# Patient Record
Sex: Female | Born: 1935 | Race: White | Hispanic: No | Smoking: Never smoker
Health system: Southern US, Community
[De-identification: ages and names within clinical notes are randomized; demographics above are authoritative.]

## PROBLEM LIST (undated history)

## (undated) DIAGNOSIS — Z950 Presence of cardiac pacemaker: Secondary | ICD-10-CM

## (undated) DIAGNOSIS — I4891 Unspecified atrial fibrillation: Secondary | ICD-10-CM

## (undated) DIAGNOSIS — D689 Coagulation defect, unspecified: Secondary | ICD-10-CM

## (undated) DIAGNOSIS — I499 Cardiac arrhythmia, unspecified: Secondary | ICD-10-CM

## (undated) DIAGNOSIS — I4892 Unspecified atrial flutter: Secondary | ICD-10-CM

## (undated) DIAGNOSIS — T7840XA Allergy, unspecified, initial encounter: Secondary | ICD-10-CM

## (undated) DIAGNOSIS — IMO0002 Reserved for concepts with insufficient information to code with codable children: Secondary | ICD-10-CM

## (undated) DIAGNOSIS — I2510 Atherosclerotic heart disease of native coronary artery without angina pectoris: Secondary | ICD-10-CM

## (undated) DIAGNOSIS — F32A Depression, unspecified: Secondary | ICD-10-CM

## (undated) DIAGNOSIS — R200 Anesthesia of skin: Secondary | ICD-10-CM

## (undated) DIAGNOSIS — R0600 Dyspnea, unspecified: Secondary | ICD-10-CM

## (undated) DIAGNOSIS — G459 Transient cerebral ischemic attack, unspecified: Secondary | ICD-10-CM

## (undated) DIAGNOSIS — I10 Essential (primary) hypertension: Secondary | ICD-10-CM

## (undated) DIAGNOSIS — F419 Anxiety disorder, unspecified: Secondary | ICD-10-CM

## (undated) DIAGNOSIS — I482 Chronic atrial fibrillation, unspecified: Secondary | ICD-10-CM

## (undated) DIAGNOSIS — G629 Polyneuropathy, unspecified: Secondary | ICD-10-CM

## (undated) DIAGNOSIS — C4490 Unspecified malignant neoplasm of skin, unspecified: Secondary | ICD-10-CM

## (undated) DIAGNOSIS — I509 Heart failure, unspecified: Secondary | ICD-10-CM

## (undated) HISTORY — DX: Heart failure, unspecified: I50.9

## (undated) HISTORY — PX: BREAST SURGERY: SHX581

## (undated) HISTORY — DX: Coagulation defect, unspecified: D68.9

## (undated) HISTORY — PX: OTHER SURGICAL HISTORY: SHX169

## (undated) HISTORY — DX: Allergy, unspecified, initial encounter: T78.40XA

## (undated) HISTORY — PX: ABDOMINAL HYSTERECTOMY: SHX81

## (undated) HISTORY — PX: CHOLECYSTECTOMY: SHX55

## (undated) HISTORY — PX: APPENDECTOMY: SHX54

## (undated) HISTORY — DX: Anesthesia of skin: R20.0

## (undated) HISTORY — DX: Chronic atrial fibrillation, unspecified: I48.20

## (undated) HISTORY — DX: Depression, unspecified: F32.A

## (undated) HISTORY — DX: Polyneuropathy, unspecified: G62.9

---
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
## 1997-10-22 ENCOUNTER — Other Ambulatory Visit: Admission: RE | Admit: 1997-10-22 | Discharge: 1997-10-22 | Payer: Self-pay | Admitting: Internal Medicine

## 2000-07-26 ENCOUNTER — Emergency Department (HOSPITAL_COMMUNITY): Admission: EM | Admit: 2000-07-26 | Discharge: 2000-07-26 | Payer: Self-pay | Admitting: Emergency Medicine

## 2000-07-26 ENCOUNTER — Encounter: Payer: Self-pay | Admitting: Emergency Medicine

## 2002-03-19 DIAGNOSIS — I2510 Atherosclerotic heart disease of native coronary artery without angina pectoris: Secondary | ICD-10-CM

## 2002-03-19 HISTORY — DX: Atherosclerotic heart disease of native coronary artery without angina pectoris: I25.10

## 2003-05-11 ENCOUNTER — Emergency Department (HOSPITAL_COMMUNITY): Admission: EM | Admit: 2003-05-11 | Discharge: 2003-05-11 | Payer: Self-pay | Admitting: Emergency Medicine

## 2004-02-13 ENCOUNTER — Ambulatory Visit: Admission: RE | Admit: 2004-02-13 | Discharge: 2004-02-13 | Payer: Self-pay | Admitting: Internal Medicine

## 2004-05-12 ENCOUNTER — Inpatient Hospital Stay (HOSPITAL_COMMUNITY): Admission: EM | Admit: 2004-05-12 | Discharge: 2004-05-13 | Payer: Self-pay | Admitting: *Deleted

## 2004-05-12 ENCOUNTER — Encounter (INDEPENDENT_AMBULATORY_CARE_PROVIDER_SITE_OTHER): Payer: Self-pay | Admitting: Cardiology

## 2005-01-30 ENCOUNTER — Inpatient Hospital Stay (HOSPITAL_COMMUNITY): Admission: EM | Admit: 2005-01-30 | Discharge: 2005-02-01 | Payer: Self-pay | Admitting: Emergency Medicine

## 2005-07-12 ENCOUNTER — Inpatient Hospital Stay (HOSPITAL_COMMUNITY): Admission: EM | Admit: 2005-07-12 | Discharge: 2005-07-16 | Payer: Self-pay | Admitting: *Deleted

## 2005-07-13 ENCOUNTER — Encounter (INDEPENDENT_AMBULATORY_CARE_PROVIDER_SITE_OTHER): Payer: Self-pay | Admitting: *Deleted

## 2005-08-05 ENCOUNTER — Inpatient Hospital Stay (HOSPITAL_COMMUNITY): Admission: AD | Admit: 2005-08-05 | Discharge: 2005-08-10 | Payer: Self-pay | Admitting: Cardiology

## 2005-08-06 ENCOUNTER — Encounter (INDEPENDENT_AMBULATORY_CARE_PROVIDER_SITE_OTHER): Payer: Self-pay | Admitting: *Deleted

## 2005-08-06 HISTORY — PX: CARDIAC CATHETERIZATION: SHX172

## 2005-10-27 ENCOUNTER — Ambulatory Visit (HOSPITAL_COMMUNITY): Admission: RE | Admit: 2005-10-27 | Discharge: 2005-10-27 | Payer: Self-pay | Admitting: Cardiology

## 2006-01-19 ENCOUNTER — Ambulatory Visit (HOSPITAL_COMMUNITY): Admission: RE | Admit: 2006-01-19 | Discharge: 2006-01-19 | Payer: Self-pay | Admitting: Cardiology

## 2006-03-18 ENCOUNTER — Inpatient Hospital Stay (HOSPITAL_COMMUNITY): Admission: EM | Admit: 2006-03-18 | Discharge: 2006-03-23 | Payer: Self-pay | Admitting: Emergency Medicine

## 2007-05-16 ENCOUNTER — Encounter: Payer: Self-pay | Admitting: Gastroenterology

## 2008-03-19 ENCOUNTER — Inpatient Hospital Stay (HOSPITAL_COMMUNITY): Admission: EM | Admit: 2008-03-19 | Discharge: 2008-03-22 | Payer: Self-pay | Admitting: Emergency Medicine

## 2008-06-17 ENCOUNTER — Encounter: Payer: Self-pay | Admitting: Gastroenterology

## 2008-07-19 ENCOUNTER — Ambulatory Visit: Payer: Self-pay | Admitting: Gastroenterology

## 2008-09-03 DIAGNOSIS — E782 Mixed hyperlipidemia: Secondary | ICD-10-CM | POA: Insufficient documentation

## 2008-09-03 DIAGNOSIS — M1990 Unspecified osteoarthritis, unspecified site: Secondary | ICD-10-CM | POA: Insufficient documentation

## 2008-09-03 DIAGNOSIS — I10 Essential (primary) hypertension: Secondary | ICD-10-CM | POA: Insufficient documentation

## 2008-09-03 DIAGNOSIS — N6019 Diffuse cystic mastopathy of unspecified breast: Secondary | ICD-10-CM | POA: Insufficient documentation

## 2008-09-03 DIAGNOSIS — K219 Gastro-esophageal reflux disease without esophagitis: Secondary | ICD-10-CM | POA: Insufficient documentation

## 2008-09-03 DIAGNOSIS — I2510 Atherosclerotic heart disease of native coronary artery without angina pectoris: Secondary | ICD-10-CM | POA: Insufficient documentation

## 2008-09-04 ENCOUNTER — Ambulatory Visit: Payer: Self-pay | Admitting: Gastroenterology

## 2008-09-10 ENCOUNTER — Ambulatory Visit: Payer: Self-pay | Admitting: Gastroenterology

## 2008-12-26 DIAGNOSIS — R200 Anesthesia of skin: Secondary | ICD-10-CM

## 2008-12-26 HISTORY — DX: Anesthesia of skin: R20.0

## 2009-06-20 ENCOUNTER — Inpatient Hospital Stay (HOSPITAL_COMMUNITY): Admission: EM | Admit: 2009-06-20 | Discharge: 2009-06-22 | Payer: Self-pay | Admitting: Emergency Medicine

## 2009-08-28 ENCOUNTER — Ambulatory Visit (HOSPITAL_COMMUNITY): Admission: RE | Admit: 2009-08-28 | Discharge: 2009-08-28 | Payer: Self-pay | Admitting: Cardiovascular Disease

## 2009-09-03 ENCOUNTER — Encounter: Payer: Self-pay | Admitting: Internal Medicine

## 2009-09-10 ENCOUNTER — Encounter (INDEPENDENT_AMBULATORY_CARE_PROVIDER_SITE_OTHER): Payer: Self-pay | Admitting: *Deleted

## 2009-09-15 ENCOUNTER — Telehealth (INDEPENDENT_AMBULATORY_CARE_PROVIDER_SITE_OTHER): Payer: Self-pay | Admitting: *Deleted

## 2009-10-29 DIAGNOSIS — I509 Heart failure, unspecified: Secondary | ICD-10-CM | POA: Insufficient documentation

## 2009-10-29 HISTORY — DX: Heart failure, unspecified: I50.9

## 2009-11-05 ENCOUNTER — Encounter: Admission: RE | Admit: 2009-11-05 | Discharge: 2009-11-05 | Payer: Self-pay | Admitting: Cardiology

## 2009-11-19 ENCOUNTER — Ambulatory Visit (HOSPITAL_COMMUNITY): Admission: RE | Admit: 2009-11-19 | Discharge: 2009-11-19 | Payer: Self-pay | Admitting: Cardiology

## 2009-11-19 HISTORY — PX: CARDIOVERSION: SHX1299

## 2010-06-04 NOTE — Miscellaneous (Signed)
Summary: Device change out  Clinical Lists Changes  Observations: Added new observation of PPM INDICATN: Sick sinus syndrome (09/03/2009 13:11) Added new observation of MAGNET RTE: BOL 85 ERI 65 (09/03/2009 13:11) Added new observation of PPMLEADSTAT2: active (09/03/2009 13:11) Added new observation of PPMLEADSER2: [id number] (09/03/2009 13:11) Added new observation of PPMLEADMOD2: 4092  (09/03/2009 13:11) Added new observation of PPMLEADDOI2: 03/22/2006  (09/03/2009 13:11) Added new observation of PPMLEADLOC2: RV  (09/03/2009 13:11) Added new observation of PPMLEADSTAT1: active  (09/03/2009 13:11) Added new observation of PPMLEADSER1: [id number]  (09/03/2009 13:11) Added new observation of PPMLEADMOD1: 4592  (09/03/2009 13:11) Added new observation of PPMLEADDOI1: 03/22/2006  (09/03/2009 13:11) Added new observation of PPMLEADLOC1: RA  (09/03/2009 13:11) Added new observation of PPM IMP MD: Sherryl Manges, MD  (09/03/2009 13:11) Added new observation of PPM DOI: 08/28/2009  (09/03/2009 13:11) Added new observation of PPM SERL#: [id number]  (09/03/2009 13:11) Added new observation of PPM MODL#: [id number]  (09/03/2009 08:65) Added new observation of PACEMAKERMFG: Medtronic  (09/03/2009 13:11) Added new observation of PACEMAKER MD: Sherryl Manges, MD  (09/03/2009 13:11)      PPM Specifications Following MD:  Sherryl Manges, MD     PPM Vendor:  Medtronic     PPM Model Number:  [id number]     PPM Serial Number:  [id number] PPM DOI:  08/28/2009     PPM Implanting MD:  Sherryl Manges, MD  Lead 1    Location: RA     DOI: 03/22/2006     Model #: [id number]     Serial #: [id number]     Status: active Lead 2    Location: RV     DOI: 03/22/2006     Model #: [id number]     Serial #: [id number]     Status: active  Magnet Response Rate:  BOL 85 ERI 65  Indications:  Sick sinus syndrome

## 2010-06-04 NOTE — Letter (Signed)
Summary: Device-Delinquent Check  [facility], Main Office  [address]   [place], [place] [address]   Phone: [phone]  Fax: [phone]     Sep 10, 2009 MRN: [id number]   [facility] [address] [place], [place]  [address]   Dear Ms. Eid,  According to our records, you have not had your implanted device checked in the recommended period of time.  We are unable to determine appropriate device function without checking your device on a regular basis.  Please call our office to schedule an appointment as soon as possible.  If you are having your device checked by another physician, please call us so that we may update our records.  Thank you,  Altha Harm, LPN  Sep 10, 2009 8:13 AM  [facility]

## 2010-06-04 NOTE — Progress Notes (Signed)
  Phone Note Other Incoming   Summary of Call: Pt called to let us know she gets device checked at [facility].   Initial call taken by: Vella Kohler,  Sep 15, 2009 1:58 PM

## 2010-07-18 LAB — PROTIME-INR
INR: 2.31 — ABNORMAL HIGH (ref 0.00–1.49)
Prothrombin Time: 25.2 seconds — ABNORMAL HIGH (ref 11.6–15.2)

## 2010-07-21 LAB — PROTIME-INR: INR: 1.12 (ref 0.00–1.49)

## 2010-07-22 LAB — CARDIAC PANEL(CRET KIN+CKTOT+MB+TROPI)
CK, MB: 1 ng/mL (ref 0.3–4.0)
Relative Index: INVALID (ref 0.0–2.5)
Relative Index: INVALID (ref 0.0–2.5)
Total CK: 48 U/L (ref 7–177)
Troponin I: 0.01 ng/mL (ref 0.00–0.06)

## 2010-07-22 LAB — CBC
HCT: 41.6 % (ref 36.0–46.0)
HCT: 42.3 % (ref 36.0–46.0)
Hemoglobin: 13.2 g/dL (ref 12.0–15.0)
Hemoglobin: 14.3 g/dL (ref 12.0–15.0)
MCHC: 33.2 g/dL (ref 30.0–36.0)
MCV: 95.4 fL (ref 78.0–100.0)
Platelets: 255 10*3/uL (ref 150–400)
Platelets: 285 10*3/uL (ref 150–400)
Platelets: 293 10*3/uL (ref 150–400)
RDW: 13.5 % (ref 11.5–15.5)
RDW: 13.5 % (ref 11.5–15.5)
WBC: 9.3 10*3/uL (ref 4.0–10.5)

## 2010-07-22 LAB — COMPREHENSIVE METABOLIC PANEL
ALT: 18 U/L (ref 0–35)
AST: 27 U/L (ref 0–37)
Albumin: 3.2 g/dL — ABNORMAL LOW (ref 3.5–5.2)
Albumin: 3.7 g/dL (ref 3.5–5.2)
Alkaline Phosphatase: 77 U/L (ref 39–117)
Alkaline Phosphatase: 99 U/L (ref 39–117)
BUN: 10 mg/dL (ref 6–23)
Calcium: 8.8 mg/dL (ref 8.4–10.5)
Chloride: 104 mEq/L (ref 96–112)
Creatinine, Ser: 0.73 mg/dL (ref 0.4–1.2)
Creatinine, Ser: 0.77 mg/dL (ref 0.4–1.2)
GFR calc Af Amer: >60 mL/min (ref >60)
GFR calc Af Amer: >60 mL/min (ref >60)
GFR calc non Af Amer: >60 mL/min (ref >60)
Glucose, Bld: 100 mg/dL — ABNORMAL HIGH (ref 70–99)
Glucose, Bld: 108 mg/dL — ABNORMAL HIGH (ref 70–99)
Potassium: 3 mEq/L — ABNORMAL LOW (ref 3.5–5.1)
Sodium: 139 mEq/L (ref 135–145)
Sodium: 139 mEq/L (ref 135–145)
Total Bilirubin: 0.5 mg/dL (ref 0.3–1.2)
Total Bilirubin: 0.5 mg/dL (ref 0.3–1.2)
Total Protein: 6.2 g/dL (ref 6.0–8.3)
Total Protein: 6.6 g/dL (ref 6.0–8.3)
Total Protein: 7.2 g/dL (ref 6.0–8.3)

## 2010-07-22 LAB — DIFFERENTIAL
Lymphocytes Relative: 31 % (ref 12–46)
Lymphs Abs: 2.7 10*3/uL (ref 0.7–4.0)
Monocytes Absolute: 1 10*3/uL (ref 0.1–1.0)
Neutrophils Relative %: 55 % (ref 43–77)

## 2010-07-22 LAB — PROTIME-INR
INR: 1.87 — ABNORMAL HIGH (ref 0.00–1.49)
INR: 1.91 — ABNORMAL HIGH (ref 0.00–1.49)
Prothrombin Time: 21.1 seconds — ABNORMAL HIGH (ref 11.6–15.2)
Prothrombin Time: 21.7 seconds — ABNORMAL HIGH (ref 11.6–15.2)

## 2010-07-22 LAB — APTT: aPTT: 33 seconds (ref 24–37)

## 2010-07-22 LAB — LIPID PANEL
HDL: 34 mg/dL — ABNORMAL LOW (ref >39)
HDL: 36 mg/dL — ABNORMAL LOW (ref >39)
Triglycerides: 93 mg/dL (ref <150)
VLDL: 19 mg/dL (ref 0–40)
VLDL: 20 mg/dL (ref 0–40)

## 2010-07-22 LAB — MAGNESIUM: Magnesium: 2.2 mg/dL (ref 1.5–2.5)

## 2010-07-22 LAB — TSH: TSH: 0.895 u[IU]/mL (ref 0.350–4.500)

## 2010-08-25 ENCOUNTER — Emergency Department (HOSPITAL_COMMUNITY): Payer: Medicare Other

## 2010-08-25 ENCOUNTER — Observation Stay (HOSPITAL_COMMUNITY)
Admission: EM | Admit: 2010-08-25 | Discharge: 2010-08-26 | Disposition: A | Discharge status: Home / Self Care | Payer: Medicare Other | Attending: Cardiology | Admitting: Cardiology

## 2010-08-25 DIAGNOSIS — I4891 Unspecified atrial fibrillation: Secondary | ICD-10-CM | POA: Insufficient documentation

## 2010-08-25 DIAGNOSIS — Z950 Presence of cardiac pacemaker: Secondary | ICD-10-CM | POA: Insufficient documentation

## 2010-08-25 DIAGNOSIS — Z7901 Long term (current) use of anticoagulants: Secondary | ICD-10-CM | POA: Insufficient documentation

## 2010-08-25 DIAGNOSIS — Z8673 Personal history of transient ischemic attack (TIA), and cerebral infarction without residual deficits: Secondary | ICD-10-CM | POA: Insufficient documentation

## 2010-08-25 DIAGNOSIS — E785 Hyperlipidemia, unspecified: Secondary | ICD-10-CM | POA: Insufficient documentation

## 2010-08-25 DIAGNOSIS — I517 Cardiomegaly: Secondary | ICD-10-CM | POA: Insufficient documentation

## 2010-08-25 DIAGNOSIS — R079 Chest pain, unspecified: Principal | ICD-10-CM | POA: Insufficient documentation

## 2010-08-25 DIAGNOSIS — F410 Panic disorder [episodic paroxysmal anxiety] without agoraphobia: Secondary | ICD-10-CM | POA: Insufficient documentation

## 2010-08-25 DIAGNOSIS — J438 Other emphysema: Secondary | ICD-10-CM | POA: Insufficient documentation

## 2010-08-25 DIAGNOSIS — I495 Sick sinus syndrome: Secondary | ICD-10-CM | POA: Insufficient documentation

## 2010-08-25 LAB — POCT CARDIAC MARKERS
CKMB, poc: <1 ng/mL — ABNORMAL LOW (ref 1.0–8.0)
Troponin i, poc: <0.05 ng/mL (ref 0.00–0.09)

## 2010-08-25 LAB — APTT: aPTT: 30 seconds (ref 24–37)

## 2010-08-25 LAB — HEPATIC FUNCTION PANEL
Albumin: 3.9 g/dL (ref 3.5–5.2)
Alkaline Phosphatase: 119 U/L — ABNORMAL HIGH (ref 39–117)
Total Bilirubin: 0.8 mg/dL (ref 0.3–1.2)

## 2010-08-25 LAB — CBC
MCH: 31.5 pg (ref 26.0–34.0)
MCV: 92.6 fL (ref 78.0–100.0)
Platelets: 248 10*3/uL (ref 150–400)
RDW: 13.4 % (ref 11.5–15.5)

## 2010-08-25 LAB — DIFFERENTIAL
Eosinophils Absolute: 0.1 10*3/uL (ref 0.0–0.7)
Eosinophils Relative: 1 % (ref 0–5)
Lymphs Abs: 5.3 10*3/uL — ABNORMAL HIGH (ref 0.7–4.0)
Monocytes Absolute: 1 10*3/uL (ref 0.1–1.0)
Monocytes Relative: 8 % (ref 3–12)

## 2010-08-25 LAB — POCT I-STAT, CHEM 8
BUN: 14 mg/dL (ref 6–23)
Calcium, Ion: 1 mmol/L — ABNORMAL LOW (ref 1.12–1.32)
Chloride: 103 mEq/L (ref 96–112)
Glucose, Bld: 109 mg/dL — ABNORMAL HIGH (ref 70–99)

## 2010-08-25 LAB — DIGOXIN LEVEL: Digoxin Level: 1 ng/mL (ref 0.8–2.0)

## 2010-08-26 LAB — TSH: TSH: 0.672 u[IU]/mL (ref 0.350–4.500)

## 2010-08-26 LAB — BASIC METABOLIC PANEL
BUN: 11 mg/dL (ref 6–23)
CO2: 30 mEq/L (ref 19–32)
Chloride: 103 mEq/L (ref 96–112)
Creatinine, Ser: 0.77 mg/dL (ref 0.4–1.2)
Glucose, Bld: 98 mg/dL (ref 70–99)

## 2010-08-26 LAB — CBC
HCT: 41.8 % (ref 36.0–46.0)
MCHC: 33.5 g/dL (ref 30.0–36.0)
MCV: 92.5 fL (ref 78.0–100.0)
RDW: 13.4 % (ref 11.5–15.5)

## 2010-08-26 LAB — PROTIME-INR: INR: 1.83 — ABNORMAL HIGH (ref 0.00–1.49)

## 2010-09-07 NOTE — Discharge Summary (Signed)
Misty Blackwell, Misty Blackwell                ACCOUNT NO.:  [id number]  MEDICAL RECORD NO.:  [id number]           PATIENT TYPE:  O  LOCATION:  3704                         FACILITY:  [facility]  PHYSICIAN:  Dr. Rennis Golden              DATE OF BIRTH:  01-05-1928  DATE OF ADMISSION:  08/25/2010 DATE OF DISCHARGE:  08/26/2010                              DISCHARGE SUMMARY   DISCHARGE DIAGNOSES: 1. Chest pain, negative cardiac enzymes x3. 2. Anxiety attack. 3. History of paroxysmal atrial fibrillation, sick sinus syndrome. 4. History of pacemaker implant in 2007 with generator change in April     2011 to Medtronic device. 5. Intolerance to AMIODARONE. 6. Coumadin therapy. 7. Past transient ischemic attack. 8. History of dyslipidemia. 9. History of preserved left ventricular function.  HOSPITAL COURSE:  Misty Blackwell is an 77 year old female who was seen by Dr. Clarene Duke and Dr. Milinda Cave.  She has a history of paroxysmal atrial fibrillation, sick sinus syndrome, pacemaker implantation in 2007, generator change to Medtronic device in April 2011.  She is intolerant to AMIODARONE.  She has a history of prior TIAs.  She also takes Coumadin.  The patient came in to [facility] ED, appearing anxious and depressed due to issues at home.  She also complained of chest pressure. Initial cardiac enzymes were negative and her EKG showed a paced rhythm. She was admitted for observation and to cycle cardiac enzymes.  She is currently chest pain free.  Chest x-ray showed stable cardiomegaly, COPD/emphysema.  No acute cardiopulmonary disease.  She is currently chest pain free.  She been seen by Dr. Rennis Golden, feels she is stable for discharge and for followup with Dr. Clarene Duke and also recommended that she follows up with her primary care doctor regarding her anxiety.  DISCHARGE LABORATORY FINDINGS:  WBC is 9.1, hemoglobin 14.4, hematocrit 41.8, platelets 214.  PT was 21.3, INR 1.83.  Sodium 143, potassium 3.6, chloride 103,  carbon dioxide 30, glucose 98, BUN 11, creatinine 0.77, total bilirubin was 0.8, direct bilirubin was 0.2, indirect 0.6, alkaline phosphatase was 1.19, AST 26, ALT 25, total protein 6.9, albumin 3.9, calcium 8.8, magnesium 2.0, CK-MB negative x3.  Thyroid stimulating hormone 0.672 and digoxin level was 1.0.  STUDIES/PROCEDURES:  Chest x-ray showed stable cardiomegaly. COPD/emphysema.  No acute cardiopulmonary disease.  DISCHARGE MEDICATIONS: 1. Nitroglycerin sublingual 0.4 mg 1 tablet under the tongue every 5     minutes up to 3 doses total for chest pain. 2. Alprazolam one and half to one tablet up to 2 tablets by mouth     daily at bedtime as needed for anxiety. 3. Aspirin 81 mg 1 tablet by mouth every morning. 4. Bystolic 10 mg one-half tablet by mouth every morning. 5. Digoxin 0.125 mg 1 tablet by mouth every morning. 6. Diltiazem CD 360 mg 1 capsule by mouth every morning. 7. Fish oil 1 tablet by mouth daily. 8. Furosemide 40 mg 2 tablets by mouth every morning. 9. Klor-Con 20 mEq 1 tablet by mouth daily. 10.Pravachol 40 mg one-half tablet by mouth daily at bedtime. 11.Vitamin D 4000 units  3 capsules by mouth daily. 12.Warfarin 6 mg one-half tablet by mouth every evening.  DISPOSITION:  Misty Blackwell discharged home in stable condition.  Recommend she increase her activity slowly and eat a heart-healthy diet.  She will follow up with Dr. Clarene Duke in approximately 1-2 weeks.  Recommended she follows up with her primary care doctor regarding her anxiety.    ______________________________ Wilburt Finlay, PA   ______________________________ Dr. Rennis Golden    BH/MEDQ  D:  08/26/2010  T:  08/27/2010  Job:  [id number]  cc:   Dr. Milinda Cave  Electronically Signed by Wilburt Finlay PA on 08/28/2010 11:24:17 AM Electronically Signed by Kirtland Bouchard. HILTY M.D. on 09/07/2010 12:36:49 PM

## 2010-09-08 NOTE — H&P (Signed)
NAMEJORDIE, Misty Blackwell                ACCOUNT NO.:  [id number]  MEDICAL RECORD NO.:  [id number]           PATIENT TYPE:  E  LOCATION:  [facility]                         FACILITY:  [facility]  PHYSICIAN:  Dr. Allyson Sabal              DATE OF BIRTH:  02/11/1928  DATE OF ADMISSION:  08/25/2010 DATE OF DISCHARGE:                             HISTORY & PHYSICAL   CHIEF COMPLAINTS:  Chest pressure.  HISTORY OF PRESENT ILLNESS:  Ms. Bugh is an 77 year old female followed by Dr. Clarene Duke and Dr. Michele Mcalpine.  She has a history of paroxysmal atrial fibrillation and sick sinus syndrome.  She had a pacemaker implanted in 2007.  She had a generator change with a Medtronic device in April 2011.  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  She is on Coumadin.  She has had prior TIAs.  She is now off amiodarone, but seems to be doing well.  When she saw Dr. Clarene Duke in February, she had some brief runs of atrial fibrillation associated with stress.  She is admitted to the emergency room tonight with chest pressure.  She was at home visiting with a friend, who came to visit her because Ms. Zettler has been depressed and anxious lately.  They did not go into details, but apparently there were some issues at home, her husband has been in an accident for the fourth time this year already.  The patient became upset and tearful and started "shaking all over."  She then complained of chest pressure.  She was brought to the emergency room.  Her enzymes were negative.  Her EKG shows a paced rhythm.  She is currently chest pain-free, but feels "washed out."  We will go ahead and admit her overnight for further observation.  PAST MEDICAL HISTORY:  Remarkable for previous catheterizations in 1993 and November 2007 that showed a minor RCA disease and some systolic kinking in the LAD with no significant coronary disease.  She has had good LV function in the past.   She has a history of prior TIAs.  She has dyslipidemia.  She has had normal LV function in the past.  She has had a cholecystectomy in 2007.  She has had previous problems with anxiety, depression, she says she has been intolerant to antidepressant medicines.  CURRENT MEDICATIONS:  As per Dr. Fredirick Maudlin office note from February 2012, 1. Coumadin. 2. Aspirin 81 mg a day. 3. Bystolic. 4. Cardizem 360 mg a day. 5. Lanoxin 0.125 mg a day. 6. Xanax p.r.n. 7. Pravastatin 20 mg a day. 8. Lasix p.r.n. 9. Potassium 20 mEq a day. 10.Vitamin D. 11.Fish oil. 12.Over-the-counter iron.  ALLERGIES:  She has no known drug allergies.  SHE WAS INTOLERANT TO AMIODARONE, WHICH CAUSED ATAXIA.  SHE ALSO SAYS SHE IS INTOLERANT TO ANTIDEPRESSANTS.  SOCIAL HISTORY:  She is married, they have no children.  She is a nonsmoker and nondrinker.  FAMILY HISTORY:  Remarkable that her sister has had a pacemaker.  One sister died of cirrhosis.  Both her parents apparently  had some coronary disease.  REVIEW OF SYSTEMS:  Essentially unremarkable except for noted above.  PHYSICAL EXAM:  VITAL SIGNS:  Blood pressure 136/80, pulse 60, temperature 97.4. GENERAL:  She is a well-developed elderly female, who is anxious, but in no acute distress. HEENT:  Normocephalic.  Extraocular movements are intact.  Sclerae was nonicteric.  Lids and conjunctivae within normal limits. NECK:  Without JVD or bruit. CHEST:  Clear to auscultation and percussion. CARDIAC:  Reveals regular rate and rhythm without murmur, rub, or gallop.  Normal S1 and S2. ABDOMEN:  Nontender and nondistended. EXTREMITIES:  Without edema.  Distal pulses are 2+/4 bilaterally. NEURO:  Grossly intact.  She is awake, alert, oriented, and cooperative. Moves all extremities without obvious deficit. SKIN:  Cool and dry.  LABORATORY DATA:  White count 11.7, hemoglobin 15, hematocrit 44.1, platelets 248, INR 1.79.  Sodium 142, potassium 3.3, BUN 15,  creatinine 0.9, troponins were negative x2.  Chest x-ray shows COPD, but no acute process.  EKG shows atrial fibrillation with V pacing.  IMPRESSION: 1. Chest pressure, rule out cardiac. 2. Anxiety attack. 3. History of paroxysmal atrial fibrillation and sick sinus syndrome. 4. History of pacemaker implant in 2007 with a generator change, April     2011 with Medtronic device. 5. Amiodarone intolerance. 6. Coumadin therapy. 7. Past transient ischemic attack. 8. History of dyslipidemia. 9. History of preserved LV function.  PLAN:  The patient was seen by Dr. Allyson Sabal and myself today in the emergency room.  We will go ahead and check another set of enzymes in the morning.  She will be admitted for 24-hour observation.     Abelino Derrick, P.A.   ______________________________ Dr. Evalee Mutton  D:  08/25/2010  T:  08/25/2010  Job:  [id number]  cc:   Thereasa Solo. Little, M.D.  Electronically Signed by Corine Shelter P.A. on 09/02/2010 04:37:08 PM Electronically Signed by Nanetta Batty M.D. on 09/08/2010 07:39:57 AM

## 2010-09-15 NOTE — Discharge Summary (Signed)
Misty Blackwell, PROUD                ACCOUNT NO.:  [id number]   MEDICAL RECORD NO.:  [id number]          PATIENT TYPE:  INP   LOCATION:  5532                         FACILITY:  [facility]   PHYSICIAN:  Elliot Cousin, M.D.    DATE OF BIRTH:  [birth year]   DATE OF ADMISSION:  03/18/2008  DATE OF DISCHARGE:                               DISCHARGE SUMMARY   DISCHARGE DIAGNOSES:  1. Multifocal pneumonia.  2. Hypokalemia.  3. Chronic atrial fibrillation.  4. Hypertension.   DISCHARGE MEDICATIONS:  1. Tamiflu 75 mg b.i.d. for one more day.  2. Ceftin 500 mg b.i.d. for four more days.  3. Azithromycin 500 mg daily for four more days.  4. Potassium chloride 20 mEq daily.  5. Coumadin 3 mg every day except on Tuesday and Saturday take 6 mg.  6. Amlodipine 10 mg daily.  7. Aspirin 81 mg daily.  8. Sotalol 160 mg b.i.d.  9. Alprazolam 0.5 mg q.h.s.  10.Fish oil 1000 mg b.i.d.  11.Niacin 500 mg daily.   DISCHARGE DISPOSITION:  The patient is approaching medical stability.  The plan is to discharge her to home tomorrow on March 22, 2008 if  she remains stable and in improved condition overnight.  The patient was  advised to follow up with her primary care physician, Dr. Oneta Rack, in 1  week and with Dr. Julieanne Manson in 1-2 weeks.   CONSULTATIONS:  None.   PROCEDURES PERFORMED:  1. Chest x-ray on March 21, 2008.  The results revealed improved      nodular air space opacities.  2. Chest x-ray on March 18, 2008.  The results revealed patchy      bilateral opacities, most consistent with multifocal pneumonia.      Recommend radiographic follow up in 4-6 weeks.   HISTORY OF PRESENT ILLNESS:  The patient is a 77 year old woman with a  past medical history significant for chronic atrial fibrillation,  tachybrady syndrome, status post pacemaker, and hypertension.  She  presented to the emergency department on March 18, 2008 with a chief  complaint of shortness of breath and fever.  When  she was evaluated in  the emergency department, she was noted to be febrile with a temperature  of 102.3.  Her white blood cell count was elevated at 16.8.  The chest x-  ray revealed bilateral patchy opacities consistent with multifocal  pneumonia.  The patient was therefore admitted for further evaluation  and management.   For additional details please see the dictated history and physical.   HOSPITAL COURSE:  1. MULTIFOCAL PNEUMONIA.  Blood cultures were ordered.  The patient      was placed on droplet precautions.  Antibiotic treatment was      started with Rocephin and azithromycin.  Empiric antiviral      treatment was started with Tamiflu.  In addition to blood cultures,      a sputum culture was ordered.  However, the specimen that the      patient provided was not representative of the lower respiratory      tract.  The patient could  provide no further sputum although she      tried.  Her blood cultures remained negative during the      hospitalization. She became afebrile and remained afebrile for the      majority of the hospitalization.  Her white blood cell count has      improved to 11.4.  She is currently symptomatically improved.  A      follow-up chest x-ray today revealed some resolution of the air      space opacities.  A follow-up chest x-ray may be warranted in 4-6      weeks to assess for complete resolution.  As of today, she has      completed 3 days of therapy with Rocephin, azithromycin, and      Tamiflu.  The plan is to discharge her to home tomorrow on four      more days of therapy with Ceftin and azithromycin and one more day      with Tamiflu (after receiving tomorrow's treatment).  She is      currently oxygenating 93% on room air.  2. CHRONIC ATRIAL FIBRILLATION.  The  patient was maintained on      sotalol and Coumadin.  The pharmacist assisted with dosing of the      Coumadin.  Per the pharmacist's recommendation, the patient will be      discharged  to home on a slightly different regimen of 3 mg every      day except 6 mg on Tuesdays and Saturdays.  The patient was advised      to follow up with Dr. Clarene Duke accordingly.  As of today, her INR is      1.8.  Her heart rate has been well-controlled during the      hospitalization.  3. HYPERTENSION.  The patient's blood pressure has been well-      controlled during the entire hospitalization.  4. HYPOKALEMIA. The patient's serum potassium was 3.3 at the time of      the initial hospital assessment.  She was started on potassium      chloride repletion orally.  As of today, her serum potassium has      improved marginally to 3.4.  She will receive two doses today and      will be discharged home on daily potassium supplementation.  A      magnesium level was ordered and it was within normal limits at 2.1.   DISCHARGE LABORATORY DATA:  TSH 0.365 (0.350 to 4.5 within normal  limits).  Total cholesterol 181, triglycerides 82, HDL 38, LDL 127.  Urine culture insignificant growth.      Elliot Cousin, M.D.  Electronically Signed     DF/MEDQ  D:  03/21/2008  T:  03/21/2008  Job:  [id number]   cc:   Thereasa Solo. Little, M.D.  Lucky Cowboy, M.D.

## 2010-09-15 NOTE — H&P (Signed)
Misty Blackwell, Misty Blackwell                ACCOUNT NO.:  [id number]   MEDICAL RECORD NO.:  [id number]          PATIENT TYPE:  INP   LOCATION:  5532                         FACILITY:  [facility]   PHYSICIAN:  Eduard Clos, MDDATE OF BIRTH:  1927-09-17   DATE OF ADMISSION:  03/18/2008  DATE OF DISCHARGE:                              HISTORY & PHYSICAL   PRIMARY CARE PHYSICIAN:  Lucky Cowboy, M.D.   PRIMARY CARDIOLOGIST:  Thereasa Solo. Little, M.D.   CHIEF COMPLAINT:  Shortness of breath.   HISTORY OF PRESENT ILLNESS:  A 77 year old female with a history of  chronic atrial fibrillation, tachy-brady syndrome, status post  pacemaker, on Coumadin, hypertension, hyperlipidemia, presented to the  ER complaining of increasing shortness of breath over the last 3 days.  The patient also has been noticing fever.  In the ER, the patient had a  fever of 102 and chest x-ray compatible with multilobar pneumonia.  The  patient has been admitted for further management.  The patient's  shortness of breath is present at rest, increases with exertion.  He  denies any chest pain.  He has been having cough with productive sputum.  Denies any palpitations, dizziness, loss of consciousness, weakness.  Complains of abdominal pain, nausea, vomiting.  He does have dysuria.  He denies any diarrhea.   PAST MEDICAL HISTORY:  1. Chronic atrial fibrillation.  2. Tachy-brady syndrome.  3. Status post pacemaker placement, on Coumadin.  4. Hypertension.  5. Hyperlipidemia.   PAST SURGICAL HISTORY:  1. Pacemaker placement.  2. Cholecystectomy.   MEDICATIONS PRIOR TO ADMISSION:  1. Coumadin 6 mg on Friday and 3 mg on other days.  2. Amlodipine 10 mg p.o. daily.  3. Aspirin 81 mg p.o. daily.  4. Sotalol 160 mg p.o. b.i.d.  5. Alprazolam 0.5 mg p.o. at bedtime.  6. Fish oil 1000 mg p.o. b.i.d.  7. Niacin 500 mg p.o. daily.   ALLERGIES:  No known drug allergies.   FAMILY HISTORY:  Nothing contributory.   SOCIAL  HISTORY:  Patient denies smoking cigarettes, drinking alcohol,  using illegal drugs.   REVIEW OF SYSTEMS:  As per the history of present illness, nothing else  significant.   PHYSICAL EXAMINATION:  Patient examined at bedside.  Not in acute  distress.  VITAL SIGNS:  Blood pressure is 118/73, pulse 78 per minute, temperature  102.3, respirations 18 per minute, O2 sat 97%.  HEENT:  Anicteric.  No pallor.  CHEST:  Bilateral air entry present bilaterally.  No rhonchi, no  crepitation.  HEART:  S1 and S2 heard.  ABDOMEN:  Soft.  Nontender.  Bowel sounds heard.  No guarding, no  rigidity.  CNS:  Awake, alert and oriented to time, place, and person.  Moves upper  and lower extremities 5/5.  EXTREMITIES:  Peripheral pulses felt.  No edema.   LABS:  Chest x-ray:  Bilateral patchy opacities, most consistent with  multifocal pneumonia.  Followup recommended in 4-6 weeks.   CBC:  WBCs 16.8, hemoglobin 15, hematocrit 44, platelets 237.  Basic  metabolic panel:  Sodium 138, potassium 3.3, chloride 99,  glucose 113,  BUN 15, creatinine 0.9.  UA showing small bilirubin, ketones 15,  moderate blood, nitrites positive, leukocytes large, WBCs too-numerous-  to-count.  Cultures pending.   ASSESSMENT:  1. Pneumonia, multilobar, community-acquired.  2. Urinary tract infection.  3. Chronic atrial fibrillation, status post pacemaker placement on      Coumadin, rate controlled.  4. Hypertension.  5. Hyperlipidemia.   PLAN:  Admit patient to telemetry.  Will start the patient on IV  antibiotics, Coumadin per pharmacy protocol.  Follow up blood cultures,  sputum cultures, urine cultures.  Place patient on droplet precautions,  on Tamiflu.  Further recommendations as patient's condition evolves.      Eduard Clos, MD  Electronically Signed     ANK/MEDQ  D:  03/19/2008  T:  03/19/2008  Job:  [id number]

## 2010-09-18 NOTE — H&P (Signed)
Misty Blackwell, Misty Blackwell                ACCOUNT NO.:  [id number]   MEDICAL RECORD NO.:  [id number]           PATIENT TYPE:   LOCATION:                               FACILITY:  [facility]   PHYSICIAN:  Pramod P. Pearlean Brownie, MD    DATE OF BIRTH:  02-14-1928   DATE OF ADMISSION:  01/30/2005  DATE OF DISCHARGE:                                HISTORY & PHYSICAL   REASON FOR ADMISSION:  Code stroke.   HISTORY OF PRESENT ILLNESS:  Misty Blackwell is a 77 year old Caucasian lady who  developed sudden onset of slurred speech, right facial numbness, and  dysphagia at 10:45 a.m. today while in a store on [employer].  Her  sister, who was shopping with her, noticed this and immediately called EMS  and she was brought to the emergency room.  By the time she was seen by the  triage nurse, the patient's symptoms were resolving.  She was taken to CT  scan and upon returning she complained of again slurred speech and right  facial numbness.  I saw the patient immediately and the CT scan of the head  was read by me as unremarkable.  The patient had some word-finding  difficulties, and mild slurred speech, and subjective right facial numbness  but clearly had improved and hence, was not a candidate for IV thrombolysis  or aggressive intervention.  She denies any headache, slurred speech, focal  extremity weakness, numbness, gait or balance problems.   PAST NEUROLOGIC HISTORY:  Left hemispheric TIA in January 2006.  At that  time an MRI scan was unremarkable.  She was started on aspirin for secondary  stroke prevention and advised to maintain strict control of her hypertension  and hyperlipidemia.  She has done well since then.  She was, however, seen  by her primary physician, Dr. Milinda Cave, a week ago and for unclear reason  aspirin was added to her Plavix.   PAST MEDICAL HISTORY:  1.  Hypertension.  2.  Hyperlipidemia.  3.  TIA.   MEDICATION LIST:  1.  Aspirin 81 mg a day, started one week ago.  2.  Plavix 75  mg a day.  3.  Hydrochlorothiazide 25 mg a day.  4.  Benicar 40 mg, half a tablet daily.  5.  Zoloft 100 mg, half a tablet daily.  6.  Xanax 0.5 mg, half a tablet in the morning, one at night.  7.  Zocor 20 mg daily.  8.  Atenolol 100 mg at night.  9.  Plavix 75 mg daily.   MEDICATION ALLERGIES:  None.   PAST SURGICAL HISTORY:  None recently.   REVIEW OF SYSTEMS:  Not significant for any chest pain, fever, cough,  shortness of breath, diarrhea, or other illness.   SOCIAL HISTORY:  The patient is retired.  She lives with her husband in  [place].  She does not smoke or drink.   PHYSICAL EXAMINATION:  GENERAL:  Reveals a pleasant, elderly, Caucasian lady  who is not in distress.  VITAL SIGNS:  She is afebrile.  Pulse rate is 60 per minute regular.  Blood  pressure 130/70.  Respiratory rate 18 per minute.  Distal pulses well felt.  SKIN:  There are a few subacute bruises beneath the skin.  HEAD:  Nontraumatic.  NECK:  Supple without bruit.  ENT:  Unremarkable.  CARDIAC:  No murmur or gallop.  LUNGS:  Clear to auscultation.  NEUROLOGIC:  She is pleasant, awake, alert, cooperative.  There is no  aphasia.  She has minimal dysarthria and occasional word-finding  difficulties, but she can name, repeat, comprehend quite well.  Eye  movements are full range without nystagmus.  Face is symmetric __________  movements are normal.  Tongue is midline.  MOTOR:  Reveals no upper extremity drift.  Symmetric strength, tone  __________  coordination, sensation.  ABDOMEN:  Soft nontender.   DATA REVIEWED:  CT scan of the head, done today, reveals no acute  abnormality.  White matter age appropriate changes are noted.  Previous  discharge summary, from January 2006, was reviewed.   IMPRESSION:  A 77 year old lady with hypertension, hyperlipidemia, and  previous history of transient ischemic attack presents with fluctuating  symptoms of slurred speech, right facial numbness, and dysphagia  likely due  to a small left subcortical or brain stem transient ischemic attack versus  small infarct.   PLAN:  1.  Admit the patient to the stroke service for further workup.  2.  Her neurological deficits are not significant enough to justify using IV      thrombolysis or more aggressive intervention.  3.  Start her on IV heparin due to the fluctuating nature of the symptoms      till she stabilizes.  4.  Check MRI scan of the brain with MRA of the brain.  5.  Fasting lipid profile and hemoglobin A1c and homocystine.  6.  Continue on home medications, except hold aspirin and Plavix.  7.  She will be started on Aggrenox for secondary stroke prevention.   I had a long discussion with the patient and her sister regarding her  symptoms, discussed plan for evaluation, and answered questions.           ______________________________  Sunny Schlein. Pearlean Brownie, MD     PPS/MEDQ  D:  01/30/2005  T:  01/30/2005  Job:  [id number]   cc:   Jeoffrey Massed, MD  Fax: [phone]

## 2010-09-18 NOTE — Op Note (Signed)
NAMEARLISSA, Misty Blackwell                ACCOUNT NO.:  [id number]   MEDICAL RECORD NO.:  [id number]          PATIENT TYPE:  INP   LOCATION:  1429                         FACILITY:  [facility]   PHYSICIAN:  Sharlet Salina T. Blackwell, M.D.DATE OF BIRTH:  10/21/1927   DATE OF PROCEDURE:  07/13/2005  DATE OF DISCHARGE:                                 OPERATIVE REPORT   PRE-AND-POSTOPERATIVE DIAGNOSIS:  Cholelithiasis and cholecystitis.   SURGICAL PROCEDURES:  Laparoscopic cholecystectomy with intraoperative  cholangiogram.   SURGEON:  Sharlet Salina T. Blackwell, M.D.   ASSISTANT:  Sandria Bales. Ezzard Standing, M.D.   ANESTHESIA:  General.   BRIEF HISTORY:  Misty Blackwell is a 77 year old female who presents with 1-2  weeks of worsening persistent epigastric right upper quadrant abdominal pain  and nausea. She has had an elevated white count with marked tenderness in  the right upper quadrant and a gallbladder ultrasound shows multiple  gallstones. She is felt to have cholecystitis and laparoscopic  cholecystectomy with cholangiogram has been recommended. The nature of the  procedure, indications, risks of bleeding, infection, bile leak, bile duct  injury were discussed and understood. She is now brought to the operating  room for this procedure.   DESCRIPTION OF OPERATION:  The patient was brought to the operating room and  placed in the supine position on the operating room table and general  endotracheal anesthesia was induced. The abdomen was widely sterilely  prepped and draped. She is on broad-spectrum antibiotics. Correct patient  and procedure were verified. Local anesthesia was used to infiltrate the  trocar sites prior to the incisions. A 1-cm incision was made at the  umbilicus and dissection was carried down to the midline fascia which was  sharply incised for 1-cm into the peritoneum under direct vision. Through a  mattress suture of #0 Vicryl the Hasson trocar was placed and  pneumoperitoneum  established. Under direct vision a 10-mm trocar was placed  in the subxiphoid area and two 5-mm trocars in the right subcostal margin.  The liver appeared to be just slightly nodular and enlarged. The gallbladder  was packed with stones and somewhat tense and edematous. The fundus was  grasped, elevated over the liver and the __________ was retracted  inferolaterally. The peritoneum anterior and posterior Calot's triangle was  incised. The fibrofatty tissue was stripped off the neck of the gallbladder  toward the porta hepatis and Calot's triangle thoroughly dissected. The  cystic artery was identified, dissected free, and the cystic duct dissected  at the gallbladder 360 degrees and dissected down over about a centimeter.  When the anatomy was clear, the cystic duct was clipped at the gallbladder  junction and operative cholangiogram obtained through the cystic duct which  showed good filling of normal common bile duct and intrahepatic ducts with  free flow into the duodenum and no filling defects. Following this, the  cholangiocatheter was removed and the cystic duct was triply clipped  proximally and divided. The cystic artery was doubly clipped proximally,  clipped distally, and divided.   The gallbladder was then dissected free from its bed using hook cautery.  It  was placed in an EndoCatch bag, brought to the umbilicus, and then opened  and multiple stones extracted and the gallbladder removed. Hemostasis was  obtained in the gallbladder bed with cautery. With the patient being on  aspirin and Plavix a Surgicel pack was also placed, but there was no  bleeding. Trocars were removed under direct vision. All CO2 was evacuated.  The mattress  suture was carried through the umbilicus. Skin incisions were closed with  interrupted subcuticular 4-0 Monocryl and Steri-Strips.  Sponge, needle, and  instrument counts were correct.  Dry sterile dressings were applied. The  patient was taken  recovery room in good condition.      Misty Blackwell, M.D.  Electronically Signed     BTH/MEDQ  D:  07/13/2005  T:  07/15/2005  Job:  [id number]

## 2010-09-18 NOTE — Discharge Summary (Signed)
Misty Blackwell, Misty Blackwell                ACCOUNT NO.:  [id number]   MEDICAL RECORD NO.:  [id number]          PATIENT TYPE:  INP   LOCATION:  3005                         FACILITY:  [facility]   PHYSICIAN:  Pramod P. Pearlean Brownie, MD    DATE OF BIRTH:  April 09, 1928   DATE OF ADMISSION:  05/12/2004  DATE OF DISCHARGE:  05/13/2004                                 DISCHARGE SUMMARY   DISCHARGE DIAGNOSES:  1.  Transient ischemic attack.  2.  Hypertension.  3.  Dyslipidemia.  4.  Migraines.  5.  Anxiety.  6.  Hiatal hernia.  7.  Arthritis.   DISCHARGE MEDICATIONS:  1.  Aspirin 325 mg a day.  2.  Quinapril 40 mg a day.  3.  Zocor 20 mg a day.  4.  Hydrochlorothiazide 25 mg a day.  5.  Zoloft 50 mg a day.  6.  Xanax 0.5 mg a day.  7.  Atenolol 100 mg.   STUDIES PERFORMED:  1.  CT of the head on admission was negative.  2.  MRI of the brain showed no acute infarct.  3.  MRA of the brain showed no major stenosis.  4.  MRA of the neck showed no major stenosis.  5.  Carotid Doppler was normal.  6.  A 2-D echocardiogram  showed ejection fraction of 55 to 65% with      inability to evaluate LV wall motion but no embolic source but cannot      exclude PFO.  7.  EKG shows sinus bradycardia with nonspecific ST abnormality.   LABORATORY DATA:  Homocystine 9.42.  Hemoglobin A1C 6.1.  Lipid profile has  been drawn with results pending at the time of discharge.  Urine drug screen  positive for benzodiazepines.  Hemoglobin 15.1, hematocrit 44.6, platelets  254, wbc 8.0.  Calcium 9.5. AST 29, ALT 31, alkaline phosphatase 120, total  bilirubin 0.9, albumin 3.9, total protein 7.3.  Chemistry was normal.  Coagulation studies were normal.   HISTORY OF PRESENT ILLNESS:  Ms. Misty Blackwell is a 77 year old right-handed  white female who had sudden onset left upper extremity and face tingling and  numbness while playing the harp.  The morning of admission.  The symptoms  only lasted a few minutes but had slurred speech  during it.  She felt  nauseated and slightly weak, needing help to walk to the car.  She was  brought to the emergency room.  At the emergency room she was back to her  baseline.  She had to be admitted to the hospital for further stroke work-  up.  She was not a candidate for TPA or St. Jude's secondary to quick  resolution of symptoms.   HOSPITAL COURSE:  MRI was unrevealing of a new or acute abnormality or  stroke.  She probably had a TIA though she does have a strong history of  anxiety which may play into this.  The patient on baby aspirin prior to  admission.  Will increase to regular aspirin for secondary stroke prevention  and continue her regular home medications.  Need to  follow up current lipid  profile in case statin needs adjusting.  Otherwise there is no missed  factors identified new during this admission and the patient will be  discharged home in care of husband neurologically normal.   DISCHARGE PLAN:  1.  Discharge home.  2.  Aspirin for secondary stroke prevention.  3.  Follow-up with Dr. Pearlean Brownie in three months.  4.  Call if you have any further symptoms.       SB/MEDQ  D:  05/13/2004  T:  05/13/2004  Job:  [id number]   cc:   Pramod P. Pearlean Brownie, MD  Fax: [phone]   Lucky Cowboy, M.D.  [address], [address]  [place], [place] [address]  Fax: [phone]

## 2010-09-18 NOTE — H&P (Signed)
Misty Blackwell, Misty Blackwell                ACCOUNT NO.:  [id number]   MEDICAL RECORD NO.:  [id number]          PATIENT TYPE:  EMS   LOCATION:  MAJO                         FACILITY:  [facility]   PHYSICIAN:  Thereasa Solo. Little, M.D. DATE OF BIRTH:  [birth year]   DATE OF ADMISSION:  03/18/2006  DATE OF DISCHARGE:                                HISTORY & PHYSICAL   CHIEF COMPLAINT:  Near syncope.   HISTORY OF PRESENT ILLNESS:  A 77 year old white married female who actually  called our office this morning stating that she was having episodes of  starting to pass out and also high heart rate. She dragged herself to the  office. As walking in she almost passed out. I put her in a chair, brought  her up to the office. She was found to be in atrial fibrillation with heart  rate in the 130s and then she would have longer pauses. With these episodes  she had near syncope. Unfortunately were unable to record those at the time.  EMS was called, and she was transferred to [facility] for further  evaluation and from the pacemaker.   Ms. Misty Blackwell has a history of normal coronary arteries. Cath was in April 2007  that was without significant coronary disease. She does have paroxysmal  atrial fibrillation. She underwent cardioversion in June 2007, and then she  came back into atrial fibrillation. Then in September 2007 she was again  cardioverted with 150 watts into sinus rhythm and had been maintaining sinus  rhythm.   She was seen by Dr. Clarene Blackwell on the 18th of October and was maintaining sinus  rhythm at that time.   She states today she had not felt well for two or three days, but the near  syncope only occurred today.   In the past she had about one year ago some bradycardia with pauses during a  gallbladder surgery, but has not had problems until today.   ALLERGIES:  No known allergies.   OUTPATIENT MEDICATIONS:  1. Hydrochlorothiazide 25 daily.  2. Zoloft 50 daily.  3. Benicar had been stopped.  She was on 40 daily, but that seems to have      been stopped.  4. Aspirin 81 daily.  5. Betapace 40 mg twice a day.  6. Norvasc 10 mg a day.  7. Zocor 80, 1/2 tab at bedtime.  8. Coumadin 6 mg a day except 3.5 on Mondays and Thursdays.  9. Niaspan 500 daily.  10.Fish oil daily.   FAMILY HISTORY:  Not significant for this admission. Her father died at 68  of heart trouble. Mother died at 53 of heart trouble, but she had had an MI  in her 69's. One brother with cirrhosis. A sister does have pacemaker.   SOCIAL HISTORY:  Married, lives with her husband. No exercise. No tobacco  products.   REVIEW OF SYSTEMS:  Not felt well for the last several days and near syncope  today. No diarrhea, constipation or melena. GU:  No hematuria or dysuria.  All other systems are negative.   PHYSICAL EXAMINATION:  VITAL SIGNS:  Here in the ER blood pressure 158/100,  pulse 103, respiratory rate 20, temp 97.6, oxygen saturation 2 L at 99%.  GENERAL:  Alert, oriented white female in no acute distress. Face is  flushed.  SKIN:  Warm and dry, otherwise.  NECK:  Supple, no JVD. No bruits detected. Neck without any adenopathy.  LUNGS:  Clear.  HEART:  Rapid, irregular.  ABDOMEN:  Soft, nontender, positive bowel sounds.  EXTREMITIES:  Lower extremities:  No edema, 2+ pedals.  NEUROLOGICAL:  Alert and oriented x3. Affect is pleasant.   LABORATORY DATA:  Pending.   IMPRESSION:  1. Atrial fibrillation with rapid ventricular response.  2. Near syncope with a systole of up to 4 seconds.  3. 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  4. Normal course per catheterization in April 2007.   PLAN:  Admit to step-down unit. She has external pacer in place on standby.  Currently her heart rate is down to 100. Her blood pressure continues to be  elevated, and will add medication for that. She will be monitored over the  weekend and plan for permanent pacemaker Monday or Tuesday of next week once  her INR is less than  126. Will hold her Coumadin. Put her on heparin once  her INR is less than 2. Check further labs. Dr. Clarene Blackwell had seen her. Please  see his H&P note as well.      [name]. Ingold, N.P.    ______________________________  [name], M.D.    LRI/MEDQ  D:  03/18/2006  T:  03/18/2006  Job:  [id number]   cc:   Lynford Humphrey, M.D.

## 2010-09-18 NOTE — Discharge Summary (Signed)
[name], Misty Blackwell                ACCOUNT NO.:  [id number]   MEDICAL RECORD NO.:  [id number]          PATIENT TYPE:  INP   LOCATION:  3023                         FACILITY:  [facility]   PHYSICIAN:  Pramod P. Pearlean Brownie, MD    DATE OF BIRTH:  [birth year]   DATE OF ADMISSION:  01/30/2005  DATE OF DISCHARGE:                                 DISCHARGE SUMMARY   ADMISSION DIAGNOSIS:  Stroke.   DISCHARGE DIAGNOSES:  1.  Left hemispheric transient ischemic attack.  2.  Previous transient ischemic attack, January 2006.  3.  Hypertension.  4.  Hyperlipidemia.  5.  Anxiety and depression.   HOSPITAL COURSE:  Ms. Misty Blackwell is a 77 year old Caucasian lady who developed  sudden onset of slurred speech, right facial weakness and dysphagia at 10:45  a.m. while in a store on [employer]. Her sister was shopping with her,  noted this called EMS and she was brought to the emergency room and code  stroke was called.  I saw the patient immediately upon arrival.  She had  obtained near total improvement in her symptoms.  She had some subjective  right facial numbness and some word hesitancy but she was clearly improving  and hence she was not a candidate for IV thrombolysis intervention due to  rapid resolution of her symptoms.  Obtained CT scan of the head, was  unremarkable.  She was admitted to the stroke service and kept on telemetry  monitoring which did not reveal cardiac arrhythmias.  MRI scan of the brain  was obtained, subsequently, which also did not show an active stroke.  Mild  age-related white matter changes were noted.  MRA of the brain and neck both  did not reveal significant vascular stenosis. A 2-D echocardiogram was done  on the day of admission and results are pending at time of discharge.  The  patient was started on Aggrenox 1 capsule daily for stroke prevention.  This  caused mild headache which was treated with Tylenol and it was responsive to  Tylenol.  Urine drug screen was  negative.  The patient had previously been  on aspirin and Plavix.  She was asked to stop the aspirin and replace it  with Aggrenox.  She felt anxious on the day of discharge and reported mild  depression.  She had been on Zoloft and Xanax which were continued during  the hospital stay.  She complained of nocturnal urinary incontinence and  increased frequency at night.  She was started on Detrol 1 mg tablet at that  time which was continued at time of discharge.  She was advised to follow up  with her primary physician, Jeoffrey Massed, MD in two weeks as needed and  with Dr. Pearlean Brownie in his office in two months.  At the time of discharge she  was _________ condition.  Status was nonfocal.  She had had fluctuation in  her symptoms.  Upon arrival she had been started on IV heparin which was  continued during hospitalization and discontinued on the morning of  discharge.   DISCHARGE MEDICATIONS:  1.  Aggrenox 1 capsule daily for first two weeks, to be increased to twice a      day.  2.  To use Tylenol as needed for headaches.  3.  Hydrochlorothiazide 25 mg daily.  4.  Bentyl 40 mg half a tablet daily in the morning.  5.  Zoloft 100 mg half a tablet daily in the morning.  6.  Xanax 0.5 mg half a tablet daily in the morning.  7.  Zocor 80 mg 1/4 tablet daily at bedtime.  8.  Atenolol 100 mg at bedtime.  9.  Plavix 75 mg daily at bedtime.  10. Vitamin C daily.  11. Glucosamine daily.  12. Centrum Silver once a day.  13. Detrol 1 mg at bedtime.           ______________________________  Misty Blackwell. Pearlean Brownie, MD     PPS/MEDQ  D:  02/01/2005  T:  02/01/2005  Job:  [id number]   cc:   Jeoffrey Massed, MD  Fax: [phone]

## 2010-09-18 NOTE — Discharge Summary (Signed)
NAMEHONI, NAME                ACCOUNT NO.:  [id number]   MEDICAL RECORD NO.:  [id number]          PATIENT TYPE:  INP   LOCATION:  2003                         FACILITY:  [facility]   PHYSICIAN:  Thereasa Solo. Little, M.D. DATE OF BIRTH:  [birth year]   DATE OF ADMISSION:  03/18/2006  DATE OF DISCHARGE:  03/23/2006                               DISCHARGE SUMMARY   HOSPITAL COURSE:  Mrs. Ollinger is a 77 year old, white, married female  who went to the office because she was having presyncope symptoms.  It  was found her heart rate was in the 130s, in atrial fibrillation and she  would have long pauses of about 4 seconds.  She apparently had a  previous history of atrial fibrillation with pauses.  However, over the  last several months she had been doing fairly well.  It was decided to  admit her for recommendation of a pacemaker.  Her medications were put  on hold.  Her INR was 2.8 on admission.  Her sotalol was held.  Over the  next few days, her INR came down.  She did have to be given 1 mg of p.o.  vitamin K on March 20, 2006.  The following day her INR was 1.3.  On  March 22, 2006, she underwent PT/VDP by Dr. Lenise Herald with a  Medtronic EnRhythm [phone] with two Medtronic leads.  She did well.  The  following day she was seen by Dr. Jacinto Halim.  Her blood pressure was 115/70.  Her heart rate was 62.  Her respirations were 20.  O2 saturations 96%.  Her left pacer site was without hematoma or swelling.  Dr. Jenne Campus did  not want her to receive any heparin, Coumadin or Lovenox for 48 hours.  It was decided to send her home and she could start her Coumadin as an  outpatient.  Her sotalol was started back.  Her pacemaker was  interrogated.  It was functioning well.  Her chest x-ray showed no  pneumothorax.   LABORATORY DATA:  Admission hemoglobin 14.8, hematocrit 43.2.  On  March 22, 2006, hemoglobin 13.4, hematocrit 39, platelets 268, WBCs  8.7. INR on admission was 2.8.  On  November 20, it was 1.1.  Sodium was  142, potassium 3.9, BUN was 17, creatinine 0.8, glucose was 99, AST was  29, ALT was 21, TSH was 0.939.  CK-MB and troponin were negative.  There  is no chest x-ray reported in the chart at the time of this dictation;  however, her chest x-ray did show no pneumothorax status post PT/VDP.   DISCHARGE MEDICATIONS:  1. Hydrochlorothiazide 25 mg one tab per day.  She is to only use that      if swelling occurs p.r.n.  2. Citraline 50 mg one tab per day.  3. Aspirin 81 mg one tab per day.  4. Amlodipine 5 mg one tab per day.  5. Simvastatin 40 mg at bedtime.  6. Niacin 500 mg at bedtime.  7. Sodalol 80 mg two times per day.  8. Warfarin.  She should start her regular dose on Friday.  She should      check her level on Tuesday.  9. Benicar 20 mg one time per day.   DISCHARGE INSTRUCTIONS:  She will follow with Dr. Clarene Duke on December 5  at 9 a.m. for a wound check and she should do no reaching, stretching,  pushing, pulling or lifting with her left arm.  No driving x2 weeks.  No  washing the pacer site for seven days, then wash with soap and water.  She will need to see Dr. Jenne Campus in approximately two months for pacer  interrogation.   DISCHARGE DIAGNOSES:  1. PAF/tachybrady syndrome, sick sinus syndrome.  2. Status post PD/VDP secondary to #1.  3. History of normal coronaries, catheterization April of 2007.  4. Anticoagulation to be started on Friday.  5. Hyperlipidemia.  6. Hypertension.      Lezlie Octave, N.P.    ______________________________  Thereasa Solo. Little, M.D.    BB/MEDQ  D:  04/27/2006  T:  04/28/2006  Job:  [id number]   cc:   Jeoffrey Massed, MD

## 2010-09-18 NOTE — H&P (Signed)
NAMESHAMECCA, WHITEBREAD                ACCOUNT NO.:  [id number]   MEDICAL RECORD NO.:  [id number]          PATIENT TYPE:  INP   LOCATION:  1429                         FACILITY:  [facility]   PHYSICIAN:  Thereasa Solo. Little, M.D. DATE OF BIRTH:  1927/06/20   DATE OF ADMISSION:  07/12/2005  DATE OF DISCHARGE:  07/16/2005                                HISTORY & PHYSICAL   CHIEF COMPLAINT:  Rapid atrial fibrillation.   HISTORY OF PRESENT ILLNESS:  A 77 year old white female, status post  cholecystectomy, laparoscopic, on July 13, 2005 with bradycardia post  procedure, and her Lanoxin was DC'd and rhythm stabilized.  Today she  presented to Dr. Fredirick Maudlin office for post hospital followup.  She was found  to be in atrial fibrillation with a heart rate of 130.  She may have been in  this rhythm for approximately three days.  She is short of breath and feels  a fluttering in her chest and does have some chest pressure.  We are  admitting her to [facility] for IV heparin, IV Cardizem, and cardiac cath  in the morning on August 06, 2005, and then we will plan for Coumadin/heparin  crossover.   PAST MEDICAL HISTORY:  She had a cardiac catheterization in 1993 and that  was normal.  EF was normal.  Her last Cardiolite study was November, 2003,  Persantine Cardiolite.  EF was 76%.  No ischemia with study.  She has a  recent history of Holter monitor and has had periods of sinus tachycardia.  In 24 hours, 28 PVCs, 513 PACs, two runs of atrial fib.  The longest beats  were at 129 beats per minute.  Other history includes a history of three  TIAs, one last year and two early this year.  She has recovered from her  cholecystectomy and is doing well from that standpoint.   REVIEW OF SYSTEMS:  GENERAL:  No weight changes specifically.  Skin warm and  dry without rashes.  Eyes:  She wears glasses.  ENT:  Negative.  Respirations:  Some shortness of breath.  CARDIOVASCULAR:  Chest pressure  and fluttering of her  heart.  GI:  No diarrhea, constipation, or melena.  GU:  No hematuria.  MUSCULOSKELETAL:  Negative for pain.  History of TIA.  She does have occasional dizziness since that time.  ENDOCRINE:  No  diabetes.  She does have hyperlipidemia.   CURRENT MEDICATIONS:  1.  Zoloft 100 mg 1/4 tab daily.  2.  Benicar 40 mg 1/2 tab daily.  3.  Aggrenox 200 mg b.i.d., which we are stopping for hospitalization.  4.  HCTZ 25 mg 1/2 daily.  5.  Aspirin 81 mg daily.  6.  Zocor 80 mg 1/4 daily.  7.  Atenolol 100 mg at bedtime.  8.  Xanax 0.5 mg p.r.n.  9.  Vitamin E 1000 IU daily.  10. Vitamin C 1000 mg daily.  11. Glucosamine chondroitin daily.   ALLERGIES:  No known allergies.   FAMILY HISTORY:  Mother had gallstones and died of congestive heart failure.  Her father had cancer of  unknown type and heart failure.  One sister died  with cirrhosis.   SOCIAL HISTORY:  Married.  Does not use tobacco or alcohol.  Does not  exercise.  She has no children.   PHYSICAL EXAMINATION:  VITAL SIGNS:  Today, blood pressure 124/76.  Weight  140-1/2.  Height 5 feet 4-1/2 inches.  Heart rate 118.  GENERAL:  An alert and oriented white female in no acute distress.  SKIN:  Warm and dry.  Brisk capillary refill.  HEENT:  Sclerae are clear.  NECK:  Supple.  No JVD.  No bruits.  LUNGS:  Clear without rales, wheezes, or rhonchi.  HEART:  Heart sounds are S1 and S2, irregularly irregular and tachycardic.  ABDOMEN:  Soft and nontender with positive bowel sounds.  She has four  incisions that are healed over.  EXTREMITIES:  Without edema.  Pedal pulses are present.  NEURO:  Alert and oriented x3.  Moves all extremities.   EKG today is actually atrial fib with a heart rate of 131, probable LVH with  ST/T wave abnormalities.  ST changes are present and new.   ASSESSMENT:  1.  Atrial fibrillation with rapid ventricular response, unknown length of      time.  2.  Recent bradycardia in the hospital with digoxin and  Toprol.  3.  Negative Cardiolite study in 2003.  Negative cardiac catheterization in      1993.   PLAN:  With her chest pressure and EKG changes, we are admitting her to  [facility]  Put her on IV heparin, IV Cardizem.  Put her on TCU instead of  telemetry bed to monitor closely, with her history of bradycardia.  Then we  will do a cardiac catheterization in the morning.  Dr. Clarene Duke talked at  length to the patient and her family.  I will follow her in the hospital.      [name]. Ingold, N.P.    ______________________________  [name], M.D.    LRI/MEDQ  D:  08/05/2005  T:  08/05/2005  Job:  [id number]

## 2010-09-18 NOTE — H&P (Signed)
NAMELUTHER, NEWHOUSE                ACCOUNT NO.:  [id number]   MEDICAL RECORD NO.:  [id number]          PATIENT TYPE:  INP   LOCATION:  0108                         FACILITY:  [facility]   PHYSICIAN:  Sharlet Salina T. Hoxworth, M.D.DATE OF BIRTH:  10/03/1927   DATE OF ADMISSION:  07/12/2005  DATE OF DISCHARGE:                                HISTORY & PHYSICAL   CHIEF COMPLAINT:  Right upper quadrant abdominal pain.   HISTORY OF PRESENT ILLNESS:  Misty Blackwell is a 77 year old white female who  approximately 1 week ago had the fairly rapid onset of crampy right upper  quadrant and epigastric abdominal pain.  She initially thought she had the  flu and did not seek medical attention.  However, over the past week she  has had persistent pain.  It waxes and wanes but has been getting more  severe at times.  It seems to be worse with any motion.  She describes  crampy or pressure-like pain mostly in the right upper quadrant but  radiating over to the epigastrium as well. She has had some low grade nausea  but no vomiting.  Bowel movements have been normal.  She has felt feverish  and chilled on occasion but has not taken her temperature.  She does not  have any history of similar abdominal or GI complaints.  She had not noted  any jaundice.  No urinary symptoms.  She presented to Dr. Kathryne Sharper office  today and was referred to the [facility] emergency room for further workup.   PAST MEDICAL HISTORY:   SURGERY:  Hysterectomy.   MEDICAL:  1.  Hypertension.  2.  History of TIAs  3.  Heart problems, followed by Dr. Clarene Duke, with irregular heartbeat,      apparent atrial fibrillation.  She reports a negative cardiac      catheterization several years ago.  4.  Depression.  5.  Elevated cholesterol.   CURRENT MEDICATIONS:  1.  Zoloft 25 mg a day.  2.  Benicar 20 mg daily.  3.  Hydrochlorothiazide/triamterene 25/37.5 mg daily.  4.  Zocor 20 mg daily.  5.  Atenolol 100 mg daily.  6.  Xanax 0.25  mg daily.  7.  Digitek 0.125 mg b.i.d.  8.  Aggrenox 12 mg b.i.d.   ALLERGIES:  None.   SOCIAL HISTORY:  She is married, does not smoke cigarettes or drink alcohol.   FAMILY HISTORY:  Her mother had gallstones and died of congestive heart  failure.  Her father had cancer of unknown type and heart failure.  She had  a sister who died of cirrhosis of unknown etiology.   REVIEW OF SYSTEMS:  GENERAL:  Questionable fever, chills subjectively in the  past week, weight stable.  HEENT: Denies vision, hearing, swallowing  problems.  RESPIRATORY:  She gets occasional shortness of breath with  exertion. No cough, wheezing.  CARDIAC:  Occasional palpitations.  Denies  chest pain, angina, heart attack, swelling.  Breast mammogram up-to-date, no  masses.  ABDOMEN:  GI as above.  GU: No urinary burning, frequency.  MUSCULOSKELETAL:  Positive for occasional  joint pain.  NEURO:  Positive for  history of TIAs.  No recent symptoms.  HEMATOLOGIC:  No abnormal bleeding,  blood clots.   PHYSICAL EXAMINATION:  VITAL SIGNS:  Temperature is 98.6, pulse 72,  respirations 16, blood pressure 116/63.  GENERAL:  Alert, well-developed female in no acute distress.  SKIN:  Warm, dry.  No rash or infection.  HEENT:  No palpable mass or thyromegaly.  Sclerae nonicteric.  Nares and  oropharynx clear.  LYMPH NODES:  No cervical, subclavicular, axillary, or inguinal nodes  palpable.  LUNGS:  Clear without wheezing or increased work of breathing.  BREASTS:  No masses, tenderness, discharge.  CARDIAC:  Irregular rhythm.  No murmurs.  No JVD.  Peripheral pulses intact.  ABDOMEN:  There is significant right upper quadrant and epigastric  tenderness with guarding.  No palpable masses or hepatosplenomegaly.  Well-  healed low midline incision.  Lower abdomen is soft, nontender.  EXTREMITIES:  No joint swelling.  NEUROLOGIC:  Alert, oriented.  Motor and sensory exams grossly normal.   LABORATORY:  White count is elevated  at 15.2 thousand, hemoglobin 13.9.  Electrolytes normal, except for potassium of 3.3.  Lipase is 21.  LFTs  normal.  UA 3-6 white cells.   Ultrasound of the abdomen was obtained in the emergency room which shows  multiple gallstones, common bile duct at the upper limits of normal.  No  obvious gallbladder wall-thickening or pericholecystic fluid.   ASSESSMENT:  Persistent abdominal pain of one week's duration, consistent  with biliary tract disease.  I suspect ongoing at least low grade  cholecystitis with elevated white count and significant tenderness.  Her  pain has recurred after treatment in the emergency room.   PLAN:  1.  The patient will be admitted for further symptom control and we will      start IV antibiotics.  2.  She will require a cholecystectomy.  3.  We will contact Dr. Clarene Duke regarding her heart history preoperatively.      Lorne Skeens. Hoxworth, M.D.  Electronically Signed     BTH/MEDQ  D:  07/12/2005  T:  07/13/2005  Job:  [id number]   cc:   Lucky Cowboy, M.D.  Fax: [phone]   Thereasa Solo. Little, M.D.  Fax: [phone]

## 2010-09-18 NOTE — Cardiovascular Report (Signed)
Misty Blackwell, Misty Blackwell                ACCOUNT NO.:  [id number]   MEDICAL RECORD NO.:  [id number]          PATIENT TYPE:  OIB   LOCATION:  2899                         FACILITY:  [facility]   PHYSICIAN:  Thereasa Solo. Little, M.D. DATE OF BIRTH:  Jan 19, 1928   DATE OF PROCEDURE:  01/19/2006  DATE OF DISCHARGE:  01/19/2006                              CARDIAC CATHETERIZATION   INDICATIONS FOR PROCEDURE:  This 77 year old female has atrial fibrillation  intermittently. She underwent elective cardioversion on October 27, 2005 but  did not maintain sinus rhythm but for 24 hours and then went back into  atrial fibrillation. Her beta blockers were changed to sotalol, and after a  month's plus of sotalol, she was brought back in for elective cardioversion.   Her potassium was 3.6, her INR was 3.3 and has been well anticoagulated for  weeks. Her left atrial dimension by echocardiogram was 3.7 cm.   After obtaining informed consent, she was given 175 mg of IV Pentothal by  anesthesia. After an appropriate level of anesthesia was obtained, the  patient underwent elective cardioversion with anterior/posterior patches.  Initially, 120 watts seconds resulted in a short run of sinus rhythm, but  then she went into atrial flutter. Then, 150 watts seconds was then applied,  and she converted into sinus rhythm with a rate of 64.   She will be followed up in the office tomorrow.           ______________________________  [name], M.D.     ABL/MEDQ  D:  01/19/2006  T:  01/20/2006  Job:  [id number]   cc:   Catheterization lab  Lucky Cowboy, M.D.

## 2010-09-18 NOTE — Cardiovascular Report (Signed)
NAMEJASMAIN, Misty Blackwell                ACCOUNT NO.:  [id number]   MEDICAL RECORD NO.:  [id number]          PATIENT TYPE:  OIB   LOCATION:  2852                         FACILITY:  [facility]   PHYSICIAN:  Thereasa Solo. Little, M.D. DATE OF BIRTH:  1927/11/20   DATE OF PROCEDURE:  10/27/2005  DATE OF DISCHARGE:                              CARDIAC CATHETERIZATION   This 77 year old female who has been in atrial fibrillation approximately 8  weeks.  She has been appropriately anticoagulated, has an INR of 2.4,  potassium of 4.1.  Her left atrial dimension is 3.7-cm.  Ejection fraction  is normal.   After obtaining informed consent, she is brought in for outpatient elective  cardioversion.   After obtaining 125 mg of IV Pentothal by anesthesia, an appropriate level  of anesthesia was obtained.  She was cardioverted with 120 watt seconds  using anterior/posterior paddles.  With this, she converted from atrial  fibrillation with a rate of 70 to sinus rhythm with  a rate of  64.  Her  blood pressure post cardioversion is 122/71.   She will be discharged to home later today and will  be followed in my  office tomorrow.           ______________________________  [name], M.D.     ABL/MEDQ  D:  10/27/2005  T:  10/27/2005  Job:  [id number]   cc:   Lucky Cowboy, M.D.  Fax: [phone]   Cath Lab

## 2010-09-18 NOTE — H&P (Signed)
Misty Blackwell, Misty Blackwell                ACCOUNT NO.:  [id number]   MEDICAL RECORD NO.:  [id number]          PATIENT TYPE:  INP   LOCATION:  1429                         FACILITY:  [facility]   PHYSICIAN:  Thereasa Solo. Little, M.D. DATE OF BIRTH:  15-Feb-1928   DATE OF ADMISSION:  07/12/2005  DATE OF DISCHARGE:  07/16/2005                                HISTORY & PHYSICAL   This 77 year old patient of Dr. Julieanne Manson being admitted to room 2909 on  August 05, 2005.  DICTATION ENDED AT THIS POINT.      [name]. Ingold, N.P.    ______________________________  [name], M.D.    LRI/MEDQ  D:  08/05/2005  T:  08/05/2005  Job:  [id number]

## 2010-09-18 NOTE — Op Note (Signed)
Misty Blackwell, Misty Blackwell                ACCOUNT NO.:  [id number]   MEDICAL RECORD NO.:  [id number]          PATIENT TYPE:  INP   LOCATION:  2003                         FACILITY:  [facility]   PHYSICIAN:  Darlin Priestly, MD  DATE OF BIRTH:  03-Sep-1927   DATE OF PROCEDURE:  03/22/2006  DATE OF DISCHARGE:                               OPERATIVE REPORT   PROCEDURE:  Implant of a Medtronic EnRhythm generator, model number  [id number], serial [id number], with passive atrial and ventricular leads.   ATTENDING SURGEON:  Darlin Priestly, MD.   COMPLICATIONS:  None.   INDICATIONS:  Miss Mayol is a 77 year old female patient of Dr. Caprice Kluver and Dr. Lynford Humphrey with a history of hypertension, paroxysmal atrial  fibrillation, status post D-C cardioversion x2, history or normal  coronaries by cath in April '07, who recently has had significant  bradycardic episodes with up to 3 to 4-second pauses and presyncope  secondary to sick sinus syndrome.  She is now brought for permanent  pacer implant secondary to sick sinus syndrome.   DESCRIPTION OF OPERATION:  After giving informed written consent, the  patient was brought to the cardiac cath lab, where her left chest was  prepped and draped in sterile fashion.  ECG monitoring was established.  Lidocaine 1% was then used to anesthetized the left midsubclavicular  area.  Next, an approximately 3-cm midinfraclavicular incision was  carried out and hemostasis obtained with electrocautery.  Blunt  dissection was used to carry this down to the level of the deltopectoral  fascia.  Next, an approximately 3 x 4 cm pocket was created over the  left deltopectoral fascia, and, again, hemostasis was obtained with  electrocautery.  The left subclavian vein was then easily entered x2  with the passage of 2 guide wires without difficulty.  Over the first  retained guide wire, a #7-French dilator and then sheath were inserted  into the left subclavian vein and the  dilator and guide wire were  removed.  Through this, a 52-cm passive Medtronic lead, model [id number],  serial # [id number], was then easily passed into the right atrium and  the peel-away sheath was removed.  A second 7-French dilator and sheath  were placed over the second retained guide wire, and the guide wire and  dilator were removed.  Through this, a 45-cm passive Medtronic lead,  model [id number], serial # [id number], was then passed into the right atrium  and the peel-away sheath was removed.  A J curve was then placed on the  ventricular lead stylet and the ventricular lead was then allowed to  pass through the tricuspid valve and was positioned in the RV apex  without difficulty.  Thresholds were then determined.  The R waves were  measured at 16.4 V.  Impedance was 891 ohms.  Threshold was 0.3 V at 0.5  msec.  Current was 0.6 mA and 10 V was negative for diaphragmatic  stimulation.  The atrial lead was then allowed to form and the right  atrial appendage and thresholds were also determined.  P wave was  measured at  4.9 mV.  Current was 2.2 mA.  Threshold in the atrium was  0.8 V at 0.5 msec.  Impedance was 463 ohms.  Again, 10 V was negative  for diaphragmatic stimulation.  Silk 2-0 sutures were then used to  anchor each lead to the left pectoralis fascia.  Hemostasis was again  confirmed.  The pocket was then copiously irrigated with 1% kanamycin  solution.  The leads were then connected in a sterile fashion to a  Medtronic EnRhythm [id number] generator, serial # [id number].  Header  screws were tightened and pacing was confirmed.  A single silk suture  was then placed in the apex of the pocket.  The generator and leads were  then delivered into the pocket and the header was secured to the son-in-  law.  The subcutaneous layer was then used using a running 2-0 Vicryl.  The skin was then closed using running 4-0 Vicryl.  Steri-Strips were  then applied.  The patient was transferred to the  recovery room in  stable condition.   CONCLUSIONS:  Successful implant of a Medtronic EnRhythm, [id number]  generator, serial [id number] with passive atrial and ventricular leads.      Darlin Priestly, MD  Electronically Signed     RHM/MEDQ  D:  03/22/2006  T:  03/22/2006  Job:  [id number]   cc:   Thereasa Solo. Little, M.D.  Dr Lynford Humphrey

## 2010-09-18 NOTE — Discharge Summary (Signed)
Misty Blackwell, Misty Blackwell                ACCOUNT NO.:  [id number]   MEDICAL RECORD NO.:  [id number]          PATIENT TYPE:  INP   LOCATION:  3707                         FACILITY:  [facility]   PHYSICIAN:  Raymon Mutton, P.A. DATE OF BIRTH:  Feb 23, 1928   DATE OF ADMISSION:  08/05/2005  DATE OF DISCHARGE:  08/10/2005                                 DISCHARGE SUMMARY   DISCHARGE DIAGNOSES:  1.  New onset sick sinus syndrome with history of palpitations and      tachycardia/bradycardia on Toprol and Digoxin.  2.  New onset atrial fibrillation with ventricular response, controlled now      on medical therapy in the hospital and the rapid ventricular response on      admission of duration unknown.  3.  Recent laparoscopic cholecystectomy.  4.  Systemic hypertension with normal renal arteries.  5.  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  6.  Hyperlipidemia.  7.  Anxiety/depression.   PROCEDURES:  Cardiac catheterization performed on August 06, 2005, by Dr.  Alanda Amass.  Tests revealed just minimal coronary artery disease,  predominantly in right coronary artery with dilation of 20-30% and then with  stenosis of the proximal RCA and 30-40% stenosis in the mid RCA.  There was  a systolic compression with myocardial bridge junction of the mean and  distal third of the left anterior descending artery, but no significant  atherosclerosis.   RECOMMENDATIONS:  Medical therapy.   HISTORY OF PRESENT ILLNESS:  This is a 77 year old, pleasant, Caucasian  female patient with history of sick sinus syndrome, palpitations and  possible mitral valve prolapse.  She had multiple normal coronaries by cath  in September of 1990 and 1993,  negative Cardiolite in November 2003.  She  recently underwent a laparoscopic cholecystectomy by Dr. Johna Sheriff in 2007.  During that admission, the patient went to atrial  fibrillation with rapid  ventricular response, but also had several hours of substernal pressure and  palpitations.  The patient has been on long term Lanoxin and Atenolol and  developed a bradycardic response during that hospitalization.  Enzymes were  negative at that time.  The patient remained in atrial flutter and was  controlled with ventricular response, on very limited medications in the  hospital and was discharged home, but again, developed tachy palpitations  and was seen by Dr. Clarene Duke in the emergency room and referred for cardiac  catheterization to rule out underlying ischemia,as the cause for rapidatrial  fibrillation.   The patient underwent catheterization by Dr. Alanda Amass on August 06, 2005.  For detailed report on catheterization, please refer to his dictated note.  While in the hospital for catheterization, the patient was pain free.  Groin  did not have any significant complications.  The patient was on IV heparin  and Coumadin for atrial fibrillation.  She was waiting until INR became  therapeutic, and goal INR was 2.0 to 3.0.  On August 10, 2005, Dr. Alanda Amass  told the patient the INR was 2.0 and she was  stable for discharge home.   LABORATORY DATA:  INR on the day of discharge was 2.0, hemoglobin 11.9,  hematocrit 35.0, white blood cell count 9.9, platelets 136,000.  Sodium 142,  potassium 3.5, chloride 108, CO2 29, BUN 9, creatinine 0.8, glucose 102.  Magnesium 2.2, TSH 0.739.   While in the hospital, the patient had a mild hematuria, but it is all  cleared up.  Her urine on the day of discharge was clear.   DISCHARGE INSTRUCTIONS:  1.  She is not to drive or lift weights greater than 5 pounds for two days      after hospitalization.  2.  She is to stay on a low-salt, low-cholesterol diet and increase activity      slowly.   DISCHARGE MEDICATIONS:  1.  Zoloft 25 mg daily.  2.  Zocor 20 mg daily.  3.  Benicar 20 mg daily.  4.  Atenolol 100 mg daily.  5.   Aspirin 81 mg daily.  6.  Coumadin 6 mg as directed.  7.  Xanax 0.5 mg as needed.  8.  The patient was instructed to stop Aggrenox and she needs to have blood      work done to recheck her pro time in a week on Monday, August 16, 2005.   FOLLOWUP:  She will be seen in follow up by Dr. Clarene Duke in our office andand  I will call the patient to informabout scheduling an appointment.      Raymon Mutton, P.A.     MK/MEDQ  D:  08/10/2005  T:  08/10/2005  Job:  [id number]   cc:   Thereasa Solo. Little, M.D.  Fax: [phone]   Lucky Cowboy, M.D.  Fax: [phone]   Pramod P. Pearlean Brownie, MD  Fax: [phone]   Lorne Skeens. Hoxworth, M.D.  [address]  [place]  [place] [address]

## 2010-09-18 NOTE — Discharge Summary (Signed)
NAMEKRISTYANNA, Misty Blackwell                ACCOUNT NO.:  [id number]   MEDICAL RECORD NO.:  [id number]          PATIENT TYPE:  INP   LOCATION:  1429                         FACILITY:  [facility]   PHYSICIAN:  Sharlet Salina T. Hoxworth, M.D.DATE OF BIRTH:  Sep 20, 1927   DATE OF ADMISSION:  07/12/2005  DATE OF DISCHARGE:  07/16/2005                                 DISCHARGE SUMMARY   DISCHARGE DIAGNOSES:  1.  Cholelithiasis and cholecystitis.  2.  Bradycardia.   OPERATIONS AND PROCEDURES:  Laparoscopic cholecystectomy with intraoperative  cholangiogram, Dr. Johna Sheriff, July 13, 2005.   CONSULTATIONS:  Cardiology, Dr. Clarene Duke.   HISTORY OF PRESENT ILLNESS:  Misty Blackwell is a 77 year old female who one  week prior to admission had the onset of crampy right upper quadrant and  epigastric abdominal pain.  She initially did not seek medical attention.  Over the past week she has however had persistent pain waxing and waning  getting more severe at times.  She describes pressure crampy-like pain in  the right upper quadrant/epigastrium with some nausea but no vomiting.  Bowel movements have been normal.  She has felt feverish but has not taken  her temperature.  She has no history of any similar abdominal GI complaints.  No jaundice.  No urinary symptoms.  She presented to Dr. Kathryne Sharper office  today and was referred to [facility] Emergency Room for further workup.   PAST MEDICAL HISTORY:  Surgically, it is hysterectomy.  Medically, she is  followed for hypertension, history of TIAs, heart problems followed by Dr.  Clarene Duke with irregular heartbeat.  She reports a negative cardiac  catheterization several years ago.  Depression.  Elevated cholesterol.   MEDICATIONS ON ADMISSION:  1.  Zoloft 25 mg daily.  2.  Benicar 20 mg daily.  3.  Hydrochlorothiazide.  4.  Triamterene 25/37.5 daily.  5.  Zocor 20 daily.  6.  Atenolol 100 mg daily.  7.  Xanax 0.25 mg daily.  8.  Digitek 0.125 mg b.i.d.  9.  Aggrenox  12 mg b.i.d.   ALLERGIES:  NONE.   SOCIAL HISTORY/FAMILY HISTORY/REVIEW OF SYSTEMS:  Noncontributory.  See  admission H&P.   PHYSICAL EXAM:  VITAL SIGNS:  She is afebrile.  Vital signs all within  normal limits.  GENERAL:  Alert, well-developed female in no acute distress.  CARDIAC:  Cardiac exam showed an irregular rhythm, no murmurs, no JVD,  peripheral pulses intact.  ABDOMEN:  Abdomen showed significant right upper quadrant and epigastric  tenderness with guarding.   LABORATORY:  White count was elevated at 15,000, hemoglobin 13.9.  Electrolytes abnormal for potassium of 3.3.  LFTs normal.   Ultrasound of the abdomen obtained in the emergency room showed multiple  gallstones, common bile duct upper limits of normal, no obvious gallbladder  wall thickening or pericholecystic fluid.   HOSPITAL COURSE:  The patient was admitted with an impression of  persistently symptomatic gallstones and probably early cholecystitis.  She  was started on IV antibiotics.  Laparoscopic cholecystectomy was recommended  and accepted and she was taken to the operating room on July 13, 2005  and  underwent uneventful lap chole with IOC with evidence of early  cholecystitis.  Of note is she did have a heart rate as low as the 30s  intraoperatively.  Digoxin level returned at 2.3.  She was monitored  postoperatively, digoxin held and cardiology consult obtained from Dr.  Clarene Duke.  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  Digoxin was discontinued.  On July 15, 2005,  she was  noted to have an episode of decreased level of consciousness with low O2  saturations and temperature increased to 102.4.  She was given Narcan with  improvement.  Oversedation was suspected.  This episode resolved quickly and  follow-up chest x-ray showed no significant abnormalities.  CT scan of the  chest showed some small bilateral probably benign pulmonary nodules.  This  was done due  to a questionable nodule on her chest x-ray.  Follow-up CT in 3  months was recommended.  By July 16, 2005, she was feeling well.  Oxygen  saturations were 92% on room air.  Abdomen was soft and nontender.  Pathology revealed chronic cholecystitis and cholelithiasis.  Discharge  medications are the same as admission except digoxin has been held.  She has  plans for followup with cardiology as an outpatient.  It is felt she might  need a pacemaker at some point.  Followup is to be in my office 2 weeks.      Lorne Skeens. Hoxworth, M.D.  Electronically Signed     BTH/MEDQ  D:  08/17/2005  T:  08/17/2005  Job:  [id number]   cc:   Thereasa Solo. Little, M.D.  Fax: [phone]   Lucky Cowboy, M.D.  Fax: [phone]

## 2010-09-18 NOTE — Cardiovascular Report (Signed)
NAMECONCHA, SUDOL                ACCOUNT NO.:  [id number]   MEDICAL RECORD NO.:  [id number]          PATIENT TYPE:  INP   LOCATION:  2909                         FACILITY:  [facility]   PHYSICIAN:  Richard A. Alanda Amass, M.D.DATE OF BIRTH:  1927/11/12   DATE OF PROCEDURE:  08/06/2005  DATE OF DISCHARGE:                              CARDIAC CATHETERIZATION   PROCEDURE:  Retrograde central aortic catheterization, selective coronary  angiography via Judkins technique, pre and post IC nitroglycerin  administration, LV angiogram RAO and LAO projection, abdominal aortic  angiogram hand injection, weight adjusted heparin, IVUS interrogation mid-  LAD area of systolic kinking, compression and myocardial bridging.   BRIEF HISTORY:  Ms. Rommel is a 77 year old white woman who has a history of  sick sinus syndrome with palpitations and possible MVP.  She has had remote  normal coronary arteries on catheterization in September 1993.  She had  negative Cardiolite for ischemia in November 2003.  She recently underwent a  laparoscopic cholecystectomy by Dr. Johna Sheriff on July 13, 2005, which was  uncomplicated for symptomatic cholecystitis.  She was seen by Dr. Clarene Duke in  the office yesterday and was in atrial fibrillation with rapid ventricular  response.  She also had several hours of substernal pressure and  palpitations.  She had been on long-term atenolol and Lanoxin.  Lanoxin was  discontinued because of relative bradycardia post cholecystectomy.  Myocardial infarction was ruled out by serial enzymes and EKGs.  The patient  remained in atrial flutter with controlled ventricular response on limited  medication in the hospital.  Dr. Clarene Duke referred her for diagnostic coronary  angiography in this setting to rule out ischemic component.  Informed  consent was obtained to proceed with the procedure.  The patient had normal  renal function preoperatively.  She was also found to have hypokalemia on  admission with potassium of the 2.8.  This was repleted preoperatively with  IV potassium and her last labs showed a potassium of 4.0 which will be  followed up.  She was in atrial flutter with controlled ventricular  response.   DESCRIPTION OF PROCEDURE:  The right groin is prepped and draped in the  usual manner.  1% Xylocaine was used for local anesthesia and the RFCA was  entered with single anterior puncture using 18 thin-wall needle.  A 6-French  short Daig sidearm sheath inserted without difficulty.  Diagnostic coronary  angiography was done with 6 French 4 cm taper Cordis preformed coronary and  pigtail catheters using Omnipaque dye throughout the procedure.  IC  nitroglycerin was given with repeat left coronary injections obtained.  LV  angiogram was then done in the RAO and LAO projection, 25 mL at 14 mL per  second and 20 mL at 12 mL per second.  Pullback pressure CA was done and  showed no gradient across the aortic valve.  Abdominal aortic angiogram in  the midstream PA projection was performed and showed two accessory renal  arteries on the right that were normal and one normal renal artery on the  left.  No significant infrarenal atherosclerotic disease.  Pressures:  LV:  140/0; LVEDP 14-16 mmHg.  CA:  145/82 mmHg.   LV angiogram in the RAO and LAO projection showed normally contracting left  ventricle with EF approximately 60%, mild angiographic LVH present.  There  was angiographic mitral valve prolapse evident with trace mitral  regurgitation.   Fluoroscopy did not show intracardiac or valvular calcification.  There was  no significant coronary calcification.   The main left coronary was long and normal.   The LAD coursed to the apex of the heart where it bifurcated.  It gave off  three small diagonals from the proximal and midportion that were normal.  The LAD appeared moderate size and normal throughout its course except for a  focal area of systolic kinking and  compression to approximately 60-70% in  the junction of the mid and distal third of the LAD between DX3 and DX4.  There was also decreased dye density in this area.  It was more prominent  after nitroglycerin administration.  The remainder of the vessel still  appeared normal. There is a moderate size optional diagonal that was normal.   There was a nondominant circumflex comprised of marginal a small AV groove  branch that were normal.   The right coronary was a dominant vessel.  There was mild atherosclerotic  changes in the proximal third with 20-30% narrowing with good residual lumen  in this large vessel and irregularities.  There were also irregularities  before the acute margin with an area of eccentric 30-40% narrowing with good  residual lumen.  The remainder of the RCA was widely patent and normal with  a very dominant RCA with a multi-bifurcating PLA and a bifurcating large  PDA.   It was felt best to go ahead and do IVUS interrogation of this mid LAD  lesion to rule out any significant atherosclerotic change in this area of  systolic compression presumably focal myocardial bridging.  The patient was  given weight adjusted heparin.  ACTs were monitored and were therapeutic and  greater than 300 seconds, given 4500 units total of heparin.  The left  coronary was intubated with a 6-French JL4 Scimed guiding catheter and the  lesion was crossed with 0.014 inch Asahi soft wire.  The Atlantis Pro- 44  MHz IVUS was inserted under fluoroscopic control in the distal LAD and IVUS  pull back was done.  The area of systolic compression was well visualized.  Prior to putting the IVUS catheter in, the wire straightened out nicely with  just a guide wire and angiography did not show any significant stenosis.  With IVUS, the diastolic area was 2.8 x 2.5 and during systole was 2.2 x  1.7.  There was approximately 40% narrowing during systole.  There was however, no significant  atherosclerosis in this portion of the LAD or in the  remainder of the LAD visualized on IVUS pull back from the distal third up  to the proximal third.   The IVUS catheter was removed and sidearm sheath was flushed.  A repeat ACT  was 241 seconds.  We then closed the right femoral arteriotomy with a 6-  [place] Starclose nitinol clip device successfully.  The patient will be  given a gram of Ancef IV prophylaxis.  She needs to be started back on  heparin because of her persistent atrial flutter now.  I will also then go  ahead and start her on Coumadin cross over and medical therapy for rate  control.  She is  a candidate for follow-up DC cardioversion and/or TE  cardioversion.  She does have sick sinus syndrome with pauses so rate  medications will have to be adjusted carefully to avoid significant  bradycardia which might prompt consideration for permanent pacing.  A follow-  up 2D echo may be helpful to follow her mitral valve prolapse which is  angiographic.   CATHETERIZATION DIAGNOSIS:  1.  New onset sick sinus syndrome - history of palpitations and tachycardia.  2.  New onset atrial flutter with ventricular response controlled on medical      therapy in the hospital.  3.  Recent laparoscopic cholecystectomy.  4.  Systemic hypertension, normal renal arteries.  5.  Minimal coronary disease predominant right coronary artery.  6.  Systolic compression with myocardial bridge junction of the mid and      distal third of the left anterior descending, no significant      atherosclerosis, recommend medical therapy.      Richard A. Alanda Amass, M.D.  Electronically Signed     RAW/MEDQ  D:  08/06/2005  T:  08/06/2005  Job:  [id number]   cc:   Thereasa Solo. Little, M.D.  Fax: [phone]   Lucky Cowboy, M.D.  Fax: [phone]   Lorne Skeens. Hoxworth, M.D.  [address], [address]  [place]  [place] [address]   Pramod P. Pearlean Brownie, MD  Fax: [phone]

## 2010-09-18 NOTE — H&P (Signed)
Misty Blackwell, Misty Blackwell                ACCOUNT NO.:  [id number]   MEDICAL RECORD NO.:  [id number]          PATIENT TYPE:  INP   LOCATION:  1825                         FACILITY:  [facility]   PHYSICIAN:  Pramod P. Pearlean Brownie, MD    DATE OF BIRTH:  06-07-1927   DATE OF ADMISSION:  05/12/2004  DATE OF DISCHARGE:                                HISTORY & PHYSICAL   REASON FOR REFERRAL:  TIA.   HISTORY OF PRESENT ILLNESS:  Misty Blackwell is a 77 year old pleasant Caucasian  lady who developed sudden onset of paresthesias affecting the left upper  extremity and face while playing the harp in music class earlier this  morning.  These symptoms resolved in a few minutes, but she had some  persistent slurred speech as well as had a mild headache and felt weak and  needed assistance to walk to the car.  Symptoms improved by the time she  reached the emergency room, and now apparently she is back to her baseline.  She has no prior history of known TIA, strokes, significant neurological  problems.  She does have a history of remote migraines, but these are mainly  ocular and have never been accompanied by similar paresthesias.   PAST MEDICAL HISTORY:  1.  Hypertension.  2.  Hyperlipidemia.  3.  Migraines.   HOME MEDICATIONS:  1.  Aspirin.  2.  Zocor.  3.  Blood pressure medicine.   MEDICATION ALLERGIES:  None known.   PAST SURGICAL HISTORY:  None recently, though she has had previous surgeries  in the remote past.   SOCIAL HISTORY:  She is married and lives with her husband in [place].  She is retired.  She used to work for [employer].  She does not smoke or  drink.   REVIEW OF SYSTEMS:  Not significant for chest pain, palpitations, cough,  diarrhea, or other illness.   PHYSICAL EXAMINATION:  GENERAL:  Reveals a pleasant elderly Caucasian lady  who is not in distress.  TEMPERATURE:  Afebrile.  PULSE RATE:  72 per minute, regular.  RESPIRATORY RATE:  10 per minute.  HEENT:  Nontraumatic.   ENT exam unremarkable.  NECK:  Supple, without bruit.  CARDIAC:  No murmur, rub, or gallop.  LUNGS:  Clear to auscultation.  ABDOMEN:  Soft, nontender.  NEUROLOGIC:  The patient is pleasant, awake, alert, awake, cooperative.  There is no aphasia, apraxia, or dysarthria.  She has intact retention,  registration and recall.  She follows commands well.  Her movements are full  range, without weakness or nystagmus.  Visual acuity and fields are  accurate.  Face is symmetric.  Bilateral movements are normal.  Tongue is  midline.  Motor system exam reveals no upper extremity drift.  Symmetric  strength, tone, reflexes, coordination, and sensation.  Gait was not tested.  Finger-to-nose and knee-to-heel coordination accurate.   DATA:  Review of noncontrast CT scan of the head done today reveals no acute  abnormality.  Admission labs are pending at this time.   IMPRESSION:  A 77 year old lady with possible posterior circulation TIA.  Vascular risk  factors are hypertension, hyperlipidemia, age, and sex.   PLAN:  The patient will be admitted to the stroke service for risk  stratification evaluation.  We will check MRI scan of the brain with MRA of  the brain and neck, cardiac echo, Doppler studies, and fasting lipid  profile, immunoglobulin, and homocysteine.  Continue aspirin 325 mg daily  for now, and will make a final decision after the above workup is completed.  I had a long discussion with the patient with regard to nature of symptoms,  discussed plan for ___________ treatment and answered questions.       PPS/MEDQ  D:  05/12/2004  T:  05/12/2004  Job:  [id number]   cc:   Jeoffrey Massed, MD  [address]  [place]  [place] [address]  Fax: [phone]

## 2011-02-02 LAB — LIPID PANEL
Cholesterol: 181
HDL: 38 — ABNORMAL LOW
LDL Cholesterol: 127 — ABNORMAL HIGH
Total CHOL/HDL Ratio: 4.8
Triglycerides: 82
VLDL: 16

## 2011-02-02 LAB — COMPREHENSIVE METABOLIC PANEL
ALT: 26
AST: 20
AST: 31
Albumin: 3 — ABNORMAL LOW
Albumin: 3.5
Alkaline Phosphatase: 120 — ABNORMAL HIGH
BUN: 8
CO2: 29
CO2: 34 — ABNORMAL HIGH
Calcium: 8.8
Chloride: 105
Chloride: 97
Creatinine, Ser: 0.7
Creatinine, Ser: 0.72
GFR calc Af Amer: >60
GFR calc Af Amer: >60
GFR calc non Af Amer: >60
GFR calc non Af Amer: >60
Potassium: 3.2 — ABNORMAL LOW
Sodium: 136
Total Bilirubin: 0.9
Total Bilirubin: 1.5 — ABNORMAL HIGH

## 2011-02-02 LAB — CBC
HCT: 37.7
HCT: 40.8
HCT: 42.4
MCHC: 33.7
MCV: 91
MCV: 91.3
MCV: 91.4
Platelets: 203
Platelets: 209
RBC: 4.65
RBC: 4.69
WBC: 11.4 — ABNORMAL HIGH
WBC: 16.8 — ABNORMAL HIGH
WBC: 17.2 — ABNORMAL HIGH

## 2011-02-02 LAB — BASIC METABOLIC PANEL
BUN: 8
Calcium: 8.5
GFR calc non Af Amer: >60
Potassium: 3.4 — ABNORMAL LOW
Sodium: 137

## 2011-02-02 LAB — POCT I-STAT, CHEM 8
BUN: 15
Chloride: 99
Creatinine, Ser: 0.9
Sodium: 138
TCO2: 29

## 2011-02-02 LAB — URINE CULTURE

## 2011-02-02 LAB — URINALYSIS, ROUTINE W REFLEX MICROSCOPIC
Ketones, ur: 15 — AB
Nitrite: POSITIVE — AB
Urobilinogen, UA: 1

## 2011-02-02 LAB — URINE MICROSCOPIC-ADD ON

## 2011-02-02 LAB — PROTIME-INR
INR: 1.5
INR: 1.8 — ABNORMAL HIGH
INR: 2.3 — ABNORMAL HIGH
Prothrombin Time: 26.9 — ABNORMAL HIGH

## 2011-02-02 LAB — CULTURE, BLOOD (ROUTINE X 2)

## 2011-02-02 LAB — EXPECTORATED SPUTUM ASSESSMENT W GRAM STAIN, RFLX TO RESP C

## 2011-02-21 ENCOUNTER — Emergency Department (HOSPITAL_COMMUNITY): Payer: Medicare Other

## 2011-02-21 ENCOUNTER — Observation Stay (HOSPITAL_COMMUNITY)
Admission: EM | Admit: 2011-02-21 | Discharge: 2011-02-22 | DRG: 312 | Disposition: A | Discharge status: Home / Self Care | Payer: Medicare Other | Attending: Internal Medicine | Admitting: Internal Medicine

## 2011-02-21 DIAGNOSIS — I495 Sick sinus syndrome: Secondary | ICD-10-CM | POA: Insufficient documentation

## 2011-02-21 DIAGNOSIS — I2510 Atherosclerotic heart disease of native coronary artery without angina pectoris: Secondary | ICD-10-CM | POA: Insufficient documentation

## 2011-02-21 DIAGNOSIS — Z7982 Long term (current) use of aspirin: Secondary | ICD-10-CM | POA: Insufficient documentation

## 2011-02-21 DIAGNOSIS — Z8673 Personal history of transient ischemic attack (TIA), and cerebral infarction without residual deficits: Secondary | ICD-10-CM | POA: Insufficient documentation

## 2011-02-21 DIAGNOSIS — R9431 Abnormal electrocardiogram [ECG] [EKG]: Secondary | ICD-10-CM | POA: Insufficient documentation

## 2011-02-21 DIAGNOSIS — Z7901 Long term (current) use of anticoagulants: Secondary | ICD-10-CM | POA: Insufficient documentation

## 2011-02-21 DIAGNOSIS — R55 Syncope and collapse: Principal | ICD-10-CM | POA: Insufficient documentation

## 2011-02-21 DIAGNOSIS — R0602 Shortness of breath: Secondary | ICD-10-CM | POA: Insufficient documentation

## 2011-02-21 DIAGNOSIS — Z950 Presence of cardiac pacemaker: Secondary | ICD-10-CM | POA: Insufficient documentation

## 2011-02-21 DIAGNOSIS — I4891 Unspecified atrial fibrillation: Secondary | ICD-10-CM | POA: Insufficient documentation

## 2011-02-21 DIAGNOSIS — I10 Essential (primary) hypertension: Secondary | ICD-10-CM | POA: Insufficient documentation

## 2011-02-21 DIAGNOSIS — R110 Nausea: Secondary | ICD-10-CM | POA: Insufficient documentation

## 2011-02-21 DIAGNOSIS — F411 Generalized anxiety disorder: Secondary | ICD-10-CM | POA: Insufficient documentation

## 2011-02-21 LAB — POCT I-STAT, CHEM 8
Calcium, Ion: 1.22 mmol/L (ref 1.12–1.32)
Chloride: 105 mEq/L (ref 96–112)
HCT: 43 % (ref 36.0–46.0)
Hemoglobin: 14.6 g/dL (ref 12.0–15.0)
TCO2: 24 mmol/L (ref 0–100)

## 2011-02-21 LAB — DIFFERENTIAL
Basophils Absolute: 0.1 10*3/uL (ref 0.0–0.1)
Basophils Relative: 1 % (ref 0–1)
Eosinophils Absolute: 0.2 10*3/uL (ref 0.0–0.7)
Eosinophils Relative: 2 % (ref 0–5)
Monocytes Absolute: 1 10*3/uL (ref 0.1–1.0)
Monocytes Relative: 11 % (ref 3–12)
Neutro Abs: 4.5 10*3/uL (ref 1.7–7.7)

## 2011-02-21 LAB — PROTIME-INR
INR: 2.95 — ABNORMAL HIGH (ref 0.00–1.49)
Prothrombin Time: 31.2 seconds — ABNORMAL HIGH (ref 11.6–15.2)

## 2011-02-21 LAB — COMPREHENSIVE METABOLIC PANEL
ALT: 18 U/L (ref 0–35)
AST: 19 U/L (ref 0–37)
Alkaline Phosphatase: 140 U/L — ABNORMAL HIGH (ref 39–117)
CO2: 29 mEq/L (ref 19–32)
Calcium: 9.3 mg/dL (ref 8.4–10.5)
GFR calc Af Amer: >90 mL/min (ref >90)
GFR calc non Af Amer: 82 mL/min — ABNORMAL LOW (ref >90)
Glucose, Bld: 96 mg/dL (ref 70–99)
Potassium: 3.8 mEq/L (ref 3.5–5.1)
Sodium: 139 mEq/L (ref 135–145)
Total Protein: 6.9 g/dL (ref 6.0–8.3)

## 2011-02-21 LAB — CBC
MCH: 30.6 pg (ref 26.0–34.0)
MCHC: 33.3 g/dL (ref 30.0–36.0)
Platelets: 227 10*3/uL (ref 150–400)
RDW: 13.6 % (ref 11.5–15.5)

## 2011-02-21 LAB — AMYLASE: Amylase: 44 U/L (ref 0–105)

## 2011-02-21 LAB — TSH: TSH: 0.698 u[IU]/mL (ref 0.350–4.500)

## 2011-02-21 LAB — CARDIAC PANEL(CRET KIN+CKTOT+MB+TROPI)
CK, MB: 2.5 ng/mL (ref 0.3–4.0)
Relative Index: INVALID (ref 0.0–2.5)
Troponin I: <0.3 ng/mL (ref <0.30)

## 2011-02-21 LAB — HEMOGLOBIN A1C: Mean Plasma Glucose: 126 mg/dL — ABNORMAL HIGH (ref <117)

## 2011-02-21 LAB — POCT I-STAT TROPONIN I: Troponin i, poc: 0 ng/mL (ref 0.00–0.08)

## 2011-02-22 LAB — BASIC METABOLIC PANEL
BUN: 13 mg/dL (ref 6–23)
CO2: 29 mEq/L (ref 19–32)
Calcium: 9.2 mg/dL (ref 8.4–10.5)
Chloride: 104 mEq/L (ref 96–112)
Creatinine, Ser: 0.67 mg/dL (ref 0.50–1.10)
GFR calc Af Amer: >90 mL/min (ref >90)
GFR calc non Af Amer: 80 mL/min — ABNORMAL LOW (ref >90)
Glucose, Bld: 95 mg/dL (ref 70–99)
Potassium: 3.9 mEq/L (ref 3.5–5.1)
Sodium: 141 mEq/L (ref 135–145)

## 2011-02-22 LAB — URINALYSIS, ROUTINE W REFLEX MICROSCOPIC
Bilirubin Urine: NEGATIVE
Hgb urine dipstick: NEGATIVE
Nitrite: NEGATIVE
Protein, ur: NEGATIVE mg/dL
Specific Gravity, Urine: 1.01 (ref 1.005–1.030)
Urobilinogen, UA: 0.2 mg/dL (ref 0.0–1.0)

## 2011-02-22 LAB — PROTIME-INR
INR: 2.96 — ABNORMAL HIGH (ref 0.00–1.49)
Prothrombin Time: 31.3 seconds — ABNORMAL HIGH (ref 11.6–15.2)

## 2011-02-22 LAB — CBC
HCT: 38.9 % (ref 36.0–46.0)
Hemoglobin: 12.3 g/dL (ref 12.0–15.0)
RBC: 4.19 MIL/uL (ref 3.87–5.11)
WBC: 8.8 10*3/uL (ref 4.0–10.5)

## 2011-02-22 LAB — CARDIAC PANEL(CRET KIN+CKTOT+MB+TROPI)
Relative Index: INVALID (ref 0.0–2.5)
Total CK: 40 U/L (ref 7–177)
Troponin I: <0.3 ng/mL (ref <0.30)

## 2011-02-23 NOTE — H&P (Signed)
Misty Blackwell, Misty Blackwell                ACCOUNT NO.:  [id number]  MEDICAL RECORD NO.:  [id number]  LOCATION:  3708                         FACILITY:  [facility]  PHYSICIAN:  [place] Shalana Jardin, MD         DATE OF BIRTH:  1928-03-22  DATE OF ADMISSION:  02/21/2011 DATE OF DISCHARGE:                             HISTORY & PHYSICAL   CHIEF COMPLAINT:  Nausea and dizziness, near-syncope, chest fluttering.  HISTORY OF PRESENT ILLNESS:  An 77 year old white married female who presents to the emergency room at [facility] today on February 21, 2011, with near syncope.  She became weak and dizzy while fixing her husband's breakfast, short of breath, felt faint, she was also nauseated, she thought she was having a panic attack at first, she was off-balance, but then started feeling better, went to church, but as the morning progressed in church she became more and more weak.  Then she felt extremely nauseated, then trembling, she could not stand, she felt she would just pass out.  She could not get her breath at one point.  She has had some numbness around her mouth as well.  EMS was called.  Here in the emergency room, she received 1 mg IV Ativan and 4 mg IV Zofran. Currently, she still has some mild nausea, no chest pain.  Additionally in the emergency room, on her EKG she has paced rhythm, and then she has deep T-wave inversions in her lateral leads unfortunately and this was with her intrinsic beats, unfortunately I do not have an old EKG of her normal rhythm to determine if this is new or old.  In a paced rhythm, she does not have inverted T-waves.  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  She has been in chronic AFib since March and because of this, her mode is DDIR.  She is paced 81% of the time.  With the pacer off, she was in AFib with a rate of 50.   This interrogation was done on December 16, 2010.  Additionally, she had a heart catheterization in 2007, that showed nonobstructive disease.  The LAD had some systolic kinking with a compression up to 60-70%, and IVUS was then done on the vessel and there was no significant blockage with IVUS (intravascular ultrasound).  Her right coronary artery had 20-30% narrowing and then 30-40% before the takeoff of the acute marginal.  She does not remember her most recent stress test since the cath, and I will need to get records from the office.  PAST MEDICAL HISTORY:  As stated tachy-brady with a permanent Medtronic pacemaker, anticoagulation with Coumadin for chronic AFib currently and she is therapeutic on Coumadin here in the emergency room, history of TIAs, history of hypertension, she has had a cholecystectomy.  OUTPATIENT MEDICATIONS: 1. Aspirin enteric-coated 81 mg daily. 2. Bystolic 5 mg 1-1/2 tablets daily. 3. Diltiazem CD 360 mg daily. 4. Lanoxin 0.125 mg daily. 5. Xanax 1 mg b.i.d. p.r.n. almost always at bedtime. 6. Coumadin 6 mg tablets, she has been taking half  a tablet every day     except Thursdays, she takes 1 and 3/4 tablet which equals 10.5 mg. 7. Pravastatin 40 mg half a tablet daily. 8. Lasix, she only takes 20 mg daily, she takes half of 40 mg tablet     daily. 9. Potassium 20 mEq daily. 10.Fish oil 300 mg daily. 11.Meclizine 12.5 mg p.r.n. 12.Vitamin D 2000 international units 2 daily.  ALLERGIES:  Intolerance to AMIODARONE.  SOCIAL HISTORY:  She is married.  They have no children.  Her husband has dementia and she has a lot of stress with this.  He also has macular degeneration.  He is in his 90s and she is his caregiver.  Please note, she does not use tobacco or alcohol either.  FAMILY HISTORY:  Not significant this admission.  REVIEW OF SYSTEMS:  GENERAL:  No colds or fevers.  GI:  Retching and nausea today, but no diarrhea, constipation, or melena.  GU:   No hematuria, dysuria.  HEENT:  She wears bilateral hearing aids.  She wears glasses.  NEUROLOGIC:  Feeling off balance and like she might pass out today, mild headache.  MUSCULOSKELETAL:  She fell the other day and injured her left foot, it has ecchymosis, right foot has mild edema. ENDOCRINE:  No diabetes or thyroid problems.  PHYSICAL EXAMINATION:  VITAL SIGNS:  Today, blood pressure 152/78 initially, now 118/64, pulse 69, respirations 24, temp 97.5, oxygen saturation 97%. GENERAL:  Alert, oriented, white female, in no acute distress.  After Ativan pleasant affect. SKIN:  Warm and dry.  Brisk capillary refill. HEENT:  Normocephalic.  Sclerae clear. NECK:  Supple.  No bruits. HEART:  S1, S2.  Regular rate and rhythm. LUNGS:  Clear with a few crackles in the left base. ABDOMEN:  Soft, nontender, positive bowel sounds, could not palpate liver, spleen, or masses. EXTREMITIES:  Left foot with ecchymosis, mild edema in the right foot, dry skin. NEUROLOGIC:  Alert and oriented x3.  Follows commands.  LAB VALUES:  Troponin initial 0.00.  Follow up CK was 58 with MB of 2.5, troponin I less than 0.30.  Dig level 0.7.  Protime 31.2 with an INR of 2.95, hemoglobin 13.9, hematocrit 41.8, platelets 227.  WBC 9.5, sodium 142, potassium 4.1, BUN 18, creatinine 0.70, glucose 104.  Chest x-ray, cardiomegaly without acute disease. EKG pacing with intrinsic beats with deep T-wave inversions, possibly due to LVH.  IMPRESSION: 1. Near syncope associated with nausea, shortness of breath. 2. Sick sinus syndrome with permanent pacemaker in the past for tachy-     brady. 3. Chronic atrial fibrillation, rate controlled. 4. Anxiety. 5. History of nonobstructive coronary disease. 6. Anticoagulation, therapeutic INR.  PLAN:  Per Dr. Rennis Golden, EKG changes may be related to her Lanoxin.  We will rule out acute coronary syndrome.  We will maintain her Coumadin with therapeutic INR.  We will check LFTs,  serial CK-MBs, and troponins, this all may be related to anxiety.  We will also check orthostatics blood pressures.  Dr. Rennis Golden saw her and assessed her with me.     Misty Blackwell. Annie Paras, N.P.   ______________________________ [place] Jordain Radin, MD    LRI/MEDQ  D:  02/21/2011  T:  02/22/2011  Job:  [id number]  cc:   Thereasa Solo. Little, M.D. Dr. Yates Decamp  Electronically Signed by Nada Boozer N.P. on 02/22/2011 01:39:11 PM Electronically Signed by Kirtland Bouchard. Kvion Shapley M.D. on 02/23/2011 08:24:46 AM

## 2011-02-27 NOTE — Discharge Summary (Signed)
NAMEFAYE, Misty Blackwell NO.:  [id number]  MEDICAL RECORD NO.:  [id number]  LOCATION:  3708                         FACILITY:  [facility]  PHYSICIAN:  Landry Corporal, MD DATE OF BIRTH:  October 20, 1927  DATE OF ADMISSION:  02/21/2011 DATE OF DISCHARGE:  02/22/2011                              DISCHARGE SUMMARY   DISCHARGE DIAGNOSES: 1. Severe syncope/shortness of breath, nausea. 2. History of sick sinus syndrome, status post pacemaker implantation. 3. History of anxiety. 4. History of nonobstructive coronary artery disease. 5. Chronic atrial fibrillation with rate controlled. 6. History of three transient ischemic attacks in the past.  HOSPITAL COURSE:  Misty Blackwell is an 77 year old Caucasian female with a history of tachybrady syndrome with permanent Medtronic pacemaker, chronic atrial fibrillation, for which she takes Coumadin as well as history of TIAs, hypertension, and recent cholecystectomy.  [facility] after becoming weak and dizzy while fixing her husband's breakfast.  She was became short of breath, felt faint and nauseated.  She was reported being somewhat off-balance.  She ended up going to church and became more and more weak.  Upon presentation to the emergency room, she received 1 mg of IV Ativan, 4 mg of IV Zofran, had some mild nausea at that point when we saw her.  EKG showed paced rhythm and also showed some deep T-wave inversions in the lateral leads.  She does not have inverted T-waves.  The patient was admitted for observation to rule out acute coronary syndrome.  Cardiac enzymes were cycled, there were negative x3.  We also checked TSH, which was 0.698, digoxin level of 0.7, amylase 44, hemoglobin A1c of 6.0 and a lipase of 34.  Her INR was therapeutic.  She has mildly elevated alkaline phosphatase 140, LFTs normal.  As of this morning, the patient states that she feels better, she ambulating down the hall without  any difficulties, without any dizziness, shortness of breath, or nausea. Orthostatic blood pressures were checked this morning as well and were normal.  She has been seen by Dr. Herbie Baltimore, felt she is stable for discharge home and follow up with Dr. Clarene Duke at [facility].  DISCHARGE LABORATORY DATA:  WBC is 8.8, hemoglobin 12.6, hematocrit 38.9, platelets 215.  PT 31.3, INR 2.96.  Sodium 141, potassium 3.9, chloride 104, carbon dioxide 29, glucose 95, BUN 13, creatinine 0.67. Total bilirubin 0.7, alkaline phosphatase 140, AST 19, ALT 18, total protein 6.9, albumin 3.8, calcium 9.2, amylase 44, hemoglobin A1c 6.0, lipase 34.  Cardiac enzymes negative x3.  TSH 0.698, digoxin 0.27. Urinalysis showed small leukocytes and calcium oxalate crystals.  STUDIES/PROCEDURES:  Chest x-ray on February 21, 2011 showed cardiomegaly without acute process.  Lungs were clear.  DISCHARGE MEDICATIONS: 1. Furosemide 20 mg one tablet by mouth daily. 2. Alprazolam 1 mg one tablet by mouth 3 times a day as needed for     anxiety. 3. Aspirin 81 mg one tablet by mouth every morning. 4. Bystolic 5 mg at one and a half tablets every morning. 5. Digoxin 0.125 mg one tablet by mouth every morning. 6. Diltiazem CD/XT 360 mg one capsule by mouth every morning. 7. Fish  oil over-the-counter one tablet by mouth daily. 8. Klor-Con 20 mEq one tablet by mouth daily. 9. Nitroglycerin sublingual 0.4 mg one tablet under the tongue every 5     minutes up to 3 doses as needed for chest pain. 10.Pravachol 40 mg one half tablet by mouth daily at bedtime. 11.Vitamin D3 1000 units two tablets by mouth daily. 12.Warfarin 6 mg, takes one half tablet daily with the exception of     three-quarters of a tablet on Thursdays.  DISPOSITION:  Misty Blackwell will be discharged home in stable condition. It is recommended she increase her activity slowly.  It is recommended she eats a low-sodium, heart-healthy diet.  She will  follow up with Dr. Clarene Duke and our office will call with the appointment time.    ______________________________ Wilburt Finlay, PA   ______________________________ Landry Corporal, MD    BH/MEDQ  D:  02/22/2011  T:  02/23/2011  Job:  [id number]  cc:   Thereasa Solo. Little, M.D.  Electronically Signed by Wilburt Finlay PA on 02/26/2011 11:11:29 AM Electronically Signed by Bryan Lemma MD on 02/27/2011 01:31:49 AM

## 2011-05-04 HISTORY — PX: EYE SURGERY: SHX253

## 2011-06-04 ENCOUNTER — Encounter (HOSPITAL_COMMUNITY): Payer: Self-pay | Admitting: Emergency Medicine

## 2011-06-04 ENCOUNTER — Emergency Department (HOSPITAL_COMMUNITY): Payer: Medicare Other

## 2011-06-04 ENCOUNTER — Emergency Department (HOSPITAL_COMMUNITY)
Admission: EM | Admit: 2011-06-04 | Discharge: 2011-06-05 | Disposition: A | Discharge status: Home / Self Care | Payer: Medicare Other | Attending: Emergency Medicine | Admitting: Emergency Medicine

## 2011-06-04 DIAGNOSIS — Z8673 Personal history of transient ischemic attack (TIA), and cerebral infarction without residual deficits: Secondary | ICD-10-CM | POA: Insufficient documentation

## 2011-06-04 DIAGNOSIS — R0602 Shortness of breath: Secondary | ICD-10-CM | POA: Insufficient documentation

## 2011-06-04 DIAGNOSIS — R059 Cough, unspecified: Secondary | ICD-10-CM | POA: Insufficient documentation

## 2011-06-04 DIAGNOSIS — Z79899 Other long term (current) drug therapy: Secondary | ICD-10-CM | POA: Insufficient documentation

## 2011-06-04 DIAGNOSIS — R05 Cough: Secondary | ICD-10-CM | POA: Insufficient documentation

## 2011-06-04 DIAGNOSIS — I2510 Atherosclerotic heart disease of native coronary artery without angina pectoris: Secondary | ICD-10-CM | POA: Insufficient documentation

## 2011-06-04 DIAGNOSIS — R093 Abnormal sputum: Secondary | ICD-10-CM | POA: Insufficient documentation

## 2011-06-04 DIAGNOSIS — IMO0001 Reserved for inherently not codable concepts without codable children: Secondary | ICD-10-CM | POA: Insufficient documentation

## 2011-06-04 DIAGNOSIS — J40 Bronchitis, not specified as acute or chronic: Secondary | ICD-10-CM

## 2011-06-04 DIAGNOSIS — Z7982 Long term (current) use of aspirin: Secondary | ICD-10-CM | POA: Insufficient documentation

## 2011-06-04 DIAGNOSIS — R5381 Other malaise: Secondary | ICD-10-CM | POA: Insufficient documentation

## 2011-06-04 DIAGNOSIS — Z950 Presence of cardiac pacemaker: Secondary | ICD-10-CM | POA: Insufficient documentation

## 2011-06-04 DIAGNOSIS — Z859 Personal history of malignant neoplasm, unspecified: Secondary | ICD-10-CM | POA: Insufficient documentation

## 2011-06-04 HISTORY — DX: Unspecified atrial flutter: I48.92

## 2011-06-04 HISTORY — DX: Essential (primary) hypertension: I10

## 2011-06-04 HISTORY — DX: Reserved for concepts with insufficient information to code with codable children: IMO0002

## 2011-06-04 HISTORY — DX: Presence of cardiac pacemaker: Z95.0

## 2011-06-04 HISTORY — DX: Atherosclerotic heart disease of native coronary artery without angina pectoris: I25.10

## 2011-06-04 HISTORY — DX: Unspecified atrial fibrillation: I48.91

## 2011-06-04 HISTORY — DX: Transient cerebral ischemic attack, unspecified: G45.9

## 2011-06-04 LAB — DIFFERENTIAL
Basophils Absolute: 0.1 10*3/uL (ref 0.0–0.1)
Basophils Relative: 0 % (ref 0–1)
Lymphocytes Relative: 20 % (ref 12–46)
Monocytes Absolute: 1.5 10*3/uL — ABNORMAL HIGH (ref 0.1–1.0)
Neutro Abs: 9.4 10*3/uL — ABNORMAL HIGH (ref 1.7–7.7)
Neutrophils Relative %: 69 % (ref 43–77)

## 2011-06-04 LAB — COMPREHENSIVE METABOLIC PANEL
ALT: 20 U/L (ref 0–35)
AST: 23 U/L (ref 0–37)
Albumin: 3.4 g/dL — ABNORMAL LOW (ref 3.5–5.2)
Alkaline Phosphatase: 135 U/L — ABNORMAL HIGH (ref 39–117)
CO2: 27 mEq/L (ref 19–32)
Chloride: 98 mEq/L (ref 96–112)
GFR calc non Af Amer: 78 mL/min — ABNORMAL LOW (ref >90)
Potassium: 3.3 mEq/L — ABNORMAL LOW (ref 3.5–5.1)
Sodium: 137 mEq/L (ref 135–145)
Total Bilirubin: 1.1 mg/dL (ref 0.3–1.2)

## 2011-06-04 LAB — CBC
HCT: 43.8 % (ref 36.0–46.0)
RDW: 13.2 % (ref 11.5–15.5)
WBC: 13.6 10*3/uL — ABNORMAL HIGH (ref 4.0–10.5)

## 2011-06-04 LAB — URINALYSIS, MICROSCOPIC ONLY
Glucose, UA: NEGATIVE mg/dL
Protein, ur: 30 mg/dL — AB
Urobilinogen, UA: 2 mg/dL — ABNORMAL HIGH (ref 0.0–1.0)

## 2011-06-04 MED ORDER — AZITHROMYCIN 250 MG PO TABS: 250.0000 mg | ORAL_TABLET | Freq: Every day | ORAL | Status: AC

## 2011-06-04 MED ORDER — POTASSIUM CHLORIDE 20 MEQ/15ML (10%) PO LIQD
20.0000 meq | Freq: Once | ORAL | Status: AC
  Administered 2011-06-04: 20 meq via ORAL
  Filled 2011-06-04: qty 15

## 2011-06-04 MED ORDER — SODIUM CHLORIDE 0.9 % IV BOLUS (SEPSIS)
500.0000 mL | Freq: Once | INTRAVENOUS | Status: AC
  Administered 2011-06-04: 500 mL via INTRAVENOUS

## 2011-06-04 MED ORDER — BENZONATATE 100 MG PO CAPS: 100.0000 mg | ORAL_CAPSULE | Freq: Three times a day (TID) | ORAL | Status: AC

## 2011-06-04 NOTE — ED Notes (Signed)
Coughing so bad  X 1 week and sputum is thick not hungry

## 2011-06-04 NOTE — ED Provider Notes (Addendum)
History     CSN: [id number]  Arrival date & time 06/04/11  1351   First MD Initiated Contact with Patient 06/04/11 1554      Chief Complaint  Patient presents with  . Cough    (Consider location/radiation/quality/duration/timing/severity/associated sxs/prior treatment) HPI  83yoF h/o cor artery disease, hypertension, TIA since with cough. Patient states she has been coughing for approximately one week. She states she's coughing up dark green phlegm. She states she is short of breath only with coughing. She denies chest pain. She states that she's also had diffuse body aches and feels weak. She did have her flu and pneumococcal vaccination this season. She complains of subjective fevers and chills. There've been no sick contacts. She was sent here to the emergency department by her primary care doctor for further workup and evaluation. Denies hematuria/dysuria/freq/urgency   ED Notes, ED Provider Notes from 06/04/11 0000 to 06/04/11 14:20:25       Darrin Nipper Roughgarden, RN 06/04/2011 14:15      Coughing so bad X 1 week and sputum is thick not hungry      Past Medical History  Diagnosis Date  . Coronary artery disease   . Hypertension   . Atrial fib/flutter, transient   . Cancer   . Pacemaker   . TIA (transient ischemic attack)     History reviewed. No pertinent past surgical history.  No family history on file.  History  Substance Use Topics  . Smoking status: Never Smoker   . Smokeless tobacco: Not on file  . Alcohol Use: Yes    OB History    Grav Para Term Preterm Abortions TAB SAB Ect Mult Living                  Review of Systems  All other systems reviewed and are negative.   except as noted HPI   Allergies  Ace inhibitors and WUJ:WJXBJYNWGNF+AOZHYQMVH+QIONGEXBMW acid+aspartame  Home Medications   Current Outpatient Rx  Name Route Sig Dispense Refill  . ALPRAZOLAM 1 MG PO TABS Oral Take 1 mg by mouth at bedtime as needed. anxiety    . ASPIRIN EC 81 MG  PO TBEC Oral Take 81 mg by mouth daily.    Marland Kitchen VITAMIN D 1000 UNITS PO TABS Oral Take 2,000 Units by mouth 2 (two) times daily.    Marland Kitchen DIGOXIN 0.125 MG PO TABS Oral Take 125 mcg by mouth daily.    Marland Kitchen DILTIAZEM HCL ER BEADS 360 MG PO CP24 Oral Take 360 mg by mouth daily.    . OMEGA-3 FATTY ACIDS 1000 MG PO CAPS Oral Take 1 g by mouth daily.    . FUROSEMIDE 40 MG PO TABS Oral Take 20 mg by mouth daily.    . NEBIVOLOL HCL 5 MG PO TABS Oral Take 5 mg by mouth daily.    Marland Kitchen POTASSIUM CHLORIDE CRYS ER 20 MEQ PO TBCR Oral Take 10 mEq by mouth 2 (two) times daily.    Marland Kitchen PRAVASTATIN SODIUM 40 MG PO TABS Oral Take 20 mg by mouth daily.    . AZITHROMYCIN 250 MG PO TABS Oral Take 1 tablet (250 mg total) by mouth daily. Take first 2 tablets together, then 1 every day until finished. 6 tablet 0  . BENZONATATE 100 MG PO CAPS Oral Take 1 capsule (100 mg total) by mouth every 8 (eight) hours. 21 capsule 0    BP 167/88  Pulse 83  Temp(Src) 99.1 F (37.3 C) (Oral)  Resp 20  SpO2 94%  Physical Exam  Nursing note and vitals reviewed. Constitutional: She is oriented to person, place, and time. She appears well-developed.  HENT:  Head: Atraumatic.       Mm dry  Eyes: Conjunctivae and EOM are normal. Pupils are equal, round, and reactive to light.  Neck: Normal range of motion. Neck supple.  Cardiovascular: Normal rate, regular rhythm, normal heart sounds and intact distal pulses.   Pulmonary/Chest: Effort normal and breath sounds normal. No respiratory distress. She has no wheezes. She has no rales.       Min bibasilar crackles  Abdominal: Soft. She exhibits no distension. There is no tenderness. There is no rebound and no guarding.  Musculoskeletal: Normal range of motion.  Neurological: She is alert and oriented to person, place, and time.  Skin: Skin is warm and dry. No rash noted.  Psychiatric: She has a normal mood and affect.    ED Course  Procedures (including critical care time)  Labs Reviewed    CBC - Abnormal; Notable for the following:    WBC 13.6 (*)    All other components within normal limits  DIFFERENTIAL - Abnormal; Notable for the following:    Neutro Abs 9.4 (*)    Monocytes Absolute 1.5 (*)    All other components within normal limits  COMPREHENSIVE METABOLIC PANEL - Abnormal; Notable for the following:    Potassium 3.3 (*)    Albumin 3.4 (*)    Alkaline Phosphatase 135 (*)    GFR calc non Af Amer 78 (*)    All other components within normal limits  URINALYSIS, WITH MICROSCOPIC - Abnormal; Notable for the following:    Color, Urine AMBER (*) BIOCHEMICALS MAY BE AFFECTED BY COLOR   APPearance CLOUDY (*)    Hgb urine dipstick MODERATE (*)    Bilirubin Urine MODERATE (*)    Ketones, ur 15 (*)    Protein, ur 30 (*)    Urobilinogen, UA 2.0 (*)    Leukocytes, UA TRACE (*)    Squamous Epithelial / LPF FEW (*)    Crystals CA OXALATE CRYSTALS (*)    All other components within normal limits  URINE CULTURE   Dg Chest 2 View  06/04/2011  *RADIOLOGY REPORT*  Clinical Data: Cough  CHEST - 2 VIEW  Comparison: 02/21/2011  Findings: Moderate cardiomegaly.  Left subclavian dual lead pacemaker device and leads are unchanged.  Lungs are hyperaerated. No pneumothorax and no pleural effusion.  IMPRESSION: Cardiomegaly without edema.  Findings related to COPD.  Original Report Authenticated By: Donavan Burnet, M.D.    1. Bronchitis     MDM  She presents with body aches, chills, cough. Her chest x-ray is negative for pneumonia. This could be a mild form of influenza with bronchitis. She does appear dehydrated and recently had anorexia. We'll hydrate. Check UA for possible urinary tract infection given weakness and likely discharge home with primary care followup.  UA with bilirubin, however bilirubin not elevated (see above labs). Contaminated sample. Pt feeling better s/p 500cc IVF. Will discharge home with azithromycin, tessalon, PMD f/u. Precautions for return. Home with  daughter.  12:04 AM  Called by Pharmacy. Concern for azithromycin interaction with digoxin. Pharmacy out of levaquin, Changed to avelox.             Forbes Cellar, MD 06/05/11 0005  Forbes Cellar, MD 06/05/11 0454

## 2012-07-18 ENCOUNTER — Encounter: Payer: Self-pay | Admitting: Internal Medicine

## 2012-07-18 ENCOUNTER — Ambulatory Visit: Payer: Self-pay | Admitting: Cardiovascular Disease

## 2012-07-18 DIAGNOSIS — I4891 Unspecified atrial fibrillation: Secondary | ICD-10-CM | POA: Insufficient documentation

## 2012-07-18 DIAGNOSIS — Z7901 Long term (current) use of anticoagulants: Secondary | ICD-10-CM | POA: Insufficient documentation

## 2012-07-18 NOTE — Progress Notes (Signed)
This encounter was created in error - please disregard.

## 2012-09-11 ENCOUNTER — Encounter: Payer: Self-pay | Admitting: Pharmacist Clinician (PhC)/ Clinical Pharmacy Specialist

## 2012-09-12 ENCOUNTER — Ambulatory Visit (INDEPENDENT_AMBULATORY_CARE_PROVIDER_SITE_OTHER): Payer: Medicare Other | Admitting: Pharmacist Clinician (PhC)/ Clinical Pharmacy Specialist

## 2012-09-12 VITALS — BP 132/72 | HR 76

## 2012-09-12 DIAGNOSIS — Z7901 Long term (current) use of anticoagulants: Secondary | ICD-10-CM

## 2012-09-12 DIAGNOSIS — I4891 Unspecified atrial fibrillation: Secondary | ICD-10-CM

## 2012-09-12 LAB — POCT INR: INR: 2.1

## 2012-09-12 NOTE — Patient Instructions (Signed)
Continue current dose, repeat INR in 4 weeks

## 2012-09-12 NOTE — Progress Notes (Signed)
thanks

## 2012-09-13 ENCOUNTER — Encounter: Payer: Self-pay | Admitting: Cardiovascular Disease

## 2012-09-14 ENCOUNTER — Ambulatory Visit (INDEPENDENT_AMBULATORY_CARE_PROVIDER_SITE_OTHER): Payer: Medicare Other | Admitting: Cardiovascular Disease

## 2012-09-14 ENCOUNTER — Encounter: Payer: Self-pay | Admitting: Cardiovascular Disease

## 2012-09-14 VITALS — BP 136/74 | HR 66 | Resp 20 | Ht 64.0 in | Wt 152.1 lb

## 2012-09-14 DIAGNOSIS — E785 Hyperlipidemia, unspecified: Secondary | ICD-10-CM

## 2012-09-14 DIAGNOSIS — I4891 Unspecified atrial fibrillation: Secondary | ICD-10-CM

## 2012-09-14 DIAGNOSIS — I2510 Atherosclerotic heart disease of native coronary artery without angina pectoris: Secondary | ICD-10-CM

## 2012-09-14 DIAGNOSIS — R079 Chest pain, unspecified: Secondary | ICD-10-CM

## 2012-09-14 DIAGNOSIS — Z950 Presence of cardiac pacemaker: Secondary | ICD-10-CM

## 2012-09-14 DIAGNOSIS — Z7901 Long term (current) use of anticoagulants: Secondary | ICD-10-CM

## 2012-09-14 DIAGNOSIS — I10 Essential (primary) hypertension: Secondary | ICD-10-CM

## 2012-09-14 NOTE — Progress Notes (Signed)
[name], female   DOB: 06/04/1927, 79 y.o.   MRN: [id number]  HPI Misty Blackwell is 79 years old and returns in followup for atrial for ablation with slow ventricular response status post pacemaker implantation severe systemic hypertension mild pulmonary hypertension and fairly minor valvular abnormalities.  Lots of stress at home. Husband is nearly blind and has dementia. Role of caretaker is overwhelming as he has become verbally abusive and easily angered. She is considering nursing home placement  Allergies  Allergen Reactions  . Ace Inhibitors   . Amoxicillin-Pot Clavulanate     Current Outpatient Prescriptions  Medication Sig Dispense Refill  . ALPRAZolam (XANAX) 1 MG tablet Take 1 mg by mouth at bedtime as needed. anxiety      . aspirin EC 81 MG tablet Take 81 mg by mouth daily.      . carvedilol (COREG) 6.25 MG tablet Take 6.25 mg by mouth 2 (two) times daily with a meal.      . cholecalciferol (VITAMIN D) 1000 UNITS tablet Take 2,000 Units by mouth 2 (two) times daily.      Marland Kitchen diltiazem (TIAZAC) 360 MG 24 hr capsule Take 360 mg by mouth daily.      . fish oil-omega-3 fatty acids 1000 MG capsule Take 1 g by mouth daily.      . furosemide (LASIX) 40 MG tablet Take 20 mg by mouth daily.      Marland Kitchen gabapentin (NEURONTIN) 100 MG capsule Take 100 mg by mouth 3 (three) times daily.      Marland Kitchen glucosamine-chondroitin 500-400 MG tablet Take 1 tablet by mouth daily.      . potassium chloride SA (K-DUR,KLOR-CON) 20 MEQ tablet Take 10 mEq by mouth 2 (two) times daily.      Marland Kitchen warfarin (COUMADIN) 6 MG tablet Take 6 mg by mouth daily. Take as directed      . digoxin (LANOXIN) 0.125 MG tablet Take 125 mcg by mouth daily.      . nebivolol (BYSTOLIC) 5 MG tablet Take 5 mg by mouth daily.      . pravastatin (PRAVACHOL) 40 MG tablet Take 20 mg by mouth daily.       No current facility-administered medications for this visit.    Past Medical History  Diagnosis Date  . Coronary artery  disease 03/19/2002    R/P Cardiolite - EF 76%; nromal static and dynamic myocardial perfusion images; normal wall motion and endocardial thickening in all vascular territories  . Hypertension   . Atrial fib/flutter, transient   . Cancer   . Pacemaker   . TIA (transient ischemic attack)   . CHF (congestive heart failure) 10/29/2009    Echo - EF >55%; normal LV size and systolic function; unable to assess diastolic fcn due to E/A fusion, pulmonary vein flow pattern suggests elevated filling pressure; marked biatrail dilation, mild/mod tricuspid regurgitation; mod pulmonary htn; mild/mod mitral regurgitation; although echocardiographic features are incomplete findings suggest possible infiltrative cardiomyopathy (maybe amyloidosi  . Facial numbness 12/26/2008    carotid doppler - R and L ICAs 0-49% diameter reduction (velocities suggest low end of scale)  . Atrial fibrillation, chronic   . Peripheral neuropathy     Past Surgical History  Procedure Laterality Date  . Cardioversion  11/19/2009    successful DCCV from AF to sinus type rhythm  . Cardiac catheterization  08/06/2005    minimal coronary disease predominant RCA; no significant atherosclerosis; new onset sick sinus syndrome and atrial flutter w/ ventricular  response, controlled on med therapy; systemic HTN, normal renal arteries    Family History  Problem Relation Age of Onset  . Heart disease Mother   . Diabetes Mother   . Heart attack Father   . Heart disease Father   . Cirrhosis Brother     History   Social History  . Marital Status: Married    Spouse Name: N/A    Number of Children: N/A  . Years of Education: N/A   Occupational History  . Not on file.   Social History Main Topics  . Smoking status: Never Smoker   . Smokeless tobacco: Not on file  . Alcohol Use: No  . Drug Use: No  . Sexually Active: Not on file   Other Topics Concern  . Not on file   Social History Narrative  . No narrative on file     ROS:The patient specifically denies any chest pain at rest exertion, dyspnea at rest or with exertion, orthopnea, paroxysmal nocturnal dyspnea, syncope, palpitations, focal neurological deficits, intermittent claudication, lower extremity edema, unexplained weight gain, cough, hemoptysis or wheezing.   PHYSICAL EXAM BP 136/74  Pulse 66  Ht 5\' 4"  (1.626 m)  Wt 152 lb 1.6 oz (68.992 kg)  BMI 26.1 kg/m2  General: Alert, oriented x3, no distress Head: no evidence of trauma, PERRL, EOMI, no exophtalmos or lid lag, no myxedema, no xanthelasma; normal ears, nose and oropharynx Neck: normal jugular venous pulsations and no hepatojugular reflux; brisk carotid pulses without delay and no carotid bruits Chest: clear to auscultation, no signs of consolidation by percussion or palpation, normal fremitus, symmetrical and full respiratory excursions. Left subclavian pacemaker site appears healthy Cardiovascular: normal position and quality of the apical impulse, regular rhythm with occasional irregularity, normal first and second heart sounds, no murmurs, rubs or gallops Abdomen: no tenderness or distention, no masses by palpation, no abnormal pulsatility or arterial bruits, normal bowel sounds, no hepatosplenomegaly Extremities: no clubbing, cyanosis or edema; 2+ radial, ulnar and brachial pulses bilaterally; 2+ right femoral, posterior tibial and dorsalis pedis pulses; 2+ left femoral, posterior tibial and dorsalis pedis pulses; no subclavian or femoral bruits Neurological: grossly nonfocal   EKG: Atrial fibrillation with slow ventricular response and intermittent ventricular paced beats  Please see separate note for device interrogation parameters. Her pacemaker is a Medtronic adapta dual-chamber device implanted in April 2011 currently functioning as a single-chamber device in DDIR mode. There is roughly 86% ventricular pacing comparable with historical percentages there episodes of ventricular high  rates are seen. The longest lasted 25 seconds. Because of its brevity is very difficult to distinguish atrial fib with rapid ventricular response from nonsustained and treat her tachycardia but the latter is favored.  Lipid Panel     Component Value Date/Time   CHOL  Value: 171        ATP III CLASSIFICATION:  <200     mg/dL   Desirable  161-096  mg/dL   Borderline High  >=045    mg/dL   High        08/09/8117 1800   TRIG 93 06/20/2009 1800   HDL 34* 06/20/2009 1800   CHOLHDL 5.0 06/20/2009 1800   VLDL 19 06/20/2009 1800   LDLCALC  Value: 118        Total Cholesterol/HDL:CHD Risk Coronary Heart Disease Risk Table                     Men   Women  1/2 Average Risk  3.4   3.3  Average Risk       5.0   4.4  2 X Average Risk   9.6   7.1  3 X Average Risk  23.4   11.0        Use the calculated Patient Ratio above and the CHD Risk Table to determine the patient's CHD Risk.        ATP III CLASSIFICATION (LDL):  <100     mg/dL   Optimal  161-096  mg/dL   Near or Above                    Optimal  130-159  mg/dL   Borderline  045-409  mg/dL   High  >811     mg/dL   Very High* 01/15/7828 1800    BMET    Component Value Date/Time   NA 137 06/04/2011 1456   K 3.3* 06/04/2011 1456   CL 98 06/04/2011 1456   CO2 27 06/04/2011 1456   GLUCOSE 93 06/04/2011 1456   BUN 13 06/04/2011 1456   CREATININE 0.73 06/04/2011 1456   CALCIUM 9.2 06/04/2011 1456   GFRNONAA 78* 06/04/2011 1456   GFRAA >90 06/04/2011 1456     ASSESSMENT AND PLAN  Atrial fibrillation She is on appropriate anti-coagulation therapy with warfarin with recent therapeutic lNR. of 2.1 on the current dosage. Part of the reason for frequent ventricular pacing is the use of beta blockers and calcium channel blockers which have proven to be necessary for blood pressure control. She does not exhibit any of the adverse effects of right ventricular pacing  HYPERTENSION She is quite emotional today as she ponders over the future of her husband's care. I think this is the  cause for her elevated blood pressure. The current system of medications has proven successful until today and I made no changes  DYSLIPIDEMIA Her most recent lipid profile shows unsatisfactorily high levels of LDL cholesterol. Since her last appointment her pravastatin has been discontinued and I'm not sure why. She has well-established peripheral vascular disease and also had minor coronary disease at cardiac catheterization. I think statin therapy is important but I will first try to find out why it was stopped.  Pacemaker Normal pacemaker function. The only adjustment made today was determined ventricular electrogram collection to help distinguish rapid ventricular response for nonsustained ventricular tachycardia. Followup will be via the remote [facility] system every 3 months with an office visit in 12 months     Hamzah Savoca

## 2012-09-14 NOTE — Patient Instructions (Addendum)
Download pacemaker reports every 3 months as per schedule provided. Follow up with Coumadin clinic as scheduled. Follow up in one year (08/2013) with Dr. Royann Shivers

## 2012-09-14 NOTE — Progress Notes (Signed)
Single chamber pacemaker check. Normal device function. No programming changes. Will follow up remotely.

## 2012-09-19 ENCOUNTER — Encounter: Payer: Self-pay | Admitting: Cardiovascular Disease

## 2012-09-19 DIAGNOSIS — Z950 Presence of cardiac pacemaker: Secondary | ICD-10-CM | POA: Insufficient documentation

## 2012-09-19 NOTE — Assessment & Plan Note (Signed)
Her most recent lipid profile shows unsatisfactorily high levels of LDL cholesterol. Since her last appointment her pravastatin has been discontinued and I'm not sure why. She has well-established peripheral vascular disease and also had minor coronary disease at cardiac catheterization. I think statin therapy is important but I will first try to find out why it was stopped.

## 2012-09-19 NOTE — Assessment & Plan Note (Addendum)
Normal pacemaker function. The only adjustment made today was determined ventricular electrogram collection to help distinguish rapid ventricular response for nonsustained ventricular tachycardia. Followup will be via the remote [facility] system every 3 months with an office visit in 12 months

## 2012-09-19 NOTE — Assessment & Plan Note (Signed)
She is quite emotional today as she ponders over the future of her husband's care. I think this is the cause for her elevated blood pressure. The current system of medications has proven successful until today and I made no changes

## 2012-09-19 NOTE — Assessment & Plan Note (Addendum)
She is on appropriate anti-coagulation therapy with warfarin with recent therapeutic lNR. of 2.1 on the current dosage. Part of the reason for frequent ventricular pacing is the use of beta blockers and calcium channel blockers which have proven to be necessary for blood pressure control. She does not exhibit any of the adverse effects of right ventricular pacing

## 2012-10-10 ENCOUNTER — Ambulatory Visit (INDEPENDENT_AMBULATORY_CARE_PROVIDER_SITE_OTHER): Payer: Medicare Other | Admitting: Pharmacist Clinician (PhC)/ Clinical Pharmacy Specialist

## 2012-10-10 VITALS — BP 138/80 | HR 92

## 2012-10-10 DIAGNOSIS — I4891 Unspecified atrial fibrillation: Secondary | ICD-10-CM

## 2012-10-10 DIAGNOSIS — Z7901 Long term (current) use of anticoagulants: Secondary | ICD-10-CM

## 2012-10-11 ENCOUNTER — Telehealth: Payer: Self-pay | Admitting: *Deleted

## 2012-10-11 NOTE — Telephone Encounter (Signed)
LM informing patient that the information that she was given from [place], PharmD yesterday, in my absence, was the correct information about how to set up her [facility] monitor. I encouraged the patient to call back with any further questions.

## 2012-11-07 ENCOUNTER — Ambulatory Visit (INDEPENDENT_AMBULATORY_CARE_PROVIDER_SITE_OTHER): Payer: Medicare Other | Admitting: Pharmacist Clinician (PhC)/ Clinical Pharmacy Specialist

## 2012-11-07 VITALS — BP 140/88 | HR 92

## 2012-11-07 DIAGNOSIS — I4891 Unspecified atrial fibrillation: Secondary | ICD-10-CM

## 2012-11-07 DIAGNOSIS — Z7901 Long term (current) use of anticoagulants: Secondary | ICD-10-CM

## 2012-11-07 LAB — POCT INR: INR: 2.7

## 2012-12-12 ENCOUNTER — Ambulatory Visit (INDEPENDENT_AMBULATORY_CARE_PROVIDER_SITE_OTHER): Payer: Medicare Other | Admitting: Pharmacist Clinician (PhC)/ Clinical Pharmacy Specialist

## 2012-12-12 VITALS — BP 130/78 | HR 68

## 2012-12-12 DIAGNOSIS — I4891 Unspecified atrial fibrillation: Secondary | ICD-10-CM

## 2012-12-12 DIAGNOSIS — Z7901 Long term (current) use of anticoagulants: Secondary | ICD-10-CM

## 2012-12-12 LAB — POCT INR: INR: 2.7

## 2013-01-09 ENCOUNTER — Ambulatory Visit (INDEPENDENT_AMBULATORY_CARE_PROVIDER_SITE_OTHER): Payer: Medicare Other | Admitting: Pharmacist Clinician (PhC)/ Clinical Pharmacy Specialist

## 2013-01-09 VITALS — BP 160/88 | HR 88

## 2013-01-09 DIAGNOSIS — Z7901 Long term (current) use of anticoagulants: Secondary | ICD-10-CM

## 2013-01-09 DIAGNOSIS — I4891 Unspecified atrial fibrillation: Secondary | ICD-10-CM

## 2013-01-09 LAB — POCT INR: INR: 4.3

## 2013-01-23 ENCOUNTER — Ambulatory Visit (INDEPENDENT_AMBULATORY_CARE_PROVIDER_SITE_OTHER): Payer: Medicare Other | Admitting: Pharmacist Clinician (PhC)/ Clinical Pharmacy Specialist

## 2013-01-23 VITALS — BP 148/82 | HR 92

## 2013-01-23 DIAGNOSIS — I4891 Unspecified atrial fibrillation: Secondary | ICD-10-CM

## 2013-01-23 DIAGNOSIS — Z7901 Long term (current) use of anticoagulants: Secondary | ICD-10-CM

## 2013-01-23 LAB — POCT INR: INR: 2.2

## 2013-02-14 ENCOUNTER — Other Ambulatory Visit: Payer: Self-pay | Admitting: *Deleted

## 2013-02-14 MED ORDER — DILTIAZEM HCL ER BEADS 360 MG PO CP24: 360.0000 mg | ORAL_CAPSULE | Freq: Every day | ORAL | Status: DC

## 2013-02-14 MED ORDER — WARFARIN SODIUM 6 MG PO TABS: 6.0000 mg | ORAL_TABLET | Freq: Every day | ORAL | Status: DC

## 2013-02-20 ENCOUNTER — Ambulatory Visit (INDEPENDENT_AMBULATORY_CARE_PROVIDER_SITE_OTHER): Payer: Medicare Other | Admitting: Pharmacist Clinician (PhC)/ Clinical Pharmacy Specialist

## 2013-02-20 VITALS — BP 130/70

## 2013-02-20 DIAGNOSIS — I4891 Unspecified atrial fibrillation: Secondary | ICD-10-CM

## 2013-02-20 DIAGNOSIS — Z7901 Long term (current) use of anticoagulants: Secondary | ICD-10-CM

## 2013-02-20 LAB — POCT INR: INR: 3

## 2013-03-20 ENCOUNTER — Ambulatory Visit (INDEPENDENT_AMBULATORY_CARE_PROVIDER_SITE_OTHER): Payer: Medicare Other | Admitting: Pharmacist Clinician (PhC)/ Clinical Pharmacy Specialist

## 2013-03-20 VITALS — BP 140/84 | HR 92

## 2013-03-20 DIAGNOSIS — Z7901 Long term (current) use of anticoagulants: Secondary | ICD-10-CM

## 2013-03-20 DIAGNOSIS — I4891 Unspecified atrial fibrillation: Secondary | ICD-10-CM

## 2013-04-17 ENCOUNTER — Ambulatory Visit (INDEPENDENT_AMBULATORY_CARE_PROVIDER_SITE_OTHER): Payer: Medicare Other | Admitting: Pharmacist Clinician (PhC)/ Clinical Pharmacy Specialist

## 2013-04-17 VITALS — BP 130/76 | HR 88

## 2013-04-17 DIAGNOSIS — Z7901 Long term (current) use of anticoagulants: Secondary | ICD-10-CM

## 2013-04-17 DIAGNOSIS — I4891 Unspecified atrial fibrillation: Secondary | ICD-10-CM

## 2013-04-17 LAB — POCT INR: INR: 2.1

## 2013-05-13 ENCOUNTER — Encounter: Payer: Self-pay | Admitting: *Deleted

## 2013-05-15 ENCOUNTER — Ambulatory Visit (INDEPENDENT_AMBULATORY_CARE_PROVIDER_SITE_OTHER): Payer: Medicare Other | Admitting: Pharmacist Clinician (PhC)/ Clinical Pharmacy Specialist

## 2013-05-15 VITALS — BP 140/66 | HR 88

## 2013-05-15 DIAGNOSIS — I4891 Unspecified atrial fibrillation: Secondary | ICD-10-CM

## 2013-05-15 DIAGNOSIS — Z7901 Long term (current) use of anticoagulants: Secondary | ICD-10-CM

## 2013-05-15 LAB — POCT INR: INR: 1.5

## 2013-05-16 ENCOUNTER — Ambulatory Visit: Payer: Self-pay | Admitting: Emergency Medicine

## 2013-05-21 ENCOUNTER — Other Ambulatory Visit: Payer: Self-pay | Admitting: Cardiovascular Disease

## 2013-05-21 ENCOUNTER — Ambulatory Visit: Payer: Medicare Other | Admitting: Emergency Medicine

## 2013-05-21 ENCOUNTER — Encounter: Payer: Self-pay | Admitting: Emergency Medicine

## 2013-05-21 VITALS — BP 120/64 | HR 72 | Temp 97.8°F | Resp 16 | Ht 64.75 in | Wt 150.0 lb

## 2013-05-21 DIAGNOSIS — R7309 Other abnormal glucose: Secondary | ICD-10-CM

## 2013-05-21 DIAGNOSIS — I10 Essential (primary) hypertension: Secondary | ICD-10-CM

## 2013-05-21 DIAGNOSIS — Z23 Encounter for immunization: Secondary | ICD-10-CM

## 2013-05-21 DIAGNOSIS — E782 Mixed hyperlipidemia: Secondary | ICD-10-CM

## 2013-05-21 DIAGNOSIS — E559 Vitamin D deficiency, unspecified: Secondary | ICD-10-CM

## 2013-05-21 LAB — BASIC METABOLIC PANEL WITH GFR
BUN: 19 mg/dL (ref 6–23)
CO2: 29 mEq/L (ref 19–32)
CREATININE: 0.71 mg/dL (ref 0.50–1.10)
Calcium: 9.2 mg/dL (ref 8.4–10.5)
Chloride: 103 mEq/L (ref 96–112)
GFR, Est African American: >89 mL/min
GFR, Est Non African American: 79 mL/min
GLUCOSE: 78 mg/dL (ref 70–99)
Potassium: 4.3 mEq/L (ref 3.5–5.3)
Sodium: 138 mEq/L (ref 135–145)

## 2013-05-21 LAB — HEMOGLOBIN A1C
Hgb A1c MFr Bld: 5.8 % — ABNORMAL HIGH (ref <5.7)
Mean Plasma Glucose: 120 mg/dL — ABNORMAL HIGH (ref <117)

## 2013-05-21 LAB — LIPID PANEL
CHOLESTEROL: 168 mg/dL (ref 0–200)
HDL: 39 mg/dL — AB (ref >39)
LDL Cholesterol: 101 mg/dL — ABNORMAL HIGH (ref 0–99)
TRIGLYCERIDES: 141 mg/dL (ref <150)
Total CHOL/HDL Ratio: 4.3 Ratio
VLDL: 28 mg/dL (ref 0–40)

## 2013-05-21 LAB — HEPATIC FUNCTION PANEL
ALBUMIN: 4.2 g/dL (ref 3.5–5.2)
ALT: 21 U/L (ref 0–35)
AST: 21 U/L (ref 0–37)
Alkaline Phosphatase: 142 U/L — ABNORMAL HIGH (ref 39–117)
BILIRUBIN INDIRECT: 0.4 mg/dL (ref 0.0–0.9)
Bilirubin, Direct: 0.2 mg/dL (ref 0.0–0.3)
TOTAL PROTEIN: 6.8 g/dL (ref 6.0–8.3)
Total Bilirubin: 0.6 mg/dL (ref 0.3–1.2)

## 2013-05-21 LAB — CBC WITH DIFFERENTIAL/PLATELET
BASOS ABS: 0.1 10*3/uL (ref 0.0–0.1)
Basophils Relative: 1 % (ref 0–1)
EOS PCT: 1 % (ref 0–5)
Eosinophils Absolute: 0.1 10*3/uL (ref 0.0–0.7)
HEMATOCRIT: 43.5 % (ref 36.0–46.0)
Hemoglobin: 14.7 g/dL (ref 12.0–15.0)
LYMPHS PCT: 33 % (ref 12–46)
Lymphs Abs: 2.6 10*3/uL (ref 0.7–4.0)
MCH: 30 pg (ref 26.0–34.0)
MCHC: 33.8 g/dL (ref 30.0–36.0)
MCV: 88.8 fL (ref 78.0–100.0)
MONO ABS: 0.8 10*3/uL (ref 0.1–1.0)
Monocytes Relative: 10 % (ref 3–12)
Neutro Abs: 4.3 10*3/uL (ref 1.7–7.7)
Neutrophils Relative %: 55 % (ref 43–77)
Platelets: 248 10*3/uL (ref 150–400)
RBC: 4.9 MIL/uL (ref 3.87–5.11)
RDW: 13.5 % (ref 11.5–15.5)
WBC: 7.8 10*3/uL (ref 4.0–10.5)

## 2013-05-21 LAB — MAGNESIUM: Magnesium: 2 mg/dL (ref 1.5–2.5)

## 2013-05-21 NOTE — Progress Notes (Signed)
Subjective:    [name], female    DOB: March 02, 1928, 80 y.o.   MRN: [id number]  HPI Comments: 80 YO female presents for 3 month F/U for HTN, Cholesterol, Pre-Dm, D. Deficient. She tries to eat healthy. She denies cardio but notes she is really busy with caring for family. LAST LABS T 164 TG 136 L 97  ALK PHOS 165 A1C 6.0 MAG 1.9 D 51.  Recent UTI treated with Cipro was seen by Dr Suann Larry and he stopped ABX and noted she will always have low grade infection and no treatment necessary unless SX increase. She has f/u in 5/15 with [facility]  + STRESS with husband and sister and has to occasionally take extra xanax. She notes extra xanax helps. She notes she feels more tired when stress is increased but recovers with rest.   Hypertension  Atrial Fibrillation Symptoms include hypertension. Past medical history includes atrial fibrillation and hyperlipidemia.  Hyperlipidemia   Current Outpatient Prescriptions on File Prior to Visit  Medication Sig Dispense Refill  . ALPRAZolam (XANAX) 1 MG tablet Take 1 mg by mouth 2 (two) times daily as needed. anxiety      . aspirin EC 81 MG tablet Take 81 mg by mouth daily.      . carvedilol (COREG) 6.25 MG tablet Take 12.5 mg by mouth 2 (two) times daily with a meal.       . cholecalciferol (VITAMIN D) 1000 UNITS tablet Take 2,000 Units by mouth 2 (two) times daily.      Marland Kitchen diltiazem (TIAZAC) 360 MG 24 hr capsule Take 1 capsule (360 mg total) by mouth daily.  30 capsule  6  . fish oil-omega-3 fatty acids 1000 MG capsule Take 1 g by mouth daily.      . furosemide (LASIX) 40 MG tablet Take 20 mg by mouth daily.      Marland Kitchen glucosamine-chondroitin 500-400 MG tablet Take 1 tablet by mouth daily.      . magnesium gluconate (MAGONATE) 500 MG tablet Take 500 mg by mouth daily.      Marland Kitchen pyridOXINE (VITAMIN B-6) 50 MG tablet Take 50 mg by mouth daily.      Marland Kitchen warfarin (COUMADIN) 6 MG tablet Take 1 tablet (6 mg total) by mouth daily. Take as directed  30  tablet  6  . citalopram (CELEXA) 20 MG tablet Take 20 mg by mouth daily.      . digoxin (LANOXIN) 0.125 MG tablet Take by mouth daily.      . nebivolol (BYSTOLIC) 5 MG tablet Take 5 mg by mouth daily.      . pravastatin (PRAVACHOL) 40 MG tablet Take 20 mg by mouth daily.       No current facility-administered medications on file prior to visit.   ALLERGIES Ace inhibitors; Acrylic polymer; Amoxicillin-pot clavulanate; Augmentin; Chocolate; Ciprofloxacin; Gabapentin; Levaquin; and Zocor  Past Medical History  Diagnosis Date  . Coronary artery disease 03/19/2002    R/P Cardiolite - EF 76%; nromal static and dynamic myocardial perfusion images; normal wall motion and endocardial thickening in all vascular territories  . Hypertension   . Atrial fib/flutter, transient   . Cancer   . Pacemaker   . TIA (transient ischemic attack)   . CHF (congestive heart failure) 10/29/2009    Echo - EF >55%; normal LV size and systolic function; unable to assess diastolic fcn due to E/A fusion, pulmonary vein flow pattern suggests elevated filling pressure; marked biatrail dilation, mild/mod tricuspid regurgitation;  mod pulmonary htn; mild/mod mitral regurgitation; although echocardiographic features are incomplete findings suggest possible infiltrative cardiomyopathy (maybe amyloidosi  . Facial numbness 12/26/2008    carotid doppler - R and L ICAs 0-49% diameter reduction (velocities suggest low end of scale)  . Atrial fibrillation, chronic   . Peripheral neuropathy      Review of Systems  Constitutional: Positive for fatigue.  Psychiatric/Behavioral: The patient is nervous/anxious.   All other systems reviewed and are negative.   BP 120/64  Pulse 72  Temp(Src) 97.8 F (36.6 C) (Temporal)  Resp 16  Ht 5' 4.75" (1.645 m)  Wt 150 lb (68.04 kg)  BMI 25.14 kg/m2     Objective:   Physical Exam  Nursing note and vitals reviewed. Constitutional: She is oriented to person, place, and time. She  appears well-developed and well-nourished. No distress.  HENT:  Head: Normocephalic and atraumatic.  Right Ear: External ear normal.  Left Ear: External ear normal.  Nose: Nose normal.  Mouth/Throat: Oropharynx is clear and moist.  Eyes: Conjunctivae and EOM are normal.  Neck: Normal range of motion. Neck supple. No JVD present. No thyromegaly present.  Cardiovascular: Normal rate, regular rhythm, normal heart sounds and intact distal pulses.   Pulmonary/Chest: Effort normal and breath sounds normal.  Abdominal: Soft. Bowel sounds are normal. She exhibits no distension and no mass. There is no tenderness. There is no rebound and no guarding.  Musculoskeletal: Normal range of motion. She exhibits no edema and no tenderness.  Lymphadenopathy:    She has no cervical adenopathy.  Neurological: She is alert and oriented to person, place, and time. No cranial nerve deficit.  Skin: Skin is warm and dry. No rash noted. No erythema. No pallor.  Psychiatric: She has a normal mood and affect. Her behavior is normal. Judgment and thought content normal.          Assessment & Plan:  1.  3 month F/U for HTN, Cholesterol, Pre-Dm, D. Deficient. Needs healthy diet, cardio QD and obtain healthy weight. Check Labs, Check BP if >130/80 call office 2. Stress/ anxiety- Needs to request help with care for family. Info given. She w/c if sx increase. 3. Residual UTI- per [facility] no treatment unless SX

## 2013-05-21 NOTE — Patient Instructions (Signed)
Urinary Incontinence Urinary incontinence is the involuntary loss of urine from your bladder. CAUSES  There are many causes of urinary incontinence. They include:  Medicines.  Infections.  Prostatic enlargement, leading to overflow of urine from your bladder.  Surgery.  Neurological diseases.  Emotional factors. SIGNS AND SYMPTOMS Urinary Incontinence can be divided into four types: 1. Urge incontinence. Urge incontinence is the involuntary loss of urine before you have the opportunity to go to the bathroom. There is a sudden urge to void but not enough time to reach a bathroom. 2. Stress incontinence. Stress incontinence is the sudden loss of urine with any activity that forces urine to pass. It is commonly caused by anatomical changes to the pelvis and sphincter areas of your body. 3. Overflow incontinence. Overflow incontinence is the loss of urine from an obstructed opening to your bladder. This results in a backup of urine and a resultant buildup of pressure within the bladder. When the pressure within the bladder exceeds the closing pressure of the sphincter, the urine overflows, which causes incontinence, similar to water overflowing a dam. 4. Total incontinence. Total incontinence is the loss of urine as a result of the inability to store urine within your bladder. DIAGNOSIS  Evaluating the cause of incontinence may require:  A thorough and complete medical and obstetric history.  A complete physical exam.  Laboratory tests such as a urine culture and sensitivities. When additional tests are indicated, they can include:  An ultrasound exam.  Kidney and bladder X-rays.  Cystoscopy. This is an exam of the bladder using a narrow scope.  Urodynamic testing to test the nerve function to the bladder and sphincter areas. TREATMENT  Treatment for urinary incontinence depends on the cause:  For urge incontinence caused by a bacterial infection, antibiotics will be prescribed.  If the urge incontinence is related to medicines you take, your health care provider may have you change the medicine.  For stress incontinence, surgery to re-establish anatomical support to the bladder or sphincter, or both, will often correct the condition.  For overflow incontinence caused by an enlarged prostate, an operation to open the channel through the enlarged prostate will allow the flow of urine out of the bladder. In women with fibroids, a hysterectomy may be recommended.  For total incontinence, surgery on your urinary sphincter may help. An artificial urinary sphincter (an inflatable cuff placed around the urethra) may be required. In women who have developed a hole-like passage between their bladder and vagina (vesicovaginal fistula), surgery to close the fistula often is required. HOME CARE INSTRUCTIONS  Normal daily hygiene and the use of pads or adult diapers that are changed regularly will help prevent odors and skin damage.  Avoid caffeine. It can overstimulate your bladder.  Use the bathroom regularly. Try about every 2 3 hours to go to the bathroom, even if you do not feel the need to do so. Take time to empty your bladder completely. After urinating, wait a minute. Then try to urinate again.  For causes involving nerve dysfunction, keep a log of the medicines you take and a journal of the times you go to the bathroom. SEEK MEDICAL CARE IF:  You experience worsening of pain instead of improvement in pain after your procedure.  Your incontinence becomes worse instead of better. SEE IMMEDIATE MEDICAL CARE IF:  You experience fever or shaking chills.  You are unable to pass your urine.  You have redness spreading into your groin or down into your thighs. MAKE   SURE YOU:   Understand these instructions.   Will watch your condition.  Will get help right away if you are not doing well or get worse. Document Released: 05/27/2004 Document Revised: 02/07/2013 Document  Reviewed: 09/26/2012 [employer] Patient Information 2014 [employer].  

## 2013-05-21 NOTE — Telephone Encounter (Signed)
Rx was sent to pharmacy electronically. 

## 2013-06-05 ENCOUNTER — Ambulatory Visit (INDEPENDENT_AMBULATORY_CARE_PROVIDER_SITE_OTHER): Payer: Medicare Other | Admitting: Pharmacist Clinician (PhC)/ Clinical Pharmacy Specialist

## 2013-06-05 VITALS — BP 140/80 | HR 88

## 2013-06-05 DIAGNOSIS — I4891 Unspecified atrial fibrillation: Secondary | ICD-10-CM

## 2013-06-05 DIAGNOSIS — Z7901 Long term (current) use of anticoagulants: Secondary | ICD-10-CM

## 2013-06-05 LAB — POCT INR: INR: 3.9

## 2013-06-05 MED ORDER — WARFARIN SODIUM 3 MG PO TABS: ORAL_TABLET | ORAL | Status: DC

## 2013-06-18 ENCOUNTER — Ambulatory Visit (INDEPENDENT_AMBULATORY_CARE_PROVIDER_SITE_OTHER): Payer: Medicare Other | Admitting: Pharmacist Clinician (PhC)/ Clinical Pharmacy Specialist

## 2013-06-18 VITALS — BP 150/82 | HR 76

## 2013-06-18 DIAGNOSIS — I4891 Unspecified atrial fibrillation: Secondary | ICD-10-CM

## 2013-06-18 DIAGNOSIS — Z7901 Long term (current) use of anticoagulants: Secondary | ICD-10-CM

## 2013-06-18 LAB — POCT INR: INR: 2.3

## 2013-07-16 ENCOUNTER — Telehealth: Payer: Self-pay | Admitting: Cardiovascular Disease

## 2013-07-16 ENCOUNTER — Other Ambulatory Visit: Payer: Self-pay | Admitting: Pharmacist Clinician (PhC)/ Clinical Pharmacy Specialist

## 2013-07-16 ENCOUNTER — Ambulatory Visit (INDEPENDENT_AMBULATORY_CARE_PROVIDER_SITE_OTHER): Payer: Medicare Other | Admitting: Pharmacist Clinician (PhC)/ Clinical Pharmacy Specialist

## 2013-07-16 ENCOUNTER — Ambulatory Visit (INDEPENDENT_AMBULATORY_CARE_PROVIDER_SITE_OTHER): Payer: Medicare Other | Admitting: Cardiovascular Disease

## 2013-07-16 ENCOUNTER — Encounter: Payer: Self-pay | Admitting: Cardiovascular Disease

## 2013-07-16 VITALS — BP 130/70 | HR 82 | Resp 20 | Ht 64.0 in | Wt 155.0 lb

## 2013-07-16 DIAGNOSIS — I4891 Unspecified atrial fibrillation: Secondary | ICD-10-CM

## 2013-07-16 DIAGNOSIS — Z7901 Long term (current) use of anticoagulants: Secondary | ICD-10-CM

## 2013-07-16 DIAGNOSIS — I2510 Atherosclerotic heart disease of native coronary artery without angina pectoris: Secondary | ICD-10-CM

## 2013-07-16 LAB — MDC_IDC_ENUM_SESS_TYPE_INCLINIC
Battery Remaining Longevity: 108 mo
Date Time Interrogation Session: 20150316123509
Lead Channel Impedance Value: 666 Ohm
Lead Channel Impedance Value: 67 Ohm
Lead Channel Pacing Threshold Amplitude: 0.5 V
Lead Channel Pacing Threshold Pulse Width: 0.4 ms
Lead Channel Setting Pacing Pulse Width: 0.4 ms
MDC IDC MSMT BATTERY IMPEDANCE: 340 Ohm
MDC IDC MSMT BATTERY VOLTAGE: 2.79 V
MDC IDC MSMT LEADCHNL RV SENSING INTR AMPL: 15.67 mV
MDC IDC SET LEADCHNL RV PACING AMPLITUDE: 2.5 V
MDC IDC SET LEADCHNL RV SENSING SENSITIVITY: 4 mV
MDC IDC STAT BRADY RV PERCENT PACED: 89 %

## 2013-07-16 LAB — POCT INR: INR: 1.9

## 2013-07-16 LAB — PACEMAKER DEVICE OBSERVATION

## 2013-07-16 MED ORDER — NEBIVOLOL HCL 5 MG PO TABS: 5.0000 mg | ORAL_TABLET | Freq: Every day | ORAL | Status: DC

## 2013-07-16 NOTE — Telephone Encounter (Signed)
Dr. Loletha Grayer notified she is not taking Bystolic.  He said that is good - did not want her on Carvedilol and bystolic.  To continue Carvedilol 6.25mg  bid along with the Diltiazem.

## 2013-07-16 NOTE — Telephone Encounter (Signed)
Has a question to which medication Dr.Croiotru is going to eliminate please call.

## 2013-07-16 NOTE — Telephone Encounter (Signed)
Patient notified and voiced understanding to continue current meds.

## 2013-07-16 NOTE — Patient Instructions (Addendum)
Remote monitoring is used to monitor your pacemaker from home. This monitoring reduces the number of office visits required to check your device to one time per year. It allows [place] to keep an eye on the functioning of your device to ensure it is working properly. You are scheduled for a device check from home on 10-27-2013. You may send your transmission at any time that day. If you have a wireless device, the transmission will be sent automatically. After your physician reviews your transmission, you will receive a postcard with your next transmission date.    Your physician recommends that you schedule a follow-up appointment in: 6 months with Dr.[name].

## 2013-07-16 NOTE — Telephone Encounter (Signed)
Patient called stating she is NOT taking Bystolic.  States for her BP she is taking Diltiazem 360mg  qd and Carvedilol 12.5mg  1/2 tab bid.  What she she do.  States the Dilt is costing just under $300.

## 2013-07-16 NOTE — Telephone Encounter (Signed)
Sent to [place]

## 2013-07-22 ENCOUNTER — Encounter: Payer: Self-pay | Admitting: Cardiovascular Disease

## 2013-07-22 NOTE — Progress Notes (Signed)
[name], female   DOB: [birth year], 80 y.o.   MRN: [id number]      Reason for office visit Pacemaker followup, atrial fibrillation  Misty Blackwell has atrial fibrillation with slow ventricular response, is post dual-chamber permanent pacemaker implantation ([employer] 2011). She has severe but well controlled systemic hypertension and a history of mild pulmonary artery hypertension She has rare chest discomfort. She is under a lot of stress taking care of her husband. Does a lot of heavy lifting and pulling and has some dyspnea with activity. No edema. Occasional lightheadedness.    Allergies  Allergen Reactions  . Ace Inhibitors   . Acrylic Polymer [Carbomer]   . Amoxicillin-Pot Clavulanate   . Augmentin [Amoxicillin-Pot Clavulanate]   . Chocolate   . Ciprofloxacin   . Gabapentin   . Levaquin [Levofloxacin In D5w]   . Zocor [Simvastatin]     Current Outpatient Prescriptions  Medication Sig Dispense Refill  . ALPRAZolam (XANAX) 1 MG tablet Take 1 mg by mouth 2 (two) times daily as needed. anxiety      . aspirin EC 81 MG tablet Take 81 mg by mouth daily.      . carvedilol (COREG) 12.5 MG tablet Take 6.25 mg by mouth 2 (two) times daily with a meal.      . cholecalciferol (VITAMIN D) 1000 UNITS tablet Take 2,000 Units by mouth 2 (two) times daily.      Marland Kitchen diltiazem (TIAZAC) 360 MG 24 hr capsule Take 1 capsule (360 mg total) by mouth daily.  30 capsule  6  . fish oil-omega-3 fatty acids 1000 MG capsule Take 1 g by mouth daily.      . furosemide (LASIX) 40 MG tablet Take 20 mg by mouth daily as needed.       Marland Kitchen glucosamine-chondroitin 500-400 MG tablet Take 1 tablet by mouth daily.      . magnesium gluconate (MAGONATE) 500 MG tablet Take 500 mg by mouth daily.      . potassium chloride SA (K-DUR,KLOR-CON) 20 MEQ tablet take 1 tablet by mouth once daily  30 tablet  4  . pyridOXINE (VITAMIN B-6) 50 MG tablet Take 50 mg by mouth daily.      Marland Kitchen warfarin (COUMADIN) 3 MG tablet  Take 1 - 1& 1/2 tablets by mouth daily as directed  45 tablet  5   No current facility-administered medications for this visit.    Past Medical History  Diagnosis Date  . Coronary artery disease 03/19/2002    R/P Cardiolite - EF 76%; nromal static and dynamic myocardial perfusion images; normal wall motion and endocardial thickening in all vascular territories  . Hypertension   . Atrial fib/flutter, transient   . Cancer   . Pacemaker   . TIA (transient ischemic attack)   . CHF (congestive heart failure) 10/29/2009    Echo - EF >55%; normal LV size and systolic function; unable to assess diastolic fcn due to E/A fusion, pulmonary vein flow pattern suggests elevated filling pressure; marked biatrail dilation, mild/mod tricuspid regurgitation; mod pulmonary htn; mild/mod mitral regurgitation; although echocardiographic features are incomplete findings suggest possible infiltrative cardiomyopathy (maybe amyloidosi  . Facial numbness 12/26/2008    carotid doppler - R and L ICAs 0-49% diameter reduction (velocities suggest low end of scale)  . Atrial fibrillation, chronic   . Peripheral neuropathy     Past Surgical History  Procedure Laterality Date  . Cardioversion  11/19/2009    successful DCCV from AF to sinus type  rhythm  . Cardiac catheterization  08/06/2005    minimal coronary disease predominant RCA; no significant atherosclerosis; new onset sick sinus syndrome and atrial flutter w/ ventricular response, controlled on med therapy; systemic HTN, normal renal arteries    Family History  Problem Relation Age of Onset  . Heart disease Mother   . Diabetes Mother   . Heart attack Father   . Heart disease Father   . Cirrhosis Brother     History   Social History  . Marital Status: Married    Spouse Name: N/A    Number of Children: N/A  . Years of Education: N/A   Occupational History  . Not on file.   Social History Main Topics  . Smoking status: Never Smoker   . Smokeless  tobacco: Not on file  . Alcohol Use: No  . Drug Use: No  . Sexual Activity: Not on file   Other Topics Concern  . Not on file   Social History Narrative  . No narrative on file    Review of systems: The patient specifically denies any chest pain with exertion, dyspnea at rest, orthopnea, paroxysmal nocturnal dyspnea, syncope, palpitations, focal neurological deficits, intermittent claudication, lower extremity edema, unexplained weight gain, cough, hemoptysis or wheezing.  The patient also denies abdominal pain, nausea, vomiting, dysphagia, diarrhea, constipation, polyuria, polydipsia, dysuria, hematuria, frequency, urgency, abnormal bleeding or bruising, fever, chills, unexpected weight changes, mood swings, change in skin or hair texture, change in voice quality, auditory or visual problems, allergic reactions or rashes, new musculoskeletal complaints other than usual "aches and pains".   PHYSICAL EXAM BP 130/70  Pulse 82  Resp 20  Ht 5\' 4"  (1.626 m)  Wt 70.308 kg (155 lb)  BMI 26.59 kg/m2 General: Alert, oriented x3, no distress  Head: no evidence of trauma, PERRL, EOMI, no exophtalmos or lid lag, no myxedema, no xanthelasma; normal ears, nose and oropharynx  Neck: normal jugular venous pulsations and no hepatojugular reflux; brisk carotid pulses without delay and no carotid bruits  Chest: clear to auscultation, no signs of consolidation by percussion or palpation, normal fremitus, symmetrical and full respiratory excursions. Left subclavian pacemaker site appears healthy  Cardiovascular: normal position and quality of the apical impulse, regular rhythm with occasional irregularity, normal first and second heart sounds, no murmurs, rubs or gallops  Abdomen: no tenderness or distention, no masses by palpation, no abnormal pulsatility or arterial bruits, normal bowel sounds, no hepatosplenomegaly  Extremities: no clubbing, cyanosis or edema; 2+ radial, ulnar and brachial pulses  bilaterally; 2+ right femoral, posterior tibial and dorsalis pedis pulses; 2+ left femoral, posterior tibial and dorsalis pedis pulses; no subclavian or femoral bruits  Neurological: grossly nonfocal   EKG: Atrial fibrillation, ventricular pacing (89%)  Lipid Panel     Component Value Date/Time   CHOL 168 05/21/2013 1130   TRIG 141 05/21/2013 1130   HDL 39* 05/21/2013 1130   CHOLHDL 4.3 05/21/2013 1130   VLDL 28 05/21/2013 1130   LDLCALC 101* 05/21/2013 1130    BMET    Component Value Date/Time   NA 138 05/21/2013 1130   K 4.3 05/21/2013 1130   CL 103 05/21/2013 1130   CO2 29 05/21/2013 1130   GLUCOSE 78 05/21/2013 1130   BUN 19 05/21/2013 1130   CREATININE 0.71 05/21/2013 1130   CREATININE 0.73 06/04/2011 1456   CALCIUM 9.2 05/21/2013 1130   GFRNONAA 78* 06/04/2011 1456   GFRAA >90 06/04/2011 1456     ASSESSMENT AND PLAN  Normal pacemaker function. She has permanent atrial fibrillation. The ventricle is paced 90% of the time. Occasional high ventricular rates mostly appears to be atrial fibrillation although there is some change in the intracardiac electrogram at times (? Aberrancy?). No changes are made to her medications or to device settings. Warfarin anticoagulation in therapeutic range.  Patient Instructions  Remote monitoring is used to monitor your pacemaker from home. This monitoring reduces the number of office visits required to check your device to one time per year. It allows [place] to keep an eye on the functioning of your device to ensure it is working properly. You are scheduled for a device check from home on 10-27-2013. You may send your transmission at any time that day. If you have a wireless device, the transmission will be sent automatically. After your physician reviews your transmission, you will receive a postcard with your next transmission date.    Your physician recommends that you schedule a follow-up appointment in: 6 months with Dr.Mattox Schorr.        Orders  Placed This Encounter  Procedures  . Implantable device check  . EKG 12-Lead   Meds ordered this encounter  Medications  . DISCONTD: nebivolol (BYSTOLIC) 5 MG tablet    Sig: Take 1 tablet (5 mg total) by mouth daily.    Dispense:  30 tablet    Refill:  0    07/16/13 decrease to 1/2 tablet daily x 4 days then stop.  . carvedilol (COREG) 12.5 MG tablet    Sig: Take 6.25 mg by mouth 2 (two) times daily with a meal.    Kaityln Kallstrom  Sanda Klein, MD, [facility] [phone] office [phone] pager

## 2013-08-01 ENCOUNTER — Other Ambulatory Visit: Payer: Self-pay

## 2013-08-01 MED ORDER — FUROSEMIDE 40 MG PO TABS: 20.0000 mg | ORAL_TABLET | Freq: Every day | ORAL | Status: DC | PRN

## 2013-08-01 NOTE — Telephone Encounter (Signed)
Rx was sent to pharmacy electronically. 

## 2013-08-13 ENCOUNTER — Ambulatory Visit (INDEPENDENT_AMBULATORY_CARE_PROVIDER_SITE_OTHER): Payer: Medicare Other | Admitting: Pharmacist Clinician (PhC)/ Clinical Pharmacy Specialist

## 2013-08-13 DIAGNOSIS — Z7901 Long term (current) use of anticoagulants: Secondary | ICD-10-CM

## 2013-08-13 DIAGNOSIS — I4891 Unspecified atrial fibrillation: Secondary | ICD-10-CM

## 2013-08-13 LAB — POCT INR: INR: 2.3

## 2013-08-15 ENCOUNTER — Encounter: Payer: Self-pay | Admitting: Internal Medicine

## 2013-08-15 ENCOUNTER — Ambulatory Visit (INDEPENDENT_AMBULATORY_CARE_PROVIDER_SITE_OTHER): Payer: Medicare Other | Admitting: Internal Medicine

## 2013-08-15 VITALS — BP 138/90 | HR 88 | Temp 97.5°F | Resp 16 | Ht 64.0 in | Wt 149.2 lb

## 2013-08-15 DIAGNOSIS — Z0000 Encounter for general adult medical examination without abnormal findings: Secondary | ICD-10-CM

## 2013-08-15 DIAGNOSIS — Z789 Other specified health status: Secondary | ICD-10-CM

## 2013-08-15 DIAGNOSIS — E782 Mixed hyperlipidemia: Secondary | ICD-10-CM

## 2013-08-15 DIAGNOSIS — Z1331 Encounter for screening for depression: Secondary | ICD-10-CM

## 2013-08-15 DIAGNOSIS — R7303 Prediabetes: Secondary | ICD-10-CM

## 2013-08-15 DIAGNOSIS — E559 Vitamin D deficiency, unspecified: Secondary | ICD-10-CM | POA: Insufficient documentation

## 2013-08-15 DIAGNOSIS — I10 Essential (primary) hypertension: Secondary | ICD-10-CM

## 2013-08-15 DIAGNOSIS — Z79899 Other long term (current) drug therapy: Secondary | ICD-10-CM

## 2013-08-15 DIAGNOSIS — Z1211 Encounter for screening for malignant neoplasm of colon: Secondary | ICD-10-CM | POA: Insufficient documentation

## 2013-08-15 DIAGNOSIS — Z1212 Encounter for screening for malignant neoplasm of rectum: Secondary | ICD-10-CM

## 2013-08-15 LAB — LIPID PANEL
CHOLESTEROL: 163 mg/dL (ref 0–200)
HDL: 40 mg/dL (ref >39)
LDL Cholesterol: 85 mg/dL (ref 0–99)
TRIGLYCERIDES: 192 mg/dL — AB (ref <150)
Total CHOL/HDL Ratio: 4.1 Ratio
VLDL: 38 mg/dL (ref 0–40)

## 2013-08-15 LAB — CBC WITH DIFFERENTIAL/PLATELET
BASOS PCT: 1 % (ref 0–1)
Basophils Absolute: 0.1 10*3/uL (ref 0.0–0.1)
EOS ABS: 0.1 10*3/uL (ref 0.0–0.7)
Eosinophils Relative: 1 % (ref 0–5)
HCT: 42.8 % (ref 36.0–46.0)
Hemoglobin: 14.3 g/dL (ref 12.0–15.0)
Lymphocytes Relative: 32 % (ref 12–46)
Lymphs Abs: 2.7 10*3/uL (ref 0.7–4.0)
MCH: 29.9 pg (ref 26.0–34.0)
MCHC: 33.4 g/dL (ref 30.0–36.0)
MCV: 89.4 fL (ref 78.0–100.0)
MONOS PCT: 12 % (ref 3–12)
Monocytes Absolute: 1 10*3/uL (ref 0.1–1.0)
NEUTROS PCT: 54 % (ref 43–77)
Neutro Abs: 4.5 10*3/uL (ref 1.7–7.7)
PLATELETS: 251 10*3/uL (ref 150–400)
RBC: 4.79 MIL/uL (ref 3.87–5.11)
RDW: 13.5 % (ref 11.5–15.5)
WBC: 8.3 10*3/uL (ref 4.0–10.5)

## 2013-08-15 LAB — BASIC METABOLIC PANEL WITH GFR
BUN: 22 mg/dL (ref 6–23)
CALCIUM: 9.6 mg/dL (ref 8.4–10.5)
CO2: 30 mEq/L (ref 19–32)
Chloride: 104 mEq/L (ref 96–112)
Creat: 0.77 mg/dL (ref 0.50–1.10)
GFR, EST NON AFRICAN AMERICAN: 72 mL/min
GFR, Est African American: 83 mL/min
Glucose, Bld: 86 mg/dL (ref 70–99)
Potassium: 4.3 mEq/L (ref 3.5–5.3)
Sodium: 141 mEq/L (ref 135–145)

## 2013-08-15 LAB — HEPATIC FUNCTION PANEL
ALT: 15 U/L (ref 0–35)
AST: 18 U/L (ref 0–37)
Albumin: 4.2 g/dL (ref 3.5–5.2)
Alkaline Phosphatase: 147 U/L — ABNORMAL HIGH (ref 39–117)
BILIRUBIN TOTAL: 0.6 mg/dL (ref 0.2–1.2)
Bilirubin, Direct: 0.1 mg/dL (ref 0.0–0.3)
Indirect Bilirubin: 0.5 mg/dL (ref 0.2–1.2)
Total Protein: 6.9 g/dL (ref 6.0–8.3)

## 2013-08-15 LAB — HEMOGLOBIN A1C
Hgb A1c MFr Bld: 5.8 % — ABNORMAL HIGH (ref <5.7)
Mean Plasma Glucose: 120 mg/dL — ABNORMAL HIGH (ref <117)

## 2013-08-15 LAB — MAGNESIUM: MAGNESIUM: 1.9 mg/dL (ref 1.5–2.5)

## 2013-08-15 LAB — TSH: TSH: 0.746 u[IU]/mL (ref 0.350–4.500)

## 2013-08-15 NOTE — Progress Notes (Signed)
[name], female   DOB: 1927/07/23, 80 y.o.   MRN: [id number]   Annual Screening Comprehensive Examination  This very nice 80 y.o. [name] presents for complete physical.  Patient has been followed for HTN, Prediabetes, ASCAD/Afib/Pacemaker Hyperlipidemia, and Vitamin D Deficiency.    HTN predates since  52. Patient's BP has been controlled at home. Today's BP: 138/90 mmHg. Patient had a normal Heart Cath in  1993 and 2007 and in 2002 she was found in Afib. Then in 2003, she had a negative cardiolyte . In 2006 she had a TIA which recovered . In 2007 she had a cardiac pacemaker inserted for SSS. Patient is followed by Dr Sallyanne Kuster for her ASHD, Afib and cardiac pacemaker. Patient denies any cardiac symptoms as chest pain, palpitations, shortness of breath, dizziness or ankle swelling.   Patient's hyperlipidemia is controlled with diet and medications. Patient denies myalgias or other medication SE's. Last cholesterol last visit was 168, triglycerides 141, HDL 39 and LDL 101 in Jan 2015 - at goal.     Patient has prediabetes with A1c 5.9% and elevated insulin  Of 86 in2011 and last A1c was  5.8% in Jan 2015. Patient denies reactive hypoglycemic symptoms, visual blurring, diabetic polys, or paresthesias.    Finally, patient has history of Vitamin D Deficiency of 13 in 2008 with last vitamin D 51 in Oct 2014   Medication Sig  . ALPRAZolam (XANAX) 1 MG tablet Take 1 mg by mouth 2 (two) times daily as needed. anxiety  . aspirin EC 81 MG tablet Take 81 mg by mouth daily.  . carvedilol (COREG) 12.5 MG tablet Take 6.25 mg by mouth 2 (two) times daily with a meal.  . cholecalciferol (VITAMIN D) 1000 UNITS tablet Take 2,000 Units by mouth 2 (two) times daily.  Marland Kitchen diltiazem (TIAZAC) 360 MG 24 hr capsule Take 1 capsule (360 mg total) by mouth daily.  . fish oil-omega-3 fatty acids 1000 MG capsule Take 1 g by mouth daily.  . furosemide (LASIX) 40 MG tablet Take 0.5 tablets (20 mg total) by mouth  daily as needed.  Marland Kitchen glucosamine-chondroitin 500-400 MG tablet Take 1 tablet by mouth daily.  . magnesium gluconate (MAGONATE) 500 MG tablet Take 500 mg by mouth daily.  . potassium chloride SA (K-DUR,KLOR-CON) 20 MEQ tablet take 1 tablet by mouth once daily  . pyridOXINE (VITAMIN B-6) 50 MG tablet Take 50 mg by mouth daily.  Marland Kitchen warfarin (COUMADIN) 3 MG tablet Take 1 - 1& 1/2 tablets by mouth daily as directed   Allergies  Allergen Reactions  . Ace Inhibitors   . Acrylic Polymer [Carbomer]   . Amoxicillin-Pot Clavulanate   . Augmentin [Amoxicillin-Pot Clavulanate]   . Chocolate   . Ciprofloxacin   . Gabapentin   . Levaquin [Levofloxacin In D5w]   . Zocor [Simvastatin]    Past Medical History  Diagnosis Date  . Coronary artery disease 03/19/2002    R/P Cardiolite - EF 76%; nromal static and dynamic myocardial perfusion images; normal wall motion and endocardial thickening in all vascular territories  . Hypertension   . Atrial fib/flutter, transient   . Cancer   . Pacemaker   . TIA (transient ischemic attack)   . CHF (congestive heart failure) 10/29/2009    Echo - EF >55%; normal LV size and systolic function; unable to assess diastolic fcn due to E/A fusion, pulmonary vein flow pattern suggests elevated filling pressure; marked biatrail dilation, mild/mod tricuspid regurgitation; mod pulmonary htn; mild/mod mitral  regurgitation; although echocardiographic features are incomplete findings suggest possible infiltrative cardiomyopathy (maybe amyloidosi  . Facial numbness 12/26/2008    carotid doppler - R and L ICAs 0-49% diameter reduction (velocities suggest low end of scale)  . Atrial fibrillation, chronic   . Peripheral neuropathy    Past Surgical History  Procedure Laterality Date  . Cardioversion  11/19/2009    successful DCCV from AF to sinus type rhythm  . Cardiac catheterization  08/06/2005    minimal coronary disease predominant RCA; no significant atherosclerosis; new onset  sick sinus syndrome and atrial flutter w/ ventricular response, controlled on med therapy; systemic HTN, normal renal arteries   Family History  Problem Relation Age of Onset  . Heart disease Mother   . Diabetes Mother   . Heart attack Father   . Heart disease Father   . Cirrhosis Brother    History  Substance Use Topics  . Smoking status: Never Smoker   . Smokeless tobacco: Not on file  . Alcohol Use: No    ROS Constitutional: Denies fever, chills, weight loss/gain, headaches, insomnia, fatigue, night sweats, and change in appetite. Eyes: Denies redness, blurred vision, diplopia, discharge, itchy, watery eyes.  ENT: Denies discharge, congestion, post nasal drip, epistaxis, sore throat, earache, hearing loss, dental pain, Tinnitus, Vertigo, Sinus pain, snoring.  Cardio: Denies chest pain, palpitations, irregular heartbeat, syncope, dyspnea, diaphoresis, orthopnea, PND, claudication, edema Respiratory: denies cough, dyspnea, DOE, pleurisy, hoarseness, laryngitis, wheezing.  Gastrointestinal: Denies dysphagia, heartburn, reflux, water brash, pain, cramps, nausea, vomiting, bloating, diarrhea, constipation, hematemesis, melena, hematochezia, jaundice, hemorrhoids Genitourinary: Denies dysuria, frequency, urgency, nocturia, hesitancy, discharge, hematuria, flank pain Breast:Breast lumps, nipple discharge, bleeding.  Musculoskeletal: Denies arthralgia, myalgia, stiffness, Jt. Swelling, pain, limp, and strain/sprain. Skin: Denies puritis, rash, hives, warts, acne, eczema, changing in skin lesion Neuro: No weakness, tremor, incoordination, spasms, paresthesia, pain Psychiatric: Denies confusion, memory loss, sensory loss Endocrine: Denies change in weight, skin, hair change, nocturia, and paresthesia, diabetic polys, visual blurring, hyper / hypo glycemic episodes.  Heme/Lymph: No excessive bleeding, bruising, enlarged lymph nodes.  Physical Exam    BP 138/90  Pulse 88  Temp 97.5 F    Resp 16  Ht 5\' 4"   Wt 149 lb 3.2 oz   BMI 25.60 kg/m2  General Appearance: Well nourished, in no apparent distress. Eyes: PERRLA, EOMs, conjunctiva no swelling or erythema, normal fundi and vessels. Sinuses: No frontal/maxillary tenderness ENT/Mouth: EACs patent / TMs  nl. Nares clear without erythema, swelling, mucoid exudates. Oral hygiene is good. No erythema, swelling, or exudate. Tongue normal, non-obstructing. Tonsils not swollen or erythematous. Hearing normal.  Neck: Supple, thyroid normal. No bruits, nodes or JVD. Respiratory: Respiratory effort normal.  BS equal and clear bilateral without rales, rhonci, wheezing or stridor. Cardio: Heart sounds are normal with regular rate and rhythm and no murmurs, rubs or gallops. Peripheral pulses are normal and equal bilaterally without edema. No aortic or femoral bruits. Chest: symmetric with normal excursions and percussion. Breasts: Symmetric, without lumps, nipple discharge, retractions, or fibrocystic changes.  Abdomen: Flat, soft, with bowl sounds. Nontender, no guarding, rebound, hernias, masses, or organomegaly.  Lymphatics: Non tender without lymphadenopathy.  Genitourinary:  Musculoskeletal: Full ROM all peripheral extremities, joint stability, 5/5 strength, and normal gait. Skin: Warm and dry without rashes, lesions, cyanosis, clubbing or  ecchymosis.  Neuro: Cranial nerves intact, reflexes equal bilaterally. Normal muscle tone, no cerebellar symptoms. Sensation intact.  Pysch: Awake and oriented X 3, normal affect, Insight and Judgment appropriate.   Assessment  and Plan  1. Annual Screening Examination 2. Hypertension  3. Hyperlipidemia 4. Pre Diabetes 5. Vitamin D Deficiency 6. ASCAD/Afib/Pacemaker  Continue prudent diet as discussed, weight control, BP monitoring, regular exercise, and medications. Discussed med's effects and SE's. Screening labs and tests as requested with regular follow-up as recommended.

## 2013-08-15 NOTE — Patient Instructions (Signed)

## 2013-08-16 LAB — MICROALBUMIN / CREATININE URINE RATIO
CREATININE, URINE: 185.9 mg/dL
MICROALB UR: 2.33 mg/dL — AB (ref 0.00–1.89)
Microalb Creat Ratio: 12.5 mg/g (ref 0.0–30.0)

## 2013-08-16 LAB — URINALYSIS, MICROSCOPIC ONLY: Casts: NONE SEEN

## 2013-08-16 LAB — VITAMIN D 25 HYDROXY (VIT D DEFICIENCY, FRACTURES): Vit D, 25-Hydroxy: 55 ng/mL (ref 30–89)

## 2013-08-16 LAB — INSULIN, FASTING: INSULIN FASTING, SERUM: 19 u[IU]/mL (ref 3–28)

## 2013-08-20 ENCOUNTER — Other Ambulatory Visit: Payer: Self-pay | Admitting: Internal Medicine

## 2013-08-24 ENCOUNTER — Inpatient Hospital Stay (HOSPITAL_COMMUNITY)
Admission: EM | Admit: 2013-08-24 | Discharge: 2013-08-27 | DRG: 871 | Disposition: A | Discharge status: Home / Self Care | Payer: Medicare Other | Attending: Internal Medicine | Admitting: Internal Medicine

## 2013-08-24 ENCOUNTER — Emergency Department (HOSPITAL_COMMUNITY): Payer: Medicare Other

## 2013-08-24 ENCOUNTER — Encounter (HOSPITAL_COMMUNITY): Payer: Self-pay | Admitting: Emergency Medicine

## 2013-08-24 DIAGNOSIS — I2510 Atherosclerotic heart disease of native coronary artery without angina pectoris: Secondary | ICD-10-CM | POA: Diagnosis present

## 2013-08-24 DIAGNOSIS — I10 Essential (primary) hypertension: Secondary | ICD-10-CM | POA: Diagnosis present

## 2013-08-24 DIAGNOSIS — E876 Hypokalemia: Secondary | ICD-10-CM | POA: Diagnosis present

## 2013-08-24 DIAGNOSIS — I4892 Unspecified atrial flutter: Secondary | ICD-10-CM | POA: Diagnosis present

## 2013-08-24 DIAGNOSIS — G609 Hereditary and idiopathic neuropathy, unspecified: Secondary | ICD-10-CM | POA: Diagnosis present

## 2013-08-24 DIAGNOSIS — Z7901 Long term (current) use of anticoagulants: Secondary | ICD-10-CM

## 2013-08-24 DIAGNOSIS — A498 Other bacterial infections of unspecified site: Secondary | ICD-10-CM | POA: Diagnosis present

## 2013-08-24 DIAGNOSIS — A419 Sepsis, unspecified organism: Principal | ICD-10-CM | POA: Diagnosis present

## 2013-08-24 DIAGNOSIS — I509 Heart failure, unspecified: Secondary | ICD-10-CM | POA: Diagnosis present

## 2013-08-24 DIAGNOSIS — I4891 Unspecified atrial fibrillation: Secondary | ICD-10-CM | POA: Diagnosis present

## 2013-08-24 DIAGNOSIS — Z950 Presence of cardiac pacemaker: Secondary | ICD-10-CM | POA: Diagnosis present

## 2013-08-24 DIAGNOSIS — Z8673 Personal history of transient ischemic attack (TIA), and cerebral infarction without residual deficits: Secondary | ICD-10-CM

## 2013-08-24 DIAGNOSIS — J189 Pneumonia, unspecified organism: Secondary | ICD-10-CM | POA: Diagnosis present

## 2013-08-24 DIAGNOSIS — N390 Urinary tract infection, site not specified: Secondary | ICD-10-CM | POA: Diagnosis present

## 2013-08-24 HISTORY — DX: Unspecified malignant neoplasm of skin, unspecified: C44.90

## 2013-08-24 LAB — URINALYSIS, ROUTINE W REFLEX MICROSCOPIC
Bilirubin Urine: NEGATIVE
Glucose, UA: NEGATIVE mg/dL
Ketones, ur: NEGATIVE mg/dL
Nitrite: POSITIVE — AB
Protein, ur: NEGATIVE mg/dL
Specific Gravity, Urine: 1.014 (ref 1.005–1.030)
Urobilinogen, UA: 1 mg/dL (ref 0.0–1.0)
pH: 5.5 (ref 5.0–8.0)

## 2013-08-24 LAB — COMPREHENSIVE METABOLIC PANEL
ALT: 13 U/L (ref 0–35)
AST: 17 U/L (ref 0–37)
Albumin: 3.9 g/dL (ref 3.5–5.2)
Alkaline Phosphatase: 177 U/L — ABNORMAL HIGH (ref 39–117)
BUN: 14 mg/dL (ref 6–23)
CO2: 28 mEq/L (ref 19–32)
Calcium: 9.4 mg/dL (ref 8.4–10.5)
Chloride: 96 mEq/L (ref 96–112)
Creatinine, Ser: 0.73 mg/dL (ref 0.50–1.10)
GFR calc Af Amer: 88 mL/min — ABNORMAL LOW (ref >90)
GFR calc non Af Amer: 76 mL/min — ABNORMAL LOW (ref >90)
Glucose, Bld: 139 mg/dL — ABNORMAL HIGH (ref 70–99)
Potassium: 3.5 mEq/L — ABNORMAL LOW (ref 3.7–5.3)
Sodium: 137 mEq/L (ref 137–147)
Total Bilirubin: 1.6 mg/dL — ABNORMAL HIGH (ref 0.3–1.2)
Total Protein: 7.8 g/dL (ref 6.0–8.3)

## 2013-08-24 LAB — I-STAT CG4 LACTIC ACID, ED: Lactic Acid, Venous: 1.66 mmol/L (ref 0.5–2.2)

## 2013-08-24 LAB — CBC WITH DIFFERENTIAL/PLATELET
Basophils Absolute: 0 10*3/uL (ref 0.0–0.1)
Basophils Relative: 0 % (ref 0–1)
Eosinophils Absolute: 0 10*3/uL (ref 0.0–0.7)
Eosinophils Relative: 0 % (ref 0–5)
HCT: 42.3 % (ref 36.0–46.0)
Hemoglobin: 13.8 g/dL (ref 12.0–15.0)
Lymphocytes Relative: 12 % (ref 12–46)
Lymphs Abs: 2 10*3/uL (ref 0.7–4.0)
MCH: 29.8 pg (ref 26.0–34.0)
MCHC: 32.6 g/dL (ref 30.0–36.0)
MCV: 91.4 fL (ref 78.0–100.0)
Monocytes Absolute: 1.7 10*3/uL — ABNORMAL HIGH (ref 0.1–1.0)
Monocytes Relative: 10 % (ref 3–12)
Neutro Abs: 12.9 10*3/uL — ABNORMAL HIGH (ref 1.7–7.7)
Neutrophils Relative %: 78 % — ABNORMAL HIGH (ref 43–77)
Platelets: 201 10*3/uL (ref 150–400)
RBC: 4.63 MIL/uL (ref 3.87–5.11)
RDW: 13.1 % (ref 11.5–15.5)
WBC: 16.6 10*3/uL — ABNORMAL HIGH (ref 4.0–10.5)

## 2013-08-24 LAB — PROTIME-INR
INR: 1.5 — ABNORMAL HIGH (ref 0.00–1.49)
Prothrombin Time: 17.7 seconds — ABNORMAL HIGH (ref 11.6–15.2)

## 2013-08-24 LAB — URINE MICROSCOPIC-ADD ON

## 2013-08-24 MED ORDER — WARFARIN - PHARMACIST DOSING INPATIENT: Freq: Every day | Status: DC

## 2013-08-24 MED ORDER — CEFTRIAXONE SODIUM 1 G IJ SOLR
1.0000 g | Freq: Once | INTRAMUSCULAR | Status: AC
  Administered 2013-08-24: 1 g via INTRAVENOUS
  Filled 2013-08-24: qty 10

## 2013-08-24 MED ORDER — DILTIAZEM HCL ER COATED BEADS 360 MG PO TB24
360.0000 mg | ORAL_TABLET | Freq: Every day | ORAL | Status: DC
  Administered 2013-08-25 – 2013-08-27 (×3): 360 mg via ORAL
  Filled 2013-08-24 (×3): qty 1

## 2013-08-24 MED ORDER — DEXTROSE 5 % IV SOLN
1.0000 g | INTRAVENOUS | Status: DC
  Administered 2013-08-25 – 2013-08-26 (×2): 1 g via INTRAVENOUS
  Filled 2013-08-24 (×3): qty 10

## 2013-08-24 MED ORDER — ACETAMINOPHEN 325 MG PO TABS: 650.0000 mg | ORAL_TABLET | ORAL | Status: DC | PRN

## 2013-08-24 MED ORDER — WARFARIN SODIUM 5 MG PO TABS
5.0000 mg | ORAL_TABLET | Freq: Once | ORAL | Status: AC
  Administered 2013-08-24: 5 mg via ORAL
  Filled 2013-08-24: qty 1

## 2013-08-24 MED ORDER — OSELTAMIVIR PHOSPHATE 75 MG PO CAPS
75.0000 mg | ORAL_CAPSULE | Freq: Two times a day (BID) | ORAL | Status: DC
  Administered 2013-08-24: 75 mg via ORAL
  Filled 2013-08-24 (×4): qty 1

## 2013-08-24 MED ORDER — CARVEDILOL 12.5 MG PO TABS
12.5000 mg | ORAL_TABLET | Freq: Every day | ORAL | Status: DC
  Administered 2013-08-25 – 2013-08-27 (×3): 12.5 mg via ORAL
  Filled 2013-08-24 (×3): qty 1

## 2013-08-24 MED ORDER — CEFTRIAXONE SODIUM 1 G IJ SOLR: 1.0000 g | INTRAMUSCULAR | Status: DC

## 2013-08-24 MED ORDER — GABAPENTIN 300 MG PO CAPS
300.0000 mg | ORAL_CAPSULE | Freq: Two times a day (BID) | ORAL | Status: DC
Start: 2013-08-24 — End: 2013-08-27
  Administered 2013-08-24 – 2013-08-27 (×6): 300 mg via ORAL
  Filled 2013-08-24 (×7): qty 1

## 2013-08-24 MED ORDER — MAGNESIUM OXIDE 400 (241.3 MG) MG PO TABS
400.0000 mg | ORAL_TABLET | Freq: Every day | ORAL | Status: DC
  Administered 2013-08-24 – 2013-08-27 (×4): 400 mg via ORAL
  Filled 2013-08-24 (×4): qty 1

## 2013-08-24 MED ORDER — ASPIRIN EC 81 MG PO TBEC
81.0000 mg | DELAYED_RELEASE_TABLET | Freq: Every day | ORAL | Status: DC
  Administered 2013-08-25 – 2013-08-27 (×3): 81 mg via ORAL
  Filled 2013-08-24 (×3): qty 1

## 2013-08-24 MED ORDER — VITAMIN D3 25 MCG (1000 UNIT) PO TABS
1000.0000 [IU] | ORAL_TABLET | Freq: Three times a day (TID) | ORAL | Status: DC
  Administered 2013-08-24 – 2013-08-27 (×8): 1000 [IU] via ORAL
  Filled 2013-08-24 (×10): qty 1

## 2013-08-24 MED ORDER — VITAMIN B-6 50 MG PO TABS
50.0000 mg | ORAL_TABLET | Freq: Every day | ORAL | Status: DC
  Administered 2013-08-24 – 2013-08-27 (×4): 50 mg via ORAL
  Filled 2013-08-24 (×4): qty 1

## 2013-08-24 MED ORDER — POTASSIUM CHLORIDE CRYS ER 20 MEQ PO TBCR
20.0000 meq | EXTENDED_RELEASE_TABLET | Freq: Every day | ORAL | Status: DC
  Administered 2013-08-25 – 2013-08-27 (×3): 20 meq via ORAL
  Filled 2013-08-24 (×3): qty 1

## 2013-08-24 MED ORDER — DEXTROSE 5 % IV SOLN: 500.0000 mg | INTRAVENOUS | Status: DC

## 2013-08-24 MED ORDER — DEXTROSE 5 % IV SOLN
500.0000 mg | Freq: Once | INTRAVENOUS | Status: AC
  Administered 2013-08-24: 500 mg via INTRAVENOUS

## 2013-08-24 MED ORDER — DEXTROSE 5 % IV SOLN
500.0000 mg | INTRAVENOUS | Status: DC
  Administered 2013-08-25 – 2013-08-26 (×2): 500 mg via INTRAVENOUS
  Filled 2013-08-24 (×3): qty 500

## 2013-08-24 MED ORDER — POTASSIUM CHLORIDE IN NACL 20-0.9 MEQ/L-% IV SOLN
INTRAVENOUS | Status: DC
  Administered 2013-08-24 – 2013-08-25 (×2): via INTRAVENOUS
  Administered 2013-08-25: 1000 mL via INTRAVENOUS
  Filled 2013-08-24 (×4): qty 1000

## 2013-08-24 NOTE — Progress Notes (Signed)
ANTICOAGULATION CONSULT NOTE - Initial Consult  Pharmacy Consult for Warfarin Indication: atrial fibrillation  Allergies  Allergen Reactions  . Ace Inhibitors     Unknown   . Acrylic Polymer [Carbomer]   . Augmentin [Amoxicillin-Pot Clavulanate]   . Chocolate Other (See Comments)    migraine's   . Ciprofloxacin   . Gabapentin Other (See Comments)    Makes her sleepy   . Levaquin [Levofloxacin In D5w]   . Zocor [Simvastatin]     Patient Measurements:   Heparin Dosing Weight:   Vital Signs: Temp: 98.3 F (36.8 C) (04/24 1614) Temp src: Oral (04/24 1614) BP: 116/51 mmHg (04/24 1614) Pulse Rate: 63 (04/24 1614)  Labs:  Recent Labs  08/24/13 1430 08/24/13 1431  HGB  --  13.8  HCT  --  42.3  PLT  --  201  LABPROT 17.7*  --   INR 1.50*  --   CREATININE  --  0.73    The CrCl is unknown because both a height and weight (above a minimum accepted value) are required for this calculation.   Medical History: Past Medical History  Diagnosis Date  . Coronary artery disease 03/19/2002    R/P Cardiolite - EF 76%; nromal static and dynamic myocardial perfusion images; normal wall motion and endocardial thickening in all vascular territories  . Hypertension   . Atrial fib/flutter, transient   . Skin cancer     s/p surgical removal.  . Pacemaker   . TIA (transient ischemic attack)   . CHF (congestive heart failure) 10/29/2009    Echo - EF >55%; normal LV size and systolic function; unable to assess diastolic fcn due to E/A fusion, pulmonary vein flow pattern suggests elevated filling pressure; marked biatrail dilation, mild/mod tricuspid regurgitation; mod pulmonary htn; mild/mod mitral regurgitation; although echocardiographic features are incomplete findings suggest possible infiltrative cardiomyopathy (maybe amyloidosi  . Facial numbness 12/26/2008    carotid doppler - R and L ICAs 0-49% diameter reduction (velocities suggest low end of scale)  . Atrial fibrillation,  chronic   . Peripheral neuropathy    Assessment: 61 yoF with PMHx CAD, HTN, CHF (EF 55%), TIA, and afib with pacemaker on chronic warfarin presents 4/24 with fever, body aches, cough, and weakness for last 4 days.  CXR with questionable infiltrates and pt started on abx for CAP.  Pharmacy consulted to resume warfarin dosing.  INR on admission is subtherapeutic.   Home dose warfarin: 3mg  daily except 4.5mg  M/F, last dose 08/23/13.   4/24:   CBC: Hgb and plts WNL, no issues noted  INR 1.50, subtherapeutic  Cardiac diet ordered  Bleeding risk increased with azithromycin, will monitor closely, also on ASA 81mg  daily.  Will give slightly boosted dose tonight    Goal of Therapy:  INR 2-3 Monitor platelets by anticoagulation protocol: Yes   Plan:  Warfarin 5mg  po x 1 tonight Daily PT/INR  Ralene Bathe, PharmD, BCPS 08/24/2013, 4:43 PM  Pager: [phone]

## 2013-08-24 NOTE — Progress Notes (Signed)
Utilization Review completed.  Tenee Wish RN CM  

## 2013-08-24 NOTE — ED Notes (Signed)
MD and Pharmacy at bedside.

## 2013-08-24 NOTE — Progress Notes (Signed)
  CARE MANAGEMENT ED NOTE 08/24/2013  Patient:  Misty Blackwell, Misty Blackwell   Account Number:  [id number]  Date Initiated:  08/24/2013  Documentation initiated by:  Livia Snellen  Subjective/Objective Assessment:   Patient presents to Ed with fever and cough.     Subjective/Objective Assessment Detail:   EMS reports fever of 103.  Patient with pmhx of HTN, CHF, TIA, A fib, CAD     Action/Plan:   Action/Plan Detail:   Anticipated DC Date:       Status Recommendation to Physician:   Result of Recommendation:    Other ED Services  Consult Working [place]  Other    Choice offered to / List presented to:            Status of service:  Completed, signed off  ED Comments:   ED Comments Detail:  EDCM spoke to patient's niece Misty Blackwell at bedside. Diana's phone number [phone].  Patient currently asleep at this time.  As per patient's niece, patient lives at home alone and is extremely independent.  Patient still drives as per patient's niece.  Patient does not have any home health services at this time and has never had home health services inthe past.  Patient does not have any medical equipment at home.  Patient is able to complete her ADL's on her own.  Patient's niece lives approximately fifteen minuets away from patient.  Patient's niece also reports patient has a friend who checks in on her named Harriot.  [place] providedpatient's niece with a list of home health agencies in [facility] and a list of private duty nursing services.  EDCM explained with home health the patient  may recieve a visiting RN,PT, OT aide and social worker if needed.  EDCM also explained that private duty nursing services may be an out of pocket expense for patient.  Patient's niece thankful for resources.  No further EDCM needs at this time.

## 2013-08-24 NOTE — ED Notes (Signed)
Per EMS patient complains of feeling ill.  Pt has had fever and cough.  Pt had fever 103 upon EMS arrival given 1000mg  tylenol in route.

## 2013-08-24 NOTE — H&P (Signed)
History and Physical:    Misty Blackwell JKD:[id number] DOB: 14-Sep-1927 DOA: 08/24/2013  Referring physician: Dr. Joseph Berkshire PCP: Alesia Richards, MD   Chief Complaint: Cough, fever, chills, body aches  History of Present Illness:   Misty Blackwell is an 80 y.o. female with a PMH of CAD, HTN and atrial fibrillation on chronic Coumadin who presents with a 3 day history of upper respiratory symptoms including fever/chills, cough, body aches, pharyngitis, weakness/lethargy.  No sick contacts.  Took Tavist D this morning about 10:00 am with no relief of symptoms.  Her niece came to see her, and noticed that she was unsteady on her feet, then around noon she was confused and was unable to get up from her recliner, so her niece called EMS.  Upon initial evaluation in the ED, the patient was noted to have leukocytosis with a WBC of 16.6, a rectal temperature of 103.1, and infiltrates on chest radiography.  ROS:   Constitutional: + fever, + chills;  Appetite diminished; No weight loss, no weight gain, + fatigue.  HEENT: No blurry vision, no diplopia, + pharyngitis, no dysphagia CV: No chest pain, no palpitations, no PND, no orthopnea, no edema.  Resp: No SOB, + dry cough, no pleuritic pain. GI: No nausea, no vomiting, no diarrhea, no melena, no hematochezia, no constipation, + abdominal pain with coughing spells.  GU: No dysuria, no hematuria, no frequency, no urgency. MSK: + myalgias, no arthralgias.  Neuro:  + headache, no focal neurological deficits, no history of seizures.  Psych: No depression, no anxiety.  Endo: No heat intolerance, no cold intolerance, no polyuria, no polydipsia  Skin: No rashes, no skin lesions.  Heme: No easy bruising.  Travel history: No recent travel.   Past Medical History:   Past Medical History  Diagnosis Date  . Coronary artery disease 03/19/2002    R/P Cardiolite - EF 76%; nromal static and dynamic myocardial perfusion images; normal wall motion and  endocardial thickening in all vascular territories  . Hypertension   . Atrial fib/flutter, transient   . Skin cancer     s/p surgical removal.  . Pacemaker   . TIA (transient ischemic attack)   . CHF (congestive heart failure) 10/29/2009    Echo - EF >55%; normal LV size and systolic function; unable to assess diastolic fcn due to E/A fusion, pulmonary vein flow pattern suggests elevated filling pressure; marked biatrail dilation, mild/mod tricuspid regurgitation; mod pulmonary htn; mild/mod mitral regurgitation; although echocardiographic features are incomplete findings suggest possible infiltrative cardiomyopathy (maybe amyloidosi  . Facial numbness 12/26/2008    carotid doppler - R and L ICAs 0-49% diameter reduction (velocities suggest low end of scale)  . Atrial fibrillation, chronic   . Peripheral neuropathy     Past Surgical History:   Past Surgical History  Procedure Laterality Date  . Cardioversion  11/19/2009    successful DCCV from AF to sinus type rhythm  . Cardiac catheterization  08/06/2005    minimal coronary disease predominant RCA; no significant atherosclerosis; new onset sick sinus syndrome and atrial flutter w/ ventricular response, controlled on med therapy; systemic HTN, normal renal arteries  . Cholecystectomy    . Appendectomy    . Abdominal hysterectomy    . Skin cancer resection      Social History:   History   Social History  . Marital Status: Married    Spouse Name: N/A    Number of Children: 0  . Years of Education: N/A  Occupational History  . Not on file.   Social History Main Topics  . Smoking status: Never Smoker   . Smokeless tobacco: Not on file  . Alcohol Use: No  . Drug Use: No  . Sexual Activity: Not on file   Other Topics Concern  . Not on file   Social History Narrative   Widowed.  Lives alone.  Ambulates independently.    Family history:   Family History  Problem Relation Age of Onset  . Heart disease Mother   .  Diabetes Mother   . Heart attack Father   . Heart disease Father   . Cirrhosis Brother     Allergies   Ace inhibitors; Acrylic polymer; Augmentin; Chocolate; Ciprofloxacin; Gabapentin; Levaquin; and Zocor  Current Medications:   Prior to Admission medications   Medication Sig Start Date End Date Taking? Authorizing Provider  ALPRAZolam Duanne Moron) 1 MG tablet Take 0.5 mg by mouth at bedtime.   Yes Historical Provider, MD  aspirin EC 81 MG tablet Take 81 mg by mouth daily.   Yes Historical Provider, MD  carvedilol (COREG) 12.5 MG tablet Take 12.5 mg by mouth daily.    Yes Historical Provider, MD  cholecalciferol (VITAMIN D) 1000 UNITS tablet Take 1,000 Units by mouth 3 (three) times daily.    Yes Historical Provider, MD  diltiazem (TIAZAC) 360 MG 24 hr capsule Take 1 capsule (360 mg total) by mouth daily. 02/14/13  Yes Mihai [name], MD  furosemide (LASIX) 40 MG tablet Take 20 mg by mouth daily.   Yes Historical Provider, MD  gabapentin (NEURONTIN) 300 MG capsule Take 300 mg by mouth 2 (two) times daily.   Yes Historical Provider, MD  glucosamine-chondroitin 500-400 MG tablet Take 1 tablet by mouth daily.   Yes Historical Provider, MD  Magnesium 250 MG TABS Take 1 tablet by mouth daily.   Yes Historical Provider, MD  potassium chloride SA (K-DUR,KLOR-CON) 20 MEQ tablet take 1 tablet by mouth once daily 05/21/13  Yes Mihai [name], MD  Pseudoephedrine-Acetaminophen (TAVIST SINUS NON-DROWSY MAX ST PO) Take 1 tablet by mouth once.   Yes Historical Provider, MD  pyridOXINE (VITAMIN B-6) 50 MG tablet Take 50 mg by mouth daily.   Yes Historical Provider, MD  warfarin (COUMADIN) 3 MG tablet Take 3-4.5 mg by mouth See admin instructions. Takes 3mg  everyday except Monday and Friday's takes 4.5mg    Yes Historical Provider, MD    Physical Exam:   Filed Vitals:   08/24/13 1402 08/24/13 1423 08/24/13 1509  BP:  121/60   Pulse:  76   Temp:  99.3 F (37.4 C) 103.1 F (39.5 C)  TempSrc:  Oral  Rectal  Resp:  20   SpO2: 88% 94%      Physical Exam: Blood pressure 121/60, pulse 76, temperature 103.1 F (39.5 C), temperature source Rectal, resp. rate 20, SpO2 94.00%. Gen: No acute distress. Flushed in appearance. Head: Normocephalic, atraumatic. Eyes: PERRL, EOMI, sclerae nonicteric. Mouth: Oropharynx clear and moist. Neck: Supple, no thyromegaly, no lymphadenopathy, no jugular venous distention. Chest: Lungs with a few crackles. No wheezes. CV: Heart sounds are regular with no murmurs, rubs, or gallops. Abdomen: Softly distended, nontender, with normal active bowel sounds. Extremities: Extremities are without clubbing, edema, or cyanosis. Skin: Warm and dry. Neuro: Lethargic but oriented x2; cranial nerves II through XII grossly intact. Psych: Mood and affect flat.   Data Review:    Labs: Basic Metabolic Panel:  Recent Labs Lab 08/24/13 1431  NA 137  K  3.5*  CL 96  CO2 28  GLUCOSE 139*  BUN 14  CREATININE 0.73  CALCIUM 9.4   Liver Function Tests:  Recent Labs Lab 08/24/13 1431  AST 17  ALT 13  ALKPHOS 177*  BILITOT 1.6*  PROT 7.8  ALBUMIN 3.9   CBC:  Recent Labs Lab 08/24/13 1431  WBC 16.6*  NEUTROABS 12.9*  HGB 13.8  HCT 42.3  MCV 91.4  PLT 201   Urinalysis    Component Value Date/Time   COLORURINE AMBER* 08/24/2013 1509   APPEARANCEUR CLOUDY* 08/24/2013 1509   LABSPEC 1.014 08/24/2013 1509   PHURINE 5.5 08/24/2013 1509   GLUCOSEU NEGATIVE 08/24/2013 1509   HGBUR MODERATE* 08/24/2013 1509   BILIRUBINUR NEGATIVE 08/24/2013 1509   KETONESUR NEGATIVE 08/24/2013 1509   PROTEINUR NEGATIVE 08/24/2013 1509   UROBILINOGEN 1.0 08/24/2013 1509   NITRITE POSITIVE* 08/24/2013 1509   LEUKOCYTESUR TRACE* 08/24/2013 1509   Radiographic Studies: Dg Chest 2 View  08/24/2013   CLINICAL DATA:  cough  EXAM: CHEST  2 VIEW  COMPARISON:  DG CHEST 2 VIEW dated 06/04/2011  FINDINGS: Low lung volumes. Cardiac silhouette is enlarged. A left chest wall dual chamber  cardiac pacer. Areas of increased density within the lung bases left greater than right. There is prominence of interstitial markings and peribronchial cuffing. The osseous structures unremarkable.  IMPRESSION: Mild pulmonary edema.  Areas of atelectasis or scarring within the lung bases versus infiltrates.   Electronically Signed   By: Margaree Mackintosh M.D.   On: 08/24/2013 15:09    Assessment/Plan:   Principal Problem:   Sepsis secondary to CAP  Hemodynamically stable with normal serum lactate.  Antipyretics for fever as needed.  Blood cultures, urine culture, influenza panel, urinary Legionella antigen and strep pneumonia antigen ordered.  Chest radiography consistent with community-acquired pneumonia given infiltrates, and appears mildly toxic.  Start empiric Rocephin, azithromycin and Tamiflu. Active Problems:   Hypertension  Continue Coreg and diltiazem as blood pressure tolerates. Hold Lasix.   Atrial fibrillation / pacemaker  Continue Coumadin per pharmacy dosing.  Has pacemaker so rate is controlled.  Continue Coreg and diltiazem as blood pressure tolerates. DVT prophylaxis  On therapeutic dose Coumadin.   Code Status: Full. Family Communication: Deneise Lever (niece): [phone]. Disposition Plan: Home when stable.  Time spent: 70 minutes.  [address] [facility] Pager [phone] Cell: [phone]   If 7PM-7AM, please contact night-coverage [email or web address] Password [email or web address] 08/24/2013, 4:14 PM    **Disclaimer: This note was dictated with voice recognition software. Similar sounding words can inadvertently be transcribed and this note may contain transcription errors which may not have been corrected upon publication of note.**

## 2013-08-24 NOTE — ED Provider Notes (Signed)
CSN: [id number]     Arrival date & time 08/24/13  1402 History   First MD Initiated Contact with Patient 08/24/13 1409     Chief Complaint  Patient presents with  . Fatigue  . Fever  . Cough     (Consider location/radiation/quality/duration/timing/severity/associated sxs/prior Treatment) HPI Patient presents to the emergency department with fever, body aches, cough, weakness, for the last 4 days.  Patient, states, that she was feeling weak, to where she could not get up out of bed.  Today.  Patient denies chest pain, dizziness, headache, blurred vision vomiting diarrhea, nausea, abdominal pain, back pain, dysuria, neck pain, rash, or syncope.  The patient, states she did not take any medications prior to arrival.  Patient, states she has had persistent symptoms since they started Past Medical History  Diagnosis Date  . Coronary artery disease 03/19/2002    R/P Cardiolite - EF 76%; nromal static and dynamic myocardial perfusion images; normal wall motion and endocardial thickening in all vascular territories  . Hypertension   . Atrial fib/flutter, transient   . Skin cancer     s/p surgical removal.  . Pacemaker   . TIA (transient ischemic attack)   . CHF (congestive heart failure) 10/29/2009    Echo - EF >55%; normal LV size and systolic function; unable to assess diastolic fcn due to E/A fusion, pulmonary vein flow pattern suggests elevated filling pressure; marked biatrail dilation, mild/mod tricuspid regurgitation; mod pulmonary htn; mild/mod mitral regurgitation; although echocardiographic features are incomplete findings suggest possible infiltrative cardiomyopathy (maybe amyloidosi  . Facial numbness 12/26/2008    carotid doppler - R and L ICAs 0-49% diameter reduction (velocities suggest low end of scale)  . Atrial fibrillation, chronic   . Peripheral neuropathy    Past Surgical History  Procedure Laterality Date  . Cardioversion  11/19/2009    successful DCCV from AF to sinus  type rhythm  . Cardiac catheterization  08/06/2005    minimal coronary disease predominant RCA; no significant atherosclerosis; new onset sick sinus syndrome and atrial flutter w/ ventricular response, controlled on med therapy; systemic HTN, normal renal arteries  . Cholecystectomy    . Appendectomy    . Abdominal hysterectomy    . Skin cancer resection     Family History  Problem Relation Age of Onset  . Heart disease Mother   . Diabetes Mother   . Heart attack Father   . Heart disease Father   . Cirrhosis Brother    History  Substance Use Topics  . Smoking status: Never Smoker   . Smokeless tobacco: Not on file  . Alcohol Use: No   OB History   Grav Para Term Preterm Abortions TAB SAB Ect Mult Living                 Review of Systems All other systems negative except as documented in the HPI. All pertinent positives and negatives as reviewed in the HPI.   Allergies  Ace inhibitors; Acrylic polymer; Augmentin; Chocolate; Ciprofloxacin; Gabapentin; Levaquin; and Zocor  Home Medications   Prior to Admission medications   Medication Sig Start Date End Date Taking? Authorizing Provider  ALPRAZolam Duanne Moron) 1 MG tablet Take 0.5 mg by mouth at bedtime.   Yes Historical Provider, MD  aspirin EC 81 MG tablet Take 81 mg by mouth daily.   Yes Historical Provider, MD  carvedilol (COREG) 12.5 MG tablet Take 12.5 mg by mouth daily.    Yes Historical Provider, MD  cholecalciferol (VITAMIN D)  1000 UNITS tablet Take 1,000 Units by mouth 3 (three) times daily.    Yes Historical Provider, MD  diltiazem (TIAZAC) 360 MG 24 hr capsule Take 1 capsule (360 mg total) by mouth daily. 02/14/13  Yes Mihai [name], MD  furosemide (LASIX) 40 MG tablet Take 20 mg by mouth daily.   Yes Historical Provider, MD  gabapentin (NEURONTIN) 300 MG capsule Take 300 mg by mouth 2 (two) times daily.   Yes Historical Provider, MD  glucosamine-chondroitin 500-400 MG tablet Take 1 tablet by mouth daily.   Yes  Historical Provider, MD  Magnesium 250 MG TABS Take 1 tablet by mouth daily.   Yes Historical Provider, MD  potassium chloride SA (K-DUR,KLOR-CON) 20 MEQ tablet take 1 tablet by mouth once daily 05/21/13  Yes Mihai [name], MD  Pseudoephedrine-Acetaminophen (TAVIST SINUS NON-DROWSY MAX ST PO) Take 1 tablet by mouth once.   Yes Historical Provider, MD  pyridOXINE (VITAMIN B-6) 50 MG tablet Take 50 mg by mouth daily.   Yes Historical Provider, MD  warfarin (COUMADIN) 3 MG tablet Take 3-4.5 mg by mouth See admin instructions. Takes 3mg  everyday except Monday and Friday's takes 4.5mg    Yes Historical Provider, MD   BP 121/60  Pulse 76  Temp(Src) 103.1 F (39.5 C) (Rectal)  Resp 20  SpO2 94% Physical Exam  Nursing note and vitals reviewed. Constitutional: She is oriented to person, place, and time. She appears well-developed and well-nourished. No distress.  HENT:  Head: Normocephalic and atraumatic.  Mouth/Throat: Oropharynx is clear and moist.  Eyes: Pupils are equal, round, and reactive to light.  Neck: Normal range of motion. Neck supple.  Cardiovascular: Normal rate, regular rhythm and normal heart sounds.  Exam reveals no gallop and no friction rub.   No murmur heard. Pulmonary/Chest: Effort normal. She has no decreased breath sounds. She has no wheezes. She has rhonchi in the right lower field.  Neurological: She is alert and oriented to person, place, and time. She exhibits normal muscle tone. Coordination normal.  Skin: Skin is warm and dry. No rash noted. No erythema.    ED Course  Procedures (including critical care time) Labs Review Labs Reviewed  CBC WITH DIFFERENTIAL - Abnormal; Notable for the following:    WBC 16.6 (*)    Neutrophils Relative % 78 (*)    Neutro Abs 12.9 (*)    Monocytes Absolute 1.7 (*)    All other components within normal limits  COMPREHENSIVE METABOLIC PANEL - Abnormal; Notable for the following:    Potassium 3.5 (*)    Glucose, Bld 139 (*)     Alkaline Phosphatase 177 (*)    Total Bilirubin 1.6 (*)    GFR calc non Af Amer 76 (*)    GFR calc Af Amer 88 (*)    All other components within normal limits  URINALYSIS, ROUTINE W REFLEX MICROSCOPIC - Abnormal; Notable for the following:    Color, Urine AMBER (*)    APPearance CLOUDY (*)    Hgb urine dipstick MODERATE (*)    Nitrite POSITIVE (*)    Leukocytes, UA TRACE (*)    All other components within normal limits  URINE MICROSCOPIC-ADD ON - Abnormal; Notable for the following:    Squamous Epithelial / LPF FEW (*)    Bacteria, UA MANY (*)    All other components within normal limits  URINE CULTURE  CULTURE, BLOOD (ROUTINE X 2)  CULTURE, BLOOD (ROUTINE X 2)  PROTIME-INR  INFLUENZA PANEL BY PCR (TYPE A &  B, H1N1)  I-STAT CG4 LACTIC ACID, ED    Imaging Review Dg Chest 2 View  08/24/2013   CLINICAL DATA:  cough  EXAM: CHEST  2 VIEW  COMPARISON:  DG CHEST 2 VIEW dated 06/04/2011  FINDINGS: Low lung volumes. Cardiac silhouette is enlarged. A left chest wall dual chamber cardiac pacer. Areas of increased density within the lung bases left greater than right. There is prominence of interstitial markings and peribronchial cuffing. The osseous structures unremarkable.  IMPRESSION: Mild pulmonary edema.  Areas of atelectasis or scarring within the lung bases versus infiltrates.   Electronically Signed   By: Margaree Mackintosh M.D.   On: 08/24/2013 15:09   Patient be admitted to the hospital for further evaluation and care of her hypoxia, fever, and questionable infiltrates on chest x-ray.  Should be treated for community-acquired pneumonia.  I spoke with the, [facility], who will admit the patient    Brent General, PA-C 08/24/13 1604

## 2013-08-24 NOTE — ED Notes (Signed)
Patient transported to X-ray 

## 2013-08-24 NOTE — ED Notes (Signed)
Bed: WA09 Expected date:  Expected time:  Means of arrival:  Comments: EMS-temp/weak

## 2013-08-25 DIAGNOSIS — Z7901 Long term (current) use of anticoagulants: Secondary | ICD-10-CM

## 2013-08-25 LAB — INFLUENZA PANEL BY PCR (TYPE A & B)
H1N1 flu by pcr: NOT DETECTED
Influenza A By PCR: NEGATIVE
Influenza B By PCR: NEGATIVE

## 2013-08-25 LAB — LEGIONELLA ANTIGEN, URINE: Legionella Antigen, Urine: NEGATIVE

## 2013-08-25 LAB — PROTIME-INR
INR: 1.79 — ABNORMAL HIGH (ref 0.00–1.49)
Prothrombin Time: 20.3 seconds — ABNORMAL HIGH (ref 11.6–15.2)

## 2013-08-25 LAB — STREP PNEUMONIAE URINARY ANTIGEN: Strep Pneumo Urinary Antigen: NEGATIVE

## 2013-08-25 MED ORDER — ALPRAZOLAM 0.5 MG PO TABS
0.5000 mg | ORAL_TABLET | Freq: Every evening | ORAL | Status: DC | PRN
  Administered 2013-08-26: 0.5 mg via ORAL
  Filled 2013-08-25: qty 1

## 2013-08-25 MED ORDER — WARFARIN SODIUM 5 MG PO TABS
5.0000 mg | ORAL_TABLET | Freq: Once | ORAL | Status: AC
  Administered 2013-08-25: 5 mg via ORAL
  Filled 2013-08-25: qty 1

## 2013-08-25 NOTE — Progress Notes (Signed)
ANTICOAGULATION CONSULT NOTE - Follow up  Pharmacy Consult for Warfarin Indication: atrial fibrillation  Allergies  Allergen Reactions  . Ace Inhibitors     Unknown   . Acrylic Polymer [Carbomer]   . Augmentin [Amoxicillin-Pot Clavulanate]   . Chocolate Other (See Comments)    migraine's   . Ciprofloxacin   . Gabapentin Other (See Comments)    Makes her sleepy   . Levaquin [Levofloxacin In D5w]   . Zocor [Simvastatin]     Patient Measurements: Height: 5\' 4"  (162.6 cm) Weight: 147 lb 14.9 oz (67.1 kg) IBW/kg (Calculated) : 54.7  Vital Signs: Temp: 97.9 F (36.6 C) (04/25 0440) Temp src: Oral (04/25 0440) BP: 103/65 mmHg (04/25 0440) Pulse Rate: 71 (04/25 0651)  Labs: Estimated Creatinine Clearance: 49.3 ml/min (by C-G formula based on Cr of 0.73).  Assessment: 37 yoF with PMHx CAD, HTN, CHF (EF 55%), TIA, and afib with pacemaker on chronic warfarin admitted 4/24 with fever, body aches, cough, and weakness for 4 days PTA and pt started on abx for CAP.  Pharmacy consulted to resume warfarin dosing.  INR on admission is subtherapeutic.   Home dose warfarin: 3mg  daily except 4.5mg  M/F, last dose 08/23/13.    CBC: Hgb and plts WNL on 4/24  INR 1.79, subtherapeutic  Cardiac diet ordered, noted to have poor PO intake  Bleeding risk increased with azithromycin, will monitor closely, also on ASA 81mg  daily.  Goal of Therapy:  INR 2-3 Monitor platelets by anticoagulation protocol: Yes   Plan:  Warfarin 5mg  po x 1 tonight Daily PT/INR  Thank you for the consult.  Johny Drilling, PharmD, BCPS Pager: [phone] Pharmacy: [phone] 08/25/2013 9:33 AM

## 2013-08-25 NOTE — Progress Notes (Signed)
Progress Note   Misty Blackwell YBO:[id number] DOB: 07-22-1927 DOA: 08/24/2013 PCP: Alesia Richards, MD   Brief Narrative:   Misty Blackwell is an 80 y.o. female with a PMH of CAD, HTN and atrial fibrillation on chronic Coumadin who was admitted 08/24/13 with a 3 day history of upper respiratory symptoms including fever/chills, cough, body aches, pharyngitis, weakness/lethargy, found to be septic with a rectal temperature of 103.1, WBC of 16.6, and infiltrates on chest radiography consistent with community-acquired pneumonia.  Assessment/Plan:   Principal Problem:  Sepsis secondary to CAP   Chest x-ray consistent with community-acquired pneumonia. No elevation of serum lactate.  Strep pneumonia negative. Influenza panel negative. Discontinue empiric Tamiflu.  Followup blood cultures, urine culture, urinary Legionella antigen.   Continue empiric Rocephin and azithromycin. Active Problems:  Hypokalemia  Potassium added to IV fluids. Hypertension   Continue Coreg and diltiazem as blood pressure tolerates. Hold Lasix. Atrial fibrillation / pacemaker   Continue Coumadin per pharmacy dosing.   Has pacemaker so rate is controlled.   Continue Coreg and diltiazem as blood pressure tolerates. DVT prophylaxis   On therapeutic dose Coumadin.  Code Status: Full. Family Communication: Family updated at bedside. Disposition Plan: Home when stable.   IV Access:    Peripheral IV   Procedures:    None.   Medical Consultants:    None.   Other Consultants:    None.   Anti-Infectives:    Rocephin 08/24/13--->  Azithromycin 08/24/13--->  Tamiflu 08/24/13---> 08/25/13  Subjective:   Misty Blackwell says she feels better. She is more alert today. She continues to have some cough and body aches. No fevers overnight.  Objective:    Filed Vitals:   08/25/13 0435 08/25/13 0440 08/25/13 0456 08/25/13 0651  BP:  103/65    Pulse: 76 66 60 71  Temp:  97.9  F (36.6 C)    TempSrc:  Oral    Resp:  16    Height:      Weight:      SpO2: 97% 97% 97% 96%    Intake/Output Summary (Last 24 hours) at 08/25/13 0900 Last data filed at 08/25/13 2778  Gross per 24 hour  Intake 1462.33 ml  Output    650 ml  Net 812.33 ml    Exam: Gen:  NAD Cardiovascular:  RRR, No M/R/G Respiratory:  Lungs with a few crackles on the left Gastrointestinal:  Abdomen soft, NT/ND, + BS Extremities:  No C/E/C   Data Reviewed:    Labs: Basic Metabolic Panel:  Recent Labs Lab 08/24/13 1431  NA 137  K 3.5*  CL 96  CO2 28  GLUCOSE 139*  BUN 14  CREATININE 0.73  CALCIUM 9.4   GFR Estimated Creatinine Clearance: 49.3 ml/min (by C-G formula based on Cr of 0.73). Liver Function Tests:  Recent Labs Lab 08/24/13 1431  AST 17  ALT 13  ALKPHOS 177*  BILITOT 1.6*  PROT 7.8  ALBUMIN 3.9   Coagulation profile  Recent Labs Lab 08/24/13 1430 08/25/13 0435  INR 1.50* 1.79*    CBC:  Recent Labs Lab 08/24/13 1431  WBC 16.6*  NEUTROABS 12.9*  HGB 13.8  HCT 42.3  MCV 91.4  PLT 201   Sepsis Labs:  Recent Labs Lab 08/24/13 1431 08/24/13 1455  WBC 16.6*  --   LATICACIDVEN  --  1.66   Microbiology No results found for this or any previous visit (from the past 240 hour(s)).   Radiographs/Studies:  Dg Chest 2 View  08/24/2013   CLINICAL DATA:  cough  EXAM: CHEST  2 VIEW  COMPARISON:  DG CHEST 2 VIEW dated 06/04/2011  FINDINGS: Low lung volumes. Cardiac silhouette is enlarged. A left chest wall dual chamber cardiac pacer. Areas of increased density within the lung bases left greater than right. There is prominence of interstitial markings and peribronchial cuffing. The osseous structures unremarkable.  IMPRESSION: Mild pulmonary edema.  Areas of atelectasis or scarring within the lung bases versus infiltrates.   Electronically Signed   By: Margaree Mackintosh M.D.   On: 08/24/2013 15:09    Medications:   . aspirin EC  81 mg Oral Daily  .  azithromycin  500 mg Intravenous Q24H  . carvedilol  12.5 mg Oral Daily  . cefTRIAXone (ROCEPHIN)  IV  1 g Intravenous Q24H  . cholecalciferol  1,000 Units Oral TID  . diltiazem  360 mg Oral Daily  . gabapentin  300 mg Oral BID  . magnesium oxide  400 mg Oral Daily  . oseltamivir  75 mg Oral BID  . potassium chloride SA  20 mEq Oral Daily  . pyridOXINE  50 mg Oral Daily  . Warfarin - Pharmacist Dosing Inpatient   Does not apply q1800   Continuous Infusions: . 0.9 % NaCl with KCl 20 mEq / L 1,000 mL (08/25/13 0455)    Time spent: 35 minutes with > 50% of time discussing current diagnostic test results, clinical impression and plan of care.    LOS: 1 day   [facility] Pager [phone]. If unable to reach me by pager, please call my cell phone at [phone].  *Please refer to [email or web address], password [email or web address] to get updated schedule on who will round on this patient, as hospitalists switch teams weekly. If 7PM-7AM, please contact night-coverage at [email or web address], password [email or web address] for any overnight needs.  08/25/2013, 9:00 AM    **Disclaimer: This note was dictated with voice recognition software. Similar sounding words can inadvertently be transcribed and this note may contain transcription errors which may not have been corrected upon publication of note.**   Information printed out and reviewed with the patient/family:     In an effort to keep you and your family informed about your hospital stay, I am providing you with this information sheet. If you or your family have any questions, please do not hesitate to have the nursing staff page me to set up a meeting time.  Misty Blackwell 08/25/2013 1 (Number of days in the hospital)  Treatment team:  Dr. Jacquelynn Cree, Hospitalist (Internist)  Active Treatment Issues with Plan: Principal Problem:  Pneumonia   Your x-ray done on admission showed pneumonia  Your tests are negative for flu.  You are being  treated with antibiotics. Active Problems:  Low potassium  Potassium added to your IV fluids. History of high blood pressure   Your blood pressure was low when you came in.  Continue Coreg and diltiazem as blood pressure tolerates. Hold Lasix. Irregular heartbeat (atrial fibrillation) / pacemaker   Continue Coumadin.   Continue Coreg and diltiazem as blood pressure tolerates.  Anticipated discharge date: Depends on when you feel better.

## 2013-08-25 NOTE — Progress Notes (Signed)
PT Cancellation Note  Patient Details Name: Misty Blackwell MRN: [id number] DOB: 1927-06-26   Cancelled Treatment:    Reason Eval/Treat Not Completed: PT screened, no needs identified, will sign off; pt states she is at her baseline, moving I'ly   Neil Crouch 08/25/2013, 12:04 PM

## 2013-08-25 NOTE — Plan of Care (Signed)
Problem: Phase I Progression Outcomes Goal: Tolerating diet Outcome: Not Met (add Reason) Patient with poor appetite and po intake

## 2013-08-25 NOTE — Plan of Care (Signed)
Problem: Phase I Progression Outcomes Goal: O2 sats > or equal 90% or at baseline Outcome: Progressing Weaning oxygen as tolerates

## 2013-08-26 DIAGNOSIS — I10 Essential (primary) hypertension: Secondary | ICD-10-CM

## 2013-08-26 LAB — BASIC METABOLIC PANEL
BUN: 14 mg/dL (ref 6–23)
CALCIUM: 8.6 mg/dL (ref 8.4–10.5)
CO2: 25 meq/L (ref 19–32)
Chloride: 101 mEq/L (ref 96–112)
Creatinine, Ser: 0.62 mg/dL (ref 0.50–1.10)
GFR calc Af Amer: >90 mL/min (ref >90)
GFR calc non Af Amer: 81 mL/min — ABNORMAL LOW (ref >90)
Glucose, Bld: 96 mg/dL (ref 70–99)
Potassium: 3.8 mEq/L (ref 3.7–5.3)
Sodium: 137 mEq/L (ref 137–147)

## 2013-08-26 LAB — PROTIME-INR
INR: 1.93 — ABNORMAL HIGH (ref 0.00–1.49)
PROTHROMBIN TIME: 21.5 s — AB (ref 11.6–15.2)

## 2013-08-26 LAB — CBC
HCT: 37.5 % (ref 36.0–46.0)
Hemoglobin: 12 g/dL (ref 12.0–15.0)
MCH: 30.1 pg (ref 26.0–34.0)
MCHC: 32 g/dL (ref 30.0–36.0)
MCV: 94 fL (ref 78.0–100.0)
Platelets: 190 10*3/uL (ref 150–400)
RBC: 3.99 MIL/uL (ref 3.87–5.11)
RDW: 13.4 % (ref 11.5–15.5)
WBC: 13.5 10*3/uL — AB (ref 4.0–10.5)

## 2013-08-26 LAB — URINE CULTURE: Colony Count: >=100000

## 2013-08-26 MED ORDER — WARFARIN SODIUM 3 MG PO TABS
3.0000 mg | ORAL_TABLET | Freq: Once | ORAL | Status: AC
  Administered 2013-08-26: 3 mg via ORAL
  Filled 2013-08-26: qty 1

## 2013-08-26 MED ORDER — ALBUTEROL SULFATE (2.5 MG/3ML) 0.083% IN NEBU
2.5000 mg | INHALATION_SOLUTION | RESPIRATORY_TRACT | Status: DC | PRN
  Administered 2013-08-26: 2.5 mg via RESPIRATORY_TRACT

## 2013-08-26 MED ORDER — ALPRAZOLAM 0.25 MG PO TABS: 0.2500 mg | ORAL_TABLET | ORAL | Status: DC | PRN

## 2013-08-26 NOTE — Progress Notes (Signed)
Progress Note   Misty Blackwell [id number] DOB: April 29, 1928 DOA: 08/24/2013 PCP: Alesia Richards, MD   Brief Narrative:   Misty Blackwell is an 80 y.o. female with a PMH of CAD, HTN and atrial fibrillation on chronic Coumadin who was admitted 08/24/13 with a 3 day history of upper respiratory symptoms including fever/chills, cough, body aches, pharyngitis, weakness/lethargy, found to be septic with a rectal temperature of 103.1, WBC of 16.6, and infiltrates on chest radiography consistent with community-acquired pneumonia.  Assessment/Plan:   Principal Problem:  Sepsis secondary to CAP/E Coli UTI   Chest x-ray consistent with community-acquired pneumonia. Urine cultures positive for Escherichia coli.  No elevation of serum lactate.  Strep pneumonia and Legionella antigens negative. Influenza panel negative.   Followup blood cultures.   Continue empiric Rocephin and azithromycin. Active Problems:  Hypokalemia  Potassium added to IV fluids. Hypertension   Continue Coreg and diltiazem as blood pressure tolerates. Hold Lasix. Atrial fibrillation / pacemaker   Continue Coumadin per pharmacy dosing.   Has pacemaker so rate is controlled.   Continue Coreg and diltiazem as blood pressure tolerates. DVT prophylaxis   On therapeutic dose Coumadin.  Code Status: Full. Family Communication: Family updated at bedside. Disposition Plan: Home when stable.   IV Access:    Peripheral IV   Procedures:    None.   Medical Consultants:    None.   Other Consultants:    None.   Anti-Infectives:    Rocephin 08/24/13--->  Azithromycin 08/24/13--->  Tamiflu 08/24/13---> 08/25/13  Subjective:   Misty Blackwell says she feels better. Still reports some cough with dark sputum production. Appetite picking up. No complaints of pain or dyspnea.  Objective:    Filed Vitals:   08/25/13 0942 08/25/13 1340 08/25/13 2123 08/26/13 0539  BP:  114/48 155/71 152/85   Pulse:  69 74 85  Temp:  99.5 F (37.5 C) 97.8 F (36.6 C) 98.2 F (36.8 C)  TempSrc:  Oral Oral Oral  Resp:  16 20 18   Height:      Weight:      SpO2: 92% 93% 92% 92%    Intake/Output Summary (Last 24 hours) at 08/26/13 0849 Last data filed at 08/26/13 0540  Gross per 24 hour  Intake 986.67 ml  Output    900 ml  Net  86.67 ml    Exam: Gen:  NAD Cardiovascular:  RRR, No M/R/G Respiratory:  Lungs with a few crackles on the left Gastrointestinal:  Abdomen soft, NT/ND, + BS Extremities:  No C/E/C   Data Reviewed:    Labs: Basic Metabolic Panel:  Recent Labs Lab 08/24/13 1431 08/26/13 0511  NA 137 137  K 3.5* 3.8  CL 96 101  CO2 28 25  GLUCOSE 139* 96  BUN 14 14  CREATININE 0.73 0.62  CALCIUM 9.4 8.6   GFR Estimated Creatinine Clearance: 49.3 ml/min (by C-G formula based on Cr of 0.62). Liver Function Tests:  Recent Labs Lab 08/24/13 1431  AST 17  ALT 13  ALKPHOS 177*  BILITOT 1.6*  PROT 7.8  ALBUMIN 3.9   Coagulation profile  Recent Labs Lab 08/24/13 1430 08/25/13 0435 08/26/13 0511  INR 1.50* 1.79* 1.93*    CBC:  Recent Labs Lab 08/24/13 1431 08/26/13 0511  WBC 16.6* 13.5*  NEUTROABS 12.9*  --   HGB 13.8 12.0  HCT 42.3 37.5  MCV 91.4 94.0  PLT 201 190   Sepsis Labs:  Recent Labs Lab 08/24/13 1431 08/24/13  1455 08/26/13 0511  WBC 16.6*  --  13.5*  LATICACIDVEN  --  1.66  --    Microbiology Recent Results (from the past 240 hour(s))  URINE CULTURE     Status: None   Collection Time    08/24/13  3:09 PM      Result Value Ref Range Status   Specimen Description URINE, CLEAN CATCH   Final   Special Requests NONE   Final   Culture  Setup Time     Final   Value: 08/24/2013 22:39     Performed at [facility]     Final   Value: >=100,000 COLONIES/ML     Performed at [facility]   Culture     Final   Value: ESCHERICHIA COLI     Performed at [facility]   Report Status PENDING    Incomplete  CULTURE, BLOOD (ROUTINE X 2)     Status: None   Collection Time    08/24/13  3:25 PM      Result Value Ref Range Status   Specimen Description BLOOD BLOOD LEFT FOREARM   Final   Special Requests BOTTLES DRAWN AEROBIC AND ANAEROBIC 5CC   Final   Culture  Setup Time     Final   Value: 08/24/2013 22:08     Performed at [facility]   Culture     Final   Value:        BLOOD CULTURE RECEIVED NO GROWTH TO DATE CULTURE WILL BE HELD FOR 5 DAYS BEFORE ISSUING A FINAL NEGATIVE REPORT     Performed at [facility]   Report Status PENDING   Incomplete  CULTURE, BLOOD (ROUTINE X 2)     Status: None   Collection Time    08/24/13  3:34 PM      Result Value Ref Range Status   Specimen Description BLOOD BLOOD RIGHT FOREARM   Final   Special Requests BOTTLES DRAWN AEROBIC AND ANAEROBIC 5CC   Final   Culture  Setup Time     Final   Value: 08/24/2013 22:09     Performed at [facility]   Culture     Final   Value:        BLOOD CULTURE RECEIVED NO GROWTH TO DATE CULTURE WILL BE HELD FOR 5 DAYS BEFORE ISSUING A FINAL NEGATIVE REPORT     Performed at [facility]   Report Status PENDING   Incomplete     Radiographs/Studies:   Dg Chest 2 View  08/24/2013   CLINICAL DATA:  cough  EXAM: CHEST  2 VIEW  COMPARISON:  DG CHEST 2 VIEW dated 06/04/2011  FINDINGS: Low lung volumes. Cardiac silhouette is enlarged. A left chest wall dual chamber cardiac pacer. Areas of increased density within the lung bases left greater than right. There is prominence of interstitial markings and peribronchial cuffing. The osseous structures unremarkable.  IMPRESSION: Mild pulmonary edema.  Areas of atelectasis or scarring within the lung bases versus infiltrates.   Electronically Signed   By: Margaree Mackintosh M.D.   On: 08/24/2013 15:09    Medications:   . aspirin EC  81 mg Oral Daily  . azithromycin  500 mg Intravenous Q24H  . carvedilol  12.5 mg Oral Daily  . cefTRIAXone (ROCEPHIN)   IV  1 g Intravenous Q24H  . cholecalciferol  1,000 Units Oral TID  . diltiazem  360 mg Oral Daily  . gabapentin  300 mg Oral BID  . magnesium oxide  400 mg Oral Daily  . potassium chloride SA  20 mEq Oral Daily  . pyridOXINE  50 mg Oral Daily  . Warfarin - Pharmacist Dosing Inpatient   Does not apply q1800   Continuous Infusions: . 0.9 % NaCl with KCl 20 mEq / L 10 mL/hr at 08/25/13 1608    Time spent: 25 minutes.    LOS: 2 days   [place]  [facility] Pager [phone]. If unable to reach me by pager, please call my cell phone at [phone].  *Please refer to [email or web address], password [email or web address] to get updated schedule on who will round on this patient, as hospitalists switch teams weekly. If 7PM-7AM, please contact night-coverage at [email or web address], password [email or web address] for any overnight needs.  08/26/2013, 8:49 AM    **Disclaimer: This note was dictated with voice recognition software. Similar sounding words can inadvertently be transcribed and this note may contain transcription errors which may not have been corrected upon publication of note.**   Information printed out and reviewed with the patient/family:     In an effort to keep you and your family informed about your hospital stay, I am providing you with this information sheet. If you or your family have any questions, please do not hesitate to have the nursing staff page me to set up a meeting time.

## 2013-08-26 NOTE — Progress Notes (Signed)
ANTICOAGULATION CONSULT NOTE - Follow up  Pharmacy Consult for Warfarin Indication: atrial fibrillation  Allergies  Allergen Reactions  . Ace Inhibitors     Unknown   . Acrylic Polymer [Carbomer]   . Augmentin [Amoxicillin-Pot Clavulanate]   . Chocolate Other (See Comments)    migraine's   . Ciprofloxacin   . Gabapentin Other (See Comments)    Makes her sleepy   . Levaquin [Levofloxacin In D5w]   . Zocor [Simvastatin]     Patient Measurements: Height: 5\' 4"  (162.6 cm) Weight: 147 lb 14.9 oz (67.1 kg) IBW/kg (Calculated) : 54.7  Vital Signs: Temp: 98.2 F (36.8 C) (04/26 0539) Temp src: Oral (04/26 0539) BP: 152/85 mmHg (04/26 0539) Pulse Rate: 85 (04/26 0539)  Labs: Estimated Creatinine Clearance: 49.3 ml/min (by C-G formula based on Cr of 0.62).  Assessment: Misty Blackwell with PMHx CAD, HTN, CHF (EF Misty%), TIA, and afib with pacemaker on chronic warfarin admitted 4/24 with fever, body aches, cough, and weakness for 4 days PTA and pt started on abx for CAP.  Pharmacy consulted to resume warfarin dosing.  INR on admission is subtherapeutic.   Home dose warfarin: 3mg  daily except 4.5mg  M/F, last dose 08/23/13.    Hgb with slight decrease from admission to 12.0, no bleeding noted  PLTS ok  INR 1.93, rising but still slightly subtherapeutic  Cardiac diet ordered, noted to have poor PO intake  Bleeding risk increased with azithromycin, will monitor closely, also on ASA 81mg  daily.  Goal of Therapy:  INR 2-3 Monitor platelets by anticoagulation protocol: Yes   Plan:  Warfarin 3mg  po x 1 tonight Daily PT/INR  Thank you for the consult.  Johny Drilling, PharmD, BCPS Pager: [phone] Pharmacy: [phone] 08/26/2013 10:05 AM

## 2013-08-27 ENCOUNTER — Other Ambulatory Visit: Payer: Medicare Other

## 2013-08-27 DIAGNOSIS — Z1212 Encounter for screening for malignant neoplasm of rectum: Secondary | ICD-10-CM

## 2013-08-27 LAB — POC HEMOCCULT BLD/STL (HOME/3-CARD/SCREEN)
Card #2 Fecal Occult Blod, POC: NEGATIVE
FECAL OCCULT BLD: NEGATIVE
Fecal Occult Blood, POC: NEGATIVE

## 2013-08-27 LAB — PROTIME-INR
INR: 2.28 — ABNORMAL HIGH (ref 0.00–1.49)
Prothrombin Time: 24.4 seconds — ABNORMAL HIGH (ref 11.6–15.2)

## 2013-08-27 MED ORDER — DOXYCYCLINE HYCLATE 50 MG PO CAPS: 50.0000 mg | ORAL_CAPSULE | Freq: Two times a day (BID) | ORAL | Status: DC

## 2013-08-27 MED ORDER — FUROSEMIDE 20 MG PO TABS
20.0000 mg | ORAL_TABLET | Freq: Once | ORAL | Status: AC
  Administered 2013-08-27: 20 mg via ORAL
  Filled 2013-08-27: qty 1

## 2013-08-27 MED ORDER — AZITHROMYCIN 250 MG PO TABS
250.0000 mg | ORAL_TABLET | Freq: Once | ORAL | Status: AC
  Administered 2013-08-27: 250 mg via ORAL
  Filled 2013-08-27: qty 1

## 2013-08-27 MED ORDER — DOXYCYCLINE HYCLATE 100 MG PO TABS
100.0000 mg | ORAL_TABLET | Freq: Once | ORAL | Status: AC
  Administered 2013-08-27: 100 mg via ORAL
  Filled 2013-08-27: qty 1

## 2013-08-27 MED ORDER — ALBUTEROL SULFATE HFA 108 (90 BASE) MCG/ACT IN AERS: 2.0000 | INHALATION_SPRAY | Freq: Four times a day (QID) | RESPIRATORY_TRACT | Status: DC | PRN

## 2013-08-27 MED ORDER — AZITHROMYCIN 250 MG PO TABS: ORAL_TABLET | ORAL | Status: DC

## 2013-08-27 NOTE — Discharge Instructions (Signed)
Pneumonia, Adult °Pneumonia is an infection of the lungs.  °CAUSES °Pneumonia may be caused by bacteria or a virus. Usually, these infections are caused by breathing infectious particles into the lungs (respiratory tract). °SYMPTOMS  °· Cough. °· Fever. °· Chest pain. °· Increased rate of breathing. °· Wheezing. °· Mucus production. °DIAGNOSIS  °If you have the common symptoms of pneumonia, your caregiver will typically confirm the diagnosis with a chest X-ray. The X-ray will show an abnormality in the lung (pulmonary infiltrate) if you have pneumonia. Other tests of your blood, urine, or sputum may be done to find the specific cause of your pneumonia. Your caregiver may also do tests (blood gases or pulse oximetry) to see how well your lungs are working. °TREATMENT  °Some forms of pneumonia may be spread to other people when you cough or sneeze. You may be asked to wear a mask before and during your exam. Pneumonia that is caused by bacteria is treated with antibiotic medicine. Pneumonia that is caused by the influenza virus may be treated with an antiviral medicine. Most other viral infections must run their course. These infections will not respond to antibiotics.  °PREVENTION °A pneumococcal shot (vaccine) is available to prevent a common bacterial cause of pneumonia. This is usually suggested for: °· People over 65 years old. °· Patients on chemotherapy. °· People with chronic lung problems, such as bronchitis or emphysema. °· People with immune system problems. °If you are over 65 or have a high risk condition, you may receive the pneumococcal vaccine if you have not received it before. In some countries, a routine influenza vaccine is also recommended. This vaccine can help prevent some cases of pneumonia. You may be offered the influenza vaccine as part of your care. °If you smoke, it is time to quit. You may receive instructions on how to stop smoking. Your caregiver can provide medicines and counseling to  help you quit. °HOME CARE INSTRUCTIONS  °· Cough suppressants may be used if you are losing too much rest. However, coughing protects you by clearing your lungs. You should avoid using cough suppressants if you can. °· Your caregiver may have prescribed medicine if he or she thinks your pneumonia is caused by a bacteria or influenza. Finish your medicine even if you start to feel better. °· Your caregiver may also prescribe an expectorant. This loosens the mucus to be coughed up. °· Only take over-the-counter or prescription medicines for pain, discomfort, or fever as directed by your caregiver. °· Do not smoke. Smoking is a common cause of bronchitis and can contribute to pneumonia. If you are a smoker and continue to smoke, your cough may last several weeks after your pneumonia has cleared. °· A cold steam vaporizer or humidifier in your room or home may help loosen mucus. °· Coughing is often worse at night. Sleeping in a semi-upright position in a recliner or using a couple pillows under your head will help with this. °· Get rest as you feel it is needed. Your body will usually let you know when you need to rest. °SEEK IMMEDIATE MEDICAL CARE IF:  °· Your illness becomes worse. This is especially true if you are elderly or weakened from any other disease. °· You cannot control your cough with suppressants and are losing sleep. °· You begin coughing up blood. °· You develop pain which is getting worse or is uncontrolled with medicines. °· You have a fever. °· Any of the symptoms which initially brought you in for treatment   are getting worse rather than better.  You develop shortness of breath or chest pain. MAKE SURE YOU:   Understand these instructions.  Will watch your condition.  Will get help right away if you are not doing well or get worse. Document Released: 04/19/2005 Document Revised: 07/12/2011 Document Reviewed: 07/09/2010 [facility] Patient Information 2014 [place],  [place].  Urosepsis Urosepsis is a severe illness that occurs when an infection starts in your urinary tract and spreads into your bloodstream. Urosepsis can be life threatening if it is not treated immediately. CAUSES  It is not known why urosepsis develops in some people with a urinary tract infection. RISK FACTORS Factors that increase your risk of developing urosepsis include:  Advanced age.  Having diabetes mellitus.  Having a weakened immune system.  Having kidney stones. SIGNS AND SYMPTOMS  Symptoms of urosepsis can include:  Fever or low body temperature (hypothermia).  Rapid breathing (hyperventilation).  Chills.  Rapid heartbeat (tachycardia). When sepsis is severe, low blood pressure and decreased oxygen flow to your vital organs may also occur. This is called shock. DIAGNOSIS  Your health care provider may suspect urosepsis based on your medical history and a physical exam. Urine and blood tests may be done to confirm a diagnosis of urosepsis. Other tests, such as X-ray exams, ultrasound exams, or a CT scan, may be done to determine the severity of your condition.  TREATMENT  Urosepsis requires prompt treatment with medicines. Fluids will be given to you through an IV tube to support your blood pressure. Sometimes, strong medicines are used to increase your blood pressure if necessary. Medicines can also be used to control pain or nausea.  HOME CARE INSTRUCTIONS   Take your medicines as directed. Finish them even if you start to feel better.  Drink enough fluids to keep your urine clear or pale yellow. Cranberry juice is especially recommended, in addition to water.  Increase your activity as tolerated, but get plenty of rest.  Keep all follow-up appointments with your health care provider for checkups and any tests.  Avoid caffeine, tea, and carbonated beverages. They can irritate the bladder.  Avoid alcohol. It may irritate the prostate, if this applies.  Only  take over-the-counter or prescription medicines for pain, discomfort, or fever as directed by your health care provider.  Empty your bladder often. Avoid holding urine for long periods of time.  After a bowel movement, women should wipe from front to back. Use each tissue only once.  Empty your bladder before and after sexual intercourse. SEEK MEDICAL CARE IF:   You develop back pain.  You develop nausea or vomiting.  Your symptoms are not better after 3 days. SEEK IMMEDIATE MEDICAL CARE IF:   You feel lightheaded or develop shortness of breath.  You develop chills or a fever.  You are getting worse, not better. MAKE SURE YOU:  Understand these instructions.  Will watch your condition.  Will get help right away if you are not doing well or get worse. Document Released: 04/19/2005 Document Revised: 02/07/2013 Document Reviewed: 12/25/2012 [facility] Patient Information 2014 [place].

## 2013-08-27 NOTE — Progress Notes (Signed)
Patient discharged home, all discharge medications and instructions reviewed with patient and niece.  Patient to be assisted to vehicle by wheelchair.

## 2013-08-27 NOTE — Evaluation (Signed)
Physical Therapy Evaluation Patient Details Name: Misty Blackwell MRN: [id number] DOB: 03/19/1928 Today's Date: 08/27/2013   History of Present Illness  CHRISTY EHRSAM is an 80 y.o. female with a PMH of CAD, HTN and atrial fibrillation on chronic Coumadin who presents with a 3 day history of upper respiratory symptoms including fever/chills, cough, body aches, pharyngitis, weakness/lethargy.    Clinical Impression  Pt  Mobilizing without AD. Pt declines need for HHPT. Niece present and vouches that pt will have 24 /7 caregivers.no further PT     Follow Up Recommendations No PT follow up    Equipment Recommendations  None recommended by PT    Recommendations for Other Services       Precautions / Restrictions Precautions Precautions: Fall Restrictions Weight Bearing Restrictions: No      Mobility  Bed Mobility                  Transfers Overall transfer level: Needs assistance Equipment used: None Transfers: Sit to/from Stand Sit to Stand: Supervision            Ambulation/Gait Ambulation/Gait assistance: Supervision Ambulation Distance (Feet): 250 Feet Assistive device: None Gait Pattern/deviations: Step-through pattern   Gait velocity interpretation: Below normal speed for age/gender General Gait Details: at times when turning head mild balance loss. Pt catches self.   Stairs            Wheelchair Mobility    Modified Rankin (Stroke Patients Only)       Balance Overall balance assessment: Needs assistance Sitting-balance support: No upper extremity supported;Feet supported Sitting balance-Leahy Scale: Good     Standing balance support: No upper extremity supported Standing balance-Leahy Scale: Fair                               Pertinent Vitals/Pain 94 %    Home Living Family/patient expects to be discharged to:: Private residence Living Arrangements: Other relatives;Non-relatives/Friends Available Help at Discharge:  Available 24 hours/day Type of Home: House Home Access: Stairs to enter Entrance Stairs-Rails: None Entrance Stairs-Number of Steps: 2 Home Layout: Two level;Able to live on main level with bedroom/bathroom Home Equipment: Gilford Rile - 4 wheels      Prior Function Level of Independence: Independent               Hand Dominance        Extremity/Trunk Assessment   Upper Extremity Assessment: Overall WFL for tasks assessed           Lower Extremity Assessment: LLE deficits/detail;RLE deficits/detail         Communication   Communication: No difficulties  Cognition Arousal/Alertness: Awake/alert Behavior During Therapy: WFL for tasks assessed/performed Overall Cognitive Status: Within Functional Limits for tasks assessed                      General Comments      Exercises        Assessment/Plan    PT Assessment Patent does not need any further PT services  PT Diagnosis     PT Problem List    PT Treatment Interventions     PT Goals (Current goals can be found in the Care Plan section) Acute Rehab PT Goals Patient Stated Goal: I want to go home. PT Goal Formulation: No goals set, d/c therapy    Frequency     Barriers to discharge  Co-evaluation               End of Session Equipment Utilized During Treatment: Gait belt Activity Tolerance: Patient tolerated treatment well Patient left: in chair;with call bell/phone within reach;with family/visitor present Nurse Communication: Mobility status         Time: 0626-9485 PT Time Calculation (min): 15 min   Charges:   PT Evaluation $Initial PT Evaluation Tier I: 1 Procedure PT Treatments $Gait Training: 8-22 mins   PT G Codes:          Claretha Cooper 08/27/2013, 10:55 AM Tresa Endo PT [phone]

## 2013-08-27 NOTE — ED Provider Notes (Signed)
Medical screening examination/treatment/procedure(s) were conducted as a shared visit with non-physician practitioner(s) and myself.  I personally evaluated the patient during the encounter.   EKG Interpretation None      She presented to the ER for evaluation of generalized weakness, shortness of breath and hypoxia. She was febrile on arrival. Chest x-ray did not show evidence of pneumonia, but pneumonia still suspected based on her clinical presentation. Patient will therefore warrant admission to the hospital for further workup.  Orpah Greek, MD 08/27/13 [phone]

## 2013-08-27 NOTE — Discharge Summary (Addendum)
Physician Discharge Summary  Misty Blackwell WIO:[id number] DOB: [birth year] DOA: 08/24/2013  PCP: Alesia Richards, MD  Admit date: 08/24/2013 Discharge date: 08/27/2013   Recommendations for Outpatient Follow-Up:   1. Recommend hospital followup in one week with consideration for repeat chest x-ray to ensure resolution of pneumonia. 2. Followup final blood culture results, negative to date.   Discharge Diagnosis:   Principal Problem:    Sepsis secondary to CAP Active Problems:    Hypertension    Atrial fibrillation    Pacemaker    CAP (community acquired pneumonia)   Discharge Condition: Improved.  Diet recommendation: Low sodium, heart healthy.    History of Present Illness:   Misty Blackwell is an 80 y.o. female with a PMH of CAD, HTN and atrial fibrillation on chronic Coumadin who was admitted 08/24/13 with a 3 day history of upper respiratory symptoms including fever/chills, cough, body aches, pharyngitis, weakness/lethargy, found to be septic with a rectal temperature of 103.1, WBC of 16.6, and infiltrates on chest radiography consistent with community-acquired pneumonia.  Hospital Course by Problem:   Principal Problem:  Sepsis secondary to CAP/E Coli UTI  Chest x-ray consistent with community-acquired pneumonia. Urine cultures positive for Escherichia coli. No elevation of serum lactate.  Strep pneumonia and Legionella antigens negative. Influenza panel negative.  Followup blood cultures.  Treated with empiric Rocephin and azithromycin, with gradual improvement in her condition. Discharge home on an additional 2 days of treatment with oral azithromycin and an additional 5 days of oral doxycycline. Active Problems:  Hypokalemia  Resolved with potassium added to IV fluids. Hypertension  Treated with Coreg and diltiazem, but Lasix was held secondary to soft blood pressures on admission. We'll resume Lasix today. Atrial fibrillation / pacemaker  Continue  Coumadin.  Has pacemaker so rate is controlled.  Continue Coreg and diltiazem.  Procedures:    None.   Medical Consultants:    None.   Discharge Exam:   Filed Vitals:   08/27/13 0639  BP: 142/72  Pulse: 82  Temp: 98.4 F (36.9 C)  Resp: 18   Filed Vitals:   08/26/13 0539 08/26/13 1412 08/26/13 2200 08/27/13 0639  BP: 152/85 134/70 162/93 142/72  Pulse: 85 67 87 82  Temp: 98.2 F (36.8 C) 98.3 F (36.8 C) 99 F (37.2 C) 98.4 F (36.9 C)  TempSrc: Oral Oral Oral Oral  Resp: 18 18 18 18   Height:      Weight:      SpO2: 92% 96% 91% 93%    Gen:  NAD Cardiovascular: HSIR, 2/6 murmur Respiratory: Lungs with a few high pitched wheezes Gastrointestinal: Abdomen soft, NT/ND with normal active bowel sounds. Extremities: No C/E/C   Discharge Instructions:   Discharge Orders   Future Appointments Provider Department Dept Phone   09/10/2013 10:20 AM Tommy Medal, RPH-CPP [facility] [phone]   10/17/2013 8:55 AM [facility] [place] [facility] [phone]   12/17/2013 9:30 AM Vicie Mutters, PA-C [place] [facility] [phone]   03/19/2014 9:30 AM [name], MD [place] [facility] [phone]   08/16/2014 10:00 AM [name], MD  [facility] [phone]   Future Orders Complete By Expires   Call MD for:  difficulty breathing, headache or visual disturbances  As directed    Call MD for:  extreme fatigue  As directed    Call MD for:  temperature >100.4  As directed    Diet - low sodium heart healthy  As directed    Discharge instructions  As directed    Increase activity slowly  As directed        Medication List         albuterol 108 (90 BASE) MCG/ACT inhaler  Commonly known as:  PROVENTIL HFA;VENTOLIN HFA  Inhale 2 puffs into the lungs every 6 (six) hours as needed for wheezing or shortness of breath.      ALPRAZolam 1 MG tablet  Commonly known as:  XANAX  Take 0.5 mg by mouth at bedtime.     aspirin EC 81 MG tablet  Take 81 mg by mouth daily.     azithromycin 250 MG tablet  Commonly known as:  ZITHROMAX  Take 1 tablet  for 2 more days.     carvedilol 12.5 MG tablet  Commonly known as:  COREG  Take 12.5 mg by mouth daily.     cholecalciferol 1000 UNITS tablet  Commonly known as:  VITAMIN D  Take 1,000 Units by mouth 3 (three) times daily.     diltiazem 360 MG 24 hr capsule  Commonly known as:  TIAZAC  Take 1 capsule (360 mg total) by mouth daily.     doxycycline 50 MG capsule  Commonly known as:  VIBRAMYCIN  Take 1 capsule (50 mg total) by mouth 2 (two) times daily.     furosemide 40 MG tablet  Commonly known as:  LASIX  Take 20 mg by mouth daily.     gabapentin 300 MG capsule  Commonly known as:  NEURONTIN  Take 300 mg by mouth 2 (two) times daily.     glucosamine-chondroitin 500-400 MG tablet  Take 1 tablet by mouth daily.     Magnesium 250 MG Tabs  Take 1 tablet by mouth daily.     potassium chloride SA 20 MEQ tablet  Commonly known as:  K-DUR,KLOR-CON  take 1 tablet by mouth once daily     pyridOXINE 50 MG tablet  Commonly known as:  VITAMIN B-6  Take 50 mg by mouth daily.     TAVIST SINUS NON-DROWSY MAX ST PO  Take 1 tablet by mouth once.     warfarin 3 MG tablet  Commonly known as:  COUMADIN  Take 3-4.5 mg by mouth See admin instructions. Takes 3mg  everyday except Monday and Friday's takes 4.5mg            Follow-up Information   Follow up with MCKEOWN,WILLIAM DAVID, MD. Schedule an appointment as soon as possible for a visit in 1 week. [facility] followup.)    Specialty:  Internal Medicine   Contact information:   [address] [place] [address] [phone]        The results of significant diagnostics from this hospitalization (including imaging, microbiology, ancillary and laboratory) are listed below for reference.       Significant Diagnostic Studies:   Radiographs: Dg Chest 2 View  08/24/2013   CLINICAL DATA:  cough  EXAM: CHEST  2 VIEW  COMPARISON:  DG CHEST 2 VIEW dated 06/04/2011  FINDINGS: Low lung volumes. Cardiac silhouette is enlarged. A left chest wall dual chamber cardiac pacer. Areas of increased density within the lung bases left greater than right. There is prominence of interstitial markings and peribronchial cuffing. The osseous structures unremarkable.  IMPRESSION: Mild pulmonary edema.  Areas of atelectasis or scarring within the lung bases versus infiltrates.   Electronically Signed   By: Margaree Mackintosh M.D.   On: 08/24/2013 15:09  Labs:  Basic Metabolic Panel:  Recent Labs Lab 08/24/13 1431 08/26/13 0511  NA 137 137  K 3.5* 3.8  CL 96 101  CO2 28 25  GLUCOSE 139* 96  BUN 14 14  CREATININE 0.73 0.62  CALCIUM 9.4 8.6   GFR Estimated Creatinine Clearance: 49.3 ml/min (by C-G formula based on Cr of 0.62). Liver Function Tests:  Recent Labs Lab 08/24/13 1431  AST 17  ALT 13  ALKPHOS 177*  BILITOT 1.6*  PROT 7.8  ALBUMIN 3.9   Coagulation profile  Recent Labs Lab 08/24/13 1430 08/25/13 0435 08/26/13 0511 08/27/13 0400  INR 1.50* 1.79* 1.93* 2.28*    CBC:  Recent Labs Lab 08/24/13 1431 08/26/13 0511  WBC 16.6* 13.5*  NEUTROABS 12.9*  --   HGB 13.8 12.0  HCT 42.3 37.5  MCV 91.4 94.0  PLT 201 190   Microbiology Recent Results (from the past 240 hour(s))  URINE CULTURE     Status: None   Collection Time    08/24/13  3:09 PM      Result Value Ref Range Status   Specimen Description URINE, CLEAN CATCH   Final   Special Requests NONE   Final   Culture  Setup Time     Final   Value: 08/24/2013 22:39     Performed at [facility]     Final   Value: >=100,000 COLONIES/ML     Performed at [facility]   Culture     Final   Value: ESCHERICHIA COLI     Performed at [facility]   Report Status 08/26/2013 FINAL    Final   Organism ID, Bacteria ESCHERICHIA COLI   Final  CULTURE, BLOOD (ROUTINE X 2)     Status: None   Collection Time    08/24/13  3:25 PM      Result Value Ref Range Status   Specimen Description BLOOD BLOOD LEFT FOREARM   Final   Special Requests BOTTLES DRAWN AEROBIC AND ANAEROBIC 5CC   Final   Culture  Setup Time     Final   Value: 08/24/2013 22:08     Performed at [facility]   Culture     Final   Value:        BLOOD CULTURE RECEIVED NO GROWTH TO DATE CULTURE WILL BE HELD FOR 5 DAYS BEFORE ISSUING A FINAL NEGATIVE REPORT     Performed at [facility]   Report Status PENDING   Incomplete  CULTURE, BLOOD (ROUTINE X 2)     Status: None   Collection Time    08/24/13  3:34 PM      Result Value Ref Range Status   Specimen Description BLOOD BLOOD RIGHT FOREARM   Final   Special Requests BOTTLES DRAWN AEROBIC AND ANAEROBIC 5CC   Final   Culture  Setup Time     Final   Value: 08/24/2013 22:09     Performed at [facility]   Culture     Final   Value:        BLOOD CULTURE RECEIVED NO GROWTH TO DATE CULTURE WILL BE HELD FOR 5 DAYS BEFORE ISSUING A FINAL NEGATIVE REPORT     Performed at [facility]   Report Status PENDING   Incomplete    Time coordinating discharge: 35 minutes.  SignedVenetia Maxon Rama  Pager [phone] [facility] 08/27/2013, 10:05 AM

## 2013-08-30 LAB — CULTURE, BLOOD (ROUTINE X 2)
Culture: NO GROWTH
Culture: NO GROWTH

## 2013-09-03 ENCOUNTER — Encounter: Payer: Self-pay | Admitting: Internal Medicine

## 2013-09-03 ENCOUNTER — Ambulatory Visit (INDEPENDENT_AMBULATORY_CARE_PROVIDER_SITE_OTHER): Payer: Medicare Other | Admitting: Internal Medicine

## 2013-09-03 VITALS — BP 104/76 | HR 68 | Temp 97.6°F | Resp 16 | Ht 64.75 in | Wt 146.2 lb

## 2013-09-03 DIAGNOSIS — I10 Essential (primary) hypertension: Secondary | ICD-10-CM

## 2013-09-03 DIAGNOSIS — R5383 Other fatigue: Secondary | ICD-10-CM

## 2013-09-03 DIAGNOSIS — R5381 Other malaise: Secondary | ICD-10-CM

## 2013-09-03 DIAGNOSIS — Z79899 Other long term (current) drug therapy: Secondary | ICD-10-CM

## 2013-09-03 LAB — BASIC METABOLIC PANEL WITH GFR
BUN: 22 mg/dL (ref 6–23)
CO2: 30 meq/L (ref 19–32)
Calcium: 9.7 mg/dL (ref 8.4–10.5)
Chloride: 102 mEq/L (ref 96–112)
Creat: 0.79 mg/dL (ref 0.50–1.10)
GFR, Est African American: 79 mL/min
GFR, Est Non African American: 69 mL/min
GLUCOSE: 101 mg/dL — AB (ref 70–99)
POTASSIUM: 4.8 meq/L (ref 3.5–5.3)
SODIUM: 141 meq/L (ref 135–145)

## 2013-09-03 LAB — CBC WITH DIFFERENTIAL/PLATELET
Basophils Absolute: 0.1 10*3/uL (ref 0.0–0.1)
Basophils Relative: 1 % (ref 0–1)
Eosinophils Absolute: 0.2 10*3/uL (ref 0.0–0.7)
Eosinophils Relative: 2 % (ref 0–5)
HCT: 41.4 % (ref 36.0–46.0)
HEMOGLOBIN: 13.8 g/dL (ref 12.0–15.0)
LYMPHS ABS: 2.8 10*3/uL (ref 0.7–4.0)
Lymphocytes Relative: 27 % (ref 12–46)
MCH: 29.8 pg (ref 26.0–34.0)
MCHC: 33.3 g/dL (ref 30.0–36.0)
MCV: 89.4 fL (ref 78.0–100.0)
Monocytes Absolute: 0.8 10*3/uL (ref 0.1–1.0)
Monocytes Relative: 8 % (ref 3–12)
NEUTROS ABS: 6.4 10*3/uL (ref 1.7–7.7)
NEUTROS PCT: 62 % (ref 43–77)
Platelets: 415 10*3/uL — ABNORMAL HIGH (ref 150–400)
RBC: 4.63 MIL/uL (ref 3.87–5.11)
RDW: 13.6 % (ref 11.5–15.5)
WBC: 10.4 10*3/uL (ref 4.0–10.5)

## 2013-09-03 NOTE — Patient Instructions (Signed)

## 2013-09-03 NOTE — Progress Notes (Signed)
Subjective:    [name], female    DOB: 1927-10-08, 80 y.o.   MRN: [id number]  HPI Patient presents for F/U after in hospital 08/24/2013-08/27/2013 with AECB, UTI and Dehydration. Patient apparently promptly improved with IVF and was treated w/Rocephin an Zpak and discharged to complete Z pak ans also given Rx Doxycycline. Patient has been home about 7 days and feels her energy is improving every day. She still has a dry non-productive cough.    Medication List       This list is accurate as of: 09/03/13  6:28 PM.  Always use your most recent med list.               albuterol 108 (90 BASE) MCG/ACT inhaler  Commonly known as:  PROVENTIL HFA;VENTOLIN HFA  Inhale 2 puffs into the lungs every 6 (six) hours as needed for wheezing or shortness of breath.     ALPRAZolam 1 MG tablet  Commonly known as:  XANAX  Take 0.5 mg by mouth at bedtime.     aspirin EC 81 MG tablet  Take 81 mg by mouth daily.     carvedilol 12.5 MG tablet  Commonly known as:  COREG  Take 12.5 mg by mouth daily.     cholecalciferol 1000 UNITS tablet  Commonly known as:  VITAMIN D  Take 1,000 Units by mouth 3 (three) times daily.     diltiazem 360 MG 24 hr capsule  Commonly known as:  TIAZAC  Take 1 capsule (360 mg total) by mouth daily.     DILTIAZEM HCL CD 360 MG 24 hr capsule  Generic drug:  diltiazem     furosemide 40 MG tablet  Commonly known as:  LASIX  Take 20 mg by mouth daily.     gabapentin 300 MG capsule  Commonly known as:  NEURONTIN  Take 300 mg by mouth 2 (two) times daily.     glucosamine-chondroitin 500-400 MG tablet  Take 1 tablet by mouth daily.     Magnesium 250 MG Tabs  Take 1 tablet by mouth daily.     potassium chloride SA 20 MEQ tablet  Commonly known as:  K-DUR,KLOR-CON  take 1 tablet by mouth once daily     pyridOXINE 50 MG tablet  Commonly known as:  VITAMIN B-6  Take 50 mg by mouth daily.     TAVIST SINUS NON-DROWSY MAX ST PO  Take 1 tablet by  mouth once.     warfarin 3 MG tablet  Commonly known as:  COUMADIN  Take 3-4.5 mg by mouth See admin instructions. Takes 3mg  everyday except Monday and Friday's takes 4.5mg        Allergies  Allergen Reactions  . Ace Inhibitors     Unknown   . Acrylic Polymer [Carbomer]   . Augmentin [Amoxicillin-Pot Clavulanate]   . Chocolate Other (See Comments)    migraine's   . Ciprofloxacin   . Gabapentin Other (See Comments)    Makes her sleepy   . Levaquin [Levofloxacin In D5w]   . Zocor [Simvastatin]    Past Medical History  Diagnosis Date  . Coronary artery disease 03/19/2002    R/P Cardiolite - EF 76%; nromal static and dynamic myocardial perfusion images; normal wall motion and endocardial thickening in all vascular territories  . Hypertension   . Atrial fib/flutter, transient   . Skin cancer     s/p surgical removal.  . Pacemaker   . TIA (transient ischemic attack)   .  CHF (congestive heart failure) 10/29/2009    Echo - EF >55%; normal LV size and systolic function; unable to assess diastolic fcn due to E/A fusion, pulmonary vein flow pattern suggests elevated filling pressure; marked biatrail dilation, mild/mod tricuspid regurgitation; mod pulmonary htn; mild/mod mitral regurgitation; although echocardiographic features are incomplete findings suggest possible infiltrative cardiomyopathy (maybe amyloidosi  . Facial numbness 12/26/2008    carotid doppler - R and L ICAs 0-49% diameter reduction (velocities suggest low end of scale)  . Atrial fibrillation, chronic   . Peripheral neuropathy    Review of Systems In addition to the HPI above,  No Fever-chills,  No Headache, No changes with Vision or hearing,  No problems swallowing food or Liquids,  No Chest pain or productive Cough or Shortness of Breath,  No Abdominal pain, No Nausea or Vommitting, Bowel movements are regular,  No Blood in stool or Urine,  No dysuria,  No new skin rashes or bruises,  No new joints pains-aches,   No new weakness, tingling, numbness in any extremity,  No recent weight loss,  No polyuria, polydypsia or polyphagia,  No significant Mental Stressors.  A full 10 point Review of Systems was done, except as stated above, all other Review of Systems were negative  Objective:   Physical Exam  BP 104/76  Pulse 68  Temp(Src) 97.6 F (36.4 C) (Temporal)  Resp 16  Ht 5' 4.75" (1.645 m)  Wt 146 lb 3.2 oz (66.316 kg)  BMI 24.51 kg/m2  HEENT - Eac's patent. TM's Nl.EOM's full. PERRLA. NasoOroPharynx clear. Neck - supple. Nl Thyroid. No bruits nodes JVD Chest - Clear equal BS Cor - Nl HS. RRR w/o sig MGR. PP 1(+) No edema. Abd - No palpable organomegaly, masses or tenderness. BS nl. MS- FROM. w/o deformities. Muscle power tone and bulk Nl. Gait Nl. Neuro - No obvious Cr N abnormalities. Sensory, motor and Cerebellar functions appear Nl w/o focal abnormalities.  Assessment & Plan:   1- HTN, controlled  2- Malaise & Fatigue  Check CBC & BMET

## 2013-09-10 ENCOUNTER — Ambulatory Visit (INDEPENDENT_AMBULATORY_CARE_PROVIDER_SITE_OTHER): Payer: Medicare Other | Admitting: Pharmacist Clinician (PhC)/ Clinical Pharmacy Specialist

## 2013-09-10 DIAGNOSIS — Z7901 Long term (current) use of anticoagulants: Secondary | ICD-10-CM

## 2013-09-10 DIAGNOSIS — I4891 Unspecified atrial fibrillation: Secondary | ICD-10-CM

## 2013-09-10 LAB — POCT INR: INR: 2.3

## 2013-10-08 ENCOUNTER — Other Ambulatory Visit: Payer: Self-pay | Admitting: Internal Medicine

## 2013-10-08 ENCOUNTER — Ambulatory Visit (INDEPENDENT_AMBULATORY_CARE_PROVIDER_SITE_OTHER): Payer: Medicare Other | Admitting: Pharmacist Clinician (PhC)/ Clinical Pharmacy Specialist

## 2013-10-08 DIAGNOSIS — I4891 Unspecified atrial fibrillation: Secondary | ICD-10-CM

## 2013-10-08 DIAGNOSIS — Z7901 Long term (current) use of anticoagulants: Secondary | ICD-10-CM

## 2013-10-08 LAB — POCT INR: INR: 2.4

## 2013-10-17 ENCOUNTER — Ambulatory Visit (INDEPENDENT_AMBULATORY_CARE_PROVIDER_SITE_OTHER): Payer: Medicare Other | Admitting: *Deleted

## 2013-10-17 DIAGNOSIS — I4891 Unspecified atrial fibrillation: Secondary | ICD-10-CM

## 2013-10-17 DIAGNOSIS — Z950 Presence of cardiac pacemaker: Secondary | ICD-10-CM

## 2013-10-17 LAB — MDC_IDC_ENUM_SESS_TYPE_REMOTE
Battery Remaining Longevity: 96 mo
Brady Statistic RV Percent Paced: 82.1 %
Lead Channel Impedance Value: 670 Ohm
Lead Channel Pacing Threshold Pulse Width: 0.4 ms
Lead Channel Setting Pacing Amplitude: 2.5 V
Lead Channel Setting Pacing Pulse Width: 0.4 ms
MDC IDC MSMT LEADCHNL RV PACING THRESHOLD AMPLITUDE: 0.5 V
MDC IDC SET LEADCHNL RV SENSING SENSITIVITY: 4 mV

## 2013-10-17 NOTE — Progress Notes (Signed)
Remote pacemaker transmission.   

## 2013-10-23 ENCOUNTER — Encounter: Payer: Self-pay | Admitting: Cardiology

## 2013-10-25 ENCOUNTER — Encounter: Payer: Self-pay | Admitting: Internal Medicine

## 2013-11-19 ENCOUNTER — Ambulatory Visit (INDEPENDENT_AMBULATORY_CARE_PROVIDER_SITE_OTHER): Payer: Medicare Other | Admitting: Pharmacist Clinician (PhC)/ Clinical Pharmacy Specialist

## 2013-11-19 DIAGNOSIS — Z7901 Long term (current) use of anticoagulants: Secondary | ICD-10-CM

## 2013-11-19 DIAGNOSIS — I4891 Unspecified atrial fibrillation: Secondary | ICD-10-CM

## 2013-11-19 LAB — POCT INR: INR: 2.1

## 2013-12-17 ENCOUNTER — Ambulatory Visit (INDEPENDENT_AMBULATORY_CARE_PROVIDER_SITE_OTHER): Payer: Medicare Other | Admitting: Physician Assistant

## 2013-12-17 ENCOUNTER — Ambulatory Visit (HOSPITAL_COMMUNITY)
Admission: RE | Admit: 2013-12-17 | Discharge: 2013-12-17 | Disposition: A | Discharge status: Home / Self Care | Payer: Medicare Other | Source: Ambulatory Visit | Attending: Physician Assistant | Admitting: Physician Assistant

## 2013-12-17 ENCOUNTER — Encounter: Payer: Self-pay | Admitting: Physician Assistant

## 2013-12-17 VITALS — BP 114/68 | HR 86 | Temp 98.0°F | Resp 16 | Ht 64.0 in | Wt 152.0 lb

## 2013-12-17 DIAGNOSIS — E559 Vitamin D deficiency, unspecified: Secondary | ICD-10-CM

## 2013-12-17 DIAGNOSIS — Z950 Presence of cardiac pacemaker: Secondary | ICD-10-CM | POA: Insufficient documentation

## 2013-12-17 DIAGNOSIS — R5381 Other malaise: Secondary | ICD-10-CM | POA: Insufficient documentation

## 2013-12-17 DIAGNOSIS — I10 Essential (primary) hypertension: Secondary | ICD-10-CM

## 2013-12-17 DIAGNOSIS — I482 Chronic atrial fibrillation, unspecified: Secondary | ICD-10-CM

## 2013-12-17 DIAGNOSIS — Z79899 Other long term (current) drug therapy: Secondary | ICD-10-CM

## 2013-12-17 DIAGNOSIS — R05 Cough: Secondary | ICD-10-CM

## 2013-12-17 DIAGNOSIS — I517 Cardiomegaly: Secondary | ICD-10-CM | POA: Diagnosis not present

## 2013-12-17 DIAGNOSIS — R059 Cough, unspecified: Secondary | ICD-10-CM

## 2013-12-17 DIAGNOSIS — E782 Mixed hyperlipidemia: Secondary | ICD-10-CM

## 2013-12-17 DIAGNOSIS — Z9181 History of falling: Secondary | ICD-10-CM

## 2013-12-17 DIAGNOSIS — Z0000 Encounter for general adult medical examination without abnormal findings: Secondary | ICD-10-CM

## 2013-12-17 DIAGNOSIS — F329 Major depressive disorder, single episode, unspecified: Secondary | ICD-10-CM

## 2013-12-17 DIAGNOSIS — F32A Depression, unspecified: Secondary | ICD-10-CM

## 2013-12-17 DIAGNOSIS — R5383 Other fatigue: Secondary | ICD-10-CM

## 2013-12-17 DIAGNOSIS — R7303 Prediabetes: Secondary | ICD-10-CM

## 2013-12-17 LAB — CBC WITH DIFFERENTIAL/PLATELET
Basophils Absolute: 0.1 10*3/uL (ref 0.0–0.1)
Basophils Relative: 1 % (ref 0–1)
EOS ABS: 0.2 10*3/uL (ref 0.0–0.7)
Eosinophils Relative: 2 % (ref 0–5)
HCT: 42.6 % (ref 36.0–46.0)
Hemoglobin: 14.7 g/dL (ref 12.0–15.0)
LYMPHS ABS: 2.5 10*3/uL (ref 0.7–4.0)
Lymphocytes Relative: 29 % (ref 12–46)
MCH: 30.2 pg (ref 26.0–34.0)
MCHC: 34.5 g/dL (ref 30.0–36.0)
MCV: 87.5 fL (ref 78.0–100.0)
MONOS PCT: 10 % (ref 3–12)
Monocytes Absolute: 0.9 10*3/uL (ref 0.1–1.0)
NEUTROS PCT: 58 % (ref 43–77)
Neutro Abs: 4.9 10*3/uL (ref 1.7–7.7)
PLATELETS: 254 10*3/uL (ref 150–400)
RBC: 4.87 MIL/uL (ref 3.87–5.11)
RDW: 13.6 % (ref 11.5–15.5)
WBC: 8.5 10*3/uL (ref 4.0–10.5)

## 2013-12-17 LAB — HEMOGLOBIN A1C
HEMOGLOBIN A1C: 6.1 % — AB (ref <5.7)
Mean Plasma Glucose: 128 mg/dL — ABNORMAL HIGH (ref <117)

## 2013-12-17 MED ORDER — NYSTATIN 100000 UNIT/ML MT SUSP: OROMUCOSAL | Status: DC

## 2013-12-17 NOTE — Progress Notes (Signed)
MEDICARE ANNUAL WELLNESS VISIT AND FOLLOW UP  Assessment:   1. Hypertension - CBC with Differential - BASIC METABOLIC PANEL WITH GFR - Hepatic function panel - TSH - DG Chest 2 View; Future  2. Prediabetes Discussed general issues about diabetes pathophysiology and management., Educational material distributed., Suggested low cholesterol diet., Encouraged aerobic exercise., Discussed foot care., Reminded to get yearly retinal exam. - Hemoglobin A1c - Insulin, fasting  3. Encounter for long-term (current) use of other medications - Magnesium  4. Hyperlipidemia - Lipid panel  5. Pacemaker Continue follow up cardio  6. Vitamin D Deficiency - Vit D  25 hydroxy (rtn osteoporosis monitoring)  7. Chronic atrial fibrillation Continue follow up cardio  8. Cough/Hoarseness since CAP ? Yeast versus inflammation- - get CXR, given nystatin mouth wash, if not better will refer ENT - DG Chest 2 View; Future  9. Depression -+ depression due to recent passing of her husband, does not want medications, can take xanax 1/4 during the day if needed, and patient suggested to go to counseling.   Plan:   During the course of the visit the patient was educated and counseled about appropriate screening and preventive services including:    Pneumococcal vaccine   Influenza vaccine  Td vaccine  Screening electrocardiogram  Screening mammography  Bone densitometry screening  Colorectal cancer screening  Diabetes screening  Glaucoma screening  Nutrition counseling   Advanced directives: given info/requested  Screening recommendations, referrals:  Vaccinations: Tdap vaccine ordered Influenza vaccine requested Pneumococcal vaccine would benefit from prevnar 13 with history of CAP would prefer to wait Shingles vaccine discussed Hep B vaccine not indicated  Nutrition assessed and recommended  Colonoscopy up to date Mammogram requested Pap smear not indicated Pelvic exam  not indicated Recommended yearly ophthalmology/optometry visit for glaucoma screening and checkup Recommended yearly dental visit for hygiene and checkup Advanced directives - requested  Conditions/risks identified: BMI: Discussed weight loss, diet, and increase physical activity.  Increase physical activity: AHA recommends 150 minutes of physical activity a week.  Medications reviewed DEXA- requested Diabetes is at goal, ACE/ARB therapy: Yes. Urinary Incontinence is an issue: discussed non pharmacology and pharmacology options. She will continue vesicare and follow up with Dr. Risa Grill.   Fall risk: moderate- discussed PT, home fall assessment, medications.    Subjective:   Misty Blackwell is a 80 y.o. female who presents for Medicare Annual Wellness Visit and 3 month follow up on hypertension, prediabetes, hyperlipidemia, vitamin D def.  Date of last medicare wellness visit is unknown.   Her blood pressure has been controlled at home, today their BP is BP: 114/68 mmHg She does not workout. She denies chest pain, shortness of breath, dizziness.  She is not on cholesterol medication and denies myalgias. Her cholesterol is at goal. The cholesterol last visit was:   Lab Results  Component Value Date   CHOL 163 08/15/2013   HDL 40 08/15/2013   LDLCALC 85 08/15/2013   TRIG 192* 08/15/2013   CHOLHDL 4.1 08/15/2013   She has been working on diet and exercise for prediabetes, She has neuropathy in bilateral feet and denies paresthesia of the feet, polydipsia and polyuria. Last A1C in the office was:  Lab Results  Component Value Date   HGBA1C 5.8* 08/15/2013   Patient is on Vitamin D supplement. Lab Results  Component Value Date   VD25OH 55 08/15/2013     She follows Dr. Sallyanne Kuster for ASHD, last cath 2007 and normal cardiolite 2003, afib with slow RVR with  pacemaker insertion in 2007. She follows at the coumadin clinic.  She lost her husband in March, she was married 34 years, was admitted  for CAP in the hospital in May, she states since then she has been having hoarsness, dry cough, and feeling fatigue, depression.   Names of Other Physician/Practitioners you currently use: 1. [facility]- here for primary care 2. Dr. Herbert Deaner, eye doctor, last visit 2 years, has appointment Patient Care Team: [name], MD as PCP - General (Internal Medicine) Bernestine Amass, MD as Consulting Physician (Urology) Penni Bombard, MD as Consulting Physician (Neurology) Antony Contras, MD as Consulting Physician (Neurology) Sanda Klein, MD as Consulting Physician (Cardiology) [name], MD as Consulting Physician (Gastroenterology)  Medication Review Current Outpatient Prescriptions on File Prior to Visit  Medication Sig Dispense Refill  . ALPRAZolam (XANAX) 1 MG tablet Take 0.5 mg by mouth at bedtime.      Marland Kitchen aspirin EC 81 MG tablet Take 81 mg by mouth daily.      . carvedilol (COREG) 12.5 MG tablet TAKE 1/2 TO 1 TABLET BY MOUTH TWICE DAILY FOR BLOOD PRESSURE AND HEART  60 tablet  12  . cholecalciferol (VITAMIN D) 1000 UNITS tablet Take 1,000 Units by mouth 3 (three) times daily.       Marland Kitchen diltiazem (TIAZAC) 360 MG 24 hr capsule Take 1 capsule (360 mg total) by mouth daily.  30 capsule  6  . furosemide (LASIX) 40 MG tablet Take 20 mg by mouth daily.      Marland Kitchen gabapentin (NEURONTIN) 300 MG capsule Take 300 mg by mouth 2 (two) times daily.      Marland Kitchen glucosamine-chondroitin 500-400 MG tablet Take 1 tablet by mouth daily.      . Magnesium 250 MG TABS Take 1 tablet by mouth daily.      . potassium chloride SA (K-DUR,KLOR-CON) 20 MEQ tablet take 1 tablet by mouth once daily  30 tablet  4  . pyridOXINE (VITAMIN B-6) 50 MG tablet Take 50 mg by mouth daily.      . VESICARE 5 MG tablet Take 5 mg by mouth daily.      Marland Kitchen warfarin (COUMADIN) 3 MG tablet Take 3-4.5 mg by mouth See admin instructions. Takes 3mg  everyday except Monday and Friday's takes 4.5mg         No current facility-administered medications on file prior to visit.    Current Problems (verified) Patient Active Problem List   Diagnosis Date Noted  . CAP (community acquired pneumonia) 08/24/2013  . Prediabetes 08/15/2013  . Vitamin D Deficiency 08/15/2013  . Encounter for long-term (current) use of other medications 08/15/2013  . Pacemaker 09/19/2012  . Atrial fibrillation 07/18/2012  . Long term (current) use of anticoagulants 07/18/2012  . Hyperlipidemia 09/03/2008  . Hypertension 09/03/2008  . ASCAD 09/03/2008  . GERD 09/03/2008  . FIBROCYSTIC BREAST DISEASE 09/03/2008  . DJD 09/03/2008    Screening Tests Health Maintenance  Topic Date Due  . Colonoscopy  08/21/1979  . Zostavax  08/20/1989  . Tetanus/tdap  05/03/2013  . Influenza Vaccine  12/01/2013  . Pneumococcal Polysaccharide Vaccine Age 57 And Over  Completed     Immunization History  Administered Date(s) Administered  . Influenza Split 05/04/2011  . Influenza,inj,quad, With Preservative 05/21/2013  . Pneumococcal Polysaccharide-23 05/04/2011  . Pneumococcal-Unspecified 05/03/2001  . Td 05/04/2003    Preventative care: Last colonoscopy: 2010 due 2020 Last mammogram: DUE Last pap smear/pelvic exam: remote  DEXA:DUE  Prior  vaccinations: TD or Tdap: 2005 due- declines right now  Influenza: 2015  Pneumococcal: 2012 Shingles/Zostavax: declines  History reviewed: allergies, current medications, past family history, past medical history, past social history, past surgical history and problem list  Risk Factors: Osteoporosis: postmenopausal estrogen deficiency and dietary calcium and/or vitamin D deficiency History of fracture in the past year: no  Tobacco History  Substance Use Topics  . Smoking status: Never Smoker   . Smokeless tobacco: Never Used  . Alcohol Use: No   She does not smoke.  Patient is not a former smoker. Are there smokers in your home (other than you)?   No  Alcohol Current alcohol use: none  Caffeine Current caffeine use: coffee 1 /day  Exercise Current exercise: none  Nutrition/Diet Current diet: in general, a "healthy" diet    Cardiac risk factors: advanced age (older than 18 for men, 68 for women), dyslipidemia, family history of premature cardiovascular disease, hypertension and sedentary lifestyle.  Depression Screen- due to husband recently passing (Note: if answer to either of the following is "Yes", a more complete depression screening is indicated)   Q1: Over the past two weeks, have you felt down, depressed or hopeless? Yes  Q2: Over the past two weeks, have you felt little interest or pleasure in doing things? Yes  Have you lost interest or pleasure in daily life? Yes  Do you often feel hopeless? No  Do you cry easily over simple problems? Yes  Activities of Daily Living In your present state of health, do you have any difficulty performing the following activities?:  Driving? No Managing money?  No Feeding yourself? No Getting from bed to chair? No Climbing a flight of stairs? No Preparing food and eating?: No Bathing or showering? No Getting dressed: No Getting to the toilet? No Using the toilet:No Moving around from place to place: No In the past year have you fallen or had a near fall?:No   Are you sexually active?  No  Do you have more than one partner?  No  Vision Difficulties: Yes  Hearing Difficulties: Yes Do you often ask people to speak up or repeat themselves? Yes Do you experience ringing or noises in your ears? No Do you have difficulty understanding soft or whispered voices? Yes  Cognition  Do you feel that you have a problem with memory?Yes  Do you often misplace items? No  Do you feel safe at home?  Yes  Advanced directives Does patient have a [place]? Yes Does patient have a Living Will? Yes   Objective:   Blood pressure 114/68, pulse 86, temperature 98  F (36.7 C), temperature source Temporal, resp. rate 16, height 5\' 4"  (1.626 m), weight 152 lb (68.947 kg). Body mass index is 26.08 kg/(m^2).  General appearance: alert, no distress, WD/WN,  female Cognitive Testing  Alert? Yes  Normal Appearance?Yes  Oriented to person? Yes  Place? Yes   Time? Yes  Recall of three objects?  Yes  Can perform simple calculations? Yes  Displays appropriate judgment?Yes  Can read the correct time from a watch face?Yes  HEENT: normocephalic, sclerae anicteric, TMs pearly, nares patent, no discharge or erythema, pharynx normal Oral cavity: MMM, no lesions Neck: supple, no lymphadenopathy, no thyromegaly, no masses Heart: RRR, normal S1, S2, no murmurs Lungs: CTA bilaterally, no wheezes, rhonchi, or rales Abdomen: +bs, soft, non tender, non distended, no masses, no hepatomegaly, no splenomegaly Musculoskeletal: nontender, no swelling, no obvious deformity Extremities: no edema, no  cyanosis, no clubbing Pulses: 2+ symmetric, upper and lower extremities, normal cap refill Neurological: alert, oriented x 3, CN2-12 intact, strength normal upper extremities and lower extremities, sensation normal throughout, DTRs 2+ throughout, no cerebellar signs, gait normal Psychiatric: normal affect, behavior normal, pleasant  Breast: defer Gyn: defer Rectal: defer  Medicare Attestation I have personally reviewed: The patient's medical and social history Their use of alcohol, tobacco or illicit drugs Their current medications and supplements The patient's functional ability including ADLs,fall risks, home safety risks, cognitive, and hearing and visual impairment Diet and physical activities Evidence for depression or mood disorders  The patient's weight, height, BMI, and visual acuity have been recorded in the chart.  I have made referrals, counseling, and provided education to the patient based on review of the above and I have provided the patient with a written  personalized care plan for preventive services.     Vicie Mutters, PA-C   12/17/2013

## 2013-12-17 NOTE — Patient Instructions (Signed)

## 2013-12-18 LAB — BASIC METABOLIC PANEL WITH GFR
BUN: 15 mg/dL (ref 6–23)
CALCIUM: 9.3 mg/dL (ref 8.4–10.5)
CO2: 27 meq/L (ref 19–32)
Chloride: 100 mEq/L (ref 96–112)
Creat: 0.79 mg/dL (ref 0.50–1.10)
GFR, Est African American: 79 mL/min
GFR, Est Non African American: 69 mL/min
Glucose, Bld: 94 mg/dL (ref 70–99)
POTASSIUM: 4.4 meq/L (ref 3.5–5.3)
SODIUM: 140 meq/L (ref 135–145)

## 2013-12-18 LAB — INSULIN, FASTING: Insulin fasting, serum: 15 u[IU]/mL (ref 3–28)

## 2013-12-18 LAB — LIPID PANEL
CHOL/HDL RATIO: 4.4 ratio
Cholesterol: 162 mg/dL (ref 0–200)
HDL: 37 mg/dL — ABNORMAL LOW (ref >39)
LDL CALC: 102 mg/dL — AB (ref 0–99)
TRIGLYCERIDES: 114 mg/dL (ref <150)
VLDL: 23 mg/dL (ref 0–40)

## 2013-12-18 LAB — HEPATIC FUNCTION PANEL
ALT: 13 U/L (ref 0–35)
AST: 18 U/L (ref 0–37)
Albumin: 4.5 g/dL (ref 3.5–5.2)
Alkaline Phosphatase: 160 U/L — ABNORMAL HIGH (ref 39–117)
BILIRUBIN DIRECT: 0.2 mg/dL (ref 0.0–0.3)
BILIRUBIN INDIRECT: 0.5 mg/dL (ref 0.2–1.2)
BILIRUBIN TOTAL: 0.7 mg/dL (ref 0.2–1.2)
Total Protein: 7.1 g/dL (ref 6.0–8.3)

## 2013-12-18 LAB — TSH: TSH: 1.506 u[IU]/mL (ref 0.350–4.500)

## 2013-12-18 LAB — VITAMIN D 25 HYDROXY (VIT D DEFICIENCY, FRACTURES): VIT D 25 HYDROXY: 57 ng/mL (ref 30–89)

## 2013-12-18 LAB — MAGNESIUM: Magnesium: 2.1 mg/dL (ref 1.5–2.5)

## 2013-12-31 ENCOUNTER — Ambulatory Visit (INDEPENDENT_AMBULATORY_CARE_PROVIDER_SITE_OTHER): Payer: Medicare Other | Admitting: Pharmacist Clinician (PhC)/ Clinical Pharmacy Specialist

## 2013-12-31 ENCOUNTER — Other Ambulatory Visit: Payer: Self-pay | Admitting: *Deleted

## 2013-12-31 DIAGNOSIS — I4891 Unspecified atrial fibrillation: Secondary | ICD-10-CM

## 2013-12-31 DIAGNOSIS — Z7901 Long term (current) use of anticoagulants: Secondary | ICD-10-CM

## 2013-12-31 LAB — POCT INR: INR: 2.1

## 2013-12-31 MED ORDER — GABAPENTIN 300 MG PO CAPS: 300.0000 mg | ORAL_CAPSULE | Freq: Two times a day (BID) | ORAL | Status: DC

## 2014-01-16 ENCOUNTER — Encounter: Payer: Self-pay | Admitting: Gastroenterology

## 2014-02-08 ENCOUNTER — Encounter: Payer: Self-pay | Admitting: *Deleted

## 2014-02-11 ENCOUNTER — Ambulatory Visit (INDEPENDENT_AMBULATORY_CARE_PROVIDER_SITE_OTHER): Payer: Medicare Other | Admitting: Pharmacist Clinician (PhC)/ Clinical Pharmacy Specialist

## 2014-02-11 ENCOUNTER — Other Ambulatory Visit: Payer: Self-pay | Admitting: Pharmacist Clinician (PhC)/ Clinical Pharmacy Specialist

## 2014-02-11 DIAGNOSIS — I4891 Unspecified atrial fibrillation: Secondary | ICD-10-CM

## 2014-02-11 DIAGNOSIS — Z7901 Long term (current) use of anticoagulants: Secondary | ICD-10-CM

## 2014-02-11 LAB — POCT INR: INR: 2.3

## 2014-02-25 ENCOUNTER — Other Ambulatory Visit: Payer: Self-pay | Admitting: Internal Medicine

## 2014-03-05 ENCOUNTER — Encounter: Payer: Self-pay | Admitting: *Deleted

## 2014-03-18 ENCOUNTER — Other Ambulatory Visit: Payer: Self-pay | Admitting: Cardiovascular Disease

## 2014-03-18 ENCOUNTER — Ambulatory Visit: Payer: Self-pay | Admitting: Internal Medicine

## 2014-03-19 ENCOUNTER — Ambulatory Visit (INDEPENDENT_AMBULATORY_CARE_PROVIDER_SITE_OTHER): Payer: Medicare Other | Admitting: Internal Medicine

## 2014-03-19 ENCOUNTER — Encounter: Payer: Self-pay | Admitting: Internal Medicine

## 2014-03-19 VITALS — BP 122/70 | HR 76 | Temp 97.7°F | Resp 16 | Ht 64.75 in | Wt 148.0 lb

## 2014-03-19 DIAGNOSIS — I10 Essential (primary) hypertension: Secondary | ICD-10-CM

## 2014-03-19 DIAGNOSIS — Z79899 Other long term (current) drug therapy: Secondary | ICD-10-CM

## 2014-03-19 DIAGNOSIS — R7309 Other abnormal glucose: Secondary | ICD-10-CM

## 2014-03-19 DIAGNOSIS — Z23 Encounter for immunization: Secondary | ICD-10-CM

## 2014-03-19 DIAGNOSIS — E782 Mixed hyperlipidemia: Secondary | ICD-10-CM

## 2014-03-19 DIAGNOSIS — E559 Vitamin D deficiency, unspecified: Secondary | ICD-10-CM

## 2014-03-19 DIAGNOSIS — R7303 Prediabetes: Secondary | ICD-10-CM

## 2014-03-19 LAB — CBC WITH DIFFERENTIAL/PLATELET
BASOS ABS: 0.1 10*3/uL (ref 0.0–0.1)
Basophils Relative: 1 % (ref 0–1)
Eosinophils Absolute: 0.1 10*3/uL (ref 0.0–0.7)
Eosinophils Relative: 2 % (ref 0–5)
HCT: 43.5 % (ref 36.0–46.0)
Hemoglobin: 14.7 g/dL (ref 12.0–15.0)
Lymphocytes Relative: 34 % (ref 12–46)
Lymphs Abs: 2.5 10*3/uL (ref 0.7–4.0)
MCH: 30.4 pg (ref 26.0–34.0)
MCHC: 33.8 g/dL (ref 30.0–36.0)
MCV: 89.9 fL (ref 78.0–100.0)
MPV: 10.5 fL (ref 9.4–12.4)
Monocytes Absolute: 0.7 10*3/uL (ref 0.1–1.0)
Monocytes Relative: 10 % (ref 3–12)
NEUTROS PCT: 53 % (ref 43–77)
Neutro Abs: 3.9 10*3/uL (ref 1.7–7.7)
Platelets: 268 10*3/uL (ref 150–400)
RBC: 4.84 MIL/uL (ref 3.87–5.11)
RDW: 14 % (ref 11.5–15.5)
WBC: 7.4 10*3/uL (ref 4.0–10.5)

## 2014-03-19 LAB — HEMOGLOBIN A1C
Hgb A1c MFr Bld: 5.8 % — ABNORMAL HIGH (ref <5.7)
Mean Plasma Glucose: 120 mg/dL — ABNORMAL HIGH (ref <117)

## 2014-03-19 NOTE — Patient Instructions (Signed)

## 2014-03-19 NOTE — Progress Notes (Signed)
[name], female   DOB: 02/04/1928, 80 y.o.   MRN: [id number]   This very nice 80 y.o.[name] presents for 3 month follow up with Hypertension, Hyperlipidemia, Pre-Diabetes and Vitamin D Deficiency. Patient relkates 45 yo husband recently died in 2013-07-08 and she feels that she is coping well with the loss.    Patient is treated for HTN (1988)  & BP has been controlled at home. Today's BP: 122/70 mmHg. Heart Caths in 1993 & 2007 found no significant CAD. Patient does have ASHD and permanent cardiac pacemaker (2007) and also chAfib and in on Coumadin thru [facility]. Patient did  have a TIA in 2006 w/o recurrence. Patient has had no complaints of any cardiac type chest pain, palpitations, dyspnea/orthopnea/PND, dizziness, claudication, or dependent edema.   Hyperlipidemia is controlled with diet & meds. Patient denies myalgias or other med SE's. Last Lipids were near goal - Total Chol  162; HDL 37*; LDL 102*; Trig 114 on 12/17/2013.   Also, the patient has history of PreDiabetes (A1c 6.0% in Oct 2014) and has had no symptoms of reactive hypoglycemia, diabetic polys, paresthesias or visual blurring.  Last A1c was  6.1% on  12/17/2013.   Further, the patient also has history of Vitamin D Deficiency of 13 in 2008 and supplements vitamin D without any suspected side-effects. Last vitamin D was  57 on  12/17/2013.   Medication List   aspirin EC 81 MG tablet  Take 81 mg by mouth daily.     carvedilol 12.5 MG tablet  Commonly known as:  COREG  TAKE 1/2 TO 1 TABLET BY MOUTH TWICE DAILY FOR BLOOD PRESSURE AND HEART     cholecalciferol 1000 UNITS tablet  Commonly known as:  VITAMIN D  Take 1,000 Units by mouth 3 (three) times daily.     diltiazem 360 MG 24 hr capsule  Commonly known as:  TIAZAC  Take 1 capsule (360 mg total) by mouth daily.     diltiazem 360 MG 24 hr capsule  Commonly known as:  CARDIZEM CD  take 1 capsule by mouth once daily     furosemide 40 MG tablet   Commonly known as:  LASIX  Take 20 mg by mouth daily.     gabapentin 300 MG capsule  Commonly known as:  NEURONTIN  Take 1 capsule (300 mg total) by mouth 2 (two) times daily.     glucosamine-chondroitin 500-400 MG tablet  Take 1 tablet by mouth daily.     Magnesium 250 MG Tabs  Take 1 tablet by mouth daily.     nystatin 100000 UNIT/ML suspension  Commonly known as:  MYCOSTATIN  64ml swish and swallow 4 times day for 2 days after the symptoms stop.     potassium chloride SA 20 MEQ tablet  Commonly known as:  K-DUR,KLOR-CON  take 1 tablet by mouth once daily     pyridOXINE 50 MG tablet  Commonly known as:  VITAMIN B-6  Take 50 mg by mouth daily.     VESICARE 5 MG tablet  Generic drug:  solifenacin  Take 5 mg by mouth daily.     warfarin 3 MG tablet  Commonly known as:  COUMADIN  Take 3-4.5 mg by mouth See admin instructions. Takes 3mg  everyday except Monday and Friday's takes 4.5mg      warfarin 3 MG tablet  Commonly known as:  COUMADIN  take 1 to 1 and 1/2 tablets by mouth as directed  Allergies  Allergen Reactions  . Ace Inhibitors     Unknown   . Acrylic Polymer [Carbomer]   . Augmentin [Amoxicillin-Pot Clavulanate]   . Chocolate Other (See Comments)    migraine's   . Ciprofloxacin   . Gabapentin Other (See Comments)    Makes her sleepy   . Levaquin [Levofloxacin In D5w]   . Zocor [Simvastatin]    PMHx:   Past Medical History  Diagnosis Date  . Coronary artery disease 03/19/2002    R/P Cardiolite - EF 76%; nromal static and dynamic myocardial perfusion images; normal wall motion and endocardial thickening in all vascular territories  . Hypertension   . Atrial fib/flutter, transient   . Skin cancer     s/p surgical removal.  . Pacemaker   . TIA (transient ischemic attack)   . CHF (congestive heart failure) 10/29/2009    Echo - EF >55%; normal LV size and systolic function; unable to assess diastolic fcn due to E/A fusion, pulmonary vein flow pattern  suggests elevated filling pressure; marked biatrail dilation, mild/mod tricuspid regurgitation; mod pulmonary htn; mild/mod mitral regurgitation; although echocardiographic features are incomplete findings suggest possible infiltrative cardiomyopathy (maybe amyloidosi  . Facial numbness 12/26/2008    carotid doppler - R and L ICAs 0-49% diameter reduction (velocities suggest low end of scale)  . Atrial fibrillation, chronic   . Peripheral neuropathy    Immunization History  Administered Date(s) Administered  . Influenza Split 05/04/2011  . Influenza, High Dose Seasonal PF 03/19/2014  . Influenza,inj,quad, With Preservative 05/21/2013  . Pneumococcal Conjugate-13 03/19/2014  . Pneumococcal Polysaccharide-23 05/04/2011  . Pneumococcal-Unspecified 05/03/2001  . Td 05/04/2003   Past Surgical History  Procedure Laterality Date  . Cardioversion  11/19/2009    successful DCCV from AF to sinus type rhythm  . Cardiac catheterization  08/06/2005    minimal coronary disease predominant RCA; no significant atherosclerosis; new onset sick sinus syndrome and atrial flutter w/ ventricular response, controlled on med therapy; systemic HTN, normal renal arteries  . Cholecystectomy    . Appendectomy    . Abdominal hysterectomy    . Skin cancer resection     FHx:    Reviewed / unchanged  SHx:    Reviewed / unchanged  Systems Review:  Constitutional: Denies fever, chills, wt changes, headaches, insomnia, fatigue, night sweats, change in appetite. Eyes: Denies redness, blurred vision, diplopia, discharge, itchy, watery eyes.  ENT: Denies discharge, congestion, post nasal drip, epistaxis, sore throat, earache, hearing loss, dental pain, tinnitus, vertigo, sinus pain, snoring.  CV: Denies chest pain, palpitations, irregular heartbeat, syncope, dyspnea, diaphoresis, orthopnea, PND, claudication or edema. Respiratory: denies cough, dyspnea, DOE, pleurisy, hoarseness, laryngitis, wheezing.  Gastrointestinal:  Denies dysphagia, odynophagia, heartburn, reflux, water brash, abdominal pain or cramps, nausea, vomiting, bloating, diarrhea, constipation, hematemesis, melena, hematochezia  or hemorrhoids. Genitourinary: Denies dysuria, frequency, urgency, nocturia, hesitancy, discharge, hematuria or flank pain. Musculoskeletal: Denies arthralgias, myalgias, stiffness, jt. swelling, pain, limping or strain/sprain.  Skin: Denies pruritus, rash, hives, warts, acne, eczema or change in skin lesion(s). Neuro: No weakness, tremor, incoordination, spasms, paresthesia or pain. Psychiatric: Denies confusion, memory loss or sensory loss. Endo: Denies change in weight, skin or hair change.  Heme/Lymph: No excessive bleeding, bruising or enlarged lymph nodes.  Exam:  BP 122/70   Pulse 76  Temp 97.7 F   Resp 16  Ht 5' 4.75"   Wt 148 lb   BMI 24.81   Appears well nourished and in no distress. Eyes: PERRLA, EOMs, conjunctiva no  swelling or erythema. Sinuses: No frontal/maxillary tenderness ENT/Mouth: EAC's clear, TM's nl w/o erythema, bulging. Nares clear w/o erythema, swelling, exudates. Oropharynx clear without erythema or exudates. Oral hygiene is good. Tongue normal, non obstructing. Hearing intact.  Neck: Supple. Thyroid nl. Car 2+/2+ without bruits, nodes or JVD. Chest: Respirations nl with BS clear & equal w/o rales, rhonchi, wheezing or stridor.  Cor: Heart sounds normal w/ regular rate and rhythm without sig. murmurs, gallops, clicks, or rubs. Peripheral pulses normal and equal  without edema.  Abdomen: Soft & bowel sounds normal. Non-tender w/o guarding, rebound, hernias, masses, or organomegaly.  Lymphatics: Unremarkable.  Musculoskeletal: Full ROM all peripheral extremities, joint stability, 5/5 strength, and normal gait.  Skin: Warm, dry without exposed rashes, lesions or ecchymosis apparent.  Neuro: Cranial nerves intact, reflexes equal bilaterally. Sensory-motor testing grossly intact. Tendon  reflexes grossly intact.  Pysch: Alert & oriented x 3.  Insight and judgement nl & appropriate. No ideations.  Assessment and Plan:  1. Hypertension - Continue monitor blood pressure at home. Continue diet/meds same.  2. Hyperlipidemia - Continue diet/meds, exercise,& lifestyle modifications. Continue monitor periodic cholesterol/liver & renal functions   3.  Pre-Diabetes - Continue diet, exercise, lifestyle modifications. Monitor appropriate labs.  4. Vitamin D Deficiency - Continue supplementation.  5. ASHD/Afib/ Pacemaker(2007) - as per Cardiology  6. ASCVD/ TIA -  7. Depression - controlled  Recommended regular exercise, BP monitoring, weight control, and discussed med and SE's. Recommended labs to assess and monitor clinical status. Further disposition pending results of labs.

## 2014-03-20 LAB — LIPID PANEL
Cholesterol: 157 mg/dL (ref 0–200)
HDL: 41 mg/dL (ref >39)
LDL CALC: 86 mg/dL (ref 0–99)
TRIGLYCERIDES: 148 mg/dL (ref <150)
Total CHOL/HDL Ratio: 3.8 Ratio
VLDL: 30 mg/dL (ref 0–40)

## 2014-03-20 LAB — HEPATIC FUNCTION PANEL
ALBUMIN: 4.2 g/dL (ref 3.5–5.2)
ALK PHOS: 163 U/L — AB (ref 39–117)
ALT: 13 U/L (ref 0–35)
AST: 18 U/L (ref 0–37)
BILIRUBIN INDIRECT: 0.5 mg/dL (ref 0.2–1.2)
Bilirubin, Direct: 0.1 mg/dL (ref 0.0–0.3)
TOTAL PROTEIN: 7 g/dL (ref 6.0–8.3)
Total Bilirubin: 0.6 mg/dL (ref 0.2–1.2)

## 2014-03-20 LAB — BASIC METABOLIC PANEL WITH GFR
BUN: 18 mg/dL (ref 6–23)
CHLORIDE: 102 meq/L (ref 96–112)
CO2: 27 meq/L (ref 19–32)
CREATININE: 0.72 mg/dL (ref 0.50–1.10)
Calcium: 9.1 mg/dL (ref 8.4–10.5)
GFR, EST NON AFRICAN AMERICAN: 77 mL/min
GFR, Est African American: 89 mL/min
Glucose, Bld: 75 mg/dL (ref 70–99)
POTASSIUM: 4.1 meq/L (ref 3.5–5.3)
Sodium: 141 mEq/L (ref 135–145)

## 2014-03-20 LAB — INSULIN, FASTING: Insulin fasting, serum: 7.1 u[IU]/mL (ref 2.0–19.6)

## 2014-03-20 LAB — TSH: TSH: 1.846 u[IU]/mL (ref 0.350–4.500)

## 2014-03-20 LAB — VITAMIN D 25 HYDROXY (VIT D DEFICIENCY, FRACTURES): Vit D, 25-Hydroxy: 42 ng/mL (ref 30–100)

## 2014-03-20 LAB — MAGNESIUM: Magnesium: 1.9 mg/dL (ref 1.5–2.5)

## 2014-03-25 ENCOUNTER — Ambulatory Visit (INDEPENDENT_AMBULATORY_CARE_PROVIDER_SITE_OTHER): Payer: Medicare Other | Admitting: Pharmacist Clinician (PhC)/ Clinical Pharmacy Specialist

## 2014-03-25 DIAGNOSIS — Z7901 Long term (current) use of anticoagulants: Secondary | ICD-10-CM

## 2014-03-25 DIAGNOSIS — I4891 Unspecified atrial fibrillation: Secondary | ICD-10-CM

## 2014-03-25 LAB — POCT INR: INR: 2.2

## 2014-04-05 ENCOUNTER — Encounter: Payer: Self-pay | Admitting: Cardiovascular Disease

## 2014-04-11 ENCOUNTER — Encounter: Payer: Self-pay | Admitting: Cardiovascular Disease

## 2014-04-29 ENCOUNTER — Other Ambulatory Visit: Payer: Self-pay | Admitting: Internal Medicine

## 2014-04-30 ENCOUNTER — Encounter: Payer: Self-pay | Admitting: *Deleted

## 2014-05-06 ENCOUNTER — Ambulatory Visit (INDEPENDENT_AMBULATORY_CARE_PROVIDER_SITE_OTHER): Payer: Medicare Other | Admitting: Pharmacist Clinician (PhC)/ Clinical Pharmacy Specialist

## 2014-05-06 DIAGNOSIS — I4891 Unspecified atrial fibrillation: Secondary | ICD-10-CM

## 2014-05-06 DIAGNOSIS — Z7901 Long term (current) use of anticoagulants: Secondary | ICD-10-CM

## 2014-05-06 LAB — POCT INR: INR: 2.4

## 2014-05-08 ENCOUNTER — Telehealth: Payer: Self-pay | Admitting: *Deleted

## 2014-05-08 NOTE — Telephone Encounter (Signed)
I explained to pt that her remotes are being received and that the letter was b/c she did not make an appt during her recall month (01/2014). Patient voiced understanding.

## 2014-05-08 NOTE — Telephone Encounter (Signed)
LMTCB/SSS 

## 2014-05-08 NOTE — Telephone Encounter (Signed)
-----   Message from Tommy Medal, [place] sent at 05/06/2014 10:00 AM EST ----- Happy New Year  Ms. Widrig is worried because she got a letter asking if she was seeing a new MD, as her device had not been downloaded in awhile.  This makes her very nervous/upset.  She says she's been doing the download once monthly ("the lights flash on the box and everything"!).  I reassured her that she doesn't need to worry, she has an appointment with [facility] on March 1.  She was going to try a download again today.  Can you see if they are going thru?  Probably need to arrange for regular downloads at the office - the stress of home downloads is getting to her.  Thanks, Erasmo Downer

## 2014-06-17 ENCOUNTER — Ambulatory Visit: Payer: Self-pay | Admitting: Pharmacist Clinician (PhC)/ Clinical Pharmacy Specialist

## 2014-06-19 ENCOUNTER — Telehealth: Payer: Self-pay | Admitting: Internal Medicine

## 2014-06-19 NOTE — Telephone Encounter (Signed)
Left message for patient to call and resch 06-26-14 office visit, was with JC  Thank you, [facility] [phone] Fax [phone]

## 2014-06-26 ENCOUNTER — Ambulatory Visit: Payer: Self-pay | Admitting: Physician Assistant

## 2014-06-28 ENCOUNTER — Ambulatory Visit (INDEPENDENT_AMBULATORY_CARE_PROVIDER_SITE_OTHER): Payer: Medicare Other | Admitting: Pharmacist Clinician (PhC)/ Clinical Pharmacy Specialist

## 2014-06-28 DIAGNOSIS — I4891 Unspecified atrial fibrillation: Secondary | ICD-10-CM

## 2014-06-28 DIAGNOSIS — Z7901 Long term (current) use of anticoagulants: Secondary | ICD-10-CM

## 2014-06-28 LAB — POCT INR: INR: 2.1

## 2014-07-02 ENCOUNTER — Other Ambulatory Visit: Payer: Self-pay | Admitting: Internal Medicine

## 2014-07-02 ENCOUNTER — Encounter: Payer: Self-pay | Admitting: Cardiovascular Disease

## 2014-07-02 ENCOUNTER — Ambulatory Visit (INDEPENDENT_AMBULATORY_CARE_PROVIDER_SITE_OTHER): Payer: Medicare Other | Admitting: Cardiovascular Disease

## 2014-07-02 VITALS — BP 130/76 | HR 69 | Resp 16 | Ht 64.5 in | Wt 146.2 lb

## 2014-07-02 DIAGNOSIS — I482 Chronic atrial fibrillation, unspecified: Secondary | ICD-10-CM

## 2014-07-02 DIAGNOSIS — I10 Essential (primary) hypertension: Secondary | ICD-10-CM

## 2014-07-02 DIAGNOSIS — Z950 Presence of cardiac pacemaker: Secondary | ICD-10-CM

## 2014-07-02 DIAGNOSIS — R0600 Dyspnea, unspecified: Secondary | ICD-10-CM

## 2014-07-02 DIAGNOSIS — F411 Generalized anxiety disorder: Secondary | ICD-10-CM

## 2014-07-02 LAB — MDC_IDC_ENUM_SESS_TYPE_INCLINIC
Battery Impedance: 512 Ohm
Battery Remaining Longevity: 85 mo
Battery Voltage: 2.79 V
Brady Statistic RV Percent Paced: 89 %
Date Time Interrogation Session: 20160301144949
Lead Channel Impedance Value: 714 Ohm
Lead Channel Pacing Threshold Pulse Width: 0.4 ms
Lead Channel Setting Sensing Sensitivity: 4 mV
MDC IDC MSMT LEADCHNL RA IMPEDANCE VALUE: 67 Ohm
MDC IDC MSMT LEADCHNL RV PACING THRESHOLD AMPLITUDE: 0.5 V
MDC IDC SET LEADCHNL RV PACING AMPLITUDE: 2.5 V
MDC IDC SET LEADCHNL RV PACING PULSEWIDTH: 0.4 ms

## 2014-07-02 MED ORDER — CARVEDILOL 3.125 MG PO TABS
3.1250 mg | ORAL_TABLET | Freq: Two times a day (BID) | ORAL | Status: DC
Start: 2014-07-02 — End: 2015-01-14

## 2014-07-02 NOTE — Patient Instructions (Addendum)
Your physician has requested that you have an echocardiogram. Echocardiography is a painless test that uses sound waves to create images of your heart. It provides your doctor with information about the size and shape of your heart and how well your heart's chambers and valves are working. This procedure takes approximately one hour. There are no restrictions for this procedure.  Your physician has recommended you make the following change in your medication: DECREASE CARVEDILOL TO 3.125 MG TWICE DAILY  Your physician recommends that you schedule a follow-up appointment in: [place]

## 2014-07-08 ENCOUNTER — Encounter: Payer: Self-pay | Admitting: Cardiovascular Disease

## 2014-07-08 NOTE — Progress Notes (Signed)
[name], female   DOB: 05/20/1927, 81 y.o.   MRN: [id number]     Cardiology Office Note   Date:  07/08/2014   [name], DOB [birth year], MRN [id number]  PCP:  Alesia Richards, MD  Cardiologist:   Sanda Klein, MD   Chief Complaint  Patient presents with  . Annual Exam    patient reports that she is very stressed out, her husband died, she is selling her home, patient c/o shortness of breath with minimal exertion, and occas at rest, and discomfort-similar to indigestion      History of Present Illness: Misty Blackwell is a 81 y.o. female who presents for atrial fibrillation with slow ventricular response, s/p dual-chamber permanent pacemaker implantation ([employer] 2011) and severe, but well controlled systemic hypertension and a history of mild pulmonary artery hypertension. She has a remote history of TIA, takes warfarin, but has normal LVEF.  She is very emotional after the loss of her husband and putting her home up for sale. We talked a lot about these issus today. She complains of exertional dyspnea and does not know if it is secondary to her "stress".    Past Medical History  Diagnosis Date  . Coronary artery disease 03/19/2002    R/P Cardiolite - EF 76%; nromal static and dynamic myocardial perfusion images; normal wall motion and endocardial thickening in all vascular territories  . Hypertension   . Atrial fib/flutter, transient   . Skin cancer     s/p surgical removal.  . Pacemaker   . TIA (transient ischemic attack)   . CHF (congestive heart failure) 10/29/2009    Echo - EF >55%; normal LV size and systolic function; unable to assess diastolic fcn due to E/A fusion, pulmonary vein flow pattern suggests elevated filling pressure; marked biatrail dilation, mild/mod tricuspid regurgitation; mod pulmonary htn; mild/mod mitral regurgitation; although echocardiographic features are incomplete findings suggest possible infiltrative cardiomyopathy  (maybe amyloidosi  . Facial numbness 12/26/2008    carotid doppler - R and L ICAs 0-49% diameter reduction (velocities suggest low end of scale)  . Atrial fibrillation, chronic   . Peripheral neuropathy     Past Surgical History  Procedure Laterality Date  . Cardioversion  11/19/2009    successful DCCV from AF to sinus type rhythm  . Cardiac catheterization  08/06/2005    minimal coronary disease predominant RCA; no significant atherosclerosis; new onset sick sinus syndrome and atrial flutter w/ ventricular response, controlled on med therapy; systemic HTN, normal renal arteries  . Cholecystectomy    . Appendectomy    . Abdominal hysterectomy    . Skin cancer resection       Current Outpatient Prescriptions  Medication Sig Dispense Refill  . ALPRAZolam (XANAX) 1 MG tablet take 1/2 to 1 tablet by mouth twice a day 60 tablet 3  . aspirin EC 81 MG tablet Take 81 mg by mouth daily.    . cholecalciferol (VITAMIN D) 1000 UNITS tablet Take 1,000 Units by mouth 3 (three) times daily.     Marland Kitchen diltiazem (TIAZAC) 360 MG 24 hr capsule Take 1 capsule (360 mg total) by mouth daily. 30 capsule 6  . furosemide (LASIX) 40 MG tablet Take 20 mg by mouth daily.    Marland Kitchen gabapentin (NEURONTIN) 300 MG capsule take 1 capsule by mouth twice a day 60 capsule 2  . glucosamine-chondroitin 500-400 MG tablet Take 1 tablet by mouth daily.    . Magnesium 250 MG TABS Take 1 tablet  by mouth daily.    . potassium chloride SA (K-DUR,KLOR-CON) 20 MEQ tablet take 1 tablet by mouth once daily 30 tablet 4  . pyridOXINE (VITAMIN B-6) 50 MG tablet Take 50 mg by mouth daily.    . VESICARE 5 MG tablet Take 5 mg by mouth daily.    Marland Kitchen warfarin (COUMADIN) 3 MG tablet Take 3-4.5 mg by mouth See admin instructions. Takes 3mg  everyday except Monday and Friday's takes 4.5mg     . carvedilol (COREG) 3.125 MG tablet Take 1 tablet (3.125 mg total) by mouth 2 (two) times daily. 180 tablet 3   No current facility-administered medications for this  visit.    Allergies:   Ace inhibitors; Acrylic polymer; Augmentin; Chocolate; Ciprofloxacin; Gabapentin; Levaquin; and Zocor    Social History:  The patient  reports that she has never smoked. She has never used smokeless tobacco. She reports that she does not drink alcohol or use illicit drugs.   Family History:  The patient's family history includes Cirrhosis in her brother; Diabetes in her mother; Heart attack in her father; Heart disease in her father and mother.    ROS:  Please see the history of present illness.    Mild exertional dyspnea, hair loss. Otherwise, review of systems are positive for none.    The patient specifically denies any chest pain at rest or with exertion, dyspnea at rest or with exertion, orthopnea, paroxysmal nocturnal dyspnea, syncope, palpitations, focal neurological deficits, intermittent claudication, lower extremity edema, unexplained weight gain, cough, hemoptysis or wheezing.  The patient also denies abdominal pain, nausea, vomiting, dysphagia, diarrhea, constipation, polyuria, polydipsia, dysuria, hematuria, frequency, urgency, abnormal bleeding or bruising, fever, chills, unexpected weight changes, mood swings, change in skin or hair texture, change in voice quality, auditory or visual problems, allergic reactions or rashes, new musculoskeletal complaints other than usual "aches and pains".  All other systems are reviewed and negative.    PHYSICAL EXAM: VS:  BP 130/76 mmHg  Pulse 69  Resp 16  Ht 5' 4.5" (1.638 m)  Wt 146 lb 3.2 oz (66.316 kg)  BMI 24.72 kg/m2 , BMI Body mass index is 24.72 kg/(m^2). General: Alert, oriented x3, no distress  Head: no evidence of trauma, PERRL, EOMI, no exophtalmos or lid lag, no myxedema, no xanthelasma; normal ears, nose and oropharynx  Neck: normal jugular venous pulsations and no hepatojugular reflux; brisk carotid pulses without delay and no carotid bruits  Chest: clear to auscultation, no signs of  consolidation by percussion or palpation, normal fremitus, symmetrical and full respiratory excursions. Left subclavian pacemaker site appears healthy  Cardiovascular: normal position and quality of the apical impulse, regular rhythm with occasional irregularity, normal first and second heart sounds, no murmurs, rubs or gallops  Abdomen: no tenderness or distention, no masses by palpation, no abnormal pulsatility or arterial bruits, normal bowel sounds, no hepatosplenomegaly  Extremities: no clubbing, cyanosis or edema; 2+ radial, ulnar and brachial pulses bilaterally; 2+ right femoral, posterior tibial and dorsalis pedis pulses; 2+ left femoral, posterior tibial and dorsalis pedis pulses; no subclavian or femoral bruits  Neurological: grossly nonfocal Psych: euthymic mood, full affect  EKG:  EKG is ordered today. The ekg ordered today demonstrates atrial fibrillation, intermittent ventricular pacing (89% by device check), LVH  Recent Labs: 03/19/2014: ALT 13; BUN 18; Creatinine 0.72; Hemoglobin 14.7; Magnesium 1.9; Platelets 268; Potassium 4.1; Sodium 141; TSH 1.846    Lipid Panel    Component Value Date/Time   CHOL 157 03/19/2014 1027   TRIG 148  03/19/2014 1027   HDL 41 03/19/2014 1027   CHOLHDL 3.8 03/19/2014 1027   VLDL 30 03/19/2014 1027   LDLCALC 86 03/19/2014 1027      Wt Readings from Last 3 Encounters:  07/02/14 146 lb 3.2 oz (66.316 kg)  03/19/14 148 lb (67.132 kg)  12/17/13 152 lb (68.947 kg)      ASSESSMENT AND PLAN:  1. Permanent atrial fibrillation. Slow ventricular response (excessive rate control?). Continue warfarin. Decrease beta blocker (also possible hair loss cause?)  2. Normal dual chamber pacemaker function Check remotely Q3 months.  3. Exertional dyspnea  Reevaluate LVEF and PA pressure by echo. Clinically no overt hypervolemia   Current medicines are reviewed at length with the patient today.  The patient does not have concerns regarding  medicines.  The following changes have been made:  Decrease carvedilol to 3.125 mg BID  Labs/ tests ordered today include:  Orders Placed This Encounter  Procedures  . Implantable device check  . EKG 12-Lead  . 2D Echocardiogram without contrast   Patient Instructions  Your physician has requested that you have an echocardiogram. Echocardiography is a painless test that uses sound waves to create images of your heart. It provides your doctor with information about the size and shape of your heart and how well your heart's chambers and valves are working. This procedure takes approximately one hour. There are no restrictions for this procedure.  Your physician has recommended you make the following change in your medication: DECREASE CARVEDILOL TO 3.125 MG TWICE DAILY  Your physician recommends that you schedule a follow-up appointment in: [place], Larose Batres, MD  07/08/2014 10:21 PM    [facility] [place], [place], [place]  [address] Phone: [phone]; Fax: [phone]

## 2014-07-09 ENCOUNTER — Telehealth: Payer: Self-pay | Admitting: *Deleted

## 2014-07-09 ENCOUNTER — Ambulatory Visit (HOSPITAL_COMMUNITY)
Admission: RE | Admit: 2014-07-09 | Discharge: 2014-07-09 | Disposition: A | Discharge status: Home / Self Care | Payer: Medicare Other | Source: Ambulatory Visit | Attending: Cardiovascular Disease | Admitting: Cardiovascular Disease

## 2014-07-09 DIAGNOSIS — R0600 Dyspnea, unspecified: Secondary | ICD-10-CM | POA: Insufficient documentation

## 2014-07-09 NOTE — Progress Notes (Signed)
2D Echo Performed 07/09/2014    Naavya Postma, RCS  

## 2014-07-09 NOTE — Telephone Encounter (Signed)
-----   Message from Sanda Klein, MD sent at 07/08/2014 10:23 PM EST ----- Misty Blackwell's AVS recommends carvedilol 3.125 mg daily. It should be 3.125 mg BID. Can you please make sure she gets the right information?

## 2014-07-09 NOTE — Telephone Encounter (Signed)
Called patient to verify she is taking carvedilol twice.  Patient checked her bottle and she is taking twice daily.

## 2014-07-11 ENCOUNTER — Encounter: Payer: Self-pay | Admitting: Cardiovascular Disease

## 2014-07-17 ENCOUNTER — Ambulatory Visit (INDEPENDENT_AMBULATORY_CARE_PROVIDER_SITE_OTHER): Payer: Medicare Other | Admitting: Physician Assistant

## 2014-07-17 ENCOUNTER — Encounter: Payer: Self-pay | Admitting: Physician Assistant

## 2014-07-17 VITALS — BP 120/70 | HR 76 | Temp 97.7°F | Resp 16 | Ht 64.75 in | Wt 151.0 lb

## 2014-07-17 DIAGNOSIS — N6019 Diffuse cystic mastopathy of unspecified breast: Secondary | ICD-10-CM

## 2014-07-17 DIAGNOSIS — R7303 Prediabetes: Secondary | ICD-10-CM

## 2014-07-17 DIAGNOSIS — I2510 Atherosclerotic heart disease of native coronary artery without angina pectoris: Secondary | ICD-10-CM

## 2014-07-17 DIAGNOSIS — Z789 Other specified health status: Secondary | ICD-10-CM

## 2014-07-17 DIAGNOSIS — F329 Major depressive disorder, single episode, unspecified: Secondary | ICD-10-CM

## 2014-07-17 DIAGNOSIS — E559 Vitamin D deficiency, unspecified: Secondary | ICD-10-CM

## 2014-07-17 DIAGNOSIS — K2100 Gastro-esophageal reflux disease with esophagitis, without bleeding: Secondary | ICD-10-CM

## 2014-07-17 DIAGNOSIS — I10 Essential (primary) hypertension: Secondary | ICD-10-CM

## 2014-07-17 DIAGNOSIS — F32A Depression, unspecified: Secondary | ICD-10-CM

## 2014-07-17 DIAGNOSIS — M1990 Unspecified osteoarthritis, unspecified site: Secondary | ICD-10-CM

## 2014-07-17 DIAGNOSIS — I482 Chronic atrial fibrillation, unspecified: Secondary | ICD-10-CM

## 2014-07-17 DIAGNOSIS — R6889 Other general symptoms and signs: Secondary | ICD-10-CM

## 2014-07-17 DIAGNOSIS — Z0001 Encounter for general adult medical examination with abnormal findings: Secondary | ICD-10-CM

## 2014-07-17 DIAGNOSIS — E782 Mixed hyperlipidemia: Secondary | ICD-10-CM

## 2014-07-17 DIAGNOSIS — Z7901 Long term (current) use of anticoagulants: Secondary | ICD-10-CM

## 2014-07-17 DIAGNOSIS — Z950 Presence of cardiac pacemaker: Secondary | ICD-10-CM

## 2014-07-17 DIAGNOSIS — J189 Pneumonia, unspecified organism: Secondary | ICD-10-CM

## 2014-07-17 DIAGNOSIS — Z79899 Other long term (current) drug therapy: Secondary | ICD-10-CM

## 2014-07-17 LAB — CBC WITH DIFFERENTIAL/PLATELET
BASOS ABS: 0.1 10*3/uL (ref 0.0–0.1)
BASOS PCT: 1 % (ref 0–1)
EOS PCT: 1 % (ref 0–5)
Eosinophils Absolute: 0.1 10*3/uL (ref 0.0–0.7)
HCT: 43.4 % (ref 36.0–46.0)
Hemoglobin: 14.2 g/dL (ref 12.0–15.0)
LYMPHS PCT: 31 % (ref 12–46)
Lymphs Abs: 2.4 10*3/uL (ref 0.7–4.0)
MCH: 30 pg (ref 26.0–34.0)
MCHC: 32.7 g/dL (ref 30.0–36.0)
MCV: 91.6 fL (ref 78.0–100.0)
MPV: 10.3 fL (ref 8.6–12.4)
Monocytes Absolute: 0.8 10*3/uL (ref 0.1–1.0)
Monocytes Relative: 11 % (ref 3–12)
Neutro Abs: 4.3 10*3/uL (ref 1.7–7.7)
Neutrophils Relative %: 56 % (ref 43–77)
PLATELETS: 238 10*3/uL (ref 150–400)
RBC: 4.74 MIL/uL (ref 3.87–5.11)
RDW: 13.7 % (ref 11.5–15.5)
WBC: 7.7 10*3/uL (ref 4.0–10.5)

## 2014-07-17 NOTE — Patient Instructions (Addendum)
Use a dropper or use a cap to put olive oil,mineral oil or canola oil in the effected ear- 2-3 times a week. Let it soak for 20-30 min then you can take a shower or use a baby bulb with warm water to wash out the ear wax.  Do not use Qtips   Silent reflux/GERD can cause a cough OR shortness of breath WITHOUT heart burn because the esophagus that goes to the stomach and trachea that goes to the lungs are very close and when you lay down the acid can irritate your throat and lungs. This can cause hoarseness, cough, and wheezing. Please stop any alcohol or anti-inflammatories like aleve/advil/ibuprofen and start an over the counter Prilosec or omeprazole 1-2 times daily 31mins before food for 2 weeks, then switch to over the counter zantac/ratinidine or pepcid/famotadine once at night for 2 weeks.    [facility]  7 a.m.-6:30 p.m., Monday 7 a.m.-5 p.m., Tuesday-Friday Schedule an appointment by calling [phone].  [facility] Schedule an appointment by calling [phone].     Bad carbs also include fruit juice, alcohol, and sweet tea. These are empty calories that do not signal to your brain that you are full.   Please remember the good carbs are still carbs which convert into sugar. So please measure them out no more than 1/2-1 cup of rice, oatmeal, pasta, and beans  Veggies are however free foods! Pile them on.   Not all fruit is created equal. Please see the list below, the fruit at the bottom is higher in sugars than the fruit at the top. Please avoid all dried fruits.

## 2014-07-17 NOTE — Progress Notes (Signed)
MEDICARE ANNUAL WELLNESS VISIT AND FOLLOW UP  Assessment:   1. Essential hypertension - continue medications, DASH diet, exercise and monitor at home. Call if greater than 130/80.  - CBC with Differential/Platelet - BASIC METABOLIC PANEL WITH GFR - Hepatic function panel - TSH  2. Atherosclerosis of native coronary artery of native heart without angina pectoris Control blood pressure, cholesterol, glucose, increase exercise.  Continue cardio follow up  3. Chronic atrial fibrillation Continue coumadin, Continue cardio follow up  4. CAP (community acquired pneumonia) Has had pneumo vaccines, and doing well, lungs CTAB  5. Gastroesophageal reflux disease with esophagitis ? Attributing to SOB/hoarseness- normal cardio work up with Dr. Loletha Grayer, versus depression- gave samples of dexilant to try for 5 days to see if there is a difference.   6. Prediabetes Discussed general issues about diabetes pathophysiology and management., Educational material distributed., Suggested low cholesterol diet., Encouraged aerobic exercise., Discussed foot care., Reminded to get yearly retinal exam. - Hemoglobin A1c - Insulin, fasting  7. Hyperlipidemia -continue medications, check lipids, decrease fatty foods, increase activity.  - Lipid panel  8. Osteoarthritis, unspecified osteoarthritis type, unspecified site RICE, NSAIDS, exercises given, if not better get xray and PT referral or ortho referral.   9. Depression Seeing a counselor, declines medicine.   10. Long term current use of anticoagulant therapy Continue cardio follow up  11. Pacemaker Continue cardio follow up  12. Vitamin D deficiency - Vit D  25 hydroxy (rtn osteoporosis monitoring)  13. Medication management - Magnesium   Plan:   During the course of the visit the patient was educated and counseled about appropriate screening and preventive services including:    Pneumococcal vaccine   Influenza vaccine  Td  vaccine  Screening electrocardiogram  Screening mammography  Bone densitometry screening  Colorectal cancer screening  Diabetes screening  Glaucoma screening  Nutrition counseling   Advanced directives: given info/requested  Conditions/risks identified: BMI: Discussed weight loss, diet, and increase physical activity.  Increase physical activity: AHA recommends 150 minutes of physical activity a week.  Medications reviewed DEXA- requested Diabetes is at goal, ACE/ARB therapy: Yes. Urinary Incontinence is an issue: discussed non pharmacology and pharmacology options. She will continue vesicare and follow up with Dr. Risa Grill.   Fall risk: moderate- discussed PT, home fall assessment, medications.    Subjective:   Misty Blackwell is a 81 y.o. female who presents for Medicare Annual Wellness Visit and 3 month follow up on hypertension, prediabetes, hyperlipidemia, vitamin D def.  Date of last medicare wellness visit 12/17/2013  Her blood pressure has been controlled at home, today their BP is BP: 120/70 mmHg She does not workout. She denies chest pain, dizziness.  SOB x 6 months, varies greatly, nonexertional at times, states that her balance is off. She  She follows Dr. Sallyanne Kuster for ASHD and mild pulmonary artery hypertension, last cath 2007 and normal cardiolite 2003, afib with slow RVR with pacemaker insertion in 2011. She follows at the coumadin clinic. She has been complaining of SOB, saw Dr. Sallyanne Kuster on 07/08/2014,  had normal echo, normal pacemaker function, thought dyspnea could be related to BB or stress, her coreg was decreased to 3.125mg  BID on 07/18/2014.  She has had a history of TIA 2006 with out reoccurrence, is on coumadin.  She is not on cholesterol medication and denies myalgias. Her cholesterol is at goal. The cholesterol last visit was:   Lab Results  Component Value Date   CHOL 157 03/19/2014   HDL 41 03/19/2014  [place] 86 03/19/2014   TRIG 148 03/19/2014    CHOLHDL 3.8 03/19/2014   She has been working on diet and exercise for prediabetes, She has neuropathy in bilateral feet and denies polydipsia and polyuria. 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 She is on 300 mg two a day, 1 AM and 1 PM.  Her husband passed last march, she is now in counseling through her church, had first session yesterday.  Patient is on Vitamin D supplement. Lab Results  Component Value Date   VD25OH 42 03/19/2014     Medication Review Current Outpatient Prescriptions on File Prior to Visit  Medication Sig Dispense Refill  . ALPRAZolam (XANAX) 1 MG tablet take 1/2 to 1 tablet by mouth twice a day 60 tablet 3  . aspirin EC 81 MG tablet Take 81 mg by mouth daily.    . carvedilol (COREG) 3.125 MG tablet Take 1 tablet (3.125 mg total) by mouth 2 (two) times daily. 180 tablet 3  . cholecalciferol (VITAMIN D) 1000 UNITS tablet Take 1,000 Units by mouth 3 (three) times daily.     Marland Kitchen diltiazem (TIAZAC) 360 MG 24 hr capsule Take 1 capsule (360 mg total) by mouth daily. 30 capsule 6  . furosemide (LASIX) 40 MG tablet Take 20 mg by mouth daily.    Marland Kitchen gabapentin (NEURONTIN) 300 MG capsule take 1 capsule by mouth twice a day 60 capsule 2  . glucosamine-chondroitin 500-400 MG tablet Take 1 tablet by mouth daily.    . Magnesium 250 MG TABS Take 1 tablet by mouth daily.    . potassium chloride SA (K-DUR,KLOR-CON) 20 MEQ tablet take 1 tablet by mouth once daily 30 tablet 4  . pyridOXINE (VITAMIN B-6) 50 MG tablet Take 50 mg by mouth daily.    . VESICARE 5 MG tablet Take 5 mg by mouth daily.    Marland Kitchen warfarin (COUMADIN) 3 MG tablet Take 3-4.5 mg by mouth See admin instructions. Takes 3mg  everyday except Monday and Friday's takes 4.5mg      No current facility-administered medications on file prior to  visit.    Current Problems (verified) Patient Active Problem List   Diagnosis Date Noted  . CAP (community acquired pneumonia) 08/24/2013  . Prediabetes 08/15/2013  . Vitamin D deficiency 08/15/2013  . Medication management 08/15/2013  . Pacemaker 09/19/2012  . Atrial fibrillation 07/18/2012  . Long term (current) use of anticoagulants 07/18/2012  . Hyperlipidemia 09/03/2008  . Essential hypertension 09/03/2008  . ASCAD 09/03/2008  . GERD 09/03/2008  . FIBROCYSTIC BREAST DISEASE 09/03/2008  . DJD 09/03/2008    Screening Tests  Immunization History  Administered Date(s) Administered  . Influenza Split 05/04/2011  . Influenza, High Dose Seasonal PF 03/19/2014  . Influenza,inj,quad, With Preservative 05/21/2013  . Pneumococcal Conjugate-13 03/19/2014  . Pneumococcal Polysaccharide-23 05/04/2011  . Pneumococcal-Unspecified 05/03/2001  . Td 05/04/2003   Preventative care: Last colonoscopy: 2010 due 2020 Last mammogram: DUE- given information Last pap smear/pelvic exam: remote  DEXA: declines Cardiolite 2003 Echo 07/2014 Carotid doppler 2010  Prior vaccinations: TD or Tdap: 2005 due- declines right now  Influenza: 2015  Pneumococcal: 2012 Prevnar 13: 2015 Shingles/Zostavax: declines   Names of Other Physician/Practitioners you currently use: 1. [facility]- here for primary care 2. Dr. Katy Fitch,  eye doctor, last visit 2-3 years, has appt April Patient Care Team: [name], MD as PCP - General (Internal Medicine) [name], MD as Consulting Physician (Urology) Penni Bombard, MD as Consulting Physician (Neurology) [name], MD as Consulting Physician (Neurology) Sanda Klein, MD as Consulting Physician (Cardiology) [name], MD as Consulting Physician (Gastroenterology)   History reviewed: allergies, current medications, past family history, past medical history, past social history, past surgical  history and problem list Allergies  Allergen Reactions  . Ace Inhibitors     Unknown   . Acrylic Polymer [Carbomer]   . Augmentin [Amoxicillin-Pot Clavulanate]   . Chocolate Other (See Comments)    migraine's   . Ciprofloxacin   . Gabapentin Other (See Comments)    Makes her sleepy   . Levaquin [Levofloxacin In D5w]   . Zocor [Simvastatin]     Past Surgical History  Procedure Laterality Date  . Cardioversion  11/19/2009    successful DCCV from AF to sinus type rhythm  . Cardiac catheterization  08/06/2005    minimal coronary disease predominant RCA; no significant atherosclerosis; new onset sick sinus syndrome and atrial flutter w/ ventricular response, controlled on med therapy; systemic HTN, normal renal arteries  . Cholecystectomy    . Appendectomy    . Abdominal hysterectomy    . Skin cancer resection     Family History  Problem Relation Age of Onset  . Heart disease Mother   . Diabetes Mother   . Heart attack Father   . Heart disease Father   . Cirrhosis Brother    Tobacco History  Substance Use Topics  . Smoking status: Never Smoker   . Smokeless tobacco: Never Used  . Alcohol Use: No   MEDICARE WELLNESS OBJECTIVES: Tobacco use: She does not smoke.  Patient is not a former smoker. Alcohol Current alcohol use: none Caffeine Current caffeine use: coffee 1 /day Osteoporosis: dietary calcium and/or vitamin D deficiency, History of fracture in the past year: no Diet: in general, a "healthy" diet   Physical activity: ADLs Depression/mood screen:  Yes - Depression Hearing: impaired, has hearing aids Visual acuity: impaired,  does not perform annual eye exam  ADLs: self care Fall risk: Low Risk Home safety: good Cognitive Testing  Alert? Yes  Normal Appearance?Yes  Oriented to person? Yes  Place? Yes   Time? Yes  Recall of three objects?  Yes  Can perform simple calculations? Yes  Displays appropriate judgment?Yes  Can read the correct time from a watch  face?Yes EOL planning: Yes   Objective:   Blood pressure 120/70, pulse 76, temperature 97.7 F (36.5 C), resp. rate 16, height 5' 4.75" (1.645 m), weight 151 lb (68.493 kg). Body mass index is 25.31 kg/(m^2).  General appearance: alert, no distress, WD/WN,  female HEENT: normocephalic, sclerae anicteric, TMs pearly, nares patent, no discharge or erythema, pharynx normal Oral cavity: MMM, no lesions Neck: supple, no lymphadenopathy, no thyromegaly, no masses Heart: RRR, normal S1, S2, no murmurs Lungs: CTA bilaterally, no wheezes, rhonchi, or rales Abdomen: +bs, soft, non tender, non distended, no masses, no hepatomegaly, no splenomegaly Musculoskeletal: nontender, no swelling, no obvious deformity Extremities: no edema, no cyanosis, no clubbing Pulses: 2+ symmetric, upper and lower extremities, normal cap refill Neurological: alert, oriented x 3, CN2-12 intact, strength normal upper extremities and lower extremities, sensation normal throughout, DTRs 2+ throughout, no cerebellar signs, gait normal Psychiatric: normal affect, behavior normal, pleasant  Breast: defer Gyn: defer Rectal: defer  Medicare Attestation  I have personally reviewed: The patient's medical and social history Their use of alcohol, tobacco or illicit drugs Their current medications and supplements The patient's functional ability including ADLs,fall risks, home safety risks, cognitive, and hearing and visual impairment Diet and physical activities Evidence for depression or mood disorders  The patient's weight, height, BMI, and visual acuity have been recorded in the chart.  I have made referrals, counseling, and provided education to the patient based on review of the above and I have provided the patient with a written personalized care plan for preventive services.     Vicie Mutters, PA-C   07/17/2014

## 2014-07-18 LAB — LIPID PANEL
CHOL/HDL RATIO: 4.6 ratio
Cholesterol: 162 mg/dL (ref 0–200)
HDL: 35 mg/dL — ABNORMAL LOW (ref >=46)
LDL CALC: 104 mg/dL — AB (ref 0–99)
Triglycerides: 114 mg/dL (ref <150)
VLDL: 23 mg/dL (ref 0–40)

## 2014-07-18 LAB — BASIC METABOLIC PANEL WITH GFR
BUN: 12 mg/dL (ref 6–23)
CHLORIDE: 103 meq/L (ref 96–112)
CO2: 28 mEq/L (ref 19–32)
Calcium: 9.5 mg/dL (ref 8.4–10.5)
Creat: 0.76 mg/dL (ref 0.50–1.10)
GFR, EST NON AFRICAN AMERICAN: 72 mL/min
GFR, Est African American: 83 mL/min
Glucose, Bld: 88 mg/dL (ref 70–99)
Potassium: 4 mEq/L (ref 3.5–5.3)
SODIUM: 142 meq/L (ref 135–145)

## 2014-07-18 LAB — INSULIN, FASTING: Insulin fasting, serum: 6.5 u[IU]/mL (ref 2.0–19.6)

## 2014-07-18 LAB — HEPATIC FUNCTION PANEL
ALK PHOS: 140 U/L — AB (ref 39–117)
ALT: 14 U/L (ref 0–35)
AST: 16 U/L (ref 0–37)
Albumin: 4.2 g/dL (ref 3.5–5.2)
BILIRUBIN INDIRECT: 0.5 mg/dL (ref 0.2–1.2)
BILIRUBIN TOTAL: 0.6 mg/dL (ref 0.2–1.2)
Bilirubin, Direct: 0.1 mg/dL (ref 0.0–0.3)
Total Protein: 6.9 g/dL (ref 6.0–8.3)

## 2014-07-18 LAB — TSH: TSH: 1.414 u[IU]/mL (ref 0.350–4.500)

## 2014-07-18 LAB — HEMOGLOBIN A1C
HEMOGLOBIN A1C: 5.8 % — AB (ref <5.7)
MEAN PLASMA GLUCOSE: 120 mg/dL — AB (ref <117)

## 2014-07-18 LAB — MAGNESIUM: MAGNESIUM: 2.1 mg/dL (ref 1.5–2.5)

## 2014-07-18 LAB — VITAMIN D 25 HYDROXY (VIT D DEFICIENCY, FRACTURES): VIT D 25 HYDROXY: 43 ng/mL (ref 30–100)

## 2014-07-30 ENCOUNTER — Other Ambulatory Visit: Payer: Self-pay | Admitting: Physician Assistant

## 2014-08-09 ENCOUNTER — Ambulatory Visit (INDEPENDENT_AMBULATORY_CARE_PROVIDER_SITE_OTHER): Payer: Medicare Other | Admitting: Pharmacist Clinician (PhC)/ Clinical Pharmacy Specialist

## 2014-08-09 DIAGNOSIS — I4891 Unspecified atrial fibrillation: Secondary | ICD-10-CM | POA: Diagnosis not present

## 2014-08-09 DIAGNOSIS — Z7901 Long term (current) use of anticoagulants: Secondary | ICD-10-CM | POA: Diagnosis not present

## 2014-08-09 LAB — POCT INR: INR: 1.6

## 2014-08-16 ENCOUNTER — Encounter: Payer: Self-pay | Admitting: Internal Medicine

## 2014-08-23 ENCOUNTER — Ambulatory Visit (INDEPENDENT_AMBULATORY_CARE_PROVIDER_SITE_OTHER): Payer: Medicare Other | Admitting: Pharmacist Clinician (PhC)/ Clinical Pharmacy Specialist

## 2014-08-23 DIAGNOSIS — Z7901 Long term (current) use of anticoagulants: Secondary | ICD-10-CM | POA: Diagnosis not present

## 2014-08-23 DIAGNOSIS — I4891 Unspecified atrial fibrillation: Secondary | ICD-10-CM

## 2014-08-23 LAB — POCT INR: INR: 1.6

## 2014-08-29 ENCOUNTER — Other Ambulatory Visit: Payer: Self-pay | Admitting: Cardiovascular Disease

## 2014-08-29 NOTE — Telephone Encounter (Signed)
Rx(s) sent to pharmacy electronically.  

## 2014-09-11 ENCOUNTER — Ambulatory Visit (INDEPENDENT_AMBULATORY_CARE_PROVIDER_SITE_OTHER): Payer: Medicare Other | Admitting: Pharmacist Clinician (PhC)/ Clinical Pharmacy Specialist

## 2014-09-11 DIAGNOSIS — Z7901 Long term (current) use of anticoagulants: Secondary | ICD-10-CM

## 2014-09-11 DIAGNOSIS — I4891 Unspecified atrial fibrillation: Secondary | ICD-10-CM | POA: Diagnosis not present

## 2014-09-11 LAB — POCT INR: INR: 2.1

## 2014-09-19 ENCOUNTER — Ambulatory Visit (INDEPENDENT_AMBULATORY_CARE_PROVIDER_SITE_OTHER): Payer: Medicare Other | Admitting: Cardiovascular Disease

## 2014-09-19 ENCOUNTER — Encounter: Payer: Self-pay | Admitting: Cardiovascular Disease

## 2014-09-19 VITALS — BP 116/68 | HR 63 | Ht 64.0 in | Wt 149.0 lb

## 2014-09-19 DIAGNOSIS — I482 Chronic atrial fibrillation, unspecified: Secondary | ICD-10-CM

## 2014-09-19 DIAGNOSIS — I10 Essential (primary) hypertension: Secondary | ICD-10-CM

## 2014-09-19 DIAGNOSIS — R079 Chest pain, unspecified: Secondary | ICD-10-CM

## 2014-09-19 DIAGNOSIS — Z7901 Long term (current) use of anticoagulants: Secondary | ICD-10-CM

## 2014-09-19 NOTE — Patient Instructions (Signed)
Your physician has requested that you have a lexiscan myoview. For further information please visit [email or web address]. Please follow instruction sheet, as given.  Dr. Sallyanne Kuster recommends that you schedule a follow-up appointment in: 1st available for test results.

## 2014-09-20 NOTE — Progress Notes (Signed)
[name], female   DOB: [birth year], 81 y.o.   MRN: [id number]     Cardiology Office Note   Date:  09/20/2014   [name], DOB [birth year], MRN [id number]  PCP:  Alesia Richards, MD  Cardiologist:   Sanda Klein, MD   Chief Complaint  Patient presents with  . Follow-up    Patient has had SOB and chest pressure.      History of Present Illness: Misty Blackwell is a 81 y.o. female who presents for an episode of chest pain, as well as pacemaker follow-up, permanent atrial fibrillation on chronic warfarin anticoagulation, history of TIA, history of diastolic heart failure.  Misty Blackwell had an episode of chest pressure associated with diaphoresis("cold sweats") and dyspnea that occurred while she was shopping with her sister last Saturday. The symptoms improved after 5-10 minutes of rest but she felt unwell the rest of the day. She has not had any chest pressure since. However she is crying and is clearly is depressed. Her sleep has been very poor. She keeps recounting the events in the days preceding her husband's demise and feels that her problem is "rejection". She is yet to overcome the challenges surrounding surviving her spouse.  She has permanent atrial fibrillation and 100% ventricular pacing on today's electrocardiogram. Interrogation of her Medtronic Adapta pacemaker implanted in 2011 shows roughly 89% ventricular pacing due to atrial fibrillation with slow ventricular response. Estimated generator longevity 6.5 years. It has been only one episode of high ventricular rate that occurred on May 15 at 3 AM and lasted for only 10 beats. I think this was the day before her episode of chest pain.  She has not had any recent focal neurological events and denies bleeding problems. Her warfarin anticoagulation is in the therapeutic range.   Past Medical History  Diagnosis Date  . Coronary artery disease 03/19/2002    R/P Cardiolite - EF 76%; nromal static and  dynamic myocardial perfusion images; normal wall motion and endocardial thickening in all vascular territories  . Hypertension   . Atrial fib/flutter, transient   . Skin cancer     s/p surgical removal.  . Pacemaker   . TIA (transient ischemic attack)   . CHF (congestive heart failure) 10/29/2009    Echo - EF >55%; normal LV size and systolic function; unable to assess diastolic fcn due to E/A fusion, pulmonary vein flow pattern suggests elevated filling pressure; marked biatrail dilation, mild/mod tricuspid regurgitation; mod pulmonary htn; mild/mod mitral regurgitation; although echocardiographic features are incomplete findings suggest possible infiltrative cardiomyopathy (maybe amyloidosi  . Facial numbness 12/26/2008    carotid doppler - R and L ICAs 0-49% diameter reduction (velocities suggest low end of scale)  . Atrial fibrillation, chronic   . Peripheral neuropathy     Past Surgical History  Procedure Laterality Date  . Cardioversion  11/19/2009    successful DCCV from AF to sinus type rhythm  . Cardiac catheterization  08/06/2005    minimal coronary disease predominant RCA; no significant atherosclerosis; new onset sick sinus syndrome and atrial flutter w/ ventricular response, controlled on med therapy; systemic HTN, normal renal arteries  . Cholecystectomy    . Appendectomy    . Abdominal hysterectomy    . Skin cancer resection       Current Outpatient Prescriptions  Medication Sig Dispense Refill  . ALPRAZolam (XANAX) 1 MG tablet take 1/2 to 1 tablet by mouth twice a day 60 tablet 3  . aspirin  EC 81 MG tablet Take 81 mg by mouth daily.    . carvedilol (COREG) 3.125 MG tablet Take 1 tablet (3.125 mg total) by mouth 2 (two) times daily. 180 tablet 3  . cholecalciferol (VITAMIN D) 1000 UNITS tablet Take 1,000 Units by mouth 3 (three) times daily.     Marland Kitchen diltiazem (TIAZAC) 360 MG 24 hr capsule Take 1 capsule (360 mg total) by mouth daily. 30 capsule 6  . furosemide (LASIX) 40 MG  tablet Take 20 mg by mouth daily.    . furosemide (LASIX) 40 MG tablet take 1/2 tablet by mouth once daily if needed 30 tablet 5  . gabapentin (NEURONTIN) 300 MG capsule take 1 capsule by mouth twice a day 60 capsule 5  . glucosamine-chondroitin 500-400 MG tablet Take 1 tablet by mouth daily.    . Magnesium 250 MG TABS Take 1 tablet by mouth daily.    . potassium chloride SA (K-DUR,KLOR-CON) 20 MEQ tablet take 1 tablet by mouth once daily 30 tablet 4  . pyridOXINE (VITAMIN B-6) 50 MG tablet Take 50 mg by mouth daily.    . VESICARE 5 MG tablet Take 5 mg by mouth daily.    Marland Kitchen warfarin (COUMADIN) 3 MG tablet Take 3-4.5 mg by mouth See admin instructions. Takes 3mg  everyday except Monday and Friday's takes 4.5mg      No current facility-administered medications for this visit.    Allergies:   Ace inhibitors; Acrylic polymer; Augmentin; Chocolate; Ciprofloxacin; Gabapentin; Levaquin; and Zocor    Social History:  The patient  reports that she has never smoked. She has never used smokeless tobacco. She reports that she does not drink alcohol or use illicit drugs.   Family History:  The patient's family history includes Cirrhosis in her brother; Diabetes in her mother; Heart attack in her father; Heart disease in her father and mother.    ROS:  Please see the history of present illness.    Otherwise, review of systems positive for poor sleep, anhedonia, crying.   All other systems are reviewed and negative.    PHYSICAL EXAM: VS:  BP 116/68 mmHg  Pulse 63  Ht 5\' 4"  (1.626 m)  Wt 67.586 kg (149 lb)  BMI 25.56 kg/m2 , BMI Body mass index is 25.56 kg/(m^2).  General: Alert, oriented x3, no distress Head: no evidence of trauma, PERRL, EOMI, no exophtalmos or lid lag, no myxedema, no xanthelasma; normal ears, nose and oropharynx Neck: normal jugular venous pulsations and no hepatojugular reflux; brisk carotid pulses without delay and no carotid bruits Chest: clear to auscultation, no signs of  consolidation by percussion or palpation, normal fremitus, symmetrical and full respiratory excursions, healthy left subclavian pacemaker site Cardiovascular: normal position and quality of the apical impulse, regular rhythm, normal first and paradoxically split second heart sounds, no murmurs, rubs or gallops Abdomen: no tenderness or distention, no masses by palpation, no abnormal pulsatility or arterial bruits, normal bowel sounds, no hepatosplenomegaly Extremities: no clubbing, cyanosis or edema; 2+ radial, ulnar and brachial pulses bilaterally; 2+ right femoral, posterior tibial and dorsalis pedis pulses; 2+ left femoral, posterior tibial and dorsalis pedis pulses; no subclavian or femoral bruits Neurological: grossly nonfocal Psych: euthymic mood, full affect   EKG:  EKG is ordered today. The ekg ordered today demonstrates atrial fibrillation with 100% ventricular pacing   Recent Labs: 07/17/2014: ALT 14; BUN 12; Creatinine 0.76; Hemoglobin 14.2; Magnesium 2.1; Platelets 238; Potassium 4.0; Sodium 142; TSH 1.414    Lipid Panel  Component Value Date/Time   CHOL 162 07/17/2014 1039   TRIG 114 07/17/2014 1039   HDL 35* 07/17/2014 1039   CHOLHDL 4.6 07/17/2014 1039   VLDL 23 07/17/2014 1039   LDLCALC 104* 07/17/2014 1039      Wt Readings from Last 3 Encounters:  09/19/14 67.586 kg (149 lb)  07/17/14 68.493 kg (151 lb)  07/02/14 66.316 kg (146 lb 3.2 oz)      ASSESSMENT AND PLAN:  1. Chest pain - this occurred during light exertion and has a few hallmarks to suggest a coronary event. She has not had evaluation for coronary disease since a normal nuclear stress test in 2003. Her ECG is nondiagnostic due to ventricular pacing. I have recommended a Lexiscan Myoview. He is already taking aspirin in addition to warfarin and is on a low dose of beta blocker.  2. Permanent atrial fibrillation with slow ventricular response - rate control is excessive and her blood pressure is  excellent. Will recommend reducing the dose of rate control medications to reduce the need for ventricular pacing. Will wait for results of stress test first.  3. Probable nonsustained ventricular tachycardia. Episodes of high ventricular rates have been recorded in the past but the most recent one is more convincing for a ventricular arrhythmia rather than transmitted rapid atrial fibrillation. Continue beta blocker for now. Reevaluate depending on ischemic workup.  4. Systemic hypertension well controlled  5. Depression -  I think her current symptoms are clearly beyond simple morning and she may benefit from antidepressant medications or psychological evaluatio.  Current medicines are reviewed at length with the patient today.  The patient does not have concerns regarding medicines.  The following changes have been made:  no change  Labs/ tests ordered today include:  Orders Placed This Encounter  Procedures  . Myocardial Perfusion Imaging  . EKG 12-Lead    Patient Instructions  Your physician has requested that you have a lexiscan myoview. For further information please visit [email or web address]. Please follow instruction sheet, as given.  Dr. Sallyanne Kuster recommends that you schedule a follow-up appointment in: 1st available for test results.      Mikael Spray, MD  09/20/2014 1:24 PM    Sanda Klein, MD, [facility] [phone] office [phone] pager

## 2014-09-30 ENCOUNTER — Other Ambulatory Visit: Payer: Self-pay | Admitting: Pharmacist Clinician (PhC)/ Clinical Pharmacy Specialist

## 2014-10-01 ENCOUNTER — Telehealth (HOSPITAL_COMMUNITY): Payer: Self-pay

## 2014-10-01 NOTE — Telephone Encounter (Signed)
Encounter complete. 

## 2014-10-03 ENCOUNTER — Ambulatory Visit (HOSPITAL_COMMUNITY)
Admission: RE | Admit: 2014-10-03 | Discharge: 2014-10-03 | Disposition: A | Discharge status: Home / Self Care | Payer: Medicare Other | Source: Ambulatory Visit | Attending: Cardiovascular Disease | Admitting: Cardiovascular Disease

## 2014-10-03 DIAGNOSIS — R079 Chest pain, unspecified: Secondary | ICD-10-CM | POA: Diagnosis not present

## 2014-10-03 DIAGNOSIS — I10 Essential (primary) hypertension: Secondary | ICD-10-CM | POA: Diagnosis not present

## 2014-10-03 LAB — MYOCARDIAL PERFUSION IMAGING
CHL CUP NUCLEAR SSS: 4
CHL CUP STRESS STAGE 1 GRADE: 0 %
CHL CUP STRESS STAGE 2 SPEED: 0 mph
CHL CUP STRESS STAGE 3 DBP: 90 mmHg
CHL CUP STRESS STAGE 4 GRADE: 0 %
CHL CUP STRESS STAGE 4 HR: 60 {beats}/min
CSEPPBP: 145 mmHg
CSEPPHR: 64 {beats}/min
Estimated workload: 1 METS
LV sys vol: 31 mL
LVDIAVOL: 80 mL
NUC STRESS EF: 61 %
Percent of predicted max HR: 47 %
Rest HR: 61 {beats}/min
SDS: 0
SRS: 4
Stage 1 DBP: 87 mmHg
Stage 1 HR: 60 {beats}/min
Stage 1 SBP: 147 mmHg
Stage 1 Speed: 0 mph
Stage 2 Grade: 0 %
Stage 2 HR: 60 {beats}/min
Stage 3 Grade: 0 %
Stage 3 HR: 64 {beats}/min
Stage 3 SBP: 145 mmHg
Stage 3 Speed: 0 mph
Stage 4 DBP: 85 mmHg
Stage 4 SBP: 133 mmHg
Stage 4 Speed: 0 mph
TID: 1.04

## 2014-10-03 MED ORDER — TECHNETIUM TC 99M SESTAMIBI GENERIC - CARDIOLITE
10.5000 | Freq: Once | INTRAVENOUS | Status: AC | PRN
  Administered 2014-10-03: 11 via INTRAVENOUS

## 2014-10-03 MED ORDER — TECHNETIUM TC 99M SESTAMIBI GENERIC - CARDIOLITE
31.9000 | Freq: Once | INTRAVENOUS | Status: AC | PRN
  Administered 2014-10-03: 31.9 via INTRAVENOUS

## 2014-10-03 MED ORDER — REGADENOSON 0.4 MG/5ML IV SOLN
0.4000 mg | Freq: Once | INTRAVENOUS | Status: AC
  Administered 2014-10-03: 0.4 mg via INTRAVENOUS

## 2014-10-03 MED ORDER — AMINOPHYLLINE 25 MG/ML IV SOLN
75.0000 mg | Freq: Once | INTRAVENOUS | Status: AC
  Administered 2014-10-03: 75 mg via INTRAVENOUS

## 2014-10-07 LAB — CUP PACEART INCLINIC DEVICE CHECK
Battery Impedance: 561 Ohm
Battery Remaining Longevity: 81 mo
Battery Voltage: 2.79 V
Date Time Interrogation Session: 20160519170250
Lead Channel Impedance Value: 67 Ohm
Lead Channel Pacing Threshold Pulse Width: 0.4 ms
Lead Channel Setting Pacing Amplitude: 2.5 V
Lead Channel Setting Pacing Pulse Width: 0.4 ms
Lead Channel Setting Sensing Sensitivity: 5.6 mV
MDC IDC MSMT LEADCHNL RV IMPEDANCE VALUE: 712 Ohm
MDC IDC MSMT LEADCHNL RV PACING THRESHOLD AMPLITUDE: 0.5 V
MDC IDC STAT BRADY RV PERCENT PACED: 89 %

## 2014-10-08 ENCOUNTER — Encounter: Payer: Self-pay | Admitting: Cardiovascular Disease

## 2014-10-08 ENCOUNTER — Ambulatory Visit (INDEPENDENT_AMBULATORY_CARE_PROVIDER_SITE_OTHER): Payer: Medicare Other

## 2014-10-08 ENCOUNTER — Ambulatory Visit (INDEPENDENT_AMBULATORY_CARE_PROVIDER_SITE_OTHER): Payer: Medicare Other | Admitting: Cardiovascular Disease

## 2014-10-08 VITALS — BP 120/76 | HR 89 | Ht 64.5 in | Wt 152.0 lb

## 2014-10-08 DIAGNOSIS — I482 Chronic atrial fibrillation, unspecified: Secondary | ICD-10-CM

## 2014-10-08 DIAGNOSIS — I10 Essential (primary) hypertension: Secondary | ICD-10-CM | POA: Diagnosis not present

## 2014-10-08 DIAGNOSIS — I4891 Unspecified atrial fibrillation: Secondary | ICD-10-CM | POA: Diagnosis not present

## 2014-10-08 DIAGNOSIS — Z7901 Long term (current) use of anticoagulants: Secondary | ICD-10-CM | POA: Diagnosis not present

## 2014-10-08 DIAGNOSIS — Z950 Presence of cardiac pacemaker: Secondary | ICD-10-CM

## 2014-10-08 DIAGNOSIS — E782 Mixed hyperlipidemia: Secondary | ICD-10-CM

## 2014-10-08 LAB — CUP PACEART INCLINIC DEVICE CHECK
Battery Remaining Longevity: 6.5
Battery Voltage: 2.79 V
Lead Channel Impedance Value: 694 Ohm
Lead Channel Pacing Threshold Amplitude: 0.5 V
Lead Channel Setting Sensing Sensitivity: 5.6 mV
MDC IDC MSMT BATTERY IMPEDANCE: 561 Ohm
MDC IDC MSMT LEADCHNL RV PACING THRESHOLD PULSEWIDTH: 0.4 ms
MDC IDC SESS DTM: 20160607142701
MDC IDC SET LEADCHNL RV PACING AMPLITUDE: 2.5 V
MDC IDC SET LEADCHNL RV PACING PULSEWIDTH: 0.4 ms
MDC IDC STAT BRADY RV PERCENT PACED: 89.5 %

## 2014-10-08 LAB — POCT INR: INR: 2.3

## 2014-10-08 MED ORDER — DILTIAZEM HCL ER BEADS 240 MG PO CP24: 240.0000 mg | ORAL_CAPSULE | Freq: Every day | ORAL | Status: DC

## 2014-10-08 NOTE — Progress Notes (Signed)
[name], female   DOB: 11-02-1927, 81 y.o.   MRN: [id number]      Cardiology Office Note   Date:  10/08/2014   [name], DOB 1928-04-24, MRN [id number]  PCP:  Alesia Richards, MD  Cardiologist:  Sanda Klein, MD   Chief Complaint  Patient presents with  . Follow-up    stress test. patient reports no new complaints      History of Present Illness: Misty Blackwell is a 81 y.o. female who presents for Follow-up after undergoing a stress test. She feels much better. I think this is partly due to the reassurance from the normal test, but also since she is now receiving counseling. She has a lot of unresolved issues regarding relationships with family members and her recently deceased husband.  Her chest discomfort has essentially resolved. No bleeding or neurological events while on anticoagulation with warfarin. She does have a remote history of transient ischemic attack as well as diastolic heart failure. She has normal left ventricular systolic function and no evidence of coronary artery disease by previous testing.  Interrogation of her pacemaker today shows normal device function with roughly 90% ventricular pacing on a background of permanent atrial fibrillation. A single 10 beat run of nonsustained ventricular tachycardia occurred on May 15 and was asymptomatic. Generator longevity on her [employer] Adapta device (implanted 2011) is estimated at 6.5 years.    Past Medical History  Diagnosis Date  . Coronary artery disease 03/19/2002    R/P Cardiolite - EF 76%; nromal static and dynamic myocardial perfusion images; normal wall motion and endocardial thickening in all vascular territories  . Hypertension   . Atrial fib/flutter, transient   . Skin cancer     s/p surgical removal.  . Pacemaker   . TIA (transient ischemic attack)   . 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 Facial numbness 12/26/2008    carotid doppler - R and L ICAs 0-49% diameter reduction (velocities suggest low end of scale)  . Atrial fibrillation, chronic   . Peripheral neuropathy     Past Surgical History  Procedure Laterality Date  . Cardioversion  11/19/2009    successful DCCV from AF to sinus type rhythm  . Cardiac catheterization  08/06/2005    minimal coronary disease predominant RCA; no significant atherosclerosis; new onset sick sinus syndrome and atrial flutter w/ ventricular response, controlled on med therapy; systemic HTN, normal renal arteries  . Cholecystectomy    . Appendectomy    . Abdominal hysterectomy    . Skin cancer resection       Current Outpatient Prescriptions  Medication Sig Dispense Refill  . ALPRAZolam (XANAX) 1 MG tablet take 1/2 to 1 tablet by mouth twice a day 60 tablet 3  . aspirin EC 81 MG tablet Take 81 mg by mouth daily.    . carvedilol (COREG) 3.125 MG tablet Take 1 tablet (3.125 mg total) by mouth 2 (two) times daily. 180 tablet 3  . cholecalciferol (VITAMIN D) 1000 UNITS tablet Take 1,000 Units by mouth 3 (three) times daily.     Marland Kitchen diltiazem (TIAZAC) 240 MG 24 hr capsule Take 1 capsule (240 mg  total) by mouth daily. 30 capsule 11  . furosemide (LASIX) 40 MG tablet Take 20 mg by mouth daily.    . furosemide (LASIX) 40 MG tablet take 1/2 tablet by mouth once daily if needed 30 tablet 5  . gabapentin (NEURONTIN) 300 MG capsule take 1 capsule by mouth twice a day 60 capsule 5  . glucosamine-chondroitin 500-400 MG tablet Take 1 tablet by mouth daily.    . Magnesium 250 MG TABS Take 1 tablet by mouth daily.    . potassium  chloride SA (K-DUR,KLOR-CON) 20 MEQ tablet take 1 tablet by mouth once daily 30 tablet 4  . pyridOXINE (VITAMIN B-6) 50 MG tablet Take 50 mg by mouth daily.    . VESICARE 5 MG tablet Take 5 mg by mouth daily.    Marland Kitchen warfarin (COUMADIN) 3 MG tablet Take 3-4.5 mg by mouth See admin instructions. Takes 3mg  everyday except Monday and Friday's takes 4.5mg     . warfarin (COUMADIN) 3 MG tablet take 1 to 1 and 1/2 tablets by mouth as directed 45 tablet 5   No current facility-administered medications for this visit.    Allergies:   Ace inhibitors; Acrylic polymer; Augmentin; Chocolate; Ciprofloxacin; Gabapentin; Levaquin; and Zocor    Social History:  The patient  reports that she has never smoked. She has never used smokeless tobacco. She reports that she does not drink alcohol or use illicit drugs.   Family History:  The patient's family history includes Cirrhosis in her brother; Diabetes in her mother; Heart attack in her father; Heart disease in her father and mother.    ROS:  Please see the history of present illness.    Otherwise, review of systems positive for none.   All other systems are reviewed and negative.    PHYSICAL EXAM: VS:  BP 120/76 mmHg  Pulse 89  Ht 5' 4.5" (1.638 m)  Wt 68.947 kg (152 lb)  BMI 25.70 kg/m2 , BMI Body mass index is 25.7 kg/(m^2).  General: Alert, oriented x3, no distress Head: no evidence of trauma, PERRL, EOMI, no exophtalmos or lid lag, no myxedema, no xanthelasma; normal ears, nose and oropharynx Neck: normal jugular venous pulsations and no hepatojugular reflux; brisk carotid pulses without delay and no carotid bruits Chest: clear to auscultation, no signs of consolidation by percussion or palpation, normal fremitus, symmetrical and full respiratory excursions, healthy left subclavian pacemaker site Cardiovascular: normal position and quality of the apical impulse, regular rhythm, normal first and paradoxically split second heart sounds, no murmurs,  rubs or gallops Abdomen: no tenderness or distention, no masses by palpation, no abnormal pulsatility or arterial bruits, normal bowel sounds, no hepatosplenomegaly Extremities: no clubbing, cyanosis or edema; 2+ radial, ulnar and brachial pulses bilaterally; 2+ right femoral, posterior tibial and dorsalis pedis pulses; 2+ left femoral, posterior tibial and dorsalis pedis pulses; no subclavian or femoral bruits Neurological: grossly nonfocal   EKG:  EKG is not ordered today.   Recent Labs: 07/17/2014: ALT 14; BUN 12; Creatinine 0.76; Hemoglobin 14.2; Magnesium 2.1; Platelets 238; Potassium 4.0; Sodium 142; TSH 1.414    Lipid Panel    Component Value Date/Time   CHOL 162 07/17/2014 1039   TRIG 114 07/17/2014 1039   HDL 35* 07/17/2014 1039   CHOLHDL 4.6 07/17/2014 1039   VLDL 23 07/17/2014 1039   LDLCALC 104* 07/17/2014 1039      Wt Readings from Last 3 Encounters:  10/08/14 68.947 kg (152 lb)  10/03/14 67.586 kg (149 lb)  09/19/14 67.586  kg (149 lb)     ASSESSMENT AND PLAN:  1. Chest pain - normal Lexiscan Myoview.   2. Permanent atrial fibrillation with slow ventricular response - rate control is excessive and her blood pressure is excellent. Will cut back on the diltiazem dose to reduce the need for ventricular pacing.   3. Nonsustained ventricular tachycardia. Continue low-dose beta blocker.  4. Systemic hypertension - well controlled  5. Depression -symptoms appear improved or at least stabilized since she started receiving counseling  Current medicines are reviewed at length with the patient today.  The patient does not have concerns regarding medicines.  The following changes have been made:  Reduce diltiazem to 240 mg daily  Labs/ tests ordered today include:  Orders Placed This Encounter  Procedures  . Implantable device check    Patient Instructions  Medication Instructions:   DECREASE Diltiazem to 240mg  daily.   Follow-Up:  3 months with pacemaker  check (MDT - GRAY).       Mikael Spray, MD  10/08/2014 4:27 PM    Sanda Klein, MD, [facility] [phone] office [phone] pager

## 2014-10-08 NOTE — Patient Instructions (Signed)
Medication Instructions:   DECREASE Diltiazem to 240mg  daily.   Follow-Up:  3 months with pacemaker check (MDT - GRAY).

## 2014-10-10 ENCOUNTER — Encounter: Payer: Self-pay | Admitting: Cardiovascular Disease

## 2014-10-25 ENCOUNTER — Encounter: Payer: Self-pay | Admitting: Cardiovascular Disease

## 2014-11-01 ENCOUNTER — Encounter: Payer: Self-pay | Admitting: Internal Medicine

## 2014-11-05 ENCOUNTER — Ambulatory Visit (INDEPENDENT_AMBULATORY_CARE_PROVIDER_SITE_OTHER): Payer: Medicare Other | Admitting: *Deleted

## 2014-11-05 DIAGNOSIS — I482 Chronic atrial fibrillation, unspecified: Secondary | ICD-10-CM

## 2014-11-05 DIAGNOSIS — Z7901 Long term (current) use of anticoagulants: Secondary | ICD-10-CM

## 2014-11-05 DIAGNOSIS — I4891 Unspecified atrial fibrillation: Secondary | ICD-10-CM | POA: Diagnosis not present

## 2014-11-05 LAB — POCT INR: INR: 2.7

## 2014-12-01 NOTE — Patient Instructions (Signed)
Recommend Adult Low dose Aspirin or coated  Aspirin 81 mg daily   To reduce risk of Colon Cancer 20 %,   Skin Cancer 26 % ,   Melanoma 46%   and   Pancreatic cancer 60% ++++++++++++++++++ Vitamin D goal is between 70-100.   Please make sure that you are taking your Vitamin D as directed.   It is very important as a natural anti-inflammatory   helping hair, skin, and nails, as well as reducing stroke and heart attack risk.   It helps your bones and helps with mood.  It also decreases numerous cancer risks so please take it as directed.   Low Vit D is associated with a 200-300% higher risk for CANCER   and 200-300% higher risk for HEART   ATTACK  &  STROKE.   ......................................  It is also associated with higher death rate at younger ages,   autoimmune diseases like Rheumatoid arthritis, Lupus, Multiple Sclerosis.     Also many other serious conditions, like depression, Alzheimer's  Dementia, infertility, muscle aches, fatigue, fibromyalgia - just to name a few.  +++++++++++++++++++  Recommend the book "The END of DIETING" by Dr Joel Fuhrman   & the book "The END of DIABETES " by Dr Joel Fuhrman  At [employer] - get book & Audio CD's     Being diabetic has a  300% increased risk for heart attack, stroke, cancer, and alzheimer- type vascular dementia. It is very important that you work harder with diet by avoiding all foods that are white. Avoid white rice (brown & wild rice is OK), white potatoes (sweetpotatoes in moderation is OK), White bread or wheat bread or anything made out of white flour like bagels, donuts, rolls, buns, biscuits, cakes, pastries, cookies, pizza crust, and pasta (made from white flour & egg whites) - vegetarian pasta or spinach or wheat pasta is OK. Multigrain breads like Arnold's or [employer], or multigrain sandwich thins or flatbreads.  Diet, exercise and weight loss can reverse and cure diabetes in the early stages.   Diet, exercise and weight loss is very important in the control and prevention of complications of diabetes which affects every system in your body, ie. Brain - dementia/stroke, eyes - glaucoma/blindness, heart - heart attack/heart failure, kidneys - dialysis, stomach - gastric paralysis, intestines - malabsorption, nerves - severe painful neuritis, circulation - gangrene & loss of a leg(s), and finally cancer and Alzheimers.    I recommend avoid fried & greasy foods,  sweets/candy, white rice (brown or wild rice or Quinoa is OK), white potatoes (sweet potatoes are OK) - anything made from white flour - bagels, doughnuts, rolls, buns, biscuits,white and wheat breads, pizza crust and traditional pasta made of white flour & egg white(vegetarian pasta or spinach or wheat pasta is OK).  Multi-grain bread is OK - like multi-grain flat bread or sandwich thins. Avoid alcohol in excess. Exercise is also important.    Eat all the vegetables you want - avoid meat, especially red meat and dairy - especially cheese.  Cheese is the most concentrated form of trans-fats which is the worst thing to clog up our arteries. Veggie cheese is OK which can be found in the fresh produce section at [employer] or [employer] or [employer]  ++++++++++++++++++++++++++   Preventive Care for Adults  A healthy lifestyle and preventive care can promote health and wellness. Preventive health guidelines for women include the following key practices.  A routine yearly physical is a good way   to check with your health care provider about your health and preventive screening. It is a chance to share any concerns and updates on your health and to receive a thorough exam.  Visit your dentist for a routine exam and preventive care every 6 months. Brush your teeth twice a day and floss once a day. Good oral hygiene prevents tooth decay and gum disease.  The frequency of eye exams is based on your age, health, family medical history, use  of contact lenses, and other factors. Follow your health care provider's recommendations for frequency of eye exams.  Eat a healthy diet. Foods like vegetables, fruits, whole grains, low-fat dairy products, and lean protein foods contain the nutrients you need without too many calories. Decrease your intake of foods high in solid fats, added sugars, and salt. Eat the right amount of calories for you.Get information about a proper diet from your health care provider, if necessary.  Regular physical exercise is one of the most important things you can do for your health. Most adults should get at least 150 minutes of moderate-intensity exercise (any activity that increases your heart rate and causes you to sweat) each week. In addition, most adults need muscle-strengthening exercises on 2 or more days a week.  Maintain a healthy weight. The body mass index (BMI) is a screening tool to identify possible weight problems. It provides an estimate of body fat based on height and weight. Your health care provider can find your BMI and can help you achieve or maintain a healthy weight.For adults 20 years and older:  A BMI below 18.5 is considered underweight.  A BMI of 18.5 to 24.9 is normal.  A BMI of 25 to 29.9 is considered overweight.  A BMI of 30 and above is considered obese.  Maintain normal blood lipids and cholesterol levels by exercising and minimizing your intake of saturated fat. Eat a balanced diet with plenty of fruit and vegetables. If your lipid or cholesterol levels are high, you are over 50, or you are at high risk for heart disease, you may need your cholesterol levels checked more frequently.Ongoing high lipid and cholesterol levels should be treated with medicines if diet and exercise are not working.  If you smoke, find out from your health care provider how to quit. If you do not use tobacco, do not start.  Lung cancer screening is recommended for adults aged 17-80 years who are  at high risk for developing lung cancer because of a history of smoking. A yearly low-dose CT scan of the lungs is recommended for people who have at least a 30-pack-year history of smoking and are a current smoker or have quit within the past 15 years. A pack year of smoking is smoking an average of 1 pack of cigarettes a day for 1 year (for example: 1 pack a day for 30 years or 2 packs a day for 15 years). Yearly screening should continue until the smoker has stopped smoking for at least 15 years. Yearly screening should be stopped for people who develop a health problem that would prevent them from having lung cancer treatment.  Avoid use of street drugs. Do not share needles with anyone. Ask for help if you need support or instructions about stopping the use of drugs.  High blood pressure causes heart disease and increases the risk of stroke.  Ongoing high blood pressure should be treated with medicines if weight loss and exercise do not work.  If you are 55-79  years old, ask your health care provider if you should take aspirin to prevent strokes.  Diabetes screening involves taking a blood sample to check your fasting blood sugar level. This should be done once every 3 years, after age 3, if you are within normal weight and without risk factors for diabetes. Testing should be considered at a younger age or be carried out more frequently if you are overweight and have at least 1 risk factor for diabetes.  Breast cancer screening is essential preventive care for women. You should practice "breast self-awareness." This means understanding the normal appearance and feel of your breasts and may include breast self-examination. Any changes detected, no matter how small, should be reported to a health care provider. Women in their 10s and 30s should have a clinical breast exam (CBE) by a health care provider as part of a regular health exam every 1 to 3 years. After age 67, women should have a CBE every  year. Starting at age 66, women should consider having a mammogram (breast X-ray test) every year. Women who have a family history of breast cancer should talk to their health care provider about genetic screening. Women at a high risk of breast cancer should talk to their health care providers about having an MRI and a mammogram every year.  Breast cancer gene (BRCA)-related cancer risk assessment is recommended for women who have family members with BRCA-related cancers. BRCA-related cancers include breast, ovarian, tubal, and peritoneal cancers. Having family members with these cancers may be associated with an increased risk for harmful changes (mutations) in the breast cancer genes BRCA1 and BRCA2. Results of the assessment will determine the need for genetic counseling and BRCA1 and BRCA2 testing.  Routine pelvic exams to screen for cancer are no longer recommended for nonpregnant women who are considered low risk for cancer of the pelvic organs (ovaries, uterus, and vagina) and who do not have symptoms. Ask your health care provider if a screening pelvic exam is right for you.  If you have had past treatment for cervical cancer or a condition that could lead to cancer, you need Pap tests and screening for cancer for at least 20 years after your treatment. If Pap tests have been discontinued, your risk factors (such as having a new sexual partner) need to be reassessed to determine if screening should be resumed. Some women have medical problems that increase the chance of getting cervical cancer. In these cases, your health care provider may recommend more frequent screening and Pap tests.    Colorectal cancer can be detected and often prevented. Most routine colorectal cancer screening begins at the age of 97 years and continues through age 68 years. However, your health care provider may recommend screening at an earlier age if you have risk factors for colon cancer. On a yearly basis, your health  care provider may provide home test kits to check for hidden blood in the stool. Use of a small camera at the end of a tube, to directly examine the colon (sigmoidoscopy or colonoscopy), can detect the earliest forms of colorectal cancer. Talk to your health care provider about this at age 65, when routine screening begins. Direct exam of the colon should be repeated every 5-10 years through age 81 years, unless early forms of pre-cancerous polyps or small growths are found.  Osteoporosis is a disease in which the bones lose minerals and strength with aging. This can result in serious bone fractures or breaks. The risk of osteoporosis  can be identified using a bone density scan. Women ages 13 years and over and women at risk for fractures or osteoporosis should discuss screening with their health care providers. Ask your health care provider whether you should take a calcium supplement or vitamin D to reduce the rate of osteoporosis.  Menopause can be associated with physical symptoms and risks. Hormone replacement therapy is available to decrease symptoms and risks. You should talk to your health care provider about whether hormone replacement therapy is right for you.  Use sunscreen. Apply sunscreen liberally and repeatedly throughout the day. You should seek shade when your shadow is shorter than you. Protect yourself by wearing long sleeves, pants, a wide-brimmed hat, and sunglasses year round, whenever you are outdoors.  Once a month, do a whole body skin exam, using a mirror to look at the skin on your back. Tell your health care provider of new moles, moles that have irregular borders, moles that are larger than a pencil eraser, or moles that have changed in shape or color.  Stay current with required vaccines (immunizations).  Influenza vaccine. All adults should be immunized every year.  Tetanus, diphtheria, and acellular pertussis (Td, Tdap) vaccine. Pregnant women should receive 1 dose of  Tdap vaccine during each pregnancy. The dose should be obtained regardless of the length of time since the last dose. Immunization is preferred during the 27th-36th week of gestation. An adult who has not previously received Tdap or who does not know her vaccine status should receive 1 dose of Tdap. This initial dose should be followed by tetanus and diphtheria toxoids (Td) booster doses every 10 years. Adults with an unknown or incomplete history of completing a 3-dose immunization series with Td-containing vaccines should begin or complete a primary immunization series including a Tdap dose. Adults should receive a Td booster every 10 years.    Zoster vaccine. One dose is recommended for adults aged 35 years or older unless certain conditions are present.    Pneumococcal 13-valent conjugate (PCV13) vaccine. When indicated, a person who is uncertain of her immunization history and has no record of immunization should receive the PCV13 vaccine. An adult aged 71 years or older who has certain medical conditions and has not been previously immunized should receive 1 dose of PCV13 vaccine. This PCV13 should be followed with a dose of pneumococcal polysaccharide (PPSV23) vaccine. The PPSV23 vaccine dose should be obtained at least 8 weeks after the dose of PCV13 vaccine. An adult aged 63 years or older who has certain medical conditions and previously received 1 or more doses of PPSV23 vaccine should receive 1 dose of PCV13. The PCV13 vaccine dose should be obtained 1 or more years after the last PPSV23 vaccine dose.    Pneumococcal polysaccharide (PPSV23) vaccine. When PCV13 is also indicated, PCV13 should be obtained first. All adults aged 40 years and older should be immunized. An adult younger than age 62 years who has certain medical conditions should be immunized. Any person who resides in a nursing home or long-term care facility should be immunized. An adult smoker should be immunized. People with an  immunocompromised condition and certain other conditions should receive both PCV13 and PPSV23 vaccines. People with human immunodeficiency virus (HIV) infection should be immunized as soon as possible after diagnosis. Immunization during chemotherapy or radiation therapy should be avoided. Routine use of PPSV23 vaccine is not recommended for American Indians, [place]atives, or people younger than 65 years unless there are medical conditions that require  PPSV23 vaccine. When indicated, people who have unknown immunization and have no record of immunization should receive PPSV23 vaccine. One-time revaccination 5 years after the first dose of PPSV23 is recommended for people aged 19-64 years who have chronic kidney failure, nephrotic syndrome, asplenia, or immunocompromised conditions. People who received 1-2 doses of PPSV23 before age 6 years should receive another dose of PPSV23 vaccine at age 81 years or later if at least 5 years have passed since the previous dose. Doses of PPSV23 are not needed for people immunized with PPSV23 at or after age 25 years.   Preventive Services / Frequency  Ages 65 years and over  Blood pressure check.  Lipid and cholesterol check.  Lung cancer screening. / Every year if you are aged 72-80 years and have a 30-pack-year history of smoking and currently smoke or have quit within the past 15 years. Yearly screening is stopped once you have quit smoking for at least 15 years or develop a health problem that would prevent you from having lung cancer treatment.  Clinical breast exam.** / Every year after age 28 years.  BRCA-related cancer risk assessment.** / For women who have family members with a BRCA-related cancer (breast, ovarian, tubal, or peritoneal cancers).  Mammogram.** / Every year beginning at age 67 years and continuing for as long as you are in good health. Consult with your health care provider.  Pap test.** / Every 3 years starting at age 44 years  through age 38 or 31 years with 3 consecutive normal Pap tests. Testing can be stopped between 65 and 70 years with 3 consecutive normal Pap tests and no abnormal Pap or HPV tests in the past 10 years.  Fecal occult blood test (FOBT) of stool. / Every year beginning at age 97 years and continuing until age 31 years. You may not need to do this test if you get a colonoscopy every 10 years.  Flexible sigmoidoscopy or colonoscopy.** / Every 5 years for a flexible sigmoidoscopy or every 10 years for a colonoscopy beginning at age 3 years and continuing until age 60 years.  Hepatitis C blood test.** / For all people born from [birth year] through 1963 and any individual with known risks for hepatitis C.  Osteoporosis screening.** / A one-time screening for women ages 44 years and over and women at risk for fractures or osteoporosis.  Skin self-exam. / Monthly.  Influenza vaccine. / Every year.  Tetanus, diphtheria, and acellular pertussis (Tdap/Td) vaccine.** / 1 dose of Td every 10 years.  Zoster vaccine.** / 1 dose for adults aged 72 years or older.  Pneumococcal 13-valent conjugate (PCV13) vaccine.** / Consult your health care provider.  Pneumococcal polysaccharide (PPSV23) vaccine.** / 1 dose for all adults aged 64 years and older. Screening for abdominal aortic aneurysm (AAA)  by ultrasound is recommended for people who have history of high blood pressure or who are current or former smokers.

## 2014-12-01 NOTE — Progress Notes (Signed)
[name], female   DOB: 02-03-1928, 81 y.o.   MRN: [id number]   Comprehensive Examination  This very nice 81 y.o. [facility] presents for complete physical.  Patient has been followed for HTN, ASHD/SSS/pAfib,Prediabetes/Insulin Resistance, Hyperlipidemia, and Vitamin D Deficiency.    HTN predates since 1998. Patient had Nl cath's in 1993 & 2007 and in 2002 she had presented with rapid Afib. In 2003 he had a Negative Cardiolite. Then in 2006 she had a TIA w/Rt facial paresthesias. In 2007 she had a permanent Pacer inserted for SSS. She is followed by Dr Sallyanne Kuster. Patient's BP has been controlled at home and patient denies any cardiac symptoms as chest pain, palpitations, shortness of breath, dizziness or ankle swelling. Today's BP is 142/86.    Patient's hyperlipidemia is near controlled with diet (hx/o intolerance w/Simvaststin).  Last lipids were Cholesterol 162; HDL 35*; LDL 104*; Triglycerides 114 on 07/17/2014.   Patient has prediabetes & insulin resistance with A1c 5.9% & elevated Insulin 86 ( Nl<20) in 2011 and patient denies reactive hypoglycemic symptoms, visual blurring, diabetic polys, or paresthesias. Last A1c was 5.8% on 07/17/2014.      Finally, patient has history of Vitamin D Deficiency of 13 in 2008 and last Vitamin D was still low at 43 on 07/17/2014.      Medication Sig  . ALPRAZolam  1 MG tablet take 1/2 to 1 tablet by mouth twice a day  . aspirin EC 81 MG  Take 81 mg by mouth daily.  . carvedilol  3.125 MG tablet Take 1 tablet (3.125 mg total) by mouth 2 (two) times daily.  Marland Kitchen VITAMIN D 1000 UNITS tablet Take 1,000 Units by mouth 3 (three) times daily.   Marland Kitchen diltiazem  240 MG 24 hr cap Take 1 capsule (240 mg total) by mouth daily.  . furosemide  40 MG tablet take 1/2 tablet by mouth once daily if needed  . gabapentin 300 MG capsule take 1 capsule by mouth twice a day  . glucosamine-chondroitin 500-400 MG Take 1 tablet by mouth daily.  . Magnesium 250 MG TABS Take 1  tablet by mouth daily.  . potassium chloride SA  20 MEQ  take 1 tablet by mouth once daily  . pyridOXINE B-6 -  50 MG tablet Take 50 mg by mouth daily.  . VESICARE 5 MG tablet Take 5 mg by mouth daily.  Marland Kitchen warfarin (COUMADIN) 3 MG tablet  Takes 3mg  everyday except Monday and Friday's takes 4.5mg   . warfarin (COUMADIN) 3 MG tablet take 1 to 1 and 1/2 tablets by mouth as directed   Allergies  Allergen Reactions  . Ace Inhibitors     Unknown   . Acrylic Polymer [Carbomer]   . Augmentin [Amoxicillin-Pot Clavulanate]   . Chocolate Other (See Comments)    migraine's   . Ciprofloxacin   . Gabapentin Other (See Comments)    Makes her sleepy   . Levaquin [Levofloxacin In D5w]   . Zocor [Simvastatin]    Past Medical History  Diagnosis Date  . Coronary artery disease 03/19/2002    R/P Cardiolite - EF 76%; nromal static and dynamic myocardial perfusion images; normal wall motion and endocardial thickening in all vascular territories  . Hypertension   . Atrial fib/flutter, transient   . Skin cancer     s/p surgical removal.  . Pacemaker   . TIA (transient ischemic attack)   . CHF (congestive heart failure) 10/29/2009    Echo - EF >55%; normal LV  size and systolic function; unable to assess diastolic fcn due to E/A fusion, pulmonary vein flow pattern suggests elevated filling pressure; marked biatrail dilation, mild/mod tricuspid regurgitation; mod pulmonary htn; mild/mod mitral regurgitation; although echocardiographic features are incomplete findings suggest possible infiltrative cardiomyopathy (maybe amyloidosi  . Facial numbness 12/26/2008    carotid doppler - R and L ICAs 0-49% diameter reduction (velocities suggest low end of scale)  . Atrial fibrillation, chronic   . Peripheral neuropathy    Immunization History  Administered Date(s) Administered  . Influenza Split 05/04/2011  . Influenza, High Dose Seasonal PF 03/19/2014  . Influenza,inj,quad, With Preservative 05/21/2013  .  Pneumococcal Conjugate-13 03/19/2014  . Pneumococcal Polysaccharide-23 05/04/2011  . Pneumococcal-Unspecified 05/03/2001  . Td 05/04/2003   Past Surgical History  Procedure Laterality Date   Colonoscopy - Dr Deatra Ina - recommended 10 yr f/u.  2010  . Cardioversion  11/19/2009    successful DCCV from AF to sinus type rhythm  . Cardiac catheterization  08/06/2005    minimal coronary disease predominant RCA; no significant atherosclerosis; new onset sick sinus syndrome and atrial flutter w/ ventricular response, controlled on med therapy; systemic HTN, normal renal arteries  . Cholecystectomy    . Appendectomy    . Abdominal hysterectomy    . Skin cancer resection     Family History  Problem Relation Age of Onset  . Heart disease Mother   . Diabetes Mother   . Heart attack Father   . Heart disease Father   . Cirrhosis Brother    History  Substance Use Topics  . Smoking status: Never Smoker   . Smokeless tobacco: Never Used  . Alcohol Use: No    ROS Constitutional: Denies fever, chills, weight loss/gain, headaches, insomnia,  night sweats, and change in appetite. Does c/o fatigue. Eyes: Denies redness, blurred vision, diplopia, discharge, itchy, watery eyes.  ENT: Denies discharge, congestion, post nasal drip, epistaxis, sore throat, earache, hearing loss, dental pain, Tinnitus, Vertigo, Sinus pain, snoring.  Cardio: Denies chest pain, palpitations, irregular heartbeat, syncope, dyspnea, diaphoresis, orthopnea, PND, claudication, edema Respiratory: denies cough, dyspnea, DOE, pleurisy, hoarseness, laryngitis, wheezing.  Gastrointestinal: Denies dysphagia, heartburn, reflux, water brash, pain, cramps, nausea, vomiting, bloating, diarrhea, constipation, hematemesis, melena, hematochezia, jaundice, hemorrhoids Genitourinary: Denies dysuria, frequency, urgency, nocturia, hesitancy, discharge, hematuria, flank pain Breast: Breast lumps, nipple discharge, bleeding.  Musculoskeletal: Denies  arthralgia, myalgia, stiffness, Jt. Swelling, pain, limp, and strain/sprain. Denies falls. Skin: Denies puritis, rash, hives, warts, acne, eczema, changing in skin lesion Neuro: No weakness, tremor, incoordination, spasms, paresthesia, pain Psychiatric: Denies confusion, memory loss, sensory loss. Denies Depression. Endocrine: Denies change in weight, skin, hair change, nocturia, and paresthesia, diabetic polys, visual blurring, hyper / hypo glycemic episodes.  Heme/Lymph: No excessive bleeding, bruising, enlarged lymph nodes.  Physical Exam  BP 142/86   Pulse 80  Temp 97.9 F   Resp 16  Ht 5\' 4"    Wt 152 lb 9.6 oz     BMI 26.18   General Appearance: Well nourished and in no apparent distress. Eyes: PERRLA, EOMs, conjunctiva no swelling or erythema, normal fundi and vessels. Sinuses: No frontal/maxillary tenderness ENT/Mouth: EACs patent / TMs  nl. Nares clear without erythema, swelling, mucoid exudates. Oral hygiene is good. No erythema, swelling, or exudate. Tongue normal, non-obstructing. Tonsils not swollen or erythematous. Hearing normal.  Neck: Supple, thyroid normal. No bruits, nodes or JVD. Respiratory: Respiratory effort normal.  BS equal and clear bilateral without rales, rhonci, wheezing or stridor. Cardio: Heart sounds are normal  with regular rate and rhythm and no murmurs, rubs or gallops. Peripheral pulses are normal and equal bilaterally without edema. No aortic or femoral bruits. Chest: symmetric with normal excursions and percussion. Breasts: Symmetric, without lumps, nipple discharge, retractions, or fibrocystic changes.  Abdomen: Flat, soft, with bowel sounds. Nontender, no guarding, rebound, hernias, masses, or organomegaly.  Lymphatics: Non tender without lymphadenopathy.  Musculoskeletal: Full ROM all peripheral extremities, joint stability, 5/5 strength, and normal gait. Skin: Warm and dry without rashes, lesions, cyanosis, clubbing or  ecchymosis.  Neuro: Cranial  nerves intact, reflexes equal bilaterally. Normal muscle tone, no cerebellar symptoms. Sensation intact.  Pysch: Awake and oriented X 3, normal affect, Insight and Judgment appropriate.   Assessment and Plan  1. Essential hypertension  - Microalbumin / creatinine urine ratio - [place], RETROPERITNL ABD,  LTD - TSH  2. Hyperlipidemia  - Lipid panel  3. Prediabetes  - Hemoglobin A1c - Insulin, random  4. Vitamin D deficiency  - Vit D  25 hydroxy   5. Chronic atrial fibrillation / SSS   6. Gastroesophageal reflux disease, hx/o  - controlled with diet   7. Screening for rectal cancer  - POC Hemoccult Bld/Stl  8. Depression screen   9. Depression, controlled   10. Medication management  - Urine Microscopic - CBC with Differential/Platelet - Hepatic function panel - Magnesium  11. BMI 25.0-25.9,adult   12. At low risk for fall   Continue prudent diet as discussed, weight control, BP monitoring, regular exercise, and medications. Discussed med's effects and SE's. Screening labs and tests as requested with regular follow-up as recommended.  Over 40 minutes of exam, counseling, chart review was performed.

## 2014-12-02 ENCOUNTER — Encounter: Payer: Self-pay | Admitting: Internal Medicine

## 2014-12-02 ENCOUNTER — Ambulatory Visit (INDEPENDENT_AMBULATORY_CARE_PROVIDER_SITE_OTHER): Payer: Medicare Other | Admitting: Internal Medicine

## 2014-12-02 VITALS — BP 142/86 | HR 80 | Temp 97.9°F | Resp 16 | Ht 64.0 in | Wt 152.6 lb

## 2014-12-02 DIAGNOSIS — F32A Depression, unspecified: Secondary | ICD-10-CM

## 2014-12-02 DIAGNOSIS — Z9181 History of falling: Secondary | ICD-10-CM

## 2014-12-02 DIAGNOSIS — F329 Major depressive disorder, single episode, unspecified: Secondary | ICD-10-CM

## 2014-12-02 DIAGNOSIS — I10 Essential (primary) hypertension: Secondary | ICD-10-CM

## 2014-12-02 DIAGNOSIS — Z1212 Encounter for screening for malignant neoplasm of rectum: Secondary | ICD-10-CM

## 2014-12-02 DIAGNOSIS — K219 Gastro-esophageal reflux disease without esophagitis: Secondary | ICD-10-CM

## 2014-12-02 DIAGNOSIS — R7303 Prediabetes: Secondary | ICD-10-CM

## 2014-12-02 DIAGNOSIS — Z6825 Body mass index (BMI) 25.0-25.9, adult: Secondary | ICD-10-CM

## 2014-12-02 DIAGNOSIS — Z0000 Encounter for general adult medical examination without abnormal findings: Secondary | ICD-10-CM | POA: Diagnosis not present

## 2014-12-02 DIAGNOSIS — I482 Chronic atrial fibrillation, unspecified: Secondary | ICD-10-CM

## 2014-12-02 DIAGNOSIS — Z79899 Other long term (current) drug therapy: Secondary | ICD-10-CM

## 2014-12-02 DIAGNOSIS — Z1331 Encounter for screening for depression: Secondary | ICD-10-CM

## 2014-12-02 DIAGNOSIS — E559 Vitamin D deficiency, unspecified: Secondary | ICD-10-CM

## 2014-12-02 DIAGNOSIS — E782 Mixed hyperlipidemia: Secondary | ICD-10-CM

## 2014-12-02 LAB — CBC WITH DIFFERENTIAL/PLATELET
Basophils Absolute: 0.1 10*3/uL (ref 0.0–0.1)
Basophils Relative: 1 % (ref 0–1)
EOS PCT: 2 % (ref 0–5)
Eosinophils Absolute: 0.1 10*3/uL (ref 0.0–0.7)
HEMATOCRIT: 41.4 % (ref 36.0–46.0)
HEMOGLOBIN: 13.6 g/dL (ref 12.0–15.0)
Lymphocytes Relative: 34 % (ref 12–46)
Lymphs Abs: 2.5 10*3/uL (ref 0.7–4.0)
MCH: 29.8 pg (ref 26.0–34.0)
MCHC: 32.9 g/dL (ref 30.0–36.0)
MCV: 90.8 fL (ref 78.0–100.0)
MONO ABS: 0.8 10*3/uL (ref 0.1–1.0)
MPV: 10.7 fL (ref 8.6–12.4)
Monocytes Relative: 11 % (ref 3–12)
NEUTROS ABS: 3.8 10*3/uL (ref 1.7–7.7)
NEUTROS PCT: 52 % (ref 43–77)
PLATELETS: 239 10*3/uL (ref 150–400)
RBC: 4.56 MIL/uL (ref 3.87–5.11)
RDW: 13.7 % (ref 11.5–15.5)
WBC: 7.3 10*3/uL (ref 4.0–10.5)

## 2014-12-02 LAB — LIPID PANEL
CHOLESTEROL: 150 mg/dL (ref 125–200)
HDL: 36 mg/dL — AB (ref >=46)
LDL CALC: 88 mg/dL (ref <130)
TRIGLYCERIDES: 130 mg/dL (ref <150)
Total CHOL/HDL Ratio: 4.2 Ratio (ref <=5.0)
VLDL: 26 mg/dL (ref <30)

## 2014-12-02 LAB — HEMOGLOBIN A1C
Hgb A1c MFr Bld: 5.9 % — ABNORMAL HIGH (ref <5.7)
Mean Plasma Glucose: 123 mg/dL — ABNORMAL HIGH (ref <117)

## 2014-12-02 LAB — HEPATIC FUNCTION PANEL
ALT: 12 U/L (ref 6–29)
AST: 15 U/L (ref 10–35)
Albumin: 4 g/dL (ref 3.6–5.1)
Alkaline Phosphatase: 122 U/L (ref 33–130)
Bilirubin, Direct: 0.1 mg/dL
Indirect Bilirubin: 0.4 mg/dL (ref 0.2–1.2)
Total Bilirubin: 0.5 mg/dL (ref 0.2–1.2)
Total Protein: 6.6 g/dL (ref 6.1–8.1)

## 2014-12-02 LAB — TSH: TSH: 1.383 u[IU]/mL (ref 0.350–4.500)

## 2014-12-02 LAB — MAGNESIUM: Magnesium: 2 mg/dL (ref 1.5–2.5)

## 2014-12-03 LAB — MICROALBUMIN / CREATININE URINE RATIO
Creatinine, Urine: 84.9 mg/dL
MICROALB/CREAT RATIO: 23.6 mg/g (ref 0.0–30.0)
Microalb, Ur: 2 mg/dL (ref <2.0)

## 2014-12-03 LAB — INSULIN, RANDOM: Insulin: 9.3 u[IU]/mL (ref 2.0–19.6)

## 2014-12-03 LAB — URINALYSIS, MICROSCOPIC ONLY
CASTS: NONE SEEN [LPF]
Yeast: NONE SEEN [HPF]

## 2014-12-03 LAB — VITAMIN D 25 HYDROXY (VIT D DEFICIENCY, FRACTURES): VIT D 25 HYDROXY: 36 ng/mL (ref 30–100)

## 2014-12-04 ENCOUNTER — Other Ambulatory Visit: Payer: Self-pay | Admitting: Internal Medicine

## 2014-12-04 MED ORDER — NITROFURANTOIN MONOHYD MACRO 100 MG PO CAPS: ORAL_CAPSULE | ORAL | Status: AC

## 2014-12-09 ENCOUNTER — Ambulatory Visit (INDEPENDENT_AMBULATORY_CARE_PROVIDER_SITE_OTHER): Payer: Medicare Other | Admitting: Pharmacist Clinician (PhC)/ Clinical Pharmacy Specialist

## 2014-12-09 DIAGNOSIS — Z7901 Long term (current) use of anticoagulants: Secondary | ICD-10-CM

## 2014-12-09 DIAGNOSIS — I482 Chronic atrial fibrillation, unspecified: Secondary | ICD-10-CM

## 2014-12-09 DIAGNOSIS — I4891 Unspecified atrial fibrillation: Secondary | ICD-10-CM | POA: Diagnosis not present

## 2014-12-09 LAB — POCT INR: INR: 3.1

## 2014-12-14 NOTE — Addendum Note (Signed)
Addended by: Koah Chisenhall A on: 12/14/2014 11:08 AM   Modules accepted: Orders

## 2014-12-24 ENCOUNTER — Other Ambulatory Visit: Payer: Self-pay | Admitting: *Deleted

## 2014-12-24 DIAGNOSIS — Z1212 Encounter for screening for malignant neoplasm of rectum: Secondary | ICD-10-CM

## 2014-12-24 LAB — POC HEMOCCULT BLD/STL (HOME/3-CARD/SCREEN)
FECAL OCCULT BLD: NEGATIVE
FECAL OCCULT BLD: NEGATIVE
FECAL OCCULT BLD: NEGATIVE

## 2014-12-30 ENCOUNTER — Ambulatory Visit (INDEPENDENT_AMBULATORY_CARE_PROVIDER_SITE_OTHER): Payer: Medicare Other | Admitting: Pharmacist Clinician (PhC)/ Clinical Pharmacy Specialist

## 2014-12-30 DIAGNOSIS — I4891 Unspecified atrial fibrillation: Secondary | ICD-10-CM | POA: Diagnosis not present

## 2014-12-30 DIAGNOSIS — Z7901 Long term (current) use of anticoagulants: Secondary | ICD-10-CM

## 2014-12-30 LAB — POCT INR: INR: 2.8

## 2015-01-06 ENCOUNTER — Other Ambulatory Visit: Payer: Self-pay | Admitting: Internal Medicine

## 2015-01-06 DIAGNOSIS — F411 Generalized anxiety disorder: Secondary | ICD-10-CM

## 2015-01-13 NOTE — Progress Notes (Signed)
Patient ID: KINZLEIGH Blackwell, female   DOB: February 08, 1928, 81 y.o.   MRN: [id number]     Cardiology Office Note   Date:  01/14/2015   [name], DOB 31-Jan-1928, MRN [id number]  PCP:  Alesia Richards, MD  Cardiologist:   Sanda Klein, MD   Chief Complaint  Patient presents with  . Follow-up    pt denied chest pain  . Shortness of Breath    pt c/o SOB at times       History of Present Illness: Misty Blackwell is a 81 y.o. female who presents for permanent atrial fibrillation with slow ventricular response and pacemaker, remote TIA, HTN, minor CAD, asymptomatic pacemaker-detected NSVT, history of diastolic HF.  She is clearly adjusting better after the demise of her husband. Her house is no longer on the market. She has started painting again and engages in all types of social activities. She still becomes teary-eyed and distressed when talking about her husband of 67 years.  She has permanent atrial fibrillation and interrogation of her Medtronic Adapta pacemaker implanted in 2011 shows roughly 89.6% ventricular pacing due to atrial fibrillation with slow ventricular response. Estimated generator longevity 6.5 years. It shows only one very brief episode of high ventricular rate.   Past Medical History  Diagnosis Date  . Coronary artery disease 03/19/2002    R/P Cardiolite - EF 76%; nromal static and dynamic myocardial perfusion images; normal wall motion and endocardial thickening in all vascular territories  . Hypertension   . Atrial fib/flutter, transient   . Skin cancer     s/p surgical removal.  . Pacemaker   . TIA (transient ischemic attack)   . CHF (congestive heart failure) 10/29/2009    Echo - EF >55%; normal LV size and systolic function; unable to assess diastolic fcn due to E/A fusion, pulmonary vein flow pattern suggests elevated filling pressure; marked biatrail dilation, mild/mod tricuspid regurgitation; mod pulmonary htn; mild/mod mitral regurgitation;  although echocardiographic features are incomplete findings suggest possible infiltrative cardiomyopathy (maybe amyloidosi  . Facial numbness 12/26/2008    carotid doppler - R and L ICAs 0-49% diameter reduction (velocities suggest low end of scale)  . Atrial fibrillation, chronic   . Peripheral neuropathy     Past Surgical History  Procedure Laterality Date  . Cardioversion  11/19/2009    successful DCCV from AF to sinus type rhythm  . Cardiac catheterization  08/06/2005    minimal coronary disease predominant RCA; no significant atherosclerosis; new onset sick sinus syndrome and atrial flutter w/ ventricular response, controlled on med therapy; systemic HTN, normal renal arteries  . Cholecystectomy    . Appendectomy    . Abdominal hysterectomy    . Skin cancer resection       Current Outpatient Prescriptions  Medication Sig Dispense Refill  . ALPRAZolam (XANAX) 1 MG tablet Take 1/2 to 1 tablet 3 x daily if needed for Anxiety 90 tablet 5  . aspirin EC 81 MG tablet Take 81 mg by mouth daily.    . cholecalciferol (VITAMIN D) 1000 UNITS tablet Take 1,000 Units by mouth 3 (three) times daily.     Marland Kitchen diltiazem (TIAZAC) 240 MG 24 hr capsule Take 1 capsule (240 mg total) by mouth daily. 30 capsule 11  . furosemide (LASIX) 40 MG tablet Take 20 mg by mouth daily.    Marland Kitchen gabapentin (NEURONTIN) 300 MG capsule take 1 capsule by mouth twice a day 60 capsule 5  . potassium chloride SA (K-DUR,KLOR-CON) 20  MEQ tablet take 1 tablet by mouth once daily 30 tablet 4  . VESICARE 5 MG tablet Take 5 mg by mouth daily.    Marland Kitchen warfarin (COUMADIN) 3 MG tablet Take 3-4.5 mg by mouth See admin instructions. Takes 3mg  everyday except Monday and Friday's takes 4.5mg      No current facility-administered medications for this visit.    Allergies:   Ace inhibitors; Acrylic polymer; Augmentin; Chocolate; Ciprofloxacin; Gabapentin; Levaquin; and Zocor    Social History:  The patient  reports that she has never smoked. She  has never used smokeless tobacco. She reports that she does not drink alcohol or use illicit drugs.   Family History:  The patient's family history includes Cirrhosis in her brother; Diabetes in her mother; Heart attack in her father; Heart disease in her father and mother.    ROS:  Please see the history of present illness.    Otherwise, review of systems positive for none.   All other systems are reviewed and negative.    PHYSICAL EXAM: VS:  BP 127/72 mmHg  Pulse 75  Resp 16  Ht 5' 4.5" (1.638 m)  Wt 153 lb 6.4 oz (69.582 kg)  BMI 25.93 kg/m2 , BMI Body mass index is 25.93 kg/(m^2).  General: Alert, oriented x3, no distress Head: no evidence of trauma, PERRL, EOMI, no exophtalmos or lid lag, no myxedema, no xanthelasma; normal ears, nose and oropharynx Neck: normal jugular venous pulsations and no hepatojugular reflux; brisk carotid pulses without delay and no carotid bruits Chest: clear to auscultation, no signs of consolidation by percussion or palpation, normal fremitus, symmetrical and full respiratory excursions, healthy left subclavian pacemaker site Cardiovascular: normal position and quality of the apical impulse, regular rhythm, normal first and second heart sounds, no murmurs, rubs or gallops Abdomen: no tenderness or distention, no masses by palpation, no abnormal pulsatility or arterial bruits, normal bowel sounds, no hepatosplenomegaly Extremities: no clubbing, cyanosis or edema; 2+ radial, ulnar and brachial pulses bilaterally; 2+ right femoral, posterior tibial and dorsalis pedis pulses; 2+ left femoral, posterior tibial and dorsalis pedis pulses; no subclavian or femoral bruits Neurological: grossly nonfocal Psych: euthymic mood, full affect   EKG:  EKG is ordered today. The ekg ordered today demonstrates atrial fibrillation, ventricular pacing  Recent Labs: 07/17/2014: BUN 12; Creat 0.76; Potassium 4.0; Sodium 142 12/02/2014: ALT 12; Hemoglobin 13.6; Magnesium 2.0;  Platelets 239; TSH 1.383    Lipid Panel    Component Value Date/Time   CHOL 150 12/02/2014 1211   TRIG 130 12/02/2014 1211   HDL 36* 12/02/2014 1211   CHOLHDL 4.2 12/02/2014 1211   VLDL 26 12/02/2014 1211   LDLCALC 88 12/02/2014 1211      Wt Readings from Last 3 Encounters:  01/14/15 153 lb 6.4 oz (69.582 kg)  12/02/14 152 lb 9.6 oz (69.219 kg)  10/08/14 152 lb (68.947 kg)    .   ASSESSMENT AND PLAN:  1. Coronary atherosclerosis - normal Lexiscan Myoview.   2. Permanent atrial fibrillation with slow ventricular response s/p pacemaker - We cut back on the diltiazem dose to reduce the need for ventricular pacing at her June appointment. There still 90% ventricular pacing. We'll discontinue carvedilol. History of TIA  (CHADSVasc 6) on anticoagulants.  3. Nonsustained ventricular tachycardia. Very low burden. We'll discontinue her beta blocker to allow more native AV conduction  4. Systemic hypertension - very well controlled  5. Depression -symptoms appear improved   Current medicines are reviewed at length with the patient  today.  The patient does not have concerns regarding medicines.  The following changes have been made:  no change  Labs/ tests ordered today include:  No orders of the defined types were placed in this encounter.     Patient Instructions  Medication Instructions:   STOP THE CARVEDILOL  Follow-Up:  Dr. Sallyanne Kuster recommends that you schedule a follow-up appointment in: [place] (MDT-GRAY).    Any Other Special Instructions Will Be Listed Below (If Applicable).  Remote monitoring is used to monitor your Pacemaker or ICD from home. This monitoring reduces the number of office visits required to check your device to one time per year. It allows [place] to monitor the functioning of your device to ensure it is working properly. You are scheduled for a device check from home on April 16, 2015. You may send your transmission at any  time that day. If you have a wireless device, the transmission will be sent automatically. After your physician reviews your transmission, you will receive a postcard with your next transmission date.       Mikael Spray, MD  01/14/2015 3:06 PM    Sanda Klein, MD, [facility] [phone] office [phone] pager

## 2015-01-14 ENCOUNTER — Ambulatory Visit (INDEPENDENT_AMBULATORY_CARE_PROVIDER_SITE_OTHER): Payer: Medicare Other | Admitting: Cardiovascular Disease

## 2015-01-14 ENCOUNTER — Ambulatory Visit (INDEPENDENT_AMBULATORY_CARE_PROVIDER_SITE_OTHER): Payer: Medicare Other | Admitting: *Deleted

## 2015-01-14 ENCOUNTER — Encounter: Payer: Self-pay | Admitting: Cardiovascular Disease

## 2015-01-14 VITALS — BP 127/72 | HR 75 | Resp 16 | Ht 64.5 in | Wt 153.4 lb

## 2015-01-14 DIAGNOSIS — I10 Essential (primary) hypertension: Secondary | ICD-10-CM | POA: Diagnosis not present

## 2015-01-14 DIAGNOSIS — Z7901 Long term (current) use of anticoagulants: Secondary | ICD-10-CM

## 2015-01-14 DIAGNOSIS — I2510 Atherosclerotic heart disease of native coronary artery without angina pectoris: Secondary | ICD-10-CM | POA: Diagnosis not present

## 2015-01-14 DIAGNOSIS — I482 Chronic atrial fibrillation, unspecified: Secondary | ICD-10-CM

## 2015-01-14 DIAGNOSIS — Z950 Presence of cardiac pacemaker: Secondary | ICD-10-CM | POA: Diagnosis not present

## 2015-01-14 DIAGNOSIS — I4891 Unspecified atrial fibrillation: Secondary | ICD-10-CM | POA: Diagnosis not present

## 2015-01-14 LAB — POCT INR: INR: 3

## 2015-01-14 NOTE — Patient Instructions (Signed)
Medication Instructions:   STOP THE CARVEDILOL  Follow-Up:  Dr. Sallyanne Kuster recommends that you schedule a follow-up appointment in: [place] (MDT-GRAY).    Any Other Special Instructions Will Be Listed Below (If Applicable).  Remote monitoring is used to monitor your Pacemaker or ICD from home. This monitoring reduces the number of office visits required to check your device to one time per year. It allows [place] to monitor the functioning of your device to ensure it is working properly. You are scheduled for a device check from home on April 16, 2015. You may send your transmission at any time that day. If you have a wireless device, the transmission will be sent automatically. After your physician reviews your transmission, you will receive a postcard with your next transmission date.

## 2015-01-27 ENCOUNTER — Other Ambulatory Visit: Payer: Self-pay | Admitting: Internal Medicine

## 2015-01-27 ENCOUNTER — Other Ambulatory Visit: Payer: Self-pay | Admitting: Cardiovascular Disease

## 2015-01-27 MED ORDER — POTASSIUM CHLORIDE CRYS ER 20 MEQ PO TBCR: 20.0000 meq | EXTENDED_RELEASE_TABLET | Freq: Every day | ORAL | Status: DC

## 2015-02-02 LAB — CUP PACEART INCLINIC DEVICE CHECK
Battery Impedance: 612 Ohm
Battery Remaining Longevity: 77 mo
Battery Voltage: 2.79 V
Brady Statistic RV Percent Paced: 90 %
Lead Channel Setting Pacing Pulse Width: 0.4 ms
Lead Channel Setting Sensing Sensitivity: 5.6 mV
MDC IDC MSMT LEADCHNL RA IMPEDANCE VALUE: 67 Ohm
MDC IDC MSMT LEADCHNL RV IMPEDANCE VALUE: 708 Ohm
MDC IDC MSMT LEADCHNL RV PACING THRESHOLD AMPLITUDE: 0.5 V
MDC IDC MSMT LEADCHNL RV PACING THRESHOLD PULSEWIDTH: 0.4 ms
MDC IDC SESS DTM: 20160913110539
MDC IDC SET LEADCHNL RV PACING AMPLITUDE: 2.5 V

## 2015-02-10 ENCOUNTER — Ambulatory Visit (INDEPENDENT_AMBULATORY_CARE_PROVIDER_SITE_OTHER): Payer: Medicare Other | Admitting: Pharmacist Clinician (PhC)/ Clinical Pharmacy Specialist

## 2015-02-10 DIAGNOSIS — I4891 Unspecified atrial fibrillation: Secondary | ICD-10-CM

## 2015-02-10 DIAGNOSIS — I482 Chronic atrial fibrillation, unspecified: Secondary | ICD-10-CM

## 2015-02-10 DIAGNOSIS — Z7901 Long term (current) use of anticoagulants: Secondary | ICD-10-CM

## 2015-02-10 LAB — POCT INR: INR: 3.1

## 2015-02-12 ENCOUNTER — Encounter: Payer: Self-pay | Admitting: Cardiovascular Disease

## 2015-03-10 ENCOUNTER — Ambulatory Visit (INDEPENDENT_AMBULATORY_CARE_PROVIDER_SITE_OTHER): Payer: Medicare Other | Admitting: Pharmacist Clinician (PhC)/ Clinical Pharmacy Specialist

## 2015-03-10 DIAGNOSIS — I4891 Unspecified atrial fibrillation: Secondary | ICD-10-CM

## 2015-03-10 DIAGNOSIS — Z7901 Long term (current) use of anticoagulants: Secondary | ICD-10-CM | POA: Diagnosis not present

## 2015-03-10 LAB — POCT INR: INR: 2.4

## 2015-03-19 ENCOUNTER — Ambulatory Visit (INDEPENDENT_AMBULATORY_CARE_PROVIDER_SITE_OTHER): Payer: Medicare Other | Admitting: Internal Medicine

## 2015-03-19 ENCOUNTER — Encounter: Payer: Self-pay | Admitting: Internal Medicine

## 2015-03-19 VITALS — BP 142/70 | HR 82 | Temp 98.0°F | Resp 16 | Ht 64.0 in | Wt 156.0 lb

## 2015-03-19 DIAGNOSIS — E559 Vitamin D deficiency, unspecified: Secondary | ICD-10-CM

## 2015-03-19 DIAGNOSIS — I482 Chronic atrial fibrillation, unspecified: Secondary | ICD-10-CM

## 2015-03-19 DIAGNOSIS — Z23 Encounter for immunization: Secondary | ICD-10-CM

## 2015-03-19 DIAGNOSIS — R7303 Prediabetes: Secondary | ICD-10-CM | POA: Diagnosis not present

## 2015-03-19 DIAGNOSIS — E782 Mixed hyperlipidemia: Secondary | ICD-10-CM | POA: Diagnosis not present

## 2015-03-19 DIAGNOSIS — I10 Essential (primary) hypertension: Secondary | ICD-10-CM

## 2015-03-19 DIAGNOSIS — Z79899 Other long term (current) drug therapy: Secondary | ICD-10-CM | POA: Diagnosis not present

## 2015-03-19 LAB — HEPATIC FUNCTION PANEL
ALT: 21 U/L (ref 6–29)
AST: 21 U/L (ref 10–35)
Albumin: 4 g/dL (ref 3.6–5.1)
Alkaline Phosphatase: 121 U/L (ref 33–130)
BILIRUBIN DIRECT: 0.2 mg/dL (ref <=0.2)
BILIRUBIN TOTAL: 0.6 mg/dL (ref 0.2–1.2)
Indirect Bilirubin: 0.4 mg/dL (ref 0.2–1.2)
Total Protein: 6.8 g/dL (ref 6.1–8.1)

## 2015-03-19 LAB — CBC WITH DIFFERENTIAL/PLATELET
Basophils Absolute: 0.1 10*3/uL (ref 0.0–0.1)
Basophils Relative: 1 % (ref 0–1)
Eosinophils Absolute: 0.1 10*3/uL (ref 0.0–0.7)
Eosinophils Relative: 1 % (ref 0–5)
HEMATOCRIT: 42.8 % (ref 36.0–46.0)
HEMOGLOBIN: 14.1 g/dL (ref 12.0–15.0)
LYMPHS ABS: 2.2 10*3/uL (ref 0.7–4.0)
LYMPHS PCT: 29 % (ref 12–46)
MCH: 30.3 pg (ref 26.0–34.0)
MCHC: 32.9 g/dL (ref 30.0–36.0)
MCV: 92 fL (ref 78.0–100.0)
MONOS PCT: 11 % (ref 3–12)
MPV: 10.6 fL (ref 8.6–12.4)
Monocytes Absolute: 0.8 10*3/uL (ref 0.1–1.0)
NEUTROS ABS: 4.5 10*3/uL (ref 1.7–7.7)
NEUTROS PCT: 58 % (ref 43–77)
Platelets: 255 10*3/uL (ref 150–400)
RBC: 4.65 MIL/uL (ref 3.87–5.11)
RDW: 13.6 % (ref 11.5–15.5)
WBC: 7.7 10*3/uL (ref 4.0–10.5)

## 2015-03-19 LAB — BASIC METABOLIC PANEL WITH GFR
BUN: 15 mg/dL (ref 7–25)
CHLORIDE: 104 mmol/L (ref 98–110)
CO2: 30 mmol/L (ref 20–31)
Calcium: 9 mg/dL (ref 8.6–10.4)
Creat: 0.69 mg/dL (ref 0.60–0.88)
GFR, EST NON AFRICAN AMERICAN: 80 mL/min (ref >=60)
GFR, Est African American: >89 mL/min (ref >=60)
Glucose, Bld: 76 mg/dL (ref 65–99)
POTASSIUM: 3.9 mmol/L (ref 3.5–5.3)
Sodium: 142 mmol/L (ref 135–146)

## 2015-03-19 LAB — LIPID PANEL
CHOLESTEROL: 153 mg/dL (ref 125–200)
HDL: 40 mg/dL — ABNORMAL LOW (ref >=46)
LDL Cholesterol: 87 mg/dL (ref <130)
TRIGLYCERIDES: 129 mg/dL (ref <150)
Total CHOL/HDL Ratio: 3.8 Ratio (ref <=5.0)
VLDL: 26 mg/dL (ref <30)

## 2015-03-19 LAB — HEMOGLOBIN A1C
Hgb A1c MFr Bld: 5.8 % — ABNORMAL HIGH (ref <5.7)
Mean Plasma Glucose: 120 mg/dL — ABNORMAL HIGH (ref <117)

## 2015-03-19 NOTE — Patient Instructions (Signed)
Sertraline tablets What is this medicine? SERTRALINE (SER tra leen) is used to treat depression. It may also be used to treat obsessive compulsive disorder, panic disorder, post-trauma stress, premenstrual dysphoric disorder (PMDD) or social anxiety. This medicine may be used for other purposes; ask your health care provider or pharmacist if you have questions. What should I tell my health care provider before I take this medicine? They need to know if you have any of these conditions: -bipolar disorder or a family history of bipolar disorder -diabetes -glaucoma -heart disease -high blood pressure -history of irregular heartbeat -history of low levels of calcium, magnesium, or potassium in the blood -if you often drink alcohol -liver disease -receiving electroconvulsive therapy -seizures -suicidal thoughts, plans, or attempt; a previous suicide attempt by you or a family member -thyroid disease -an unusual or allergic reaction to sertraline, other medicines, foods, dyes, or preservatives -pregnant or trying to get pregnant -breast-feeding How should I use this medicine? Take this medicine by mouth with a glass of water. Follow the directions on the prescription label. You can take it with or without food. Take your medicine at regular intervals. Do not take your medicine more often than directed. Do not stop taking this medicine suddenly except upon the advice of your doctor. Stopping this medicine too quickly may cause serious side effects or your condition may worsen. A special MedGuide will be given to you by the pharmacist with each prescription and refill. Be sure to read this information carefully each time. Talk to your pediatrician regarding the use of this medicine in children. While this drug may be prescribed for children as young as 7 years for selected conditions, precautions do apply. Overdosage: If you think you have taken too much of this medicine contact a poison control  center or emergency room at once. NOTE: This medicine is only for you. Do not share this medicine with others. What if I miss a dose? If you miss a dose, take it as soon as you can. If it is almost time for your next dose, take only that dose. Do not take double or extra doses. What may interact with this medicine? Do not take this medicine with any of the following medications: -certain medicines for fungal infections like fluconazole, itraconazole, ketoconazole, posaconazole, voriconazole -cisapride -disulfiram -dofetilide -linezolid -MAOIs like Carbex, Eldepryl, Marplan, Nardil, and Parnate -metronidazole -methylene blue (injected into a vein) -pimozide -thioridazine -ziprasidone This medicine may also interact with the following medications: -alcohol -aspirin and aspirin-like medicines -certain medicines for depression, anxiety, or psychotic disturbances -certain medicines for irregular heart beat like flecainide, propafenone -certain medicines for migraine headaches like almotriptan, eletriptan, frovatriptan, naratriptan, rizatriptan, sumatriptan, zolmitriptan -certain medicines for sleep -certain medicines for seizures like carbamazepine, valproic acid, phenytoin -certain medicines that treat or prevent blood clots like warfarin, enoxaparin, dalteparin -cimetidine -digoxin -diuretics -fentanyl -furazolidone -isoniazid -lithium -NSAIDs, medicines for pain and inflammation, like ibuprofen or naproxen -other medicines that prolong the QT interval (cause an abnormal heart rhythm) -procarbazine -rasagiline -supplements like St. John's wort, kava kava, valerian -tolbutamide -tramadol -tryptophan This list may not describe all possible interactions. Give your health care provider a list of all the medicines, herbs, non-prescription drugs, or dietary supplements you use. Also tell them if you smoke, drink alcohol, or use illegal drugs. Some items may interact with your  medicine. What should I watch for while using this medicine? Tell your doctor if your symptoms do not get better or if they get worse. Visit your doctor   or health care professional for regular checks on your progress. Because it may take several weeks to see the full effects of this medicine, it is important to continue your treatment as prescribed by your doctor. Patients and their families should watch out for new or worsening thoughts of suicide or depression. Also watch out for sudden changes in feelings such as feeling anxious, agitated, panicky, irritable, hostile, aggressive, impulsive, severely restless, overly excited and hyperactive, or not being able to sleep. If this happens, especially at the beginning of treatment or after a change in dose, call your health care professional. You may get drowsy or dizzy. Do not drive, use machinery, or do anything that needs mental alertness until you know how this medicine affects you. Do not stand or sit up quickly, especially if you are an older patient. This reduces the risk of dizzy or fainting spells. Alcohol may interfere with the effect of this medicine. Avoid alcoholic drinks. Your mouth may get dry. Chewing sugarless gum or sucking hard candy, and drinking plenty of water may help. Contact your doctor if the problem does not go away or is severe. What side effects may I notice from receiving this medicine? Side effects that you should report to your doctor or health care professional as soon as possible: -allergic reactions like skin rash, itching or hives, swelling of the face, lips, or tongue -black or bloody stools, blood in the urine or vomit -fast, irregular heartbeat -feeling faint or lightheaded, falls -hallucination, loss of contact with reality -seizures -suicidal thoughts or other mood changes -unusual bleeding or bruising -unusually weak or tired -vomiting Side effects that usually do not require medical attention (report to your  doctor or health care professional if they continue or are bothersome): -change in appetite -change in sex drive or performance -diarrhea -increased sweating -indigestion, nausea -tremors This list may not describe all possible side effects. Call your doctor for medical advice about side effects. You may report side effects to FDA at 1-800-FDA-1088. Where should I keep my medicine? Keep out of the reach of children. Store at room temperature between 15 and 30 degrees C (59 and 86 degrees F). Throw away any unused medicine after the expiration date. NOTE: This sheet is a summary. It may not cover all possible information. If you have questions about this medicine, talk to your doctor, pharmacist, or health care provider.    2016, Elsevier/Gold Standard. (2012-11-14 12:57:35)  

## 2015-03-19 NOTE — Progress Notes (Signed)
Patient ID: CABELA GUMMER, female   DOB: 1927/10/07, 81 y.o.   MRN: [id number]  Assessment and Plan:  Hypertension:  -Continue medication,  -monitor blood pressure at home.  -Continue DASH diet.   -Reminder to go to the ER if any CP, SOB, nausea, dizziness, severe HA, changes vision/speech, left arm numbness and tingling, and jaw pain.  Cholesterol: -Continue diet and exercise.  -Check cholesterol.   Pre-diabetes: -Continue diet and exercise.  -Check A1C  Vitamin D Def: -check level -continue medications.   Depression -cont xanax prn -consider adding in zoloft -reading information given  Continue diet and meds as discussed. Further disposition pending results of labs.  HPI 81 y.o. female  presents for 3 month follow up with hypertension, hyperlipidemia, prediabetes and vitamin D.   Her blood pressure has been controlled at home, today their BP is BP: (!) 142/70 mmHg.   She does not workout. She denies chest pain, shortness of breath, dizziness.  She reports that she has been walking a lot.     She is not on cholesterol medication and denies myalgias. Her cholesterol is at goal. The cholesterol last visit was:   Lab Results  Component Value Date   CHOL 150 12/02/2014   HDL 36* 12/02/2014   LDLCALC 88 12/02/2014   TRIG 130 12/02/2014   CHOLHDL 4.2 12/02/2014     She has been working on diet and exercise for prediabetes, and denies foot ulcerations, hyperglycemia, hypoglycemia , increased appetite, nausea, paresthesia of the feet, polydipsia, polyuria, visual disturbances, vomiting and weight loss. Last A1C in the office was:  Lab Results  Component Value Date   HGBA1C 5.9* 12/02/2014    Patient is on Vitamin D supplement.  Lab Results  Component Value Date   VD25OH 36 12/02/2014     She reports that she has been taking care of her sister Bertram Millard and that has been very stressful and she is very concerned about her health.  She is also depressed because the holidays has  been reminding her of her late husband.  She reports that she has been crying a lot.   She is also concerned about her balance and feels that the gabapentin is contributing to it. She reports that she is concerned that she may fall.  She does not use any assistive devices. She reports that it gets better during the day.  She reports that she doesn't want to use a walker or cane because she is "too active".    Current Medications:  Current Outpatient Prescriptions on File Prior to Visit  Medication Sig Dispense Refill  . ALPRAZolam (XANAX) 1 MG tablet Take 1/2 to 1 tablet 3 x daily if needed for Anxiety 90 tablet 5  . aspirin EC 81 MG tablet Take 81 mg by mouth daily.    . cholecalciferol (VITAMIN D) 1000 UNITS tablet Take 1,000 Units by mouth 3 (three) times daily.     Marland Kitchen diltiazem (TIAZAC) 240 MG 24 hr capsule Take 1 capsule (240 mg total) by mouth daily. 30 capsule 11  . furosemide (LASIX) 40 MG tablet Take 20 mg by mouth daily.    Marland Kitchen gabapentin (NEURONTIN) 300 MG capsule take 1 capsule by mouth twice a day 60 capsule 5  . potassium chloride SA (K-DUR,KLOR-CON) 20 MEQ tablet Take 1 tablet (20 mEq total) by mouth daily. 30 tablet 4  . VESICARE 5 MG tablet Take 5 mg by mouth daily.    Marland Kitchen warfarin (COUMADIN) 3 MG tablet Take 3-4.5  mg by mouth See admin instructions.      No current facility-administered medications on file prior to visit.    Medical History:  Past Medical History  Diagnosis Date  . Coronary artery disease 03/19/2002    R/P Cardiolite - EF 76%; nromal static and dynamic myocardial perfusion images; normal wall motion and endocardial thickening in all vascular territories  . Hypertension   . Atrial fib/flutter, transient   . Skin cancer     s/p surgical removal.  . Pacemaker   . TIA (transient ischemic attack)   . CHF (congestive heart failure) ([place]) 10/29/2009    Echo - EF >55%; normal LV size and systolic function; unable to assess diastolic fcn due to E/A fusion,  pulmonary vein flow pattern suggests elevated filling pressure; marked biatrail dilation, mild/mod tricuspid regurgitation; mod pulmonary htn; mild/mod mitral regurgitation; although echocardiographic features are incomplete findings suggest possible infiltrative cardiomyopathy (maybe amyloidosi  . Facial numbness 12/26/2008    carotid doppler - R and L ICAs 0-49% diameter reduction (velocities suggest low end of scale)  . Atrial fibrillation, chronic ([place])   . Peripheral neuropathy ([facility])     Allergies:  Allergies  Allergen Reactions  . Ace Inhibitors     Unknown   . Acrylic Polymer [Carbomer]   . Augmentin [Amoxicillin-Pot Clavulanate]   . Chocolate Other (See Comments)    migraine's   . Ciprofloxacin   . Gabapentin Other (See Comments)    Makes her sleepy   . Levaquin [Levofloxacin In D5w]   . Zocor [Simvastatin]      Review of Systems:  Review of Systems  Constitutional: Negative for fever, chills and malaise/fatigue.  HENT: Negative for congestion, ear pain and sore throat.   Eyes: Negative.   Respiratory: Negative for cough, shortness of breath and wheezing.   Cardiovascular: Negative for chest pain, palpitations and leg swelling.  Gastrointestinal: Negative for heartburn, diarrhea, constipation, blood in stool and melena.  Genitourinary: Negative.   Skin: Negative.   Neurological: Negative for dizziness, sensory change, loss of consciousness and headaches.  Psychiatric/Behavioral: Positive for depression. The patient is not nervous/anxious and does not have insomnia.     Family history- Review and unchanged  Social history- Review and unchanged  Physical Exam: BP 142/70 mmHg  Pulse 82  Temp(Src) 98 F (36.7 C) (Temporal)  Resp 16  Ht 5\' 4"  (1.626 m)  Wt 156 lb (70.761 kg)  BMI 26.76 kg/m2 Wt Readings from Last 3 Encounters:  03/19/15 156 lb (70.761 kg)  01/14/15 153 lb 6.4 oz (69.582 kg)  12/02/14 152 lb 9.6 oz (69.219 kg)    General Appearance: Well  nourished well developed, in no apparent distress. Eyes: PERRLA, EOMs, conjunctiva no swelling or erythema ENT/Mouth: Ear canals normal without obstruction, swelling, erythma, discharge.  TMs normal bilaterally.  Oropharynx moist, clear, without exudate, or postoropharyngeal swelling. Neck: Supple, thyroid normal,no cervical adenopathy  Respiratory: Respiratory effort normal, Breath sounds clear A&P without rhonchi, wheeze, or rale.  No retractions, no accessory usage. Cardio: RRR with no RGs 2/6 murmur blowing. Brisk peripheral pulses without edema.  Abdomen: Soft, + BS,  Non tender, no guarding, rebound, hernias, masses. Musculoskeletal: Full ROM, 5/5 strength, Shuffling gait Skin: Warm, dry without rashes, lesions, ecchymosis.  Neuro: Awake and oriented X 3, Cranial nerves intact. Normal muscle tone, no cerebellar symptoms. Psych: Normal affect, Insight and Judgment appropriate.    Starlyn Skeans, PA-C 9:54 AM [facility]

## 2015-03-20 LAB — TSH: TSH: 1.152 u[IU]/mL (ref 0.350–4.500)

## 2015-03-24 ENCOUNTER — Ambulatory Visit: Payer: Medicare Other

## 2015-03-25 ENCOUNTER — Telehealth: Payer: Self-pay | Admitting: Internal Medicine

## 2015-03-25 MED ORDER — SERTRALINE HCL 50 MG PO TABS: ORAL_TABLET | ORAL | Status: DC

## 2015-03-25 NOTE — Telephone Encounter (Signed)
Left message on machine at 1:53pm.

## 2015-03-25 NOTE — Telephone Encounter (Signed)
Patient interested in trying zoloft per conversation with me.  Prescription sent in.  Recheck in 6 weeks.

## 2015-03-31 NOTE — Telephone Encounter (Signed)
Please call patient and schedule 6 week ov with Courtney.

## 2015-04-07 ENCOUNTER — Ambulatory Visit (INDEPENDENT_AMBULATORY_CARE_PROVIDER_SITE_OTHER): Payer: Medicare Other | Admitting: Pharmacist Clinician (PhC)/ Clinical Pharmacy Specialist

## 2015-04-07 DIAGNOSIS — Z7901 Long term (current) use of anticoagulants: Secondary | ICD-10-CM

## 2015-04-07 DIAGNOSIS — I4891 Unspecified atrial fibrillation: Secondary | ICD-10-CM

## 2015-04-07 LAB — POCT INR: INR: 2.3

## 2015-04-16 ENCOUNTER — Ambulatory Visit (INDEPENDENT_AMBULATORY_CARE_PROVIDER_SITE_OTHER): Payer: Medicare Other | Admitting: *Deleted

## 2015-04-16 DIAGNOSIS — I482 Chronic atrial fibrillation, unspecified: Secondary | ICD-10-CM

## 2015-04-16 NOTE — Progress Notes (Signed)
Remote pacemaker transmission.   

## 2015-04-21 ENCOUNTER — Other Ambulatory Visit: Payer: Self-pay | Admitting: Cardiovascular Disease

## 2015-04-21 LAB — CUP PACEART REMOTE DEVICE CHECK
Date Time Interrogation Session: 20161214151531
Implantable Lead Location: 753859
Implantable Lead Location: 753860
Implantable Lead Model: 4592
Lead Channel Impedance Value: 67 Ohm
Lead Channel Impedance Value: 706 Ohm
Lead Channel Pacing Threshold Amplitude: 0.5 V
Lead Channel Pacing Threshold Pulse Width: 0.4 ms
Lead Channel Setting Pacing Amplitude: 2.5 V
MDC IDC LEAD IMPLANT DT: 20110428
MDC IDC LEAD IMPLANT DT: 20110428
MDC IDC MSMT BATTERY IMPEDANCE: 663 Ohm
MDC IDC MSMT BATTERY REMAINING LONGEVITY: 74 mo
MDC IDC MSMT BATTERY VOLTAGE: 2.79 V
MDC IDC SET LEADCHNL RV PACING PULSEWIDTH: 0.4 ms
MDC IDC SET LEADCHNL RV SENSING SENSITIVITY: 5.6 mV
MDC IDC STAT BRADY RV PERCENT PACED: 87 %

## 2015-04-24 ENCOUNTER — Encounter: Payer: Self-pay | Admitting: *Deleted

## 2015-05-04 DIAGNOSIS — J189 Pneumonia, unspecified organism: Secondary | ICD-10-CM

## 2015-05-04 HISTORY — DX: Pneumonia, unspecified organism: J18.9

## 2015-05-06 ENCOUNTER — Ambulatory Visit (INDEPENDENT_AMBULATORY_CARE_PROVIDER_SITE_OTHER): Payer: Medicare Other | Admitting: Internal Medicine

## 2015-05-06 ENCOUNTER — Encounter: Payer: Self-pay | Admitting: Internal Medicine

## 2015-05-06 VITALS — BP 138/66 | HR 82 | Temp 98.0°F | Resp 16 | Ht 64.0 in | Wt 158.0 lb

## 2015-05-06 DIAGNOSIS — I10 Essential (primary) hypertension: Secondary | ICD-10-CM | POA: Diagnosis not present

## 2015-05-06 DIAGNOSIS — Z7901 Long term (current) use of anticoagulants: Secondary | ICD-10-CM

## 2015-05-06 DIAGNOSIS — R7303 Prediabetes: Secondary | ICD-10-CM | POA: Diagnosis not present

## 2015-05-06 DIAGNOSIS — I2510 Atherosclerotic heart disease of native coronary artery without angina pectoris: Secondary | ICD-10-CM | POA: Diagnosis not present

## 2015-05-06 DIAGNOSIS — I482 Chronic atrial fibrillation, unspecified: Secondary | ICD-10-CM

## 2015-05-06 DIAGNOSIS — F329 Major depressive disorder, single episode, unspecified: Secondary | ICD-10-CM

## 2015-05-06 DIAGNOSIS — Z950 Presence of cardiac pacemaker: Secondary | ICD-10-CM

## 2015-05-06 DIAGNOSIS — E559 Vitamin D deficiency, unspecified: Secondary | ICD-10-CM | POA: Diagnosis not present

## 2015-05-06 DIAGNOSIS — K219 Gastro-esophageal reflux disease without esophagitis: Secondary | ICD-10-CM

## 2015-05-06 DIAGNOSIS — E782 Mixed hyperlipidemia: Secondary | ICD-10-CM | POA: Diagnosis not present

## 2015-05-06 DIAGNOSIS — R6889 Other general symptoms and signs: Secondary | ICD-10-CM

## 2015-05-06 DIAGNOSIS — Z79899 Other long term (current) drug therapy: Secondary | ICD-10-CM

## 2015-05-06 DIAGNOSIS — Z0001 Encounter for general adult medical examination with abnormal findings: Secondary | ICD-10-CM

## 2015-05-06 DIAGNOSIS — N6019 Diffuse cystic mastopathy of unspecified breast: Secondary | ICD-10-CM | POA: Diagnosis not present

## 2015-05-06 DIAGNOSIS — Z0000 Encounter for general adult medical examination without abnormal findings: Secondary | ICD-10-CM

## 2015-05-06 DIAGNOSIS — M1990 Unspecified osteoarthritis, unspecified site: Secondary | ICD-10-CM | POA: Diagnosis not present

## 2015-05-06 DIAGNOSIS — F32A Depression, unspecified: Secondary | ICD-10-CM

## 2015-05-06 NOTE — Progress Notes (Signed)
[name], female   DOB: Jun 04, 1927, 82 y.o.   MRN: [id number]  MEDICARE ANNUAL WELLNESS VISIT AND FOLLOW UP  Assessment:    1. Depression, controlled -doesn't like side effects from zoloft.  Planning on stopping.  -cont xanax prn  2. Essential hypertension -DASH Diet -monitor at home  3. Atherosclerosis of native coronary artery of native heart without angina pectoris -followed by cards  4. Chronic atrial fibrillation ([place]) -followed by cards  5. Gastroesophageal reflux disease, esophagitis presence not specified -cont well controlled  6. Osteoarthritis, unspecified osteoarthritis type, unspecified site -currently well controlled -tylenol prn  7. Hyperlipidemia -cont diet and exercise -followed by cards  8. Diffuse cystic mastopathy, unspecified laterality -refuses further mammograms  9. Long term current use of anticoagulant therapy -followed by cards  10. Pacemaker -followed by cards  11. Prediabetes -diet and exercise  12. Vitamin D deficiency -cont supplement  13. Medication management   14. Medicare annual wellness visit, subsequent     Over 30 minutes of exam, counseling, chart review, and critical decision making was performed  Plan:   During the course of the visit the patient was educated and counseled about appropriate screening and preventive services including:    Pneumococcal vaccine   Influenza vaccine  Td vaccine  Prevnar 13  Screening electrocardiogram  Screening mammography  Bone densitometry screening  Colorectal cancer screening  Diabetes screening  Glaucoma screening  Nutrition counseling   Advanced directives: given info/requested copies  Conditions/risks identified: Diabetes is at goal, ACE/ARB therapy: No, Reason not on Ace Inhibitor/ARB therapy:  not indicated. Urinary Incontinence is not an issue: discussed non pharmacology and pharmacology options.  Fall risk: low- discussed PT, home  fall assessment, medications.    Subjective:   Misty Blackwell is a 82 y.o. female who presents for Medicare Annual Wellness Visit and 6 weeks follow-up of depression.  Date of last medicare wellness visit is unknown 12/02/14.   She reports that the depression was much better this season over the holidays than it was last year.  She would like to come off the zoloft.  She feels like a machine on this medication. She is not crying anymore.  She is only having to take the xanax when she needs it.  She reports that she takes a half every evening, but she only uses it once a week.     Medication Review Current Outpatient Prescriptions on File Prior to Visit  Medication Sig Dispense Refill  . ALPRAZolam (XANAX) 1 MG tablet Take 1/2 to 1 tablet 3 x daily if needed for Anxiety 90 tablet 5  . aspirin EC 81 MG tablet Take 81 mg by mouth daily.    . cholecalciferol (VITAMIN D) 1000 UNITS tablet Take 1,000 Units by mouth 3 (three) times daily.     Marland Kitchen diltiazem (TIAZAC) 240 MG 24 hr capsule Take 1 capsule (240 mg total) by mouth daily. 30 capsule 11  . furosemide (LASIX) 40 MG tablet Take 20 mg by mouth daily.    Marland Kitchen gabapentin (NEURONTIN) 300 MG capsule take 1 capsule by mouth twice a day 60 capsule 5  . potassium chloride SA (K-DUR,KLOR-CON) 20 MEQ tablet Take 1 tablet (20 mEq total) by mouth daily. 30 tablet 4  . sertraline (ZOLOFT) 50 MG tablet Take 1/2 tablet daily x 1 week.  Increase to 1 tablet daily after that. 30 tablet 2  . VESICARE 5 MG tablet Take 5 mg by mouth daily.    Marland Kitchen warfarin (  COUMADIN) 3 MG tablet Take 3-4.5 mg by mouth See admin instructions.      No current facility-administered medications on file prior to visit.    Current Problems (verified) Patient Active Problem List   Diagnosis Date Noted  . Medicare annual wellness visit, subsequent 12/01/2014  . CAP (community acquired pneumonia) 08/24/2013  . Prediabetes 08/15/2013  . Vitamin D deficiency 08/15/2013  . Medication  management 08/15/2013  . Pacemaker 09/19/2012  . Atrial fibrillation ([place]) 07/18/2012  . Long term current use of anticoagulant therapy 07/18/2012  . Hyperlipidemia 09/03/2008  . Essential hypertension 09/03/2008  . Coronary atherosclerosis 09/03/2008  . GERD 09/03/2008  . FIBROCYSTIC BREAST DISEASE 09/03/2008  . Osteoarthritis 09/03/2008    Screening Tests Immunization History  Administered Date(s) Administered  . DT 03/19/2015  . Influenza Split 05/04/2011  . Influenza, High Dose Seasonal PF 03/19/2014  . Influenza,inj,quad, With Preservative 05/21/2013  . Influenza-Unspecified 02/04/2015  . Pneumococcal Conjugate-13 03/19/2014  . Pneumococcal Polysaccharide-23 05/04/2011  . Pneumococcal-Unspecified 05/03/2001  . Td 05/04/2003    Preventative care: Last colonoscopy: 2010 Last mammogram: refused  Prior vaccinations: TD or Tdap: 2016  Influenza: 2016  Pneumococcal: 2003 Prevnar13: 2015 Shingles/Zostavax: Declined  Names of Other Physician/Practitioners you currently use: 1. [facility]- here for primary care 2. Dr. Katy Fitch , eye doctor, last visit 2016 3. Dr. , dentist, last visit 2016 Patient Care Team: [name], MD as PCP - General (Internal Medicine) [name], MD as Consulting Physician (Urology) Penni Bombard, MD as Consulting Physician (Neurology) [name], MD as Consulting Physician (Neurology) Sanda Klein, MD as Consulting Physician (Cardiology) [name], MD as Consulting Physician (Gastroenterology)  Past Surgical History  Procedure Laterality Date  . Cardioversion  11/19/2009    successful DCCV from AF to sinus type rhythm  . Cardiac catheterization  08/06/2005    minimal coronary disease predominant RCA; no significant atherosclerosis; new onset sick sinus syndrome and atrial flutter w/ ventricular response, controlled on med therapy; systemic HTN, normal renal arteries  . Cholecystectomy     . Appendectomy    . Abdominal hysterectomy    . Skin cancer resection     Family History  Problem Relation Age of Onset  . Heart disease Mother   . Diabetes Mother   . Heart attack Father   . Heart disease Father   . Cirrhosis Brother    Social History  Substance Use Topics  . Smoking status: Never Smoker   . Smokeless tobacco: Never Used  . Alcohol Use: No    MEDICARE WELLNESS OBJECTIVES: Tobacco use: She does not smoke.  Patient is not a former smoker. If yes, counseling given Alcohol Current alcohol use: none Osteoporosis: postmenopausal estrogen deficiency, History of fracture in the past year: no Fall risk: Low Risk Hearing: normal Visual acuity: normal,  does perform annual eye exam Diet: in general, a "healthy" diet   Physical activity: Current Exercise Habits:: Home exercise routine, Type of exercise: calisthenics, Time (Minutes): 15, Frequency (Times/Week): 4, Weekly Exercise (Minutes/Week): 60, Intensity: Mild Cardiac risk factors: Cardiac Risk Factors include: advanced age (>37men, >70 women);diabetes mellitus;dyslipidemia;family history of premature cardiovascular disease;sedentary lifestyle Depression/mood screen:   Depression screen [facility] 2/9 05/06/2015  Decreased Interest 1  Down, Depressed, Hopeless 1  PHQ - 2 Score 2  Altered sleeping 0  Tired, decreased energy 0  Change in appetite 0  Feeling bad or failure about yourself  0  Trouble concentrating 0  Moving slowly or fidgety/restless  0  Suicidal thoughts 0  PHQ-9 Score 2  Difficult doing work/chores Somewhat difficult    ADLs:  In your present state of health, do you have any difficulty performing the following activities: 05/06/2015 12/02/2014  Hearing? N Y  Vision? N -  Difficulty concentrating or making decisions? N -  Walking or climbing stairs? N -  Dressing or bathing? N -  Doing errands, shopping? N -  Preparing Food and eating ? N -  Using the Toilet? N -  In the past six months, have you  accidently leaked urine? N -  Do you have problems with loss of bowel control? N -  Managing your Medications? N -  Managing your Finances? N -  Housekeeping or managing your Housekeeping? N -     Cognitive Testing  Alert? Yes  Normal Appearance?Yes  Oriented to person? Yes  Place? Yes   Time? Yes  Recall of three objects?  Yes  Can perform simple calculations? Yes  Displays appropriate judgment?Yes  Can read the correct time from a watch face?Yes  EOL planning: Does patient have an advance directive?: Yes Type of Advance Directive: Healthcare Power of Attorney, Living will Does patient want to make changes to advanced directive?: No - Patient declined Copy of advanced directive(s) in chart?: Yes   Objective:   Today's Vitals   05/06/15 1109  BP: 138/66  Pulse: 82  Temp: 98 F (36.7 C)  TempSrc: Temporal  Resp: 16  Height: 5\' 4"  (1.626 m)  Weight: 158 lb (71.668 kg)   Body mass index is 27.11 kg/(m^2).  General appearance: alert, no distress, WD/WN,  female HEENT: normocephalic, sclerae anicteric, TMs pearly, nares patent, no discharge or erythema, pharynx normal Oral cavity: MMM, no lesions Neck: supple, no lymphadenopathy, no thyromegaly, no masses Heart: RRR, normal S1, S2, no murmurs Lungs: CTA bilaterally, no wheezes, rhonchi, or rales Abdomen: +bs, soft, non tender, non distended, no masses, no hepatomegaly, no splenomegaly Musculoskeletal: nontender, no swelling, no obvious deformity Extremities: no edema, no cyanosis, no clubbing Pulses: 2+ symmetric, upper and lower extremities, normal cap refill Neurological: alert, oriented x 3, CN2-12 intact, strength normal upper extremities and lower extremities, sensation normal throughout, DTRs 2+ throughout, no cerebellar signs, gait normal Psychiatric: normal affect, behavior normal, pleasant  Breast: defer Gyn: defer Rectal: defer   Medicare Attestation I have personally reviewed: The patient's medical and  social history Their use of alcohol, tobacco or illicit drugs Their current medications and supplements The patient's functional ability including ADLs,fall risks, home safety risks, cognitive, and hearing and visual impairment Diet and physical activities Evidence for depression or mood disorders  The patient's weight, height, BMI, and visual acuity have been recorded in the chart.  I have made referrals, counseling, and provided education to the patient based on review of the above and I have provided the patient with a written personalized care plan for preventive services.     Starlyn Skeans, PA-C   05/06/2015

## 2015-05-06 NOTE — Patient Instructions (Addendum)
Please take a quarter tablet daily of the zoloft for 1 week. Then you can stop taking the zoloft.    Please call the office if your moods drastically worsen.

## 2015-05-07 ENCOUNTER — Ambulatory Visit (INDEPENDENT_AMBULATORY_CARE_PROVIDER_SITE_OTHER): Payer: Medicare Other | Admitting: Pharmacist Clinician (PhC)/ Clinical Pharmacy Specialist

## 2015-05-07 DIAGNOSIS — I4891 Unspecified atrial fibrillation: Secondary | ICD-10-CM

## 2015-05-07 DIAGNOSIS — Z7901 Long term (current) use of anticoagulants: Secondary | ICD-10-CM | POA: Diagnosis not present

## 2015-05-07 LAB — POCT INR: INR: 2.3

## 2015-05-19 ENCOUNTER — Telehealth: Payer: Self-pay | Admitting: Pharmacist Clinician (PhC)/ Clinical Pharmacy Specialist

## 2015-05-19 ENCOUNTER — Ambulatory Visit (INDEPENDENT_AMBULATORY_CARE_PROVIDER_SITE_OTHER): Payer: Medicare Other | Admitting: Internal Medicine

## 2015-05-19 ENCOUNTER — Encounter: Payer: Self-pay | Admitting: Internal Medicine

## 2015-05-19 VITALS — BP 156/80 | HR 88 | Temp 98.0°F | Resp 18 | Ht 64.0 in | Wt 156.0 lb

## 2015-05-19 DIAGNOSIS — J069 Acute upper respiratory infection, unspecified: Secondary | ICD-10-CM | POA: Diagnosis not present

## 2015-05-19 MED ORDER — AZITHROMYCIN 250 MG PO TABS: ORAL_TABLET | ORAL | Status: DC

## 2015-05-19 MED ORDER — BENZONATATE 200 MG PO CAPS: 200.0000 mg | ORAL_CAPSULE | Freq: Three times a day (TID) | ORAL | Status: DC | PRN

## 2015-05-19 MED ORDER — PREDNISONE 20 MG PO TABS: ORAL_TABLET | ORAL | Status: DC

## 2015-05-19 NOTE — Telephone Encounter (Signed)
Pt went to PCP today, has sinus infection.  Was given zpak, prednisone taper and tessalon.  She is calling to clarify how to adjust warfarin while on these.  Returned call, advised that she decrease her dose to 3 mg daily for this week, then return to her normal schedule next week.  Patient voiced understanding.

## 2015-05-19 NOTE — Progress Notes (Signed)
[name], female   DOB: [birth year], 82 y.o.   MRN: [id number]  HPI  Patient presents to the office for evaluation of cough, sinus congestion, and sore throat.  It has been going on for 1 weeks.  Patient reports night > day, dry, barky, worse with lying down.  They also endorse change in voice, chills, postnasal drip and nasal congestion, sinus pressure, headache, sore throat, bilateral ear congestion..  They have tried none.  They report that nothing has worked.  They admits to other sick contacts.  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    Review of Systems  Constitutional: Positive for chills and malaise/fatigue. Negative for fever.  HENT: Positive for congestion, ear pain and sore throat.   Respiratory: Positive for cough. Negative for sputum production, shortness of breath and wheezing.   Cardiovascular: Negative for chest pain, palpitations and leg swelling.  Neurological: Positive for headaches.    PE:  Filed Vitals:   05/19/15 1419  BP: 156/80  Pulse: 88  Temp: 98 F (36.7 C)  Resp: 18    General:  Alert and non-toxic, WDWN, NAD HEENT: NCAT, PERLA, EOM normal, no occular discharge or erythema.  Nasal mucosal edema with sinus tenderness to palpation.  Oropharynx clear with minimal oropharyngeal edema and erythema.  Mucous membranes moist and pink. Neck:  Cervical adenopathy Chest:  RRR no MRGs.  Lungs clear to auscultation A&P with no wheezes rhonchi or rales.   Abdomen: +BS x 4 quadrants, soft, non-tender, no guarding, rigidity, or rebound. Skin: warm and dry no rash Neuro: A&Ox4, CN II-XII grossly intact  Assessment and Plan:   1. Acute URI -nasal saline -claritin -flonase - azithromycin (ZITHROMAX Z-PAK) 250 MG tablet; 2 po day one, then 1 daily x 4 days  Dispense: 6 tablet; Refill: 0 - predniSONE (DELTASONE) 20 MG tablet; 3 tabs po daily x 3 days, then 2 tabs x 3 days, then 1.5 tabs x 3 days, then 1 tab x 3 days, then 0.5 tabs x 3 days  Dispense: 27  tablet; Refill: 0 - benzonatate (TESSALON) 200 MG capsule; Take 1 capsule (200 mg total) by mouth 3 (three) times daily as needed for cough.  Dispense: 60 capsule; Refill: 1   \

## 2015-05-19 NOTE — Patient Instructions (Signed)
Please cut your regular coumadin dose in half while you take the zpak.    Please take prednisone as prescribed until it is gone.    Please take the tessalon tablets as needed for severe coughing.  Please take claritin or loratidine daily until the cough and congestion resolves.  Please take zpak as prescribed until gone.  Please use flonase 2 sprays in each nostril at bedtime.    Please call office if breathing gets worse.

## 2015-06-04 ENCOUNTER — Ambulatory Visit (INDEPENDENT_AMBULATORY_CARE_PROVIDER_SITE_OTHER): Payer: Medicare Other | Admitting: Pharmacist Clinician (PhC)/ Clinical Pharmacy Specialist

## 2015-06-04 DIAGNOSIS — I4891 Unspecified atrial fibrillation: Secondary | ICD-10-CM | POA: Diagnosis not present

## 2015-06-04 DIAGNOSIS — Z7901 Long term (current) use of anticoagulants: Secondary | ICD-10-CM

## 2015-06-04 LAB — POCT INR: INR: 3.6

## 2015-06-23 ENCOUNTER — Ambulatory Visit (INDEPENDENT_AMBULATORY_CARE_PROVIDER_SITE_OTHER): Payer: Medicare Other | Admitting: Pharmacist Clinician (PhC)/ Clinical Pharmacy Specialist

## 2015-06-23 DIAGNOSIS — I4891 Unspecified atrial fibrillation: Secondary | ICD-10-CM | POA: Diagnosis not present

## 2015-06-23 DIAGNOSIS — Z7901 Long term (current) use of anticoagulants: Secondary | ICD-10-CM

## 2015-06-23 LAB — POCT INR: INR: 1.9

## 2015-06-25 ENCOUNTER — Encounter: Payer: Self-pay | Admitting: Internal Medicine

## 2015-06-25 ENCOUNTER — Encounter: Payer: Medicare Other | Admitting: Pharmacist Clinician (PhC)/ Clinical Pharmacy Specialist

## 2015-06-25 ENCOUNTER — Ambulatory Visit (INDEPENDENT_AMBULATORY_CARE_PROVIDER_SITE_OTHER): Payer: Medicare Other | Admitting: Internal Medicine

## 2015-06-25 VITALS — BP 128/80 | HR 84 | Temp 97.9°F | Resp 16 | Ht 64.0 in | Wt 155.2 lb

## 2015-06-25 DIAGNOSIS — R7303 Prediabetes: Secondary | ICD-10-CM | POA: Diagnosis not present

## 2015-06-25 DIAGNOSIS — I4891 Unspecified atrial fibrillation: Secondary | ICD-10-CM

## 2015-06-25 DIAGNOSIS — Z950 Presence of cardiac pacemaker: Secondary | ICD-10-CM | POA: Diagnosis not present

## 2015-06-25 DIAGNOSIS — I10 Essential (primary) hypertension: Secondary | ICD-10-CM

## 2015-06-25 DIAGNOSIS — Z79899 Other long term (current) drug therapy: Secondary | ICD-10-CM | POA: Diagnosis not present

## 2015-06-25 DIAGNOSIS — E559 Vitamin D deficiency, unspecified: Secondary | ICD-10-CM

## 2015-06-25 DIAGNOSIS — K219 Gastro-esophageal reflux disease without esophagitis: Secondary | ICD-10-CM

## 2015-06-25 DIAGNOSIS — E782 Mixed hyperlipidemia: Secondary | ICD-10-CM | POA: Diagnosis not present

## 2015-06-25 LAB — BASIC METABOLIC PANEL WITH GFR
BUN: 17 mg/dL (ref 7–25)
CALCIUM: 9.5 mg/dL (ref 8.6–10.4)
CO2: 31 mmol/L (ref 20–31)
Chloride: 104 mmol/L (ref 98–110)
Creat: 0.87 mg/dL (ref 0.60–0.88)
GFR, EST AFRICAN AMERICAN: 70 mL/min (ref >=60)
GFR, EST NON AFRICAN AMERICAN: 61 mL/min (ref >=60)
Glucose, Bld: 64 mg/dL — ABNORMAL LOW (ref 65–99)
POTASSIUM: 3.6 mmol/L (ref 3.5–5.3)
Sodium: 143 mmol/L (ref 135–146)

## 2015-06-25 LAB — HEPATIC FUNCTION PANEL
ALBUMIN: 4 g/dL (ref 3.6–5.1)
ALK PHOS: 124 U/L (ref 33–130)
ALT: 17 U/L (ref 6–29)
AST: 21 U/L (ref 10–35)
BILIRUBIN INDIRECT: 0.4 mg/dL (ref 0.2–1.2)
BILIRUBIN TOTAL: 0.5 mg/dL (ref 0.2–1.2)
Bilirubin, Direct: 0.1 mg/dL (ref <=0.2)
Total Protein: 6.8 g/dL (ref 6.1–8.1)

## 2015-06-25 LAB — CBC WITH DIFFERENTIAL/PLATELET
Basophils Absolute: 0.1 10*3/uL (ref 0.0–0.1)
Basophils Relative: 1 % (ref 0–1)
Eosinophils Absolute: 0.1 10*3/uL (ref 0.0–0.7)
Eosinophils Relative: 1 % (ref 0–5)
HCT: 45.4 % (ref 36.0–46.0)
HEMOGLOBIN: 15.4 g/dL — AB (ref 12.0–15.0)
LYMPHS ABS: 2.9 10*3/uL (ref 0.7–4.0)
Lymphocytes Relative: 39 % (ref 12–46)
MCH: 31.2 pg (ref 26.0–34.0)
MCHC: 33.9 g/dL (ref 30.0–36.0)
MCV: 91.9 fL (ref 78.0–100.0)
MONO ABS: 1 10*3/uL (ref 0.1–1.0)
MONOS PCT: 13 % — AB (ref 3–12)
MPV: 10.3 fL (ref 8.6–12.4)
NEUTROS ABS: 3.4 10*3/uL (ref 1.7–7.7)
NEUTROS PCT: 46 % (ref 43–77)
PLATELETS: 289 10*3/uL (ref 150–400)
RBC: 4.94 MIL/uL (ref 3.87–5.11)
RDW: 13.4 % (ref 11.5–15.5)
WBC: 7.4 10*3/uL (ref 4.0–10.5)

## 2015-06-25 LAB — LIPID PANEL
CHOLESTEROL: 192 mg/dL (ref 125–200)
HDL: 41 mg/dL — AB (ref >=46)
LDL CALC: 123 mg/dL (ref <130)
TRIGLYCERIDES: 142 mg/dL (ref <150)
Total CHOL/HDL Ratio: 4.7 Ratio (ref <=5.0)
VLDL: 28 mg/dL (ref <30)

## 2015-06-25 LAB — MAGNESIUM: Magnesium: 1.9 mg/dL (ref 1.5–2.5)

## 2015-06-25 LAB — HEMOGLOBIN A1C
Hgb A1c MFr Bld: 5.9 % — ABNORMAL HIGH (ref <5.7)
Mean Plasma Glucose: 123 mg/dL — ABNORMAL HIGH (ref <117)

## 2015-06-25 NOTE — Progress Notes (Signed)
[name], female   DOB: 04-26-1928, 82 y.o.   MRN: [id number]   This very nice 82 y.o. [facility] presents for 6  month follow up with Hypertension, ASHD/pAfib, Hyperlipidemia, Pre-Diabetes and Vitamin D Deficiency.    Patient is treated for HTN since 1998. Patient has d neg Ht caths in 1993 & 2007. In 2007 she had a PPM inserted for SSS. In 2006 she was dx'd w/pAfib and that year had a TIA with R facial paresthesias.  BP has been controlled at home. Today's BP: 128/80 mmHg. Patient has had no complaints of any cardiac type chest pain, palpitations, dyspnea/orthopnea/PND, dizziness, claudication, or dependent edema.   Hyperlipidemia is controlled with diet & meds. Patient denies myalgias or other med SE's. Last Lipids were at goal with Cholesterol 153; HDL 40*; LDL 87; Triglycerides 129 on 03/19/2015.   Also, the patient has history of PreDiabetes since 2011 with A1c 5.9% and elevated insulin 86.  She has had no symptoms of reactive hypoglycemia, diabetic polys, paresthesias or visual blurring.  Last A1c was  5.8% on11/16/2016.     Further, the patient also has history of Vitamin D Deficiency of "13" in 2008 and supplements vitamin D without any suspected side-effects. Last vitamin D was  36 on 12/02/2014.   Medication Sig  . ALPRAZolam  1 MG  Take 1/2 to 1 tablet 3 x daily if needed for Anxiety  . aspirin EC 81 MG Take 81 mg by mouth daily.  Marland Kitchen VITAMIN D 1000 UNITS  Take 1,000 Units by mouth 3 (three) times daily.   Marland Kitchen diltiazem  240 MG 24 hr  Take 1 capsule (240 mg total) by mouth daily.  . furosemide 40 MG Take 20 mg by mouth daily.  Marland Kitchen gabapentin  300 MG  take 1 capsule by mouth twice a day  . potassium cl SA  20 MEQ  Take 1 tablet (20 mEq total) by mouth daily.  Marland Kitchen warfarin (COUMADIN) 3 MG  Take 3-4.5 mg per [place] [facility].   Allergies  Allergen Reactions  . Ace Inhibitors     Unknown   . Acrylic Polymer [Carbomer]   . Augmentin [Amoxicillin-Pot Clavulanate]   .  Chocolate Other (See Comments)    migraine's   . Ciprofloxacin   . Gabapentin Other (See Comments)    Unsteady gait   . Levaquin [Levofloxacin In D5w]   . Zocor [Simvastatin]    PMHx:   Past Medical History  Diagnosis Date  . Coronary artery disease 03/19/2002    R/P Cardiolite - EF 76%; nromal static and dynamic myocardial perfusion images; normal wall motion and endocardial thickening in all vascular territories  . Hypertension   . Atrial fib/flutter, transient   . Skin cancer     s/p surgical removal.  . Pacemaker   . TIA (transient ischemic attack)   . CHF (congestive heart failure) ([place]) 10/29/2009    Echo - EF >55%; normal LV size and systolic function; unable to assess diastolic fcn due to E/A fusion, pulmonary vein flow pattern suggests elevated filling pressure; marked biatrail dilation, mild/mod tricuspid regurgitation; mod pulmonary htn; mild/mod mitral regurgitation; although echocardiographic features are incomplete findings suggest possible infiltrative cardiomyopathy (maybe amyloidosi  . Facial numbness 12/26/2008    carotid doppler - R and L ICAs 0-49% diameter reduction (velocities suggest low end of scale)  . Atrial fibrillation, chronic ([place])   . Peripheral neuropathy ([place])    Immunization History  Administered Date(s) Administered  . DT  03/19/2015  . Influenza Split 05/04/2011  . Influenza, High Dose Seasonal PF 03/19/2014  . Influenza,inj,quad, With Preservative 05/21/2013  . Influenza-Unspecified 02/04/2015  . Pneumococcal Conjugate-13 03/19/2014  . Pneumococcal Polysaccharide-23 05/04/2011  . Pneumococcal-Unspecified 05/03/2001  . Td 05/04/2003   Past Surgical History  Procedure Laterality Date  . Cardioversion  11/19/2009    successful DCCV from AF to sinus type rhythm  . Cardiac catheterization  08/06/2005    minimal coronary disease predominant RCA; no significant atherosclerosis; new onset sick sinus syndrome and atrial flutter w/ ventricular response,  controlled on med therapy; systemic HTN, normal renal arteries  . Cholecystectomy    . Appendectomy    . Abdominal hysterectomy    . Skin cancer resection     FHx:    Reviewed / unchanged  SHx:    Reviewed / unchanged  Systems Review:  Constitutional: Denies fever, chills, wt changes, headaches, insomnia, fatigue, night sweats, change in appetite. Eyes: Denies redness, blurred vision, diplopia, discharge, itchy, watery eyes.  ENT: Denies discharge, congestion, post nasal drip, epistaxis, sore throat, earache, hearing loss, dental pain, tinnitus, vertigo, sinus pain, snoring.  CV: Denies chest pain, palpitations, irregular heartbeat, syncope, dyspnea, diaphoresis, orthopnea, PND, claudication or edema. Respiratory: denies cough, dyspnea, DOE, pleurisy, hoarseness, laryngitis, wheezing.  Gastrointestinal: Denies dysphagia, odynophagia, heartburn, reflux, water brash, abdominal pain or cramps, nausea, vomiting, bloating, diarrhea, constipation, hematemesis, melena, hematochezia  or hemorrhoids. Genitourinary: Denies dysuria, frequency, urgency, nocturia, hesitancy, discharge, hematuria or flank pain. Musculoskeletal: Denies arthralgias, myalgias, stiffness, jt. swelling, pain, limping or strain/sprain.  Skin: Denies pruritus, rash, hives, warts, acne, eczema or change in skin lesion(s). Neuro: No weakness, tremor, incoordination, spasms, paresthesia or pain. Psychiatric: Denies confusion, memory loss or sensory loss. Endo: Denies change in weight, skin or hair change.  Heme/Lymph: No excessive bleeding, bruising or enlarged lymph nodes.  Physical Exam  BP 128/80 mmHg  Pulse 84  Temp(Src) 97.9 F (36.6 C)  Resp 16  Ht 5\' 4"  (1.626 m)  Wt 155 lb 3.2 oz (70.398 kg)  BMI 26.63 kg/m2  Appears well nourished and in no distress. Eyes: PERRLA, EOMs, conjunctiva no swelling or erythema. Sinuses: No frontal/maxillary tenderness ENT/Mouth: EAC's clear, TM's nl w/o erythema, bulging. Nares  clear w/o erythema, swelling, exudates. Oropharynx clear without erythema or exudates. Oral hygiene is good. Tongue normal, non obstructing. Hearing intact.  Neck: Supple. Thyroid nl. Car 2+/2+ without bruits, nodes or JVD. Chest: Respirations nl with BS clear & equal w/o rales, rhonchi, wheezing or stridor.  Cor: Heart sounds normal w/ regular rate and rhythm without sig. murmurs, gallops, clicks, or rubs. Peripheral pulses normal and equal  without edema.  Abdomen: Soft & bowel sounds normal. Non-tender w/o guarding, rebound, hernias, masses, or organomegaly.  Lymphatics: Unremarkable.  Musculoskeletal: Full ROM all peripheral extremities, joint stability, 5/5 strength, and normal gait.  Skin: Warm, dry without exposed rashes, lesions or ecchymosis apparent.  Neuro: Cranial nerves intact, reflexes equal bilaterally. Sensory-motor testing grossly intact. Tendon reflexes grossly intact.  Pysch: Alert & oriented x 3.  Insight and judgement nl & appropriate. No ideations.  Assessment and Plan:  1. Essential hypertension  - TSH  2. Hyperlipidemia  - Lipid panel - TSH  3. Prediabetes  - Hemoglobin A1c - Insulin, random  4. Vitamin D deficiency  - VITAMIN D 25 Hydroxy   5. Gastroesophageal reflux disease   6. Atrial fibrillation ([place])   7. Medication management  - CBC with Differential/Platelet - BASIC METABOLIC PANEL WITH GFR -  Hepatic function panel - Magnesium  8. Pacemaker   Recommended regular exercise, BP monitoring, weight control, and discussed med and SE's. Recommended labs to assess and monitor clinical status. Further disposition pending results of labs. Over 30 minutes of exam, counseling, chart review was performed

## 2015-06-25 NOTE — Patient Instructions (Signed)

## 2015-06-26 LAB — TSH: TSH: 0.85 mIU/L

## 2015-06-26 LAB — INSULIN, RANDOM: INSULIN: 35.7 u[IU]/mL — AB (ref 2.0–19.6)

## 2015-06-26 LAB — VITAMIN D 25 HYDROXY (VIT D DEFICIENCY, FRACTURES): Vit D, 25-Hydroxy: 42 ng/mL (ref 30–100)

## 2015-07-08 ENCOUNTER — Encounter: Payer: Self-pay | Admitting: Cardiovascular Disease

## 2015-07-08 ENCOUNTER — Ambulatory Visit (INDEPENDENT_AMBULATORY_CARE_PROVIDER_SITE_OTHER): Payer: Medicare Other | Admitting: Pharmacist Clinician (PhC)/ Clinical Pharmacy Specialist

## 2015-07-08 ENCOUNTER — Ambulatory Visit (INDEPENDENT_AMBULATORY_CARE_PROVIDER_SITE_OTHER): Payer: Medicare Other | Admitting: Cardiovascular Disease

## 2015-07-08 VITALS — BP 140/84 | HR 84 | Ht 64.5 in | Wt 157.4 lb

## 2015-07-08 DIAGNOSIS — Z7901 Long term (current) use of anticoagulants: Secondary | ICD-10-CM | POA: Diagnosis not present

## 2015-07-08 DIAGNOSIS — I4891 Unspecified atrial fibrillation: Secondary | ICD-10-CM | POA: Diagnosis not present

## 2015-07-08 DIAGNOSIS — I482 Chronic atrial fibrillation, unspecified: Secondary | ICD-10-CM

## 2015-07-08 DIAGNOSIS — I10 Essential (primary) hypertension: Secondary | ICD-10-CM | POA: Diagnosis not present

## 2015-07-08 DIAGNOSIS — I2510 Atherosclerotic heart disease of native coronary artery without angina pectoris: Secondary | ICD-10-CM

## 2015-07-08 LAB — POCT INR: INR: 1.9

## 2015-07-08 NOTE — Progress Notes (Signed)
[name], female   DOB: [birth year], 82 y.o.   MRN: [id number]    Cardiology Office Note    Date:  07/10/2015   [name], DOB December 30, 1927, MRN [id number]  PCP:  Alesia Richards, MD  Cardiologist:   Sanda Klein, MD   Chief Complaint  Patient presents with  . Follow-up    no chest pain, occassional shortness of breath, has edema, has pain or cramping in legs, has lightheadedness or dizziness, has fatigue    History of Present Illness:  Misty Blackwell is a 82 y.o. female who presents for permanent atrial fibrillation with slow ventricular response and pacemaker, remote TIA, HTN, minor CAD, asymptomatic pacemaker-detected NSVT, history of diastolic HF.  She is no longer depressed and is socially much more engaged. She still is clearly mourning the loss of her husband and has occasional hallucinations when he is present with her or touches her on the shoulder.  Pacemaker interrogation shows normal device function. She has approximate 6 years of estimated generator longevity. She is in permanent atrial fibrillation with slow ventricular response and requires 84% ventricular pacing on the with very rare episodes of high ventricular rate. Her device is a Medtronic Adapta dual-chamber device implanted in 2011 now programmed VVIR for permanent atrial fibrillation.  She denies any neurological events or bleeding complications on warfarin anticoagulation. Today INR is 1.9.    Past Medical History  Diagnosis Date  . Coronary artery disease 03/19/2002    R/P Cardiolite - EF 76%; nromal static and dynamic myocardial perfusion images; normal wall motion and endocardial thickening in all vascular territories  . Hypertension   . Atrial fib/flutter, transient   . Skin cancer     s/p surgical removal.  . Pacemaker   . TIA (transient ischemic attack)   . CHF (congestive heart failure) ([place]) 10/29/2009    Echo - EF >55%; normal LV size and systolic function; unable to  assess diastolic fcn due to E/A fusion, pulmonary vein flow pattern suggests elevated filling pressure; marked biatrail dilation, mild/mod tricuspid regurgitation; mod pulmonary htn; mild/mod mitral regurgitation; although echocardiographic features are incomplete findings suggest possible infiltrative cardiomyopathy (maybe amyloidosi  . Facial numbness 12/26/2008    carotid doppler - R and L ICAs 0-49% diameter reduction (velocities suggest low end of scale)  . Atrial fibrillation, chronic ([place])   . Peripheral neuropathy [facility])     Past Surgical History  Procedure Laterality Date  . Cardioversion  11/19/2009    successful DCCV from AF to sinus type rhythm  . Cardiac catheterization  08/06/2005    minimal coronary disease predominant RCA; no significant atherosclerosis; new onset sick sinus syndrome and atrial flutter w/ ventricular response, controlled on med therapy; systemic HTN, normal renal arteries  . Cholecystectomy    . Appendectomy    . Abdominal hysterectomy    . Skin cancer resection      Outpatient Prescriptions Prior to Visit  Medication Sig Dispense Refill  . cholecalciferol (VITAMIN D) 1000 UNITS tablet Take 1,000 Units by mouth 3 (three) times daily.     Marland Kitchen diltiazem (TIAZAC) 240 MG 24 hr capsule Take 1 capsule (240 mg total) by mouth daily. 30 capsule 11  . furosemide (LASIX) 40 MG tablet Take 20 mg by mouth daily.    Marland Kitchen gabapentin (NEURONTIN) 300 MG capsule take 1 capsule by mouth twice a day 60 capsule 5  . potassium chloride SA (K-DUR,KLOR-CON) 20 MEQ tablet Take 1 tablet (20 mEq total) by  mouth daily. 30 tablet 4  . warfarin (COUMADIN) 3 MG tablet Take 3-4.5 mg by mouth See admin instructions.     Marland Kitchen aspirin EC 81 MG tablet Take 81 mg by mouth daily.     No facility-administered medications prior to visit.     Allergies:   Ace inhibitors; Acrylic polymer; Augmentin; Chocolate; Ciprofloxacin; Gabapentin; Levaquin; and Zocor   Social History   Social History  . Marital  Status: Married    Spouse Name: N/A  . Number of Children: 0  . Years of Education: N/A   Social History Main Topics  . Smoking status: Never Smoker   . Smokeless tobacco: Never Used  . Alcohol Use: No  . Drug Use: No  . Sexual Activity: No   Other Topics Concern  . None   Social History Narrative   Widowed.  Lives alone.  Ambulates independently.     Family History:  The patient's family history includes Cirrhosis in her brother; Diabetes in her mother; Heart attack in her father; Heart disease in her father and mother.   ROS:   Please see the history of present illness.    ROS All other systems reviewed and are negative.   PHYSICAL EXAM:   VS:  BP 140/84 mmHg  Pulse 84  Ht 5' 4.5" (1.638 m)  Wt 71.413 kg (157 lb 7 oz)  BMI 26.62 kg/m2   GEN: Well nourished, well developed, in no acute distress HEENT: normal Neck: no JVD, carotid bruits, or masses Cardiac: Irregular ; no murmurs, rubs, or gallops,no edema healthy left subclavian pacemaker site Respiratory:  clear to auscultation bilaterally, normal work of breathing GI: soft, nontender, nondistended, + BS MS: no deformity or atrophy Skin: warm and dry, no rash Neuro:  Alert and Oriented x 3, Strength and sensation are intact Psych: euthymic mood, full affect  Wt Readings from Last 3 Encounters:  07/08/15 71.413 kg (157 lb 7 oz)  06/25/15 70.398 kg (155 lb 3.2 oz)  05/19/15 70.761 kg (156 lb)      Studies/Labs Reviewed:   EKG:  EKG is ordered today.  The ekg ordered today demonstrates Atrial fibrillation with intermittent ventricular pacing, underlying left ventricular hypertrophy  Recent Labs: 06/25/2015: ALT 17; BUN 17; Creat 0.87; Hemoglobin 15.4*; Magnesium 1.9; Platelets 289; Potassium 3.6; Sodium 143; TSH 0.85   Lipid Panel    Component Value Date/Time   CHOL 192 06/25/2015 1037   TRIG 142 06/25/2015 1037   HDL 41* 06/25/2015 1037   CHOLHDL 4.7 06/25/2015 1037   VLDL 28 06/25/2015 1037   LDLCALC  123 06/25/2015 1037     ASSESSMENT:    1. Atherosclerosis of native coronary artery of native heart without angina pectoris   2. Essential hypertension   3. Chronic atrial fibrillation ([place])   4. Long term current use of anticoagulant therapy      PLAN:  In order of problems listed above:  1. CAD: Currently asymptomatic. She is intolerant to statins and does not want to try to take these again. 2. HTN: Fairly well-controlled  3. AFib with slow ventricular response and high percentage of ventricular pacing. Consider reducing the diltiazem dose, still has occasional episodes of high ventricular rate. High embolic risk with history of previous transient ischemic attack (CHADSVasc 6: age 22, TIA 2, HTN, CAD).  4.  Well tolerated, no bleeding complications. Continue warfarin. Dose adjusted today.   Medication Adjustments/Labs and Tests Ordered: Current medicines are reviewed at length with the patient today.  Concerns regarding medicines are outlined above.  Medication changes, Labs and Tests ordered today are listed in the Patient Instructions below. Patient Instructions  Remote monitoring is used to monitor your Pacemaker from home. This monitoring reduces the number of office visits required to check your device to one time per year. It allows [place] to monitor the functioning of your device to ensure it is working properly. You are scheduled for a device check from home on October 09, 2015. You may send your transmission at any time that day. If you have a wireless device, the transmission will be sent automatically. After your physician reviews your transmission, you will receive a postcard with your next transmission date.  Dr. Sallyanne Kuster recommends that you schedule a follow-up appointment in: [place] ([facility])         Mikael Spray, MD  07/10/2015 1:27 PM    [facility] [address], [place], [place]  [address] Phone: [phone]; Fax: [phone]

## 2015-07-08 NOTE — Patient Instructions (Signed)
Remote monitoring is used to monitor your Pacemaker from home. This monitoring reduces the number of office visits required to check your device to one time per year. It allows [place] to monitor the functioning of your device to ensure it is working properly. You are scheduled for a device check from home on October 09, 2015. You may send your transmission at any time that day. If you have a wireless device, the transmission will be sent automatically. After your physician reviews your transmission, you will receive a postcard with your next transmission date.  Dr. Sallyanne Kuster recommends that you schedule a follow-up appointment in: [place] ([facility])

## 2015-07-21 ENCOUNTER — Other Ambulatory Visit: Payer: Self-pay | Admitting: Internal Medicine

## 2015-07-25 LAB — CUP PACEART INCLINIC DEVICE CHECK
Battery Impedance: 713 Ohm
Battery Remaining Longevity: 72 mo
Battery Voltage: 2.79 V
Brady Statistic RV Percent Paced: 84 %
Implantable Lead Implant Date: 20110428
Implantable Lead Location: 753859
Implantable Lead Model: 4092
Lead Channel Setting Pacing Amplitude: 2.5 V
MDC IDC LEAD IMPLANT DT: 20110428
MDC IDC LEAD LOCATION: 753860
MDC IDC LEAD MODEL: 4592
MDC IDC MSMT LEADCHNL RA IMPEDANCE VALUE: 67 Ohm
MDC IDC MSMT LEADCHNL RV IMPEDANCE VALUE: 702 Ohm
MDC IDC SESS DTM: 20170307153619
MDC IDC SET LEADCHNL RV PACING PULSEWIDTH: 0.4 ms
MDC IDC SET LEADCHNL RV SENSING SENSITIVITY: 5.6 mV

## 2015-07-25 LAB — CUP PACEART REMOTE DEVICE CHECK
Battery Remaining Longevity: 72 mo
Date Time Interrogation Session: 20170307153619
Implantable Lead Implant Date: 20110428
Implantable Lead Model: 4592
Lead Channel Impedance Value: 67 Ohm
Lead Channel Setting Sensing Sensitivity: 5.6 mV
MDC IDC LEAD IMPLANT DT: 20110428
MDC IDC LEAD LOCATION: 753859
MDC IDC LEAD LOCATION: 753860
MDC IDC MSMT BATTERY IMPEDANCE: 713 Ohm
MDC IDC MSMT BATTERY VOLTAGE: 2.79 V
MDC IDC MSMT LEADCHNL RV IMPEDANCE VALUE: 702 Ohm
MDC IDC SET LEADCHNL RV PACING AMPLITUDE: 2.5 V
MDC IDC SET LEADCHNL RV PACING PULSEWIDTH: 0.4 ms
MDC IDC STAT BRADY RV PERCENT PACED: 84 %

## 2015-07-29 ENCOUNTER — Ambulatory Visit (INDEPENDENT_AMBULATORY_CARE_PROVIDER_SITE_OTHER): Payer: Medicare Other | Admitting: Pharmacist Clinician (PhC)/ Clinical Pharmacy Specialist

## 2015-07-29 DIAGNOSIS — Z7901 Long term (current) use of anticoagulants: Secondary | ICD-10-CM | POA: Diagnosis not present

## 2015-07-29 DIAGNOSIS — I4891 Unspecified atrial fibrillation: Secondary | ICD-10-CM | POA: Diagnosis not present

## 2015-07-29 LAB — POCT INR: INR: 2

## 2015-08-26 ENCOUNTER — Ambulatory Visit (INDEPENDENT_AMBULATORY_CARE_PROVIDER_SITE_OTHER): Payer: Medicare Other | Admitting: Pharmacist

## 2015-08-26 DIAGNOSIS — I4891 Unspecified atrial fibrillation: Secondary | ICD-10-CM

## 2015-08-26 DIAGNOSIS — Z7901 Long term (current) use of anticoagulants: Secondary | ICD-10-CM | POA: Diagnosis not present

## 2015-08-26 LAB — POCT INR: INR: 2.8

## 2015-09-21 ENCOUNTER — Other Ambulatory Visit: Payer: Self-pay | Admitting: Cardiovascular Disease

## 2015-09-22 ENCOUNTER — Encounter: Payer: Self-pay | Admitting: Internal Medicine

## 2015-09-22 ENCOUNTER — Ambulatory Visit (INDEPENDENT_AMBULATORY_CARE_PROVIDER_SITE_OTHER): Payer: Medicare Other | Admitting: Internal Medicine

## 2015-09-22 VITALS — BP 140/66 | HR 78 | Temp 97.8°F | Resp 16 | Ht 64.0 in | Wt 155.0 lb

## 2015-09-22 DIAGNOSIS — M1990 Unspecified osteoarthritis, unspecified site: Secondary | ICD-10-CM

## 2015-09-22 DIAGNOSIS — E559 Vitamin D deficiency, unspecified: Secondary | ICD-10-CM | POA: Diagnosis not present

## 2015-09-22 DIAGNOSIS — Z7901 Long term (current) use of anticoagulants: Secondary | ICD-10-CM | POA: Diagnosis not present

## 2015-09-22 DIAGNOSIS — R7303 Prediabetes: Secondary | ICD-10-CM | POA: Diagnosis not present

## 2015-09-22 DIAGNOSIS — R6889 Other general symptoms and signs: Secondary | ICD-10-CM | POA: Diagnosis not present

## 2015-09-22 DIAGNOSIS — I2510 Atherosclerotic heart disease of native coronary artery without angina pectoris: Secondary | ICD-10-CM

## 2015-09-22 DIAGNOSIS — Z0001 Encounter for general adult medical examination with abnormal findings: Secondary | ICD-10-CM

## 2015-09-22 DIAGNOSIS — Z950 Presence of cardiac pacemaker: Secondary | ICD-10-CM

## 2015-09-22 DIAGNOSIS — J309 Allergic rhinitis, unspecified: Secondary | ICD-10-CM

## 2015-09-22 DIAGNOSIS — I482 Chronic atrial fibrillation, unspecified: Secondary | ICD-10-CM

## 2015-09-22 DIAGNOSIS — I10 Essential (primary) hypertension: Secondary | ICD-10-CM | POA: Diagnosis not present

## 2015-09-22 DIAGNOSIS — E782 Mixed hyperlipidemia: Secondary | ICD-10-CM | POA: Diagnosis not present

## 2015-09-22 DIAGNOSIS — N6019 Diffuse cystic mastopathy of unspecified breast: Secondary | ICD-10-CM | POA: Diagnosis not present

## 2015-09-22 DIAGNOSIS — Z79899 Other long term (current) drug therapy: Secondary | ICD-10-CM | POA: Diagnosis not present

## 2015-09-22 DIAGNOSIS — Z0000 Encounter for general adult medical examination without abnormal findings: Secondary | ICD-10-CM

## 2015-09-22 DIAGNOSIS — K219 Gastro-esophageal reflux disease without esophagitis: Secondary | ICD-10-CM

## 2015-09-22 LAB — HEPATIC FUNCTION PANEL
ALK PHOS: 112 U/L (ref 33–130)
ALT: 16 U/L (ref 6–29)
AST: 19 U/L (ref 10–35)
Albumin: 4 g/dL (ref 3.6–5.1)
BILIRUBIN DIRECT: 0.1 mg/dL (ref <=0.2)
BILIRUBIN INDIRECT: 0.5 mg/dL (ref 0.2–1.2)
BILIRUBIN TOTAL: 0.6 mg/dL (ref 0.2–1.2)
Total Protein: 6.5 g/dL (ref 6.1–8.1)

## 2015-09-22 LAB — TSH: TSH: 0.98 m[IU]/L

## 2015-09-22 LAB — BASIC METABOLIC PANEL WITH GFR
BUN: 12 mg/dL (ref 7–25)
CHLORIDE: 103 mmol/L (ref 98–110)
CO2: 31 mmol/L (ref 20–31)
Calcium: 8.8 mg/dL (ref 8.6–10.4)
Creat: 0.77 mg/dL (ref 0.60–0.88)
GFR, EST NON AFRICAN AMERICAN: 70 mL/min (ref >=60)
GFR, Est African American: 81 mL/min (ref >=60)
Glucose, Bld: 86 mg/dL (ref 65–99)
POTASSIUM: 3.8 mmol/L (ref 3.5–5.3)
SODIUM: 141 mmol/L (ref 135–146)

## 2015-09-22 LAB — CBC WITH DIFFERENTIAL/PLATELET
BASOS ABS: 0 {cells}/uL (ref 0–200)
BASOS PCT: 0 %
EOS PCT: 2 %
Eosinophils Absolute: 146 cells/uL (ref 15–500)
HCT: 43.4 % (ref 35.0–45.0)
HEMOGLOBIN: 14.4 g/dL (ref 11.7–15.5)
LYMPHS ABS: 2555 {cells}/uL (ref 850–3900)
Lymphocytes Relative: 35 %
MCH: 30.4 pg (ref 27.0–33.0)
MCHC: 33.2 g/dL (ref 32.0–36.0)
MCV: 91.6 fL (ref 80.0–100.0)
MONOS PCT: 9 %
MPV: 10.9 fL (ref 7.5–12.5)
Monocytes Absolute: 657 cells/uL (ref 200–950)
Neutro Abs: 3942 cells/uL (ref 1500–7800)
Neutrophils Relative %: 54 %
PLATELETS: 218 10*3/uL (ref 140–400)
RBC: 4.74 MIL/uL (ref 3.80–5.10)
RDW: 13.2 % (ref 11.0–15.0)
WBC: 7.3 10*3/uL (ref 3.8–10.8)

## 2015-09-22 LAB — LIPID PANEL
Cholesterol: 143 mg/dL (ref 125–200)
HDL: 40 mg/dL — ABNORMAL LOW (ref >=46)
LDL CALC: 82 mg/dL (ref <130)
Total CHOL/HDL Ratio: 3.6 Ratio (ref <=5.0)
Triglycerides: 106 mg/dL (ref <150)
VLDL: 21 mg/dL (ref <30)

## 2015-09-22 MED ORDER — FLUTICASONE PROPIONATE 50 MCG/ACT NA SUSP: 2.0000 | Freq: Every day | NASAL | Status: DC

## 2015-09-22 NOTE — Progress Notes (Signed)
MEDICARE ANNUAL WELLNESS VISIT AND FOLLOW UP  Assessment:    1. Essential hypertension -cont meds -currently well controlled -dash diet -exercise as tolerated - TSH  2. Hyperlipidemia -cont meds - Lipid panel  3. Prediabetes -diet and exercise - Hemoglobin A1c  4. Vitamin D deficiency -cont supplement  5. Medication management  - CBC with Differential/Platelet - BASIC METABOLIC PANEL WITH GFR - Hepatic function panel  6. Atherosclerosis of native coronary artery of native heart without angina pectoris -followed by cards  7. Chronic atrial fibrillation ([place]) -followed by cards  8. Gastroesophageal reflux disease, esophagitis presence not specified -avoid trigger foods -not currently on meds  9. Osteoarthritis, unspecified osteoarthritis type, unspecified site -tylenol and gabapentin prn  10. Diffuse cystic mastopathy, unspecified laterality -patient refuses further mammograms  11. Long term current use of anticoagulant therapy -followed by cards  12. Pacemaker -followed by cards  13. Medicare annual wellness visit, subsequent -due next year  60. Allergic rhinitis, unspecified allergic rhinitis type -flonase -cont daily antihistamine     Over 30 minutes of exam, counseling, chart review, and critical decision making was performed  Plan:   During the course of the visit the patient was educated and counseled about appropriate screening and preventive services including:    Pneumococcal vaccine   Influenza vaccine  Td vaccine  Prevnar 13  Screening electrocardiogram  Screening mammography  Bone densitometry screening  Colorectal cancer screening  Diabetes screening  Glaucoma screening  Nutrition counseling   Advanced directives: given info/requested copies  Conditions/risks identified: Diabetes is not at goal, ACE/ARB therapy: No, Reason not on Ace Inhibitor/ARB therapy:  hypotension Urinary Incontinence is not an issue:  discussed non pharmacology and pharmacology options.  Fall risk: low- discussed PT, home fall assessment, medications.    Subjective:   Misty Blackwell is a 82 y.o. female who presents for Medicare Annual Wellness Visit and 3 month follow up on hypertension, prediabetes, hyperlipidemia, vitamin D def.  Date of last medicare wellness visit is unknown.   Her blood pressure has been controlled at home, today their BP is BP: 140/66 mmHg She does not workout. She denies chest pain, shortness of breath, dizziness.   She is on cholesterol medication and denies myalgias. Her cholesterol is at goal. The cholesterol last visit was:   Lab Results  Component Value Date   CHOL 192 06/25/2015   HDL 41* 06/25/2015   LDLCALC 123 06/25/2015   TRIG 142 06/25/2015   CHOLHDL 4.7 06/25/2015   She has been working on diet and exercise for prediabetes, and denies foot ulcerations, hyperglycemia, hypoglycemia , increased appetite, nausea, paresthesia of the feet, polydipsia, polyuria, visual disturbances, vomiting and weight loss. Last A1C in the office was:  Lab Results  Component Value Date   HGBA1C 5.9* 06/25/2015   Last GFR Lab Results  Component Value Date   GFRNONAA 61 06/25/2015   Lab Results  Component Value Date   GFRAA 70 06/25/2015   Patient is on Vitamin D supplement. Lab Results  Component Value Date   VD25OH 42 06/25/2015      Medication Review Current Outpatient Prescriptions on File Prior to Visit  Medication Sig Dispense Refill  . cholecalciferol (VITAMIN D) 1000 UNITS tablet Take 1,000 Units by mouth 3 (three) times daily.     Marland Kitchen diltiazem (TIAZAC) 240 MG 24 hr capsule Take 1 capsule (240 mg total) by mouth daily. 30 capsule 11  . furosemide (LASIX) 40 MG tablet Take 20 mg by mouth daily.    Marland Kitchen  gabapentin (NEURONTIN) 300 MG capsule take 1 capsule by mouth twice a day 60 capsule 5  . potassium chloride SA (K-DUR,KLOR-CON) 20 MEQ tablet Take 1 tablet (20 mEq total) by mouth  daily. 30 tablet 4  . warfarin (COUMADIN) 3 MG tablet Take 3-4.5 mg by mouth See admin instructions.      No current facility-administered medications on file prior to visit.    Current Problems (verified) Patient Active Problem List   Diagnosis Date Noted  . Medicare annual wellness visit, subsequent 12/01/2014  . Prediabetes 08/15/2013  . Vitamin D deficiency 08/15/2013  . Medication management 08/15/2013  . Pacemaker 09/19/2012  . Atrial fibrillation ([place]) 07/18/2012  . Long term current use of anticoagulant therapy 07/18/2012  . Hyperlipidemia 09/03/2008  . Essential hypertension 09/03/2008  . Coronary atherosclerosis 09/03/2008  . GERD 09/03/2008  . FIBROCYSTIC BREAST DISEASE 09/03/2008  . Osteoarthritis 09/03/2008    Screening Tests Immunization History  Administered Date(s) Administered  . DT 03/19/2015  . Influenza Split 05/04/2011  . Influenza, High Dose Seasonal PF 03/19/2014  . Influenza,inj,quad, With Preservative 05/21/2013  . Influenza-Unspecified 02/04/2015  . Pneumococcal Conjugate-13 03/19/2014  . Pneumococcal Polysaccharide-23 05/04/2011  . Pneumococcal-Unspecified 05/03/2001  . Td 05/04/2003    Preventative care: Last colonoscopy: 2010 Last mammogram: Has not had, refused further unless she has a palpable mass DEXA: Declined  Prior vaccinations: TD or Tdap: 2016  Influenza: 2016  Pneumococcal: 2013 Prevnar13: 2015 Shingles/Zostavax: Declined  Names of Other Physician/Practitioners you currently use: 1. [facility]- here for primary care 2. Dr. Katy Fitch, eye doctor, last visit 2016 3. Dr. Ronnald Ramp, dentist, last visit 2017  Patient Care Team: [name], MD as PCP - General (Internal Medicine) [name], MD as Consulting Physician (Urology) Penni Bombard, MD as Consulting Physician (Neurology) [name], MD as Consulting Physician (Neurology) Sanda Klein, MD as Consulting Physician  (Cardiology) [name], MD as Consulting Physician (Gastroenterology)  Past Surgical History  Procedure Laterality Date  . Cardioversion  11/19/2009    successful DCCV from AF to sinus type rhythm  . Cardiac catheterization  08/06/2005    minimal coronary disease predominant RCA; no significant atherosclerosis; new onset sick sinus syndrome and atrial flutter w/ ventricular response, controlled on med therapy; systemic HTN, normal renal arteries  . Cholecystectomy    . Appendectomy    . Abdominal hysterectomy    . Skin cancer resection     Family History  Problem Relation Age of Onset  . Heart disease Mother   . Diabetes Mother   . Heart attack Father   . Heart disease Father   . Cirrhosis Brother    Social History  Substance Use Topics  . Smoking status: Never Smoker   . Smokeless tobacco: Never Used  . Alcohol Use: No   Allergies  Allergen Reactions  . Ace Inhibitors     Unknown   . Acrylic Polymer [Carbomer]   . Augmentin [Amoxicillin-Pot Clavulanate]   . Chocolate Other (See Comments)    migraine's   . Ciprofloxacin   . Gabapentin Other (See Comments)    Unsteady gait   . Levaquin [Levofloxacin In D5w]   . Zocor [Simvastatin]     MEDICARE WELLNESS OBJECTIVES: Tobacco use: She does not smoke.  Patient is not a former smoker. If yes, counseling given Alcohol Current alcohol use: none Diet: well balanced Physical activity: Current Exercise Habits: Home exercise routine, Type of exercise: calisthenics, Time (Minutes): 30, Frequency (Times/Week): 7, Weekly  Exercise (Minutes/Week): 210, Intensity: Moderate Cardiac risk factors: Cardiac Risk Factors include: advanced age (>67men, >72 women);dyslipidemia;hypertension;sedentary lifestyle Depression/mood screen:   Depression screen [facility] 2/9 09/22/2015  Decreased Interest 0  Down, Depressed, Hopeless 0  PHQ - 2 Score 0  Altered sleeping -  Tired, decreased energy -  Change in appetite -  Feeling bad or failure  about yourself  -  Trouble concentrating -  Moving slowly or fidgety/restless -  Suicidal thoughts -  PHQ-9 Score -  Difficult doing work/chores -    ADLs:  In your present state of health, do you have any difficulty performing the following activities: 09/22/2015 06/25/2015  Hearing? N -  Vision? N N  Difficulty concentrating or making decisions? N N  Walking or climbing stairs? N N  Dressing or bathing? N N  Doing errands, shopping? N N  Preparing Food and eating ? N -  Using the Toilet? N -  In the past six months, have you accidently leaked urine? N -  Do you have problems with loss of bowel control? N -  Managing your Medications? N -  Managing your Finances? N -  Housekeeping or managing your Housekeeping? N -     Cognitive Testing  Alert? Yes  Normal Appearance?Yes  Oriented to person? Yes  Place? Yes   Time? Yes  Recall of three objects?  Yes  Can perform simple calculations? Yes  Displays appropriate judgment?Yes  Can read the correct time from a watch face?Yes  EOL planning: Does patient have an advance directive?: Yes Type of Advance Directive: Healthcare Power of Attorney, Living will Does patient want to make changes to advanced directive?: No - Patient declined Copy of advanced directive(s) in chart?: Yes   Objective:   Today's Vitals   09/22/15 0919  BP: 140/66  Pulse: 78  Temp: 97.8 F (36.6 C)  TempSrc: Temporal  Resp: 16  Height: 5\' 4"  (1.626 m)  Weight: 155 lb (70.308 kg)   Body mass index is 26.59 kg/(m^2).  General appearance: alert, no distress, WD/WN,  female HEENT: normocephalic, sclerae anicteric, TMs pearly, nares patent, no discharge or erythema, pharynx normal Oral cavity: MMM, no lesions Neck: supple, no lymphadenopathy, no thyromegaly, no masses Heart: Ireg ireg, normal S1, S2, no murmurs Lungs: CTA bilaterally, no wheezes, rhonchi, or rales Abdomen: +bs, soft, non tender, non distended, no masses, no hepatomegaly, no  splenomegaly Musculoskeletal: nontender, no swelling, no obvious deformity Extremities: no edema, no cyanosis, no clubbing Pulses: 2+ symmetric, upper and lower extremities, normal cap refill Neurological: alert, oriented x 3, CN2-12 intact, strength normal upper extremities and lower extremities, sensation normal throughout, DTRs 2+ throughout, no cerebellar signs, gait normal Psychiatric: normal affect, behavior normal, pleasant  Breast: defer Gyn: defer Rectal: defer   Medicare Attestation I have personally reviewed: The patient's medical and social history Their use of alcohol, tobacco or illicit drugs Their current medications and supplements The patient's functional ability including ADLs,fall risks, home safety risks, cognitive, and hearing and visual impairment Diet and physical activities Evidence for depression or mood disorders  The patient's weight, height, BMI, and visual acuity have been recorded in the chart.  I have made referrals, counseling, and provided education to the patient based on review of the above and I have provided the patient with a written personalized care plan for preventive services.     Starlyn Skeans, PA-C   09/22/2015

## 2015-09-23 ENCOUNTER — Ambulatory Visit (INDEPENDENT_AMBULATORY_CARE_PROVIDER_SITE_OTHER): Payer: Medicare Other | Admitting: Pharmacist

## 2015-09-23 DIAGNOSIS — I4891 Unspecified atrial fibrillation: Secondary | ICD-10-CM | POA: Diagnosis not present

## 2015-09-23 DIAGNOSIS — Z7901 Long term (current) use of anticoagulants: Secondary | ICD-10-CM

## 2015-09-23 LAB — HEMOGLOBIN A1C
HEMOGLOBIN A1C: 5.8 % — AB (ref <5.7)
MEAN PLASMA GLUCOSE: 120 mg/dL

## 2015-09-23 LAB — POCT INR: INR: 2

## 2015-09-23 NOTE — Telephone Encounter (Signed)
Rx(s) sent to pharmacy electronically.  

## 2015-09-24 ENCOUNTER — Other Ambulatory Visit: Payer: Self-pay | Admitting: Cardiovascular Disease

## 2015-09-24 MED ORDER — FUROSEMIDE 40 MG PO TABS: 20.0000 mg | ORAL_TABLET | Freq: Every day | ORAL | Status: DC | PRN

## 2015-09-24 NOTE — Telephone Encounter (Signed)
Rx(s) sent to pharmacy electronically.  

## 2015-09-29 ENCOUNTER — Other Ambulatory Visit: Payer: Self-pay | Admitting: Internal Medicine

## 2015-10-01 ENCOUNTER — Other Ambulatory Visit: Payer: Self-pay | Admitting: *Deleted

## 2015-10-01 MED ORDER — FUROSEMIDE 40 MG PO TABS: 20.0000 mg | ORAL_TABLET | Freq: Every day | ORAL | Status: DC | PRN

## 2015-10-09 ENCOUNTER — Ambulatory Visit (INDEPENDENT_AMBULATORY_CARE_PROVIDER_SITE_OTHER): Payer: Medicare Other | Admitting: *Deleted

## 2015-10-09 DIAGNOSIS — I482 Chronic atrial fibrillation, unspecified: Secondary | ICD-10-CM

## 2015-10-10 ENCOUNTER — Telehealth: Payer: Self-pay | Admitting: Cardiovascular Disease

## 2015-10-10 ENCOUNTER — Encounter: Payer: Self-pay | Admitting: Cardiology

## 2015-10-10 NOTE — Telephone Encounter (Signed)
Spoke w/ pt and she seemed very anxious about not being able to hook up home monitor. Asked pt if she could come into the office today at 3 PM and to bring her home monitor w/ her and I will help her w/ hooking it up and send transmission pt was agreeable to this plan.

## 2015-10-10 NOTE — Telephone Encounter (Signed)
New Message  Pt called states that her remote transmission isnt working. She states that she is not sure what the problem is. Please call

## 2015-10-10 NOTE — Telephone Encounter (Signed)
Message routed to device team to assist patient

## 2015-10-15 NOTE — Progress Notes (Signed)
Remote pacemaker transmission.   

## 2015-10-17 LAB — CUP PACEART REMOTE DEVICE CHECK
Brady Statistic RV Percent Paced: 82 %
Implantable Lead Location: 753859
Implantable Lead Location: 753860
Lead Channel Impedance Value: 67 Ohm
Lead Channel Setting Pacing Amplitude: 2.5 V
Lead Channel Setting Pacing Pulse Width: 0.4 ms
Lead Channel Setting Sensing Sensitivity: 5.6 mV
MDC IDC LEAD IMPLANT DT: 20110428
MDC IDC LEAD IMPLANT DT: 20110428
MDC IDC LEAD MODEL: 4592
MDC IDC MSMT BATTERY IMPEDANCE: 789 Ohm
MDC IDC MSMT BATTERY REMAINING LONGEVITY: 68 mo
MDC IDC MSMT BATTERY VOLTAGE: 2.79 V
MDC IDC MSMT LEADCHNL RV IMPEDANCE VALUE: 668 Ohm
MDC IDC SESS DTM: 20170609171806

## 2015-10-21 ENCOUNTER — Ambulatory Visit (INDEPENDENT_AMBULATORY_CARE_PROVIDER_SITE_OTHER): Payer: Medicare Other | Admitting: Pharmacist Clinician (PhC)/ Clinical Pharmacy Specialist

## 2015-10-21 DIAGNOSIS — I4891 Unspecified atrial fibrillation: Secondary | ICD-10-CM

## 2015-10-21 DIAGNOSIS — Z7901 Long term (current) use of anticoagulants: Secondary | ICD-10-CM

## 2015-10-21 LAB — POCT INR: INR: 2.6

## 2015-10-22 ENCOUNTER — Encounter: Payer: Self-pay | Admitting: Cardiology

## 2015-11-06 ENCOUNTER — Encounter: Payer: Self-pay | Admitting: Cardiology

## 2015-11-17 ENCOUNTER — Other Ambulatory Visit: Payer: Self-pay | Admitting: Cardiovascular Disease

## 2015-11-17 NOTE — Telephone Encounter (Signed)
Rx(s) sent to pharmacy electronically.  

## 2015-11-27 ENCOUNTER — Telehealth: Payer: Self-pay | Admitting: Cardiovascular Disease

## 2015-12-02 ENCOUNTER — Ambulatory Visit (INDEPENDENT_AMBULATORY_CARE_PROVIDER_SITE_OTHER): Payer: Medicare Other | Admitting: Pharmacist Clinician (PhC)/ Clinical Pharmacy Specialist

## 2015-12-02 DIAGNOSIS — I4891 Unspecified atrial fibrillation: Secondary | ICD-10-CM

## 2015-12-02 DIAGNOSIS — Z7901 Long term (current) use of anticoagulants: Secondary | ICD-10-CM

## 2015-12-02 LAB — POCT INR: INR: 2.5

## 2015-12-22 ENCOUNTER — Other Ambulatory Visit: Payer: Self-pay | Admitting: Cardiovascular Disease

## 2015-12-23 ENCOUNTER — Encounter: Payer: Self-pay | Admitting: Internal Medicine

## 2015-12-23 ENCOUNTER — Ambulatory Visit (INDEPENDENT_AMBULATORY_CARE_PROVIDER_SITE_OTHER): Payer: Medicare Other | Admitting: Internal Medicine

## 2015-12-23 VITALS — BP 140/76 | HR 71 | Temp 97.5°F | Resp 16 | Ht 64.0 in | Wt 153.2 lb

## 2015-12-23 DIAGNOSIS — R7303 Prediabetes: Secondary | ICD-10-CM

## 2015-12-23 DIAGNOSIS — Z0001 Encounter for general adult medical examination with abnormal findings: Secondary | ICD-10-CM

## 2015-12-23 DIAGNOSIS — Z0000 Encounter for general adult medical examination without abnormal findings: Secondary | ICD-10-CM | POA: Diagnosis not present

## 2015-12-23 DIAGNOSIS — Z23 Encounter for immunization: Secondary | ICD-10-CM | POA: Diagnosis not present

## 2015-12-23 DIAGNOSIS — Z136 Encounter for screening for cardiovascular disorders: Secondary | ICD-10-CM | POA: Diagnosis not present

## 2015-12-23 DIAGNOSIS — I2510 Atherosclerotic heart disease of native coronary artery without angina pectoris: Secondary | ICD-10-CM

## 2015-12-23 DIAGNOSIS — I10 Essential (primary) hypertension: Secondary | ICD-10-CM | POA: Diagnosis not present

## 2015-12-23 DIAGNOSIS — K2100 Gastro-esophageal reflux disease with esophagitis, without bleeding: Secondary | ICD-10-CM

## 2015-12-23 DIAGNOSIS — Z79899 Other long term (current) drug therapy: Secondary | ICD-10-CM

## 2015-12-23 DIAGNOSIS — Z1212 Encounter for screening for malignant neoplasm of rectum: Secondary | ICD-10-CM

## 2015-12-23 DIAGNOSIS — I4891 Unspecified atrial fibrillation: Secondary | ICD-10-CM

## 2015-12-23 DIAGNOSIS — I482 Chronic atrial fibrillation, unspecified: Secondary | ICD-10-CM

## 2015-12-23 DIAGNOSIS — E782 Mixed hyperlipidemia: Secondary | ICD-10-CM

## 2015-12-23 DIAGNOSIS — E559 Vitamin D deficiency, unspecified: Secondary | ICD-10-CM

## 2015-12-23 LAB — CBC WITH DIFFERENTIAL/PLATELET
BASOS ABS: 82 {cells}/uL (ref 0–200)
Basophils Relative: 1 %
EOS ABS: 328 {cells}/uL (ref 15–500)
Eosinophils Relative: 4 %
HEMATOCRIT: 43.1 % (ref 35.0–45.0)
Hemoglobin: 14.2 g/dL (ref 11.7–15.5)
LYMPHS PCT: 30 %
Lymphs Abs: 2460 cells/uL (ref 850–3900)
MCH: 30.1 pg (ref 27.0–33.0)
MCHC: 32.9 g/dL (ref 32.0–36.0)
MCV: 91.3 fL (ref 80.0–100.0)
MONO ABS: 902 {cells}/uL (ref 200–950)
MONOS PCT: 11 %
MPV: 10.9 fL (ref 7.5–12.5)
NEUTROS PCT: 54 %
Neutro Abs: 4428 cells/uL (ref 1500–7800)
PLATELETS: 260 10*3/uL (ref 140–400)
RBC: 4.72 MIL/uL (ref 3.80–5.10)
RDW: 13.6 % (ref 11.0–15.0)
WBC: 8.2 10*3/uL (ref 3.8–10.8)

## 2015-12-23 LAB — BASIC METABOLIC PANEL WITH GFR
BUN: 12 mg/dL (ref 7–25)
CALCIUM: 9.1 mg/dL (ref 8.6–10.4)
CO2: 27 mmol/L (ref 20–31)
CREATININE: 0.73 mg/dL (ref 0.60–0.88)
Chloride: 104 mmol/L (ref 98–110)
GFR, EST AFRICAN AMERICAN: 86 mL/min (ref >=60)
GFR, Est Non African American: 75 mL/min (ref >=60)
GLUCOSE: 86 mg/dL (ref 65–99)
POTASSIUM: 3.8 mmol/L (ref 3.5–5.3)
Sodium: 142 mmol/L (ref 135–146)

## 2015-12-23 LAB — LIPID PANEL
Cholesterol: 141 mg/dL (ref 125–200)
HDL: 40 mg/dL — ABNORMAL LOW (ref >=46)
LDL CALC: 80 mg/dL (ref <130)
Total CHOL/HDL Ratio: 3.5 Ratio (ref <=5.0)
Triglycerides: 106 mg/dL (ref <150)
VLDL: 21 mg/dL (ref <30)

## 2015-12-23 LAB — HEPATIC FUNCTION PANEL
ALBUMIN: 4 g/dL (ref 3.6–5.1)
ALK PHOS: 115 U/L (ref 33–130)
ALT: 18 U/L (ref 6–29)
AST: 19 U/L (ref 10–35)
BILIRUBIN TOTAL: 0.7 mg/dL (ref 0.2–1.2)
Bilirubin, Direct: 0.2 mg/dL (ref <=0.2)
Indirect Bilirubin: 0.5 mg/dL (ref 0.2–1.2)
TOTAL PROTEIN: 6.9 g/dL (ref 6.1–8.1)

## 2015-12-23 LAB — MAGNESIUM: MAGNESIUM: 1.7 mg/dL (ref 1.5–2.5)

## 2015-12-23 NOTE — Progress Notes (Signed)
[facility]                   [name], M.D.    Uvaldo Bristle. Silverio Lay, P.A.-C      Starlyn Skeans, P.A.-C   [facility]                [address] [place] [id number] Telephone [phone] Telefax [phone]   Annual Screening/Preventative Visit And Comprehensive Evaluation &  Examination     This very nice 82y.o.[name] presents for a Wellness/Preventative Visit & comprehensive evaluation and management of multiple medical co-morbidities.  Patient has been followed for HTN, Prediabetes, Hyperlipidemia and Vitamin D Deficiency. Patient has been widowed a little over a year after a near 48 yr marriage and feels that she is recovering from her grief reaction.  She also has hx/o GERD  And feels sx's are controlled with prudent diet.       HTN predates circa 1998. Patient's BP has been controlled at home and patient denies any cardiac symptoms as chest pain, palpitations, shortness of breath, dizziness or ankle swelling. Today's BP: 140/76 In 1993 & 2007, she had Negative & Normal Heart Caths. Cardiolite in 2003 was Normal.  In 2006, she had pAfib and consequent TIA with Rt facial Paresthesia resolving w/o sequelae. In 2007, she had a PPM for SSS and is followed at [facility].     Patient's hyperlipidemia is controlled with diet and medications. Patient denies myalgias or other medication SE's. Last lipids were at goal: Lab Results  Component Value Date   CHOL 143 09/22/2015   HDL 40 (L) 09/22/2015   LDLCALC 82 09/22/2015   TRIG 106 09/22/2015   CHOLHDL 3.6 09/22/2015      Patient has prediabetes circa 2011 w/A1c 5.9% and elev Insulin 86 suspect for Insulin Resistance.  Patient denies reactive hypoglycemic symptoms, visual blurring, diabetic polys, or paresthesias. Last A1c was not at goal: Lab Results  Component Value Date   HGBA1C 5.8 (H) 09/22/2015      Finally, patient has  history of severe Vitamin D Deficiency of 13 (2008)and last Vitamin D was still low: Lab Results  Component Value Date   VD25OH 28 06/25/2015   Current Outpatient Prescriptions on File Prior to Visit  Medication Sig  . ALPRAZolam (XANAX) 1 MG tablet take 1/2 to 1 tablet by mouth three times a day if needed for anxiety  . cholecalciferol (VITAMIN D) 1000 UNITS tablet Take 1,000 Units by mouth 3 (three) times daily.   . fluticasone (FLONASE) 50 MCG/ACT nasal spray Place 2 sprays into both nostrils daily.  . furosemide (LASIX) 40 MG tablet Take 0.5 tablets (20 mg total) by mouth daily as needed.  . gabapentin (NEURONTIN) 300 MG capsule take 1 capsule by mouth twice a day  . potassium chloride SA (K-DUR,KLOR-CON) 20 MEQ tablet Take 1 tablet (20 mEq total) by mouth daily.  Marland Kitchen TAZTIA XT 240 MG 24 hr capsule take 1 capsule by mouth once daily  . warfarin (COUMADIN) 3 MG tablet take 1 to 1 AND A 1/2 tablets by mouth as directed   No current facility-administered medications on file prior to visit.    Allergies  Allergen Reactions  . Ace Inhibitors     Unknown   . Acrylic Polymer [Carbomer]   . Augmentin [Amoxicillin-Pot Clavulanate]   . Chocolate Other (See  Comments)    migraine's   . Ciprofloxacin   . Gabapentin Other (See Comments)    Unsteady gait   . Levaquin [Levofloxacin In D5w]   . Zocor [Simvastatin]    Past Medical History:  Diagnosis Date  . Atrial fib/flutter, transient   . Atrial fibrillation, chronic ([place])   . CHF (congestive heart failure) ([place]) 10/29/2009   Echo - EF >55%; normal LV size and systolic function; unable to assess diastolic fcn due to E/A fusion, pulmonary vein flow pattern suggests elevated filling pressure; marked biatrail dilation, mild/mod tricuspid regurgitation; mod pulmonary htn; mild/mod mitral regurgitation; although echocardiographic features are incomplete findings suggest possible infiltrative cardiomyopathy (maybe amyloidosi  . Coronary artery  disease 03/19/2002   R/P Cardiolite - EF 76%; nromal static and dynamic myocardial perfusion images; normal wall motion and endocardial thickening in all vascular territories  . Facial numbness 12/26/2008   carotid doppler - R and L ICAs 0-49% diameter reduction (velocities suggest low end of scale)  . Hypertension   . Pacemaker   . Peripheral neuropathy ([place])   . Skin cancer    s/p surgical removal.  . TIA (transient ischemic attack)    Health Maintenance  Topic Date Due  . INFLUENZA VACCINE  12/02/2015  . DEXA SCAN  06/03/2017 (Originally 08/21/1994)  . ZOSTAVAX  06/03/2017 (Originally 08/20/1989)  . TETANUS/TDAP  03/18/2024  . PNA vac Low Risk Adult  Completed   Immunization History  Administered Date(s) Administered  . DT 03/19/2015  . Influenza Split 05/04/2011  . Influenza, High Dose Seasonal PF 03/19/2014, 12/23/2015  . Influenza,inj,quad, With Preservative 05/21/2013  . Influenza-Unspecified 02/04/2015  . Pneumococcal Conjugate-13 03/19/2014  . Pneumococcal Polysaccharide-23 05/04/2011  . Pneumococcal-Unspecified 05/03/2001  . Td 05/04/2003   Past Surgical History:  Procedure Laterality Date  . ABDOMINAL HYSTERECTOMY    . APPENDECTOMY    . CARDIAC CATHETERIZATION  08/06/2005   minimal coronary disease predominant RCA; no significant atherosclerosis; new onset sick sinus syndrome and atrial flutter w/ ventricular response, controlled on med therapy; systemic HTN, normal renal arteries  . CARDIOVERSION  11/19/2009   successful DCCV from AF to sinus type rhythm  . CHOLECYSTECTOMY    . Skin cancer resection     Family History  Problem Relation Age of Onset  . Heart disease Mother   . Diabetes Mother   . Heart attack Father   . Heart disease Father   . Cirrhosis Brother    Social History  Substance Use Topics  . Smoking status: Never Smoker  . Smokeless tobacco: Never Used  . Alcohol use No    ROS Constitutional: Denies fever, chills, weight loss/gain, headaches,  insomnia,  night sweats, and change in appetite. Does c/o fatigue. Eyes: Denies redness, blurred vision, diplopia, discharge, itchy, watery eyes.  ENT: Denies discharge, congestion, post nasal drip, epistaxis, sore throat, earache, hearing loss, dental pain, Tinnitus, Vertigo, Sinus pain, snoring.  Cardio: Denies chest pain, palpitations, irregular heartbeat, syncope, dyspnea, diaphoresis, orthopnea, PND, claudication, edema Respiratory: denies cough, dyspnea, DOE, pleurisy, hoarseness, laryngitis, wheezing.  Gastrointestinal: Denies dysphagia, heartburn, reflux, water brash, pain, cramps, nausea, vomiting, bloating, diarrhea, constipation, hematemesis, melena, hematochezia, jaundice, hemorrhoids Genitourinary: Denies dysuria, frequency, urgency, nocturia, hesitancy, discharge, hematuria, flank pain Breast: Breast lumps, nipple discharge, bleeding.  Musculoskeletal: Denies arthralgia, myalgia, stiffness, Jt. Swelling, pain, limp, and strain/sprain. Denies falls. Skin: Denies puritis, rash, hives, warts, acne, eczema, changing in skin lesion Neuro: No weakness, tremor, incoordination, spasms, paresthesia, pain Psychiatric: Denies confusion, memory loss, sensory loss.  Denies Depression. Endocrine: Denies change in weight, skin, hair change, nocturia, and paresthesia, diabetic polys, visual blurring, hyper / hypo glycemic episodes.  Heme/Lymph: No excessive bleeding, bruising, enlarged lymph nodes.  Physical Exam  BP 140/76   Pulse 71   Temp 97.5 F (36.4 C)   Resp 16   Ht 5\' 4"  (1.626 m)   Wt 153 lb 3.2 oz (69.5 kg)   SpO2 99%   BMI 26.30 kg/m   General Appearance: Well nourished and in no apparent distress.  Eyes: PERRLA, EOMs, conjunctiva no swelling or erythema, normal fundi and vessels. Sinuses: No frontal/maxillary tenderness ENT/Mouth: EACs patent / TMs  nl. Nares clear without erythema, swelling, mucoid exudates. Oral hygiene is good. No erythema, swelling, or exudate. Tongue  normal, non-obstructing. Tonsils not swollen or erythematous. Hearing normal.  Neck: Supple, thyroid normal. No bruits, nodes or JVD. Respiratory: Respiratory effort normal.  BS equal and clear bilateral without rales, rhonci, wheezing or stridor. Cardio: Heart sounds are normal with regular rate and rhythm and no murmurs, rubs or gallops. Peripheral pulses are normal and equal bilaterally without edema. No aortic or femoral bruits. Chest: symmetric with normal excursions and percussion. Breasts: Symmetric, without lumps, nipple discharge, retractions, or fibrocystic changes.  Abdomen: Flat, soft with bowel sounds active. Nontender, no guarding, rebound, hernias, masses, or organomegaly.  Lymphatics: Non tender without lymphadenopathy.  Genitourinary:  Musculoskeletal: Full ROM all peripheral extremities, joint stability, 5/5 strength, and normal gait. Skin: Warm and dry without rashes, lesions, cyanosis, clubbing or  ecchymosis.  Neuro: Cranial nerves intact, reflexes equal bilaterally. Normal muscle tone, no cerebellar symptoms. Sensation intact.  Pysch: Alert and oriented X 3, normal affect, Insight and Judgment appropriate.   Assessment and Plan  1. Annual Preventative Screening Examination  - Microalbumin / creatinine urine ratio - EKG 12-Lead - POC Hemoccult Bld/Stl  - CBC with Differential/Platelet - BASIC METABOLIC PANEL WITH GFR - Hepatic function panel - Magnesium - Lipid panel - TSH - Hemoglobin A1c - Insulin, random - VITAMIN D 25 Hydroxy  2. Essential hypertension  - Microalbumin / creatinine urine ratio - EKG 12-Lead - TSH  3. Hyperlipidemia  - EKG 12-Lead - Lipid panel - TSH  4. Prediabetes  - Hemoglobin A1c - Insulin, random  5. Vitamin D deficiency  - VITAMIN D 25 Hydroxy   6. Atrial fibrillation, unspecified type ([place])   7. Gastroesophageal reflux disease   8. Atherosclerosis of native coronary artery of native heart without angina  pectoris   9. Screening for rectal cancer  - POC Hemoccult Bld/Stl  10. Screening for ischemic heart disease  - EKG 12-Lead  11. Need for vaccination for H flu type B  - Flu vaccine HIGH DOSE PF (Fluzone High dose)  12. Medication management  - Urinalysis, Routine w reflex microscopic  - CBC with Differential/Platelet - BASIC METABOLIC PANEL WITH GFR - Hepatic function panel - Magnesium  13. Chronic atrial fibrillation ([facility])   Continue prudent diet as discussed, weight control, BP monitoring, regular exercise, and medications. Discussed med's effects and SE's. Screening labs and tests as requested with regular follow-up as recommended. Over 40 minutes of exam, counseling, chart review and high complex critical decision making was performed.

## 2015-12-23 NOTE — Patient Instructions (Signed)

## 2015-12-24 ENCOUNTER — Other Ambulatory Visit: Payer: Self-pay | Admitting: Internal Medicine

## 2015-12-24 LAB — URINALYSIS, ROUTINE W REFLEX MICROSCOPIC
BILIRUBIN URINE: NEGATIVE
Glucose, UA: NEGATIVE
HGB URINE DIPSTICK: NEGATIVE
NITRITE: NEGATIVE
PH: 6 (ref 5.0–8.0)
Protein, ur: NEGATIVE
SPECIFIC GRAVITY, URINE: 1.024 (ref 1.001–1.035)

## 2015-12-24 LAB — MICROALBUMIN / CREATININE URINE RATIO
CREATININE, URINE: 239 mg/dL (ref 20–320)
MICROALB UR: 2.8 mg/dL
Microalb Creat Ratio: 12 mcg/mg creat (ref <30)

## 2015-12-24 LAB — URINALYSIS, MICROSCOPIC ONLY
Casts: NONE SEEN [LPF]
Yeast: NONE SEEN [HPF]

## 2015-12-24 LAB — VITAMIN D 25 HYDROXY (VIT D DEFICIENCY, FRACTURES): VIT D 25 HYDROXY: 61 ng/mL (ref 30–100)

## 2015-12-24 LAB — INSULIN, RANDOM: INSULIN: 29 u[IU]/mL — AB (ref 2.0–19.6)

## 2015-12-24 LAB — HEMOGLOBIN A1C
HEMOGLOBIN A1C: 5.5 % (ref <5.7)
MEAN PLASMA GLUCOSE: 111 mg/dL

## 2015-12-24 LAB — TSH: TSH: 1.14 m[IU]/L

## 2015-12-25 ENCOUNTER — Telehealth: Payer: Self-pay | Admitting: *Deleted

## 2015-12-25 NOTE — Telephone Encounter (Signed)
Pt aware of lab results and scheduled an appointment for the lab to add a urine culture.

## 2015-12-26 ENCOUNTER — Other Ambulatory Visit: Payer: Medicare Other

## 2015-12-26 ENCOUNTER — Other Ambulatory Visit: Payer: Self-pay | Admitting: Internal Medicine

## 2015-12-26 DIAGNOSIS — N390 Urinary tract infection, site not specified: Secondary | ICD-10-CM

## 2015-12-27 LAB — URINALYSIS, MICROSCOPIC ONLY
Bacteria, UA: NONE SEEN [HPF]
Casts: NONE SEEN [LPF]
Yeast: NONE SEEN [HPF]

## 2015-12-27 LAB — URINALYSIS, ROUTINE W REFLEX MICROSCOPIC
Bilirubin Urine: NEGATIVE
Glucose, UA: NEGATIVE
Hgb urine dipstick: NEGATIVE
KETONES UR: NEGATIVE
NITRITE: NEGATIVE
Protein, ur: NEGATIVE
SPECIFIC GRAVITY, URINE: 1.016 (ref 1.001–1.035)
pH: 6 (ref 5.0–8.0)

## 2015-12-28 LAB — URINE CULTURE: Organism ID, Bacteria: NO GROWTH

## 2015-12-31 ENCOUNTER — Telehealth: Payer: Self-pay | Admitting: *Deleted

## 2015-12-31 NOTE — Telephone Encounter (Signed)
Patient aware of negative urine culture

## 2016-01-07 ENCOUNTER — Other Ambulatory Visit: Payer: Self-pay | Admitting: *Deleted

## 2016-01-07 DIAGNOSIS — Z1212 Encounter for screening for malignant neoplasm of rectum: Secondary | ICD-10-CM

## 2016-01-07 DIAGNOSIS — Z0001 Encounter for general adult medical examination with abnormal findings: Secondary | ICD-10-CM

## 2016-01-07 LAB — POC HEMOCCULT BLD/STL (HOME/3-CARD/SCREEN)
Card #3 Fecal Occult Blood, POC: NEGATIVE
FECAL OCCULT BLD: NEGATIVE
Fecal Occult Blood, POC: NEGATIVE

## 2016-01-12 ENCOUNTER — Ambulatory Visit (INDEPENDENT_AMBULATORY_CARE_PROVIDER_SITE_OTHER): Payer: Medicare Other | Admitting: Pharmacist

## 2016-01-12 DIAGNOSIS — Z7901 Long term (current) use of anticoagulants: Secondary | ICD-10-CM

## 2016-01-12 DIAGNOSIS — I4891 Unspecified atrial fibrillation: Secondary | ICD-10-CM | POA: Diagnosis not present

## 2016-01-12 LAB — POCT INR: INR: 2

## 2016-01-14 ENCOUNTER — Ambulatory Visit (INDEPENDENT_AMBULATORY_CARE_PROVIDER_SITE_OTHER): Payer: Medicare Other | Admitting: *Deleted

## 2016-01-14 DIAGNOSIS — I482 Chronic atrial fibrillation, unspecified: Secondary | ICD-10-CM

## 2016-01-14 DIAGNOSIS — Z950 Presence of cardiac pacemaker: Secondary | ICD-10-CM

## 2016-01-14 NOTE — Progress Notes (Signed)
Remote pacemaker transmission.   

## 2016-01-15 ENCOUNTER — Encounter: Payer: Self-pay | Admitting: Cardiology

## 2016-01-19 ENCOUNTER — Other Ambulatory Visit: Payer: Self-pay | Admitting: Physician Assistant

## 2016-01-22 LAB — CUP PACEART REMOTE DEVICE CHECK
Battery Remaining Longevity: 63 mo
Battery Voltage: 2.78 V
Brady Statistic RV Percent Paced: 81 %
Implantable Lead Implant Date: 20110428
Implantable Lead Implant Date: 20110428
Implantable Lead Location: 753859
Implantable Lead Location: 753860
Implantable Lead Model: 4092
Implantable Lead Model: 4592
Lead Channel Impedance Value: 67 Ohm
Lead Channel Impedance Value: 678 Ohm
Lead Channel Pacing Threshold Amplitude: 0.625 V
Lead Channel Pacing Threshold Pulse Width: 0.4 ms
Lead Channel Setting Pacing Pulse Width: 0.4 ms
MDC IDC MSMT BATTERY IMPEDANCE: 893 Ohm
MDC IDC SESS DTM: 20170913122706
MDC IDC SET LEADCHNL RV PACING AMPLITUDE: 2.5 V
MDC IDC SET LEADCHNL RV SENSING SENSITIVITY: 5.6 mV

## 2016-02-23 ENCOUNTER — Ambulatory Visit (INDEPENDENT_AMBULATORY_CARE_PROVIDER_SITE_OTHER): Payer: Medicare Other | Admitting: Pharmacist

## 2016-02-23 DIAGNOSIS — Z7901 Long term (current) use of anticoagulants: Secondary | ICD-10-CM | POA: Diagnosis not present

## 2016-02-23 DIAGNOSIS — I4891 Unspecified atrial fibrillation: Secondary | ICD-10-CM

## 2016-02-23 LAB — POCT INR: INR: 3.1

## 2016-03-22 ENCOUNTER — Ambulatory Visit (INDEPENDENT_AMBULATORY_CARE_PROVIDER_SITE_OTHER): Payer: Medicare Other | Admitting: Pharmacist

## 2016-03-22 DIAGNOSIS — I4891 Unspecified atrial fibrillation: Secondary | ICD-10-CM | POA: Diagnosis not present

## 2016-03-22 DIAGNOSIS — Z7901 Long term (current) use of anticoagulants: Secondary | ICD-10-CM

## 2016-03-22 LAB — POCT INR: INR: 3.2

## 2016-03-30 ENCOUNTER — Ambulatory Visit (INDEPENDENT_AMBULATORY_CARE_PROVIDER_SITE_OTHER): Payer: Medicare Other | Admitting: Internal Medicine

## 2016-03-30 ENCOUNTER — Encounter: Payer: Self-pay | Admitting: Internal Medicine

## 2016-03-30 VITALS — BP 132/80 | HR 74 | Resp 16 | Ht 64.0 in | Wt 147.2 lb

## 2016-03-30 DIAGNOSIS — I482 Chronic atrial fibrillation, unspecified: Secondary | ICD-10-CM

## 2016-03-30 DIAGNOSIS — Z7901 Long term (current) use of anticoagulants: Secondary | ICD-10-CM

## 2016-03-30 DIAGNOSIS — I10 Essential (primary) hypertension: Secondary | ICD-10-CM

## 2016-03-30 DIAGNOSIS — E782 Mixed hyperlipidemia: Secondary | ICD-10-CM

## 2016-03-30 DIAGNOSIS — E559 Vitamin D deficiency, unspecified: Secondary | ICD-10-CM | POA: Diagnosis not present

## 2016-03-30 DIAGNOSIS — Z79899 Other long term (current) drug therapy: Secondary | ICD-10-CM | POA: Diagnosis not present

## 2016-03-30 DIAGNOSIS — R7303 Prediabetes: Secondary | ICD-10-CM | POA: Diagnosis not present

## 2016-03-30 DIAGNOSIS — Z950 Presence of cardiac pacemaker: Secondary | ICD-10-CM

## 2016-03-30 MED ORDER — AZELASTINE HCL 0.1 % NA SOLN: 1.0000 | Freq: Two times a day (BID) | NASAL | 2 refills | Status: DC

## 2016-03-30 MED ORDER — ALPRAZOLAM 1 MG PO TABS: ORAL_TABLET | ORAL | 5 refills | Status: DC

## 2016-03-30 NOTE — Progress Notes (Deleted)
Assessment and Plan:   Hypertension -Continue medication, monitor blood pressure at home. Continue DASH diet.  Reminder to go to the ER if any CP, SOB, nausea, dizziness, severe HA, changes vision/speech, left arm numbness and tingling and jaw pain.  Cholesterol -Continue diet and exercise. Check cholesterol.    Prediabetes  -Continue diet and exercise. Check A1C  Vitamin D Def - check level and continue medications.   Continue diet and meds as discussed. Further disposition pending results of labs. Over 30 minutes of exam, counseling, chart review, and critical decision making was performed  Future Appointments Date Time Provider [place]  03/30/2016 11:00 AM Vicie Mutters, PA-C [facility] None  04/14/2016 8:30 AM [facility] DEVICE REMOTES [facility]  04/19/2016 8:45 AM [facility]  06/30/2016 11:00 AM [name], MD [facility] None  01/07/2017 10:00 AM [name], MD [facility] None     HPI 82 y.o. female  presents for 3 month follow up on hypertension, cholesterol, prediabetes, and vitamin D deficiency.   Her blood pressure {HAS HAS NOT:18834} been controlled at home, today their BP is    She {DOES_DOES NF:[id number] workout. She denies chest pain, shortness of breath, dizziness.  She {ACTION; IS/IS [id number] on cholesterol medication and denies myalgias. Her cholesterol {ACTION; IS/IS NOT:[id number]} at goal. The cholesterol last visit was:   Lab Results  Component Value Date   CHOL 141 12/23/2015   HDL 40 (L) 12/23/2015   LDLCALC 80 12/23/2015   TRIG 106 12/23/2015   CHOLHDL 3.5 12/23/2015    She {Has/has not:18111} been working on diet and exercise for prediabetes, and denies {Symptoms; diabetes w/o none:19199}. Last A1C in the office was:  Lab Results  Component Value Date   HGBA1C 5.5 12/23/2015   Patient is on Vitamin D supplement.   Lab Results  Component Value Date   VD25OH 61 12/23/2015        Current Medications:  Current Outpatient Prescriptions on File Prior to Visit  Medication Sig Dispense Refill  . ALPRAZolam (XANAX) 1 MG tablet take 1/2 to 1 tablet by mouth three times a day if needed for anxiety 90 tablet 5  . cholecalciferol (VITAMIN D) 1000 UNITS tablet Take 1,000 Units by mouth 3 (three) times daily.     . fluticasone (FLONASE) 50 MCG/ACT nasal spray Place 2 sprays into both nostrils daily. 16 g 0  . furosemide (LASIX) 40 MG tablet Take 0.5 tablets (20 mg total) by mouth daily as needed. 30 tablet 10  . gabapentin (NEURONTIN) 300 MG capsule take 1 capsule by mouth twice a day 60 capsule 5  . potassium chloride SA (K-DUR,KLOR-CON) 20 MEQ tablet Take 1 tablet (20 mEq total) by mouth daily. 30 tablet 8  . TAZTIA XT 240 MG 24 hr capsule take 1 capsule by mouth once daily 30 capsule 10  . warfarin (COUMADIN) 3 MG tablet take 1 to 1 AND A 1/2 tablets by mouth as directed 45 tablet 3   No current facility-administered medications on file prior to visit.    Medical History:  Past Medical History:  Diagnosis Date  . Atrial fib/flutter, transient   . Atrial fibrillation, chronic ([place])   . CHF (congestive heart failure) ([place]) 10/29/2009   Echo - EF >55%; normal LV size and systolic function; unable to assess diastolic fcn due to E/A fusion, pulmonary vein flow pattern suggests elevated filling pressure; marked biatrail dilation, mild/mod tricuspid regurgitation; mod pulmonary htn; mild/mod mitral regurgitation; although echocardiographic features are incomplete findings suggest  possible infiltrative cardiomyopathy (maybe amyloidosi  . Coronary artery disease 03/19/2002   R/P Cardiolite - EF 76%; nromal static and dynamic myocardial perfusion images; normal wall motion and endocardial thickening in all vascular territories  . Facial numbness 12/26/2008   carotid doppler - R and L ICAs 0-49% diameter reduction (velocities suggest low end of scale)  . Hypertension   . Pacemaker    . Peripheral neuropathy ([place])   . Skin cancer    s/p surgical removal.  . TIA (transient ischemic attack)    Allergies:  Allergies  Allergen Reactions  . Ace Inhibitors     Unknown   . Acrylic Polymer [Carbomer]   . Augmentin [Amoxicillin-Pot Clavulanate]   . Chocolate Other (See Comments)    migraine's   . Ciprofloxacin   . Gabapentin Other (See Comments)    Unsteady gait   . Levaquin [Levofloxacin In D5w]   . Zocor [Simvastatin]      Review of Systems:  ROS  Family history- Review and unchanged Social history- Review and unchanged Physical Exam: There were no vitals taken for this visit. Wt Readings from Last 3 Encounters:  12/23/15 153 lb 3.2 oz (69.5 kg)  09/22/15 155 lb (70.3 kg)  07/08/15 157 lb 7 oz (71.4 kg)   General Appearance: Well nourished, in no apparent distress. Eyes: PERRLA, EOMs, conjunctiva no swelling or erythema Sinuses: No Frontal/maxillary tenderness ENT/Mouth: Ext aud canals clear, TMs without erythema, bulging. No erythema, swelling, or exudate on post pharynx.  Tonsils not swollen or erythematous. Hearing normal.  Neck: Supple, thyroid normal.  Respiratory: Respiratory effort normal, BS equal bilaterally without rales, rhonchi, wheezing or stridor.  Cardio: RRR with no MRGs. Brisk peripheral pulses without edema.  Abdomen: Soft, + BS,  Non tender, no guarding, rebound, hernias, masses. Lymphatics: Non tender without lymphadenopathy.  Musculoskeletal: Full ROM, 5/5 strength, {PSY - GAIT AND STATION:22860} gait Skin: Warm, dry without rashes, lesions, ecchymosis.  Neuro: Cranial nerves intact. Normal muscle tone, no cerebellar symptoms. Psych: Awake and oriented X 3, normal affect, Insight and Judgment appropriate.    Vicie Mutters, PA-C 10:23 AM [facility]

## 2016-03-30 NOTE — Progress Notes (Signed)
[name], female   DOB: March 24, 1928, 82 y.o.   MRN: [id number]   Assessment and Plan:  Hypertension:  -well controlled -is following with cardiology -is due to have pacemaker interrogated soon -Continue medication,  -monitor blood pressure at home.  -Continue DASH diet.   -Reminder to go to the ER if any CP, SOB, nausea, dizziness, severe HA, changes vision/speech, left arm numbness and tingling, and jaw pain.  Chronic afib -cont rate control -cont couamdin -followed by cards for couamdin check  Chronic anticoagulant use -recently checked on 11/20 which was in normal range -cont coumadin -due for check in 4-6 weeks  Pacemaker -due for interrogation in January  Cholesterol: -Continue diet and exercise.   Pre-diabetes: -Continue diet and exercise.   Vitamin D Def: -continue medications.   Continue diet and meds as discussed. Further disposition pending results of labs.  HPI 82 y.o. female  presents for 3 month follow up with hypertension, hyperlipidemia, prediabetes and vitamin D.   Her blood pressure has been controlled at home, today their BP is BP: 132/80.   She does not workout. She denies chest pain, shortness of breath, dizziness.   She is on cholesterol medication and denies myalgias. Her cholesterol is at goal. The cholesterol last visit was:   Lab Results  Component Value Date   CHOL 141 12/23/2015   HDL 40 (L) 12/23/2015   LDLCALC 80 12/23/2015   TRIG 106 12/23/2015   CHOLHDL 3.5 12/23/2015     She has been working on diet and exercise for prediabetes, and denies foot ulcerations, hyperglycemia, hypoglycemia , increased appetite, nausea, paresthesia of the feet, polydipsia, polyuria, visual disturbances, vomiting and weight loss. Last A1C in the office was:  Lab Results  Component Value Date   HGBA1C 5.5 12/23/2015    Patient is on Vitamin D supplement.  Lab Results  Component Value Date   VD25OH 18 12/23/2015     She reports that  recently she has been having some issues with her right eye.  She reports that she is getting redness and difficulty opening the eyes.  She reports that she did go to see Dr. Katy Fitch and was given both oral and eye drop antibiotics.  He has seen her to confirm clearance and it is getting better.  She does still having some congestion of her nose.  She is having a lot of drainage.  She is getting post nasal drainage as well.    She is having her coumadin checked at the cardiology.  She reports that she is due to have her pacemaker interrogated to make sure that it is working correctly.  She has had no palpitations.  She reports that she is otherwise doing well.     Current Medications:  Current Outpatient Prescriptions on File Prior to Visit  Medication Sig Dispense Refill  . ALPRAZolam (XANAX) 1 MG tablet take 1/2 to 1 tablet by mouth three times a day if needed for anxiety 90 tablet 5  . cholecalciferol (VITAMIN D) 1000 UNITS tablet Take 1,000 Units by mouth 3 (three) times daily.     . fluticasone (FLONASE) 50 MCG/ACT nasal spray Place 2 sprays into both nostrils daily. 16 g 0  . furosemide (LASIX) 40 MG tablet Take 0.5 tablets (20 mg total) by mouth daily as needed. 30 tablet 10  . gabapentin (NEURONTIN) 300 MG capsule take 1 capsule by mouth twice a day 60 capsule 5  . potassium chloride SA (K-DUR,KLOR-CON) 20 MEQ tablet Take  1 tablet (20 mEq total) by mouth daily. 30 tablet 8  . TAZTIA XT 240 MG 24 hr capsule take 1 capsule by mouth once daily 30 capsule 10  . warfarin (COUMADIN) 3 MG tablet take 1 to 1 AND A 1/2 tablets by mouth as directed 45 tablet 3   No current facility-administered medications on file prior to visit.     Medical History:  Past Medical History:  Diagnosis Date  . Atrial fib/flutter, transient   . Atrial fibrillation, chronic ([place])   . CHF (congestive heart failure) ([place]) 10/29/2009   Echo - EF >55%; normal LV size and systolic function; unable to assess diastolic fcn  due to E/A fusion, pulmonary vein flow pattern suggests elevated filling pressure; marked biatrail dilation, mild/mod tricuspid regurgitation; mod pulmonary htn; mild/mod mitral regurgitation; although echocardiographic features are incomplete findings suggest possible infiltrative cardiomyopathy (maybe amyloidosi  . Coronary artery disease 03/19/2002   R/P Cardiolite - EF 76%; nromal static and dynamic myocardial perfusion images; normal wall motion and endocardial thickening in all vascular territories  . Facial numbness 12/26/2008   carotid doppler - R and L ICAs 0-49% diameter reduction (velocities suggest low end of scale)  . Hypertension   . Pacemaker   . Peripheral neuropathy ([place])   . Skin cancer    s/p surgical removal.  . TIA (transient ischemic attack)     Allergies:  Allergies  Allergen Reactions  . Ace Inhibitors     Unknown   . Acrylic Polymer [Carbomer]   . Augmentin [Amoxicillin-Pot Clavulanate]   . Chocolate Other (See Comments)    migraine's   . Ciprofloxacin   . Gabapentin Other (See Comments)    Unsteady gait   . Levaquin [Levofloxacin In D5w]   . Zocor [Simvastatin]      Review of Systems:  Review of Systems  Constitutional: Negative for chills, fever and malaise/fatigue.  HENT: Positive for congestion and sore throat. Negative for ear pain.   Eyes: Negative.   Respiratory: Negative for cough, shortness of breath and wheezing.   Cardiovascular: Negative for chest pain, palpitations and leg swelling.  Gastrointestinal: Negative for abdominal pain, blood in stool, constipation, diarrhea, heartburn and melena.  Genitourinary: Negative.   Skin: Negative.   Neurological: Negative for dizziness, sensory change, loss of consciousness and headaches.  Psychiatric/Behavioral: Negative for depression. The patient is not nervous/anxious and does not have insomnia.     Family history- Review and unchanged  Social history- Review and unchanged  Physical Exam: BP  132/80   Pulse 74   Resp 16   Ht 5\' 4"  (1.626 m)   Wt 147 lb 3.2 oz (66.8 kg)   SpO2 97%   BMI 25.27 kg/m  Wt Readings from Last 3 Encounters:  03/30/16 147 lb 3.2 oz (66.8 kg)  12/23/15 153 lb 3.2 oz (69.5 kg)  09/22/15 155 lb (70.3 kg)    General Appearance: Well nourished well developed, in no apparent distress. Eyes: PERRLA, EOMs, conjunctiva no swelling or erythema ENT/Mouth: Ear canals normal without obstruction, swelling, erythma, discharge.  TMs normal bilaterally.  Oropharynx moist, clear, without exudate, or postoropharyngeal swelling. Neck: Supple, thyroid normal,no cervical adenopathy  Respiratory: Respiratory effort normal, Breath sounds clear A&P without rhonchi, wheeze, or rale.  No retractions, no accessory usage. Cardio: RRR with no MRGs. Brisk peripheral pulses without edema.  Abdomen: Soft, + BS,  Non tender, no guarding, rebound, hernias, masses. Musculoskeletal: Full ROM, 5/5 strength, Normal gait Skin: Warm, dry without rashes, lesions, ecchymosis.  Neuro: Awake and oriented X 3, Cranial nerves intact. Normal muscle tone, no cerebellar symptoms. Psych: Normal affect, Insight and Judgment appropriate.    Starlyn Skeans, PA-C 11:22 AM [facility]

## 2016-04-14 ENCOUNTER — Telehealth: Payer: Self-pay | Admitting: Cardiology

## 2016-04-14 ENCOUNTER — Ambulatory Visit (INDEPENDENT_AMBULATORY_CARE_PROVIDER_SITE_OTHER): Payer: Medicare Other | Admitting: *Deleted

## 2016-04-14 DIAGNOSIS — I482 Chronic atrial fibrillation, unspecified: Secondary | ICD-10-CM

## 2016-04-14 NOTE — Telephone Encounter (Signed)
Spoke with pt and reminded pt of remote transmission that is due today. Pt verbalized understanding.   

## 2016-04-14 NOTE — Progress Notes (Signed)
Remote pacemaker transmission.   

## 2016-04-19 ENCOUNTER — Other Ambulatory Visit: Payer: Self-pay | Admitting: Cardiovascular Disease

## 2016-04-19 ENCOUNTER — Ambulatory Visit (INDEPENDENT_AMBULATORY_CARE_PROVIDER_SITE_OTHER): Payer: Medicare Other | Admitting: Pharmacist Clinician (PhC)/ Clinical Pharmacy Specialist

## 2016-04-19 DIAGNOSIS — Z7901 Long term (current) use of anticoagulants: Secondary | ICD-10-CM

## 2016-04-19 DIAGNOSIS — I4891 Unspecified atrial fibrillation: Secondary | ICD-10-CM

## 2016-04-19 LAB — POCT INR: INR: 2.3

## 2016-04-21 ENCOUNTER — Encounter: Payer: Self-pay | Admitting: Cardiology

## 2016-04-30 LAB — CUP PACEART REMOTE DEVICE CHECK
Battery Remaining Longevity: 62 mo
Brady Statistic RV Percent Paced: 80 %
Implantable Lead Implant Date: 20110428
Implantable Lead Location: 753860
Implantable Lead Model: 4092
Implantable Lead Model: 4592
Implantable Pulse Generator Implant Date: 20110428
Lead Channel Impedance Value: 674 Ohm
Lead Channel Pacing Threshold Pulse Width: 0.4 ms
Lead Channel Setting Pacing Pulse Width: 0.4 ms
Lead Channel Setting Sensing Sensitivity: 4 mV
MDC IDC LEAD IMPLANT DT: 20110428
MDC IDC LEAD LOCATION: 753859
MDC IDC MSMT BATTERY IMPEDANCE: 945 Ohm
MDC IDC MSMT BATTERY VOLTAGE: 2.78 V
MDC IDC MSMT LEADCHNL RA IMPEDANCE VALUE: 67 Ohm
MDC IDC MSMT LEADCHNL RV PACING THRESHOLD AMPLITUDE: 0.625 V
MDC IDC SESS DTM: 20171213140740
MDC IDC SET LEADCHNL RV PACING AMPLITUDE: 2.5 V

## 2016-05-17 ENCOUNTER — Ambulatory Visit (INDEPENDENT_AMBULATORY_CARE_PROVIDER_SITE_OTHER): Payer: Medicare Other | Admitting: Pharmacist

## 2016-05-17 DIAGNOSIS — Z7901 Long term (current) use of anticoagulants: Secondary | ICD-10-CM | POA: Diagnosis not present

## 2016-05-17 DIAGNOSIS — I4891 Unspecified atrial fibrillation: Secondary | ICD-10-CM | POA: Diagnosis not present

## 2016-05-17 LAB — POCT INR
INR: 3.7
INR: 1.9

## 2016-06-21 ENCOUNTER — Ambulatory Visit (INDEPENDENT_AMBULATORY_CARE_PROVIDER_SITE_OTHER): Payer: Medicare Other | Admitting: Pharmacist Clinician (PhC)/ Clinical Pharmacy Specialist

## 2016-06-21 DIAGNOSIS — Z7901 Long term (current) use of anticoagulants: Secondary | ICD-10-CM

## 2016-06-21 DIAGNOSIS — I4891 Unspecified atrial fibrillation: Secondary | ICD-10-CM | POA: Diagnosis not present

## 2016-06-21 LAB — POCT INR: INR: 1.9

## 2016-06-21 NOTE — Addendum Note (Signed)
Addended by: Fidel Levy on: 06/21/2016 02:25 PM   Modules accepted: Orders

## 2016-06-30 ENCOUNTER — Encounter: Payer: Self-pay | Admitting: Physician Assistant

## 2016-06-30 ENCOUNTER — Ambulatory Visit (INDEPENDENT_AMBULATORY_CARE_PROVIDER_SITE_OTHER): Payer: Medicare Other | Admitting: Physician Assistant

## 2016-06-30 ENCOUNTER — Ambulatory Visit: Payer: Self-pay | Admitting: Internal Medicine

## 2016-06-30 VITALS — BP 124/80 | HR 87 | Temp 97.3°F | Resp 16 | Ht 64.0 in | Wt 150.8 lb

## 2016-06-30 DIAGNOSIS — E782 Mixed hyperlipidemia: Secondary | ICD-10-CM | POA: Diagnosis not present

## 2016-06-30 DIAGNOSIS — K2100 Gastro-esophageal reflux disease with esophagitis, without bleeding: Secondary | ICD-10-CM

## 2016-06-30 DIAGNOSIS — I2510 Atherosclerotic heart disease of native coronary artery without angina pectoris: Secondary | ICD-10-CM

## 2016-06-30 DIAGNOSIS — E559 Vitamin D deficiency, unspecified: Secondary | ICD-10-CM

## 2016-06-30 DIAGNOSIS — I482 Chronic atrial fibrillation, unspecified: Secondary | ICD-10-CM

## 2016-06-30 DIAGNOSIS — I10 Essential (primary) hypertension: Secondary | ICD-10-CM

## 2016-06-30 DIAGNOSIS — G629 Polyneuropathy, unspecified: Secondary | ICD-10-CM

## 2016-06-30 DIAGNOSIS — Z79899 Other long term (current) drug therapy: Secondary | ICD-10-CM

## 2016-06-30 DIAGNOSIS — Z950 Presence of cardiac pacemaker: Secondary | ICD-10-CM | POA: Diagnosis not present

## 2016-06-30 DIAGNOSIS — Z0001 Encounter for general adult medical examination with abnormal findings: Secondary | ICD-10-CM

## 2016-06-30 DIAGNOSIS — F3341 Major depressive disorder, recurrent, in partial remission: Secondary | ICD-10-CM

## 2016-06-30 DIAGNOSIS — Z0000 Encounter for general adult medical examination without abnormal findings: Secondary | ICD-10-CM

## 2016-06-30 DIAGNOSIS — R6889 Other general symptoms and signs: Secondary | ICD-10-CM

## 2016-06-30 DIAGNOSIS — Z7901 Long term (current) use of anticoagulants: Secondary | ICD-10-CM

## 2016-06-30 MED ORDER — NORTRIPTYLINE HCL 10 MG PO CAPS: ORAL_CAPSULE | ORAL | 0 refills | Status: DC

## 2016-06-30 NOTE — Progress Notes (Signed)
[name], female   DOB: 09-06-1927, 83 y.o.   MRN: [id number]  MEDICARE ANNUAL WELLNESS VISIT AND FOLLOW UP  Assessment:    Depression, remission -cont xanax prn  Essential hypertension -DASH Diet -monitor at home  Atherosclerosis of native coronary artery of native heart without angina pectoris -followed by cards   Chronic atrial fibrillation ([place]) -followed by cards  Gastroesophageal reflux disease, esophagitis presence not specified -cont well controlled   Osteoarthritis, unspecified osteoarthritis type, unspecified site -currently well controlled -tylenol prn   Hyperlipidemia -cont diet and exercise -followed by cards   Diffuse cystic mastopathy, unspecified laterality -refuses further mammograms   Long term current use of anticoagulant therapy -followed by cards  Pacemaker -followed by cards   Prediabetes -diet and exercise   Vitamin D deficiency -cont supplement   Medication management  Medicare annual wellness visit, subsequent  Peripheral polyneurpathy Try nortriptyline rather than gabapetin.   Over 30 minutes of exam, counseling, chart review, and critical decision making was performed  Future Appointments Date Time Provider [place]  07/21/2016 11:15 AM Sanda Klein, MD [facility]  07/21/2016 11:30 AM [facility]  01/07/2017 10:00 AM [name], MD [facility] None     Plan:   During the course of the visit the patient was educated and counseled about appropriate screening and preventive services including:    Pneumococcal vaccine   Influenza vaccine  Td vaccine  Prevnar 13  Screening electrocardiogram  Screening mammography  Bone densitometry screening  Colorectal cancer screening  Diabetes screening  Glaucoma screening  Nutrition counseling   Advanced directives: given info/requested copies    Subjective:   Misty Blackwell is a 83 y.o. female  who presents for Medicare Annual Wellness Visit and 6 month follow up depression.  She has afib, follows with coumadin clinic, last INR 1.9, she is on 1 tablet T,T,S,S and 1.5 tablets M,F,W.  She is on gabapentin for p. Neuropathy, 2 a day, has some imbalance with it.  Her blood pressure has been controlled at home, today their BP is BP: 124/80  She does not workout. She denies chest pain, shortness of breath, dizziness.  She is on cholesterol medication and denies myalgias. Her cholesterol is at goal. The cholesterol last visit was:   Lab Results  Component Value Date   CHOL 141 12/23/2015   HDL 40 (L) 12/23/2015   LDLCALC 80 12/23/2015   TRIG 106 12/23/2015   CHOLHDL 3.5 12/23/2015   Last A1C in the office was:  Lab Results  Component Value Date   HGBA1C 5.5 12/23/2015   Patient is on Vitamin D supplement.   Lab Results  Component Value Date   VD25OH 61 12/23/2015     BMI is Body mass index is 25.88 kg/m., she is working on diet and exercise. Wt Readings from Last 3 Encounters:  06/30/16 150 lb 12.8 oz (68.4 kg)  03/30/16 147 lb 3.2 oz (66.8 kg)  12/23/15 153 lb 3.2 oz (69.5 kg)    Medication Review Current Outpatient Prescriptions on File Prior to Visit  Medication Sig Dispense Refill  . ALPRAZolam (XANAX) 1 MG tablet take 1/2 to 1 tablet by mouth three times a day if needed for anxiety 90 tablet 5  . cholecalciferol (VITAMIN D) 1000 UNITS tablet Take 1,000 Units by mouth 3 (three) times daily.     . furosemide (LASIX) 40 MG tablet Take 0.5 tablets (20 mg total) by mouth daily as needed. 30 tablet 10  .  gabapentin (NEURONTIN) 300 MG capsule take 1 capsule by mouth twice a day 60 capsule 5  . potassium chloride SA (K-DUR,KLOR-CON) 20 MEQ tablet Take 1 tablet (20 mEq total) by mouth daily. 30 tablet 8  . TAZTIA XT 240 MG 24 hr capsule take 1 capsule by mouth once daily 30 capsule 10  . warfarin (COUMADIN) 3 MG tablet take 1 to 1 and 1/2 tablets by mouth as directed 45 tablet  3   No current facility-administered medications on file prior to visit.     Current Problems (verified) Patient Active Problem List   Diagnosis Date Noted  . Encounter for general adult medical examination with abnormal findings 12/23/2015  . Medicare annual wellness visit, subsequent 12/01/2014  . Prediabetes 08/15/2013  . Vitamin D deficiency 08/15/2013  . Medication management 08/15/2013  . Pacemaker 09/19/2012  . Atrial fibrillation ([place]) 07/18/2012  . Long term current use of anticoagulant therapy 07/18/2012  . Hyperlipidemia 09/03/2008  . Essential hypertension 09/03/2008  . Coronary atherosclerosis 09/03/2008  . GERD 09/03/2008  . FIBROCYSTIC BREAST DISEASE 09/03/2008  . Osteoarthritis 09/03/2008    Screening Tests Immunization History  Administered Date(s) Administered  . DT 03/19/2015  . Influenza Split 05/04/2011  . Influenza, High Dose Seasonal PF 03/19/2014, 12/23/2015  . Influenza,inj,quad, With Preservative 05/21/2013  . Influenza-Unspecified 02/04/2015  . Pneumococcal Conjugate-13 03/19/2014  . Pneumococcal Polysaccharide-23 05/04/2011  . Pneumococcal-Unspecified 05/03/2001  . Td 05/04/2003    Preventative care: Last colonoscopy: 2010 Last mammogram: refused DEXA declines  Prior vaccinations: TD or Tdap: 2016  Influenza: 2017  Pneumococcal: 2003 Prevnar13: 2015 Shingles/Zostavax: Declined  Names of Other Physician/Practitioners you currently use: 1. [facility]- here for primary care 2. Dr. Katy Fitch , eye doctor, last visit 2016, has OV march 13th 3. Dr. , dentist, last visit 2017 Patient Care Team: [name], MD as PCP - General (Internal Medicine) [name], MD as Consulting Physician (Urology) Penni Bombard, MD as Consulting Physician (Neurology) [name], MD as Consulting Physician (Neurology) Sanda Klein, MD as Consulting Physician (Cardiology) [name], MD as Consulting  Physician (Gastroenterology)  Allergies Allergies  Allergen Reactions  . Ace Inhibitors     Unknown   . Acrylic Polymer [Carbomer]   . Augmentin [Amoxicillin-Pot Clavulanate]   . Chocolate Other (See Comments)    migraine's   . Ciprofloxacin   . Gabapentin Other (See Comments)    Unsteady gait   . Levaquin [Levofloxacin In D5w]   . Zocor [Simvastatin]     SURGICAL HISTORY She  has a past surgical history that includes Cardioversion (11/19/2009); Cardiac catheterization (08/06/2005); Cholecystectomy; Appendectomy; Abdominal hysterectomy; and Skin cancer resection. FAMILY HISTORY Her family history includes Cirrhosis in her brother; Diabetes in her mother; Heart attack in her father; Heart disease in her father and mother. SOCIAL HISTORY She  reports that she has never smoked. She has never used smokeless tobacco. She reports that she does not drink alcohol or use drugs.  MEDICARE WELLNESS OBJECTIVES: Physical activity: Current Exercise Habits: The patient does not participate in regular exercise at present Cardiac risk factors: Cardiac Risk Factors include: advanced age (>62men, >48 women);dyslipidemia;hypertension;sedentary lifestyle Depression/mood screen:   Depression screen [facility] 2/9 06/30/2016  Decreased Interest 0  Down, Depressed, Hopeless 0  PHQ - 2 Score 0  Altered sleeping -  Tired, decreased energy -  Change in appetite -  Feeling bad or failure about yourself  -  Trouble concentrating -  Moving slowly or fidgety/restless -  Suicidal thoughts -  PHQ-9 Score -  Difficult doing work/chores -    ADLs:  In your present state of health, do you have any difficulty performing the following activities: 06/30/2016 12/23/2015  Hearing? N N  Vision? N -  Difficulty concentrating or making decisions? N N  Walking or climbing stairs? N N  Dressing or bathing? N N  Doing errands, shopping? N N  Preparing Food and eating ? - -  Using the Toilet? - -  In the past six months,  have you accidently leaked urine? - -  Do you have problems with loss of bowel control? - -  Managing your Medications? - -  Managing your Finances? - -  Housekeeping or managing your Housekeeping? - -  Some recent data might be hidden     Cognitive Testing  Alert? Yes  Normal Appearance?Yes  Oriented to person? Yes  Place? Yes   Time? Yes  Recall of three objects?  Yes  Can perform simple calculations? Yes  Displays appropriate judgment?Yes  Can read the correct time from a watch face?Yes  EOL planning: Does Patient Have a Medical Advance Directive?: No, Yes Type of Advance Directive: Healthcare Power of Attorney, Living will Copy of [place] in Chart?: No - copy requested   Objective:   Today's Vitals   06/30/16 1113  BP: 124/80  Pulse: 87  Resp: 16  Temp: 97.3 F (36.3 C)  SpO2: 99%  Weight: 150 lb 12.8 oz (68.4 kg)  Height: 5\' 4"  (1.626 m)  PainSc: 0-No pain   Body mass index is 25.88 kg/m.  General appearance: alert, no distress, WD/WN,  female HEENT: normocephalic, sclerae anicteric, TMs pearly, nares patent, no discharge or erythema, pharynx normal Oral cavity: MMM, no lesions Neck: supple, no lymphadenopathy, no thyromegaly, no masses Heart: RRR, normal S1, S2, no murmurs Lungs: CTA bilaterally, no wheezes, rhonchi, or rales Abdomen: +bs, soft, non tender, non distended, no masses, no hepatomegaly, no splenomegaly Musculoskeletal: nontender, no swelling, no obvious deformity Extremities: no edema, no cyanosis, no clubbing Pulses: 2+ symmetric, upper and lower extremities, normal cap refill Neurological: alert, oriented x 3, CN2-12 intact, strength normal upper extremities and lower extremities, sensation normal throughout, DTRs 2+ throughout, no cerebellar signs, gait normal Psychiatric: normal affect, behavior normal, pleasant  Breast: defer Gyn: defer Rectal: defer   Medicare Attestation I have personally reviewed: The patient's  medical and social history Their use of alcohol, tobacco or illicit drugs Their current medications and supplements The patient's functional ability including ADLs,fall risks, home safety risks, cognitive, and hearing and visual impairment Diet and physical activities Evidence for depression or mood disorders  The patient's weight, height, BMI, and visual acuity have been recorded in the chart.  I have made referrals, counseling, and provided education to the patient based on review of the above and I have provided the patient with a written personalized care plan for preventive services.     Vicie Mutters, PA-C   06/30/2016

## 2016-06-30 NOTE — Patient Instructions (Addendum)
Can try the the nortriptyline 1-3 pills at night OR 1 pill AM and 1-2 pills at night

## 2016-07-19 ENCOUNTER — Other Ambulatory Visit: Payer: Self-pay | Admitting: Internal Medicine

## 2016-07-21 ENCOUNTER — Ambulatory Visit (INDEPENDENT_AMBULATORY_CARE_PROVIDER_SITE_OTHER): Payer: Medicare Other | Admitting: Pharmacist Clinician (PhC)/ Clinical Pharmacy Specialist

## 2016-07-21 ENCOUNTER — Ambulatory Visit (INDEPENDENT_AMBULATORY_CARE_PROVIDER_SITE_OTHER): Payer: Medicare Other | Admitting: Cardiovascular Disease

## 2016-07-21 ENCOUNTER — Encounter: Payer: Self-pay | Admitting: Cardiovascular Disease

## 2016-07-21 VITALS — BP 110/80 | HR 66 | Ht 64.0 in | Wt 149.0 lb

## 2016-07-21 DIAGNOSIS — I10 Essential (primary) hypertension: Secondary | ICD-10-CM

## 2016-07-21 DIAGNOSIS — I4891 Unspecified atrial fibrillation: Secondary | ICD-10-CM

## 2016-07-21 DIAGNOSIS — Z950 Presence of cardiac pacemaker: Secondary | ICD-10-CM | POA: Diagnosis not present

## 2016-07-21 DIAGNOSIS — Z7901 Long term (current) use of anticoagulants: Secondary | ICD-10-CM

## 2016-07-21 DIAGNOSIS — I482 Chronic atrial fibrillation, unspecified: Secondary | ICD-10-CM

## 2016-07-21 DIAGNOSIS — I2510 Atherosclerotic heart disease of native coronary artery without angina pectoris: Secondary | ICD-10-CM | POA: Diagnosis not present

## 2016-07-21 LAB — CUP PACEART INCLINIC DEVICE CHECK
Battery Impedance: 997 Ohm
Battery Remaining Longevity: 59 mo
Battery Voltage: 2.78 V
Brady Statistic RV Percent Paced: 79 %
Implantable Lead Location: 753859
Implantable Lead Model: 4092
Implantable Lead Model: 4592
Lead Channel Impedance Value: 67 Ohm
Lead Channel Impedance Value: 674 Ohm
Lead Channel Pacing Threshold Amplitude: 0.5 V
Lead Channel Pacing Threshold Pulse Width: 0.4 ms
Lead Channel Pacing Threshold Pulse Width: 0.4 ms
Lead Channel Sensing Intrinsic Amplitude: 15.67 mV
Lead Channel Setting Pacing Amplitude: 2.5 V
MDC IDC LEAD IMPLANT DT: 20110428
MDC IDC LEAD IMPLANT DT: 20110428
MDC IDC LEAD LOCATION: 753860
MDC IDC MSMT LEADCHNL RV PACING THRESHOLD AMPLITUDE: 0.625 V
MDC IDC PG IMPLANT DT: 20110428
MDC IDC SESS DTM: 20180321143813
MDC IDC SET LEADCHNL RV PACING PULSEWIDTH: 0.4 ms
MDC IDC SET LEADCHNL RV SENSING SENSITIVITY: 5.6 mV

## 2016-07-21 LAB — POCT INR: INR: 2

## 2016-07-21 NOTE — Patient Instructions (Signed)
Dr Sallyanne Kuster recommends that you continue on your current medications as directed. Please refer to the Current Medication list given to you today.  Remote monitoring is used to monitor your Pacemaker of ICD from home. This monitoring reduces the number of office visits required to check your device to one time per year. It allows [place] to keep an eye on the functioning of your device to ensure it is working properly. You are scheduled for a device check from home on Wednesday, June 20th, 2018. You may send your transmission at any time that day. If you have a wireless device, the transmission will be sent automatically. After your physician reviews your transmission, you will receive a postcard with your next transmission date.  Dr Sallyanne Kuster recommends that you schedule a follow-up appointment in 12 months with a pacemaker check. You will receive a reminder letter in the mail two months in advance. If you don't receive a letter, please call our office to schedule the follow-up appointment.  If you need a refill on your cardiac medications before your next appointment, please call your pharmacy.

## 2016-07-21 NOTE — Progress Notes (Signed)
[name], female   DOB: Jun 11, 1927, 82 y.o.   MRN: [id number]    Cardiology Office Note    Date:  07/21/2016   [name], DOB 1928-01-17, [name]  PCP:  Alesia Richards, MD  Cardiologist:   Sanda Klein, MD   Chief Complaint  Patient presents with  . Follow-up    pt states she felt funny in her chest 3 months ago, came up here and saw kristen and EKG was performed    History of Present Illness:  Misty Blackwell is a 83 y.o. female who presents for permanent atrial fibrillation with slow ventricular response and pacemaker, remote TIA, HTN, minor CAD, asymptomatic pacemaker-detected NSVT, history of diastolic HF.  She generally feels well. About a month ago she had a one-hour episode of weakness, mild nausea and near-syncope that she thought was similar to symptoms she had when her arrhythmia was initially diagnosed. She sat down and the symptoms gradually resolved spontaneously. Interrogation of her pacemaker does not show any evidence of ventricular tachycardia. In February there was a brief period of high ventricular rate that looks like atrial fibrillation with RVR. Otherwise she has not had angina, dyspnea, edema, full blown syncope, palpitations or leg edema. Her ankles swell infrequently and she takes as needed furosemide. This happens less than once a month.  Pacemaker interrogation shows normal device function. She has approximate 5 years of estimated generator longevity. She is in permanent atrial fibrillation with slow ventricular response and requires 79% ventricular pacing, with very rare episodes of high ventricular rate. Her device is a Medtronic Adapta dual-chamber device implanted in 2011 for SSS, now programmed VVIR for permanent atrial fibrillation.  She denies any neurological events or bleeding complications on warfarin anticoagulation. Today INR is 2.0..    Past Medical History:  Diagnosis Date  . Atrial fib/flutter, transient     . Atrial fibrillation, chronic ([place])   . CHF (congestive heart failure) ([place]) 10/29/2009   Echo - EF >55%; normal LV size and systolic function; unable to assess diastolic fcn due to E/A fusion, pulmonary vein flow pattern suggests elevated filling pressure; marked biatrail dilation, mild/mod tricuspid regurgitation; mod pulmonary htn; mild/mod mitral regurgitation; although echocardiographic features are incomplete findings suggest possible infiltrative cardiomyopathy (maybe amyloidosi  . Coronary artery disease 03/19/2002   R/P Cardiolite - EF 76%; nromal static and dynamic myocardial perfusion images; normal wall motion and endocardial thickening in all vascular territories  . Facial numbness 12/26/2008   carotid doppler - R and L ICAs 0-49% diameter reduction (velocities suggest low end of scale)  . Hypertension   . Pacemaker   . Peripheral neuropathy ([place])   . Skin cancer    s/p surgical removal.  . TIA (transient ischemic attack)     Past Surgical History:  Procedure Laterality Date  . ABDOMINAL HYSTERECTOMY    . APPENDECTOMY    . CARDIAC CATHETERIZATION  08/06/2005   minimal coronary disease predominant RCA; no significant atherosclerosis; new onset sick sinus syndrome and atrial flutter w/ ventricular response, controlled on med therapy; systemic HTN, normal renal arteries  . CARDIOVERSION  11/19/2009   successful DCCV from AF to sinus type rhythm  . CHOLECYSTECTOMY    . Skin cancer resection      Outpatient Medications Prior to Visit  Medication Sig Dispense Refill  . ALPRAZolam (XANAX) 1 MG tablet take 1/2 to 1 tablet by mouth three times a day if needed for anxiety 90 tablet 5  .  cholecalciferol (VITAMIN D) 1000 UNITS tablet Take 1,000 Units by mouth 3 (three) times daily.     . furosemide (LASIX) 40 MG tablet Take 0.5 tablets (20 mg total) by mouth daily as needed. 30 tablet 10  . gabapentin (NEURONTIN) 300 MG capsule take 1 capsule by mouth twice a day 60 capsule 5  .  potassium chloride SA (K-DUR,KLOR-CON) 20 MEQ tablet Take 1 tablet (20 mEq total) by mouth daily. 30 tablet 8  . TAZTIA XT 240 MG 24 hr capsule take 1 capsule by mouth once daily 30 capsule 10  . warfarin (COUMADIN) 3 MG tablet take 1 to 1 and 1/2 tablets by mouth as directed 45 tablet 3  . nortriptyline (PAMELOR) 10 MG capsule 1-3 pills at bed time for pain (Patient not taking: Reported on 07/21/2016) 30 capsule 0   No facility-administered medications prior to visit.      Allergies:   Ace inhibitors; Acrylic polymer [carbomer]; Augmentin [amoxicillin-pot clavulanate]; Ciprofloxacin; Levaquin [levofloxacin in d5w]; Zocor [simvastatin]; Chocolate; and Gabapentin   Social History   Social History  . Marital status: Married    Spouse name: N/A  . Number of children: 0  . Years of education: N/A   Social History Main Topics  . Smoking status: Never Smoker  . Smokeless tobacco: Never Used  . Alcohol use No  . Drug use: No  . Sexual activity: No   Other Topics Concern  . Not on file   Social History Narrative   Widowed.  Lives alone.  Ambulates independently.     Family History:  The patient's family history includes Cirrhosis in her brother; Diabetes in her mother; Heart attack in her father; Heart disease in her father and mother.   ROS:   Please see the history of present illness.    ROS All other systems reviewed and are negative.   PHYSICAL EXAM:   VS:  BP 110/80 (BP Location: Right Arm)   Pulse 66   Ht 5\' 4"  (1.626 m)   Wt 67.6 kg (149 lb)   BMI 25.58 kg/m    GEN: Well nourished, well developed, in no acute distress  HEENT: normal  Neck: no JVD, carotid bruits, or masses Cardiac: Irregular ; no murmurs, rubs, or gallops,no edema healthy left subclavian pacemaker site Respiratory:  clear to auscultation bilaterally, normal work of breathing GI: soft, nontender, nondistended, + BS MS: no deformity or atrophy  Skin: warm and dry, no rash Neuro:  Alert and Oriented  x 3, Strength and sensation are intact Psych: euthymic mood, full affect  Wt Readings from Last 3 Encounters:  07/21/16 67.6 kg (149 lb)  06/30/16 68.4 kg (150 lb 12.8 oz)  03/30/16 66.8 kg (147 lb 3.2 oz)      Studies/Labs Reviewed:   EKG:  EKG is not ordered today.  The ekg ordered 06/21/2016 demonstrates Atrial fibrillation with intermittent ventricular pacing, underlying left ventricular hypertrophy with .prominent repolarization abnormalities  Recent Labs: 12/23/2015: ALT 18; BUN 12; Creat 0.73; Hemoglobin 14.2; Magnesium 1.7; Platelets 260; Potassium 3.8; Sodium 142; TSH 1.14   Lipid Panel    Component Value Date/Time   CHOL 141 12/23/2015 1141   TRIG 106 12/23/2015 1141   HDL 40 (L) 12/23/2015 1141   CHOLHDL 3.5 12/23/2015 1141   VLDL 21 12/23/2015 1141   LDLCALC 80 12/23/2015 1141     ASSESSMENT:    1. Atherosclerosis of native coronary artery of native heart without angina pectoris   2. Chronic atrial fibrillation (HCC)  3. Essential hypertension   4. Long term current use of anticoagulant therapy   5. Pacemaker      PLAN:  In order of problems listed above:  1. AFib with slow ventricular response and high percentage of ventricular pacing. Will not reduce the diltiazem since she has occasional episodes of high ventricular rate that appeared to be symptomatic. High embolic risk with history of previous transient ischemic attack (CHADSVasc 6: age 4, TIA 2, HTN, CAD).  2. CAD: Currently asymptomatic. She is intolerant to statins and does not want to try to take these again. 3. HTN: very well-controlled 4. Warfarin: Well tolerated, no bleeding complications. Continue warfarin. INR 2.0 today. 5. PPM: Normal device function. Not dependent. Remote downloads every 3 months and office visit yearly   Medication Adjustments/Labs and Tests Ordered: Current medicines are reviewed at length with the patient today.  Concerns regarding medicines are outlined above.   Medication changes, Labs and Tests ordered today are listed in the Patient Instructions below. Patient Instructions  Dr Sallyanne Kuster recommends that you continue on your current medications as directed. Please refer to the Current Medication list given to you today.  Remote monitoring is used to monitor your Pacemaker of ICD from home. This monitoring reduces the number of office visits required to check your device to one time per year. It allows [place] to keep an eye on the functioning of your device to ensure it is working properly. You are scheduled for a device check from home on Wednesday, June 20th, 2018. You may send your transmission at any time that day. If you have a wireless device, the transmission will be sent automatically. After your physician reviews your transmission, you will receive a postcard with your next transmission date.  Dr Sallyanne Kuster recommends that you schedule a follow-up appointment in 12 months with a pacemaker check. You will receive a reminder letter in the mail two months in advance. If you don't receive a letter, please call our office to schedule the follow-up appointment.  If you need a refill on your cardiac medications before your next appointment, please call your pharmacy.      Signed, Sanda Klein, MD  07/21/2016 2:51 PM    [address], [place],   [address] Phone: [phone]; Fax: [phone]

## 2016-08-16 ENCOUNTER — Ambulatory Visit (INDEPENDENT_AMBULATORY_CARE_PROVIDER_SITE_OTHER): Payer: Medicare Other | Admitting: Pharmacist Clinician (PhC)/ Clinical Pharmacy Specialist

## 2016-08-16 DIAGNOSIS — Z7901 Long term (current) use of anticoagulants: Secondary | ICD-10-CM | POA: Diagnosis not present

## 2016-08-16 DIAGNOSIS — I4891 Unspecified atrial fibrillation: Secondary | ICD-10-CM

## 2016-08-16 LAB — POCT INR: INR: 1.8

## 2016-08-23 ENCOUNTER — Other Ambulatory Visit: Payer: Self-pay | Admitting: Cardiovascular Disease

## 2016-08-23 NOTE — Telephone Encounter (Signed)
Rx(s) sent to pharmacy electronically.  

## 2016-08-31 ENCOUNTER — Ambulatory Visit (INDEPENDENT_AMBULATORY_CARE_PROVIDER_SITE_OTHER): Payer: Medicare Other | Admitting: Pharmacist Clinician (PhC)/ Clinical Pharmacy Specialist

## 2016-08-31 DIAGNOSIS — Z7901 Long term (current) use of anticoagulants: Secondary | ICD-10-CM

## 2016-08-31 DIAGNOSIS — I4891 Unspecified atrial fibrillation: Secondary | ICD-10-CM

## 2016-08-31 LAB — POCT INR: INR: 1.9

## 2016-09-20 ENCOUNTER — Other Ambulatory Visit: Payer: Self-pay | Admitting: Cardiovascular Disease

## 2016-09-21 ENCOUNTER — Ambulatory Visit (INDEPENDENT_AMBULATORY_CARE_PROVIDER_SITE_OTHER): Payer: Medicare Other | Admitting: Pharmacist Clinician (PhC)/ Clinical Pharmacy Specialist

## 2016-09-21 DIAGNOSIS — Z7901 Long term (current) use of anticoagulants: Secondary | ICD-10-CM | POA: Diagnosis not present

## 2016-09-21 DIAGNOSIS — I482 Chronic atrial fibrillation, unspecified: Secondary | ICD-10-CM

## 2016-09-21 LAB — POCT INR: INR: 2.1

## 2016-09-24 NOTE — Progress Notes (Signed)
3 MONTH FOLLOW UP  Assessment:   Essential hypertension - continue medications, DASH diet, exercise and monitor at home. Call if greater than 130/80.  - CBC with Differential/Platelet - BASIC METABOLIC PANEL WITH GFR - Hepatic function panel - TSH  Chronic atrial fibrillation Continue coumadin, Continue cardio follow up   Hyperlipidemia -continue medications, check lipids, decrease fatty foods, increase activity.  - Lipid panel   Depression, remission Seeing a counselor, declines medicine.    Long term current use of anticoagulant therapy Continue cardio follow up  Medication management - Magnesium  Anemia, unspecified type -     Iron and TIBC -     Vitamin B12  Other orders -     nortriptyline (PAMELOR) 10 MG capsule; 1 pill up to 3 x a daily for neuropathy/numbness/pain in your feet   Future Appointments Date Time Provider [place]  10/19/2016 9:00 AM [facility]  10/20/2016 9:50 AM [facility] DEVICE REMOTES [facility]  11/16/2016 11:00 AM Landis Martins, DPM [facility]  01/07/2017 10:00 AM [name], MD [facility] None     Subjective:   Misty Blackwell is a 83 y.o. female who presents for 3 month follow up on hypertension, prediabetes, hyperlipidemia, vitamin D def.   Her blood pressure has been controlled at home, today their BP is BP: 114/72 She does not workout. She denies chest pain, dizziness.  She follows Dr. Sallyanne Kuster for ASHD and mild pulmonary artery hypertension, last cath 2007 and normal cardiolite 2003, afib with slow RVR with pacemaker insertion in 2011. She follows at the coumadin clinic. She has been complaining of SOB, saw Dr. Sallyanne Kuster on 07/08/2014,  had normal echo, normal pacemaker function. Lab Results  Component Value Date   INR 2.1 09/21/2016   INR 1.9 08/31/2016   INR 1.8 08/16/2016   She has had a history of TIA 2006 with out reoccurrence, is on coumadin.  She is  not on cholesterol medication and denies myalgias. Her cholesterol is at goal. The cholesterol last visit was:   Lab Results  Component Value Date   CHOL 141 12/23/2015   HDL 40 (L) 12/23/2015   LDLCALC 80 12/23/2015   TRIG 106 12/23/2015   CHOLHDL 3.5 12/23/2015   She has been working on diet and exercise for prediabetes, She has neuropathy in bilateral feet and denies polydipsia and polyuria. 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 She is on 300 mg two a day, 1 AM and 1 PM. Going to see podiatrist.  Her husband passed last march, she is now in counseling through her church.  Patient is on Vitamin D supplement. Lab Results  Component Value Date   VD25OH 61 12/23/2015     Medication Review Current Outpatient Prescriptions on File Prior to Visit  Medication Sig Dispense Refill  . ALPRAZolam (XANAX) 1 MG tablet take 1/2 to 1 tablet by mouth three times a day if needed for anxiety 90 tablet 5  . cholecalciferol (VITAMIN D) 1000 UNITS tablet Take 1,000 Units by mouth 3 (three) times daily.     Marland Kitchen diltiazem (TAZTIA XT) 240 MG 24 hr capsule Take 1 capsule (240 mg total) by mouth daily. 30 capsule 11  . furosemide (LASIX) 40 MG tablet Take 0.5 tablets (20 mg total) by  mouth daily as needed. 30 tablet 10  . gabapentin (NEURONTIN) 300 MG capsule take 1 capsule by mouth twice a day 60 capsule 5  . potassium chloride SA (K-DUR,KLOR-CON) 20 MEQ tablet Take 1 tablet (20 mEq total) by mouth daily. 30 tablet 8  . warfarin (COUMADIN) 3 MG tablet TAKE 1 TO 1 AND A 1/2 TABLETS BY MOUTH AS DIRECTED 45 tablet 3   No current facility-administered medications on file prior to visit.     Current Problems (verified) Patient Active Problem List   Diagnosis Date Noted  . Encounter for general adult medical examination  with abnormal findings 12/23/2015  . Medicare annual wellness visit, subsequent 12/01/2014  . Prediabetes 08/15/2013  . Vitamin D deficiency 08/15/2013  . Medication management 08/15/2013  . Pacemaker 09/19/2012  . Atrial fibrillation ([place]) 07/18/2012  . Long term current use of anticoagulant therapy 07/18/2012  . Hyperlipidemia 09/03/2008  . Essential hypertension 09/03/2008  . Coronary atherosclerosis 09/03/2008  . GERD 09/03/2008  . FIBROCYSTIC BREAST DISEASE 09/03/2008  . Osteoarthritis 09/03/2008    Allergies  Allergen Reactions  . Ace Inhibitors     Unknown   . Acrylic Polymer [Carbomer]   . Augmentin [Amoxicillin-Pot Clavulanate]   . Ciprofloxacin   . Levaquin [Levofloxacin In D5w]   . Zocor [Simvastatin]   . Chocolate Other (See Comments)    migraine's   . Gabapentin Other (See Comments)    Unsteady gait      Objective:   Blood pressure 114/72, pulse 83, temperature 97.5 F (36.4 C), resp. rate 14, height 5\' 4"  (1.626 m), weight 150 lb 9.6 oz (68.3 kg), SpO2 96 %. Body mass index is 25.85 kg/m.  General appearance: alert, no distress, WD/WN,  female HEENT: normocephalic, sclerae anicteric, TMs pearly, nares patent, no discharge or erythema, pharynx normal Oral cavity: MMM, no lesions Neck: supple, no lymphadenopathy, no thyromegaly, no masses Heart: RRR, normal S1, S2, no murmurs Lungs: CTA bilaterally, no wheezes, rhonchi, or rales Abdomen: +bs, soft, non tender, non distended, no masses, no hepatomegaly, no splenomegaly Musculoskeletal: nontender, no swelling, no obvious deformity Extremities: no edema, no cyanosis, no clubbing Pulses: 2+ symmetric, upper and lower extremities, normal cap refill Neurological: alert, oriented x 3, CN2-12 intact, strength normal upper extremities and lower extremities, sensation normal throughout, DTRs 2+ throughout, no cerebellar signs, gait normal Psychiatric: normal affect, behavior normal, pleasant     Vicie Mutters,  PA-C   09/28/2016

## 2016-09-28 ENCOUNTER — Encounter: Payer: Self-pay | Admitting: Physician Assistant

## 2016-09-28 ENCOUNTER — Ambulatory Visit: Payer: Self-pay | Admitting: Internal Medicine

## 2016-09-28 ENCOUNTER — Ambulatory Visit (INDEPENDENT_AMBULATORY_CARE_PROVIDER_SITE_OTHER): Payer: Medicare Other | Admitting: Physician Assistant

## 2016-09-28 VITALS — BP 114/72 | HR 83 | Temp 97.5°F | Resp 14 | Ht 64.0 in | Wt 150.6 lb

## 2016-09-28 DIAGNOSIS — F3341 Major depressive disorder, recurrent, in partial remission: Secondary | ICD-10-CM | POA: Diagnosis not present

## 2016-09-28 DIAGNOSIS — E559 Vitamin D deficiency, unspecified: Secondary | ICD-10-CM

## 2016-09-28 DIAGNOSIS — D649 Anemia, unspecified: Secondary | ICD-10-CM | POA: Diagnosis not present

## 2016-09-28 DIAGNOSIS — Z79899 Other long term (current) drug therapy: Secondary | ICD-10-CM | POA: Diagnosis not present

## 2016-09-28 DIAGNOSIS — I482 Chronic atrial fibrillation, unspecified: Secondary | ICD-10-CM

## 2016-09-28 DIAGNOSIS — E782 Mixed hyperlipidemia: Secondary | ICD-10-CM | POA: Diagnosis not present

## 2016-09-28 DIAGNOSIS — Z7901 Long term (current) use of anticoagulants: Secondary | ICD-10-CM

## 2016-09-28 DIAGNOSIS — I10 Essential (primary) hypertension: Secondary | ICD-10-CM

## 2016-09-28 LAB — BASIC METABOLIC PANEL WITH GFR
BUN: 21 mg/dL (ref 7–25)
CHLORIDE: 104 mmol/L (ref 98–110)
CO2: 29 mmol/L (ref 20–31)
CREATININE: 0.97 mg/dL — AB (ref 0.60–0.88)
Calcium: 9.6 mg/dL (ref 8.6–10.4)
GFR, EST AFRICAN AMERICAN: 61 mL/min (ref >=60)
GFR, Est Non African American: 53 mL/min — ABNORMAL LOW (ref >=60)
GLUCOSE: 83 mg/dL (ref 65–99)
POTASSIUM: 4.4 mmol/L (ref 3.5–5.3)
Sodium: 141 mmol/L (ref 135–146)

## 2016-09-28 LAB — CBC WITH DIFFERENTIAL/PLATELET
BASOS ABS: 79 {cells}/uL (ref 0–200)
BASOS PCT: 1 %
EOS ABS: 79 {cells}/uL (ref 15–500)
Eosinophils Relative: 1 %
HEMATOCRIT: 44.5 % (ref 35.0–45.0)
Hemoglobin: 14.5 g/dL (ref 11.7–15.5)
LYMPHS PCT: 32 %
Lymphs Abs: 2528 cells/uL (ref 850–3900)
MCH: 30.3 pg (ref 27.0–33.0)
MCHC: 32.6 g/dL (ref 32.0–36.0)
MCV: 92.9 fL (ref 80.0–100.0)
MONO ABS: 790 {cells}/uL (ref 200–950)
MONOS PCT: 10 %
MPV: 10.7 fL (ref 7.5–12.5)
Neutro Abs: 4424 cells/uL (ref 1500–7800)
Neutrophils Relative %: 56 %
PLATELETS: 238 10*3/uL (ref 140–400)
RBC: 4.79 MIL/uL (ref 3.80–5.10)
RDW: 13.2 % (ref 11.0–15.0)
WBC: 7.9 10*3/uL (ref 3.8–10.8)

## 2016-09-28 LAB — LIPID PANEL
Cholesterol: 168 mg/dL (ref <200)
HDL: 42 mg/dL — ABNORMAL LOW (ref >50)
LDL CALC: 92 mg/dL (ref <100)
Total CHOL/HDL Ratio: 4 Ratio (ref <5.0)
Triglycerides: 172 mg/dL — ABNORMAL HIGH (ref <150)
VLDL: 34 mg/dL — ABNORMAL HIGH (ref <30)

## 2016-09-28 LAB — HEPATIC FUNCTION PANEL
ALBUMIN: 4.2 g/dL (ref 3.6–5.1)
ALK PHOS: 104 U/L (ref 33–130)
ALT: 15 U/L (ref 6–29)
AST: 18 U/L (ref 10–35)
Bilirubin, Direct: 0.2 mg/dL (ref <=0.2)
Indirect Bilirubin: 0.5 mg/dL (ref 0.2–1.2)
TOTAL PROTEIN: 6.9 g/dL (ref 6.1–8.1)
Total Bilirubin: 0.7 mg/dL (ref 0.2–1.2)

## 2016-09-28 LAB — IRON AND TIBC
%SAT: 53 % — ABNORMAL HIGH (ref 11–50)
Iron: 174 ug/dL — ABNORMAL HIGH (ref 45–160)
TIBC: 331 ug/dL (ref 250–450)
UIBC: 157 ug/dL

## 2016-09-28 LAB — TSH: TSH: 1.01 m[IU]/L

## 2016-09-28 MED ORDER — NORTRIPTYLINE HCL 10 MG PO CAPS: ORAL_CAPSULE | ORAL | 2 refills | Status: DC

## 2016-09-28 NOTE — Patient Instructions (Signed)
Peripheral Neuropathy Peripheral neuropathy is a type of nerve damage. It affects nerves that carry signals between the spinal cord and other parts of the body. These are called peripheral nerves. With peripheral neuropathy, one nerve or a group of nerves may be damaged. What are the causes? Many things can damage peripheral nerves. For some people with peripheral neuropathy, the cause is unknown. Some causes include:  Diabetes. This is the most common cause of peripheral neuropathy.  Injury to a nerve.  Pressure or stress on a nerve that lasts a long time.  Too little vitamin B. Alcoholism can lead to this.  Infections.  Autoimmune diseases, such as multiple sclerosis and systemic lupus erythematosus.  Inherited nerve diseases.  Some medicines, such as cancer drugs.  Toxic substances, such as lead and mercury.  Too little blood flowing to the legs.  Kidney disease.  Thyroid disease.  What are the signs or symptoms? Different people have different symptoms. The symptoms you have will depend on which of your nerves is damaged. Common symptoms include:  Loss of feeling (numbness) in the feet and hands.  Tingling in the feet and hands.  Pain that burns.  Very sensitive skin.  Weakness.  Not being able to move a part of the body (paralysis).  Muscle twitching.  Clumsiness or poor coordination.  Loss of balance.  Not being able to control your bladder.  Feeling dizzy.  Sexual problems.  How is this diagnosed? Peripheral neuropathy is a symptom, not a disease. Finding the cause of peripheral neuropathy can be hard. To figure that out, your health care provider will take a medical history and do a physical exam. A neurological exam will also be done. This involves checking things affected by your brain, spinal cord, and nerves (nervous system). For example, your health care provider will check your reflexes, how you move, and what you can feel. Other types of tests  may also be ordered, such as:  Blood tests.  A test of the fluid in your spinal cord.  Imaging tests, such as CT scans or an MRI.  Electromyography (EMG). This test checks the nerves that control muscles.  Nerve conduction velocity tests. These tests check how fast messages pass through your nerves.  Nerve biopsy. A small piece of nerve is removed. It is then checked under a microscope.  How is this treated?  Medicine is often used to treat peripheral neuropathy. Medicines may include: ? Pain-relieving medicines. Prescription or over-the-counter medicine may be suggested. ? Antiseizure medicine. This may be used for pain. ? Antidepressants. These also may help ease pain from neuropathy. ? Lidocaine. This is a numbing medicine. You might wear a patch or be given a shot. ? Mexiletine. This medicine is typically used to help control irregular heart rhythms.  Surgery. Surgery may be needed to relieve pressure on a nerve or to destroy a nerve that is causing pain.  Physical therapy to help movement.  Assistive devices to help movement. Follow these instructions at home:  Only take over-the-counter or prescription medicines as directed by your health care provider. Follow the instructions carefully for any given medicines. Do not take any other medicines without first getting approval from your health care provider.  If you have diabetes, work closely with your health care provider to keep your blood sugar under control.  If you have numbness in your feet: ? Check every day for signs of injury or infection. Watch for redness, warmth, and swelling. ? Wear padded socks and comfortable   shoes. These help protect your feet.  Do not do things that put pressure on your damaged nerve.  Do not smoke. Smoking keeps blood from getting to damaged nerves.  Avoid or limit alcohol. Too much alcohol can cause a lack of B vitamins. These vitamins are needed for healthy nerves.  Develop a good  support system. Coping with peripheral neuropathy can be stressful. Talk to a mental health specialist or join a support group if you are struggling.  Follow up with your health care provider as directed. Contact a health care provider if:  You have new signs or symptoms of peripheral neuropathy.  You are struggling emotionally from dealing with peripheral neuropathy.  You have a fever. Get help right away if:  You have an injury or infection that is not healing.  You feel very dizzy or begin vomiting.  You have chest pain.  You have trouble breathing. This information is not intended to replace advice given to you by your health care provider. Make sure you discuss any questions you have with your health care provider. Document Released: 04/09/2002 Document Revised: 09/25/2015 Document Reviewed: 12/25/2012 Elsevier Interactive Patient Education  2017 [employer]  

## 2016-09-29 ENCOUNTER — Other Ambulatory Visit: Payer: Self-pay | Admitting: Physician Assistant

## 2016-09-29 DIAGNOSIS — D649 Anemia, unspecified: Secondary | ICD-10-CM

## 2016-09-29 DIAGNOSIS — Z79899 Other long term (current) drug therapy: Secondary | ICD-10-CM

## 2016-09-29 LAB — MAGNESIUM: Magnesium: 2 mg/dL (ref 1.5–2.5)

## 2016-09-29 LAB — VITAMIN D 25 HYDROXY (VIT D DEFICIENCY, FRACTURES): Vit D, 25-Hydroxy: 53 ng/mL (ref 30–100)

## 2016-09-29 LAB — VITAMIN B12: VITAMIN B 12: 1294 pg/mL — AB (ref 200–1100)

## 2016-09-29 NOTE — Progress Notes (Signed)
Pt aware of lab results & voiced understanding of those results.

## 2016-10-19 ENCOUNTER — Ambulatory Visit (INDEPENDENT_AMBULATORY_CARE_PROVIDER_SITE_OTHER): Payer: Medicare Other | Admitting: Pharmacist

## 2016-10-19 DIAGNOSIS — I482 Chronic atrial fibrillation, unspecified: Secondary | ICD-10-CM

## 2016-10-19 DIAGNOSIS — Z7901 Long term (current) use of anticoagulants: Secondary | ICD-10-CM | POA: Diagnosis not present

## 2016-10-19 LAB — POCT INR: INR: 2.3

## 2016-10-20 ENCOUNTER — Ambulatory Visit (INDEPENDENT_AMBULATORY_CARE_PROVIDER_SITE_OTHER): Payer: Medicare Other | Admitting: *Deleted

## 2016-10-20 DIAGNOSIS — I482 Chronic atrial fibrillation, unspecified: Secondary | ICD-10-CM

## 2016-10-21 NOTE — Progress Notes (Signed)
Remote pacemaker transmission.   

## 2016-10-22 ENCOUNTER — Encounter: Payer: Self-pay | Admitting: Cardiology

## 2016-10-27 LAB — CUP PACEART REMOTE DEVICE CHECK
Battery Impedance: 1130 Ohm
Brady Statistic RV Percent Paced: 75 %
Date Time Interrogation Session: 20180620123519
Implantable Lead Implant Date: 20110428
Implantable Lead Implant Date: 20110428
Implantable Lead Location: 753860
Implantable Lead Model: 4092
Lead Channel Impedance Value: 67 Ohm
Lead Channel Impedance Value: 716 Ohm
Lead Channel Pacing Threshold Pulse Width: 0.4 ms
Lead Channel Setting Pacing Amplitude: 2.5 V
Lead Channel Setting Pacing Pulse Width: 0.4 ms
Lead Channel Setting Sensing Sensitivity: 5.6 mV
MDC IDC LEAD LOCATION: 753859
MDC IDC MSMT BATTERY REMAINING LONGEVITY: 55 mo
MDC IDC MSMT BATTERY VOLTAGE: 2.78 V
MDC IDC MSMT LEADCHNL RV PACING THRESHOLD AMPLITUDE: 0.5 V
MDC IDC PG IMPLANT DT: 20110428

## 2016-10-28 ENCOUNTER — Ambulatory Visit (INDEPENDENT_AMBULATORY_CARE_PROVIDER_SITE_OTHER): Payer: Medicare Other | Admitting: *Deleted

## 2016-10-28 DIAGNOSIS — D649 Anemia, unspecified: Secondary | ICD-10-CM

## 2016-10-28 DIAGNOSIS — Z79899 Other long term (current) drug therapy: Secondary | ICD-10-CM

## 2016-10-28 NOTE — Progress Notes (Signed)
Patient is here for a NV to recheck her ferritin level and BMET.  The patient states she has increased her fluid intake.  She is in no distress today.

## 2016-10-29 LAB — BASIC METABOLIC PANEL WITH GFR
BUN: 19 mg/dL (ref 7–25)
CHLORIDE: 101 mmol/L (ref 98–110)
CO2: 27 mmol/L (ref 20–31)
Calcium: 9.7 mg/dL (ref 8.6–10.4)
Creat: 0.89 mg/dL — ABNORMAL HIGH (ref 0.60–0.88)
GFR, Est African American: 67 mL/min (ref >=60)
GFR, Est Non African American: 58 mL/min — ABNORMAL LOW (ref >=60)
GLUCOSE: 90 mg/dL (ref 65–99)
Potassium: 4.5 mmol/L (ref 3.5–5.3)
Sodium: 142 mmol/L (ref 135–146)

## 2016-10-29 LAB — FERRITIN: Ferritin: 144 ng/mL (ref 20–288)

## 2016-10-29 NOTE — Progress Notes (Signed)
LVM for pt to return office call for LAB results.

## 2016-11-01 ENCOUNTER — Other Ambulatory Visit: Payer: Self-pay

## 2016-11-01 MED ORDER — POTASSIUM CHLORIDE CRYS ER 20 MEQ PO TBCR: 20.0000 meq | EXTENDED_RELEASE_TABLET | Freq: Every day | ORAL | 8 refills | Status: DC

## 2016-11-01 MED ORDER — ALPRAZOLAM 1 MG PO TABS: ORAL_TABLET | ORAL | 0 refills | Status: DC

## 2016-11-01 MED ORDER — FUROSEMIDE 40 MG PO TABS: 20.0000 mg | ORAL_TABLET | Freq: Every day | ORAL | 2 refills | Status: DC | PRN

## 2016-11-01 MED ORDER — ALPRAZOLAM 1 MG PO TABS: ORAL_TABLET | ORAL | 5 refills | Status: DC

## 2016-11-16 ENCOUNTER — Ambulatory Visit (INDEPENDENT_AMBULATORY_CARE_PROVIDER_SITE_OTHER): Payer: Medicare Other | Admitting: Sports Medicine

## 2016-11-16 ENCOUNTER — Encounter: Payer: Self-pay | Admitting: Sports Medicine

## 2016-11-16 DIAGNOSIS — G609 Hereditary and idiopathic neuropathy, unspecified: Secondary | ICD-10-CM | POA: Diagnosis not present

## 2016-11-16 NOTE — Patient Instructions (Signed)
Peripheral Neuropathy Peripheral neuropathy is a type of nerve damage. It affects nerves that carry signals between the spinal cord and other parts of the body. These are called peripheral nerves. With peripheral neuropathy, one nerve or a group of nerves may be damaged. What are the causes? Many things can damage peripheral nerves. For some people with peripheral neuropathy, the cause is unknown. Some causes include:  Diabetes. This is the most common cause of peripheral neuropathy.  Injury to a nerve.  Pressure or stress on a nerve that lasts a long time.  Too little vitamin B. Alcoholism can lead to this.  Infections.  Autoimmune diseases, such as multiple sclerosis and systemic lupus erythematosus.  Inherited nerve diseases.  Some medicines, such as cancer drugs.  Toxic substances, such as lead and mercury.  Too little blood flowing to the legs.  Kidney disease.  Thyroid disease.  What are the signs or symptoms? Different people have different symptoms. The symptoms you have will depend on which of your nerves is damaged. Common symptoms include:  Loss of feeling (numbness) in the feet and hands.  Tingling in the feet and hands.  Pain that burns.  Very sensitive skin.  Weakness.  Not being able to move a part of the body (paralysis).  Muscle twitching.  Clumsiness or poor coordination.  Loss of balance.  Not being able to control your bladder.  Feeling dizzy.  Sexual problems.  How is this diagnosed? Peripheral neuropathy is a symptom, not a disease. Finding the cause of peripheral neuropathy can be hard. To figure that out, your health care provider will take a medical history and do a physical exam. A neurological exam will also be done. This involves checking things affected by your brain, spinal cord, and nerves (nervous system). For example, your health care provider will check your reflexes, how you move, and what you can feel. Other types of tests  may also be ordered, such as:  Blood tests.  A test of the fluid in your spinal cord.  Imaging tests, such as CT scans or an MRI.  Electromyography (EMG). This test checks the nerves that control muscles.  Nerve conduction velocity tests. These tests check how fast messages pass through your nerves.  Nerve biopsy. A small piece of nerve is removed. It is then checked under a microscope.  How is this treated?  Medicine is often used to treat peripheral neuropathy. Medicines may include: ? Pain-relieving medicines. Prescription or over-the-counter medicine may be suggested. ? Antiseizure medicine. This may be used for pain. ? Antidepressants. These also may help ease pain from neuropathy. ? Lidocaine. This is a numbing medicine. You might wear a patch or be given a shot. ? Mexiletine. This medicine is typically used to help control irregular heart rhythms.  Surgery. Surgery may be needed to relieve pressure on a nerve or to destroy a nerve that is causing pain.  Physical therapy to help movement.  Assistive devices to help movement. Follow these instructions at home:  Only take over-the-counter or prescription medicines as directed by your health care provider. Follow the instructions carefully for any given medicines. Do not take any other medicines without first getting approval from your health care provider.  If you have diabetes, work closely with your health care provider to keep your blood sugar under control.  If you have numbness in your feet: ? Check every day for signs of injury or infection. Watch for redness, warmth, and swelling. ? Wear padded socks and comfortable   shoes. These help protect your feet.  Do not do things that put pressure on your damaged nerve.  Do not smoke. Smoking keeps blood from getting to damaged nerves.  Avoid or limit alcohol. Too much alcohol can cause a lack of B vitamins. These vitamins are needed for healthy nerves.  Develop a good  support system. Coping with peripheral neuropathy can be stressful. Talk to a mental health specialist or join a support group if you are struggling.  Follow up with your health care provider as directed. Contact a health care provider if:  You have new signs or symptoms of peripheral neuropathy.  You are struggling emotionally from dealing with peripheral neuropathy.  You have a fever. Get help right away if:  You have an injury or infection that is not healing.  You feel very dizzy or begin vomiting.  You have chest pain.  You have trouble breathing. This information is not intended to replace advice given to you by your health care provider. Make sure you discuss any questions you have with your health care provider. Document Released: 04/09/2002 Document Revised: 09/25/2015 Document Reviewed: 12/25/2012 Elsevier Interactive Patient Education  2017 [employer]  

## 2016-11-16 NOTE — Progress Notes (Signed)
   Subjective:    [name], female    DOB: 1928/03/29, 83 y.o.   MRN: [id number]  HPI  Pt states that she wants to know about neuropathy, for her feet. Anything that can help with her nerves.     Review of Systems  HENT: Positive for hearing loss, sinus pain and sinus pressure.   Neurological: Positive for numbness.  Hematological: Bruises/bleeds easily.  All other systems reviewed and are negative.      Objective:   Physical Exam        Assessment & Plan:

## 2016-11-16 NOTE — Progress Notes (Signed)
Subjective: Misty Blackwell is a 83 y.o. female patient who presents to office for evaluation of bilateral foot pain. Patient complains of progressive pain of numbness to both feet, has Gabapentin BID and saw a neurologist in the past who never explained what neuropathy issue was. Patient states that she has ?s and what to know what can be done.   Patient Active Problem List   Diagnosis Date Noted  . Encounter for general adult medical examination with abnormal findings 12/23/2015  . Medicare annual wellness visit, subsequent 12/01/2014  . Prediabetes 08/15/2013  . Vitamin D deficiency 08/15/2013  . Medication management 08/15/2013  . Pacemaker 09/19/2012  . Atrial fibrillation ([place]) 07/18/2012  . Long term current use of anticoagulant therapy 07/18/2012  . Hyperlipidemia 09/03/2008  . Essential hypertension 09/03/2008  . Coronary atherosclerosis 09/03/2008  . GERD 09/03/2008  . FIBROCYSTIC BREAST DISEASE 09/03/2008  . Osteoarthritis 09/03/2008    Current Outpatient Prescriptions on File Prior to Visit  Medication Sig Dispense Refill  . ALPRAZolam (XANAX) 1 MG tablet take 1/2 to 1 tablet by mouth three times a day if needed for anxiety 90 tablet 0  . cholecalciferol (VITAMIN D) 1000 UNITS tablet Take 1,000 Units by mouth 3 (three) times daily.     Marland Kitchen diltiazem (TAZTIA XT) 240 MG 24 hr capsule Take 1 capsule (240 mg total) by mouth daily. 30 capsule 11  . furosemide (LASIX) 40 MG tablet Take 0.5 tablets (20 mg total) by mouth daily as needed. 30 tablet 2  . nortriptyline (PAMELOR) 10 MG capsule 1 pill up to 3 x a daily for neuropathy/numbness/pain in your feet 90 capsule 2  . potassium chloride SA (K-DUR,KLOR-CON) 20 MEQ tablet Take 1 tablet (20 mEq total) by mouth daily. 30 tablet 8  . warfarin (COUMADIN) 3 MG tablet TAKE 1 TO 1 AND A 1/2 TABLETS BY MOUTH AS DIRECTED 45 tablet 3   No current facility-administered medications on file prior to visit.     Allergies  Allergen Reactions   . Latex Itching  . Ace Inhibitors     Unknown   . Acrylic Polymer [Carbomer]   . Augmentin [Amoxicillin-Pot Clavulanate]   . Ciprofloxacin   . Levaquin [Levofloxacin In D5w]   . Zocor [Simvastatin]   . Chocolate Other (See Comments)    migraine's   . Gabapentin Other (See Comments)    Unsteady gait     Objective:  General: Alert and oriented x3 in no acute distress  Dermatology: No open lesions bilateral lower extremities, no webspace macerations, no ecchymosis bilateral, all nails x 10 are well manicured.  Vascular: Dorsalis Pedis and Posterior Tibial pedal pulses palpable 1/4, Capillary Fill Time 3 seconds,(-) pedal hair growth bilateral, no edema bilateral lower extremities, + significant varicosities. Temperature gradient within normal limits.  Neurology: Johney Maine sensation intact via light touch bilateral, Protective sensation intact  with Thornell Mule Monofilament to all pedal sites, Position sense intact, vibratory diminished bilateral, subjective numbness to both feet, (- )Tinels sign bilateral.   Musculoskeletal: No tenderness with palpation bilateral. Strength within normal limits in all groups bilateral.   Assessment and Plan: Problem List Items Addressed This Visit    None    Visit Diagnoses    Peripheral neuropathy, idiopathic    -  Primary   Relevant Medications   gabapentin (NEURONTIN) 300 MG capsule       -Complete examination performed -Discussed treatement options for neuropathy -Recommend continue with topicals and to further discuss with PCP possible medication/gabapentin change -  Continue with good supportive shoes -Patient to return to office as needed or sooner if condition worsens.  Landis Martins, DPM

## 2016-11-23 ENCOUNTER — Ambulatory Visit (INDEPENDENT_AMBULATORY_CARE_PROVIDER_SITE_OTHER): Payer: Medicare Other | Admitting: Pharmacist Clinician (PhC)/ Clinical Pharmacy Specialist

## 2016-11-23 DIAGNOSIS — I482 Chronic atrial fibrillation, unspecified: Secondary | ICD-10-CM

## 2016-11-23 DIAGNOSIS — Z7901 Long term (current) use of anticoagulants: Secondary | ICD-10-CM | POA: Diagnosis not present

## 2016-11-23 LAB — POCT INR: INR: 1.8

## 2016-11-29 ENCOUNTER — Other Ambulatory Visit: Payer: Self-pay | Admitting: *Deleted

## 2016-11-29 MED ORDER — ALPRAZOLAM 1 MG PO TABS: ORAL_TABLET | ORAL | 0 refills | Status: DC

## 2016-12-13 ENCOUNTER — Ambulatory Visit (INDEPENDENT_AMBULATORY_CARE_PROVIDER_SITE_OTHER): Payer: Medicare Other | Admitting: Pharmacist Clinician (PhC)/ Clinical Pharmacy Specialist

## 2016-12-13 DIAGNOSIS — Z7901 Long term (current) use of anticoagulants: Secondary | ICD-10-CM

## 2016-12-13 DIAGNOSIS — I482 Chronic atrial fibrillation, unspecified: Secondary | ICD-10-CM

## 2016-12-13 LAB — POCT INR: INR: 2.1

## 2017-01-06 ENCOUNTER — Encounter: Payer: Self-pay | Admitting: Internal Medicine

## 2017-01-06 NOTE — Progress Notes (Signed)
[facility] [name], M.D.      Uvaldo Bristle. Silverio Lay, P.A.-C [facility]                [address] [place] [address] Telephone [phone] Telefax [phone]  Annual Screening/Preventative Visit & Comprehensive Evaluation &  Examination     This very nice 83 y.o. [facility] presents for a Screening/Preventative Visit & comprehensive evaluation and management of multiple medical co-morbidities.  Patient has been followed for HTN, T2_NIDDM  Prediabetes, Hyperlipidemia and Vitamin D Deficiency.  She has GERD  controlled with prudent diet.       HTN predates since 1998. Patient's BP has been controlled at home and patient denies any cardiac symptoms as chest pain, palpitations, shortness of breath, dizziness or ankle swelling. Patient had Negative/Normal Heart Cath's x 2  in 1993/2007. Thein in 2003 she had a negative Myoview. In 2006 she had pAfib & an embolic TIA w/Rt hemifacial paresthesias. In 2007, she had a PPM placed for SSS.  Today's BP is at goal -  138/84.      Patient's hyperlipidemia is controlled with diet and medications. Patient denies myalgias or other medication SE's. Last lipids were at goal:  Lab Results  Component Value Date   CHOL 168 09/28/2016   HDL 42 (L) 09/28/2016   LDLCALC 92 09/28/2016   TRIG 172 (H) 09/28/2016   CHOLHDL 4.0 09/28/2016      Patient has prediabetes (A1c 5.9% / 2011 and elevated insulin 86)  and patient denies reactive hypoglycemic symptoms, visual blurring, diabetic polys, or paresthesias. Last A1c was at goal: Lab Results  Component Value Date   HGBA1C 5.5 12/23/2015      Finally, patient has history of Vitamin D Deficiency ("13" / 2008) and last Vitamin D was at goal: Lab Results  Component Value Date   VD25OH 53 09/28/2016   Current Outpatient Prescriptions on File Prior to Visit  Medication Sig  . ALPRAZolam (XANAX) 1 MG tablet take 1/2 to 1  tablet by mouth three times a day if needed for anxiety  . cholecalciferol (VITAMIN D) 1000 UNITS tablet Take 1,000 Units by mouth 3 (three) times daily.   Marland Kitchen diltiazem (TAZTIA XT) 240 MG 24 hr capsule Take 1 capsule (240 mg total) by mouth daily.  . furosemide (LASIX) 40 MG tablet Take 0.5 tablets (20 mg total) by mouth daily as needed.  . gabapentin (NEURONTIN) 300 MG capsule Take 300 mg by mouth 3 (three) times daily.  . potassium chloride SA (K-DUR,KLOR-CON) 20 MEQ tablet Take 1 tablet (20 mEq total) by mouth daily.  Marland Kitchen warfarin (COUMADIN) 3 MG tablet TAKE 1 TO 1 AND A 1/2 TABLETS BY MOUTH AS DIRECTED   No current facility-administered medications on file prior to visit.    Allergies  Allergen Reactions  . Latex Itching  . Ace Inhibitors     Unknown   . Acrylic Polymer [Carbomer]   . Augmentin [Amoxicillin-Pot Clavulanate]   . Ciprofloxacin   . Levaquin [Levofloxacin In D5w]   . Zocor [Simvastatin]   . Chocolate Other (See Comments)    migraine's   . Gabapentin Other (See Comments)    Unsteady gait    Past Medical History:  Diagnosis Date  . Atrial fib/flutter, transient   . Atrial fibrillation, chronic ([place])   . CHF (congestive heart failure) ([place]) 10/29/2009  Echo - EF >55%; normal LV size and systolic function; unable to assess diastolic fcn due to E/A fusion, pulmonary vein flow pattern suggests elevated filling pressure; marked biatrail dilation, mild/mod tricuspid regurgitation; mod pulmonary htn; mild/mod mitral regurgitation; although echocardiographic features are incomplete findings suggest possible infiltrative cardiomyopathy (maybe amyloidosi  . Coronary artery disease 03/19/2002   R/P Cardiolite - EF 76%; nromal static and dynamic myocardial perfusion images; normal wall motion and endocardial thickening in all vascular territories  . Facial numbness 12/26/2008   carotid doppler - R and L ICAs 0-49% diameter reduction (velocities suggest low end of scale)  .  Hypertension   . Pacemaker   . Peripheral neuropathy   . Skin cancer    s/p surgical removal.  . TIA (transient ischemic attack)    Health Maintenance  Topic Date Due  . INFLUENZA VACCINE  12/01/2016  . DEXA SCAN  06/03/2017 (Originally 08/21/1994)  . TETANUS/TDAP  03/18/2024  . PNA vac Low Risk Adult  Completed   Immunization History  Administered Date(s) Administered  . DT 03/19/2015  . Influenza Split 05/04/2011  . Influenza, High Dose Seasonal PF 03/19/2014, 12/23/2015  . Influenza,inj,quad, With Preservative 05/21/2013  . Influenza-Unspecified 02/04/2015  . Pneumococcal Conjugate-13 03/19/2014  . Pneumococcal Polysaccharide-23 05/04/2011  . Pneumococcal-Unspecified 05/03/2001  . Td 05/04/2003   Past Surgical History:  Procedure Laterality Date  . ABDOMINAL HYSTERECTOMY    . APPENDECTOMY    . CARDIAC CATHETERIZATION  08/06/2005   minimal coronary disease predominant RCA; no significant atherosclerosis; new onset sick sinus syndrome and atrial flutter w/ ventricular response, controlled on med therapy; systemic HTN, normal renal arteries  . CARDIOVERSION  11/19/2009   successful DCCV from AF to sinus type rhythm  . CHOLECYSTECTOMY    . Skin cancer resection     Family History  Problem Relation Age of Onset  . Heart disease Mother   . Diabetes Mother   . Heart attack Father   . Heart disease Father   . Cirrhosis Brother    Social History  Substance Use Topics  . Smoking status: Never Smoker  . Smokeless tobacco: Never Used  . Alcohol use No    ROS Constitutional: Denies fever, chills, weight loss/gain, headaches, insomnia,  night sweats, and change in appetite. Does c/o fatigue. Eyes: Denies redness, blurred vision, diplopia, discharge, itchy, watery eyes.  ENT: Denies discharge, congestion, post nasal drip, epistaxis, sore throat, earache, hearing loss, dental pain, Tinnitus, Vertigo, Sinus pain, snoring.  Cardio: Denies chest pain, palpitations, irregular  heartbeat, syncope, dyspnea, diaphoresis, orthopnea, PND, claudication, edema Respiratory: denies cough, dyspnea, DOE, pleurisy, hoarseness, laryngitis, wheezing.  Gastrointestinal: Denies dysphagia, heartburn, reflux, water brash, pain, cramps, nausea, vomiting, bloating, diarrhea, constipation, hematemesis, melena, hematochezia, jaundice, hemorrhoids Genitourinary: Denies dysuria, frequency, urgency, nocturia, hesitancy, discharge, hematuria, flank pain Breast: Breast lumps, nipple discharge, bleeding.  Musculoskeletal: Denies arthralgia, myalgia, stiffness, Jt. Swelling, pain, limp, and strain/sprain. Denies falls. Skin: Denies puritis, rash, hives, warts, acne, eczema, changing in skin lesion Neuro: No weakness, tremor, incoordination, spasms, paresthesia, pain Psychiatric: Denies confusion, memory loss, sensory loss. Denies Depression. Endocrine: Denies change in weight, skin, hair change, nocturia, and paresthesia, diabetic polys, visual blurring, hyper / hypo glycemic episodes.  Heme/Lymph: No excessive bleeding, bruising, enlarged lymph nodes.  Physical Exam  BP 138/84   Pulse 76   Temp (!) 97.3 F (36.3 C)   Resp 18   Ht 5\' 4"  (1.626 m)   Wt 151 lb 3.2 oz (68.6 kg)   BMI 25.95  kg/m   General Appearance: Well nourished, well groomed and in no apparent distress.  Eyes: PERRLA, EOMs, conjunctiva no swelling or erythema, normal fundi and vessels. Sinuses: No frontal/maxillary tenderness ENT/Mouth: EACs patent / TMs  nl. Nares clear without erythema, swelling, mucoid exudates. Oral hygiene is good. No erythema, swelling, or exudate. Tongue normal, non-obstructing. Tonsils not swollen or erythematous. Hearing normal.  Neck: Supple, thyroid normal. No bruits, nodes or JVD. Respiratory: Respiratory effort normal.  BS equal and clear bilateral without rales, rhonci, wheezing or stridor. Cardio: Heart sounds are normal with regular rate and rhythm and no murmurs, rubs or gallops.  Peripheral pulses are normal and equal bilaterally without edema. No aortic or femoral bruits. Chest: symmetric with normal excursions and percussion. Breasts: Symmetric, without lumps, nipple discharge, retractions, or fibrocystic changes.  Abdomen: Flat, soft with bowel sounds active. Nontender, no guarding, rebound, hernias, masses, or organomegaly.  Lymphatics: Non tender without lymphadenopathy.  Genitourinary:  Musculoskeletal: Full ROM all peripheral extremities, joint stability, 5/5 strength, and normal gait. Skin: Warm and dry without rashes, lesions, cyanosis, clubbing or  ecchymosis.  Neuro: Cranial nerves intact, reflexes equal bilaterally. Normal muscle tone, no cerebellar symptoms. Sensation intact.  Pysch: Alert and oriented X 3, normal affect, Insight and Judgment appropriate.   Assessment and Plan  1. Annual Preventative Screening Examination  2. Essential hypertension  - EKG 12-Lead - Microalbumin / creatinine urine ratio - Urinalysis w microscopic + reflex cultur - CBC with Differential/Platelet - BASIC METABOLIC PANEL WITH GFR - Magnesium - TSH  3. Hyperlipidemia, mixed  - EKG 12-Lead - Hepatic function panel - Lipid panel - TSH  4. Prediabetes  - EKG 12-Lead - Hemoglobin A1c - Insulin, fasting  5. Vitamin D deficiency  - VITAMIN D 25 Hydroxy   6. Gastroesophageal reflux disease  - CBC with Differential/Platelet  7. Screening for ischemic heart disease  - EKG 12-Lead - Lipid panel  8. Chronic atrial fibrillation (HCC)  - EKG 12-Lead - TSH  9. Pacemaker  - EKG 12-Lead  10. Screening for rectal cancer  - POC Hemoccult Bld/Stl  11. Depression, controlled   12. Medication management  - Microalbumin / creatinine urine ratio - Urinalysis w microscopic + reflex cultur - CBC with Differential/Platelet - BASIC METABOLIC PANEL WITH GFR - Hepatic function panel - Magnesium - Lipid panel - TSH - Hemoglobin A1c - Insulin, fasting -  VITAMIN D 25 Hydroxy         Patient was counseled in prudent diet to achieve/maintain BMI less than 25 for weight control, BP monitoring, regular exercise and medications. Discussed med's effects and SE's. Screening labs and tests as requested with regular follow-up as recommended. Over 40 minutes of exam, counseling, chart review and high complex critical decision making was performed.

## 2017-01-07 ENCOUNTER — Ambulatory Visit (INDEPENDENT_AMBULATORY_CARE_PROVIDER_SITE_OTHER): Payer: Medicare Other | Admitting: Internal Medicine

## 2017-01-07 ENCOUNTER — Encounter: Payer: Self-pay | Admitting: Internal Medicine

## 2017-01-07 VITALS — BP 138/84 | HR 76 | Temp 97.3°F | Resp 18 | Ht 64.0 in | Wt 151.2 lb

## 2017-01-07 DIAGNOSIS — Z79899 Other long term (current) drug therapy: Secondary | ICD-10-CM

## 2017-01-07 DIAGNOSIS — Z136 Encounter for screening for cardiovascular disorders: Secondary | ICD-10-CM | POA: Diagnosis not present

## 2017-01-07 DIAGNOSIS — I10 Essential (primary) hypertension: Secondary | ICD-10-CM

## 2017-01-07 DIAGNOSIS — K219 Gastro-esophageal reflux disease without esophagitis: Secondary | ICD-10-CM

## 2017-01-07 DIAGNOSIS — F329 Major depressive disorder, single episode, unspecified: Secondary | ICD-10-CM

## 2017-01-07 DIAGNOSIS — Z0000 Encounter for general adult medical examination without abnormal findings: Secondary | ICD-10-CM | POA: Diagnosis not present

## 2017-01-07 DIAGNOSIS — Z0001 Encounter for general adult medical examination with abnormal findings: Secondary | ICD-10-CM

## 2017-01-07 DIAGNOSIS — R7303 Prediabetes: Secondary | ICD-10-CM

## 2017-01-07 DIAGNOSIS — E559 Vitamin D deficiency, unspecified: Secondary | ICD-10-CM

## 2017-01-07 DIAGNOSIS — I482 Chronic atrial fibrillation, unspecified: Secondary | ICD-10-CM

## 2017-01-07 DIAGNOSIS — Z950 Presence of cardiac pacemaker: Secondary | ICD-10-CM

## 2017-01-07 DIAGNOSIS — E782 Mixed hyperlipidemia: Secondary | ICD-10-CM

## 2017-01-07 DIAGNOSIS — Z1212 Encounter for screening for malignant neoplasm of rectum: Secondary | ICD-10-CM

## 2017-01-07 DIAGNOSIS — F32A Depression, unspecified: Secondary | ICD-10-CM

## 2017-01-07 NOTE — Patient Instructions (Signed)

## 2017-01-10 ENCOUNTER — Other Ambulatory Visit: Payer: Self-pay | Admitting: Internal Medicine

## 2017-01-10 ENCOUNTER — Other Ambulatory Visit: Payer: Self-pay | Admitting: *Deleted

## 2017-01-10 ENCOUNTER — Telehealth: Payer: Self-pay | Admitting: *Deleted

## 2017-01-10 ENCOUNTER — Ambulatory Visit (INDEPENDENT_AMBULATORY_CARE_PROVIDER_SITE_OTHER): Payer: Medicare Other | Admitting: Pharmacist Clinician (PhC)/ Clinical Pharmacy Specialist

## 2017-01-10 DIAGNOSIS — Z7901 Long term (current) use of anticoagulants: Secondary | ICD-10-CM | POA: Diagnosis not present

## 2017-01-10 DIAGNOSIS — I482 Chronic atrial fibrillation, unspecified: Secondary | ICD-10-CM

## 2017-01-10 DIAGNOSIS — N390 Urinary tract infection, site not specified: Secondary | ICD-10-CM

## 2017-01-10 LAB — VITAMIN D 25 HYDROXY (VIT D DEFICIENCY, FRACTURES): Vit D, 25-Hydroxy: 50 ng/mL (ref 30–100)

## 2017-01-10 LAB — HEMOGLOBIN A1C
HEMOGLOBIN A1C: 5.5 %{Hb} (ref <5.7)
MEAN PLASMA GLUCOSE: 111 (calc)
eAG (mmol/L): 6.2 (calc)

## 2017-01-10 LAB — CBC WITH DIFFERENTIAL/PLATELET
BASOS ABS: 79 {cells}/uL (ref 0–200)
Basophils Relative: 1 %
Eosinophils Absolute: 111 cells/uL (ref 15–500)
Eosinophils Relative: 1.4 %
HCT: 42.4 % (ref 35.0–45.0)
Hemoglobin: 14.2 g/dL (ref 11.7–15.5)
LYMPHS ABS: 3539 {cells}/uL (ref 850–3900)
MCH: 30.5 pg (ref 27.0–33.0)
MCHC: 33.5 g/dL (ref 32.0–36.0)
MCV: 91.2 fL (ref 80.0–100.0)
MONOS PCT: 10 %
MPV: 11.1 fL (ref 7.5–12.5)
NEUTROS ABS: 3381 {cells}/uL (ref 1500–7800)
NEUTROS PCT: 42.8 %
Platelets: 220 10*3/uL (ref 140–400)
RBC: 4.65 10*6/uL (ref 3.80–5.10)
RDW: 12.2 % (ref 11.0–15.0)
Total Lymphocyte: 44.8 %
WBC mixed population: 790 cells/uL (ref 200–950)
WBC: 7.9 10*3/uL (ref 3.8–10.8)

## 2017-01-10 LAB — MICROALBUMIN / CREATININE URINE RATIO
Creatinine, Urine: 82 mg/dL (ref 20–320)
MICROALB/CREAT RATIO: 22 ug/mg{creat} (ref <30)
Microalb, Ur: 1.8 mg/dL

## 2017-01-10 LAB — URINE CULTURE
MICRO NUMBER: 80991163
SPECIMEN QUALITY: ADEQUATE

## 2017-01-10 LAB — BASIC METABOLIC PANEL WITH GFR
BUN: 16 mg/dL (ref 7–25)
CALCIUM: 9.5 mg/dL (ref 8.6–10.4)
CO2: 30 mmol/L (ref 20–32)
Chloride: 103 mmol/L (ref 98–110)
Creat: 0.79 mg/dL (ref 0.60–0.88)
GFR, Est African American: 78 mL/min/{1.73_m2} (ref >=60)
GFR, Est Non African American: 67 mL/min/{1.73_m2} (ref >=60)
GLUCOSE: 84 mg/dL (ref 65–99)
Potassium: 4.2 mmol/L (ref 3.5–5.3)
SODIUM: 141 mmol/L (ref 135–146)

## 2017-01-10 LAB — URINALYSIS W MICROSCOPIC + REFLEX CULTURE
BACTERIA UA: NONE SEEN /HPF
Bilirubin Urine: NEGATIVE
Glucose, UA: NEGATIVE
HGB URINE DIPSTICK: NEGATIVE
Hyaline Cast: NONE SEEN /LPF
KETONES UR: NEGATIVE
Nitrites, Initial: NEGATIVE
PH: 6.5 (ref 5.0–8.0)
PROTEIN: NEGATIVE
Specific Gravity, Urine: 1.016 (ref 1.001–1.03)

## 2017-01-10 LAB — HEPATIC FUNCTION PANEL
AG RATIO: 1.6 (calc) (ref 1.0–2.5)
ALBUMIN MSPROF: 4.2 g/dL (ref 3.6–5.1)
ALT: 14 U/L (ref 6–29)
AST: 16 U/L (ref 10–35)
Alkaline phosphatase (APISO): 103 U/L (ref 33–130)
BILIRUBIN DIRECT: 0.1 mg/dL (ref 0.0–0.2)
BILIRUBIN TOTAL: 0.5 mg/dL (ref 0.2–1.2)
Globulin: 2.6 g/dL (calc) (ref 1.9–3.7)
Indirect Bilirubin: 0.4 mg/dL (calc) (ref 0.2–1.2)
Total Protein: 6.8 g/dL (ref 6.1–8.1)

## 2017-01-10 LAB — LIPID PANEL
CHOL/HDL RATIO: 3.5 (calc) (ref <5.0)
Cholesterol: 161 mg/dL (ref <200)
HDL: 46 mg/dL — AB (ref >50)
LDL Cholesterol (Calc): 93 mg/dL (calc)
NON-HDL CHOLESTEROL (CALC): 115 mg/dL (ref <130)
TRIGLYCERIDES: 120 mg/dL (ref <150)

## 2017-01-10 LAB — MAGNESIUM: Magnesium: 2 mg/dL (ref 1.5–2.5)

## 2017-01-10 LAB — INSULIN, FASTING: Insulin: 8.2 u[IU]/mL (ref 2.0–19.6)

## 2017-01-10 LAB — SPECIMEN ID NOTIFICATION MISSING 2ND ID

## 2017-01-10 LAB — POCT INR: INR: 2.4

## 2017-01-10 LAB — TSH: TSH: 2.03 m[IU]/L (ref 0.40–4.50)

## 2017-01-10 LAB — CULTURE INDICATED

## 2017-01-10 MED ORDER — CIPROFLOXACIN HCL 250 MG PO TABS: ORAL_TABLET | ORAL | 0 refills | Status: DC

## 2017-01-10 MED ORDER — SULFAMETHOXAZOLE-TRIMETHOPRIM 800-160 MG PO TABS: ORAL_TABLET | ORAL | 0 refills | Status: AC

## 2017-01-10 NOTE — Telephone Encounter (Signed)
Patient was advised to cut her Coumadin dose to 3 mg 1 tablet 4 days a week and 1/2 tablet 3 days a week, while on her antibiotic.

## 2017-01-17 ENCOUNTER — Other Ambulatory Visit: Payer: Self-pay | Admitting: Internal Medicine

## 2017-01-17 NOTE — Telephone Encounter (Signed)
Please call Alpraz  

## 2017-01-19 ENCOUNTER — Ambulatory Visit (INDEPENDENT_AMBULATORY_CARE_PROVIDER_SITE_OTHER): Payer: Medicare Other | Admitting: *Deleted

## 2017-01-19 DIAGNOSIS — I482 Chronic atrial fibrillation, unspecified: Secondary | ICD-10-CM

## 2017-01-19 NOTE — Progress Notes (Signed)
Remote pacemaker transmission.   

## 2017-01-20 LAB — CUP PACEART REMOTE DEVICE CHECK
Implantable Lead Implant Date: 20110428
Implantable Lead Implant Date: 20110428
Implantable Lead Location: 753860
Implantable Lead Model: 4592
Implantable Pulse Generator Implant Date: 20110428
Lead Channel Impedance Value: 67 Ohm
Lead Channel Impedance Value: 748 Ohm
Lead Channel Pacing Threshold Amplitude: 0.5 V
Lead Channel Pacing Threshold Pulse Width: 0.4 ms
MDC IDC LEAD LOCATION: 753859
MDC IDC MSMT BATTERY IMPEDANCE: 1265 Ohm
MDC IDC MSMT BATTERY REMAINING LONGEVITY: 51 mo
MDC IDC MSMT BATTERY VOLTAGE: 2.78 V
MDC IDC SESS DTM: 20180919123131
MDC IDC SET LEADCHNL RV PACING AMPLITUDE: 2.5 V
MDC IDC SET LEADCHNL RV PACING PULSEWIDTH: 0.4 ms
MDC IDC SET LEADCHNL RV SENSING SENSITIVITY: 5.6 mV
MDC IDC STAT BRADY RV PERCENT PACED: 77 %

## 2017-01-21 ENCOUNTER — Encounter: Payer: Self-pay | Admitting: Cardiology

## 2017-02-07 ENCOUNTER — Ambulatory Visit (INDEPENDENT_AMBULATORY_CARE_PROVIDER_SITE_OTHER): Payer: Medicare Other | Admitting: Pharmacist Clinician (PhC)/ Clinical Pharmacy Specialist

## 2017-02-07 DIAGNOSIS — I482 Chronic atrial fibrillation, unspecified: Secondary | ICD-10-CM

## 2017-02-07 DIAGNOSIS — Z7901 Long term (current) use of anticoagulants: Secondary | ICD-10-CM

## 2017-02-07 LAB — POCT INR: INR: 2.6

## 2017-02-16 ENCOUNTER — Other Ambulatory Visit: Payer: Self-pay

## 2017-02-16 DIAGNOSIS — Z1212 Encounter for screening for malignant neoplasm of rectum: Secondary | ICD-10-CM

## 2017-02-16 LAB — POC HEMOCCULT BLD/STL (HOME/3-CARD/SCREEN)
Card #3 Fecal Occult Blood, POC: NEGATIVE
FECAL OCCULT BLD: NEGATIVE
FECAL OCCULT BLD: NEGATIVE

## 2017-02-22 ENCOUNTER — Other Ambulatory Visit: Payer: Self-pay | Admitting: Pharmacist Clinician (PhC)/ Clinical Pharmacy Specialist

## 2017-02-22 MED ORDER — WARFARIN SODIUM 3 MG PO TABS: ORAL_TABLET | ORAL | 1 refills | Status: DC

## 2017-04-02 DIAGNOSIS — I639 Cerebral infarction, unspecified: Secondary | ICD-10-CM

## 2017-04-02 HISTORY — DX: Cerebral infarction, unspecified: I63.9

## 2017-04-06 ENCOUNTER — Ambulatory Visit (INDEPENDENT_AMBULATORY_CARE_PROVIDER_SITE_OTHER): Payer: Medicare Other | Admitting: Pharmacist Clinician (PhC)/ Clinical Pharmacy Specialist

## 2017-04-06 DIAGNOSIS — I482 Chronic atrial fibrillation, unspecified: Secondary | ICD-10-CM

## 2017-04-06 DIAGNOSIS — Z7901 Long term (current) use of anticoagulants: Secondary | ICD-10-CM | POA: Diagnosis not present

## 2017-04-06 LAB — POCT INR: INR: 3.1

## 2017-04-12 ENCOUNTER — Ambulatory Visit: Payer: Self-pay | Admitting: Adult Health

## 2017-04-15 ENCOUNTER — Ambulatory Visit: Payer: Self-pay | Admitting: Adult Health

## 2017-04-18 ENCOUNTER — Other Ambulatory Visit: Payer: Self-pay | Admitting: Cardiovascular Disease

## 2017-04-18 ENCOUNTER — Other Ambulatory Visit: Payer: Self-pay | Admitting: Internal Medicine

## 2017-04-18 NOTE — Progress Notes (Signed)
FOLLOW UP  Assessment and Plan:   Atrial Fibrillation No concerns with excessive bleeding, no falls Continue medication: coumadin Continue follow up with a. Fib clinic Reminded to present to ER for any falls/head injury or concerning excessive bleeding  Hypertension Well controlled with current medications Monitor blood pressure at home; patient to call if consistently greater than 130/80 Continue DASH diet.   Reminder to go to the ER if any CP, SOB, nausea, dizziness, severe HA, changes vision/speech, left arm numbness and tingling and jaw pain.  Cholesterol Currently well controlled by lifestyle Continue low cholesterol diet and exercise.  Check lipid panel.   Prediabetes Discussed disease and risks Discussed diet/exercise, weight management  A1C  Vitamin D Def/ osteoporosis prevention Continue supplementation Check Vit D level  Continue diet and meds as discussed. Further disposition pending results of labs. Discussed med's effects and SE's.   Over 30 minutes of exam, counseling, chart review, and critical decision making was performed.   Future Appointments  Date Time Provider [place]  04/20/2017  8:55 AM [facility] DEVICE REMOTES [facility]  05/04/2017  9:15 AM [facility]  07/25/2017  9:30 AM [name], MD [facility] None  02/09/2018 10:00 AM [name], MD [facility] None    ----------------------------------------------------------------------------------------------------------------------  HPI 83 y.o. female  presents for 3 month follow up on hypertension, cholesterol, well controlled prediabetes and vitamin D deficiency. She has a diagnosis of chronic A. Fib treated by coumadin which is managed at the a. Fib clinic.   BMI is Body mass index is 25.58 kg/m., she has been working on diet and exercise. Wt Readings from Last 3 Encounters:  04/19/17 149 lb (67.6 kg)  01/07/17 151 lb 3.2 oz  (68.6 kg)  09/28/16 150 lb 9.6 oz (68.3 kg)   Her blood pressure has been controlled at home, today their BP is BP: 130/78  She does workout. She denies chest pain, shortness of breath, dizziness.   She is not on cholesterol medication and denies myalgias. Her cholesterol is at goal. The cholesterol last visit was:   Lab Results  Component Value Date   CHOL 161 01/07/2017   HDL 46 (L) 01/07/2017   LDLCALC 92 09/28/2016   TRIG 120 01/07/2017   CHOLHDL 3.5 01/07/2017    She has been working on diet and exercise for prediabetes, and denies increased appetite, nausea, polydipsia, polyuria and visual disturbances. Improvements to A1C have been noted at last 2 visits with normalized A1Cs. Last A1C in the office was:  Lab Results  Component Value Date   HGBA1C 5.5 01/07/2017   Patient is on Vitamin D supplement but remained below goal at the last visit:    Lab Results  Component Value Date   VD25OH 50 01/07/2017        Current Medications:  Current Outpatient Medications on File Prior to Visit  Medication Sig  . ALPRAZolam (XANAX) 1 MG tablet Take 1/2 to 1 tablet 2 to 3 x / day ONLY if needed for Acute Anxiety Attack and please try to limit to 5 days / week to avoid addiction  . cholecalciferol (VITAMIN D) 1000 UNITS tablet Take 1,000 Units by mouth 3 (three) times daily.   Marland Kitchen diltiazem (TAZTIA XT) 240 MG 24 hr capsule Take 1 capsule (240 mg total) by mouth daily.  . furosemide (LASIX) 40 MG tablet Take 0.5 tablets (20 mg total) by mouth daily as needed.  . potassium chloride SA (K-DUR,KLOR-CON) 20 MEQ tablet Take 1 tablet (20  mEq total) by mouth daily.  Marland Kitchen warfarin (COUMADIN) 3 MG tablet TAKE 1 TO 1 AND 1/2 TABLET BY MOUTH TABLETS AS DIRECTED  . gabapentin (NEURONTIN) 300 MG capsule Take 300 mg by mouth 3 (three) times daily.   No current facility-administered medications on file prior to visit.      Allergies:  Allergies  Allergen Reactions  . Latex Itching  . Ace Inhibitors      Unknown   . Acrylic Polymer [Carbomer]   . Augmentin [Amoxicillin-Pot Clavulanate]   . Ciprofloxacin   . Levaquin [Levofloxacin In D5w]   . Zocor [Simvastatin]   . Chocolate Other (See Comments)    migraine's   . Gabapentin Other (See Comments)    Unsteady gait      Medical History:  Past Medical History:  Diagnosis Date  . Atrial fib/flutter, transient   . Atrial fibrillation, chronic ([place])   . CHF (congestive heart failure) ([place]) 10/29/2009   Echo - EF >55%; normal LV size and systolic function; unable to assess diastolic fcn due to E/A fusion, pulmonary vein flow pattern suggests elevated filling pressure; marked biatrail dilation, mild/mod tricuspid regurgitation; mod pulmonary htn; mild/mod mitral regurgitation; although echocardiographic features are incomplete findings suggest possible infiltrative cardiomyopathy (maybe amyloidosi  . Coronary artery disease 03/19/2002   R/P Cardiolite - EF 76%; nromal static and dynamic myocardial perfusion images; normal wall motion and endocardial thickening in all vascular territories  . Facial numbness 12/26/2008   carotid doppler - R and L ICAs 0-49% diameter reduction (velocities suggest low end of scale)  . Hypertension   . Pacemaker   . Peripheral neuropathy   . Skin cancer    s/p surgical removal.  . TIA (transient ischemic attack)    Family history- Reviewed and unchanged Social history- Reviewed and unchanged   Review of Systems:  Review of Systems  Constitutional: Negative for malaise/fatigue and weight loss.  HENT: Negative for hearing loss and tinnitus.   Eyes: Negative for blurred vision and double vision.  Respiratory: Negative for cough, shortness of breath and wheezing.   Cardiovascular: Negative for chest pain, palpitations, orthopnea, claudication and leg swelling.  Gastrointestinal: Negative for abdominal pain, blood in stool, constipation, diarrhea, heartburn, melena, nausea and vomiting.  Genitourinary:  Negative.        Intermittent urge incontinence  Musculoskeletal: Negative for joint pain and myalgias.  Skin: Negative for rash.  Neurological: Negative for dizziness, tingling, sensory change, weakness and headaches.  Endo/Heme/Allergies: Negative for polydipsia.  Psychiatric/Behavioral: Negative.   All other systems reviewed and are negative.   Physical Exam: BP 130/78   Pulse 77   Temp (!) 97.3 F (36.3 C)   Ht 5\' 4"  (1.626 m)   Wt 149 lb (67.6 kg)   SpO2 97%   BMI 25.58 kg/m  Wt Readings from Last 3 Encounters:  04/19/17 149 lb (67.6 kg)  01/07/17 151 lb 3.2 oz (68.6 kg)  09/28/16 150 lb 9.6 oz (68.3 kg)   General Appearance: Well nourished, in no apparent distress. Eyes: PERRLA, EOMs, conjunctiva no swelling or erythema Sinuses: No Frontal/maxillary tenderness ENT/Mouth: Ext aud canals clear, TMs without erythema, bulging. No erythema, swelling, or exudate on post pharynx.  Tonsils not swollen or erythematous. Hearing normal.  Neck: Supple, thyroid normal.  Respiratory: Respiratory effort normal, BS equal bilaterally without rales, rhonchi, wheezing or stridor.  Cardio: Irregularly irregular. Brisk peripheral pulses without edema.  Abdomen: Soft, + BS.  Non tender, no guarding, rebound, hernias, masses. Lymphatics: Non tender  without lymphadenopathy.  Musculoskeletal: Full ROM, 5/5 strength, Normal gait Skin: Warm, dry without rashes, lesions, ecchymosis.  Neuro: Cranial nerves intact. No cerebellar symptoms.  Psych: Awake and oriented X 3, normal affect, Insight and Judgment appropriate.    Izora Ribas, NP 9:37 AM [facility]

## 2017-04-19 ENCOUNTER — Encounter: Payer: Self-pay | Admitting: Adult Health

## 2017-04-19 ENCOUNTER — Ambulatory Visit (INDEPENDENT_AMBULATORY_CARE_PROVIDER_SITE_OTHER): Payer: Medicare Other | Admitting: Adult Health

## 2017-04-19 VITALS — BP 130/78 | HR 77 | Temp 97.3°F | Ht 64.0 in | Wt 149.0 lb

## 2017-04-19 DIAGNOSIS — I10 Essential (primary) hypertension: Secondary | ICD-10-CM

## 2017-04-19 DIAGNOSIS — E559 Vitamin D deficiency, unspecified: Secondary | ICD-10-CM | POA: Diagnosis not present

## 2017-04-19 DIAGNOSIS — R7303 Prediabetes: Secondary | ICD-10-CM

## 2017-04-19 DIAGNOSIS — E782 Mixed hyperlipidemia: Secondary | ICD-10-CM | POA: Diagnosis not present

## 2017-04-19 DIAGNOSIS — Z79899 Other long term (current) drug therapy: Secondary | ICD-10-CM | POA: Diagnosis not present

## 2017-04-19 DIAGNOSIS — I482 Chronic atrial fibrillation, unspecified: Secondary | ICD-10-CM

## 2017-04-19 NOTE — Patient Instructions (Signed)
Urinary Incontinence Urinary incontinence is the involuntary loss of urine from your bladder. What are the causes? There are many causes of urinary incontinence. They include:  Medicines.  Infections.  Prostatic enlargement, leading to overflow of urine from your bladder.  Surgery.  Neurological diseases.  Emotional factors.  What are the signs or symptoms? Urinary Incontinence can be divided into four types: 1. Urge incontinence. Urge incontinence is the involuntary loss of urine before you have the opportunity to go to the bathroom. There is a sudden urge to void but not enough time to reach a bathroom. 2. Stress incontinence. Stress incontinence is the sudden loss of urine with any activity that forces urine to pass. It is commonly caused by anatomical changes to the pelvis and sphincter areas of your body. 3. Overflow incontinence. Overflow incontinence is the loss of urine from an obstructed opening to your bladder. This results in a backup of urine and a resultant buildup of pressure within the bladder. When the pressure within the bladder exceeds the closing pressure of the sphincter, the urine overflows, which causes incontinence, similar to water overflowing a dam. 4. Total incontinence. Total incontinence is the loss of urine as a result of the inability to store urine within your bladder.  How is this diagnosed? Evaluating the cause of incontinence may require:  A thorough and complete medical and obstetric history.  A complete physical exam.  Laboratory tests such as a urine culture and sensitivities.  When additional tests are indicated, they can include:  An ultrasound exam.  Kidney and bladder X-rays.  Cystoscopy. This is an exam of the bladder using a narrow scope.  Urodynamic testing to test the nerve function to the bladder and sphincter areas.  How is this treated? Treatment for urinary incontinence depends on the cause:  For urge incontinence caused  by a bacterial infection, antibiotics will be prescribed. If the urge incontinence is related to medicines you take, your health care provider may have you change the medicine.  For stress incontinence, surgery to re-establish anatomical support to the bladder or sphincter, or both, will often correct the condition.  For overflow incontinence caused by an enlarged prostate, an operation to open the channel through the enlarged prostate will allow the flow of urine out of the bladder. In women with fibroids, a hysterectomy may be recommended.  For total incontinence, surgery on your urinary sphincter may help. An artificial urinary sphincter (an inflatable cuff placed around the urethra) may be required. In women who have developed a hole-like passage between their bladder and vagina (vesicovaginal fistula), surgery to close the fistula often is required.  Follow these instructions at home:  Normal daily hygiene and the use of pads or adult diapers that are changed regularly will help prevent odors and skin damage.  Avoid caffeine. It can overstimulate your bladder.  Use the bathroom regularly. Try about every 2-3 hours to go to the bathroom, even if you do not feel the need to do so. Take time to empty your bladder completely. After urinating, wait a minute. Then try to urinate again.  For causes involving nerve dysfunction, keep a log of the medicines you take and a journal of the times you go to the bathroom. Contact a health care provider if:  You experience worsening of pain instead of improvement in pain after your procedure.  Your incontinence becomes worse instead of better. Get help right away if:  You experience fever or shaking chills.  You are unable to   pass your urine.  You have redness spreading into your groin or down into your thighs. This information is not intended to replace advice given to you by your health care provider. Make sure you discuss any questions you have  with your health care provider. Document Released: 05/27/2004 Document Revised: 11/28/2015 Document Reviewed: 09/26/2012 Elsevier Interactive Patient Education  2018 [employer]  

## 2017-04-20 ENCOUNTER — Ambulatory Visit (INDEPENDENT_AMBULATORY_CARE_PROVIDER_SITE_OTHER): Payer: Medicare Other | Admitting: *Deleted

## 2017-04-20 DIAGNOSIS — I482 Chronic atrial fibrillation, unspecified: Secondary | ICD-10-CM

## 2017-04-20 LAB — CBC WITH DIFFERENTIAL/PLATELET
BASOS ABS: 68 {cells}/uL (ref 0–200)
Basophils Relative: 0.9 %
EOS PCT: 1.2 %
Eosinophils Absolute: 91 cells/uL (ref 15–500)
HEMATOCRIT: 43.5 % (ref 35.0–45.0)
Hemoglobin: 14.5 g/dL (ref 11.7–15.5)
Lymphs Abs: 2538 cells/uL (ref 850–3900)
MCH: 30.6 pg (ref 27.0–33.0)
MCHC: 33.3 g/dL (ref 32.0–36.0)
MCV: 91.8 fL (ref 80.0–100.0)
MPV: 10.9 fL (ref 7.5–12.5)
Monocytes Relative: 9.5 %
NEUTROS PCT: 55 %
Neutro Abs: 4180 cells/uL (ref 1500–7800)
PLATELETS: 246 10*3/uL (ref 140–400)
RBC: 4.74 10*6/uL (ref 3.80–5.10)
RDW: 11.9 % (ref 11.0–15.0)
TOTAL LYMPHOCYTE: 33.4 %
WBC mixed population: 722 cells/uL (ref 200–950)
WBC: 7.6 10*3/uL (ref 3.8–10.8)

## 2017-04-20 LAB — HEPATIC FUNCTION PANEL
AG Ratio: 1.6 (calc) (ref 1.0–2.5)
ALBUMIN MSPROF: 4.3 g/dL (ref 3.6–5.1)
ALT: 16 U/L (ref 6–29)
AST: 19 U/L (ref 10–35)
Alkaline phosphatase (APISO): 121 U/L (ref 33–130)
BILIRUBIN DIRECT: 0.1 mg/dL (ref 0.0–0.2)
BILIRUBIN INDIRECT: 0.6 mg/dL (ref 0.2–1.2)
GLOBULIN: 2.7 g/dL (ref 1.9–3.7)
TOTAL PROTEIN: 7 g/dL (ref 6.1–8.1)
Total Bilirubin: 0.7 mg/dL (ref 0.2–1.2)

## 2017-04-20 LAB — HEMOGLOBIN A1C
EAG (MMOL/L): 6.2 (calc)
Hgb A1c MFr Bld: 5.5 % of total Hgb (ref <5.7)
MEAN PLASMA GLUCOSE: 111 (calc)

## 2017-04-20 LAB — BASIC METABOLIC PANEL WITH GFR
BUN: 16 mg/dL (ref 7–25)
CALCIUM: 9.7 mg/dL (ref 8.6–10.4)
CHLORIDE: 102 mmol/L (ref 98–110)
CO2: 34 mmol/L — ABNORMAL HIGH (ref 20–32)
Creat: 0.66 mg/dL (ref 0.60–0.88)
GFR, EST AFRICAN AMERICAN: 92 mL/min/{1.73_m2} (ref >=60)
GFR, Est Non African American: 79 mL/min/{1.73_m2} (ref >=60)
GLUCOSE: 86 mg/dL (ref 65–99)
POTASSIUM: 5 mmol/L (ref 3.5–5.3)
Sodium: 142 mmol/L (ref 135–146)

## 2017-04-20 LAB — LIPID PANEL
CHOL/HDL RATIO: 3.7 (calc) (ref <5.0)
CHOLESTEROL: 183 mg/dL (ref <200)
HDL: 49 mg/dL — AB (ref >50)
LDL Cholesterol (Calc): 112 mg/dL (calc) — ABNORMAL HIGH
Non-HDL Cholesterol (Calc): 134 mg/dL (calc) — ABNORMAL HIGH (ref <130)
Triglycerides: 109 mg/dL (ref <150)

## 2017-04-20 LAB — VITAMIN D 25 HYDROXY (VIT D DEFICIENCY, FRACTURES): Vit D, 25-Hydroxy: 63 ng/mL (ref 30–100)

## 2017-04-20 LAB — TSH: TSH: 1.43 m[IU]/L (ref 0.40–4.50)

## 2017-04-20 NOTE — Progress Notes (Signed)
Remote pacemaker transmission.   

## 2017-04-21 ENCOUNTER — Encounter: Payer: Self-pay | Admitting: Cardiology

## 2017-05-04 ENCOUNTER — Ambulatory Visit (INDEPENDENT_AMBULATORY_CARE_PROVIDER_SITE_OTHER): Payer: Medicare Other | Admitting: Pharmacist

## 2017-05-04 DIAGNOSIS — Z7901 Long term (current) use of anticoagulants: Secondary | ICD-10-CM

## 2017-05-04 DIAGNOSIS — I482 Chronic atrial fibrillation, unspecified: Secondary | ICD-10-CM

## 2017-05-04 LAB — CUP PACEART REMOTE DEVICE CHECK
Battery Remaining Longevity: 48 mo
Battery Voltage: 2.77 V
Brady Statistic RV Percent Paced: 77 %
Date Time Interrogation Session: 20181218185543
Implantable Lead Implant Date: 20110428
Implantable Lead Location: 753859
Implantable Lead Location: 753860
Implantable Lead Model: 4092
Lead Channel Impedance Value: 662 Ohm
Lead Channel Setting Pacing Pulse Width: 0.4 ms
Lead Channel Setting Sensing Sensitivity: 4 mV
MDC IDC LEAD IMPLANT DT: 20110428
MDC IDC MSMT BATTERY IMPEDANCE: 1345 Ohm
MDC IDC MSMT LEADCHNL RA IMPEDANCE VALUE: 67 Ohm
MDC IDC MSMT LEADCHNL RV PACING THRESHOLD AMPLITUDE: 0.625 V
MDC IDC MSMT LEADCHNL RV PACING THRESHOLD PULSEWIDTH: 0.4 ms
MDC IDC PG IMPLANT DT: 20110428
MDC IDC SET LEADCHNL RV PACING AMPLITUDE: 2.5 V

## 2017-05-04 LAB — POCT INR: INR: 2.2

## 2017-06-08 ENCOUNTER — Ambulatory Visit (INDEPENDENT_AMBULATORY_CARE_PROVIDER_SITE_OTHER): Payer: Medicare Other | Admitting: Pharmacist

## 2017-06-08 DIAGNOSIS — I482 Chronic atrial fibrillation, unspecified: Secondary | ICD-10-CM

## 2017-06-08 DIAGNOSIS — Z7901 Long term (current) use of anticoagulants: Secondary | ICD-10-CM

## 2017-06-08 LAB — POCT INR: INR: 2.3

## 2017-06-20 ENCOUNTER — Other Ambulatory Visit: Payer: Self-pay | Admitting: Internal Medicine

## 2017-07-20 ENCOUNTER — Ambulatory Visit (INDEPENDENT_AMBULATORY_CARE_PROVIDER_SITE_OTHER): Payer: Medicare Other | Admitting: Pharmacist

## 2017-07-20 ENCOUNTER — Encounter: Payer: Self-pay | Admitting: Cardiovascular Disease

## 2017-07-20 ENCOUNTER — Ambulatory Visit (INDEPENDENT_AMBULATORY_CARE_PROVIDER_SITE_OTHER): Payer: Medicare Other | Admitting: *Deleted

## 2017-07-20 ENCOUNTER — Ambulatory Visit (INDEPENDENT_AMBULATORY_CARE_PROVIDER_SITE_OTHER): Payer: Medicare Other | Admitting: Cardiovascular Disease

## 2017-07-20 VITALS — BP 132/66 | HR 70 | Ht 64.5 in | Wt 151.8 lb

## 2017-07-20 DIAGNOSIS — Z7901 Long term (current) use of anticoagulants: Secondary | ICD-10-CM

## 2017-07-20 DIAGNOSIS — I482 Chronic atrial fibrillation, unspecified: Secondary | ICD-10-CM

## 2017-07-20 DIAGNOSIS — R072 Precordial pain: Secondary | ICD-10-CM

## 2017-07-20 DIAGNOSIS — I5033 Acute on chronic diastolic (congestive) heart failure: Secondary | ICD-10-CM | POA: Diagnosis not present

## 2017-07-20 DIAGNOSIS — Z950 Presence of cardiac pacemaker: Secondary | ICD-10-CM

## 2017-07-20 DIAGNOSIS — I4821 Permanent atrial fibrillation: Secondary | ICD-10-CM

## 2017-07-20 DIAGNOSIS — E782 Mixed hyperlipidemia: Secondary | ICD-10-CM

## 2017-07-20 DIAGNOSIS — I25119 Atherosclerotic heart disease of native coronary artery with unspecified angina pectoris: Secondary | ICD-10-CM | POA: Diagnosis not present

## 2017-07-20 DIAGNOSIS — R0602 Shortness of breath: Secondary | ICD-10-CM | POA: Diagnosis not present

## 2017-07-20 DIAGNOSIS — I10 Essential (primary) hypertension: Secondary | ICD-10-CM

## 2017-07-20 LAB — POCT INR: INR: 3

## 2017-07-20 MED ORDER — POTASSIUM CHLORIDE ER 10 MEQ PO TBCR: 10.0000 meq | EXTENDED_RELEASE_TABLET | Freq: Every day | ORAL | 3 refills | Status: DC

## 2017-07-20 MED ORDER — NITROGLYCERIN 0.4 MG SL SUBL: 0.4000 mg | SUBLINGUAL_TABLET | SUBLINGUAL | 3 refills | Status: DC | PRN

## 2017-07-20 MED ORDER — FUROSEMIDE 40 MG PO TABS: 40.0000 mg | ORAL_TABLET | Freq: Every day | ORAL | 3 refills | Status: DC

## 2017-07-20 NOTE — Progress Notes (Signed)
Remote pacemaker transmission.   

## 2017-07-20 NOTE — Patient Instructions (Signed)
Medication Instructions: Dr Sallyanne Kuster has recommended making the following medication changes: 1. INCREASE Furosemide to 40 mg DAILY 2. START Potassium 10 mEq daily 3. USE Nitroglycerin as needed  Labwork: NONE ORDERED  Testing/Procedures: 1. Echocardiogram - Your physician has requested that you have an echocardiogram. Echocardiography is a painless test that uses sound waves to create images of your heart. It provides your doctor with information about the size and shape of your heart and how well your heart's chambers and valves are working. This procedure takes approximately one hour. There are no restrictions for this procedure.  2. Leane Call Stress Test - Your physician has requested that you have a lexiscan myoview. For further information please visit [email or web address]. Please follow instruction sheet, as given.  >>These tests will be performed at our [facility] location [place], [address]  Follow-up: Dr Sallyanne Kuster, next Tuesday 07/26/17 at 12:00p.  If you need a refill on your cardiac medications before your next appointment, please call your pharmacy.   Nitroglycerin sublingual tablets What is this medicine? NITROGLYCERIN (nye troe GLI ser in) is a type of vasodilator. It relaxes blood vessels, increasing the blood and oxygen supply to your heart. This medicine is used to relieve chest pain caused by angina. It is also used to prevent chest pain before activities like climbing stairs, going outdoors in cold weather, or sexual activity. This medicine may be used for other purposes; ask your health care provider or pharmacist if you have questions. COMMON BRAND NAME(S): Nitroquick, Nitrostat, Nitrotab What should I tell my health care provider before I take this medicine? They need to know if you have any of these conditions: -anemia -head injury, recent stroke, or bleeding in the brain -liver disease -previous heart attack -an unusual or allergic reaction to  nitroglycerin, other medicines, foods, dyes, or preservatives -pregnant or trying to get pregnant -breast-feeding How should I use this medicine? Take this medicine by mouth as needed. At the first sign of an angina attack (chest pain or tightness) place one tablet under your tongue. You can also take this medicine 5 to 10 minutes before an event likely to produce chest pain. Follow the directions on the prescription label. Let the tablet dissolve under the tongue. Do not swallow whole. Replace the dose if you accidentally swallow it. It will help if your mouth is not dry. Saliva around the tablet will help it to dissolve more quickly. Do not eat or drink, smoke or chew tobacco while a tablet is dissolving. If you are not better within 5 minutes after taking ONE dose of nitroglycerin, call 9-1-1 immediately to seek emergency medical care. Do not take more than 3 nitroglycerin tablets over 15 minutes. If you take this medicine often to relieve symptoms of angina, your doctor or health care professional may provide you with different instructions to manage your symptoms. If symptoms do not go away after following these instructions, it is important to call 9-1-1 immediately. Do not take more than 3 nitroglycerin tablets over 15 minutes. Talk to your pediatrician regarding the use of this medicine in children. Special care may be needed. Overdosage: If you think you have taken too much of this medicine contact a poison control center or emergency room at once. NOTE: This medicine is only for you. Do not share this medicine with others. What if I miss a dose? This does not apply. This medicine is only used as needed. What may interact with this medicine? Do not take this medicine  with any of the following medications: -certain migraine medicines like ergotamine and dihydroergotamine (DHE) -medicines used to treat erectile dysfunction like sildenafil, tadalafil, and vardenafil -riociguat This medicine may  also interact with the following medications: -alteplase -aspirin -heparin -medicines for high blood pressure -medicines for mental depression -other medicines used to treat angina -phenothiazines like chlorpromazine, mesoridazine, prochlorperazine, thioridazine This list may not describe all possible interactions. Give your health care provider a list of all the medicines, herbs, non-prescription drugs, or dietary supplements you use. Also tell them if you smoke, drink alcohol, or use illegal drugs. Some items may interact with your medicine. What should I watch for while using this medicine? Tell your doctor or health care professional if you feel your medicine is no longer working. Keep this medicine with you at all times. Sit or lie down when you take your medicine to prevent falling if you feel dizzy or faint after using it. Try to remain calm. This will help you to feel better faster. If you feel dizzy, take several deep breaths and lie down with your feet propped up, or bend forward with your head resting between your knees. You may get drowsy or dizzy. Do not drive, use machinery, or do anything that needs mental alertness until you know how this drug affects you. Do not stand or sit up quickly, especially if you are an older patient. This reduces the risk of dizzy or fainting spells. Alcohol can make you more drowsy and dizzy. Avoid alcoholic drinks. Do not treat yourself for coughs, colds, or pain while you are taking this medicine without asking your doctor or health care professional for advice. Some ingredients may increase your blood pressure. What side effects may I notice from receiving this medicine? Side effects that you should report to your doctor or health care professional as soon as possible: -blurred vision -dry mouth -skin rash -sweating -the feeling of extreme pressure in the head -unusually weak or tired Side effects that usually do not require medical attention  (report to your doctor or health care professional if they continue or are bothersome): -flushing of the face or neck -headache -irregular heartbeat, palpitations -nausea, vomiting This list may not describe all possible side effects. Call your doctor for medical advice about side effects. You may report side effects to FDA at 1-800-FDA-1088. Where should I keep my medicine? Keep out of the reach of children. Store at room temperature between 20 and 25 degrees C (68 and 77 degrees F). Store in [employer]. Protect from light and moisture. Keep tightly closed. Throw away any unused medicine after the expiration date. NOTE: This sheet is a summary. It may not cover all possible information. If you have questions about this medicine, talk to your doctor, pharmacist, or health care provider.  2018 Elsevier/Gold Standard (2013-02-15 17:57:36)

## 2017-07-20 NOTE — Progress Notes (Signed)
[name], female   DOB: 1927/07/25, 84 y.o.   MRN: [id number]    Cardiology Office Note    Date:  07/20/2017   [name], DOB October 25, 1927, MRN [id number]  PCP:  [name], MD  Cardiologist:   Sanda Klein, MD   Chief Complaint  Patient presents with  . Chest Pain    tightness for 3+ wks no pain   . Shortness of Breath    1 wk when walking or talking    History of Present Illness:  Misty Blackwell is a 84 y.o. female who presents for permanent atrial fibrillation with slow ventricular response and pacemaker, remote TIA, HTN, minor CAD, asymptomatic pacemaker-detected NSVT, history of diastolic HF.  Is not doing as well as she did have a last appointment.  She has been running around trying to take care of her sick sister, taking her to multiple doctors appointments.  She has not been diuretic as prescribed since she cannot reach the restroom promptly when they are on the road.  She has had dietary indiscretions, such as [employer] roast beef sandwiches.  Heaviness in her chest and shortness of breath at rest, tries to lay back in her recliner, somewhat improved by sitting up and taking deep breaths.  Has had mild ankle puffiness.  Her electrocardiogram shows atrial fibrillation and mostly ventricular paced beats, some ventricular sensed beats with very deep T wave inversion in leads V4-V6 (hard to say if these are memory T waves or represent ischemia).  Pacemaker interrogation shows normal device function. She has approximately 4 years of estimated generator longevity. She is in permanent atrial fibrillation with slow ventricular response and requires 77% ventricular pacing, with very rare episodes of high ventricular rate. Her device is a Medtronic Adapta dual-chamber device implanted in 2011 for SSS, now programmed VVIR for permanent atrial fibrillation.   She denies any neurological events or bleeding complications on warfarin anticoagulation. Today INR is  3.0.  In 2016 she underwent echocardiography and nuclear stress testing.  Left ventricular systolic function was normal, she had a fixed moderate apical defect but without reversible ischemia.  Moderate tricuspid regurgitation and moderate pulmonary artery hypertension with estimated systolic PA pressure of 58 mmHg  Past Medical History:  Diagnosis Date  . Atrial fib/flutter, transient   . Atrial fibrillation, chronic ([place])   . CHF (congestive heart failure) ([place]) 10/29/2009   Echo - EF >55%; normal LV size and systolic function; unable to assess diastolic fcn due to E/A fusion, pulmonary vein flow pattern suggests elevated filling pressure; marked biatrail dilation, mild/mod tricuspid regurgitation; mod pulmonary htn; mild/mod mitral regurgitation; although echocardiographic features are incomplete findings suggest possible infiltrative cardiomyopathy (maybe amyloidosi  . Coronary artery disease 03/19/2002   R/P Cardiolite - EF 76%; nromal static and dynamic myocardial perfusion images; normal wall motion and endocardial thickening in all vascular territories  . Facial numbness 12/26/2008   carotid doppler - R and L ICAs 0-49% diameter reduction (velocities suggest low end of scale)  . Hypertension   . Pacemaker   . Peripheral neuropathy   . Skin cancer    s/p surgical removal.  . TIA (transient ischemic attack)     Past Surgical History:  Procedure Laterality Date  . ABDOMINAL HYSTERECTOMY    . APPENDECTOMY    . CARDIAC CATHETERIZATION  08/06/2005   minimal coronary disease predominant RCA; no significant atherosclerosis; new onset sick sinus syndrome and atrial flutter w/ ventricular response, controlled on med  therapy; systemic HTN, normal renal arteries  . CARDIOVERSION  11/19/2009   successful DCCV from AF to sinus type rhythm  . CHOLECYSTECTOMY    . Skin cancer resection      Outpatient Medications Prior to Visit  Medication Sig Dispense Refill  . ALPRAZolam (XANAX) 1 MG tablet  TAKE 1/2 TO 1 TABLET 2 TO 3 TIMES DAILY ONLY IF NEEDED FOR ACUTE ANXIETY ATTACK. LIMIT USE TO 5 DAYS A WEEK TO AVOID ADDICTION 90 tablet 0  . cholecalciferol (VITAMIN D) 1000 UNITS tablet Take 1,000 Units by mouth 3 (three) times daily.     Marland Kitchen diltiazem (TAZTIA XT) 240 MG 24 hr capsule Take 1 capsule (240 mg total) by mouth daily. 30 capsule 11  . potassium chloride SA (K-DUR,KLOR-CON) 20 MEQ tablet Take 1 tablet (20 mEq total) by mouth daily. 30 tablet 8  . warfarin (COUMADIN) 3 MG tablet TAKE 1 TO 1 AND 1/2 TABLET BY MOUTH TABLETS AS DIRECTED 135 tablet 0  . furosemide (LASIX) 40 MG tablet Take 0.5 tablets (20 mg total) by mouth daily as needed. 30 tablet 2  . gabapentin (NEURONTIN) 300 MG capsule Take 300 mg by mouth 3 (three) times daily.     No facility-administered medications prior to visit.      Allergies:   Latex; Ace inhibitors; Acrylic polymer [carbomer]; Augmentin [amoxicillin-pot clavulanate]; Ciprofloxacin; Levaquin [levofloxacin in d5w]; Zocor [simvastatin]; Chocolate; and Gabapentin   Social History   Socioeconomic History  . Marital status: Married    Spouse name: None  . Number of children: 0  . Years of education: None  . Highest education level: None  Social Needs  . Financial resource strain: None  . Food insecurity - worry: None  . Food insecurity - inability: None  . Transportation needs - medical: None  . Transportation needs - non-medical: None  Occupational History  . None  Tobacco Use  . Smoking status: Never Smoker  . Smokeless tobacco: Never Used  Substance and Sexual Activity  . Alcohol use: No  . Drug use: No  . Sexual activity: No  Other Topics Concern  . None  Social History Narrative   Widowed.  Lives alone.  Ambulates independently.     Family History:  The patient's family history includes Cirrhosis in her brother; Diabetes in her mother; Heart attack in her father; Heart disease in her father and mother.   ROS:   Please see the history  of present illness.    ROS All other systems reviewed and are negative.   PHYSICAL EXAM:   VS:  BP 132/66   Pulse 70   Ht 5' 4.5" (1.638 m)   Wt 151 lb 12.8 oz (68.9 kg)   SpO2 97%   BMI 25.65 kg/m    GEN: Well nourished, well developed, in no acute distress  HEENT: normal  Neck: 7-8 cm JVD, carotid bruits, or masses Cardiac: Irregular ; no murmurs, rubs, or gallops,no edema healthy left subclavian pacemaker site Respiratory: Few basilar rales are heard bilaterally GI: soft, nontender, nondistended, + BS MS: no deformity or atrophy  Skin: warm and dry, no rash Neuro:  Alert and Oriented x 3, Strength and sensation are intact Psych: euthymic mood, full affect  Wt Readings from Last 3 Encounters:  07/20/17 151 lb 12.8 oz (68.9 kg)  04/19/17 149 lb (67.6 kg)  01/07/17 151 lb 3.2 oz (68.6 kg)      Studies/Labs Reviewed:   EKG:  EKG is ordered today.  Has atrial  fibrillation with mostly ventricular paced rhythm, but with a few ventricular sensed beats.  There is deep lateral T wave inversion.  Recent Labs: 01/07/2017: Magnesium 2.0 04/19/2017: ALT 16; BUN 16; Creat 0.66; Hemoglobin 14.5; Platelets 246; Potassium 5.0; Sodium 142; TSH 1.43   Lipid Panel    Component Value Date/Time   CHOL 183 04/19/2017 0955   TRIG 109 04/19/2017 0955   HDL 49 (L) 04/19/2017 0955   CHOLHDL 3.7 04/19/2017 0955   VLDL 34 (H) 09/28/2016 1125   LDLCALC 112 (H) 04/19/2017 0955     ASSESSMENT:    1. Permanent atrial fibrillation ([place])   2. Atherosclerosis of native coronary artery of native heart with angina pectoris ([place])   3. Acute on chronic diastolic heart failure ([place])   4. Essential hypertension   5. Long term (current) use of anticoagulants   6. Pacemaker   7. Hyperlipidemia   8. Shortness of breath   9. Precordial chest pain      PLAN:  In order of problems listed above:  1. AFib with slow ventricular response and high percentage of ventricular pacing.  Have kept her on  diltiazem since she has occasional episodes of high ventricular rate.  It is hard to incriminate ventricular pacing burden as a cause of heart failure, since it has not changed from last year.  Likewise, her episodes of RVR are infrequent and brief and would not be expected to cause heart failure. She has high embolic risk with history of previous transient ischemic attack (CHADSVasc 8: age 38, TIA 2, HTN, CAD, CHF, gender).  On anticoagulation. 2. CAD: She is experiencing chest pressure at rest and this could represent angina pectoris.  Gave a prescription for sublingual nitroglycerin and will schedule for a repeat nuclear stress test.  This might also be related to decompensated heart failure. 3. CHF: Most of her symptoms sound like acute decompensation of chronic diastolic heart failure, which might be related to lower compliance with sodium restriction and diuretic therapy. Recheck echocardiogram.  It will be hard to evaluate filling pressures since she is in atrial fibrillation reassess left ventricular systolic function.  Increase furosemide to 40 mg daily and add a potassium supplement.  Bring her back to clinic promptly after she has repeat labs including a BNP (unfortunately no baseline to which to compare). 4. HTN: well-controlled 5. Warfarin: Well tolerated, no bleeding complications. Continue warfarin. INR 3.0 today. 6. PPM: Normal device function. Not device dependent. Remote downloads every 3 months and office visit yearly. 7. HLP: Due to previous side effects she declined statin therapy.  If we identify progression of coronary artery disease I would recommend that she take a PCSK9 inhibitor.   Medication Adjustments/Labs and Tests Ordered: Current medicines are reviewed at length with the patient today.  Concerns regarding medicines are outlined above.  Medication changes, Labs and Tests ordered today are listed in the Patient Instructions below. Patient Instructions  Medication  Instructions: Dr Sallyanne Kuster has recommended making the following medication changes: 1. INCREASE Furosemide to 40 mg DAILY 2. START Potassium 10 mEq daily 3. USE Nitroglycerin as needed  Labwork: NONE ORDERED  Testing/Procedures: 1. Echocardiogram - Your physician has requested that you have an echocardiogram. Echocardiography is a painless test that uses sound waves to create images of your heart. It provides your doctor with information about the size and shape of your heart and how well your heart's chambers and valves are working. This procedure takes approximately one hour. There are no restrictions for  this procedure.  2. Leane Call Stress Test - Your physician has requested that you have a lexiscan myoview. For further information please visit [email or web address]. Please follow instruction sheet, as given.  >>These tests will be performed at our [facility] location [place], [address]  Follow-up: Dr Sallyanne Kuster, next Tuesday 07/26/17 at 12:00p.  If you need a refill on your cardiac medications before your next appointment, please call your pharmacy.   Nitroglycerin sublingual tablets What is this medicine? NITROGLYCERIN (nye troe GLI ser in) is a type of vasodilator. It relaxes blood vessels, increasing the blood and oxygen supply to your heart. This medicine is used to relieve chest pain caused by angina. It is also used to prevent chest pain before activities like climbing stairs, going outdoors in cold weather, or sexual activity. This medicine may be used for other purposes; ask your health care provider or pharmacist if you have questions. COMMON BRAND NAME(S): Nitroquick, Nitrostat, Nitrotab What should I tell my health care provider before I take this medicine? They need to know if you have any of these conditions: -anemia -head injury, recent stroke, or bleeding in the brain -liver disease -previous heart attack -an unusual or allergic reaction to  nitroglycerin, other medicines, foods, dyes, or preservatives -pregnant or trying to get pregnant -breast-feeding How should I use this medicine? Take this medicine by mouth as needed. At the first sign of an angina attack (chest pain or tightness) place one tablet under your tongue. You can also take this medicine 5 to 10 minutes before an event likely to produce chest pain. Follow the directions on the prescription label. Let the tablet dissolve under the tongue. Do not swallow whole. Replace the dose if you accidentally swallow it. It will help if your mouth is not dry. Saliva around the tablet will help it to dissolve more quickly. Do not eat or drink, smoke or chew tobacco while a tablet is dissolving. If you are not better within 5 minutes after taking ONE dose of nitroglycerin, call 9-1-1 immediately to seek emergency medical care. Do not take more than 3 nitroglycerin tablets over 15 minutes. If you take this medicine often to relieve symptoms of angina, your doctor or health care professional may provide you with different instructions to manage your symptoms. If symptoms do not go away after following these instructions, it is important to call 9-1-1 immediately. Do not take more than 3 nitroglycerin tablets over 15 minutes. Talk to your pediatrician regarding the use of this medicine in children. Special care may be needed. Overdosage: If you think you have taken too much of this medicine contact a poison control center or emergency room at once. NOTE: This medicine is only for you. Do not share this medicine with others. What if I miss a dose? This does not apply. This medicine is only used as needed. What may interact with this medicine? Do not take this medicine with any of the following medications: -certain migraine medicines like ergotamine and dihydroergotamine (DHE) -medicines used to treat erectile dysfunction like sildenafil, tadalafil, and vardenafil -riociguat This medicine may  also interact with the following medications: -alteplase -aspirin -heparin -medicines for high blood pressure -medicines for mental depression -other medicines used to treat angina -phenothiazines like chlorpromazine, mesoridazine, prochlorperazine, thioridazine This list may not describe all possible interactions. Give your health care provider a list of all the medicines, herbs, non-prescription drugs, or dietary supplements you use. Also tell them if you smoke, drink alcohol, or use  illegal drugs. Some items may interact with your medicine. What should I watch for while using this medicine? Tell your doctor or health care professional if you feel your medicine is no longer working. Keep this medicine with you at all times. Sit or lie down when you take your medicine to prevent falling if you feel dizzy or faint after using it. Try to remain calm. This will help you to feel better faster. If you feel dizzy, take several deep breaths and lie down with your feet propped up, or bend forward with your head resting between your knees. You may get drowsy or dizzy. Do not drive, use machinery, or do anything that needs mental alertness until you know how this drug affects you. Do not stand or sit up quickly, especially if you are an older patient. This reduces the risk of dizzy or fainting spells. Alcohol can make you more drowsy and dizzy. Avoid alcoholic drinks. Do not treat yourself for coughs, colds, or pain while you are taking this medicine without asking your doctor or health care professional for advice. Some ingredients may increase your blood pressure. What side effects may I notice from receiving this medicine? Side effects that you should report to your doctor or health care professional as soon as possible: -blurred vision -dry mouth -skin rash -sweating -the feeling of extreme pressure in the head -unusually weak or tired Side effects that usually do not require medical attention  (report to your doctor or health care professional if they continue or are bothersome): -flushing of the face or neck -headache -irregular heartbeat, palpitations -nausea, vomiting This list may not describe all possible side effects. Call your doctor for medical advice about side effects. You may report side effects to FDA at 1-800-FDA-1088. Where should I keep my medicine? Keep out of the reach of children. Store at room temperature between 20 and 25 degrees C (68 and 77 degrees F). Store in [employer]. Protect from light and moisture. Keep tightly closed. Throw away any unused medicine after the expiration date. NOTE: This sheet is a summary. It may not cover all possible information. If you have questions about this medicine, talk to your doctor, pharmacist, or health care provider.  2018 Elsevier/Gold Standard (2013-02-15 17:57:36)       Signed, Sanda Klein, MD  07/20/2017 2:25 PM    [address], [place], [place]  [address] Phone: [phone]; Fax: [phone]

## 2017-07-21 ENCOUNTER — Encounter: Payer: Self-pay | Admitting: Cardiology

## 2017-07-21 NOTE — Addendum Note (Signed)
Addended by: Jacqulynn Cadet on: 07/21/2017 10:34 AM   Modules accepted: Orders

## 2017-07-24 ENCOUNTER — Encounter: Payer: Self-pay | Admitting: Internal Medicine

## 2017-07-24 NOTE — Patient Instructions (Signed)

## 2017-07-24 NOTE — Progress Notes (Signed)
This very nice 84 y.o.  [name] presents for 6 month follow up with HTN, cAfib, minimal CAD/PPM,  HLD, Pre-Diabetes and Vitamin D Deficiency. Her GERD is controlled with diet.      Patient is treated for HTN (1998) & BP has been controlled at home. Today's BP is elevated at 148/90 and rechecked x 2 at 160 /104.  Patient had negative /Nl Ht caths in x2 in 1993 & 2007. In 2003, her Myoview was Negative for Ischemia.  In 2006 , she had an embolic CVA with Rt hemifacial paresthesias (resolved) and was found in Afib and has been on Coumadin since - monitored at [facility]. She had a PPM implanted in 2007 for SSS and is followed by Dr Sallyanne Kuster.  Patient reports recent sx's of dyspnea & chest heaviness w/recumbancy consistent with Orthopnea / PND improved with restart of her Lasix by Dr Sallyanne Kuster and she reports about 10 # of water lost.  Patient has had no complaints of any cardiac type exertional chest pain, palpitations, dizziness, claudication or dependent edema.     Hyperlipidemia is controlled in the past with diet, but last LP was not at goal: Lab Results  Component Value Date   CHOL 183 04/19/2017   HDL 49 (L) 04/19/2017   LDLCALC 112 (H) 04/19/2017   TRIG 109 04/19/2017   CHOLHDL 3.7 04/19/2017      Also, the patient has history of PreDiabetes (A1c 5.9%/Insulin elevated  86 /2011)  and has had no symptoms of reactive hypoglycemia, diabetic polys, paresthesias or visual blurring.  Last A1c was Normal & at goal: Lab Results  Component Value Date   HGBA1C 5.5 04/19/2017      Further, the patient also has history of Vitamin D Deficiency ("13"/2008)  and supplements vitamin D without any suspected side-effects. Last vitamin D was at goal: Lab Results  Component Value Date   VD25OH 63 04/19/2017   Current Outpatient Medications on File Prior to Visit  Medication Sig  . ALPRAZolam (XANAX) 1 MG tablet TAKE 1/2 TO 1 TABLET 2 TO 3 TIMES DAILY ONLY IF NEEDED FOR ACUTE ANXIETY ATTACK.  LIMIT USE TO 5 DAYS A WEEK TO AVOID ADDICTION  . cholecalciferol (VITAMIN D) 1000 UNITS tablet Take 1,000 Units by mouth 3 (three) times daily.   Marland Kitchen diltiazem (TAZTIA XT) 240 MG 24 hr capsule Take 1 capsule (240 mg total) by mouth daily.  . furosemide (LASIX) 40 MG tablet Take 1 tablet (40 mg total) by mouth daily.  . nitroGLYCERIN (NITROSTAT) 0.4 MG SL tablet Place 1 tablet (0.4 mg total) under the tongue every 5 (five) minutes as needed for chest pain.  . potassium chloride SA (K-DUR,KLOR-CON) 20 MEQ tablet Take 1 tablet (20 mEq total) by mouth daily.  Marland Kitchen warfarin (COUMADIN) 3 MG tablet TAKE 1 TO 1 AND 1/2 TABLET BY MOUTH TABLETS AS DIRECTED   No current facility-administered medications on file prior to visit.    Allergies  Allergen Reactions  . Latex Itching  . Ace Inhibitors     Unknown   . Acrylic Polymer [Carbomer]   . Augmentin [Amoxicillin-Pot Clavulanate]   . Ciprofloxacin   . Levaquin [Levofloxacin In D5w]   . Zocor [Simvastatin]   . Chocolate Other (See Comments)    migraine's   . Gabapentin Other (See Comments)    Unsteady gait    PMHx:   Past Medical History:  Diagnosis Date  . Atrial fib/flutter, transient   . Atrial fibrillation,  chronic ([place])   . CHF (congestive heart failure) ([place]) 10/29/2009   Echo - EF >55%; normal LV size and systolic function; unable to assess diastolic fcn due to E/A fusion, pulmonary vein flow pattern suggests elevated filling pressure; marked biatrail dilation, mild/mod tricuspid regurgitation; mod pulmonary htn; mild/mod mitral regurgitation; although echocardiographic features are incomplete findings suggest possible infiltrative cardiomyopathy (maybe amyloidosi  . Coronary artery disease 03/19/2002   R/P Cardiolite - EF 76%; nromal static and dynamic myocardial perfusion images; normal wall motion and endocardial thickening in all vascular territories  . Facial numbness 12/26/2008   carotid doppler - R and L ICAs 0-49% diameter reduction  (velocities suggest low end of scale)  . Hypertension   . Pacemaker   . Peripheral neuropathy   . Skin cancer    s/p surgical removal.  . TIA (transient ischemic attack)    Immunization History  Administered Date(s) Administered  . DT 03/19/2015  . Influenza Split 05/04/2011  . Influenza, High Dose Seasonal PF 03/19/2014, 12/23/2015  . Influenza,inj,quad, With Preservative 05/21/2013  . Influenza-Unspecified 02/04/2015, 02/03/2017  . Pneumococcal Conjugate-13 03/19/2014  . Pneumococcal Polysaccharide-23 05/04/2011  . Pneumococcal-Unspecified 05/03/2001  . Td 05/04/2003   Past Surgical History:  Procedure Laterality Date  . ABDOMINAL HYSTERECTOMY    . APPENDECTOMY    . CARDIAC CATHETERIZATION  08/06/2005   minimal coronary disease predominant RCA; no significant atherosclerosis; new onset sick sinus syndrome and atrial flutter w/ ventricular response, controlled on med therapy; systemic HTN, normal renal arteries  . CARDIOVERSION  11/19/2009   successful DCCV from AF to sinus type rhythm  . CHOLECYSTECTOMY    . Skin cancer resection     FHx:    Reviewed / unchanged  SHx:    Reviewed / unchanged  Systems Review:  Constitutional: Denies fever, chills, wt changes, headaches, insomnia, fatigue, night sweats, change in appetite. Eyes: Denies redness, blurred vision, diplopia, discharge, itchy, watery eyes.  ENT: Denies discharge, congestion, post nasal drip, epistaxis, sore throat, earache, hearing loss, dental pain, tinnitus, vertigo, sinus pain, snoring.  CV: Denies chest pain, palpitations, irregular heartbeat, syncope, dyspnea, diaphoresis, orthopnea, PND, claudication or edema. Respiratory: denies cough, dyspnea, DOE, pleurisy, hoarseness, laryngitis, wheezing.  Gastrointestinal: Denies dysphagia, odynophagia, heartburn, reflux, water brash, abdominal pain or cramps, nausea, vomiting, bloating, diarrhea, constipation, hematemesis, melena, hematochezia  or  hemorrhoids. Genitourinary: Denies dysuria, frequency, urgency, nocturia, hesitancy, discharge, hematuria or flank pain. Musculoskeletal: Denies arthralgias, myalgias, stiffness, jt. swelling, pain, limping or strain/sprain.  Skin: Denies pruritus, rash, hives, warts, acne, eczema or change in skin lesion(s). Neuro: No weakness, tremor, incoordination, spasms, paresthesia or pain. Psychiatric: Denies confusion, memory loss or sensory loss. Endo: Denies change in weight, skin or hair change.  Heme/Lymph: No excessive bleeding, bruising or enlarged lymph nodes.  Physical Exam  BP (!) 148/90   Pulse 80   Temp (!) 97.3 F (36.3 C)   Resp 18   Ht 5\' 4"  (1.626 m)   Wt 149 lb 6.4 oz (67.8 kg)   BMI 25.64 kg/m   Appears  well nourished, well groomed  and in no distress.  Eyes: PERRLA, EOMs, conjunctiva no swelling or erythema. Sinuses: No frontal/maxillary tenderness ENT/Mouth: EAC's clear, TM's nl w/o erythema, bulging. Nares clear w/o erythema, swelling, exudates. Oropharynx clear without erythema or exudates. Oral hygiene is good. Tongue normal, non obstructing. Hearing intact.  Neck: Supple. Thyroid not palpable. Car 2+/2+ without bruits, nodes or JVD. Chest: Respirations nl with BS clear & equal w/o rales, rhonchi, wheezing  or stridor.  Cor: Heart sounds normal w/ regular rate and rhythm without sig. murmurs, gallops, clicks or rubs. Peripheral pulses normal and equal  without edema.  Abdomen: Soft & bowel sounds normal. Non-tender w/o guarding, rebound, hernias, masses or organomegaly.  Lymphatics: Unremarkable.  Musculoskeletal: Full ROM all peripheral extremities, joint stability, 5/5 strength and normal gait.  Skin: Warm, dry without exposed rashes, lesions or ecchymosis apparent.  Neuro: Cranial nerves intact, reflexes equal bilaterally. Sensory-motor testing grossly intact. Tendon reflexes grossly intact.  Pysch: Alert & oriented x 3.  Insight and judgement nl & appropriate. No  ideations.  Assessment and Plan:  1. Essential hypertension  - Add Rx Micardis 80 mg & start at 1/2 tab /daily & call BP readings in 10 days.   - Continue medication, monitor blood pressure at home.  - Continue DASH diet. Reminder to go to the ER if any CP,  SOB, nausea, dizziness, severe HA, changes vision/speech.  - CBC with Differential/Platelet - BASIC METABOLIC PANEL WITH GFR - Magnesium - TSH  2. Hyperlipidemia, mixed  - Continue diet/meds, exercise,& lifestyle modifications.  - Continue monitor periodic cholesterol/liver & renal functions   - Hepatic function panel - Lipid panel - TSH  3. Abnormal glucose  - Hemoglobin A1c - Insulin, random  4. Vitamin D deficiency  - Continue diet, exercise, lifestyle modifications.  - Monitor appropriate labs. - Continue supplementation.  - VITAMIN D 25 Hydroxyl  5. Prediabetes  - Hemoglobin A1c - Insulin, random  6. Chronic atrial fibrillation (HCC)  - TSH  7. Depression, controlled  8. Medication management  - CBC with Differential/Platelet - BASIC METABOLIC PANEL WITH GFR - Hepatic function panel - Magnesium - Lipid panel - TSH - Hemoglobin A1c - Insulin, random - VITAMIN D 25 Hydroxyl        Discussed  regular exercise, BP monitoring, weight control to achieve/maintain BMI less than 25 and discussed med and SE's. Recommended labs to assess and monitor clinical status with further disposition pending results of labs. Over 30 minutes of exam, counseling, chart review was performed.

## 2017-07-25 ENCOUNTER — Ambulatory Visit (INDEPENDENT_AMBULATORY_CARE_PROVIDER_SITE_OTHER): Payer: Medicare Other | Admitting: Internal Medicine

## 2017-07-25 VITALS — BP 148/90 | HR 80 | Temp 97.3°F | Resp 18 | Ht 64.0 in | Wt 149.4 lb

## 2017-07-25 DIAGNOSIS — E559 Vitamin D deficiency, unspecified: Secondary | ICD-10-CM

## 2017-07-25 DIAGNOSIS — I10 Essential (primary) hypertension: Secondary | ICD-10-CM

## 2017-07-25 DIAGNOSIS — E782 Mixed hyperlipidemia: Secondary | ICD-10-CM

## 2017-07-25 DIAGNOSIS — I482 Chronic atrial fibrillation, unspecified: Secondary | ICD-10-CM

## 2017-07-25 DIAGNOSIS — R7303 Prediabetes: Secondary | ICD-10-CM | POA: Diagnosis not present

## 2017-07-25 DIAGNOSIS — Z79899 Other long term (current) drug therapy: Secondary | ICD-10-CM

## 2017-07-25 DIAGNOSIS — F329 Major depressive disorder, single episode, unspecified: Secondary | ICD-10-CM

## 2017-07-25 DIAGNOSIS — F32A Depression, unspecified: Secondary | ICD-10-CM

## 2017-07-25 DIAGNOSIS — R7309 Other abnormal glucose: Secondary | ICD-10-CM

## 2017-07-25 MED ORDER — TELMISARTAN 80 MG PO TABS: ORAL_TABLET | ORAL | 1 refills | Status: DC

## 2017-07-26 ENCOUNTER — Encounter: Payer: Self-pay | Admitting: Cardiovascular Disease

## 2017-07-26 ENCOUNTER — Ambulatory Visit (INDEPENDENT_AMBULATORY_CARE_PROVIDER_SITE_OTHER): Payer: Medicare Other | Admitting: Cardiovascular Disease

## 2017-07-26 VITALS — BP 142/70 | HR 82 | Ht 64.5 in | Wt 150.0 lb

## 2017-07-26 DIAGNOSIS — I25118 Atherosclerotic heart disease of native coronary artery with other forms of angina pectoris: Secondary | ICD-10-CM | POA: Diagnosis not present

## 2017-07-26 DIAGNOSIS — I5032 Chronic diastolic (congestive) heart failure: Secondary | ICD-10-CM | POA: Diagnosis not present

## 2017-07-26 DIAGNOSIS — I10 Essential (primary) hypertension: Secondary | ICD-10-CM

## 2017-07-26 DIAGNOSIS — I482 Chronic atrial fibrillation: Secondary | ICD-10-CM | POA: Diagnosis not present

## 2017-07-26 DIAGNOSIS — I4821 Permanent atrial fibrillation: Secondary | ICD-10-CM

## 2017-07-26 LAB — CBC WITH DIFFERENTIAL/PLATELET
BASOS ABS: 100 {cells}/uL (ref 0–200)
BASOS PCT: 1.2 %
EOS PCT: 1.2 %
Eosinophils Absolute: 100 cells/uL (ref 15–500)
HCT: 43.9 % (ref 35.0–45.0)
HEMOGLOBIN: 15 g/dL (ref 11.7–15.5)
LYMPHS ABS: 3071 {cells}/uL (ref 850–3900)
MCH: 30.5 pg (ref 27.0–33.0)
MCHC: 34.2 g/dL (ref 32.0–36.0)
MCV: 89.2 fL (ref 80.0–100.0)
MONOS PCT: 9.5 %
MPV: 10.9 fL (ref 7.5–12.5)
Neutro Abs: 4241 cells/uL (ref 1500–7800)
Neutrophils Relative %: 51.1 %
Platelets: 256 10*3/uL (ref 140–400)
RBC: 4.92 10*6/uL (ref 3.80–5.10)
RDW: 11.7 % (ref 11.0–15.0)
Total Lymphocyte: 37 %
WBC mixed population: 789 cells/uL (ref 200–950)
WBC: 8.3 10*3/uL (ref 3.8–10.8)

## 2017-07-26 LAB — TSH: TSH: 0.71 mIU/L (ref 0.40–4.50)

## 2017-07-26 LAB — HEPATIC FUNCTION PANEL
AG Ratio: 1.6 (calc) (ref 1.0–2.5)
ALKALINE PHOSPHATASE (APISO): 135 U/L — AB (ref 33–130)
ALT: 16 U/L (ref 6–29)
AST: 20 U/L (ref 10–35)
Albumin: 4.6 g/dL (ref 3.6–5.1)
BILIRUBIN TOTAL: 0.6 mg/dL (ref 0.2–1.2)
Bilirubin, Direct: 0.1 mg/dL (ref 0.0–0.2)
Globulin: 2.8 g/dL (calc) (ref 1.9–3.7)
Indirect Bilirubin: 0.5 mg/dL (calc) (ref 0.2–1.2)
Total Protein: 7.4 g/dL (ref 6.1–8.1)

## 2017-07-26 LAB — LIPID PANEL
CHOL/HDL RATIO: 3.7 (calc) (ref <5.0)
Cholesterol: 188 mg/dL (ref <200)
HDL: 51 mg/dL (ref >50)
LDL CHOLESTEROL (CALC): 109 mg/dL — AB
NON-HDL CHOLESTEROL (CALC): 137 mg/dL — AB (ref <130)
TRIGLYCERIDES: 157 mg/dL — AB (ref <150)

## 2017-07-26 LAB — INSULIN, RANDOM: Insulin: 11.7 u[IU]/mL (ref 2.0–19.6)

## 2017-07-26 LAB — BASIC METABOLIC PANEL WITH GFR
BUN: 19 mg/dL (ref 7–25)
CHLORIDE: 100 mmol/L (ref 98–110)
CO2: 35 mmol/L — AB (ref 20–32)
Calcium: 9.7 mg/dL (ref 8.6–10.4)
Creat: 0.86 mg/dL (ref 0.60–0.88)
GFR, Est African American: 70 mL/min/{1.73_m2} (ref >=60)
GFR, Est Non African American: 61 mL/min/{1.73_m2} (ref >=60)
GLUCOSE: 89 mg/dL (ref 65–99)
POTASSIUM: 3.4 mmol/L — AB (ref 3.5–5.3)
SODIUM: 142 mmol/L (ref 135–146)

## 2017-07-26 LAB — VITAMIN D 25 HYDROXY (VIT D DEFICIENCY, FRACTURES): Vit D, 25-Hydroxy: 57 ng/mL (ref 30–100)

## 2017-07-26 LAB — HEMOGLOBIN A1C
EAG (MMOL/L): 6.3 (calc)
Hgb A1c MFr Bld: 5.6 % of total Hgb (ref <5.7)
Mean Plasma Glucose: 114 (calc)

## 2017-07-26 LAB — MAGNESIUM: Magnesium: 2.1 mg/dL (ref 1.5–2.5)

## 2017-07-26 MED ORDER — POTASSIUM CHLORIDE CRYS ER 20 MEQ PO TBCR: 40.0000 meq | EXTENDED_RELEASE_TABLET | Freq: Every day | ORAL | 3 refills | Status: DC

## 2017-07-26 NOTE — Progress Notes (Signed)
[name], female   DOB: [birth year], 84 y.o.   MRN: [id number]    Cardiology Office Note    Date:  07/26/2017   [name], DOB [birth year], MRN [id number]  PCP:  Misty Pinto, MD  Cardiologist:   Misty Klein, MD   No chief complaint on file.   History of Present Illness:  Misty Blackwell is a 84 y.o. female who presents for follow-up for shortness of breath and chest pressure that is occurring on a background of permanent atrial fibrillation with slow ventricular response and pacemaker, remote TIA, HTN, minor CAD, asymptomatic pacemaker-detected NSVT, history of diastolic HF.  Her problems seemed to occur due to emotional and physical stress while caring for her sister.  Things have improved on a higher dose of diuretic.  Labs performed yesterday showed mild hypokalemia with a potassium of 3.4.  She has not yet had her echocardiogram or nuclear stress test.  Her weight is really not changed, which is surprising since she has fewer signs of volume overload on physical exam today.  In 2016 she underwent echocardiography and nuclear stress testing.  Left ventricular systolic function was normal, she had a fixed moderate apical defect but without reversible ischemia.  Moderate tricuspid regurgitation and moderate pulmonary artery hypertension with estimated systolic PA pressure of 58 mmHg  Past Medical History:  Diagnosis Date  . Atrial fib/flutter, transient   . Atrial fibrillation, chronic ([place])   . CHF (congestive heart failure) ([place]) 10/29/2009   Echo - EF >55%; normal LV size and systolic function; unable to assess diastolic fcn due to E/A fusion, pulmonary vein flow pattern suggests elevated filling pressure; marked biatrail dilation, mild/mod tricuspid regurgitation; mod pulmonary htn; mild/mod mitral regurgitation; although echocardiographic features are incomplete findings suggest possible infiltrative cardiomyopathy (maybe amyloidosi  . Coronary artery  disease 03/19/2002   R/P Cardiolite - EF 76%; nromal static and dynamic myocardial perfusion images; normal wall motion and endocardial thickening in all vascular territories  . Facial numbness 12/26/2008   carotid doppler - R and L ICAs 0-49% diameter reduction (velocities suggest low end of scale)  . Hypertension   . Pacemaker   . Peripheral neuropathy   . Skin cancer    s/p surgical removal.  . TIA (transient ischemic attack)     Past Surgical History:  Procedure Laterality Date  . ABDOMINAL HYSTERECTOMY    . APPENDECTOMY    . CARDIAC CATHETERIZATION  08/06/2005   minimal coronary disease predominant RCA; no significant atherosclerosis; new onset sick sinus syndrome and atrial flutter w/ ventricular response, controlled on med therapy; systemic HTN, normal renal arteries  . CARDIOVERSION  11/19/2009   successful DCCV from AF to sinus type rhythm  . CHOLECYSTECTOMY    . Skin cancer resection      Outpatient Medications Prior to Visit  Medication Sig Dispense Refill  . ALPRAZolam (XANAX) 1 MG tablet TAKE 1/2 TO 1 TABLET 2 TO 3 TIMES DAILY ONLY IF NEEDED FOR ACUTE ANXIETY ATTACK. LIMIT USE TO 5 DAYS A WEEK TO AVOID ADDICTION 90 tablet 0  . cholecalciferol (VITAMIN D) 1000 UNITS tablet Take 1,000 Units by mouth 3 (three) times daily.     Marland Kitchen diltiazem (TAZTIA XT) 240 MG 24 hr capsule Take 1 capsule (240 mg total) by mouth daily. 30 capsule 11  . furosemide (LASIX) 40 MG tablet Take 1 tablet (40 mg total) by mouth daily. 90 tablet 3  . nitroGLYCERIN (NITROSTAT) 0.4 MG SL tablet Place  1 tablet (0.4 mg total) under the tongue every 5 (five) minutes as needed for chest pain. 25 tablet 3  . telmisartan (MICARDIS) 80 MG tablet Take 1/2 to 1 tablet every morning for BP & Heart 90 tablet 1  . warfarin (COUMADIN) 3 MG tablet TAKE 1 TO 1 AND 1/2 TABLET BY MOUTH TABLETS AS DIRECTED 135 tablet 0  . potassium chloride SA (K-DUR,KLOR-CON) 20 MEQ tablet Take 1 tablet (20 mEq total) by mouth daily. 30  tablet 8   No facility-administered medications prior to visit.      Allergies:   Latex; Ace inhibitors; Acrylic polymer [carbomer]; Augmentin [amoxicillin-pot clavulanate]; Ciprofloxacin; Levaquin [levofloxacin in d5w]; Zocor [simvastatin]; Chocolate; and Gabapentin   Social History   Socioeconomic History  . Marital status: Married    Spouse name: Not on file  . Number of children: 0  . Years of education: Not on file  . Highest education level: Not on file  Occupational History  . Not on file  Social Needs  . Financial resource strain: Not on file  . Food insecurity:    Worry: Not on file    Inability: Not on file  . Transportation needs:    Medical: Not on file    Non-medical: Not on file  Tobacco Use  . Smoking status: Never Smoker  . Smokeless tobacco: Never Used  Substance and Sexual Activity  . Alcohol use: No  . Drug use: No  . Sexual activity: Never  Lifestyle  . Physical activity:    Days per week: Not on file    Minutes per session: Not on file  . Stress: Not on file  Relationships  . Social connections:    Talks on phone: Not on file    Gets together: Not on file    Attends religious service: Not on file    Active member of club or organization: Not on file    Attends meetings of clubs or organizations: Not on file    Relationship status: Not on file  Other Topics Concern  . Not on file  Social History Narrative   Widowed.  Lives alone.  Ambulates independently.     Family History:  The patient's family history includes Cirrhosis in her brother; Diabetes in her mother; Heart attack in her father; Heart disease in her father and mother.   ROS:   Please see the history of present illness.    ROS All other systems reviewed and are negative.   PHYSICAL EXAM:   VS:  BP (!) 142/70   Pulse 82   Ht 5' 4.5" (1.638 m)   Wt 150 lb (68 kg)   BMI 25.35 kg/m     General: Alert, oriented x3, no distress Head: no evidence of trauma, PERRL, EOMI, no  exophtalmos or lid lag, no myxedema, no xanthelasma; normal ears, nose and oropharynx Neck: normal jugular venous pulsations and no hepatojugular reflux; brisk carotid pulses without delay and no carotid bruits Chest: clear to auscultation, no signs of consolidation by percussion or palpation, normal fremitus, symmetrical and full respiratory excursions Cardiovascular: normal position and quality of the apical impulse, regular rhythm, normal first and paradoxically split second heart sounds, no murmurs, rubs or gallops Abdomen: no tenderness or distention, no masses by palpation, no abnormal pulsatility or arterial bruits, normal bowel sounds, no hepatosplenomegaly Extremities: no clubbing, cyanosis or edema; 2+ radial, ulnar and brachial pulses bilaterally; 2+ right femoral, posterior tibial and dorsalis pedis pulses; 2+ left femoral, posterior tibial and dorsalis  pedis pulses; no subclavian or femoral bruits Neurological: grossly nonfocal Psych: Normal mood and affect   Wt Readings from Last 3 Encounters:  07/26/17 150 lb (68 kg)  07/25/17 149 lb 6.4 oz (67.8 kg)  07/20/17 151 lb 12.8 oz (68.9 kg)      Studies/Labs Reviewed:   EKG:  EKG is ordered today.  Has atrial fibrillation with mostly ventricular paced rhythm, but with a few ventricular sensed beats.  There is deep lateral T wave inversion.  Recent Labs: 07/25/2017: ALT 16; BUN 19; Creat 0.86; Hemoglobin 15.0; Magnesium 2.1; Platelets 256; Potassium 3.4; Sodium 142; TSH 0.71   Lipid Panel    Component Value Date/Time   CHOL 188 07/25/2017 0949   TRIG 157 (H) 07/25/2017 0949   HDL 51 07/25/2017 0949   CHOLHDL 3.7 07/25/2017 0949   VLDL 34 (H) 09/28/2016 1125   LDLCALC 109 (H) 07/25/2017 0949     ASSESSMENT:    1. Permanent atrial fibrillation ([place])   2. Chronic diastolic heart failure ([place])   3. Coronary artery disease involving native coronary artery of native heart with other form of angina pectoris ([place])   4.  Essential hypertension      PLAN:  In order of problems listed above:  1. AFib with slow ventricular response and high percentage of ventricular pacing.   She has high embolic risk with history of previous transient ischemic attack (CHADSVasc 8: age 89, TIA 2, HTN, CAD, CHF, gender).  On anticoagulation.  Pacing has really not changed in the frequency of RVR is low, unlikely to be a cause of heart failure exacerbation. 2. CAD: Symptoms improved since a few days ago.  Scheduled for Lexiscan Myoview 3. CHF: Her symptoms have improved with the increased dose of diuretic.  We will increase the potassium supplement for potassium of 3.4 yesterday. 4. HTN: Systolic blood pressure is a little high, but no adjustments were made to her medications today. 5. Warfarin: Well tolerated, no bleeding complications.  6. PPM: Normal device function. Not device dependent. Remote downloads every 3 months and office visit yearly. 7. HLP: Due to previous side effects she declined statin therapy.  If we identify progression of coronary artery disease I would recommend that she take a PCSK9 inhibitor.   Medication Adjustments/Labs and Tests Ordered: Current medicines are reviewed at length with the patient today.  Concerns regarding medicines are outlined above.  Medication changes, Labs and Tests ordered today are listed in the Patient Instructions below. Patient Instructions  Dr Sallyanne Kuster has recommended making the following medication changes: 1. INCREASE Potassium to 40 mEq daily  Follow-up after your echocardiogram and stress test.      Signed, Misty Klein, MD  07/26/2017 1:03 PM    [facility] [place], [place], [place]  [address] Phone: [phone]; Fax: [phone]

## 2017-07-26 NOTE — Patient Instructions (Signed)
Dr Sallyanne Kuster has recommended making the following medication changes: 1. INCREASE Potassium to 40 mEq daily  Follow-up after your echocardiogram and stress test.

## 2017-07-27 NOTE — Addendum Note (Signed)
Addended by: Jacqulynn Cadet on: 07/27/2017 02:30 PM   Modules accepted: Orders

## 2017-08-03 ENCOUNTER — Telehealth (HOSPITAL_COMMUNITY): Payer: Self-pay

## 2017-08-03 ENCOUNTER — Other Ambulatory Visit: Payer: Self-pay

## 2017-08-03 ENCOUNTER — Ambulatory Visit (HOSPITAL_COMMUNITY): Payer: Medicare Other | Attending: Cardiovascular Disease

## 2017-08-03 DIAGNOSIS — I10 Essential (primary) hypertension: Secondary | ICD-10-CM | POA: Insufficient documentation

## 2017-08-03 DIAGNOSIS — I4891 Unspecified atrial fibrillation: Secondary | ICD-10-CM | POA: Insufficient documentation

## 2017-08-03 DIAGNOSIS — I2510 Atherosclerotic heart disease of native coronary artery without angina pectoris: Secondary | ICD-10-CM | POA: Insufficient documentation

## 2017-08-03 DIAGNOSIS — I272 Pulmonary hypertension, unspecified: Secondary | ICD-10-CM | POA: Diagnosis not present

## 2017-08-03 DIAGNOSIS — R0602 Shortness of breath: Secondary | ICD-10-CM

## 2017-08-03 NOTE — Telephone Encounter (Signed)
Encounter complete. 

## 2017-08-04 ENCOUNTER — Telehealth: Payer: Self-pay | Admitting: *Deleted

## 2017-08-04 NOTE — Telephone Encounter (Signed)
Patient called and reported her readings on the new BP medication, Micardis 80 mg 1/2 tablet.  Her BP was 123/76 on the firest day of treatment and today the reading is 126/67, with a pulse of 88.  Dr Melford Aase is aware.

## 2017-08-05 ENCOUNTER — Ambulatory Visit (HOSPITAL_COMMUNITY)
Admission: RE | Admit: 2017-08-05 | Discharge: 2017-08-05 | Disposition: A | Discharge status: Home / Self Care | Payer: Medicare Other | Source: Ambulatory Visit | Attending: Cardiovascular Disease | Admitting: Cardiovascular Disease

## 2017-08-05 DIAGNOSIS — R072 Precordial pain: Secondary | ICD-10-CM | POA: Diagnosis not present

## 2017-08-05 DIAGNOSIS — I25119 Atherosclerotic heart disease of native coronary artery with unspecified angina pectoris: Secondary | ICD-10-CM | POA: Diagnosis not present

## 2017-08-05 LAB — MYOCARDIAL PERFUSION IMAGING
CHL CUP NUCLEAR SRS: 1
CHL CUP NUCLEAR SSS: 1
CSEPPHR: 69 {beats}/min
LV dias vol: 77 mL (ref 46–106)
LV sys vol: 23 mL
Rest HR: 62 {beats}/min
TID: 1.01

## 2017-08-05 MED ORDER — REGADENOSON 0.4 MG/5ML IV SOLN
0.4000 mg | Freq: Once | INTRAVENOUS | Status: AC
  Administered 2017-08-05: 0.4 mg via INTRAVENOUS

## 2017-08-05 MED ORDER — TECHNETIUM TC 99M TETROFOSMIN IV KIT
10.2000 | PACK | Freq: Once | INTRAVENOUS | Status: AC | PRN
  Administered 2017-08-05: 10.2 via INTRAVENOUS
  Filled 2017-08-05: qty 11

## 2017-08-05 MED ORDER — TECHNETIUM TC 99M TETROFOSMIN IV KIT
30.4000 | PACK | Freq: Once | INTRAVENOUS | Status: AC | PRN
  Administered 2017-08-05: 30.4 via INTRAVENOUS
  Filled 2017-08-05: qty 31

## 2017-08-11 ENCOUNTER — Encounter: Payer: Self-pay | Admitting: Physician Assistant

## 2017-08-11 ENCOUNTER — Ambulatory Visit (INDEPENDENT_AMBULATORY_CARE_PROVIDER_SITE_OTHER): Payer: Medicare Other | Admitting: Physician Assistant

## 2017-08-11 VITALS — BP 140/78 | HR 80 | Ht 64.5 in | Wt 149.2 lb

## 2017-08-11 DIAGNOSIS — I10 Essential (primary) hypertension: Secondary | ICD-10-CM

## 2017-08-11 DIAGNOSIS — Z950 Presence of cardiac pacemaker: Secondary | ICD-10-CM | POA: Diagnosis not present

## 2017-08-11 DIAGNOSIS — I5032 Chronic diastolic (congestive) heart failure: Secondary | ICD-10-CM | POA: Diagnosis not present

## 2017-08-11 DIAGNOSIS — I2510 Atherosclerotic heart disease of native coronary artery without angina pectoris: Secondary | ICD-10-CM

## 2017-08-11 DIAGNOSIS — I4821 Permanent atrial fibrillation: Secondary | ICD-10-CM

## 2017-08-11 DIAGNOSIS — I482 Chronic atrial fibrillation: Secondary | ICD-10-CM | POA: Diagnosis not present

## 2017-08-11 LAB — CUP PACEART REMOTE DEVICE CHECK
Battery Remaining Longevity: 45 mo
Battery Voltage: 2.77 V
Brady Statistic RV Percent Paced: 77 %
Date Time Interrogation Session: 20190320112113
Implantable Lead Implant Date: 20110428
Implantable Lead Location: 753859
Implantable Lead Model: 4092
Lead Channel Setting Pacing Pulse Width: 0.4 ms
MDC IDC LEAD IMPLANT DT: 20110428
MDC IDC LEAD LOCATION: 753860
MDC IDC MSMT BATTERY IMPEDANCE: 1484 Ohm
MDC IDC MSMT LEADCHNL RA IMPEDANCE VALUE: 67 Ohm
MDC IDC MSMT LEADCHNL RV IMPEDANCE VALUE: 739 Ohm
MDC IDC MSMT LEADCHNL RV PACING THRESHOLD AMPLITUDE: 0.75 V
MDC IDC MSMT LEADCHNL RV PACING THRESHOLD PULSEWIDTH: 0.4 ms
MDC IDC PG IMPLANT DT: 20110428
MDC IDC SET LEADCHNL RV PACING AMPLITUDE: 2.5 V
MDC IDC SET LEADCHNL RV SENSING SENSITIVITY: 4 mV

## 2017-08-11 MED ORDER — POTASSIUM CHLORIDE CRYS ER 20 MEQ PO TBCR: 40.0000 meq | EXTENDED_RELEASE_TABLET | Freq: Every day | ORAL | 3 refills | Status: DC

## 2017-08-11 NOTE — Patient Instructions (Signed)
Almyra Deforest, PA-c has recommended making the following medication changes: 1. INCREASE Potassium to 40 mEq TWICE daily A new prescription has been sent to your preferred pharmacy. Please ignore your old bottles and follow the instructions on the new prescription.  Your physician recommends that you return for lab work next week, 08/18/17.  Misty Blackwell recommends that you schedule a follow-up appointment in 3-4 months with Dr Sallyanne Kuster.  If you need a refill on your cardiac medications before your next appointment, please call your pharmacy.

## 2017-08-11 NOTE — Progress Notes (Signed)
Cardiology Office Note    Date:  08/11/2017   ID:  NATSUMI WHITSITT, DOB 1928/03/29, MRN [id number]  PCP:  [name], MD  Cardiologist:  Dr. Sallyanne Kuster   Chief Complaint  Patient presents with  . Follow-up    seen for Dr. Sallyanne Kuster. 1 month return visit after echo and myoview    History of Present Illness:  KEILAH LEMIRE is a 84 y.o. female with PMH of permanent atrial fibrillation with slow ventricular response, s/p pacemaker, remote TIA, hypertension, minor CAD, asymptomatic NSVT, and a history of diastolic heart failure.  She has chest pain and shortness of breath that seems to respond to any motion and also physical stress as well.  Previous echo and Myoview in 2016 were normal, although Myoview at that time showed fixed moderate apical defect but without reversible ischemia.  Echo showed moderate tricuspid regurgitation and moderate pulmonary hypertension with PA peak pressure 58 mmHg.  More recently, patient was seen by Dr. Sallyanne Kuster on 07/26/2017, she had improvement after diuretic was increased recently.  Potassium was also increased due to hypokalemia as well.  Patient presents today for cardiology office visit.  Apparently she continued to take potassium at 30 mEq on a daily basis instead of the instructed 40 mg daily.  Otherwise, she has not needed any further nitroglycerin since the last office visit.  She still occasionally notice some pressure in her chest, however she says is likely related to anxiety.  She has some baseline dyspnea on exertion, this has been chronic and unchanged in the past few months.  Echocardiogram shows that her PA peak pressure has decreased somewhat compared to the previous echo in 2016.  On physical exam, however she appears to be euvolemic without any lower extremity edema, she denies any obvious PND as well.  Despite the elevated pulmonary arterial pressure seen on the recent echocardiogram, I do not see any sign of volume overload.  If her breathing  gets worse down the road, may consider right heart cath to see if she has baseline pulmonary arterial hypertension.  Otherwise, patient is stable from cardiology perspective.  I have instructed her to take 40 mEq daily of potassium given the recent hypokalemia, we plan to obtain outpatient basic metabolic panel in 1 week to assess the potassium level.  Otherwise she can see Dr. Sallyanne Kuster in 3-4 months.   Past Medical History:  Diagnosis Date  . Atrial fib/flutter, transient   . Atrial fibrillation, chronic ([place])   . CHF (congestive heart failure) ([place]) 10/29/2009   Echo - EF >55%; normal LV size and systolic function; unable to assess diastolic fcn due to E/A fusion, pulmonary vein flow pattern suggests elevated filling pressure; marked biatrail dilation, mild/mod tricuspid regurgitation; mod pulmonary htn; mild/mod mitral regurgitation; although echocardiographic features are incomplete findings suggest possible infiltrative cardiomyopathy (maybe amyloidosi  . Coronary artery disease 03/19/2002   R/P Cardiolite - EF 76%; nromal static and dynamic myocardial perfusion images; normal wall motion and endocardial thickening in all vascular territories  . Facial numbness 12/26/2008   carotid doppler - R and L ICAs 0-49% diameter reduction (velocities suggest low end of scale)  . Hypertension   . Pacemaker   . Peripheral neuropathy   . Skin cancer    s/p surgical removal.  . TIA (transient ischemic attack)     Past Surgical History:  Procedure Laterality Date  . ABDOMINAL HYSTERECTOMY    . APPENDECTOMY    . CARDIAC CATHETERIZATION  08/06/2005   minimal  coronary disease predominant RCA; no significant atherosclerosis; new onset sick sinus syndrome and atrial flutter w/ ventricular response, controlled on med therapy; systemic HTN, normal renal arteries  . CARDIOVERSION  11/19/2009   successful DCCV from AF to sinus type rhythm  . CHOLECYSTECTOMY    . Skin cancer resection      Current  Medications: Outpatient Medications Prior to Visit  Medication Sig Dispense Refill  . ALPRAZolam (XANAX) 1 MG tablet TAKE 1/2 TO 1 TABLET 2 TO 3 TIMES DAILY ONLY IF NEEDED FOR ACUTE ANXIETY ATTACK. LIMIT USE TO 5 DAYS A WEEK TO AVOID ADDICTION 90 tablet 0  . Ascorbic Acid (VITAMIN C PO) Take 1 tablet by mouth daily.    Marland Kitchen BIOTIN PO Take 1 tablet by mouth daily.    . Cholecalciferol (VITAMIN D3) 3000 units TABS Take 1 tablet by mouth daily.    . Cyanocobalamin (VITAMIN B 12 PO) Take 1 tablet by mouth daily.    Marland Kitchen diltiazem (TAZTIA XT) 240 MG 24 hr capsule Take 1 capsule (240 mg total) by mouth daily. 30 capsule 11  . furosemide (LASIX) 40 MG tablet Take 1 tablet (40 mg total) by mouth daily. 90 tablet 3  . IRON PO Take 1 tablet by mouth daily.    Marland Kitchen MAGNESIUM PO Take 1 tablet by mouth daily.    . Multiple Vitamins-Minerals (ZINC PO) Take 1 tablet by mouth daily.    . nitroGLYCERIN (NITROSTAT) 0.4 MG SL tablet Place 1 tablet (0.4 mg total) under the tongue every 5 (five) minutes as needed for chest pain. 25 tablet 3  . telmisartan (MICARDIS) 80 MG tablet Take 1/2 to 1 tablet every morning for BP & Heart 90 tablet 1  . warfarin (COUMADIN) 3 MG tablet TAKE 1 TO 1 AND 1/2 TABLET BY MOUTH TABLETS AS DIRECTED 135 tablet 0  . potassium chloride SA (K-DUR,KLOR-CON) 20 MEQ tablet Take 2 tablets (40 mEq total) by mouth daily. 180 tablet 3  . cholecalciferol (VITAMIN D) 1000 UNITS tablet Take 1,000 Units by mouth 3 (three) times daily.      No facility-administered medications prior to visit.      Allergies:   Latex; Ace inhibitors; Acrylic polymer [carbomer]; Augmentin [amoxicillin-pot clavulanate]; Ciprofloxacin; Levaquin [levofloxacin in d5w]; Zocor [simvastatin]; Chocolate; and Gabapentin   Social History   Socioeconomic History  . Marital status: Married    Spouse name: Not on file  . Number of children: 0  . Years of education: Not on file  . Highest education level: Not on file  Occupational  History  . Not on file  Social Needs  . Financial resource strain: Not on file  . Food insecurity:    Worry: Not on file    Inability: Not on file  . Transportation needs:    Medical: Not on file    Non-medical: Not on file  Tobacco Use  . Smoking status: Never Smoker  . Smokeless tobacco: Never Used  Substance and Sexual Activity  . Alcohol use: No  . Drug use: No  . Sexual activity: Never  Lifestyle  . Physical activity:    Days per week: Not on file    Minutes per session: Not on file  . Stress: Not on file  Relationships  . Social connections:    Talks on phone: Not on file    Gets together: Not on file    Attends religious service: Not on file    Active member of club or organization: Not on file  Attends meetings of clubs or organizations: Not on file    Relationship status: Not on file  Other Topics Concern  . Not on file  Social History Narrative   Widowed.  Lives alone.  Ambulates independently.     Family History:  The patient's family history includes Cirrhosis in her brother; Diabetes in her mother; Heart attack in her father; Heart disease in her father and mother.   ROS:   Please see the history of present illness.    ROS All other systems reviewed and are negative.   PHYSICAL EXAM:   VS:  BP 140/78   Pulse 80   Ht 5' 4.5" (1.638 m)   Wt 149 lb 3.2 oz (67.7 kg)   BMI 25.21 kg/m    GEN: Well nourished, well developed, in no acute distress  HEENT: normal  Neck: no JVD, carotid bruits, or masses Cardiac: RRR; no murmurs, rubs, or gallops,no edema  Respiratory:  clear to auscultation bilaterally, normal work of breathing GI: soft, nontender, nondistended, + BS MS: no deformity or atrophy  Skin: warm and dry, no rash Neuro:  Alert and Oriented x 3, Strength and sensation are intact Psych: euthymic mood, full affect  Wt Readings from Last 3 Encounters:  08/11/17 149 lb 3.2 oz (67.7 kg)  08/05/17 151 lb (68.5 kg)  07/26/17 150 lb (68 kg)       Studies/Labs Reviewed:   EKG:  EKG is not ordered today.   Recent Labs: 07/25/2017: ALT 16; BUN 19; Creat 0.86; Hemoglobin 15.0; Magnesium 2.1; Platelets 256; Potassium 3.4; Sodium 142; TSH 0.71   Lipid Panel    Component Value Date/Time   CHOL 188 07/25/2017 0949   TRIG 157 (H) 07/25/2017 0949   HDL 51 07/25/2017 0949   CHOLHDL 3.7 07/25/2017 0949   VLDL 34 (H) 09/28/2016 1125   LDLCALC 109 (H) 07/25/2017 0949    Additional studies/ records that were reviewed today include:   Cath 08/2005 CATHETERIZATION DIAGNOSIS:  1.  New onset sick sinus syndrome - history of palpitations and tachycardia.  2.  New onset atrial flutter with ventricular response controlled on medical      therapy in the hospital.  3.  Recent laparoscopic cholecystectomy.  4.  Systemic hypertension, normal renal arteries.  5.  Minimal coronary disease predominant right coronary artery.  6.  Systolic compression with myocardial bridge junction of the mid and      distal third of the left anterior descending, no significant      atherosclerosis, recommend medical therapy.    Echo 08/03/2017 LV EF: 55% Study Conclusions  - Left ventricle: The cavity size was normal. Wall thickness was   increased in a pattern of mild LVH. The estimated ejection   fraction was 55%. Wall motion was normal; there were no regional   wall motion abnormalities. Indeterminant diastolic function. - Aortic valve: There was no stenosis. There was mild   regurgitation. - Mitral valve: Mildly calcified annulus. There was trivial   regurgitation. - Left atrium: The atrium was mildly dilated. - Right ventricle: The cavity size was normal. Pacer wire or   catheter noted in right ventricle. Systolic function was mildly   reduced. - Right atrium: The atrium was mildly dilated. - Tricuspid valve: Peak RV-RA gradient (S): 41 mm Hg. - Pulmonary arteries: PA peak pressure: 44 mm Hg (S). - Inferior vena cava: The vessel was normal in  size. The   respirophasic diameter changes were in the normal range (>= 50%),  consistent with normal central venous pressure.  Impressions:  - The patient appeared to be in atrial fibrillation. Normal LV size   with mild LV hypertrophy. EF 55%. Normal RV size with mildly   reduced systolic function. Mild pulmonary hypertension. Mild   aortic insufficiency.    Myoview 08/05/2017 Study Highlights    Normal perfusion  Nuclear stress EF: 70%.  The study is normal.      ASSESSMENT:    1. Permanent atrial fibrillation ([place])   2. Pacemaker   3. Essential hypertension   4. Coronary artery disease involving native coronary artery of native heart without angina pectoris   5. Chronic diastolic heart failure ([facility])      PLAN:  In order of problems listed above:  1. Permanent atrial fibrillation: History of slow ventricular response, status post pacemaker.  Continue Coumadin.  Next Coumadin check 09/06/2017.  2. Chronic diastolic heart failure: Despite recent echocardiogram showing mildly elevated pulmonary arterial pressure, however patient appears to be euvolemic.  Pulmonary arterial pressure actually improved compared to previous echocardiogram in 2016.  She has some baseline dyspnea on exertion that has been unchanged for the past several months, if her breathing gets worse, we may need to consider right heart cath in the future.  3. CAD: Last cardiac catheterization in April 2007 minimal coronary artery disease, predominant RCA.  There were systolic compression with myocardial bridge junction of mid and distal third of LAD, no significant atherosclerosis.  Medical therapy was recommended.  4. Hypertension: Blood pressure stable on current medication  5. Hypokalemia: Potassium low, she was originally instructed to increase potassium to 40 mEq daily, she has not done so since the last office visit.  We increased it again to 40 mEq daily today, she will need to return in 1 week for  basic metabolic panel.    Medication Adjustments/Labs and Tests Ordered: Current medicines are reviewed at length with the patient today.  Concerns regarding medicines are outlined above.  Medication changes, Labs and Tests ordered today are listed in the Patient Instructions below. Patient Instructions  Almyra Deforest, PA-c has recommended making the following medication changes: 1. INCREASE Potassium to 40 mEq TWICE daily A new prescription has been sent to your preferred pharmacy. Please ignore your old bottles and follow the instructions on the new prescription.  Your physician recommends that you return for lab work next week, 08/18/17.  Isaac Laud recommends that you schedule a follow-up appointment in 3-4 months with Dr Sallyanne Kuster.  If you need a refill on your cardiac medications before your next appointment, please call your pharmacy.    Hilbert Corrigan, [place]  08/11/2017 8:50 AM    [address], [place], [place]  [address] Phone: [phone]; Fax: [phone]

## 2017-08-12 NOTE — Progress Notes (Signed)
Thanks MCr 

## 2017-08-15 ENCOUNTER — Other Ambulatory Visit: Payer: Self-pay | Admitting: Adult Health

## 2017-08-16 ENCOUNTER — Other Ambulatory Visit: Payer: Self-pay

## 2017-08-16 MED ORDER — DILTIAZEM HCL ER BEADS 240 MG PO CP24: 240.0000 mg | ORAL_CAPSULE | Freq: Every day | ORAL | 4 refills | Status: DC

## 2017-08-18 LAB — BASIC METABOLIC PANEL
BUN / CREAT RATIO: 33 — AB (ref 12–28)
BUN: 27 mg/dL (ref 8–27)
CO2: 28 mmol/L (ref 20–29)
CREATININE: 0.82 mg/dL (ref 0.57–1.00)
Calcium: 9.6 mg/dL (ref 8.7–10.3)
Chloride: 102 mmol/L (ref 96–106)
GFR calc Af Amer: 74 mL/min/{1.73_m2} (ref >59)
GFR, EST NON AFRICAN AMERICAN: 65 mL/min/{1.73_m2} (ref >59)
Glucose: 79 mg/dL (ref 65–99)
Potassium: 5.2 mmol/L (ref 3.5–5.2)
SODIUM: 143 mmol/L (ref 134–144)

## 2017-08-25 ENCOUNTER — Other Ambulatory Visit: Payer: Self-pay

## 2017-09-06 ENCOUNTER — Ambulatory Visit (INDEPENDENT_AMBULATORY_CARE_PROVIDER_SITE_OTHER): Payer: Medicare Other | Admitting: Pharmacist

## 2017-09-06 DIAGNOSIS — I482 Chronic atrial fibrillation, unspecified: Secondary | ICD-10-CM

## 2017-09-06 DIAGNOSIS — Z7901 Long term (current) use of anticoagulants: Secondary | ICD-10-CM | POA: Diagnosis not present

## 2017-09-06 DIAGNOSIS — I4821 Permanent atrial fibrillation: Secondary | ICD-10-CM

## 2017-09-06 LAB — POCT INR: INR: 2

## 2017-10-03 ENCOUNTER — Other Ambulatory Visit: Payer: Self-pay | Admitting: Physician Assistant

## 2017-10-18 ENCOUNTER — Ambulatory Visit (INDEPENDENT_AMBULATORY_CARE_PROVIDER_SITE_OTHER): Payer: Medicare Other | Admitting: Pharmacist Clinician (PhC)/ Clinical Pharmacy Specialist

## 2017-10-18 DIAGNOSIS — Z7901 Long term (current) use of anticoagulants: Secondary | ICD-10-CM

## 2017-10-18 DIAGNOSIS — I482 Chronic atrial fibrillation, unspecified: Secondary | ICD-10-CM

## 2017-10-18 LAB — POCT INR: INR: 2.4 (ref 2.0–3.0)

## 2017-10-18 NOTE — Patient Instructions (Signed)
Description   Continue with 1.5 tablets daily except 1 tablet each Sunday, Tuesday and Thursday.  Repeat INR in 6 week

## 2017-10-19 ENCOUNTER — Telehealth: Payer: Self-pay | Admitting: Cardiology

## 2017-10-19 ENCOUNTER — Ambulatory Visit (INDEPENDENT_AMBULATORY_CARE_PROVIDER_SITE_OTHER): Payer: Medicare Other | Admitting: *Deleted

## 2017-10-19 DIAGNOSIS — I482 Chronic atrial fibrillation, unspecified: Secondary | ICD-10-CM

## 2017-10-19 NOTE — Progress Notes (Signed)
Remote pacemaker transmission.   

## 2017-10-19 NOTE — Telephone Encounter (Signed)
Spoke with pt and reminded pt of remote transmission that is due today. Pt verbalized understanding.   

## 2017-10-21 LAB — CUP PACEART REMOTE DEVICE CHECK
Battery Impedance: 1596 Ohm
Battery Remaining Longevity: 42 mo
Battery Voltage: 2.77 V
Date Time Interrogation Session: 20190619163031
Implantable Lead Implant Date: 20110428
Implantable Lead Location: 753859
Implantable Lead Model: 4092
Implantable Lead Model: 4592
Implantable Pulse Generator Implant Date: 20110428
Lead Channel Pacing Threshold Amplitude: 0.75 V
Lead Channel Pacing Threshold Pulse Width: 0.4 ms
Lead Channel Setting Pacing Amplitude: 2.5 V
Lead Channel Setting Pacing Pulse Width: 0.4 ms
Lead Channel Setting Sensing Sensitivity: 4 mV
MDC IDC LEAD IMPLANT DT: 20110428
MDC IDC LEAD LOCATION: 753860
MDC IDC MSMT LEADCHNL RA IMPEDANCE VALUE: 67 Ohm
MDC IDC MSMT LEADCHNL RV IMPEDANCE VALUE: 668 Ohm
MDC IDC STAT BRADY RV PERCENT PACED: 77 %

## 2017-11-10 ENCOUNTER — Ambulatory Visit (INDEPENDENT_AMBULATORY_CARE_PROVIDER_SITE_OTHER): Payer: Medicare Other | Admitting: *Deleted

## 2017-11-10 ENCOUNTER — Encounter: Payer: Self-pay | Admitting: Cardiovascular Disease

## 2017-11-10 ENCOUNTER — Ambulatory Visit (INDEPENDENT_AMBULATORY_CARE_PROVIDER_SITE_OTHER): Payer: Medicare Other | Admitting: Cardiovascular Disease

## 2017-11-10 ENCOUNTER — Telehealth: Payer: Self-pay | Admitting: Cardiovascular Disease

## 2017-11-10 VITALS — BP 134/70 | HR 93 | Ht 64.0 in | Wt 151.8 lb

## 2017-11-10 DIAGNOSIS — I482 Chronic atrial fibrillation, unspecified: Secondary | ICD-10-CM

## 2017-11-10 DIAGNOSIS — I10 Essential (primary) hypertension: Secondary | ICD-10-CM

## 2017-11-10 DIAGNOSIS — Z7901 Long term (current) use of anticoagulants: Secondary | ICD-10-CM | POA: Diagnosis not present

## 2017-11-10 DIAGNOSIS — I5032 Chronic diastolic (congestive) heart failure: Secondary | ICD-10-CM | POA: Diagnosis not present

## 2017-11-10 DIAGNOSIS — I4821 Permanent atrial fibrillation: Secondary | ICD-10-CM

## 2017-11-10 DIAGNOSIS — E782 Mixed hyperlipidemia: Secondary | ICD-10-CM

## 2017-11-10 DIAGNOSIS — Z950 Presence of cardiac pacemaker: Secondary | ICD-10-CM

## 2017-11-10 LAB — POCT INR: INR: 2.5 (ref 2.0–3.0)

## 2017-11-10 MED ORDER — DILTIAZEM HCL ER BEADS 180 MG PO CP24: 180.0000 mg | ORAL_CAPSULE | Freq: Every day | ORAL | 3 refills | Status: DC

## 2017-11-10 NOTE — Patient Instructions (Signed)
Dr Sallyanne Kuster has recommended making the following medication changes: 1. DECREASE Diltiazem to 180 mg daily  CALL THE OFFICE, [phone]. LET us KNOW HOW MUCH POTASSIUM YOU ARE TAKING.  Remote monitoring is used to monitor your Pacemaker or ICD from home. This monitoring reduces the number of office visits required to check your device to one time per year. It allows [place] to keep an eye on the functioning of your device to ensure it is working properly. You are scheduled for a device check from home on Wednesday, September 18th, 2019. You may send your transmission at any time that day. If you have a wireless device, the transmission will be sent automatically. After your physician reviews your transmission, you will receive a notification with your next transmission date.  To improve our patient care and to more adequately follow your device, [facility] has decided, as a practice, to start following each patient four times a year with your home monitor. This means that you may experience a remote appointment that is close to an in-office appointment with your physician. Your insurance will apply at the same rate as other remote monitoring transmissions.  Dr Sallyanne Kuster recommends that you schedule a follow-up appointment in 12 months with a pacemaker check. You will receive a reminder letter in the mail two months in advance. If you don't receive a letter, please call our office to schedule the follow-up appointment.  If you need a refill on your cardiac medications before your next appointment, please call your pharmacy.

## 2017-11-10 NOTE — Patient Instructions (Signed)
Description   Continue with 1.5 tablets daily except 1 tablet each Sunday, Tuesday and Thursday.  Repeat INR in 6 week

## 2017-11-10 NOTE — Telephone Encounter (Signed)
Pt calling about clarification of which dosage of Potassium she is taking-20 meq ER 1 qd-she was also to call back with the info on the med she is taking for her Neuropathy in her feet to see if it will interfere with her Coumadin. The name is Adult Stem Cell Activation and the main ingredient is Nanoparticles the company # is [phone]

## 2017-11-10 NOTE — Progress Notes (Signed)
[name], female   DOB: 29-May-1927, 84 y.o.   MRN: [id number]    Cardiology Office Note    Date:  11/10/2017   [name], DOB 05-Feb-1928, MRN [id number]  PCP:  [name], MD  Cardiologist:   Sanda Klein, MD   Chief Complaint  Patient presents with  . Atrial Fibrillation  . Pacemaker Check    History of Present Illness:  Misty Blackwell is a 84 y.o. female who presents for follow-up for shortness of breath and chest pressure that is occurring on a background of permanent atrial fibrillation with slow ventricular response and pacemaker, remote TIA, HTN, minor CAD, asymptomatic pacemaker-detected NSVT, history of diastolic HF.  She is doing well.  She has moved into a new single-story home.  She exercises for 30-40 minutes on a stationary bike every day.  This complaint is of neuropathy in her toes of the soles of her feet.  She is trying an over-the-counter "stem cell stimulator" preparation.  The patient specifically denies any chest pain at rest exertion, dyspnea at rest or with exertion, orthopnea, paroxysmal nocturnal dyspnea, syncope, palpitations, focal neurological deficits, intermittent claudication, lower extremity edema, unexplained weight gain, cough, hemoptysis or wheezing.  In 2016 she underwent echocardiography and nuclear stress testing.  Left ventricular systolic function was normal, she had a fixed moderate apical defect but without reversible ischemia.  Moderate tricuspid regurgitation and moderate pulmonary artery hypertension with estimated systolic PA pressure of 58 mmHg.  In April 2019 she had a nuclear stress test showing normal perfusion and an echocardiogram which showed unchanged findings of mild LVH, normal left ventricular systolic function, mild left atrial dilation, improved degree of pulmonary hypertension 41 mmHg.  Last pacemaker download just a couple weeks ago shows normal lead parameters, normal battery status and roughly  77% ventricular pacing.  Estimated generator longevity about 3-1/2 years.  Past Medical History:  Diagnosis Date  . Atrial fib/flutter, transient   . Atrial fibrillation, chronic ([place])   . CHF (congestive heart failure) ([place]) 10/29/2009   Echo - EF >55%; normal LV size and systolic function; unable to assess diastolic fcn due to E/A fusion, pulmonary vein flow pattern suggests elevated filling pressure; marked biatrail dilation, mild/mod tricuspid regurgitation; mod pulmonary htn; mild/mod mitral regurgitation; although echocardiographic features are incomplete findings suggest possible infiltrative cardiomyopathy (maybe amyloidosi  . Coronary artery disease 03/19/2002   R/P Cardiolite - EF 76%; nromal static and dynamic myocardial perfusion images; normal wall motion and endocardial thickening in all vascular territories  . Facial numbness 12/26/2008   carotid doppler - R and L ICAs 0-49% diameter reduction (velocities suggest low end of scale)  . Hypertension   . Pacemaker   . Peripheral neuropathy   . Skin cancer    s/p surgical removal.  . TIA (transient ischemic attack)     Past Surgical History:  Procedure Laterality Date  . ABDOMINAL HYSTERECTOMY    . APPENDECTOMY    . CARDIAC CATHETERIZATION  08/06/2005   minimal coronary disease predominant RCA; no significant atherosclerosis; new onset sick sinus syndrome and atrial flutter w/ ventricular response, controlled on med therapy; systemic HTN, normal renal arteries  . CARDIOVERSION  11/19/2009   successful DCCV from AF to sinus type rhythm  . CHOLECYSTECTOMY    . Skin cancer resection      Outpatient Medications Prior to Visit  Medication Sig Dispense Refill  . ALPRAZolam (XANAX) 1 MG tablet Take 1/2-1 tablet 2-3 x /day ONLY  if needed for Anxiety attacks & please try to limit to 5 days /week to avoid addiction 80 tablet 0  . Ascorbic Acid (VITAMIN C PO) Take 1 tablet by mouth daily.    Marland Kitchen BIOTIN PO Take 1 tablet by mouth daily.      . Cholecalciferol (VITAMIN D3) 3000 units TABS Take 1 tablet by mouth daily.    . Cyanocobalamin (VITAMIN B 12 PO) Take 1 tablet by mouth daily.    . furosemide (LASIX) 40 MG tablet Take 1 tablet (40 mg total) by mouth daily. 90 tablet 3  . IRON PO Take 1 tablet by mouth daily.    Marland Kitchen MAGNESIUM PO Take 1 tablet by mouth daily.    . Multiple Vitamins-Minerals (ZINC PO) Take 1 tablet by mouth daily.    . nitroGLYCERIN (NITROSTAT) 0.4 MG SL tablet Place 0.4 mg under the tongue every 5 (five) minutes as needed for chest pain.    . potassium chloride SA (K-DUR,KLOR-CON) 20 MEQ tablet Take 1 tablet (20 mEq total) by mouth daily. 90 tablet 3  . telmisartan (MICARDIS) 80 MG tablet Take 1/2 to 1 tablet every morning for BP & Heart 90 tablet 1  . warfarin (COUMADIN) 3 MG tablet TAKE 1 TO 1 AND 1/2 TABLET BY MOUTH TABLETS AS DIRECTED 135 tablet 0  . diltiazem (TAZTIA XT) 240 MG 24 hr capsule Take 1 capsule (240 mg total) by mouth daily. 30 capsule 4  . nitroGLYCERIN (NITROSTAT) 0.4 MG SL tablet Place 1 tablet (0.4 mg total) under the tongue every 5 (five) minutes as needed for chest pain. 25 tablet 3   No facility-administered medications prior to visit.      Allergies:   Latex; Ace inhibitors; Acrylic polymer [carbomer]; Augmentin [amoxicillin-pot clavulanate]; Ciprofloxacin; Levaquin [levofloxacin in d5w]; Zocor [simvastatin]; Chocolate; and Gabapentin   Social History   Socioeconomic History  . Marital status: Married    Spouse name: Not on file  . Number of children: 0  . Years of education: Not on file  . Highest education level: Not on file  Occupational History  . Not on file  Social Needs  . Financial resource strain: Not on file  . Food insecurity:    Worry: Not on file    Inability: Not on file  . Transportation needs:    Medical: Not on file    Non-medical: Not on file  Tobacco Use  . Smoking status: Never Smoker  . Smokeless tobacco: Never Used  Substance and Sexual Activity   . Alcohol use: No  . Drug use: No  . Sexual activity: Never  Lifestyle  . Physical activity:    Days per week: Not on file    Minutes per session: Not on file  . Stress: Not on file  Relationships  . Social connections:    Talks on phone: Not on file    Gets together: Not on file    Attends religious service: Not on file    Active member of club or organization: Not on file    Attends meetings of clubs or organizations: Not on file    Relationship status: Not on file  Other Topics Concern  . Not on file  Social History Narrative   Widowed.  Lives alone.  Ambulates independently.     Family History:  The patient's family history includes Cirrhosis in her brother; Diabetes in her mother; Heart attack in her father; Heart disease in her father and mother.   ROS:   Please  see the history of present illness.    ROS All other systems are reviewed and are negative   PHYSICAL EXAM:   VS:  BP 134/70   Pulse 93   Ht 5\' 4"  (1.626 m)   Wt 151 lb 12.8 oz (68.9 kg)   SpO2 96%   BMI 26.06 kg/m     General: Alert, oriented x3, no distress Head: no evidence of trauma, PERRL, EOMI, no exophtalmos or lid lag, no myxedema, no xanthelasma; normal ears, nose and oropharynx Neck: normal jugular venous pulsations and no hepatojugular reflux; brisk carotid pulses without delay and no carotid bruits Chest: clear to auscultation, no signs of consolidation by percussion or palpation, normal fremitus, symmetrical and full respiratory excursions Cardiovascular: normal position and quality of the apical impulse, regular rhythm, normal first and paradoxically split second heart sounds, no murmurs, rubs or gallops Abdomen: no tenderness or distention, no masses by palpation, no abnormal pulsatility or arterial bruits, normal bowel sounds, no hepatosplenomegaly Extremities: no clubbing, cyanosis or edema; 2+ radial, ulnar and brachial pulses bilaterally; 2+ right femoral, posterior tibial and dorsalis  pedis pulses; 2+ left femoral, posterior tibial and dorsalis pedis pulses; no subclavian or femoral bruits Neurological: grossly nonfocal Psych: Normal mood and affect   Wt Readings from Last 3 Encounters:  11/10/17 151 lb 12.8 oz (68.9 kg)  08/11/17 149 lb 3.2 oz (67.7 kg)  08/05/17 151 lb (68.5 kg)      Studies/Labs Reviewed:   EKG:  EKG is ordered today.  Has atrial fibrillation with mostly ventricular paced rhythm, but with a few ventricular sensed beats.  There is deep lateral T wave inversion.  Recent Labs: 07/25/2017: ALT 16; Hemoglobin 15.0; Magnesium 2.1; Platelets 256; TSH 0.71 08/18/2017: BUN 27; Creatinine, Ser 0.82; Potassium 5.2; Sodium 143   Lipid Panel    Component Value Date/Time   CHOL 188 07/25/2017 0949   TRIG 157 (H) 07/25/2017 0949   HDL 51 07/25/2017 0949   CHOLHDL 3.7 07/25/2017 0949   VLDL 34 (H) 09/28/2016 1125   LDLCALC 109 (H) 07/25/2017 0949     ASSESSMENT:    1. Permanent atrial fibrillation ([place])   2. Chronic diastolic heart failure ([place])   3. Essential hypertension   4. Long term current use of anticoagulant therapy   5. Pacemaker   6. Hyperlipidemia      PLAN:  In order of problems listed above:  1. AFib with slow ventricular response and high percentage of ventricular pacing.  I think we can try to cut back on the degree of ventricular pacing by reducing her dose of diltiazem.  She has high embolic risk with history of previous transient ischemic attack (CHADSVasc 8: age 34, TIA 2, HTN, CAD, CHF, gender).  Compliance with anticoagulation.   2. CHF: She had some problems with chest discomfort that improved with diuretic dose adjustment earlier this year.  It was probably related to increased sodium intake and stress surrounding her sister's illness. 3. HTN: Blood pressure control with current, reevaluate after coming back on the dose of diltiazem.  She will send [place] some blood pressure readings in a couple of weeks. 4. Warfarin: No  bleeding problems, compliant with follow-up. 5. PPM: She is not device dependent.  Remote download every 3 months and yearly office visits. 6. HLP: She could not tolerate statin therapy.  We have not really identified significant coronary disease do not justify aggressive treatment with PCSK9.  Continue lifestyle modifications.   Medication Adjustments/Labs and Tests Ordered:  Current medicines are reviewed at length with the patient today.  Concerns regarding medicines are outlined above.  Medication changes, Labs and Tests ordered today are listed in the Patient Instructions below. Patient Instructions  Dr Sallyanne Kuster has recommended making the following medication changes: 1. DECREASE Diltiazem to 180 mg daily  CALL THE OFFICE, [phone]. LET us KNOW HOW MUCH POTASSIUM YOU ARE TAKING.  Remote monitoring is used to monitor your Pacemaker or ICD from home. This monitoring reduces the number of office visits required to check your device to one time per year. It allows [place] to keep an eye on the functioning of your device to ensure it is working properly. You are scheduled for a device check from home on Wednesday, September 18th, 2019. You may send your transmission at any time that day. If you have a wireless device, the transmission will be sent automatically. After your physician reviews your transmission, you will receive a notification with your next transmission date.  To improve our patient care and to more adequately follow your device, [facility] has decided, as a practice, to start following each patient four times a year with your home monitor. This means that you may experience a remote appointment that is close to an in-office appointment with your physician. Your insurance will apply at the same rate as other remote monitoring transmissions.  Dr Sallyanne Kuster recommends that you schedule a follow-up appointment in 12 months with a pacemaker check. You will receive a reminder letter in  the mail two months in advance. If you don't receive a letter, please call our office to schedule the follow-up appointment.  If you need a refill on your cardiac medications before your next appointment, please call your pharmacy.      Signed, Sanda Klein, MD  11/10/2017 4:07 PM    [facility] [address] [place], [place], [place]  [address] Phone: [phone]; Fax: [phone]

## 2017-11-10 NOTE — Telephone Encounter (Signed)
Call returned to the patient. She stated that she was calling to give some information as instructed from her office visit today.  -She is currently taking 10 meq of potassium  -She is taking [email or web address]: stem cell activator. Number is [phone] and the website is [email or web address]

## 2017-11-11 NOTE — Telephone Encounter (Signed)
Thank you, can we please correct the dose on her med list? MCr

## 2017-11-14 MED ORDER — POTASSIUM CHLORIDE CRYS ER 10 MEQ PO TBCR: 10.0000 meq | EXTENDED_RELEASE_TABLET | Freq: Every day | ORAL | Status: DC

## 2017-11-21 DIAGNOSIS — E663 Overweight: Secondary | ICD-10-CM | POA: Insufficient documentation

## 2017-11-21 NOTE — Progress Notes (Signed)
[name], female   DOB: 03/10/1928, 84 y.o.   MRN: [id number]  MEDICARE ANNUAL WELLNESS VISIT AND FOLLOW UP  Assessment:    Diagnoses and all orders for this visit:  Encounter for Medicare annual wellness exam  Essential hypertension Continue medication Monitor blood pressure at home; call if consistently over 130/80 Continue DASH diet.   Reminder to go to the ER if any CP, SOB, nausea, dizziness, severe HA, changes vision/speech, left arm numbness and tingling and jaw pain.  Atherosclerosis of native coronary artery of native heart with angina pectoris ([place]) Control blood pressure, cholesterol, glucose, increase exercise.  Followed closely by cardiology No chest pains recently, had perfusion studies and ECHO in April 2019   Chronic diastolic heart failure (HCC) Weights stable, monitor, no symptoms at this time  Permanent atrial fibrillation ([place]) Followed by coumadin clinic Reminded to go to ER for any head injury, call with concerning signs of bleeding  Gastroesophageal reflux disease, esophagitis presence not specified Symptoms controlled at this time, lifestyle emphasized  Osteoarthritis, unspecified osteoarthritis type, unspecified site Mild in hands, declines treatment  Vitamin D deficiency At goal at recent check;  Defer vitamin D level  Prediabetes Recent A1Cs at goal Discussed diet/exercise, weight management  Defer A1C; check CMP  Pacemaker Followed by cardiology  Medication management CBC, CMP/GFR  Long term current use of anticoagulant therapy Followed by cardiology  Hyperlipidemia No longer on medication secondary to age Continue low cholesterol diet and exercise.  Check lipid panel.   Fibrocystic breast disease (FCBD), unspecified laterality Refuses further mammograms  Overweight (BMI 25.0-29.9) Continue to recommend diet heavy in fruits and veggies and low in animal meats, cheeses, and dairy products, appropriate calorie  intake Discuss exercise recommendations routinely Continue to monitor weight at each visit   Over 30 minutes of exam, counseling, chart review, and critical decision making was performed  Future Appointments  Date Time Provider Wise  12/22/2017 11:45 AM [facility]  01/18/2018 10:20 AM [facility] DEVICE REMOTES [facility]  02/22/2018 11:00 AM [name], MD [facility] None     Plan:   During the course of the visit the patient was educated and counseled about appropriate screening and preventive services including:    Pneumococcal vaccine   Influenza vaccine  Td vaccine  Prevnar 13  Screening electrocardiogram  Screening mammography  Bone densitometry screening  Colorectal cancer screening  Diabetes screening  Glaucoma screening  Nutrition counseling   Advanced directives: given info/requested copies    Subjective:   Misty Blackwell is a 84 y.o. female who presents for Medicare Annual Wellness Visit and 6 month follow up. Patient had negative /Nl Ht caths in x2 in 1993 & 2007. In 2003, her Myoview was Negative for Ischemia.  In 2006 , she had an embolic CVA with Rt hemifacial paresthesias (resolved) and was found in Afib and has been on Coumadin since - monitored at [facility] (last INR 2.5). She had a PPM implanted in 2007 for SSS and is followed by Dr Sallyanne Kuster.She recently had perfusion studies and ECHO in April of 2019 with good results.   She has some problems with anxiety, was having panic attacks a few months ago but improved recently, she is prescribed xanax and takes 1/2 tab at night to sleep, and may take a 1/2 tab in the afternoon, takes 1-2 time per week, as needed only.   BMI is Body mass index is 26.26 kg/m., she has been working  on diet and exercise. Wt Readings from Last 3 Encounters:  11/22/17 153 lb (69.4 kg)  11/10/17 151 lb 12.8 oz (68.9 kg)  08/11/17 149 lb 3.2 oz  (67.7 kg)   Her blood pressure has been controlled at home, today their BP is BP: 118/64  She does not workout. She denies chest pain, shortness of breath, dizziness.   She is not on cholesterol medication secondary to age. Her cholesterol is not at goal. The cholesterol last visit was:   Lab Results  Component Value Date   CHOL 188 07/25/2017   HDL 51 07/25/2017   LDLCALC 109 (H) 07/25/2017   TRIG 157 (H) 07/25/2017   CHOLHDL 3.7 07/25/2017   Last A1C in the office was:  Lab Results  Component Value Date   HGBA1C 5.6 07/25/2017   Patient is on Vitamin D supplement.   Lab Results  Component Value Date   VD25OH 57 07/25/2017      Medication Review Current Outpatient Medications on File Prior to Visit  Medication Sig Dispense Refill  . ALPRAZolam (XANAX) 1 MG tablet Take 1/2-1 tablet 2-3 x /day ONLY if needed for Anxiety attacks & please try to limit to 5 days /week to avoid addiction 80 tablet 0  . Ascorbic Acid (VITAMIN C PO) Take 1 tablet by mouth daily.    Marland Kitchen BIOTIN PO Take 1 tablet by mouth daily.    . Cholecalciferol (VITAMIN D3) 3000 units TABS Take 1 tablet by mouth daily.    . Cyanocobalamin (VITAMIN B 12 PO) Take 1 tablet by mouth daily.    Marland Kitchen diltiazem (TIAZAC) 180 MG 24 hr capsule Take 1 capsule (180 mg total) by mouth daily. 90 capsule 3  . furosemide (LASIX) 40 MG tablet Take 1 tablet (40 mg total) by mouth daily. 90 tablet 3  . IRON PO Take 1 tablet by mouth daily.    Marland Kitchen MAGNESIUM PO Take 1 tablet by mouth daily.    . Multiple Vitamins-Minerals (ZINC PO) Take 1 tablet by mouth daily.    . nitroGLYCERIN (NITROSTAT) 0.4 MG SL tablet Place 0.4 mg under the tongue every 5 (five) minutes as needed for chest pain.    . potassium chloride SA (K-DUR,KLOR-CON) 10 MEQ tablet Take 1 tablet (10 mEq total) by mouth daily.    Marland Kitchen telmisartan (MICARDIS) 80 MG tablet Take 1/2 to 1 tablet every morning for BP & Heart 90 tablet 1  . warfarin (COUMADIN) 3 MG tablet TAKE 1 TO 1 AND 1/2  TABLET BY MOUTH TABLETS AS DIRECTED (Patient taking differently: 3 mg. Take 1.5 tablets on M,W,F,Sat and 1 tablet on T, Th, Sat) 135 tablet 0   No current facility-administered medications on file prior to visit.     Current Problems (verified) Patient Active Problem List   Diagnosis Date Noted  . Anxiety 11/22/2017  . Overweight (BMI 25.0-29.9) 11/21/2017  . Chronic diastolic heart failure ([place]) 11/10/2017  . Prediabetes 08/15/2013  . Vitamin D deficiency 08/15/2013  . Medication management 08/15/2013  . Pacemaker 09/19/2012  . Atrial fibrillation ([place]) 07/18/2012  . Long term current use of anticoagulant therapy 07/18/2012  . Hyperlipidemia 09/03/2008  . Essential hypertension 09/03/2008  . Coronary atherosclerosis 09/03/2008  . GERD 09/03/2008  . FIBROCYSTIC BREAST DISEASE 09/03/2008  . Osteoarthritis 09/03/2008    Screening Tests Immunization History  Administered Date(s) Administered  . DT 03/19/2015  . Influenza Split 05/04/2011  . Influenza, High Dose Seasonal PF 03/19/2014, 12/23/2015  . Influenza,inj,quad, With Preservative 05/21/2013  .  Influenza-Unspecified 02/04/2015, 02/03/2017  . Pneumococcal Conjugate-13 03/19/2014  . Pneumococcal Polysaccharide-23 05/04/2011  . Pneumococcal-Unspecified 05/03/2001  . Td 05/04/2003    Preventative care: Last colonoscopy: 2010 Last mammogram: refused DEXA declines  Prior vaccinations: TD or Tdap: 2016  Influenza: 2018  Pneumococcal: 2003 Prevnar13: 2015  Shingles/Zostavax: Declined  Names of Other Physician/Practitioners you currently use: 1.  [facility]- here for primary care 2. Dr. Katy Fitch , eye doctor, last visit 2019 3. Dr. Ronnald Ramp, dentist, last visit 2019  Patient Care Team: [name], MD as PCP - General (Internal Medicine) [name], MD as Consulting Physician (Urology) Penni Bombard, MD as Consulting Physician (Neurology) [name], MD as  Consulting Physician (Neurology) Sanda Klein, MD as Consulting Physician (Cardiology) [name], MD (Inactive) as Consulting Physician (Gastroenterology)  Allergies Allergies  Allergen Reactions  . Latex Itching  . Ace Inhibitors     Unknown   . Acrylic Polymer [Carbomer]   . Augmentin [Amoxicillin-Pot Clavulanate]   . Ciprofloxacin   . Levaquin [Levofloxacin In D5w]   . Zocor [Simvastatin]   . Chocolate Other (See Comments)    migraine's   . Gabapentin Other (See Comments)    Unsteady gait     SURGICAL HISTORY She  has a past surgical history that includes Cardioversion (11/19/2009); Cardiac catheterization (08/06/2005); Cholecystectomy; Appendectomy; Abdominal hysterectomy; and Skin cancer resection. FAMILY HISTORY Her family history includes Cirrhosis in her brother; Diabetes in her mother; Heart attack in her father; Heart disease in her father and mother. SOCIAL HISTORY She  reports that she has never smoked. She has never used smokeless tobacco. She reports that she does not drink alcohol or use drugs.  MEDICARE WELLNESS OBJECTIVES: Physical activity:   Cardiac risk factors:   Depression/mood screen:   Depression screen [facility] 2/9 11/22/2017  Decreased Interest 0  Down, Depressed, Hopeless 0  PHQ - 2 Score 0  Altered sleeping -  Tired, decreased energy -  Change in appetite -  Feeling bad or failure about yourself  -  Trouble concentrating -  Moving slowly or fidgety/restless -  Suicidal thoughts -  PHQ-9 Score -  Difficult doing work/chores -    ADLs:  In your present state of health, do you have any difficulty performing the following activities: 07/24/2017 01/06/2017  Hearing? N N  Vision? N N  Difficulty concentrating or making decisions? N N  Walking or climbing stairs? N N  Dressing or bathing? N -  Doing errands, shopping? N N  Some recent data might be hidden     Cognitive Testing  Alert? Yes  Normal Appearance?Yes  Oriented to person? Yes   Place? Yes   Time? Yes  Recall of three objects?  Yes  Can perform simple calculations? Yes  Displays appropriate judgment?Yes  Can read the correct time from a watch face?Yes  EOL planning: Does Patient Have a Medical Advance Directive?: Yes Type of Advance Directive: Healthcare Power of Attorney, Living will Does patient want to make changes to medical advance directive?: No - Patient declined Copy of [place] in Chart?: No - copy requested   Objective:   Today's Vitals   11/22/17 1119  BP: 118/64  Pulse: 70  Temp: (!) 97.5 F (36.4 C)  SpO2: 94%  Weight: 153 lb (69.4 kg)  Height: 5\' 4"  (1.626 m)   Body mass index is 26.26 kg/m.  General appearance: alert, no distress, WD/WN,  female HEENT: normocephalic, sclerae anicteric, TMs pearly, nares  patent, no discharge or erythema, pharynx normal, hearing normal with bilateral hearing aids in  Oral cavity: MMM, no lesions Neck: supple, no lymphadenopathy, no thyromegaly, no masses Heart: RRR, normal S1, S2, no murmurs Lungs: CTA bilaterally, no wheezes, rhonchi, or rales Abdomen: +bs, soft, non tender, non distended, no masses, no hepatomegaly, no splenomegaly Musculoskeletal: nontender, no swelling, no obvious deformity Extremities: no edema, no cyanosis, no clubbing Pulses: 2+ symmetric, upper and lower extremities, normal cap refill Neurological: alert, oriented x 3, CN2-12 intact, strength normal upper extremities and lower extremities, sensation normal throughout, DTRs 2+ throughout, no cerebellar signs, gait normal Psychiatric: normal affect, behavior normal, pleasant  Breast: defer Gyn: defer Rectal: defer   Medicare Attestation I have personally reviewed: The patient's medical and social history Their use of alcohol, tobacco or illicit drugs Their current medications and supplements The patient's functional ability including ADLs,fall risks, home safety risks, cognitive, and hearing and visual  impairment Diet and physical activities Evidence for depression or mood disorders  The patient's weight, height, BMI, and visual acuity have been recorded in the chart.  I have made referrals, counseling, and provided education to the patient based on review of the above and I have provided the patient with a written personalized care plan for preventive services.     Misty Ribas, NP   11/22/2017

## 2017-11-22 ENCOUNTER — Ambulatory Visit (INDEPENDENT_AMBULATORY_CARE_PROVIDER_SITE_OTHER): Payer: Medicare Other | Admitting: Adult Health

## 2017-11-22 ENCOUNTER — Encounter: Payer: Self-pay | Admitting: Adult Health

## 2017-11-22 VITALS — BP 118/64 | HR 70 | Temp 97.5°F | Ht 64.0 in | Wt 153.0 lb

## 2017-11-22 DIAGNOSIS — Z79899 Other long term (current) drug therapy: Secondary | ICD-10-CM | POA: Diagnosis not present

## 2017-11-22 DIAGNOSIS — I25119 Atherosclerotic heart disease of native coronary artery with unspecified angina pectoris: Secondary | ICD-10-CM

## 2017-11-22 DIAGNOSIS — Z950 Presence of cardiac pacemaker: Secondary | ICD-10-CM

## 2017-11-22 DIAGNOSIS — E663 Overweight: Secondary | ICD-10-CM

## 2017-11-22 DIAGNOSIS — E559 Vitamin D deficiency, unspecified: Secondary | ICD-10-CM

## 2017-11-22 DIAGNOSIS — R7303 Prediabetes: Secondary | ICD-10-CM | POA: Diagnosis not present

## 2017-11-22 DIAGNOSIS — F419 Anxiety disorder, unspecified: Secondary | ICD-10-CM

## 2017-11-22 DIAGNOSIS — I4821 Permanent atrial fibrillation: Secondary | ICD-10-CM

## 2017-11-22 DIAGNOSIS — I5032 Chronic diastolic (congestive) heart failure: Secondary | ICD-10-CM

## 2017-11-22 DIAGNOSIS — N6019 Diffuse cystic mastopathy of unspecified breast: Secondary | ICD-10-CM

## 2017-11-22 DIAGNOSIS — I482 Chronic atrial fibrillation: Secondary | ICD-10-CM

## 2017-11-22 DIAGNOSIS — M1990 Unspecified osteoarthritis, unspecified site: Secondary | ICD-10-CM

## 2017-11-22 DIAGNOSIS — K219 Gastro-esophageal reflux disease without esophagitis: Secondary | ICD-10-CM | POA: Diagnosis not present

## 2017-11-22 DIAGNOSIS — Z7901 Long term (current) use of anticoagulants: Secondary | ICD-10-CM

## 2017-11-22 DIAGNOSIS — Z0000 Encounter for general adult medical examination without abnormal findings: Secondary | ICD-10-CM

## 2017-11-22 DIAGNOSIS — Z0001 Encounter for general adult medical examination with abnormal findings: Secondary | ICD-10-CM | POA: Diagnosis not present

## 2017-11-22 DIAGNOSIS — I10 Essential (primary) hypertension: Secondary | ICD-10-CM

## 2017-11-22 DIAGNOSIS — R6889 Other general symptoms and signs: Secondary | ICD-10-CM | POA: Diagnosis not present

## 2017-11-22 DIAGNOSIS — E782 Mixed hyperlipidemia: Secondary | ICD-10-CM

## 2017-11-22 LAB — COMPLETE METABOLIC PANEL WITH GFR
AG RATIO: 1.5 (calc) (ref 1.0–2.5)
ALT: 17 U/L (ref 6–29)
AST: 20 U/L (ref 10–35)
Albumin: 4.3 g/dL (ref 3.6–5.1)
Alkaline phosphatase (APISO): 112 U/L (ref 33–130)
BILIRUBIN TOTAL: 0.6 mg/dL (ref 0.2–1.2)
BUN/Creatinine Ratio: 18 (calc) (ref 6–22)
BUN: 17 mg/dL (ref 7–25)
CHLORIDE: 99 mmol/L (ref 98–110)
CO2: 29 mmol/L (ref 20–32)
Calcium: 9.6 mg/dL (ref 8.6–10.4)
Creat: 0.92 mg/dL — ABNORMAL HIGH (ref 0.60–0.88)
GFR, EST AFRICAN AMERICAN: 64 mL/min/{1.73_m2} (ref >=60)
GFR, Est Non African American: 56 mL/min/{1.73_m2} — ABNORMAL LOW (ref >=60)
GLUCOSE: 79 mg/dL (ref 65–99)
Globulin: 2.8 g/dL (calc) (ref 1.9–3.7)
POTASSIUM: 4.2 mmol/L (ref 3.5–5.3)
Sodium: 141 mmol/L (ref 135–146)
Total Protein: 7.1 g/dL (ref 6.1–8.1)

## 2017-11-22 LAB — CBC WITH DIFFERENTIAL/PLATELET
Basophils Absolute: 51 cells/uL (ref 0–200)
Basophils Relative: 0.6 %
EOS ABS: 94 {cells}/uL (ref 15–500)
Eosinophils Relative: 1.1 %
HCT: 43.6 % (ref 35.0–45.0)
Hemoglobin: 14.4 g/dL (ref 11.7–15.5)
Lymphs Abs: 3196 cells/uL (ref 850–3900)
MCH: 30.8 pg (ref 27.0–33.0)
MCHC: 33 g/dL (ref 32.0–36.0)
MCV: 93.2 fL (ref 80.0–100.0)
MONOS PCT: 10.2 %
MPV: 11.2 fL (ref 7.5–12.5)
NEUTROS PCT: 50.5 %
Neutro Abs: 4293 cells/uL (ref 1500–7800)
PLATELETS: 239 10*3/uL (ref 140–400)
RBC: 4.68 10*6/uL (ref 3.80–5.10)
RDW: 12 % (ref 11.0–15.0)
TOTAL LYMPHOCYTE: 37.6 %
WBC: 8.5 10*3/uL (ref 3.8–10.8)
WBCMIX: 867 {cells}/uL (ref 200–950)

## 2017-11-22 LAB — LIPID PANEL
Cholesterol: 185 mg/dL (ref <200)
HDL: 44 mg/dL — AB (ref >50)
LDL Cholesterol (Calc): 109 mg/dL (calc) — ABNORMAL HIGH
Non-HDL Cholesterol (Calc): 141 mg/dL (calc) — ABNORMAL HIGH (ref <130)
TRIGLYCERIDES: 208 mg/dL — AB (ref <150)
Total CHOL/HDL Ratio: 4.2 (calc) (ref <5.0)

## 2017-11-22 LAB — TSH: TSH: 1.3 m[IU]/L (ref 0.40–4.50)

## 2017-11-22 NOTE — Patient Instructions (Signed)
Can try melatonin 5mg-15 mg at night for sleep, can also do benadryl 25-50mg at night for sleep.  If this does not help we can try prescription medication.  Also here is some information about good sleep hygiene.   Insomnia Insomnia is frequent trouble falling and/or staying asleep. Insomnia can be a long term problem or a short term problem. Both are common. Insomnia can be a short term problem when the wakefulness is related to a certain stress or worry. Long term insomnia is often related to ongoing stress during waking hours and/or poor sleeping habits. Overtime, sleep deprivation itself can make the problem worse. Every little thing feels more severe because you are overtired and your ability to cope is decreased. CAUSES  Stress, anxiety, and depression. Poor sleeping habits. Distractions such as TV in the bedroom. Naps close to bedtime. Engaging in emotionally charged conversations before bed. Technical reading before sleep. Alcohol and other sedatives. They may make the problem worse. They can hurt normal sleep patterns and normal dream activity. Stimulants such as caffeine for several hours prior to bedtime. Pain syndromes and shortness of breath can cause insomnia. Exercise late at night. Changing time zones may cause sleeping problems (jet lag). It is sometimes helpful to have someone observe your sleeping patterns. They should look for periods of not breathing during the night (sleep apnea). They should also look to see how long those periods last. If you live alone or observers are uncertain, you can also be observed at a sleep clinic where your sleep patterns will be professionally monitored. Sleep apnea requires a checkup and treatment. Give your caregivers your medical history. Give your caregivers observations your family has made about your sleep.  SYMPTOMS  Not feeling rested in the morning. Anxiety and restlessness at bedtime. Difficulty falling and staying asleep. TREATMENT   Your caregiver may prescribe treatment for an underlying medical disorders. Your caregiver can give advice or help if you are using alcohol or other drugs for self-medication. Treatment of underlying problems will usually eliminate insomnia problems. Medications can be prescribed for short time use. They are generally not recommended for lengthy use. Over-the-counter sleep medicines are not recommended for lengthy use. They can be habit forming. You can promote easier sleeping by making lifestyle changes such as: Using relaxation techniques that help with breathing and reduce muscle tension. Exercising earlier in the day. Changing your diet and the time of your last meal. No night time snacks. Establish a regular time to go to bed. Counseling can help with stressful problems and worry. Soothing music and white noise may be helpful if there are background noises you cannot remove. Stop tedious detailed work at least one hour before bedtime. HOME CARE INSTRUCTIONS  Keep a diary. Inform your caregiver about your progress. This includes any medication side effects. See your caregiver regularly. Take note of: Times when you are asleep. Times when you are awake during the night. The quality of your sleep. How you feel the next day. This information will help your caregiver care for you. Get out of bed if you are still awake after 15 minutes. Read or do some quiet activity. Keep the lights down. Wait until you feel sleepy and go back to bed. Keep regular sleeping and waking hours. Avoid naps. Exercise regularly. Avoid distractions at bedtime. Distractions include watching television or engaging in any intense or detailed activity like attempting to balance the household checkbook. Develop a bedtime ritual. Keep a familiar routine of bathing, brushing your teeth,   climbing into bed at the same time each night, listening to soothing music. Routines increase the success of falling to sleep faster. Use  relaxation techniques. This can be using breathing and muscle tension release routines. It can also include visualizing peaceful scenes. You can also help control troubling or intruding thoughts by keeping your mind occupied with boring or repetitive thoughts like the old concept of counting sheep. You can make it more creative like imagining planting one beautiful flower after another in your backyard garden. During your day, work to eliminate stress. When this is not possible use some of the previous suggestions to help reduce the anxiety that accompanies stressful situations. MAKE SURE YOU:  Understand these instructions. Will watch your condition. Will get help right away if you are not doing well or get worse. Document Released: 04/16/2000 Document Revised: 07/12/2011 Document Reviewed: 05/17/2007 ExitCare Patient Information 2015 [employer]. This information is not intended to replace advice given to you by your health care provider. Make sure you discuss any questions you have with your health care provider.  

## 2017-11-23 ENCOUNTER — Other Ambulatory Visit: Payer: Self-pay | Admitting: Adult Health

## 2017-11-23 DIAGNOSIS — N183 Chronic kidney disease, stage 3 unspecified: Secondary | ICD-10-CM

## 2017-11-28 ENCOUNTER — Other Ambulatory Visit: Payer: Self-pay | Admitting: Internal Medicine

## 2017-12-21 ENCOUNTER — Ambulatory Visit (INDEPENDENT_AMBULATORY_CARE_PROVIDER_SITE_OTHER): Payer: Medicare Other | Admitting: *Deleted

## 2017-12-21 VITALS — BP 128/64 | HR 72 | Temp 97.9°F

## 2017-12-21 DIAGNOSIS — N183 Chronic kidney disease, stage 3 unspecified: Secondary | ICD-10-CM

## 2017-12-21 NOTE — Progress Notes (Signed)
Patient is here for a NV and BMET due to decreased kidney functions.  Patient reports she drinks approximately 80 ounces of water a day plus other drinks.  She states she does not take any NSAIDS.

## 2017-12-22 ENCOUNTER — Ambulatory Visit (INDEPENDENT_AMBULATORY_CARE_PROVIDER_SITE_OTHER): Payer: Medicare Other | Admitting: Pharmacist

## 2017-12-22 DIAGNOSIS — I482 Chronic atrial fibrillation, unspecified: Secondary | ICD-10-CM

## 2017-12-22 DIAGNOSIS — Z7901 Long term (current) use of anticoagulants: Secondary | ICD-10-CM

## 2017-12-22 LAB — POCT INR: INR: 2.2 (ref 2.0–3.0)

## 2017-12-22 LAB — BASIC METABOLIC PANEL WITH GFR
BUN: 16 mg/dL (ref 7–25)
CO2: 30 mmol/L (ref 20–32)
Calcium: 9.3 mg/dL (ref 8.6–10.4)
Chloride: 103 mmol/L (ref 98–110)
Creat: 0.87 mg/dL (ref 0.60–0.88)
GFR, EST AFRICAN AMERICAN: 69 mL/min/{1.73_m2} (ref >=60)
GFR, EST NON AFRICAN AMERICAN: 59 mL/min/{1.73_m2} — AB (ref >=60)
Glucose, Bld: 96 mg/dL (ref 65–99)
POTASSIUM: 4 mmol/L (ref 3.5–5.3)
Sodium: 141 mmol/L (ref 135–146)

## 2017-12-27 ENCOUNTER — Other Ambulatory Visit: Payer: Self-pay | Admitting: Cardiovascular Disease

## 2018-01-18 ENCOUNTER — Ambulatory Visit (INDEPENDENT_AMBULATORY_CARE_PROVIDER_SITE_OTHER): Payer: Medicare Other | Admitting: *Deleted

## 2018-01-18 DIAGNOSIS — I5032 Chronic diastolic (congestive) heart failure: Secondary | ICD-10-CM

## 2018-01-18 DIAGNOSIS — I10 Essential (primary) hypertension: Secondary | ICD-10-CM

## 2018-01-18 NOTE — Progress Notes (Signed)
Remote pacemaker transmission.   

## 2018-02-02 ENCOUNTER — Ambulatory Visit (INDEPENDENT_AMBULATORY_CARE_PROVIDER_SITE_OTHER): Payer: Medicare Other | Admitting: Pharmacist

## 2018-02-02 DIAGNOSIS — I482 Chronic atrial fibrillation, unspecified: Secondary | ICD-10-CM | POA: Diagnosis not present

## 2018-02-02 DIAGNOSIS — Z7901 Long term (current) use of anticoagulants: Secondary | ICD-10-CM | POA: Diagnosis not present

## 2018-02-02 LAB — POCT INR: INR: 2.5 (ref 2.0–3.0)

## 2018-02-06 ENCOUNTER — Other Ambulatory Visit: Payer: Self-pay | Admitting: Internal Medicine

## 2018-02-09 ENCOUNTER — Encounter: Payer: Self-pay | Admitting: Internal Medicine

## 2018-02-10 LAB — CUP PACEART REMOTE DEVICE CHECK
Battery Impedance: 1710 Ohm
Battery Remaining Longevity: 39 mo
Battery Voltage: 2.77 V
Date Time Interrogation Session: 20190918124031
Implantable Lead Implant Date: 20110428
Implantable Lead Location: 753859
Implantable Lead Location: 753860
Implantable Lead Model: 4092
Implantable Lead Model: 4592
Implantable Pulse Generator Implant Date: 20110428
Lead Channel Pacing Threshold Amplitude: 0.625 V
Lead Channel Pacing Threshold Pulse Width: 0.4 ms
Lead Channel Setting Sensing Sensitivity: 4 mV
MDC IDC LEAD IMPLANT DT: 20110428
MDC IDC MSMT LEADCHNL RA IMPEDANCE VALUE: 67 Ohm
MDC IDC MSMT LEADCHNL RV IMPEDANCE VALUE: 716 Ohm
MDC IDC SET LEADCHNL RV PACING AMPLITUDE: 2.5 V
MDC IDC SET LEADCHNL RV PACING PULSEWIDTH: 0.4 ms
MDC IDC STAT BRADY RV PERCENT PACED: 77 %

## 2018-02-22 ENCOUNTER — Ambulatory Visit (INDEPENDENT_AMBULATORY_CARE_PROVIDER_SITE_OTHER): Payer: Medicare Other | Admitting: Internal Medicine

## 2018-02-22 ENCOUNTER — Encounter: Payer: Self-pay | Admitting: Internal Medicine

## 2018-02-22 VITALS — BP 128/80 | HR 72 | Temp 97.3°F | Resp 16 | Ht 63.5 in | Wt 146.6 lb

## 2018-02-22 DIAGNOSIS — Z8249 Family history of ischemic heart disease and other diseases of the circulatory system: Secondary | ICD-10-CM

## 2018-02-22 DIAGNOSIS — Z79899 Other long term (current) drug therapy: Secondary | ICD-10-CM

## 2018-02-22 DIAGNOSIS — Z0000 Encounter for general adult medical examination without abnormal findings: Secondary | ICD-10-CM

## 2018-02-22 DIAGNOSIS — I2510 Atherosclerotic heart disease of native coronary artery without angina pectoris: Secondary | ICD-10-CM

## 2018-02-22 DIAGNOSIS — E559 Vitamin D deficiency, unspecified: Secondary | ICD-10-CM

## 2018-02-22 DIAGNOSIS — I5032 Chronic diastolic (congestive) heart failure: Secondary | ICD-10-CM

## 2018-02-22 DIAGNOSIS — R7309 Other abnormal glucose: Secondary | ICD-10-CM

## 2018-02-22 DIAGNOSIS — I482 Chronic atrial fibrillation, unspecified: Secondary | ICD-10-CM

## 2018-02-22 DIAGNOSIS — Z950 Presence of cardiac pacemaker: Secondary | ICD-10-CM

## 2018-02-22 DIAGNOSIS — R7303 Prediabetes: Secondary | ICD-10-CM

## 2018-02-22 DIAGNOSIS — K219 Gastro-esophageal reflux disease without esophagitis: Secondary | ICD-10-CM

## 2018-02-22 DIAGNOSIS — E782 Mixed hyperlipidemia: Secondary | ICD-10-CM

## 2018-02-22 DIAGNOSIS — Z1211 Encounter for screening for malignant neoplasm of colon: Secondary | ICD-10-CM

## 2018-02-22 DIAGNOSIS — Z0001 Encounter for general adult medical examination with abnormal findings: Secondary | ICD-10-CM

## 2018-02-22 DIAGNOSIS — Z1212 Encounter for screening for malignant neoplasm of rectum: Secondary | ICD-10-CM

## 2018-02-22 DIAGNOSIS — Z136 Encounter for screening for cardiovascular disorders: Secondary | ICD-10-CM | POA: Diagnosis not present

## 2018-02-22 DIAGNOSIS — I10 Essential (primary) hypertension: Secondary | ICD-10-CM | POA: Diagnosis not present

## 2018-02-22 MED ORDER — CLOTRIMAZOLE-BETAMETHASONE 1-0.05 % EX CREA: TOPICAL_CREAM | CUTANEOUS | 11 refills | Status: DC

## 2018-02-22 NOTE — Patient Instructions (Signed)

## 2018-02-22 NOTE — Progress Notes (Signed)
ADULT & ADOLESCENT INTERNAL MEDICINE [name], M.D.     Misty Blackwell. Silverio Lay, P.A.-C Liane Comber, [place] [address] [place] [address] Telephone [phone] Telefax [phone] Annual Screening/Preventative Visit & Comprehensive Evaluation &  Examination     This very nice 84 y.o. [name] presents for a Screening /Preventative Visit & comprehensive evaluation and management of multiple medical co-morbidities.  Patient has been followed for HTN, ch Afib, HLD, Prediabetes  and Vitamin D Deficiency. She has GERD is controlled with diet.        HTN predates since 1998. Patient's BP has been controlled at home.  Patient had Negative Heart caths x 2 in 1993 & 2007 and a Negative Myoview in 2003 and again more recently in April 2019. 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  She was dx'd w/Afib at that time & has been on Coumadin (CHA2DS2Vasc 8) since followed at [facility].  In 2007, she had a PPM implanted for SSS and she's followed by Dr Sallyanne Kuster. Patient denies any cardiac symptoms as chest pain, palpitations, shortness of breath, dizziness or ankle swelling. Today's BP is at goal - 128/80.      Patient's hyperlipidemia is controlled with diet and medications. Patient denies myalgias or other medication SE's. Last lipids were not at goal: Lab Results  Component Value Date   CHOL 185 11/22/2017   HDL 44 (L) 11/22/2017   LDLCALC 109 (H) 11/22/2017   TRIG 208 (H) 11/22/2017   CHOLHDL 4.2 11/22/2017      Patient has hx/o prediabetes / Insulin Resistance (A1c 5.9%/Insulin elevated  86 /2011)  and patient denies reactive hypoglycemic symptoms, visual blurring, diabetic polys or paresthesias. Last A1c was at goal: Lab Results  Component Value Date   HGBA1C 5.6 07/25/2017      Finally, patient has history of Vitamin D Deficiency  ("13"/2008)  and last Vitamin D was at goal: Lab Results  Component  Value Date   VD25OH 57 07/25/2017   Current Outpatient Medications on File Prior to Visit  Medication Sig  . ALPRAZolam (XANAX) 1 MG tablet TAKE 1/2 TO 1 TABLET BY MOUTH 2 TO 3 TIMES DAILY ONLY IF NEEDED FOR ANXIETY ATTACK. LIMIT USE TO 5 DAYS PER WEEK TO AVOID ADDICTION  . Ascorbic Acid (VITAMIN C PO) Take 1 tablet by mouth daily.  Marland Kitchen BIOTIN PO Take 1 tablet by mouth daily.  . Cholecalciferol (VITAMIN D3) 3000 units TABS Take 1 tablet by mouth daily.  . Cyanocobalamin (VITAMIN B 12 PO) Take 1 tablet by mouth daily.  Marland Kitchen diltiazem (TIAZAC) 180 MG 24 hr capsule Take 1 capsule (180 mg total) by mouth daily.  . furosemide (LASIX) 40 MG tablet Take 1 tablet (40 mg total) by mouth daily.  . IRON PO Take 1 tablet by mouth daily.  Marland Kitchen MAGNESIUM PO Take 1 tablet by mouth daily.  . Multiple Vitamins-Minerals (ZINC PO) Take 1 tablet by mouth daily.  . nitroGLYCERIN (NITROSTAT) 0.4 MG SL tablet Place 0.4 mg under the tongue every 5 (five) minutes as needed for chest pain.  . potassium chloride SA (K-DUR,KLOR-CON) 10 MEQ tablet Take 1 tablet (10 mEq total) by mouth daily.  Marland Kitchen warfarin (COUMADIN) 3 MG tablet TAKE 1 TO 1 AND 1/2 TABLET BY MOUTH TABLETS AS DIRECTED  . telmisartan (MICARDIS) 80 MG tablet Take 1/2 to 1 tablet every morning for BP & Heart   No current facility-administered medications on file  prior to visit.    Allergies  Allergen Reactions  . Latex Itching  . Ace Inhibitors     Unknown   . Acrylic Polymer [Carbomer]   . Augmentin [Amoxicillin-Pot Clavulanate]   . Ciprofloxacin   . Levaquin [Levofloxacin In D5w]   . Zocor [Simvastatin]   . Chocolate Other (See Comments)    migraine's   . Gabapentin Other (See Comments)    Unsteady gait    Past Medical History:  Diagnosis Date  . Atrial fib/flutter, transient   . Atrial fibrillation, chronic   . CHF (congestive heart failure) ([place]) 10/29/2009   Echo - EF >55%; normal LV size and systolic function; unable to assess diastolic fcn due  to E/A fusion, pulmonary vein flow pattern suggests elevated filling pressure; marked biatrail dilation, mild/mod tricuspid regurgitation; mod pulmonary htn; mild/mod mitral regurgitation; although echocardiographic features are incomplete findings suggest possible infiltrative cardiomyopathy (maybe amyloidosi  . Coronary artery disease 03/19/2002   R/P Cardiolite - EF 76%; nromal static and dynamic myocardial perfusion images; normal wall motion and endocardial thickening in all vascular territories  . Facial numbness 12/26/2008   carotid doppler - R and L ICAs 0-49% diameter reduction (velocities suggest low end of scale)  . Hypertension   . Pacemaker   . Peripheral neuropathy   . Skin cancer    s/p surgical removal.  . TIA (transient ischemic attack)    Health Maintenance  Topic Date Due  . DEXA SCAN  08/21/1994  . INFLUENZA VACCINE  12/01/2017  . TETANUS/TDAP  03/18/2024  . PNA vac Low Risk Adult  Completed   Immunization History  Administered Date(s) Administered  . DT 03/19/2015  . Influenza Split 05/04/2011  . Influenza, High Dose Seasonal PF 03/19/2014, 12/23/2015  . Influenza,inj,quad, With Preservative 05/21/2013  . Influenza-Unspecified 02/04/2015, 02/03/2017  . Pneumococcal Conjugate-13 03/19/2014  . Pneumococcal Polysaccharide-23 05/04/2011  . Pneumococcal-Unspecified 05/03/2001  . Td 05/04/2003   Last Colon -  Last MGM -  Past Surgical History:  Procedure Laterality Date  . ABDOMINAL HYSTERECTOMY    . APPENDECTOMY    . CARDIAC CATHETERIZATION  08/06/2005   minimal coronary disease predominant RCA; no significant atherosclerosis; new onset sick sinus syndrome and atrial flutter w/ ventricular response, controlled on med therapy; systemic HTN, normal renal arteries  . CARDIOVERSION  11/19/2009   successful DCCV from AF to sinus type rhythm  . CHOLECYSTECTOMY    . Skin cancer resection     Family History  Problem Relation Age of Onset  . Heart disease Mother   .  Diabetes Mother   . Heart attack Father   . Heart disease Father   . Cirrhosis Brother    Social History   Tobacco Use  . Smoking status: Never Smoker  . Smokeless tobacco: Never Used  Substance Use Topics  . Alcohol use: No  . Drug use: No    ROS Constitutional: Denies fever, chills, weight loss/gain, headaches, insomnia,  night sweats, and change in appetite. Does c/o fatigue. Eyes: Denies redness, blurred vision, diplopia, discharge, itchy, watery eyes.  ENT: Denies discharge, congestion, post nasal drip, epistaxis, sore throat, earache, hearing loss, dental pain, Tinnitus, Vertigo, Sinus pain, snoring.  Cardio: Denies chest pain, palpitations, irregular heartbeat, syncope, dyspnea, diaphoresis, orthopnea, PND, claudication, edema Respiratory: denies cough, dyspnea, DOE, pleurisy, hoarseness, laryngitis, wheezing.  Gastrointestinal: Denies dysphagia, heartburn, reflux, water brash, pain, cramps, nausea, vomiting, bloating, diarrhea, constipation, hematemesis, melena, hematochezia, jaundice, hemorrhoids Genitourinary: Denies dysuria, frequency, urgency, nocturia, hesitancy, discharge, hematuria, flank  pain Breast: Breast lumps, nipple discharge, bleeding.  Musculoskeletal: Denies arthralgia, myalgia, stiffness, Jt. Swelling, pain, limp, and strain/sprain. Denies falls. Skin: Denies puritis, rash, hives, warts, acne, eczema, changing in skin lesion Neuro: No weakness, tremor, incoordination, spasms, paresthesia, pain Psychiatric: Denies confusion, memory loss, sensory loss. Denies Depression. Endocrine: Denies change in weight, skin, hair change, nocturia, and paresthesia, diabetic polys, visual blurring, hyper / hypo glycemic episodes.  Heme/Lymph: No excessive bleeding, bruising, enlarged lymph nodes.  Physical Exam  BP 128/80   Pulse 72   Temp (!) 97.3 F (36.3 C)   Resp 16   Ht 5' 3.5" (1.613 m)   Wt 146 lb 9.6 oz (66.5 kg)   BMI 25.56 kg/m   General Appearance: Well  nourished, well groomed and in no apparent distress.  Eyes: PERRLA, EOMs, conjunctiva no swelling or erythema, normal fundi and vessels. Sinuses: No frontal/maxillary tenderness ENT/Mouth: EACs patent / TMs  nl. Nares clear without erythema, swelling, mucoid exudates. Oral hygiene is good. No erythema, swelling, or exudate. Tongue normal, non-obstructing. Tonsils not swollen or erythematous. Hearing normal.  Neck: Supple, thyroid not palpable. No bruits, nodes or JVD. Respiratory: Respiratory effort normal.  BS equal and clear bilateral without rales, rhonci, wheezing or stridor. Cardio: Heart sounds are normal with regular rate and rhythm and no murmurs, rubs or gallops. Peripheral pulses are normal and equal bilaterally without edema. No aortic or femoral bruits. Chest: symmetric with normal excursions and percussion. Breasts: Symmetric, without lumps, nipple discharge, retractions, or fibrocystic changes.  Abdomen: Flat, soft with bowel sounds active. Nontender, no guarding, rebound, hernias, masses, or organomegaly.  Lymphatics: Non tender without lymphadenopathy.  Genitourinary:  Musculoskeletal: Full ROM all peripheral extremities, joint stability, 5/5 strength, and normal gait. Skin: Warm and dry without rashes, lesions, cyanosis, clubbing or  ecchymosis.  Neuro: Cranial nerves intact, reflexes equal bilaterally. Normal muscle tone, no cerebellar symptoms. Sensation intact.  Pysch: Alert and oriented X 3, normal affect, Insight and Judgment appropriate.   Assessment and Plan  1. Annual Preventative Screening Examination  2. Essential hypertension  - EKG 12-Lead - Urinalysis, Routine w reflex microscopic - Microalbumin / creatinine urine ratio - CBC with Differential/Platelet - COMPLETE METABOLIC PANEL WITH GFR - Magnesium - TSH  3. Hyperlipidemia, mixed  - EKG 12-Lead - Lipid panel - TSH  4. Abnormal glucose  - Hemoglobin A1c - Insulin, random  5. Vitamin D  deficiency  - VITAMIN D 25 Hydroxyl  6. Chronic atrial fibrillation  - EKG 12-Lead - TSH  7. Chronic diastolic heart failure ([place])  8. Gastroesophageal reflux diseasel  - EKG 12-Lead - CBC with Differential/Platelet  9. Atherosclerosis of native coronary artery of native heart without angina pectoris  - EKG 12-Lead - Lipid panel  10. FHx: heart disease  - EKG 12-Lead  11. Screening for colorectal cancer  - POC Hemoccult Bld/Stll  12. Screening for ischemic heart disease  - EKG 12-Lead  13. Medication management  - Urinalysis, Routine w reflex microscopic - Microalbumin / creatinine urine ratio - CBC with Differential/Platelet - COMPLETE METABOLIC PANEL WITH GFR - Magnesium - Lipid panel - TSH - Hemoglobin A1c - Insulin, random - VITAMIN D 25 Hydroxyl        Patient was counseled in prudent diet to achieve/maintain BMI less than 25 for weight control, BP monitoring, regular exercise and medications. Discussed med's effects and SE's. Screening labs and tests as requested with regular follow-up as recommended. Over 40 minutes of exam, counseling, chart review and  high complex critical decision making was performed.

## 2018-02-23 LAB — CBC WITH DIFFERENTIAL/PLATELET
BASOS PCT: 1 %
Basophils Absolute: 79 cells/uL (ref 0–200)
EOS ABS: 119 {cells}/uL (ref 15–500)
EOS PCT: 1.5 %
HCT: 43.2 % (ref 35.0–45.0)
HEMOGLOBIN: 14.6 g/dL (ref 11.7–15.5)
Lymphs Abs: 2212 cells/uL (ref 850–3900)
MCH: 30.8 pg (ref 27.0–33.0)
MCHC: 33.8 g/dL (ref 32.0–36.0)
MCV: 91.1 fL (ref 80.0–100.0)
MONOS PCT: 9.6 %
MPV: 11.1 fL (ref 7.5–12.5)
NEUTROS ABS: 4732 {cells}/uL (ref 1500–7800)
Neutrophils Relative %: 59.9 %
Platelets: 240 10*3/uL (ref 140–400)
RBC: 4.74 10*6/uL (ref 3.80–5.10)
RDW: 11.9 % (ref 11.0–15.0)
TOTAL LYMPHOCYTE: 28 %
WBC mixed population: 758 cells/uL (ref 200–950)
WBC: 7.9 10*3/uL (ref 3.8–10.8)

## 2018-02-23 LAB — COMPLETE METABOLIC PANEL WITH GFR
AG RATIO: 1.8 (calc) (ref 1.0–2.5)
ALBUMIN MSPROF: 4.4 g/dL (ref 3.6–5.1)
ALKALINE PHOSPHATASE (APISO): 115 U/L (ref 33–130)
ALT: 20 U/L (ref 6–29)
AST: 24 U/L (ref 10–35)
BUN: 19 mg/dL (ref 7–25)
CALCIUM: 10 mg/dL (ref 8.6–10.4)
CO2: 30 mmol/L (ref 20–32)
Chloride: 104 mmol/L (ref 98–110)
Creat: 0.78 mg/dL (ref 0.60–0.88)
GFR, EST NON AFRICAN AMERICAN: 68 mL/min/{1.73_m2} (ref >=60)
GFR, Est African American: 79 mL/min/{1.73_m2} (ref >=60)
GLOBULIN: 2.4 g/dL (ref 1.9–3.7)
Glucose, Bld: 86 mg/dL (ref 65–99)
POTASSIUM: 4.8 mmol/L (ref 3.5–5.3)
SODIUM: 144 mmol/L (ref 135–146)
Total Bilirubin: 0.5 mg/dL (ref 0.2–1.2)
Total Protein: 6.8 g/dL (ref 6.1–8.1)

## 2018-02-23 LAB — HEMOGLOBIN A1C
EAG (MMOL/L): 6.5 (calc)
HEMOGLOBIN A1C: 5.7 %{Hb} — AB (ref <5.7)
MEAN PLASMA GLUCOSE: 117 (calc)

## 2018-02-23 LAB — LIPID PANEL
CHOL/HDL RATIO: 4.1 (calc) (ref <5.0)
CHOLESTEROL: 167 mg/dL (ref <200)
HDL: 41 mg/dL — ABNORMAL LOW (ref >50)
LDL Cholesterol (Calc): 105 mg/dL (calc) — ABNORMAL HIGH
NON-HDL CHOLESTEROL (CALC): 126 mg/dL (ref <130)
Triglycerides: 118 mg/dL (ref <150)

## 2018-02-23 LAB — MICROALBUMIN / CREATININE URINE RATIO
CREATININE, URINE: 114 mg/dL (ref 20–275)
MICROALB UR: 4.3 mg/dL
Microalb Creat Ratio: 38 mcg/mg creat — ABNORMAL HIGH (ref <30)

## 2018-02-23 LAB — URINALYSIS, ROUTINE W REFLEX MICROSCOPIC
Bilirubin Urine: NEGATIVE
Glucose, UA: NEGATIVE
HGB URINE DIPSTICK: NEGATIVE
HYALINE CAST: NONE SEEN /LPF
KETONES UR: NEGATIVE
Nitrite: NEGATIVE
Specific Gravity, Urine: 1.023 (ref 1.001–1.03)
pH: 7.5 (ref 5.0–8.0)

## 2018-02-23 LAB — TSH: TSH: 1.1 m[IU]/L (ref 0.40–4.50)

## 2018-02-23 LAB — MAGNESIUM: Magnesium: 2.2 mg/dL (ref 1.5–2.5)

## 2018-02-23 LAB — INSULIN, RANDOM: Insulin: 6.4 u[IU]/mL (ref 2.0–19.6)

## 2018-02-23 LAB — VITAMIN D 25 HYDROXY (VIT D DEFICIENCY, FRACTURES): Vit D, 25-Hydroxy: 58 ng/mL (ref 30–100)

## 2018-02-25 ENCOUNTER — Encounter: Payer: Self-pay | Admitting: Internal Medicine

## 2018-03-02 ENCOUNTER — Other Ambulatory Visit: Payer: Self-pay

## 2018-03-02 DIAGNOSIS — Z1211 Encounter for screening for malignant neoplasm of colon: Secondary | ICD-10-CM

## 2018-03-02 DIAGNOSIS — Z1212 Encounter for screening for malignant neoplasm of rectum: Principal | ICD-10-CM

## 2018-03-02 LAB — POC HEMOCCULT BLD/STL (HOME/3-CARD/SCREEN)
FECAL OCCULT BLD: NEGATIVE
FECAL OCCULT BLD: NEGATIVE
Fecal Occult Blood, POC: NEGATIVE

## 2018-03-06 DIAGNOSIS — Z1211 Encounter for screening for malignant neoplasm of colon: Secondary | ICD-10-CM | POA: Diagnosis not present

## 2018-03-16 ENCOUNTER — Ambulatory Visit (INDEPENDENT_AMBULATORY_CARE_PROVIDER_SITE_OTHER): Payer: Medicare Other | Admitting: Pharmacist

## 2018-03-16 DIAGNOSIS — I482 Chronic atrial fibrillation, unspecified: Secondary | ICD-10-CM | POA: Diagnosis not present

## 2018-03-16 DIAGNOSIS — Z7901 Long term (current) use of anticoagulants: Secondary | ICD-10-CM | POA: Diagnosis not present

## 2018-03-16 LAB — POCT INR: INR: 1.6 — AB (ref 2.0–3.0)

## 2018-03-27 ENCOUNTER — Other Ambulatory Visit: Payer: Self-pay | Admitting: Internal Medicine

## 2018-03-28 ENCOUNTER — Other Ambulatory Visit: Payer: Self-pay | Admitting: Internal Medicine

## 2018-03-28 DIAGNOSIS — G4700 Insomnia, unspecified: Secondary | ICD-10-CM

## 2018-03-28 DIAGNOSIS — F419 Anxiety disorder, unspecified: Secondary | ICD-10-CM

## 2018-03-28 MED ORDER — ALPRAZOLAM 1 MG PO TABS: ORAL_TABLET | ORAL | 0 refills | Status: DC

## 2018-03-28 MED ORDER — TRAZODONE HCL 150 MG PO TABS: ORAL_TABLET | ORAL | 2 refills | Status: DC

## 2018-04-17 ENCOUNTER — Emergency Department (HOSPITAL_COMMUNITY): Payer: Medicare Other

## 2018-04-17 ENCOUNTER — Observation Stay (HOSPITAL_COMMUNITY)
Admission: EM | Admit: 2018-04-17 | Discharge: 2018-04-18 | Disposition: A | Discharge status: Home / Self Care | Payer: Medicare Other | Attending: Internal Medicine | Admitting: Internal Medicine

## 2018-04-17 ENCOUNTER — Telehealth: Payer: Self-pay | Admitting: *Deleted

## 2018-04-17 ENCOUNTER — Observation Stay (HOSPITAL_COMMUNITY): Payer: Medicare Other

## 2018-04-17 ENCOUNTER — Other Ambulatory Visit: Payer: Self-pay | Admitting: Cardiovascular Disease

## 2018-04-17 ENCOUNTER — Encounter (HOSPITAL_COMMUNITY): Payer: Self-pay | Admitting: Emergency Medicine

## 2018-04-17 DIAGNOSIS — I10 Essential (primary) hypertension: Secondary | ICD-10-CM | POA: Insufficient documentation

## 2018-04-17 DIAGNOSIS — Z66 Do not resuscitate: Secondary | ICD-10-CM | POA: Insufficient documentation

## 2018-04-17 DIAGNOSIS — R2689 Other abnormalities of gait and mobility: Secondary | ICD-10-CM | POA: Insufficient documentation

## 2018-04-17 DIAGNOSIS — Z91040 Latex allergy status: Secondary | ICD-10-CM | POA: Insufficient documentation

## 2018-04-17 DIAGNOSIS — G629 Polyneuropathy, unspecified: Secondary | ICD-10-CM | POA: Diagnosis not present

## 2018-04-17 DIAGNOSIS — I5032 Chronic diastolic (congestive) heart failure: Secondary | ICD-10-CM | POA: Diagnosis not present

## 2018-04-17 DIAGNOSIS — I4892 Unspecified atrial flutter: Secondary | ICD-10-CM | POA: Insufficient documentation

## 2018-04-17 DIAGNOSIS — G459 Transient cerebral ischemic attack, unspecified: Principal | ICD-10-CM | POA: Insufficient documentation

## 2018-04-17 DIAGNOSIS — I700 Atherosclerosis of aorta: Secondary | ICD-10-CM | POA: Diagnosis not present

## 2018-04-17 DIAGNOSIS — I482 Chronic atrial fibrillation, unspecified: Secondary | ICD-10-CM | POA: Diagnosis not present

## 2018-04-17 DIAGNOSIS — M6281 Muscle weakness (generalized): Secondary | ICD-10-CM | POA: Insufficient documentation

## 2018-04-17 DIAGNOSIS — E782 Mixed hyperlipidemia: Secondary | ICD-10-CM | POA: Diagnosis present

## 2018-04-17 DIAGNOSIS — Z880 Allergy status to penicillin: Secondary | ICD-10-CM | POA: Insufficient documentation

## 2018-04-17 DIAGNOSIS — D329 Benign neoplasm of meninges, unspecified: Secondary | ICD-10-CM | POA: Insufficient documentation

## 2018-04-17 DIAGNOSIS — Z79899 Other long term (current) drug therapy: Secondary | ICD-10-CM | POA: Insufficient documentation

## 2018-04-17 DIAGNOSIS — I2510 Atherosclerotic heart disease of native coronary artery without angina pectoris: Secondary | ICD-10-CM | POA: Diagnosis not present

## 2018-04-17 DIAGNOSIS — Z950 Presence of cardiac pacemaker: Secondary | ICD-10-CM | POA: Diagnosis present

## 2018-04-17 DIAGNOSIS — I082 Rheumatic disorders of both aortic and tricuspid valves: Secondary | ICD-10-CM | POA: Diagnosis not present

## 2018-04-17 DIAGNOSIS — Z9049 Acquired absence of other specified parts of digestive tract: Secondary | ICD-10-CM | POA: Insufficient documentation

## 2018-04-17 DIAGNOSIS — R2681 Unsteadiness on feet: Secondary | ICD-10-CM | POA: Insufficient documentation

## 2018-04-17 DIAGNOSIS — M9981 Other biomechanical lesions of cervical region: Secondary | ICD-10-CM

## 2018-04-17 DIAGNOSIS — Z8249 Family history of ischemic heart disease and other diseases of the circulatory system: Secondary | ICD-10-CM | POA: Insufficient documentation

## 2018-04-17 DIAGNOSIS — M4802 Spinal stenosis, cervical region: Secondary | ICD-10-CM | POA: Diagnosis not present

## 2018-04-17 DIAGNOSIS — Z881 Allergy status to other antibiotic agents status: Secondary | ICD-10-CM | POA: Insufficient documentation

## 2018-04-17 DIAGNOSIS — Z7901 Long term (current) use of anticoagulants: Secondary | ICD-10-CM | POA: Diagnosis not present

## 2018-04-17 DIAGNOSIS — G4700 Insomnia, unspecified: Secondary | ICD-10-CM | POA: Diagnosis not present

## 2018-04-17 DIAGNOSIS — E785 Hyperlipidemia, unspecified: Secondary | ICD-10-CM | POA: Diagnosis not present

## 2018-04-17 DIAGNOSIS — F419 Anxiety disorder, unspecified: Secondary | ICD-10-CM | POA: Diagnosis not present

## 2018-04-17 DIAGNOSIS — G319 Degenerative disease of nervous system, unspecified: Secondary | ICD-10-CM | POA: Diagnosis not present

## 2018-04-17 DIAGNOSIS — R4701 Aphasia: Secondary | ICD-10-CM | POA: Diagnosis present

## 2018-04-17 DIAGNOSIS — Z85828 Personal history of other malignant neoplasm of skin: Secondary | ICD-10-CM | POA: Insufficient documentation

## 2018-04-17 DIAGNOSIS — I110 Hypertensive heart disease with heart failure: Secondary | ICD-10-CM | POA: Insufficient documentation

## 2018-04-17 DIAGNOSIS — Z888 Allergy status to other drugs, medicaments and biological substances status: Secondary | ICD-10-CM | POA: Insufficient documentation

## 2018-04-17 DIAGNOSIS — Z8673 Personal history of transient ischemic attack (TIA), and cerebral infarction without residual deficits: Secondary | ICD-10-CM | POA: Diagnosis present

## 2018-04-17 LAB — COMPREHENSIVE METABOLIC PANEL
ALBUMIN: 3.8 g/dL (ref 3.5–5.0)
ALT: 18 U/L (ref 0–44)
AST: 22 U/L (ref 15–41)
Alkaline Phosphatase: 90 U/L (ref 38–126)
Anion gap: 10 (ref 5–15)
BUN: 16 mg/dL (ref 8–23)
CO2: 28 mmol/L (ref 22–32)
Calcium: 9.1 mg/dL (ref 8.9–10.3)
Chloride: 104 mmol/L (ref 98–111)
Creatinine, Ser: 1.03 mg/dL — ABNORMAL HIGH (ref 0.44–1.00)
GFR calc Af Amer: 56 mL/min — ABNORMAL LOW (ref >60)
GFR calc non Af Amer: 48 mL/min — ABNORMAL LOW (ref >60)
GLUCOSE: 92 mg/dL (ref 70–99)
Potassium: 4.1 mmol/L (ref 3.5–5.1)
Sodium: 142 mmol/L (ref 135–145)
Total Bilirubin: 0.9 mg/dL (ref 0.3–1.2)
Total Protein: 7.4 g/dL (ref 6.5–8.1)

## 2018-04-17 LAB — DIFFERENTIAL
ABS IMMATURE GRANULOCYTES: 0.02 10*3/uL (ref 0.00–0.07)
Basophils Absolute: 0.1 10*3/uL (ref 0.0–0.1)
Basophils Relative: 1 %
Eosinophils Absolute: 0.1 10*3/uL (ref 0.0–0.5)
Eosinophils Relative: 1 %
IMMATURE GRANULOCYTES: 0 %
LYMPHS ABS: 2.6 10*3/uL (ref 0.7–4.0)
Lymphocytes Relative: 29 %
Monocytes Absolute: 0.9 10*3/uL (ref 0.1–1.0)
Monocytes Relative: 10 %
Neutro Abs: 5.3 10*3/uL (ref 1.7–7.7)
Neutrophils Relative %: 59 %

## 2018-04-17 LAB — CBC
HCT: 43.6 % (ref 36.0–46.0)
Hemoglobin: 13.8 g/dL (ref 12.0–15.0)
MCH: 29.9 pg (ref 26.0–34.0)
MCHC: 31.7 g/dL (ref 30.0–36.0)
MCV: 94.6 fL (ref 80.0–100.0)
Platelets: 206 10*3/uL (ref 150–400)
RBC: 4.61 MIL/uL (ref 3.87–5.11)
RDW: 12.7 % (ref 11.5–15.5)
WBC: 9 10*3/uL (ref 4.0–10.5)
nRBC: 0 % (ref 0.0–0.2)

## 2018-04-17 LAB — I-STAT CHEM 8, ED
BUN: 18 mg/dL (ref 8–23)
Calcium, Ion: 1.1 mmol/L — ABNORMAL LOW (ref 1.15–1.40)
Chloride: 104 mmol/L (ref 98–111)
Creatinine, Ser: 0.9 mg/dL (ref 0.44–1.00)
Glucose, Bld: 89 mg/dL (ref 70–99)
HEMATOCRIT: 43 % (ref 36.0–46.0)
Hemoglobin: 14.6 g/dL (ref 12.0–15.0)
Potassium: 4 mmol/L (ref 3.5–5.1)
SODIUM: 140 mmol/L (ref 135–145)
TCO2: 29 mmol/L (ref 22–32)

## 2018-04-17 LAB — I-STAT TROPONIN, ED: Troponin i, poc: 0 ng/mL (ref 0.00–0.08)

## 2018-04-17 LAB — PROTIME-INR
INR: 2.27
Prothrombin Time: 24.7 seconds — ABNORMAL HIGH (ref 11.4–15.2)

## 2018-04-17 LAB — APTT: aPTT: 31 seconds (ref 24–36)

## 2018-04-17 LAB — ETHANOL: Alcohol, Ethyl (B): <10 mg/dL (ref <10)

## 2018-04-17 LAB — CBG MONITORING, ED: Glucose-Capillary: 93 mg/dL (ref 70–99)

## 2018-04-17 MED ORDER — ALPRAZOLAM 0.25 MG PO TABS: 0.2500 mg | ORAL_TABLET | Freq: Every evening | ORAL | Status: DC | PRN

## 2018-04-17 MED ORDER — ACETAMINOPHEN 325 MG PO TABS
650.0000 mg | ORAL_TABLET | ORAL | Status: DC | PRN
  Filled 2018-04-17: qty 2

## 2018-04-17 MED ORDER — ATORVASTATIN CALCIUM 40 MG PO TABS
40.0000 mg | ORAL_TABLET | Freq: Every day | ORAL | Status: DC
  Administered 2018-04-17: 40 mg via ORAL
  Filled 2018-04-17: qty 1

## 2018-04-17 MED ORDER — ASPIRIN 300 MG RE SUPP: 300.0000 mg | Freq: Every day | RECTAL | Status: DC

## 2018-04-17 MED ORDER — VITAMIN D 25 MCG (1000 UNIT) PO TABS
3000.0000 [IU] | ORAL_TABLET | Freq: Every day | ORAL | Status: DC
  Administered 2018-04-17 – 2018-04-18 (×2): 3000 [IU] via ORAL
  Filled 2018-04-17 (×2): qty 3

## 2018-04-17 MED ORDER — SODIUM CHLORIDE 0.9 % IV SOLN
INTRAVENOUS | Status: DC
  Administered 2018-04-17: 17:00:00 via INTRAVENOUS

## 2018-04-17 MED ORDER — WARFARIN - PHARMACIST DOSING INPATIENT: Freq: Every day | Status: DC

## 2018-04-17 MED ORDER — TRAZODONE HCL 50 MG PO TABS: 25.0000 mg | ORAL_TABLET | Freq: Every evening | ORAL | Status: DC | PRN

## 2018-04-17 MED ORDER — WARFARIN SODIUM 3 MG PO TABS
3.0000 mg | ORAL_TABLET | Freq: Once | ORAL | Status: AC
  Administered 2018-04-17: 3 mg via ORAL
  Filled 2018-04-17: qty 1

## 2018-04-17 MED ORDER — ASPIRIN 325 MG PO TABS
325.0000 mg | ORAL_TABLET | Freq: Every day | ORAL | Status: DC
  Administered 2018-04-17 – 2018-04-18 (×2): 325 mg via ORAL
  Filled 2018-04-17 (×2): qty 1

## 2018-04-17 MED ORDER — ACETAMINOPHEN 650 MG RE SUPP: 650.0000 mg | RECTAL | Status: DC | PRN

## 2018-04-17 MED ORDER — IOPAMIDOL (ISOVUE-370) INJECTION 76%
100.0000 mL | Freq: Once | INTRAVENOUS | Status: AC | PRN
  Administered 2018-04-17: 100 mL via INTRAVENOUS

## 2018-04-17 MED ORDER — IOPAMIDOL (ISOVUE-370) INJECTION 76%
INTRAVENOUS | Status: AC
  Filled 2018-04-17: qty 50

## 2018-04-17 MED ORDER — STROKE: EARLY STAGES OF RECOVERY BOOK
Freq: Once | Status: AC
  Administered 2018-04-18: 04:00:00
  Filled 2018-04-17 (×2): qty 1

## 2018-04-17 MED ORDER — SENNOSIDES-DOCUSATE SODIUM 8.6-50 MG PO TABS: 1.0000 | ORAL_TABLET | Freq: Every evening | ORAL | Status: DC | PRN

## 2018-04-17 MED ORDER — ACETAMINOPHEN 160 MG/5ML PO SOLN: 650.0000 mg | ORAL | Status: DC | PRN

## 2018-04-17 NOTE — Telephone Encounter (Signed)
Patient called and reported that she had an episode of being unable to speak at church on 04/16/2018.  She reports a pain in her neck,under her jaw, today and is very weak.  Per Dr Melford Aase, the patient was advised to go to [facility] ED. The patient states she will get someone to drive her to the ED.

## 2018-04-17 NOTE — H&P (Signed)
History and Physical    Misty Blackwell DGU:[id number] DOB: 09-05-1927 DOA: 04/17/2018  PCP: [name], MD Consultants:  [name] - cardiology Patient coming from: Home - lives alone; NOK: Niece, [phone]  Chief Complaint: weakness  HPI: Misty Blackwell is a 84 y.o. female with medical history significant of TIA; pacemaker placement; CAD; HTN; CHF; and afib on Coumadin presenting with weakness.  She was having some problems with swallowing and speech - expressive aphasia.  She noticed problems at church while practicing for the [place].  Occasional word slurring.  She has been off balance for a little while off and on, but today she looks like she is "swimmy" when trying to stand up.  She feels dysphagia but it is her mandibular region, not lower.  She had difficulty swallowing everything and she still notices it subtly.  Her niece noticed some slurring in the middle of the word and some words that she couldn't come up with.  Her left mouth was tingling for a while.  No arm/legs symptoms.  She had been caring for her sister and she had to live with her daughter and she is having hallucinations - it has been very sad and stressful.  She thinks her symptoms started then, last Thursday, and has been progressively worsening since.   ED Course:  Old stroke, not in the window.  Difficulty articulating, expressive aphasia, mild dysphagia, balance disturbance.  Mild aphasia but fairly clear and without other deficits.  Head CT negative. ?MRI - has pacemaker.  Neuro consult pending.  Review of Systems: As per HPI; otherwise review of systems reviewed and negative.   Ambulatory Status:  Ambulates without assistance  Past Medical History:  Diagnosis Date  . Atrial fib/flutter, transient   . Atrial fibrillation, chronic   . CHF (congestive heart failure) ([place]) 10/29/2009   Echo - EF >55%; normal LV size and systolic function; unable to assess diastolic fcn due to E/A fusion, pulmonary vein flow  pattern suggests elevated filling pressure; marked biatrail dilation, mild/mod tricuspid regurgitation; mod pulmonary htn; mild/mod mitral regurgitation; although echocardiographic features are incomplete findings suggest possible infiltrative cardiomyopathy (maybe amyloidosi  . Coronary artery disease 03/19/2002   R/P Cardiolite - EF 76%; nromal static and dynamic myocardial perfusion images; normal wall motion and endocardial thickening in all vascular territories  . Facial numbness 12/26/2008   carotid doppler - R and L ICAs 0-49% diameter reduction (velocities suggest low end of scale)  . Hypertension   . Pacemaker   . Peripheral neuropathy   . Skin cancer    s/p surgical removal.  . TIA (transient ischemic attack)     Past Surgical History:  Procedure Laterality Date  . ABDOMINAL HYSTERECTOMY    . APPENDECTOMY    . CARDIAC CATHETERIZATION  08/06/2005   minimal coronary disease predominant RCA; no significant atherosclerosis; new onset sick sinus syndrome and atrial flutter w/ ventricular response, controlled on med therapy; systemic HTN, normal renal arteries  . CARDIOVERSION  11/19/2009   successful DCCV from AF to sinus type rhythm  . CHOLECYSTECTOMY    . Skin cancer resection      Social History   Socioeconomic History  . Marital status: Married    Spouse name: Not on file  . Number of children: 0  . Years of education: Not on file  . Highest education level: Not on file  Occupational History  . Not on file  Social Needs  . Financial resource strain: Not on file  . Food  insecurity:    Worry: Not on file    Inability: Not on file  . Transportation needs:    Medical: Not on file    Non-medical: Not on file  Tobacco Use  . Smoking status: Never Smoker  . Smokeless tobacco: Never Used  Substance and Sexual Activity  . Alcohol use: No  . Drug use: No  . Sexual activity: Never  Lifestyle  . Physical activity:    Days per week: Not on file    Minutes per session: Not  on file  . Stress: Not on file  Relationships  . Social connections:    Talks on phone: Not on file    Gets together: Not on file    Attends religious service: Not on file    Active member of club or organization: Not on file    Attends meetings of clubs or organizations: Not on file    Relationship status: Not on file  . Intimate partner violence:    Fear of current or ex partner: Not on file    Emotionally abused: Not on file    Physically abused: Not on file    Forced sexual activity: Not on file  Other Topics Concern  . Not on file  Social History Narrative   Widowed.  Lives alone.  Ambulates independently.    Allergies  Allergen Reactions  . Latex Itching  . Ace Inhibitors Other (See Comments)    Unknown reaction  . Acrylic Polymer [Carbomer] Other (See Comments)  . Augmentin [Amoxicillin-Pot Clavulanate] Other (See Comments)  . Ciprofloxacin Other (See Comments)  . Levaquin [Levofloxacin In D5w] Other (See Comments)  . Zocor [Simvastatin] Other (See Comments)  . Chocolate Other (See Comments)    migraine's   . Gabapentin Other (See Comments)    Unsteady gait     Family History  Problem Relation Age of Onset  . Heart disease Mother   . Diabetes Mother   . Heart attack Father   . Heart disease Father   . Cirrhosis Brother     Prior to Admission medications   Medication Sig Start Date End Date Taking? Authorizing Provider  ALPRAZolam Duanne Moron) 1 MG tablet Take 1/2-1 tablet 1 to 2 x /day ONLY if needed for Anxiety Attack &  Please try to limit to 5 days  /week to avoid addiction 03/28/18   [name], MD  Ascorbic Acid (VITAMIN C PO) Take 1 tablet by mouth daily.    [provider]  BIOTIN PO Take 1 tablet by mouth daily.    [provider]  Cholecalciferol (VITAMIN D3) 3000 units TABS Take 1 tablet by mouth daily.    [provider]  clotrimazole-betamethasone (LOTRISONE) cream Apply to affected area 2 times daily 02/22/18 02/22/19   [name], MD  Cyanocobalamin (VITAMIN B 12 PO) Take 1 tablet by mouth daily.    [provider]  diltiazem (TIAZAC) 180 MG 24 hr capsule Take 1 capsule (180 mg total) by mouth daily. 11/10/17   [name], Mihai, MD  furosemide (LASIX) 40 MG tablet Take 1 tablet (40 mg total) by mouth daily. 07/20/17   [name], Mihai, MD  IRON PO Take 1 tablet by mouth daily.    [provider]  MAGNESIUM PO Take 1 tablet by mouth daily.    [provider]  Multiple Vitamins-Minerals (ZINC PO) Take 1 tablet by mouth daily.    [provider]  nitroGLYCERIN (NITROSTAT) 0.4 MG SL tablet Place 0.4 mg under the tongue  every 5 (five) minutes as needed for chest pain.    [provider]  potassium chloride SA (K-DUR,KLOR-CON) 10 MEQ tablet Take 1 tablet (10 mEq total) by mouth daily. 11/14/17   [name], [name], MD  telmisartan (MICARDIS) 80 MG tablet Take 1/2 to 1 tablet every morning for BP & Heart 07/25/17 01/25/18  [name], MD  traZODone (DESYREL) 150 MG tablet Take 1/2 to 1 tablet 1 hour before sleep 03/28/18   [name], MD  warfarin (COUMADIN) 3 MG tablet TAKE 1 TO 1 AND 1/2 TABLET BY MOUTH TABLETS AS DIRECTED 04/17/18   [name], [name], MD    Physical Exam: Vitals:   04/17/18 1230 04/17/18 1300 04/17/18 1315 04/17/18 1330  BP: 136/81 (!) 146/78 (!) 145/80 132/60  Pulse: 75 77 86 70  Resp: 18 19 15  (!) 21  Temp:      TempSrc:      SpO2: 100% 98% 100% 98%     General:  Appears calm and comfortable and is NAD; very conversant with minimal noticeable aphasia when she is actively engaged in conversation Eyes:   EOMI, normal lids, iris ENT:  grossly normal hearing, lips & tongue, mmm Neck:  no LAD, masses or thyromegaly; no carotid bruits Cardiovascular:  Irregularly irregular, rate controlled, no m/r/g. No LE edema.  Respiratory:   CTA bilaterally with no wheezes/rales/rhonchi.  Normal respiratory effort. Abdomen:  soft, NT, ND, NABS Back:    normal alignment, no CVAT Skin:  no rash or induration seen on limited exam Musculoskeletal:  grossly normal tone BUE/BLE, good ROM, no bony abnormality Psychiatric:  grossly normal mood and affect, speech fluent and appropriate, AOx3 Neurologic:  CN 2-12 grossly intact, moves all extremities in coordinated fashion, sensation intact    Radiological Exams on Admission: Ct Head Wo Contrast  Result Date: 04/17/2018 CLINICAL DATA:  84 year old female with progressively worsening weakness and difficulty speaking. Symptoms worsened yesterday. Initial encounter. EXAM: CT HEAD WITHOUT CONTRAST TECHNIQUE: Contiguous axial images were obtained from the base of the skull through the vertex without intravenous contrast. COMPARISON:  06/20/2009. FINDINGS: Brain: No intracranial hemorrhage or CT evidence of large acute infarct. Chronic microvascular changes. Global atrophy. Anterior left middle cranial fossa/clinoid 1.2 cm calcified meningioma without surrounding vasogenic edema. Vascular: Vascular calcifications.  No acute hyperdense vessel. Skull: Hyperostosis frontalis interna. Sinuses/Orbits: No acute orbital abnormality. Post lens replacement. Visualized paranasal sinuses are clear. Other: Mastoid air cells and middle ear cavities are clear. IMPRESSION: 1. No intracranial hemorrhage or CT evidence of large acute infarct. 2. Chronic microvascular changes. 3. Global atrophy. 4. Anterior left middle cranial fossa/clinoid 1.2 cm calcified meningioma without surrounding vasogenic edema. Electronically Signed   By: Genia Del M.D.   On: 04/17/2018 13:37    EKG: Independently reviewed.  Afib with rate 82; nonspecific ST changes with no evidence of acute ischemia   Labs on Admission: I have personally reviewed the available labs and imaging studies at the time of the admission.  Pertinent labs:   BUN 16/Creatinine 1.03/GFR 48 Troponin 0.00 Normal CBC INR 2.27 ETOH <10  Assessment/Plan Principal  Problem:   TIA (transient ischemic attack) Active Problems:   Hyperlipidemia   Essential hypertension   Long term current use of anticoagulant therapy   Pacemaker   Chronic diastolic heart failure (H[facility])   Anxiety   Atrial fibrillation, chronic   TIA/CVA -Patient with neurologic symptoms since last Thursday, concerning for CVA given duration of symptoms -Will place in observation status for CVA/TIA evaluation -Negative head  CT -Telemetry monitoring -MRI if pacemaker will permit (info entered, can cancel order if not MRI-compatible) -CTA head-neck -Echo -Risk stratification with FLP -ASA daily -Neurology consult -PT/OT/ST/Nutrition Consults  HTN -Allow permissive HTN for now -Treat BP only if >220/120, and then with goal of 15% reduction -Hold ARB and Diltiazem and plan to restart in 48-72 hours   HLD -FLP checked 10/23: 167/41/105/118 -Start Lipitor 40 mg daily   Afib, on Coumadin -Rate controlled on Diltiazem - may need to resume despite issue with possible permissive HTN since she is probably out of the window and may need rate control - but she has pacer so this may not be an issue -On Coumadin, good INR, pharm to dose  Pacemaker -May restrict MRI  Chronic diastolic CHF -Appears to be compensated -Last echo in 4/19 was unremarkable, will repeat  Anxiety/insomnia  -Has been trying to wean off Xanax -Was given trazodone but she read the side effect profile and got anxious -Will change dose from 75 mg to 25 mg qhs prn and see if she can tolerate   DVT prophylaxis:  Coumadin  Code Status: DNR - confirmed with patient/family Family Communication: Niece present throughout evaluation Disposition Plan:  Home once clinically improved Consults called: Neurology; PT/OT/ST/Nutrition  Admission status: It is my clinical opinion that referral for OBSERVATION is reasonable and necessary in this patient based on the above information provided. The aforementioned taken together  are felt to place the patient at high risk for further clinical deterioration. However it is anticipated that the patient may be medically stable for discharge from the hospital within 24 to 48 hours.    Karmen Bongo MD [facility]  If note is complete, please contact covering daytime or nighttime physician. [email or web address] Password [email or web address]  04/17/2018, 4:02 PM

## 2018-04-17 NOTE — Plan of Care (Signed)
  Problem: Ischemic Stroke/TIA Tissue Perfusion: Goal: Complications of ischemic stroke/TIA will be minimized Outcome: Progressing   

## 2018-04-17 NOTE — Plan of Care (Signed)
Patient received her Stroke education Booklet.

## 2018-04-17 NOTE — ED Triage Notes (Signed)
Pt to ER by private vehicle for evaluation of progressively worsening weakness, feeling like she can't speak, and "feeling like something is stuck in my throat." pt speaking in complete sentences fluently, reports she is "wobbly." reports all of this worsened yesterday.

## 2018-04-17 NOTE — Progress Notes (Signed)
ANTICOAGULATION CONSULT NOTE - Initial Consult  Pharmacy Consult for Warfarin Indication: atrial fibrillation  Allergies  Allergen Reactions  . Latex Itching  . Ace Inhibitors Other (See Comments)    Unknown reaction  . Acrylic Polymer [Carbomer] Other (See Comments)  . Augmentin [Amoxicillin-Pot Clavulanate] Other (See Comments)  . Ciprofloxacin Other (See Comments)  . Levaquin [Levofloxacin In D5w] Other (See Comments)  . Zocor [Simvastatin] Other (See Comments)  . Chocolate Other (See Comments)    migraine's   . Gabapentin Other (See Comments)    Unsteady gait     Patient Measurements:   Heparin Dosing Weight:   Vital Signs: Temp: 98.9 F (37.2 C) (12/16 1214) Temp Source: Oral (12/16 1214) BP: 132/60 (12/16 1330) Pulse Rate: 70 (12/16 1330)  Labs: Recent Labs    04/17/18 1227 04/17/18 1235  HGB 13.8 14.6  HCT 43.6 43.0  PLT 206  --   APTT 31  --   LABPROT 24.7*  --   INR 2.27  --   CREATININE 1.03* 0.90    CrCl cannot be calculated (Unknown ideal weight.).   Medical History: Past Medical History:  Diagnosis Date  . Atrial fib/flutter, transient   . Atrial fibrillation, chronic   . CHF (congestive heart failure) ([place]) 10/29/2009   Echo - EF >55%; normal LV size and systolic function; unable to assess diastolic fcn due to E/A fusion, pulmonary vein flow pattern suggests elevated filling pressure; marked biatrail dilation, mild/mod tricuspid regurgitation; mod pulmonary htn; mild/mod mitral regurgitation; although echocardiographic features are incomplete findings suggest possible infiltrative cardiomyopathy (maybe amyloidosi  . Coronary artery disease 03/19/2002   R/P Cardiolite - EF 76%; nromal static and dynamic myocardial perfusion images; normal wall motion and endocardial thickening in all vascular territories  . Facial numbness 12/26/2008   carotid doppler - R and L ICAs 0-49% diameter reduction (velocities suggest low end of scale)  . Hypertension    . Pacemaker   . Peripheral neuropathy   . Skin cancer    s/p surgical removal.  . TIA (transient ischemic attack)     Assessment: Misty Blackwell who presented on 12/16 with weakness and concern for TIA/CVA. The patient was on warfarin PTA for hx Afib. Admit INR 2.27 on PTA dose.   Goal of Therapy:  INR 2-3 Monitor platelets by anticoagulation protocol: Yes   Plan:  - Warfarin 3 mg x 1 dose at 1800 today - Will continue to monitor for any signs/symptoms of bleeding and will follow up with PT/INR in the a.m.   Thank you for allowing pharmacy to be a part of this patient's care.  Alycia Rossetti, PharmD, BCPS Clinical Pharmacist Please check AMION for all [facility] numbers 04/17/2018 5:05 PM

## 2018-04-17 NOTE — ED Provider Notes (Signed)
[facility] EMERGENCY DEPARTMENT Provider Note   CSN: [id number] Arrival date & time: 04/17/18  1203     History   Chief Complaint Chief Complaint  Patient presents with  . Weakness    started yesterday morning    HPI Misty Blackwell is a 84 y.o. female.  Patient is a 84 year old female with a history of atrial fibrillation on Coumadin, CHF, coronary artery disease, hypertension and prior TIA who presents with difficulty speaking.  She states that when she was singing in the choir yesterday she noted that she started having trouble getting her words out.  She feels like her speech is not fluent like it normally is.  She is having a hard time thinking of her words.  She denies any slurred speech.  No vision changes.  No numbness or weakness to her extremities.  She does feel a little bit off balance starting yesterday as well and she feels like she is having little trouble swallowing.  She is not choking on anything.  She just feels like there is something getting stuck in the back of her throat although she is able to swallow her food and liquids down.     Past Medical History:  Diagnosis Date  . Atrial fib/flutter, transient   . Atrial fibrillation, chronic   . CHF (congestive heart failure) ([place]) 10/29/2009   Echo - EF >55%; normal LV size and systolic function; unable to assess diastolic fcn due to E/A fusion, pulmonary vein flow pattern suggests elevated filling pressure; marked biatrail dilation, mild/mod tricuspid regurgitation; mod pulmonary htn; mild/mod mitral regurgitation; although echocardiographic features are incomplete findings suggest possible infiltrative cardiomyopathy (maybe amyloidosi  . Coronary artery disease 03/19/2002   R/P Cardiolite - EF 76%; nromal static and dynamic myocardial perfusion images; normal wall motion and endocardial thickening in all vascular territories  . Facial numbness 12/26/2008   carotid doppler - R and L ICAs 0-49%  diameter reduction (velocities suggest low end of scale)  . Hypertension   . Pacemaker   . Peripheral neuropathy   . Skin cancer    s/p surgical removal.  . TIA (transient ischemic attack)     Patient Active Problem List   Diagnosis Date Noted  . TIA (transient ischemic attack) 04/17/2018  . Atrial fibrillation, chronic   . Anxiety 11/22/2017  . Overweight (BMI 25.0-29.9) 11/21/2017  . Chronic diastolic heart failure ([place]) 11/10/2017  . Prediabetes 08/15/2013  . Vitamin D deficiency 08/15/2013  . Medication management 08/15/2013  . Pacemaker 09/19/2012  . Long term current use of anticoagulant therapy 07/18/2012  . Hyperlipidemia 09/03/2008  . Essential hypertension 09/03/2008  . Coronary atherosclerosis 09/03/2008  . GERD 09/03/2008  . FIBROCYSTIC BREAST DISEASE 09/03/2008  . Osteoarthritis 09/03/2008    Past Surgical History:  Procedure Laterality Date  . ABDOMINAL HYSTERECTOMY    . APPENDECTOMY    . CARDIAC CATHETERIZATION  08/06/2005   minimal coronary disease predominant RCA; no significant atherosclerosis; new onset sick sinus syndrome and atrial flutter w/ ventricular response, controlled on med therapy; systemic HTN, normal renal arteries  . CARDIOVERSION  11/19/2009   successful DCCV from AF to sinus type rhythm  . CHOLECYSTECTOMY    . Skin cancer resection       OB History   No obstetric history on file.      Home Medications    Prior to Admission medications   Medication Sig Start Date End Date Taking? Authorizing Provider  ALPRAZolam Duanne Moron) 1 MG tablet Take  1/2-1 tablet 1 to 2 x /day ONLY if needed for Anxiety Attack &  Please try to limit to 5 days  /week to avoid addiction Patient taking differently: 0.5 mg at bedtime as needed for anxiety.  03/28/18  Yes [name], MD  BIOTIN PO Take 1 tablet by mouth daily.   Yes [provider]  Cholecalciferol (VITAMIN D3) 3000 units TABS Take 3,000 tablets by mouth daily.    Yes [provider]  Cyanocobalamin (VITAMIN B 12 PO) Take 1 tablet by mouth daily.   Yes [provider]  diltiazem (TIAZAC) 180 MG 24 hr capsule Take 1 capsule (180 mg total) by mouth daily. 11/10/17  Yes [name], Mihai, MD  furosemide (LASIX) 40 MG tablet Take 1 tablet (40 mg total) by mouth daily. 07/20/17  Yes [name], Mihai, MD  IRON PO Take 65 mg by mouth daily.    Yes [provider]  MAGNESIUM PO Take 1 tablet by mouth daily.   Yes [provider]  Multiple Vitamins-Minerals (ZINC PO) Take 1 tablet by mouth daily.   Yes [provider]  nitroGLYCERIN (NITROSTAT) 0.4 MG SL tablet Place 0.4 mg under the tongue every 5 (five) minutes as needed for chest pain.   Yes [provider]  potassium chloride SA (K-DUR,KLOR-CON) 10 MEQ tablet Take 1 tablet (10 mEq total) by mouth daily. 11/14/17  Yes [name], Mihai, MD  telmisartan (MICARDIS) 80 MG tablet Take 1/2 to 1 tablet every morning for BP & Heart Patient taking differently: Take 40 mg by mouth daily.  07/25/17 04/17/18 Yes [name], MD  warfarin (COUMADIN) 3 MG tablet TAKE 1 TO 1 AND 1/2 TABLET BY MOUTH TABLETS AS DIRECTED Patient taking differently: Take 3 mg by mouth daily.  04/17/18  Yes [name], Mihai, MD    Family History Family History  Problem Relation Age of Onset  . Heart disease Mother   . Diabetes Mother   . Heart attack Father   . Heart disease Father   . Cirrhosis Brother     Social History Social History   Tobacco Use  . Smoking status: Never Smoker  . Smokeless tobacco: Never Used  Substance Use Topics  . Alcohol use: No  . Drug use: No     Allergies   Latex; Ace inhibitors; Acrylic polymer [carbomer]; Augmentin [amoxicillin-pot clavulanate]; Ciprofloxacin; Levaquin [levofloxacin in d5w]; Zocor [simvastatin]; Chocolate; and Gabapentin   Review of Systems Review of Systems  Constitutional: Negative for chills, diaphoresis, fatigue and fever.  HENT: Positive  for trouble swallowing. Negative for congestion, rhinorrhea and sneezing.   Eyes: Negative.   Respiratory: Negative for cough, chest tightness and shortness of breath.   Cardiovascular: Negative for chest pain and leg swelling.  Gastrointestinal: Negative for abdominal pain, blood in stool, diarrhea, nausea and vomiting.  Genitourinary: Negative for difficulty urinating, flank pain, frequency and hematuria.  Musculoskeletal: Negative for arthralgias and back pain.  Skin: Negative for rash.  Neurological: Positive for speech difficulty. Negative for dizziness, weakness, numbness and headaches.     Physical Exam Updated Vital Signs BP 132/60   Pulse 70   Temp 98.9 F (37.2 C) (Oral)   Resp (!) 21   SpO2 98%   Physical Exam Constitutional:      Appearance: She is well-developed.  HENT:     Head: Normocephalic and atraumatic.  Eyes:     Pupils: Pupils are equal, round, and reactive to light.  Neck:     Musculoskeletal: Normal range of  motion and neck supple.  Cardiovascular:     Rate and Rhythm: Normal rate and regular rhythm.     Heart sounds: Normal heart sounds.  Pulmonary:     Effort: Pulmonary effort is normal. No respiratory distress.     Breath sounds: Normal breath sounds. No wheezing or rales.  Chest:     Chest wall: No tenderness.  Abdominal:     General: Bowel sounds are normal.     Palpations: Abdomen is soft.     Tenderness: There is no abdominal tenderness. There is no guarding or rebound.  Musculoskeletal: Normal range of motion.  Lymphadenopathy:     Cervical: No cervical adenopathy.  Skin:    General: Skin is warm and dry.     Findings: No rash.  Neurological:     Mental Status: She is alert and oriented to person, place, and time.     Comments: Cranial nerves II through XII grossly intact, visual fields full to confrontation motor 5 out of 5 all extremities, sensation grossly intact light touch in all extremities, no pronator drift, finger-nose intact        ED Treatments / Results  Labs (all labs ordered are listed, but only abnormal results are displayed) Labs Reviewed  PROTIME-INR - Abnormal; Notable for the following components:      Result Value   Prothrombin Time 24.7 (*)    All other components within normal limits  COMPREHENSIVE METABOLIC PANEL - Abnormal; Notable for the following components:   Creatinine, Ser 1.03 (*)    GFR calc non Af Amer 48 (*)    GFR calc Af Amer 56 (*)    All other components within normal limits  I-STAT CHEM 8, ED - Abnormal; Notable for the following components:   Calcium, Ion 1.10 (*)    All other components within normal limits  ETHANOL  APTT  CBC  DIFFERENTIAL  RAPID URINE DRUG SCREEN, HOSP PERFORMED  URINALYSIS, ROUTINE W REFLEX MICROSCOPIC  I-STAT TROPONIN, ED  CBG MONITORING, ED    EKG EKG Interpretation  Date/Time:  Monday April 17 2018 12:15:18 EST Ventricular Rate:  82 PR Interval:    QRS Duration: 82 QT Interval:  379 QTC Calculation: 443 R Axis:   85 Text Interpretation:  Atrial fibrillation Borderline right axis deviation Repol abnrm suggests ischemia, anterolateral Confirmed by Malvin Johns [phone]) on 04/17/2018 12:45:44 PM Also confirmed by Malvin Johns [phone]), editor Shon Hale [phone])  on 04/17/2018 1:56:36 PM   Radiology Ct Head Wo Contrast  Result Date: 04/17/2018 CLINICAL DATA:  84 year old female with progressively worsening weakness and difficulty speaking. Symptoms worsened yesterday. Initial encounter. EXAM: CT HEAD WITHOUT CONTRAST TECHNIQUE: Contiguous axial images were obtained from the base of the skull through the vertex without intravenous contrast. COMPARISON:  06/20/2009. FINDINGS: Brain: No intracranial hemorrhage or CT evidence of large acute infarct. Chronic microvascular changes. Global atrophy. Anterior left middle cranial fossa/clinoid 1.2 cm calcified meningioma without surrounding vasogenic edema. Vascular: Vascular calcifications.   No acute hyperdense vessel. Skull: Hyperostosis frontalis interna. Sinuses/Orbits: No acute orbital abnormality. Post lens replacement. Visualized paranasal sinuses are clear. Other: Mastoid air cells and middle ear cavities are clear. IMPRESSION: 1. No intracranial hemorrhage or CT evidence of large acute infarct. 2. Chronic microvascular changes. 3. Global atrophy. 4. Anterior left middle cranial fossa/clinoid 1.2 cm calcified meningioma without surrounding vasogenic edema. Electronically Signed   By: Genia Del M.D.   On: 04/17/2018 13:37    Procedures Procedures (including critical care time)  Medications Ordered in ED Medications  traZODone (DESYREL) tablet 25 mg (has no administration in time range)  ALPRAZolam (XANAX) tablet 0.25 mg (has no administration in time range)  Vitamin D3 TABS 3,000 tablet (has no administration in time range)   stroke: mapping our early stages of recovery book (has no administration in time range)  0.9 %  sodium chloride infusion ( Intravenous New Bag/Given 04/17/18 1630)  acetaminophen (TYLENOL) tablet 650 mg (has no administration in time range)    Or  acetaminophen (TYLENOL) solution 650 mg (has no administration in time range)    Or  acetaminophen (TYLENOL) suppository 650 mg (has no administration in time range)  senna-docusate (Senokot-S) tablet 1 tablet (has no administration in time range)  aspirin suppository 300 mg (has no administration in time range)    Or  aspirin tablet 325 mg (has no administration in time range)  atorvastatin (LIPITOR) tablet 40 mg (has no administration in time range)     Initial Impression / Assessment and Plan / ED Course  I have reviewed the triage vital signs and the nursing notes.  Pertinent labs & imaging results that were available during my care of the patient were reviewed by me and considered in my medical decision making (see chart for details).     Patient is 84 year old female who presents with some  aphasia.  This is associated with some mild difficulty swallowing and feeling off balance.  She feels like it is improving a little bit.  She is out of the window for any TPA.  She does not have any suggestion of large vessel occlusion.  Her head CT does not show any acute abnormalities.  Her labs are non-concerning.  Her EKG shows atrial fibrillation but she is on Coumadin and her INR shows therapeutic levels.  I spoke with Dr. Lorin Mercy with the hospitalist service who will admit the patient for further stroke work-up.  I spoke with Dr. Lorraine Lax who will consult on the pt.  Final Clinical Impressions(s) / ED Diagnoses   Final diagnoses:  Aphasia    ED Discharge Orders    None       Malvin Johns, MD 04/17/18 [phone]

## 2018-04-17 NOTE — ED Notes (Signed)
MRI of brain was requested.  Pt has a pacemaker and when the model was investigated, it is a [employer] Adapta which is not compatible with the use of MRI at this time.  Exam will be cancelled according to the chart notes.

## 2018-04-17 NOTE — Consult Note (Addendum)
Neurology Consultation  Reason for Consult: Slurred speech, left-sided tingling and numbness Referring Physician: Dr. Karmen Bongo  CC: Slurred speech, left-sided tingling left-sided numbness.  History is obtained from: Patient, family at bedside, chart  HPI: Misty Blackwell is a 84 y.o. female who has a past medical history of pacemaker placement, coronary artery disease, hypertension, CHF, atrial fibrillation on Coumadin and presumed history of TIA in the past which she cannot recollect the exact details of, presented with problems of slurred speech and swallowing difficulties as well as some left-sided numbness. She reports that she had a stressful event with her sister who has dementia and who she is a caregiver for on Thursday.  The sister was having hallucinations and patient had to attend to it in a very stressful manner to try to take care of her.  Since then she had started having some trouble with her speech.  She had expressive aphasia that she described some word finding difficulty-could not describe the meal she had had church to her family-knew she had to say spinach but could not find the word for spinach or chicken. She then also noticed that her speech was slurred-as an someone talking when they are drunk. She also noted numbness on the left side of her face.  She did not have any headaches.  She has a history of migraines.  Her migraines are usually ocular and associated with nausea but no headaches.  She has not had any such ocular auras or nausea with this event.  Last known normal is hard to get but probably symptoms have been off and on going on since Thursday, 04/13/2018. When she was evaluated by the medicine team, she also reported feeling a little swimmy and off balance when trying to stand up.  Along with the slurred speech and dysarthria, she also had some dysphagia and was not able to swallow properly. She does endorse a lot of anxiety.  She at baseline is an anxious  person and had required some grief counseling after the passing of her husband a few years ago and feels that currently the things with her sister are at a point which are making her a lot more anxious than she usually is. She denies any chest pain shortness of breath.  She denies any palpitations.  She denies any nausea vomiting.  She denies any bleeding or bruising tendencies. She is currently on Coumadin for A. fib. She was on aspirin at some point but that was discontinued when she started on Coumadin. Followed by cardiology-Dr. Sallyanne Kuster.  LKW: Difficult to obtain- best by history taking 04/13/2018. tpa given?: no, outside the window Premorbid modified Rankin scale (mRS):1 (due to some pre-existing neuropathy of unknown cause)  ROS: ROS was performed and is negative except as noted in the HPI.   Past Medical History:  Diagnosis Date  . Atrial fib/flutter, transient   . Atrial fibrillation, chronic   . CHF (congestive heart failure) ([place]) 10/29/2009   Echo - EF >55%; normal LV size and systolic function; unable to assess diastolic fcn due to E/A fusion, pulmonary vein flow pattern suggests elevated filling pressure; marked biatrail dilation, mild/mod tricuspid regurgitation; mod pulmonary htn; mild/mod mitral regurgitation; although echocardiographic features are incomplete findings suggest possible infiltrative cardiomyopathy (maybe amyloidosi  . Coronary artery disease 03/19/2002   R/P Cardiolite - EF 76%; nromal static and dynamic myocardial perfusion images; normal wall motion and endocardial thickening in all vascular territories  . Facial numbness 12/26/2008   carotid doppler -  R and L ICAs 0-49% diameter reduction (velocities suggest low end of scale)  . Hypertension   . Pacemaker   . Peripheral neuropathy   . Skin cancer    s/p surgical removal.  . TIA (transient ischemic attack)     Family History  Problem Relation Age of Onset  . Heart disease Mother   . Diabetes Mother    . Heart attack Father   . Heart disease Father   . Cirrhosis Brother    Social History:   reports that she has never smoked. She has never used smokeless tobacco. She reports that she does not drink alcohol or use drugs.  Medications  Current Facility-Administered Medications:  .   stroke: mapping our early stages of recovery book, , Does not apply, Once, Karmen Bongo, MD .  0.9 %  sodium chloride infusion, , Intravenous, Continuous, Karmen Bongo, MD, Last Rate: 50 mL/hr at 04/17/18 1800 .  acetaminophen (TYLENOL) tablet 650 mg, 650 mg, Oral, Q4H PRN **OR** acetaminophen (TYLENOL) solution 650 mg, 650 mg, Per Tube, Q4H PRN **OR** acetaminophen (TYLENOL) suppository 650 mg, 650 mg, Rectal, Q4H PRN, Karmen Bongo, MD .  ALPRAZolam Duanne Moron) tablet 0.25 mg, 0.25 mg, Oral, QHS PRN, Karmen Bongo, MD .  aspirin suppository 300 mg, 300 mg, Rectal, Daily **OR** aspirin tablet 325 mg, 325 mg, Oral, Daily, Karmen Bongo, MD, 325 mg at 04/17/18 1817 .  atorvastatin (LIPITOR) tablet 40 mg, 40 mg, Oral, q1800, Karmen Bongo, MD, 40 mg at 04/17/18 1816 .  cholecalciferol (VITAMIN D3) tablet 3,000 Units, 3,000 Units, Oral, Daily, Karmen Bongo, MD .  senna-docusate (Senokot-S) tablet 1 tablet, 1 tablet, Oral, QHS PRN, Karmen Bongo, MD .  traZODone (DESYREL) tablet 25 mg, 25 mg, Oral, QHS PRN, Karmen Bongo, MD .  Warfarin - Pharmacist Dosing Inpatient, , Does not apply, q1800, Rolla Flatten, [facility]  Exam: Current vital signs: BP (!) 161/65 (BP Location: Left Arm)   Pulse 63   Temp 97.9 F (36.6 C) (Oral)   Resp 19   SpO2 97%  Vital signs in last 24 hours: Temp:  [97.9 F (36.6 C)-98.9 F (37.2 C)] 97.9 F (36.6 C) (12/16 1800) Pulse Rate:  [63-86] 63 (12/16 1800) Resp:  [15-21] 19 (12/16 1800) BP: (132-161)/(60-102) 161/65 (12/16 1800) SpO2:  [97 %-100 %] 97 % (12/16 1800) GENERAL: Awake, alert in NAD HEENT: - Normocephalic and atraumatic, dry mm, no LN++, no  Thyromegally LUNGS - Clear to auscultation bilaterally with no wheezes CV - S1S2 RRR, no m/r/g, equal pulses bilaterally. ABDOMEN - Soft, nontender, nondistended with normoactive BS Ext: warm, well perfused, intact peripheral pulses, no edema  NEURO:  Mental Status: AA&Ox3  Language: speech is non-dysarthric.  Naming, repetition, fluency, and comprehension intact.  Was able to read all the words on the NIH stroke card, was able to describe the pictures and name all the objects on the pictures. Cranial Nerves: PERRL. EOMI, visual fields full, no facial asymmetry, facial sensation intact, hearing intact, tongue/uvula/soft palate midline, normal sternocleidomastoid and trapezius muscle strength. No evidence of tongue atrophy or fibrillations Motor: Grossly symmetric 5/5 in all 4 extremities with no drift. Tone: is normal and bulk is normal Sensation-decreased sensation on the left on top of pre-existing glove and stocking pattern decreased sensation in both lower extremities Coordination: Mildly ataxic left finger-to-nose.  Intact right finger-to-nose. Gait- deferred  NIHSS - 2 for ataxia and sensory   Labs I have reviewed labs in epic and the results pertinent to this consultation  are: CBC    Component Value Date/Time   WBC 9.0 04/17/2018 1227   RBC 4.61 04/17/2018 1227   HGB 14.6 04/17/2018 1235   HCT 43.0 04/17/2018 1235   PLT 206 04/17/2018 1227   MCV 94.6 04/17/2018 1227   MCH 29.9 04/17/2018 1227   MCHC 31.7 04/17/2018 1227   RDW 12.7 04/17/2018 1227   LYMPHSABS 2.6 04/17/2018 1227   MONOABS 0.9 04/17/2018 1227   EOSABS 0.1 04/17/2018 1227   BASOSABS 0.1 04/17/2018 1227    CMP     Component Value Date/Time   NA 140 04/17/2018 1235   NA 143 08/18/2017 1013   K 4.0 04/17/2018 1235   CL 104 04/17/2018 1235   CO2 28 04/17/2018 1227   GLUCOSE 89 04/17/2018 1235   BUN 18 04/17/2018 1235   BUN 27 08/18/2017 1013   CREATININE 0.90 04/17/2018 1235   CREATININE 0.78  02/22/2018 1058   CALCIUM 9.1 04/17/2018 1227   PROT 7.4 04/17/2018 1227   ALBUMIN 3.8 04/17/2018 1227   AST 22 04/17/2018 1227   ALT 18 04/17/2018 1227   ALKPHOS 90 04/17/2018 1227   BILITOT 0.9 04/17/2018 1227   GFRNONAA 48 (L) 04/17/2018 1227   GFRNONAA 68 02/22/2018 1058   GFRAA 56 (L) 04/17/2018 1227   GFRAA 79 02/22/2018 1058   Imaging I have reviewed the images obtained:  CT-scan of the brain-no acute changes.  Left middle cranial fossa meningioma that is calcified and stable from before.  No vasogenic edema  MRI cannot be done due to noncompatible pacemaker  Assessment:  84 year old woman with past medical history of pacemaker placement, coronary artery disease, hypertension, CHF, atrial fibrillation on Coumadin and TIA with unknown details in the past, presenting with symptoms of dysarthria, left-sided facial tingling and numbness, on exam left finger-to-nose ataxia and some decrease sensation on the left, with last known normal Thursday, 04/13/2018 with symptoms improving and currently mild deficits with an NIH stroke scale of 2 for left arm ataxia and mild sensory decrease on the left side. There is some component of anxiety in her history but I do suspect that she might have had a small cerebellar stroke based on her exam.   Noncontrast CT did not reveal any acute changes.  If this were a large stroke, I would have expected to see some changes given the last known normal since 04/13/2018.  Impression: Stroke versus TIA Anxiety   Recommendations: -I would agree with observation admission. -2D echocardiogram -CTA head and neck -Repeat CT in 24 hours from the last CT to see if there are any changes in the cerebellum. -Hemoglobin A1c -LDL -PT -OT -Speech therapy -She is already cleared swallow evaluation bedside and was eating a meal when I saw her.  Consider formal evaluation by speech therapy if she has difficulty swallowing going forward. -Telemetry -Consider  outpatient counseling/psychiatry consult for anxiety has some symptoms might be related to anxiety with current situation with the family member who has presumably advanced dementia.  She can continue to be on her Coumadin for now as the stroke, if truly present, is small and less risk for hemorrhagic conversion. Stroke team to decide whether to change anticoagulation to a newer anticoagulant versus continuing Coumadin going forward based on further results.  There might be a need to add an antiplatelet should CTA head and neck revealed significant intracranial atherosclerosis.  Will defer to the stroke team rounding in the morning.   -- Amie Portland, MD [facility] Pager: [phone]  If 7pm to 7am, please call on call as listed on AMION.

## 2018-04-17 NOTE — ED Notes (Signed)
Pt transported to CT ?

## 2018-04-18 ENCOUNTER — Other Ambulatory Visit: Payer: Self-pay

## 2018-04-18 ENCOUNTER — Observation Stay (HOSPITAL_BASED_OUTPATIENT_CLINIC_OR_DEPARTMENT_OTHER): Payer: Medicare Other

## 2018-04-18 ENCOUNTER — Observation Stay (HOSPITAL_COMMUNITY): Payer: Medicare Other

## 2018-04-18 DIAGNOSIS — Z7901 Long term (current) use of anticoagulants: Secondary | ICD-10-CM

## 2018-04-18 DIAGNOSIS — I10 Essential (primary) hypertension: Secondary | ICD-10-CM | POA: Diagnosis not present

## 2018-04-18 DIAGNOSIS — I5032 Chronic diastolic (congestive) heart failure: Secondary | ICD-10-CM

## 2018-04-18 DIAGNOSIS — Z950 Presence of cardiac pacemaker: Secondary | ICD-10-CM

## 2018-04-18 DIAGNOSIS — E782 Mixed hyperlipidemia: Secondary | ICD-10-CM

## 2018-04-18 DIAGNOSIS — I361 Nonrheumatic tricuspid (valve) insufficiency: Secondary | ICD-10-CM

## 2018-04-18 DIAGNOSIS — M9981 Other biomechanical lesions of cervical region: Secondary | ICD-10-CM | POA: Diagnosis not present

## 2018-04-18 DIAGNOSIS — M4802 Spinal stenosis, cervical region: Secondary | ICD-10-CM | POA: Diagnosis present

## 2018-04-18 LAB — PROTIME-INR
INR: 1.84
Prothrombin Time: 21 seconds — ABNORMAL HIGH (ref 11.4–15.2)

## 2018-04-18 LAB — CBC
HEMATOCRIT: 40.3 % (ref 36.0–46.0)
Hemoglobin: 12.8 g/dL (ref 12.0–15.0)
MCH: 30 pg (ref 26.0–34.0)
MCHC: 31.8 g/dL (ref 30.0–36.0)
MCV: 94.4 fL (ref 80.0–100.0)
Platelets: 184 10*3/uL (ref 150–400)
RBC: 4.27 MIL/uL (ref 3.87–5.11)
RDW: 12.7 % (ref 11.5–15.5)
WBC: 6.7 10*3/uL (ref 4.0–10.5)
nRBC: 0 % (ref 0.0–0.2)

## 2018-04-18 LAB — ECHOCARDIOGRAM COMPLETE

## 2018-04-18 MED ORDER — ASPIRIN 325 MG PO TABS: 325.0000 mg | ORAL_TABLET | Freq: Every day | ORAL | 0 refills | Status: DC

## 2018-04-18 MED ORDER — PRAVASTATIN SODIUM 20 MG PO TABS: 20.0000 mg | ORAL_TABLET | Freq: Every day | ORAL | 0 refills | Status: DC

## 2018-04-18 MED ORDER — WARFARIN SODIUM 4 MG PO TABS
4.5000 mg | ORAL_TABLET | Freq: Once | ORAL | Status: AC
  Administered 2018-04-18: 4.5 mg via ORAL
  Filled 2018-04-18: qty 1

## 2018-04-18 MED ORDER — PRAVASTATIN SODIUM 10 MG PO TABS
20.0000 mg | ORAL_TABLET | Freq: Every day | ORAL | Status: DC
  Administered 2018-04-18: 20 mg via ORAL
  Filled 2018-04-18: qty 2

## 2018-04-18 NOTE — Plan of Care (Signed)
  Problem: Ischemic Stroke/TIA Tissue Perfusion: °Goal: Complications of ischemic stroke/TIA will be minimized °Outcome: Completed/Met °  °

## 2018-04-18 NOTE — Progress Notes (Signed)
  Echocardiogram 2D Echocardiogram has been performed.  Misty Blackwell 04/18/2018, 9:07 AM

## 2018-04-18 NOTE — Progress Notes (Signed)
PT Cancellation Note  Patient Details Name: Misty Blackwell MRN: [id number] DOB: 1927/10/12   Cancelled Treatment:    Reason Eval/Treat Not Completed: Patient at procedure or test/unavailable. Pt currently with ECHO in room. Will check back.    Thelma Comp 04/18/2018, 8:54 AM   Rolinda Roan, PT, DPT Acute Rehabilitation Services Pager: [phone] Office: [phone]

## 2018-04-18 NOTE — Care Management Note (Signed)
Case Management Note  Patient Details  Name: CAROLYNA YERIAN MRN: [id number] Date of Birth: Jun 17, 1927  Subjective/Objective:  84yo female presented with slurred speech and L-side numbness.                 Action/Plan: CM met with patient to discuss dispositional needs. Patient states living at home alone, independent with ADLs with no AD in use. PCP verified as: Dr. [name]; pharmacy of choice: [facility]. PT recommended HHPT with patient declining at this time. CM informed patient to discuss [place] with her PCP post-transition if needed for mobility, with patient verbalizing understanding. Patient indicated her niece lives 1 mile away and will be available to assist as needed and provide transportation home. No further needs from CM.  Expected Discharge Date:                  Expected Discharge Plan:  Home/Self Care  In-House Referral:  NA  Discharge planning Services  CM Consult  Post Acute Care Choice:  NA Choice offered to:  NA  DME Arranged:  N/A DME Agency:  NA  HH Arranged:  NA HH Agency:  NA  Status of Service:  Completed, signed off  If discussed at [place] of Stay Meetings, dates discussed:    Additional Comments:  Midge Minium RN, BSN, NCM-BC, ACM-RN [phone] 04/18/2018, 3:45 PM

## 2018-04-18 NOTE — Progress Notes (Signed)
STROKE TEAM PROGRESS NOTE   SUBJECTIVE (INTERVAL HISTORY) Her RN is at the bedside.  Overall her condition is completely resolved. Pt stated that she has a lot of stress at home, she has been taking care of her sister who has advanced dementia for years. Recently sister developed bad hallucination and pt was not able to handle it anymore. For the last 2 weeks, she has been having a lot of symptoms including generalized weakness, intermittent slurry speech, stumbling and gait imbalance, left facial numbness, etc. Since admission, she felt her symptoms resolved and her sister is now taken care by her niece and pt felt much better and plan to go to [place] to spend holiday with her son.    OBJECTIVE Temp:  [97.7 F (36.5 C)-98.1 F (36.7 C)] 97.9 F (36.6 C) (12/17 1025) Pulse Rate:  [63-64] 63 (12/17 0355) Cardiac Rhythm: Atrial fibrillation (12/17 0355) Resp:  [15-20] 20 (12/17 1025) BP: (136-161)/(59-97) 149/97 (12/17 1025) SpO2:  [96 %-97 %] 97 % (12/17 1025)  Recent Labs  Lab 04/17/18 1218  GLUCAP 93   Recent Labs  Lab 04/17/18 1227 04/17/18 1235  NA 142 140  K 4.1 4.0  CL 104 104  CO2 28  --   GLUCOSE 92 89  BUN 16 18  CREATININE 1.03* 0.90  CALCIUM 9.1  --    Recent Labs  Lab 04/17/18 1227  AST 22  ALT 18  ALKPHOS 90  BILITOT 0.9  PROT 7.4  ALBUMIN 3.8   Recent Labs  Lab 04/17/18 1227 04/17/18 1235 04/18/18 0530  WBC 9.0  --  6.7  NEUTROABS 5.3  --   --   HGB 13.8 14.6 12.8  HCT 43.6 43.0 40.3  MCV 94.6  --  94.4  PLT 206  --  184   No results for input(s): CKTOTAL, CKMB, CKMBINDEX, TROPONINI in the last 168 hours. Recent Labs    04/17/18 1227 04/18/18 0530  LABPROT 24.7* 21.0*  INR 2.27 1.84   No results for input(s): COLORURINE, LABSPEC, PHURINE, GLUCOSEU, HGBUR, BILIRUBINUR, KETONESUR, PROTEINUR, UROBILINOGEN, NITRITE, LEUKOCYTESUR in the last 72 hours.  Invalid input(s): APPERANCEUR     Component Value Date/Time   CHOL 167 02/22/2018 1058    TRIG 118 02/22/2018 1058   HDL 41 (L) 02/22/2018 1058   CHOLHDL 4.1 02/22/2018 1058   VLDL 34 (H) 09/28/2016 1125   LDLCALC 105 (H) 02/22/2018 1058   Lab Results  Component Value Date   HGBA1C 5.7 (H) 02/22/2018   No results found for: LABOPIA, [place], [place], [place], [place], [place]  Recent Labs  Lab 04/17/18 [place] <10    I have personally reviewed the radiological images below and agree with the radiology interpretations.  Ct Angio Head W Or Wo Contrast  Result Date: 04/17/2018 CLINICAL DATA:  Transient ischemic attack. History of atrial fibrillation, hypertension. EXAM: CT ANGIOGRAPHY HEAD AND NECK TECHNIQUE: Multidetector CT imaging of the head and neck was performed using the standard protocol during bolus administration of intravenous contrast. Multiplanar CT image reconstructions and MIPs were obtained to evaluate the vascular anatomy. Carotid stenosis measurements (when applicable) are obtained utilizing NASCET criteria, using the distal internal carotid diameter as the denominator. CONTRAST:  127mL ISOVUE-370 IOPAMIDOL (ISOVUE-370) INJECTION 76% COMPARISON:  CT Head April 17, 2018 and MRA head May 12, 2004. FINDINGS: CTA NECK FINDINGS: AORTIC ARCH: Normal appearance of the thoracic arch, normal branch pattern. Moderate intimal thickening calcific atherosclerosis. The origins of the innominate, left Common carotid artery and subclavian  artery are widely patent. RIGHT CAROTID SYSTEM: Common carotid artery is patent. Normal appearance of the carotid bifurcation without hemodynamically significant stenosis by NASCET criteria. Normal appearance of the internal carotid artery. LEFT CAROTID SYSTEM: Common carotid artery is patent. Normal appearance of the carotid bifurcation without hemodynamically significant stenosis by NASCET criteria. Normal appearance of the internal carotid artery. VERTEBRAL ARTERIES:Left vertebral artery arises directly from the aortic arch, normal  variant. RIGHT vertebral artery is dominant. Patent vertebral arteries. SKELETON: No acute osseous process though bone windows have not been submitted. Severe C4-5 degenerative disc. Severe RIGHT C4-5 neural foraminal narrowing. OTHER NECK: Soft tissues of the neck are nonacute though, not tailored for evaluation. RIGHT base of tongue cystic mass without suspicious features most consistent with mucosal retention cyst. UPPER CHEST: Enlarged main pulmonary artery seen with pulmonary arterial hypertension. Mosaic attenuation lung apices seen with small airway disease. Streak artifact from LEFT pacemaker. No superior mediastinal lymphadenopathy. CTA HEAD FINDINGS: ANTERIOR CIRCULATION: Patent cervical internal carotid arteries, petrous, cavernous and supra clinoid internal carotid arteries. Patent anterior communicating artery. Patent anterior and middle cerebral arteries. No large vessel occlusion, significant stenosis, contrast extravasation or aneurysm. POSTERIOR CIRCULATION: Patent vertebral arteries, vertebrobasilar junction and basilar artery, as well as main branch vessels. Patent posterior cerebral arteries. Small LEFT posterior communicating artery present. No large vessel occlusion, significant stenosis, contrast extravasation or aneurysm. VENOUS SINUSES: Major dural venous sinuses are patent though not tailored for evaluation on this angiographic examination. ANATOMIC VARIANTS: None. DELAYED PHASE: Mild enhancement LEFT paraclinoid meningioma. MIP images reviewed. IMPRESSION: CTA NECK: 1. No hemodynamically significant stenosis ICA's. 2. Patent vertebral artery. 3. Severe RIGHT C4-5 neural foraminal narrowing. CTA HEAD: 1. No emergent large vessel occlusion or flow-limiting stenosis. Aortic Atherosclerosis (ICD10-I70.0). Electronically Signed   By: Elon Alas M.D.   On: 04/17/2018 22:37   Ct Head Wo Contrast  Result Date: 04/17/2018 CLINICAL DATA:  84 year old female with progressively worsening  weakness and difficulty speaking. Symptoms worsened yesterday. Initial encounter. EXAM: CT HEAD WITHOUT CONTRAST TECHNIQUE: Contiguous axial images were obtained from the base of the skull through the vertex without intravenous contrast. COMPARISON:  06/20/2009. FINDINGS: Brain: No intracranial hemorrhage or CT evidence of large acute infarct. Chronic microvascular changes. Global atrophy. Anterior left middle cranial fossa/clinoid 1.2 cm calcified meningioma without surrounding vasogenic edema. Vascular: Vascular calcifications.  No acute hyperdense vessel. Skull: Hyperostosis frontalis interna. Sinuses/Orbits: No acute orbital abnormality. Post lens replacement. Visualized paranasal sinuses are clear. Other: Mastoid air cells and middle ear cavities are clear. IMPRESSION: 1. No intracranial hemorrhage or CT evidence of large acute infarct. 2. Chronic microvascular changes. 3. Global atrophy. 4. Anterior left middle cranial fossa/clinoid 1.2 cm calcified meningioma without surrounding vasogenic edema. Electronically Signed   By: Genia Del M.D.   On: 04/17/2018 13:37   Ct Angio Neck W Or Wo Contrast  Result Date: 04/17/2018 CLINICAL DATA:  Transient ischemic attack. History of atrial fibrillation, hypertension. EXAM: CT ANGIOGRAPHY HEAD AND NECK TECHNIQUE: Multidetector CT imaging of the head and neck was performed using the standard protocol during bolus administration of intravenous contrast. Multiplanar CT image reconstructions and MIPs were obtained to evaluate the vascular anatomy. Carotid stenosis measurements (when applicable) are obtained utilizing NASCET criteria, using the distal internal carotid diameter as the denominator. CONTRAST:  155mL ISOVUE-370 IOPAMIDOL (ISOVUE-370) INJECTION 76% COMPARISON:  CT Head April 17, 2018 and MRA head May 12, 2004. FINDINGS: CTA NECK FINDINGS: AORTIC ARCH: Normal appearance of the thoracic arch, normal branch pattern. Moderate intimal  thickening calcific  atherosclerosis. The origins of the innominate, left Common carotid artery and subclavian artery are widely patent. RIGHT CAROTID SYSTEM: Common carotid artery is patent. Normal appearance of the carotid bifurcation without hemodynamically significant stenosis by NASCET criteria. Normal appearance of the internal carotid artery. LEFT CAROTID SYSTEM: Common carotid artery is patent. Normal appearance of the carotid bifurcation without hemodynamically significant stenosis by NASCET criteria. Normal appearance of the internal carotid artery. VERTEBRAL ARTERIES:Left vertebral artery arises directly from the aortic arch, normal variant. RIGHT vertebral artery is dominant. Patent vertebral arteries. SKELETON: No acute osseous process though bone windows have not been submitted. Severe C4-5 degenerative disc. Severe RIGHT C4-5 neural foraminal narrowing. OTHER NECK: Soft tissues of the neck are nonacute though, not tailored for evaluation. RIGHT base of tongue cystic mass without suspicious features most consistent with mucosal retention cyst. UPPER CHEST: Enlarged main pulmonary artery seen with pulmonary arterial hypertension. Mosaic attenuation lung apices seen with small airway disease. Streak artifact from LEFT pacemaker. No superior mediastinal lymphadenopathy. CTA HEAD FINDINGS: ANTERIOR CIRCULATION: Patent cervical internal carotid arteries, petrous, cavernous and supra clinoid internal carotid arteries. Patent anterior communicating artery. Patent anterior and middle cerebral arteries. No large vessel occlusion, significant stenosis, contrast extravasation or aneurysm. POSTERIOR CIRCULATION: Patent vertebral arteries, vertebrobasilar junction and basilar artery, as well as main branch vessels. Patent posterior cerebral arteries. Small LEFT posterior communicating artery present. No large vessel occlusion, significant stenosis, contrast extravasation or aneurysm. VENOUS SINUSES: Major dural venous sinuses are  patent though not tailored for evaluation on this angiographic examination. ANATOMIC VARIANTS: None. DELAYED PHASE: Mild enhancement LEFT paraclinoid meningioma. MIP images reviewed. IMPRESSION: CTA NECK: 1. No hemodynamically significant stenosis ICA's. 2. Patent vertebral artery. 3. Severe RIGHT C4-5 neural foraminal narrowing. CTA HEAD: 1. No emergent large vessel occlusion or flow-limiting stenosis. Aortic Atherosclerosis (ICD10-I70.0). Electronically Signed   By: Elon Alas M.D.   On: 04/17/2018 22:37    CT head repeat pending   PHYSICAL EXAM  Temp:  [97.7 F (36.5 C)-98.1 F (36.7 C)] 97.9 F (36.6 C) (12/17 1025) Pulse Rate:  [63-64] 63 (12/17 0355) Resp:  [15-20] 20 (12/17 1025) BP: (136-161)/(59-97) 149/97 (12/17 1025) SpO2:  [96 %-97 %] 97 % (12/17 1025)  General - Well nourished, well developed, in no apparent distress.  Ophthalmologic - fundi not visualized due to noncooperation.  Cardiovascular - irregularly irregular heart rate and rhythm.  Mental Status -  Level of arousal and orientation to time, place, and person were intact. Language including expression, naming, repetition, comprehension was assessed and found intact.  Cranial Nerves II - XII - II - Visual field intact OU. III, IV, VI - Extraocular movements intact. V - Facial sensation intact bilaterally. VII - Facial movement intact bilaterally. VIII - Hearing & vestibular intact bilaterally. X - Palate elevates symmetrically. XI - Chin turning & shoulder shrug intact bilaterally. XII - Tongue protrusion intact  Motor Strength - The patient's strength was normal in all extremities and pronator drift was absent.  Bulk was normal and fasciculations were absent.   Motor Tone - Muscle tone was assessed at the neck and appendages and was normal.  Reflexes - The patient's reflexes were symmetrical in all extremities and she had no pathological reflexes.  Sensory - Light touch, temperature/pinprick were  assessed and were symmetrical.    Coordination - The patient had normal movements in the hands and feet with no ataxia or dysmetria.  Tremor was absent.  Gait and Station - deferred.  ASSESSMENT/PLAN Ms. WANIYA HOGLUND is a 84 y.o. female with history of Afib on coumadin, CHF, CAD, HTN, pacemaker, TIA admitted for intermittent slurry speech, left facial droop, generalized weakness, gait imbalance for 2 weeks in the setting of tremendous stress. No tPA given due to rapid resolving symptoms.    Stress and anxiety - less likely TIA or stroke  Resultant back to baseline  CT no acute abnormalities, small left middle cranial fossa calcified meningioma  CTA head and neck unremarkable  CT repeat pending, low suspicious for stroke  2D Echo EF 65 to 70%  LDL 105 in 01/2018  HgbA1c 5.7 in 01/2018  Warfarin for VTE prophylaxis  warfarin daily prior to admission, now on ASA 325 and warfarin daily.  Current INR 1.84, okay to bridge Coumadin with aspirin 325.  Once INR at a goal 2-3, aspirin 325 can be discontinued.  Discussed with patient about switch to DOAC, patient would like to discuss with her cardiologist before switching.  Patient counseled to be compliant with her antithrombotic medications  Ongoing aggressive stroke risk factor management  Therapy recommendations:  Pending   Disposition:  Pending   Chronic afib  On Coumadin at home  Admission INR 2.27  This morning INR 1.84.  Okay to bridge Coumadin with aspirin 325.  Once INR at goal 2-3, aspirin can be discontinued.  Discussed with patient about switching to DOAC, however, patient would like to discuss with her cardiologist before switching  Hypertension . Stable  Long term BP goal normotensive  Hyperlipidemia  Home meds: None  LDL 105, goal < 70  Now on pravastatin 20  Continue statin at discharge  Other Stroke Risk Factors  Advanced age  CHF  CAD  Other Active Problems  Home stress  Follow  with Dr. Sallyanne Kuster at cardiology  Hospital day # 0  Neurology will sign off. Please call with questions. No neuro follow up needed at this time. Thanks for the consult.  Rosalin Hawking, MD PhD Stroke Neurology 04/18/2018 1:42 PM    To contact Stroke Continuity provider, please refer to [email or web address] After hours, contact General Neurology

## 2018-04-18 NOTE — Progress Notes (Signed)
Discharge instructions, RX's and follow up appts explained and provided to patient and c/g verbalized understanding. Patient left floor via wheelchair accompanied by staff.  Wilgus Deyton, Tivis Ringer, RN

## 2018-04-18 NOTE — Progress Notes (Signed)
OT Cancellation Note  Patient Details Name: Misty Blackwell MRN: [id number] DOB: [birth year]   Cancelled Treatment:    Reason Eval/Treat Not Completed: Patient at procedure or test/ unavailable(ECHO. Will return as schedule allows. Thank you)  [facility], OTR/L Acute Rehab Pager: [phone] Office: [phone]  04/18/2018, 8:33 AM

## 2018-04-18 NOTE — Care Management Obs Status (Signed)
[facility] NOTIFICATION   Patient Details  Name: Misty Blackwell MRN: [id number] Date of Birth: 06-07-1927   Medicare Observation Status Notification Given:  Yes    Midge Minium RN, BSN, NCM-BC, ACM-RN [phone] 04/18/2018, 3:37 PM

## 2018-04-18 NOTE — Evaluation (Signed)
Physical Therapy Evaluation Patient Details Name: Misty Blackwell MRN: [id number] DOB: [birth year] Today's Date: 04/18/2018   History of Present Illness  Pt is an 84 y/o female with a PMH significant for pacemaker placement, CAD, HTN, CHF, a-fib on Coumadin and presumed history of TIA. She presents with slurred speech and L-side numbness.   Clinical Impression  Pt admitted with above diagnosis. Pt currently with functional limitations due to the deficits listed below (see PT Problem List). At the time of PT eval pt was able to perform transfers and ambulation with gross supervision for safety and no AD. Pt with a seemingly good understanding of her current medical issues, however feel she has a slightly decreased awareness of her current balance deficits and safety. Recommending HHPT for higher level balance training to decrease risk for falls and maximize functional independence in her home environment. Pt agreeable. Acutely, pt will benefit from skilled PT to increase their independence and safety with mobility to allow discharge to the venue listed below.       Follow Up Recommendations Home health PT;Supervision for mobility/OOB    Equipment Recommendations  None recommended by PT    Recommendations for Other Services       Precautions / Restrictions Precautions Precautions: Fall Restrictions Weight Bearing Restrictions: No      Mobility  Bed Mobility Overal bed mobility: Needs Assistance Bed Mobility: Supine to Sit     Supine to sit: Supervision     General bed mobility comments: Increased time and supervision provided for safety.   Transfers Overall transfer level: Needs assistance Equipment used: None Transfers: Sit to/from Stand Sit to Stand: Supervision         General transfer comment: Supervision for safety as pt powered up to full stand. No overt LOB noted.   Ambulation/Gait Ambulation/Gait assistance: Supervision Gait Distance (Feet): 400 Feet Assistive  device: None Gait Pattern/deviations: Step-through pattern;Decreased stride length;Staggering left Gait velocity: Slightly decreased Gait velocity interpretation: 1.31 - 2.62 ft/sec, indicative of limited community ambulator General Gait Details: Pt with 1 LOB to the L while ambulating. Pt had turned her head to the R to talk to therapist and staggered L. Upon further testing with head turns, no obvious imbalances noted. Initially holding on to wall and door frame for support while in bathroom, however progressed to no UE support by end of session.   Stairs            Wheelchair Mobility    Modified Rankin (Stroke Patients Only)       Balance Overall balance assessment: Needs assistance Sitting-balance support: Feet supported;No upper extremity supported Sitting balance-Leahy Scale: Fair     Standing balance support: No upper extremity supported;During functional activity Standing balance-Leahy Scale: Fair                               Pertinent Vitals/Pain Pain Assessment: No/denies pain    Home Living Family/patient expects to be discharged to:: Private residence Living Arrangements: Alone Available Help at Discharge: Family;Neighbor;Available PRN/intermittently Type of Home: House([place]) Home Access: Stairs to enter Entrance Stairs-Rails: Right;Left;Can reach both Entrance Stairs-Number of Steps: 3 Home Layout: One level Home Equipment: Grab bars - tub/shower      Prior Function Level of Independence: Independent         Comments: Pt drives     Hand Dominance        Extremity/Trunk Assessment   Upper Extremity Assessment Upper Extremity  Assessment: Defer to OT evaluation    Lower Extremity Assessment Lower Extremity Assessment: Overall WFL for tasks assessed    Cervical / Trunk Assessment Cervical / Trunk Assessment: Normal  Communication   Communication: No difficulties  Cognition Arousal/Alertness: Awake/alert Behavior  During Therapy: WFL for tasks assessed/performed Overall Cognitive Status: Within Functional Limits for tasks assessed                                        General Comments      Exercises     Assessment/Plan    PT Assessment Patient needs continued PT services  PT Problem List Decreased activity tolerance;Decreased balance;Decreased mobility;Decreased knowledge of use of DME;Decreased safety awareness;Decreased knowledge of precautions       PT Treatment Interventions DME instruction;Gait training;Functional mobility training;Therapeutic activities;Therapeutic exercise;Neuromuscular re-education;Patient/family education    PT Goals (Current goals can be found in the Care Plan section)  Acute Rehab PT Goals Patient Stated Goal: Return to her home alone PT Goal Formulation: With patient Time For Goal Achievement: 04/25/18 Potential to Achieve Goals: Good    Frequency Min 3X/week   Barriers to discharge Decreased caregiver support Lives alone    Co-evaluation PT/OT/SLP Co-Evaluation/Treatment: Yes Reason for Co-Treatment: To address functional/ADL transfers;Other (comment)(Limited availability of pt 2 testing and staff in room)           AM-PAC PT "6 Clicks" Mobility  Outcome Measure Help needed turning from your back to your side while in a flat bed without using bedrails?: None Help needed moving from lying on your back to sitting on the side of a flat bed without using bedrails?: None Help needed moving to and from a bed to a chair (including a wheelchair)?: None Help needed standing up from a chair using your arms (e.g., wheelchair or bedside chair)?: None Help needed to walk in hospital room?: A Little Help needed climbing 3-5 steps with a railing? : A Little 6 Click Score: 22    End of Session Equipment Utilized During Treatment: Gait belt Activity Tolerance: Patient tolerated treatment well Patient left: in chair;with call bell/phone within  reach Nurse Communication: Mobility status PT Visit Diagnosis: Unsteadiness on feet (R26.81)    Time: 9735-3299 PT Time Calculation (min) (ACUTE ONLY): 21 min   Charges:   PT Evaluation $PT Eval Moderate Complexity: 1 Mod          Rolinda Roan, PT, DPT Acute Rehabilitation Services Pager: [phone] Office: [phone]   Thelma Comp 04/18/2018, 11:48 AM

## 2018-04-18 NOTE — Progress Notes (Addendum)
ANTICOAGULATION CONSULT NOTE - Initial Consult  Pharmacy Consult for Warfarin Indication: atrial fibrillation  Allergies  Allergen Reactions  . Latex Itching  . Ace Inhibitors Other (See Comments)    Unknown reaction  . Acrylic Polymer [Carbomer] Other (See Comments)  . Augmentin [Amoxicillin-Pot Clavulanate] Other (See Comments)  . Ciprofloxacin Other (See Comments)  . Levaquin [Levofloxacin In D5w] Other (See Comments)  . Zocor [Simvastatin] Other (See Comments)  . Chocolate Other (See Comments)    migraine's   . Gabapentin Other (See Comments)    Unsteady gait     Patient Measurements:   Heparin Dosing Weight:   Vital Signs: Temp: 97.7 F (36.5 C) (12/17 0355) Temp Source: Oral (12/17 0355) BP: 136/59 (12/17 0355) Pulse Rate: 63 (12/17 0355)  Labs: Recent Labs    04/17/18 1227 04/17/18 1235 04/18/18 0530  HGB 13.8 14.6 12.8  HCT 43.6 43.0 40.3  PLT 206  --  184  APTT 31  --   --   LABPROT 24.7*  --  21.0*  INR 2.27  --  1.84  CREATININE 1.03* 0.90  --     CrCl cannot be calculated (Unknown ideal weight.).   Medical History: Past Medical History:  Diagnosis Date  . Atrial fib/flutter, transient   . Atrial fibrillation, chronic   . CHF (congestive heart failure) ([place]) 10/29/2009   Echo - EF >55%; normal LV size and systolic function; unable to assess diastolic fcn due to E/A fusion, pulmonary vein flow pattern suggests elevated filling pressure; marked biatrail dilation, mild/mod tricuspid regurgitation; mod pulmonary htn; mild/mod mitral regurgitation; although echocardiographic features are incomplete findings suggest possible infiltrative cardiomyopathy (maybe amyloidosi  . Coronary artery disease 03/19/2002   R/P Cardiolite - EF 76%; nromal static and dynamic myocardial perfusion images; normal wall motion and endocardial thickening in all vascular territories  . Facial numbness 12/26/2008   carotid doppler - R and L ICAs 0-49% diameter reduction  (velocities suggest low end of scale)  . Hypertension   . Pacemaker   . Peripheral neuropathy   . Skin cancer    s/p surgical removal.  . TIA (transient ischemic attack)     Assessment: 80 YOF who presented on 12/16 with weakness and concern for TIA/CVA. The patient was on warfarin PTA for hx Afib. Admit INR 2.27 on PTA dose. INR today 1.84  PTA dose of warfarin 4.5mg  on Monday, Wednesdays, Fridays, and Saturdays and 3mg  on Tuesdays, Thursdays, and Sundays  Goal of Therapy:  INR 2-3 Monitor platelets by anticoagulation protocol: Yes   Plan:  - Warfarin 4.5 mg x 1 dose at 1800 today - Will continue to monitor for any signs/symptoms of bleeding and will follow up with PT/INR in the a.m.   Nevah Dalal A. Levada Dy, PharmD, [place] Pager: [phone] Please utilize [facility]ion for appropriate phone number to reach the unit pharmacist ([place])   04/18/2018 8:25 AM

## 2018-04-18 NOTE — Discharge Summary (Signed)
Physician Discharge Summary  Misty Blackwell FXT:[id number] DOB: 29-May-1927 DOA: 04/17/2018  PCP: [name], MD  Admit date: 04/17/2018 Discharge date: 04/18/2018  Admitted From: Home Disposition: Home  Recommendations for Outpatient Follow-up:  1. Follow up with PCP in 1-2 weeks 2. Please obtain BMP/CBC in one week  Home Health: No Equipment/Devices: None  Discharge Condition: Stable CODE STATUS: Full code Diet recommendation: Heart healthy  Brief/Interim Summary: Misty Blackwell is a 84 y.o. female with medical history significant of TIA; pacemaker placement; CAD; HTN; CHF; and afib on Coumadin presenting with weakness.  She was having some problems with swallowing and speech - expressive aphasia.  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  Occasional word slurring.  She has been off balance for a little while off and on, but today she looks like she is "swimmy" when trying to stand up.  She feels dysphagia but it is her mandibular region, not lower.  She had difficulty swallowing everything and she still notices it subtly.  Her niece noticed some slurring in the middle of the word and some words that she couldn't come up with.  Her left mouth was tingling for a while.  No arm/legs symptoms.  She had been caring for her sister and she had to live with her daughter and she is having hallucinations - it has been very sad and stressful.  She thinks her symptoms started then, last Thursday, and has been progressively worsening since. The morning after admission her symptoms had completely resolved and her work-up was very much unremarkable.  It is felt that her symptoms were related to stress due to her ill sister.  At this point patient is stable for discharge home.  Neurology is recommending switching to a DO [facility] however patient would like to discuss it with her cardiologist prior to switching.  With Dr. Carolyn Stare, her primary care physician, as soon as possible in the  next week.  Patient has reached maximal benefit of hospitalization.  Discharge diagnosis, prognosis, plans, follow-up, medications and treatments discussed with the patient(or responsible party) and is in agreement with the plans as described.  Patient is stable for discharge.  Discharge Diagnoses:  Principal Problem:   TIA (transient ischemic attack) Active Problems:   Neural foraminal stenosis of cervical spine   Hyperlipidemia   Essential hypertension   Long term current use of anticoagulant therapy   Pacemaker   Chronic diastolic heart failure (HCC)   Anxiety   Atrial fibrillation, chronic    Discharge Instructions  Discharge Instructions    Diet - low sodium heart healthy   Complete by:  As directed    Increase activity slowly   Complete by:  As directed      Allergies as of 04/18/2018      Reactions   Latex Itching   Ace Inhibitors Other (See Comments)   Unknown reaction   Acrylic Polymer [carbomer] Other (See Comments)   Augmentin [amoxicillin-pot Clavulanate] Other (See Comments)   Ciprofloxacin Other (See Comments)   Levaquin [levofloxacin In D5w] Other (See Comments)   Zocor [simvastatin] Other (See Comments)   Chocolate Other (See Comments)   migraine's    Gabapentin Other (See Comments)   Unsteady gait       Medication List    TAKE these medications   ALPRAZolam 1 MG tablet Commonly known as:  XANAX Take 1/2-1 tablet 1 to 2 x /day ONLY if needed for Anxiety Attack &  Please try to limit to  5 days  /week to avoid addiction What changed:    how much to take  when to take this  reasons to take this  additional instructions   aspirin 325 MG tablet Take 1 tablet (325 mg total) by mouth daily. Stop when blood thinner at goal (INR 2.5 or greater) Start taking on:  April 19, 2018   BIOTIN PO Take 1 tablet by mouth daily.   diltiazem 180 MG 24 hr capsule Commonly known as:  TAZTIA XT Take 1 capsule (180 mg total) by mouth daily.    furosemide 40 MG tablet Commonly known as:  LASIX Take 1 tablet (40 mg total) by mouth daily.   IRON PO Take 65 mg by mouth daily.   MAGNESIUM PO Take 1 tablet by mouth daily.   nitroGLYCERIN 0.4 MG SL tablet Commonly known as:  NITROSTAT Place 0.4 mg under the tongue every 5 (five) minutes as needed for chest pain.   potassium chloride 10 MEQ tablet Commonly known as:  K-DUR,KLOR-CON Take 1 tablet (10 mEq total) by mouth daily.   pravastatin 20 MG tablet Commonly known as:  PRAVACHOL Take 1 tablet (20 mg total) by mouth daily at 6 PM. Start taking on:  April 19, 2018   telmisartan 80 MG tablet Commonly known as:  MICARDIS Take 1/2 to 1 tablet every morning for BP & Heart What changed:    how much to take  how to take this  when to take this  additional instructions   VITAMIN B 12 PO Take 1 tablet by mouth daily.   Vitamin D3 75 MCG (3000 UT) Tabs Take 3,000 tablets by mouth daily.   warfarin 3 MG tablet Commonly known as:  COUMADIN Take as directed. If you are unsure how to take this medication, talk to your nurse or doctor. Original instructions:  TAKE 1 TO 1 AND 1/2 TABLET BY MOUTH TABLETS AS DIRECTED What changed:  See the new instructions.   ZINC PO Take 1 tablet by mouth daily.      Follow-up Information    [name], MD. Schedule an appointment as soon as possible for a visit in 1 week(s).   Specialty:  Internal Medicine Contact information: [address] [place] [address] [phone]          Allergies  Allergen Reactions  . Latex Itching  . Ace Inhibitors Other (See Comments)    Unknown reaction  . Acrylic Polymer [Carbomer] Other (See Comments)  . Augmentin [Amoxicillin-Pot Clavulanate] Other (See Comments)  . Ciprofloxacin Other (See Comments)  . Levaquin [Levofloxacin In D5w] Other (See Comments)  . Zocor [Simvastatin] Other (See Comments)  . Chocolate Other (See Comments)    migraine's   .  Gabapentin Other (See Comments)    Unsteady gait     Consultations:  Cardiology Dr. Nelida Meuse room  Neurology Dr. Mechele Claude   Procedures/Studies: Ct Angio Head W Or Wo Contrast  Result Date: 04/17/2018 CLINICAL DATA:  Transient ischemic attack. History of atrial fibrillation, hypertension. EXAM: CT ANGIOGRAPHY HEAD AND NECK TECHNIQUE: Multidetector CT imaging of the head and neck was performed using the standard protocol during bolus administration of intravenous contrast. Multiplanar CT image reconstructions and MIPs were obtained to evaluate the vascular anatomy. Carotid stenosis measurements (when applicable) are obtained utilizing NASCET criteria, using the distal internal carotid diameter as the denominator. CONTRAST:  176mL ISOVUE-370 IOPAMIDOL (ISOVUE-370) INJECTION 76% COMPARISON:  CT Head April 17, 2018 and MRA head May 12, 2004. FINDINGS: CTA NECK FINDINGS: AORTIC  ARCH: Normal appearance of the thoracic arch, normal branch pattern. Moderate intimal thickening calcific atherosclerosis. The origins of the innominate, left Common carotid artery and subclavian artery are widely patent. RIGHT CAROTID SYSTEM: Common carotid artery is patent. Normal appearance of the carotid bifurcation without hemodynamically significant stenosis by NASCET criteria. Normal appearance of the internal carotid artery. LEFT CAROTID SYSTEM: Common carotid artery is patent. Normal appearance of the carotid bifurcation without hemodynamically significant stenosis by NASCET criteria. Normal appearance of the internal carotid artery. VERTEBRAL ARTERIES:Left vertebral artery arises directly from the aortic arch, normal variant. RIGHT vertebral artery is dominant. Patent vertebral arteries. SKELETON: No acute osseous process though bone windows have not been submitted. Severe C4-5 degenerative disc. Severe RIGHT C4-5 neural foraminal narrowing. OTHER NECK: Soft tissues of the neck are nonacute though, not tailored for  evaluation. RIGHT base of tongue cystic mass without suspicious features most consistent with mucosal retention cyst. UPPER CHEST: Enlarged main pulmonary artery seen with pulmonary arterial hypertension. Mosaic attenuation lung apices seen with small airway disease. Streak artifact from LEFT pacemaker. No superior mediastinal lymphadenopathy. CTA HEAD FINDINGS: ANTERIOR CIRCULATION: Patent cervical internal carotid arteries, petrous, cavernous and supra clinoid internal carotid arteries. Patent anterior communicating artery. Patent anterior and middle cerebral arteries. No large vessel occlusion, significant stenosis, contrast extravasation or aneurysm. POSTERIOR CIRCULATION: Patent vertebral arteries, vertebrobasilar junction and basilar artery, as well as main branch vessels. Patent posterior cerebral arteries. Small LEFT posterior communicating artery present. No large vessel occlusion, significant stenosis, contrast extravasation or aneurysm. VENOUS SINUSES: Major dural venous sinuses are patent though not tailored for evaluation on this angiographic examination. ANATOMIC VARIANTS: None. DELAYED PHASE: Mild enhancement LEFT paraclinoid meningioma. MIP images reviewed. IMPRESSION: CTA NECK: 1. No hemodynamically significant stenosis ICA's. 2. Patent vertebral artery. 3. Severe RIGHT C4-5 neural foraminal narrowing. CTA HEAD: 1. No emergent large vessel occlusion or flow-limiting stenosis. Aortic Atherosclerosis (ICD10-I70.0). Electronically Signed   By: Elon Alas M.D.   On: 04/17/2018 22:37   Ct Head Wo Contrast  Result Date: 04/18/2018 CLINICAL DATA:  TIA, re-evaluation EXAM: CT HEAD WITHOUT CONTRAST TECHNIQUE: Contiguous axial images were obtained from the base of the skull through the vertex without intravenous contrast. COMPARISON:  04/17/2018, 06/20/2009 FINDINGS: Brain: No evidence of acute infarction, hemorrhage, extra-axial collection, ventriculomegaly, or mass effect. Generalized cerebral  atrophy. Periventricular white matter low attenuation likely secondary to microangiopathy. Vascular: Cerebrovascular atherosclerotic calcifications are noted. Skull: Negative for fracture or focal lesion. Sinuses/Orbits: Visualized portions of the orbits are unremarkable. Visualized portions of the paranasal sinuses and mastoid air cells are unremarkable. Other: None. IMPRESSION: 1. No acute intracranial pathology. 2. Chronic microvascular disease and cerebral atrophy. Electronically Signed   By: Kathreen Devoid   On: 04/18/2018 16:17   Ct Head Wo Contrast  Result Date: 04/17/2018 CLINICAL DATA:  84 year old female with progressively worsening weakness and difficulty speaking. Symptoms worsened yesterday. Initial encounter. EXAM: CT HEAD WITHOUT CONTRAST TECHNIQUE: Contiguous axial images were obtained from the base of the skull through the vertex without intravenous contrast. COMPARISON:  06/20/2009. FINDINGS: Brain: No intracranial hemorrhage or CT evidence of large acute infarct. Chronic microvascular changes. Global atrophy. Anterior left middle cranial fossa/clinoid 1.2 cm calcified meningioma without surrounding vasogenic edema. Vascular: Vascular calcifications.  No acute hyperdense vessel. Skull: Hyperostosis frontalis interna. Sinuses/Orbits: No acute orbital abnormality. Post lens replacement. Visualized paranasal sinuses are clear. Other: Mastoid air cells and middle ear cavities are clear. IMPRESSION: 1. No intracranial hemorrhage or CT evidence of large acute infarct. 2.  Chronic microvascular changes. 3. Global atrophy. 4. Anterior left middle cranial fossa/clinoid 1.2 cm calcified meningioma without surrounding vasogenic edema. Electronically Signed   By: Genia Del M.D.   On: 04/17/2018 13:37   Ct Angio Neck W Or Wo Contrast  Result Date: 04/17/2018 CLINICAL DATA:  Transient ischemic attack. History of atrial fibrillation, hypertension. EXAM: CT ANGIOGRAPHY HEAD AND NECK TECHNIQUE:  Multidetector CT imaging of the head and neck was performed using the standard protocol during bolus administration of intravenous contrast. Multiplanar CT image reconstructions and MIPs were obtained to evaluate the vascular anatomy. Carotid stenosis measurements (when applicable) are obtained utilizing NASCET criteria, using the distal internal carotid diameter as the denominator. CONTRAST:  120mL ISOVUE-370 IOPAMIDOL (ISOVUE-370) INJECTION 76% COMPARISON:  CT Head April 17, 2018 and MRA head May 12, 2004. FINDINGS: CTA NECK FINDINGS: AORTIC ARCH: Normal appearance of the thoracic arch, normal branch pattern. Moderate intimal thickening calcific atherosclerosis. The origins of the innominate, left Common carotid artery and subclavian artery are widely patent. RIGHT CAROTID SYSTEM: Common carotid artery is patent. Normal appearance of the carotid bifurcation without hemodynamically significant stenosis by NASCET criteria. Normal appearance of the internal carotid artery. LEFT CAROTID SYSTEM: Common carotid artery is patent. Normal appearance of the carotid bifurcation without hemodynamically significant stenosis by NASCET criteria. Normal appearance of the internal carotid artery. VERTEBRAL ARTERIES:Left vertebral artery arises directly from the aortic arch, normal variant. RIGHT vertebral artery is dominant. Patent vertebral arteries. SKELETON: No acute osseous process though bone windows have not been submitted. Severe C4-5 degenerative disc. Severe RIGHT C4-5 neural foraminal narrowing. OTHER NECK: Soft tissues of the neck are nonacute though, not tailored for evaluation. RIGHT base of tongue cystic mass without suspicious features most consistent with mucosal retention cyst. UPPER CHEST: Enlarged main pulmonary artery seen with pulmonary arterial hypertension. Mosaic attenuation lung apices seen with small airway disease. Streak artifact from LEFT pacemaker. No superior mediastinal lymphadenopathy. CTA  HEAD FINDINGS: ANTERIOR CIRCULATION: Patent cervical internal carotid arteries, petrous, cavernous and supra clinoid internal carotid arteries. Patent anterior communicating artery. Patent anterior and middle cerebral arteries. No large vessel occlusion, significant stenosis, contrast extravasation or aneurysm. POSTERIOR CIRCULATION: Patent vertebral arteries, vertebrobasilar junction and basilar artery, as well as main branch vessels. Patent posterior cerebral arteries. Small LEFT posterior communicating artery present. No large vessel occlusion, significant stenosis, contrast extravasation or aneurysm. VENOUS SINUSES: Major dural venous sinuses are patent though not tailored for evaluation on this angiographic examination. ANATOMIC VARIANTS: None. DELAYED PHASE: Mild enhancement LEFT paraclinoid meningioma. MIP images reviewed. IMPRESSION: CTA NECK: 1. No hemodynamically significant stenosis ICA's. 2. Patent vertebral artery. 3. Severe RIGHT C4-5 neural foraminal narrowing. CTA HEAD: 1. No emergent large vessel occlusion or flow-limiting stenosis. Aortic Atherosclerosis (ICD10-I70.0). Electronically Signed   By: Elon Alas M.D.   On: 04/17/2018 22:37      Echocardiogram: Procedure narrative: Transthoracic echocardiography. Image   quality was adequate. The study was technically difficult. - Left ventricle: The cavity size was normal. Systolic function was   vigorous. The estimated ejection fraction was in the range of 65%   to 70%. Wall motion was normal; there were no regional wall   motion abnormalities. The study was not technically sufficient to   allow evaluation of LV diastolic dysfunction due to atrial   fibrillation. - Aortic valve: There was mild regurgitation. - Mitral valve: There was trivial regurgitation. - Right ventricle: The cavity size was normal. Wall thickness was   normal. Systolic function was normal. - Right  atrium: Pacer wire or catheter noted in right atrium. -  Tricuspid valve: There was mild regurgitation. - Pulmonary arteries: Systolic pressure was moderately increased.   PA peak pressure: 48 mm Hg (S). - Inferior vena cava: The vessel was normal in size. - Pericardium, extracardiac: There was no pericardial effusion.    Subjective: Back to normal.  Much stress at home.  Stable for discharge home.  Will take aspirin until INR back to anticoagulant range  Discharge Exam: Vitals:   04/18/18 1025 04/18/18 1420  BP: (!) 149/97 (!) 151/75  Pulse:  71  Resp: 20 16  Temp: 97.9 F (36.6 C) 98 F (36.7 C)  SpO2: 97% 96%   Vitals:   04/17/18 2331 04/18/18 0355 04/18/18 1025 04/18/18 1420  BP: (!) 159/73 (!) 136/59 (!) 149/97 (!) 151/75  Pulse: 64 63  71  Resp:  15 20 16   Temp: 98.1 F (36.7 C) 97.7 F (36.5 C) 97.9 F (36.6 C) 98 F (36.7 C)  TempSrc: Oral Oral Oral Oral  SpO2: 97% 96% 97% 96%    General: Pt is alert, awake, not in acute distress Cardiovascular: RRR, S1/S2 +, no rubs, no gallops Respiratory: CTA bilaterally, no wheezing, no rhonchi Abdominal: Soft, NT, ND, bowel sounds + Extremities: no edema, no cyanosis    The results of significant diagnostics from this hospitalization (including imaging, microbiology, ancillary and laboratory) are listed below for reference.     Microbiology: No results found for this or any previous visit (from the past 240 hour(s)).   Labs: BNP (last 3 results) No results for input(s): BNP in the last 8760 hours. Basic Metabolic Panel: Recent Labs  Lab 04/17/18 1227 04/17/18 1235  NA 142 140  K 4.1 4.0  CL 104 104  CO2 28  --   GLUCOSE 92 89  BUN 16 18  CREATININE 1.03* 0.90  CALCIUM 9.1  --    Liver Function Tests: Recent Labs  Lab 04/17/18 1227  AST 22  ALT 18  ALKPHOS 90  BILITOT 0.9  PROT 7.4  ALBUMIN 3.8   No results for input(s): LIPASE, AMYLASE in the last 168 hours. No results for input(s): AMMONIA in the last 168 hours. CBC: Recent Labs  Lab  04/17/18 1227 04/17/18 1235 04/18/18 0530  WBC 9.0  --  6.7  NEUTROABS 5.3  --   --   HGB 13.8 14.6 12.8  HCT 43.6 43.0 40.3  MCV 94.6  --  94.4  PLT 206  --  184   Cardiac Enzymes: No results for input(s): CKTOTAL, CKMB, CKMBINDEX, TROPONINI in the last 168 hours. BNP: Invalid input(s): POCBNP CBG: Recent Labs  Lab 04/17/18 1218  GLUCAP 93   D-Dimer No results for input(s): DDIMER in the last 72 hours. Hgb A1c No results for input(s): HGBA1C in the last 72 hours. Lipid Profile No results for input(s): CHOL, HDL, LDLCALC, TRIG, CHOLHDL, LDLDIRECT in the last 72 hours. Thyroid function studies No results for input(s): TSH, T4TOTAL, T3FREE, THYROIDAB in the last 72 hours.  Invalid input(s): FREET3 Anemia work up No results for input(s): VITAMINB12, FOLATE, FERRITIN, TIBC, IRON, RETICCTPCT in the last 72 hours. Urinalysis    Component Value Date/Time   COLORURINE DARK YELLOW 02/22/2018 1058   APPEARANCEUR TURBID (A) 02/22/2018 1058   LABSPEC 1.023 02/22/2018 1058   PHURINE 7.5 02/22/2018 1058   GLUCOSEU NEGATIVE 02/22/2018 1058   HGBUR NEGATIVE 02/22/2018 [place] 12/26/2015 1036   [place] 02/22/2018 1058  PROTEINUR TRACE (A) 02/22/2018 1058   UROBILINOGEN 1.0 08/24/2013 1509   NITRITE NEGATIVE 02/22/2018 1058   LEUKOCYTESUR TRACE (A) 02/22/2018 1058   Sepsis Labs Invalid input(s): PROCALCITONIN,  WBC,  LACTICIDVEN Microbiology No results found for this or any previous visit (from the past 240 hour(s)).   Time coordinating discharge: 45 minutes  SIGNED:   Lady Deutscher, MD  FACP [facility] 04/18/2018, 6:11 PM Pager   If 7PM-7AM, please contact night-coverage [email or web address] Password [email or web address]

## 2018-04-18 NOTE — Evaluation (Signed)
Occupational Therapy Evaluation Patient Details Name: Misty Blackwell MRN: [id number] DOB: [birth year] Today's Date: 04/18/2018    History of Present Illness Pt is an 84 y/o female with a PMH significant for pacemaker placement, CAD, HTN, CHF, a-fib on Coumadin and presumed history of TIA. She presents with slurred speech and L-side numbness. CT negative for acute infarct and unable to complete MRI due to pacemaker.   Clinical Impression   PTA, pt was living alone and was independent. Currently, pt performing ADLs and functional mobility at supervision level. Pt presenting near baseline function and denies any numbness/weakness at left side. Answered all pt questions. Recommend dc home once medically stable per physician. All acute OT needs met and will sign off. Thank you.     Follow Up Recommendations  No OT follow up;Supervision - Intermittent    Equipment Recommendations  None recommended by OT    Recommendations for Other Services PT consult     Precautions / Restrictions Precautions Precautions: Fall Restrictions Weight Bearing Restrictions: No      Mobility Bed Mobility Overal bed mobility: Needs Assistance Bed Mobility: Supine to Sit     Supine to sit: Supervision     General bed mobility comments: Increased time and supervision provided for safety.   Transfers Overall transfer level: Needs assistance Equipment used: None Transfers: Sit to/from Stand Sit to Stand: Supervision         General transfer comment: Supervision for safety as pt powered up to full stand. No overt LOB noted.     Balance Overall balance assessment: Needs assistance Sitting-balance support: Feet supported;No upper extremity supported Sitting balance-Leahy Scale: Fair     Standing balance support: No upper extremity supported;During functional activity Standing balance-Leahy Scale: Fair                             ADL either performed or assessed with clinical  judgement   ADL Overall ADL's : Needs assistance/impaired                                       General ADL Comments: Pt performing ADLs and functional mobility at supervision level near baseline function. Pt reporting she is feeling better today. No LOB and denies dizziness.      Vision Baseline Vision/History: Wears glasses Wears Glasses: Reading only Patient Visual Report: No change from baseline       Perception     Praxis      Pertinent Vitals/Pain Pain Assessment: No/denies pain     Hand Dominance Right   Extremity/Trunk Assessment Upper Extremity Assessment Upper Extremity Assessment: Overall WFL for tasks assessed   Lower Extremity Assessment Lower Extremity Assessment: Defer to PT evaluation   Cervical / Trunk Assessment Cervical / Trunk Assessment: Normal   Communication Communication Communication: No difficulties   Cognition Arousal/Alertness: Awake/alert Behavior During Therapy: WFL for tasks assessed/performed Overall Cognitive Status: Within Functional Limits for tasks assessed                                     General Comments       Exercises     Shoulder Instructions      Home Living Family/patient expects to be discharged to:: Private residence Living Arrangements: Alone Available Help at Discharge: Family;Neighbor;Available PRN/intermittently  Type of Home: House(Townhouse) Home Access: Stairs to enter [employer] of Steps: 3 Entrance Stairs-Rails: Right;Left;Can reach both Home Layout: One level     Bathroom Shower/Tub: Occupational psychologist: Standard     Home Equipment: Grab bars - tub/shower   Additional Comments: Reports she has a Higher education careers adviser who comes and checks BP, etc      Prior Functioning/Environment Level of Independence: Independent        Comments: ADLs, IADLs, and driving        OT Problem List: Decreased activity tolerance;Decreased knowledge of  precautions;Decreased knowledge of use of DME or AE;Impaired balance (sitting and/or standing)      OT Treatment/Interventions:      OT Goals(Current goals can be found in the care plan section) Acute Rehab OT Goals Patient Stated Goal: Return to her home alone OT Goal Formulation: All assessment and education complete, DC therapy  OT Frequency:     Barriers to D/C:            Co-evaluation PT/OT/SLP Co-Evaluation/Treatment: Yes Reason for Co-Treatment: To address functional/ADL transfers;Other (comment)(Limited availability of pt 2 testing and staff in room)   OT goals addressed during session: ADL's and self-care      AM-PAC OT "6 Clicks" Daily Activity     Outcome Measure Help from another person eating meals?: None Help from another person taking care of personal grooming?: None Help from another person toileting, which includes using toliet, bedpan, or urinal?: None Help from another person bathing (including washing, rinsing, drying)?: None Help from another person to put on and taking off regular upper body clothing?: None Help from another person to put on and taking off regular lower body clothing?: None 6 Click Score: 24   End of Session Equipment Utilized During Treatment: Gait belt Nurse Communication: Mobility status  Activity Tolerance: Patient tolerated treatment well Patient left: in chair;with call bell/phone within reach(with MD)  OT Visit Diagnosis: Other abnormalities of gait and mobility (R26.89);Muscle weakness (generalized) (M62.81)                Time: 6580-0634 OT Time Calculation (min): 13 min Charges:  OT General Charges $OT Visit: 1 Visit OT Evaluation $OT Eval Low Complexity: [place], OTR/L Acute Rehab Pager: [phone] Office: [place] 04/18/2018, 12:12 PM

## 2018-04-19 ENCOUNTER — Telehealth: Payer: Self-pay

## 2018-04-19 ENCOUNTER — Ambulatory Visit (INDEPENDENT_AMBULATORY_CARE_PROVIDER_SITE_OTHER): Payer: Medicare Other

## 2018-04-19 DIAGNOSIS — I482 Chronic atrial fibrillation, unspecified: Secondary | ICD-10-CM | POA: Diagnosis not present

## 2018-04-19 NOTE — Telephone Encounter (Signed)
Spoke with pt and reminded pt of remote transmission that is due today. Pt verbalized understanding.  Pt would like for Dr. Sallyanne Kuster to call her back because the hospital told her to start back taking aspirin but she do not want to do anything without Dr. Sallyanne Kuster permission. Pt is asking to get a phone call today. The best phone number for the pt  Is [phone]. If she do not answer leave a message. Let her know if she needs to come in for an appointment or not.

## 2018-04-20 ENCOUNTER — Ambulatory Visit (INDEPENDENT_AMBULATORY_CARE_PROVIDER_SITE_OTHER): Payer: Medicare Other | Admitting: Pharmacist

## 2018-04-20 ENCOUNTER — Encounter: Payer: Self-pay | Admitting: Cardiology

## 2018-04-20 DIAGNOSIS — Z7901 Long term (current) use of anticoagulants: Secondary | ICD-10-CM | POA: Diagnosis not present

## 2018-04-20 DIAGNOSIS — I482 Chronic atrial fibrillation, unspecified: Secondary | ICD-10-CM | POA: Diagnosis not present

## 2018-04-20 LAB — POCT INR: INR: 2 (ref 2.0–3.0)

## 2018-04-20 NOTE — Telephone Encounter (Signed)
Tried to call but only got a nonspecific voicemail. I looked through the notes.  There was no intention for her to take both aspirin and warfarin, simply to take aspirin until her warfarin reached therapeutic level.  If her INR is above 2.0 she does not need to continue aspirin. An appointment is not necessary.  Keep previously scheduled appointment. MCr

## 2018-04-20 NOTE — Progress Notes (Signed)
Remote pacemaker transmission.   

## 2018-04-21 NOTE — Progress Notes (Signed)
Hospital follow up  Assessment and Plan: Hospital visit follow up for: aphasia/?TIA, stress/anxiety  Misty Blackwell was seen today for follow-up.  Diagnoses and all orders for this visit:  Aphasia/TIA (transient ischemic attack) Symptoms resolving, start tolerating new statin well No further neurology follow up was recommended; continue to monitor at our office for now. Keep close follow up in 1 month.   Essential hypertension Continue medication Monitor blood pressure at home; call if consistently over 130/80 Continue DASH diet.   Reminder to go to the ER if any CP, SOB, nausea, dizziness, severe HA, changes vision/speech, left arm numbness and tingling and jaw pain.  Chronic atrial fibrillation Continue coumadin follow up a a. Fib clinic No concerns with excess bleeding ASA has been d/c'd  Anxiety Increased stress r/t sister's dementia; unlikely to improve Discussed stress management, trial of low dose SSRI and will keep close follow up Continue trazodone at night for sleep, keep low dose xanax PRN during the day until mood stable with SSRI Follow up as scheduled in 1 month -     escitalopram (LEXAPRO) 5 MG tablet; Take 1 tablet (5 mg total) by mouth daily.  Hyperlipidemia Continue statin for LDL goal <70 Continue low cholesterol diet and exercise.  Check lipid panel at follow up  Medication management -     CBC with Differential/Platelet -     BASIC METABOLIC PANEL WITH GFR  Unsteady gait Patient perceives overall decline in strength, increasing subjective instability on her feet  Discussed PT referral which she strongly prefers to proceed with today -     Ambulatory referral to Physical Therapy  All medications were reviewed with patient and family and fully reconciled. All questions answered fully, and patient and family members were encouraged to call the office with any further questions or concerns. Discussed goal to avoid readmission related to this diagnosis.    Medications Discontinued During This Encounter  Medication Reason  . aspirin 325 MG tablet Completed Course    Over 40 minutes of exam, counseling, chart review, and complex, high/moderate level critical decision making was performed this visit.   Future Appointments  Date Time Provider Misty Blackwell  05/18/2018  9:30 AM [facility] [place]  05/29/2018 10:45 AM Misty Comber, NP [facility] None  07/19/2018  7:35 AM [facility]OT[facility]  08/30/2018 10:30 AM Misty Pinto, MD [facility] None  03/12/2019 10:00 AM Misty Pinto, MD [facility] None     HPI 84 y.o.female presents for follow up for transition from recent hospitalization or [facility] stay. Admit date to the hospital was 04/17/18, patient was discharged from the hospital on 04/18/18 and our clinical staff contacted the office the day after discharge to set up a follow up appointment. The discharge summary, medications, and diagnostic test results were reviewed before meeting with the patient. The patient was admitted for: TIA, aphasia   Misty Blackwell is a 84 y.o. female with medical history significant of TIA; pacemaker placement; CAD; HTN; CHF; and afib on Coumadin presented to ED after recommended by Dr. Melford Aase d/t c/o with weakness that began several days prior. ED Course:  Old stroke, not in the window.  Difficulty articulating, expressive aphasia, mild dysphagia, balance disturbance.  Mild aphasia but fairly clear and without other deficits.  Head CT negative. Pertinent labs: BUN 16/Creatinine 1.03/GFR 48,Troponin 0.00, Normal CBC, INR 2.27, ETOH <10. She was admitted for observation and neurology was consulted. Dr. Erlinda Hong with neurology felt that her symptoms of generalized weakness, intermittent slurry  speech, gait imbalance were mostly resolved, and most attributable to increased stress at home related to her elderly sister's advancing dementia. CTA, 2D Echo EF 65-70%.  He recommended to continue with coumadin anticoag via cardiology, ASA 325 mg until INR back to therapeutic range, and recommended statin therapy for LDL goal of <70.   She patient presents today reporting she is significantly improved, reports some subjective word hesitancy, some reduced mental clarity that is still persisting, though in office she appears at her usual baseline to examiner. She discussed this with neurology and was told to expect 2 weeks for full resolution of symptoms. She is concerned about generalized weakenss and feeling less steady on her feet for several weeks/months leading up to this hospitalization. She was evaluated by PT/OT while admitted who did not feel therapy was strongly indicated at that time.   She did take ASA to bridge until INR therapeutic, she has stopped since following up with cardiology, lipitor 20 mg daily was initiated per neurology for and she has been taking daily without myalgias or other concerns for SE thus far.   In light of suspected stress contributing to symptoms, we discussed her stress/anxiety; she reports stress r/t her sister's advancing dementia, paranoia, confusion. She is currently prescribed xanax for sleep at night, recently transitioned to trazodone and doing well with this for sleep. She is not currently on a daily agent for mood/anxiety. Discussed low dose lexapro.   Her blood pressure has been controlled at home, today their BP is BP: 132/80 She denies chest pain, shortness of breath, dizziness.  The cholesterol last visit was:   Lab Results  Component Value Date   CHOL 167 02/22/2018   HDL 41 (L) 02/22/2018   LDLCALC 105 (H) 02/22/2018   TRIG 118 02/22/2018   CHOLHDL 4.1 02/22/2018    Coumadin managed via cardiology and INR at goal:  Lab Results  Component Value Date   INR 2.0 04/20/2018   INR 1.84 04/18/2018   INR 2.27 04/17/2018     Home health is not involved.   Images while in the hospital: Ct Angio Head W Or Wo  Contrast  Result Date: 04/17/2018 CLINICAL DATA:  Transient ischemic attack. History of atrial fibrillation, hypertension. EXAM: CT ANGIOGRAPHY HEAD AND NECK TECHNIQUE: Multidetector CT imaging of the head and neck was performed using the standard protocol during bolus administration of intravenous contrast. Multiplanar CT image reconstructions and MIPs were obtained to evaluate the vascular anatomy. Carotid stenosis measurements (when applicable) are obtained utilizing NASCET criteria, using the distal internal carotid diameter as the denominator. CONTRAST:  114mL ISOVUE-370 IOPAMIDOL (ISOVUE-370) INJECTION 76% COMPARISON:  CT Head April 17, 2018 and MRA head May 12, 2004. FINDINGS: CTA NECK FINDINGS: AORTIC ARCH: Normal appearance of the thoracic arch, normal branch pattern. Moderate intimal thickening calcific atherosclerosis. The origins of the innominate, left Common carotid artery and subclavian artery are widely patent. RIGHT CAROTID SYSTEM: Common carotid artery is patent. Normal appearance of the carotid bifurcation without hemodynamically significant stenosis by NASCET criteria. Normal appearance of the internal carotid artery. LEFT CAROTID SYSTEM: Common carotid artery is patent. Normal appearance of the carotid bifurcation without hemodynamically significant stenosis by NASCET criteria. Normal appearance of the internal carotid artery. VERTEBRAL ARTERIES:Left vertebral artery arises directly from the aortic arch, normal variant. RIGHT vertebral artery is dominant. Patent vertebral arteries. SKELETON: No acute osseous process though bone windows have not been submitted. Severe C4-5 degenerative disc. Severe RIGHT C4-5 neural foraminal narrowing. OTHER NECK: Soft tissues  of the neck are nonacute though, not tailored for evaluation. RIGHT base of tongue cystic mass without suspicious features most consistent with mucosal retention cyst. UPPER CHEST: Enlarged main pulmonary artery seen with  pulmonary arterial hypertension. Mosaic attenuation lung apices seen with small airway disease. Streak artifact from LEFT pacemaker. No superior mediastinal lymphadenopathy. CTA HEAD FINDINGS: ANTERIOR CIRCULATION: Patent cervical internal carotid arteries, petrous, cavernous and supra clinoid internal carotid arteries. Patent anterior communicating artery. Patent anterior and middle cerebral arteries. No large vessel occlusion, significant stenosis, contrast extravasation or aneurysm. POSTERIOR CIRCULATION: Patent vertebral arteries, vertebrobasilar junction and basilar artery, as well as main branch vessels. Patent posterior cerebral arteries. Small LEFT posterior communicating artery present. No large vessel occlusion, significant stenosis, contrast extravasation or aneurysm. VENOUS SINUSES: Major dural venous sinuses are patent though not tailored for evaluation on this angiographic examination. ANATOMIC VARIANTS: None. DELAYED PHASE: Mild enhancement LEFT paraclinoid meningioma. MIP images reviewed. IMPRESSION: CTA NECK: 1. No hemodynamically significant stenosis ICA's. 2. Patent vertebral artery. 3. Severe RIGHT C4-5 neural foraminal narrowing. CTA HEAD: 1. No emergent large vessel occlusion or flow-limiting stenosis. Aortic Atherosclerosis (ICD10-I70.0). Electronically Signed   By: Elon Alas M.D.   On: 04/17/2018 22:37   Ct Head Wo Contrast  Result Date: 04/18/2018 CLINICAL DATA:  TIA, re-evaluation EXAM: CT HEAD WITHOUT CONTRAST TECHNIQUE: Contiguous axial images were obtained from the base of the skull through the vertex without intravenous contrast. COMPARISON:  04/17/2018, 06/20/2009 FINDINGS: Brain: No evidence of acute infarction, hemorrhage, extra-axial collection, ventriculomegaly, or mass effect. Generalized cerebral atrophy. Periventricular white matter low attenuation likely secondary to microangiopathy. Vascular: Cerebrovascular atherosclerotic calcifications are noted. Skull: Negative  for fracture or focal lesion. Sinuses/Orbits: Visualized portions of the orbits are unremarkable. Visualized portions of the paranasal sinuses and mastoid air cells are unremarkable. Other: None. IMPRESSION: 1. No acute intracranial pathology. 2. Chronic microvascular disease and cerebral atrophy. Electronically Signed   By: Kathreen Devoid   On: 04/18/2018 16:17   Ct Head Wo Contrast  Result Date: 04/17/2018 CLINICAL DATA:  84 year old female with progressively worsening weakness and difficulty speaking. Symptoms worsened yesterday. Initial encounter. EXAM: CT HEAD WITHOUT CONTRAST TECHNIQUE: Contiguous axial images were obtained from the base of the skull through the vertex without intravenous contrast. COMPARISON:  06/20/2009. FINDINGS: Brain: No intracranial hemorrhage or CT evidence of large acute infarct. Chronic microvascular changes. Global atrophy. Anterior left middle cranial fossa/clinoid 1.2 cm calcified meningioma without surrounding vasogenic edema. Vascular: Vascular calcifications.  No acute hyperdense vessel. Skull: Hyperostosis frontalis interna. Sinuses/Orbits: No acute orbital abnormality. Post lens replacement. Visualized paranasal sinuses are clear. Other: Mastoid air cells and middle ear cavities are clear. IMPRESSION: 1. No intracranial hemorrhage or CT evidence of large acute infarct. 2. Chronic microvascular changes. 3. Global atrophy. 4. Anterior left middle cranial fossa/clinoid 1.2 cm calcified meningioma without surrounding vasogenic edema. Electronically Signed   By: Genia Del M.D.   On: 04/17/2018 13:37   Ct Angio Neck W Or Wo Contrast  Result Date: 04/17/2018 CLINICAL DATA:  Transient ischemic attack. History of atrial fibrillation, hypertension. EXAM: CT ANGIOGRAPHY HEAD AND NECK TECHNIQUE: Multidetector CT imaging of the head and neck was performed using the standard protocol during bolus administration of intravenous contrast. Multiplanar CT image reconstructions and  MIPs were obtained to evaluate the vascular anatomy. Carotid stenosis measurements (when applicable) are obtained utilizing NASCET criteria, using the distal internal carotid diameter as the denominator. CONTRAST:  167mL ISOVUE-370 IOPAMIDOL (ISOVUE-370) INJECTION 76% COMPARISON:  CT Head April 17, 2018 and MRA head May 12, 2004. FINDINGS: CTA NECK FINDINGS: AORTIC ARCH: Normal appearance of the thoracic arch, normal branch pattern. Moderate intimal thickening calcific atherosclerosis. The origins of the innominate, left Common carotid artery and subclavian artery are widely patent. RIGHT CAROTID SYSTEM: Common carotid artery is patent. Normal appearance of the carotid bifurcation without hemodynamically significant stenosis by NASCET criteria. Normal appearance of the internal carotid artery. LEFT CAROTID SYSTEM: Common carotid artery is patent. Normal appearance of the carotid bifurcation without hemodynamically significant stenosis by NASCET criteria. Normal appearance of the internal carotid artery. VERTEBRAL ARTERIES:Left vertebral artery arises directly from the aortic arch, normal variant. RIGHT vertebral artery is dominant. Patent vertebral arteries. SKELETON: No acute osseous process though bone windows have not been submitted. Severe C4-5 degenerative disc. Severe RIGHT C4-5 neural foraminal narrowing. OTHER NECK: Soft tissues of the neck are nonacute though, not tailored for evaluation. RIGHT base of tongue cystic mass without suspicious features most consistent with mucosal retention cyst. UPPER CHEST: Enlarged main pulmonary artery seen with pulmonary arterial hypertension. Mosaic attenuation lung apices seen with small airway disease. Streak artifact from LEFT pacemaker. No superior mediastinal lymphadenopathy. CTA HEAD FINDINGS: ANTERIOR CIRCULATION: Patent cervical internal carotid arteries, petrous, cavernous and supra clinoid internal carotid arteries. Patent anterior communicating artery.  Patent anterior and middle cerebral arteries. No large vessel occlusion, significant stenosis, contrast extravasation or aneurysm. POSTERIOR CIRCULATION: Patent vertebral arteries, vertebrobasilar junction and basilar artery, as well as main branch vessels. Patent posterior cerebral arteries. Small LEFT posterior communicating artery present. No large vessel occlusion, significant stenosis, contrast extravasation or aneurysm. VENOUS SINUSES: Major dural venous sinuses are patent though not tailored for evaluation on this angiographic examination. ANATOMIC VARIANTS: None. DELAYED PHASE: Mild enhancement LEFT paraclinoid meningioma. MIP images reviewed. IMPRESSION: CTA NECK: 1. No hemodynamically significant stenosis ICA's. 2. Patent vertebral artery. 3. Severe RIGHT C4-5 neural foraminal narrowing. CTA HEAD: 1. No emergent large vessel occlusion or flow-limiting stenosis. Aortic Atherosclerosis (ICD10-I70.0). Electronically Signed   By: Elon Alas M.D.   On: 04/17/2018 22:37      Current Outpatient Medications (Cardiovascular):  .  diltiazem (TIAZAC) 180 MG 24 hr capsule, Take 1 capsule (180 mg total) by mouth daily. .  furosemide (LASIX) 40 MG tablet, Take 1 tablet (40 mg total) by mouth daily. .  nitroGLYCERIN (NITROSTAT) 0.4 MG SL tablet, Place 0.4 mg under the tongue every 5 (five) minutes as needed for chest pain. .  pravastatin (PRAVACHOL) 20 MG tablet, Take 1 tablet (20 mg total) by mouth daily at 6 PM. .  telmisartan (MICARDIS) 80 MG tablet, Take 1/2 to 1 tablet every morning for BP & Heart (Patient taking differently: Take 40 mg by mouth daily. )    Current Outpatient Medications (Hematological):  Marland Kitchen  Cyanocobalamin (VITAMIN B 12 PO), Take 1 tablet by mouth daily. .  IRON PO, Take 65 mg by mouth daily.  Marland Kitchen  warfarin (COUMADIN) 3 MG tablet, TAKE 1 TO 1 AND 1/2 TABLET BY MOUTH TABLETS AS DIRECTED (Patient taking differently: Take 3-4.5 mg by mouth daily. Take 4.5mg  on Mon, Wed, Fri, and  Sat and 3mg  on Tues, Thurs, and Sun)  Current Outpatient Medications (Other):  Marland Kitchen  ALPRAZolam (XANAX) 1 MG tablet, Take 1/2-1 tablet 1 to 2 x /day ONLY if needed for Anxiety Attack &  Please try to limit to 5 days  /week to avoid addiction (Patient taking differently: 0.5 mg at bedtime as needed for anxiety. ) .  BIOTIN PO, Take 1  tablet by mouth daily. .  Cholecalciferol (VITAMIN D3) 3000 units TABS, Take 3,000 tablets by mouth daily.  Marland Kitchen  MAGNESIUM PO, Take 1 tablet by mouth daily. .  Multiple Vitamins-Minerals (ZINC PO), Take 1 tablet by mouth daily. .  potassium chloride SA (K-DUR,KLOR-CON) 10 MEQ tablet, Take 1 tablet (10 mEq total) by mouth daily. .  traZODone (DESYREL) 150 MG tablet, Take 0.5 tablets by mouth at bedtime. Marland Kitchen  escitalopram (LEXAPRO) 5 MG tablet, Take 1 tablet (5 mg total) by mouth daily.  Past Medical History:  Diagnosis Date  . Atrial fib/flutter, transient   . Atrial fibrillation, chronic   . CHF (congestive heart failure) ([place]) 10/29/2009   Echo - EF >55%; normal LV size and systolic function; unable to assess diastolic fcn due to E/A fusion, pulmonary vein flow pattern suggests elevated filling pressure; marked biatrail dilation, mild/mod tricuspid regurgitation; mod pulmonary htn; mild/mod mitral regurgitation; although echocardiographic features are incomplete findings suggest possible infiltrative cardiomyopathy (maybe amyloidosi  . Coronary artery disease 03/19/2002   R/P Cardiolite - EF 76%; nromal static and dynamic myocardial perfusion images; normal wall motion and endocardial thickening in all vascular territories  . Facial numbness 12/26/2008   carotid doppler - R and L ICAs 0-49% diameter reduction (velocities suggest low end of scale)  . Hypertension   . Pacemaker   . Peripheral neuropathy   . Skin cancer    s/p surgical removal.  . TIA (transient ischemic attack)      Allergies  Allergen Reactions  . Latex Itching  . Ace Inhibitors Other (See  Comments)    Unknown reaction  . Acrylic Polymer [Carbomer] Other (See Comments)  . Augmentin [Amoxicillin-Pot Clavulanate] Other (See Comments)  . Ciprofloxacin Other (See Comments)  . Levaquin [Levofloxacin In D5w] Other (See Comments)  . Zocor [Simvastatin] Other (See Comments)  . Chocolate Other (See Comments)    migraine's   . Gabapentin Other (See Comments)    Unsteady gait     ROS: all negative except above.   Physical Exam: Filed Weights   04/24/18 0900  Weight: 144 lb 8 oz (65.5 kg)   BP 132/80   Pulse 80   Temp (!) 97.5 F (36.4 C)   Wt 144 lb 8 oz (65.5 kg)   SpO2 98%   BMI 25.20 kg/m  General Appearance: Well nourished, in no apparent distress. Eyes: PERRLA, EOMs, conjunctiva no swelling or erythema Sinuses: No Frontal/maxillary tenderness ENT/Mouth: Ext aud canals clear, TMs without erythema, bulging. No erythema, swelling, or exudate on post pharynx.  Tonsils not swollen or erythematous. Hearing normal.  Neck: Supple, thyroid normal.  Respiratory: Respiratory effort normal, BS equal bilaterally without rales, rhonchi, wheezing or stridor.  Cardio: RRR with no MRGs. Brisk peripheral pulses without edema.  Abdomen: Soft, + BS.  Non tender, no guarding, rebound, hernias, masses. Lymphatics: Non tender without lymphadenopathy.  Musculoskeletal: Full ROM, 5/5 strength, normal gait.  Skin: Warm, dry without rashes, lesions, ecchymosis.  Neuro: Cranial nerves intact. Normal muscle tone, no cerebellar symptoms. Sensation intact. Speech is clear without apparent expressive difficulty.  Psych: Awake and oriented X 3, mildly anxious affect, Insight and Judgment appropriate.     Izora Ribas, NP 9:47 AM [facility]

## 2018-04-24 ENCOUNTER — Encounter: Payer: Self-pay | Admitting: Adult Health

## 2018-04-24 ENCOUNTER — Ambulatory Visit (INDEPENDENT_AMBULATORY_CARE_PROVIDER_SITE_OTHER): Payer: Medicare Other | Admitting: Adult Health

## 2018-04-24 VITALS — BP 132/80 | HR 80 | Temp 97.5°F | Wt 144.5 lb

## 2018-04-24 DIAGNOSIS — I10 Essential (primary) hypertension: Secondary | ICD-10-CM | POA: Diagnosis not present

## 2018-04-24 DIAGNOSIS — G459 Transient cerebral ischemic attack, unspecified: Secondary | ICD-10-CM | POA: Diagnosis not present

## 2018-04-24 DIAGNOSIS — E782 Mixed hyperlipidemia: Secondary | ICD-10-CM

## 2018-04-24 DIAGNOSIS — I482 Chronic atrial fibrillation, unspecified: Secondary | ICD-10-CM

## 2018-04-24 DIAGNOSIS — R4701 Aphasia: Secondary | ICD-10-CM | POA: Diagnosis not present

## 2018-04-24 DIAGNOSIS — Z79899 Other long term (current) drug therapy: Secondary | ICD-10-CM

## 2018-04-24 DIAGNOSIS — F419 Anxiety disorder, unspecified: Secondary | ICD-10-CM

## 2018-04-24 DIAGNOSIS — R2681 Unsteadiness on feet: Secondary | ICD-10-CM

## 2018-04-24 MED ORDER — ESCITALOPRAM OXALATE 5 MG PO TABS: 5.0000 mg | ORAL_TABLET | Freq: Every day | ORAL | 1 refills | Status: DC

## 2018-04-24 NOTE — Patient Instructions (Signed)
Stress Stress is a normal reaction to life events. Stress is what you feel when life demands more than you are used to, or more than you think you can handle. Some stress can be useful, such as studying for a test or meeting a deadline at work. Stress that occurs too often or for too long can cause problems. It can affect your emotional health and interfere with relationships and normal daily activities. Too much stress can weaken your body's defense system (immune system) and increase your risk for physical illness. If you already have a medical problem, stress can make it worse. What are the causes? All sorts of life events can cause stress. An event that causes stress for one person may not be stressful for another person. Major life events, whether positive or negative, commonly cause stress. Examples include:  Losing a job or starting a new job.  Losing a loved one.  Moving to a new town or home.  Getting married or divorced.  Having a baby.  Injury or illness. Less obvious life events can also cause stress, especially if they occur day after day or in combination with each other. Examples include:  Working long hours.  Driving in traffic.  Caring for children.  Being in debt.  Being in a difficult relationship. What are the signs or symptoms? Stress can cause emotional symptoms, including:  Anxiety. This is feeling worried, afraid, on edge, overwhelmed, or out of control.  Anger, including irritation or impatience.  Depression. This is feeling sad, down, helpless, or guilty.  Trouble focusing, remembering, or making decisions. Stress can cause physical symptoms, including:  Aches and pains. These may affect your head, neck, back, stomach, or other areas of your body.  Tight muscles or a clenched jaw.  Low energy.  Trouble sleeping. Stress can cause unhealthy behaviors, including:  Eating to feel better (overeating) or skipping meals.  Working too much or  putting off tasks.  Smoking, drinking alcohol, or using drugs to feel better. How is this diagnosed? Stress is diagnosed through an assessment by your health care provider. He or she may diagnose this condition based on:  Your symptoms and any stressful life events.  Your medical history.  Tests to rule out other causes of your symptoms. Depending on your condition, your health care provider may refer you to a specialist for further evaluation. How is this treated?  Stress management techniques are the recommended treatment for stress. Medicine is not typically recommended for the treatment of stress. Techniques to reduce your reaction to stressful life events include:  Stress identification. Monitor yourself for symptoms of stress and identify what causes stress for you. These skills may help you to avoid or prepare for stressful events.  Time management. Set your priorities, keep a calendar of events, and learn to say "no." Taking these actions can help you avoid making too many commitments. Techniques for coping with stress include:  Rethinking the problem. Try to think realistically about stressful events rather than ignoring them or overreacting. Try to find the positives in a stressful situation rather than focusing on the negatives.  Exercise. Physical exercise can release both physical and emotional tension. The key is to find a form of exercise that you enjoy and do it regularly.  Relaxation techniques. These relax the body and mind. The key is to find one or more that you enjoy and use the technique(s) regularly. Examples include: ? Meditation, deep breathing, or progressive relaxation techniques. ? Yoga or tai chi. ?  Biofeedback, mindfulness techniques, or journaling. ? Listening to music, being out in nature, or participating in other hobbies.  Practicing a healthy lifestyle. Eat a balanced diet, drink plenty of water, limit or avoid caffeine, and get plenty of  sleep.  Having a strong support network. Spend time with family, friends, or other people you enjoy being around. Express your feelings and talk things over with someone you trust. Counseling or talk therapy with a mental health professional may be helpful if you are having trouble managing stress on your own. Follow these instructions at home: Lifestyle   Avoid drugs.  Do not use any products that contain nicotine or tobacco, such as cigarettes and e-cigarettes. If you need help quitting, ask your health care provider.  Limit alcohol intake to no more than 1 drink a day for nonpregnant women and 2 drinks a day for men. One drink equals 12 oz of beer, 5 oz of wine, or 1 oz of hard liquor.  Do not use alcohol or drugs to relax.  Eat a balanced diet that includes fresh fruits and vegetables, whole grains, lean meats, fish, eggs, and beans, and low-fat dairy. Avoid processed foods and foods high in added fat, sugar, and salt.  Exercise at least 30 minutes on 5 or more days each week.  Get 7-8 hours of sleep each night. General instructions   Practice stress management techniques as discussed with your health care provider.  Drink enough fluid to keep your urine clear or pale yellow.  Take over-the-counter and prescription medicines only as told by your health care provider.  Keep all follow-up visits as told by your health care provider. This is important. Contact a health care provider if:  Your symptoms get worse.  You have new symptoms.  You feel overwhelmed by your problems and can no longer manage them on your own. Get help right away if:  You have thoughts of hurting yourself or others. If you ever feel like you may hurt yourself or others, or have thoughts about taking your own life, get help right away. You can go to your nearest emergency department or call:  Your local emergency services (911 in the [place]  A suicide crisis helpline, such as the [place] at (940)540-0845. This is open 24 hours a day. Summary  Stress is a normal reaction to life events. It can cause problems if it happens too often or for too long.  Practicing stress management techniques is the best way to treat stress.  Counseling or talk therapy with a mental health professional may be helpful if you are having trouble managing stress on your own. This information is not intended to replace advice given to you by your health care provider. Make sure you discuss any questions you have with your health care provider. Document Released: 10/13/2000 Document Revised: 06/09/2016 Document Reviewed: 06/09/2016 Elsevier Interactive Patient Education  2019 [employer].   Escitalopram tablets What is this medicine? ESCITALOPRAM (es sye TAL oh pram) is used to treat depression and certain types of anxiety. This medicine may be used for other purposes; ask your health care provider or pharmacist if you have questions. COMMON BRAND NAME(S): Lexapro What should I tell my health care provider before I take this medicine? They need to know if you have any of these conditions: -bipolar disorder or a family history of bipolar disorder -diabetes -glaucoma -heart disease -kidney or liver disease -receiving electroconvulsive therapy -seizures (convulsions) -suicidal thoughts, plans, or attempt by  you or a family member -an unusual or allergic reaction to escitalopram, the related drug citalopram, other medicines, foods, dyes, or preservatives -pregnant or trying to become pregnant -breast-feeding How should I use this medicine? Take this medicine by mouth with a glass of water. Follow the directions on the prescription label. You can take it with or without food. If it upsets your stomach, take it with food. Take your medicine at regular intervals. Do not take it more often than directed. Do not stop taking this medicine suddenly except upon the advice of your  doctor. Stopping this medicine too quickly may cause serious side effects or your condition may worsen. A special MedGuide will be given to you by the pharmacist with each prescription and refill. Be sure to read this information carefully each time. Talk to your pediatrician regarding the use of this medicine in children. Special care may be needed. Overdosage: If you think you have taken too much of this medicine contact a poison control center or emergency room at once. NOTE: This medicine is only for you. Do not share this medicine with others. What if I miss a dose? If you miss a dose, take it as soon as you can. If it is almost time for your next dose, take only that dose. Do not take double or extra doses. What may interact with this medicine? Do not take this medicine with any of the following medications: -certain medicines for fungal infections like fluconazole, itraconazole, ketoconazole, posaconazole, voriconazole -cisapride -citalopram -dofetilide -dronedarone -linezolid -MAOIs like Carbex, Eldepryl, Marplan, Nardil, and Parnate -methylene blue (injected into a vein) -pimozide -thioridazine -ziprasidone This medicine may also interact with the following medications: -alcohol -amphetamines -aspirin and aspirin-like medicines -carbamazepine -certain medicines for depression, anxiety, or psychotic disturbances -certain medicines for migraine headache like almotriptan, eletriptan, frovatriptan, naratriptan, rizatriptan, sumatriptan, zolmitriptan -certain medicines for sleep -certain medicines that treat or prevent blood clots like warfarin, enoxaparin, dalteparin -cimetidine -diuretics -fentanyl -furazolidone -isoniazid -lithium -metoprolol -NSAIDs, medicines for pain and inflammation, like ibuprofen or naproxen -other medicines that prolong the QT interval (cause an abnormal heart rhythm) -procarbazine -rasagiline -supplements like St. John's wort, kava kava,  valerian -tramadol -tryptophan This list may not describe all possible interactions. Give your health care provider a list of all the medicines, herbs, non-prescription drugs, or dietary supplements you use. Also tell them if you smoke, drink alcohol, or use illegal drugs. Some items may interact with your medicine. What should I watch for while using this medicine? Tell your doctor if your symptoms do not get better or if they get worse. Visit your doctor or health care professional for regular checks on your progress. Because it may take several weeks to see the full effects of this medicine, it is important to continue your treatment as prescribed by your doctor. Patients and their families should watch out for new or worsening thoughts of suicide or depression. Also watch out for sudden changes in feelings such as feeling anxious, agitated, panicky, irritable, hostile, aggressive, impulsive, severely restless, overly excited and hyperactive, or not being able to sleep. If this happens, especially at the beginning of treatment or after a change in dose, call your health care professional. Dennis Bast may get drowsy or dizzy. Do not drive, use machinery, or do anything that needs mental alertness until you know how this medicine affects you. Do not stand or sit up quickly, especially if you are an older patient. This reduces the risk of dizzy or fainting spells. Alcohol may  interfere with the effect of this medicine. Avoid alcoholic drinks. Your mouth may get dry. Chewing sugarless gum or sucking hard candy, and drinking plenty of water may help. Contact your doctor if the problem does not go away or is severe. What side effects may I notice from receiving this medicine? Side effects that you should report to your doctor or health care professional as soon as possible: -allergic reactions like skin rash, itching or hives, swelling of the face, lips, or tongue -anxious -black, tarry stools -changes in  vision -confusion -elevated mood, decreased need for sleep, racing thoughts, impulsive behavior -eye pain -fast, irregular heartbeat -feeling faint or lightheaded, falls -feeling agitated, angry, or irritable -hallucination, loss of contact with reality -loss of balance or coordination -loss of memory -painful or prolonged erections -restlessness, pacing, inability to keep still -seizures -stiff muscles -suicidal thoughts or other mood changes -trouble sleeping -unusual bleeding or bruising -unusually weak or tired -vomiting Side effects that usually do not require medical attention (report to your doctor or health care professional if they continue or are bothersome): -changes in appetite -change in sex drive or performance -headache -increased sweating -indigestion, nausea -tremors This list may not describe all possible side effects. Call your doctor for medical advice about side effects. You may report side effects to FDA at 1-800-FDA-1088. Where should I keep my medicine? Keep out of reach of children. Store at room temperature between 15 and 30 degrees C (59 and 86 degrees F). Throw away any unused medicine after the expiration date. NOTE: This sheet is a summary. It may not cover all possible information. If you have questions about this medicine, talk to your doctor, pharmacist, or health care provider.  2019 Elsevier/Gold Standard (2015-09-22 13:20:23)

## 2018-04-25 LAB — CBC WITH DIFFERENTIAL/PLATELET
Absolute Monocytes: 814 cells/uL (ref 200–950)
Basophils Absolute: 58 cells/uL (ref 0–200)
Basophils Relative: 0.8 %
EOS ABS: 72 {cells}/uL (ref 15–500)
Eosinophils Relative: 1 %
HCT: 42 % (ref 35.0–45.0)
HEMOGLOBIN: 13.9 g/dL (ref 11.7–15.5)
Lymphs Abs: 1814 cells/uL (ref 850–3900)
MCH: 30.6 pg (ref 27.0–33.0)
MCHC: 33.1 g/dL (ref 32.0–36.0)
MCV: 92.5 fL (ref 80.0–100.0)
MPV: 11 fL (ref 7.5–12.5)
Monocytes Relative: 11.3 %
Neutro Abs: 4442 cells/uL (ref 1500–7800)
Neutrophils Relative %: 61.7 %
Platelets: 233 10*3/uL (ref 140–400)
RBC: 4.54 10*6/uL (ref 3.80–5.10)
RDW: 12.1 % (ref 11.0–15.0)
Total Lymphocyte: 25.2 %
WBC: 7.2 10*3/uL (ref 3.8–10.8)

## 2018-04-25 LAB — BASIC METABOLIC PANEL WITH GFR
BUN: 17 mg/dL (ref 7–25)
CO2: 30 mmol/L (ref 20–32)
Calcium: 10 mg/dL (ref 8.6–10.4)
Chloride: 104 mmol/L (ref 98–110)
Creat: 0.86 mg/dL (ref 0.60–0.88)
GFR, Est African American: 70 mL/min/{1.73_m2} (ref >=60)
GFR, Est Non African American: 60 mL/min/{1.73_m2} (ref >=60)
Glucose, Bld: 65 mg/dL (ref 65–99)
Potassium: 4.7 mmol/L (ref 3.5–5.3)
Sodium: 142 mmol/L (ref 135–146)

## 2018-05-16 ENCOUNTER — Ambulatory Visit (HOSPITAL_COMMUNITY)
Admission: RE | Admit: 2018-05-16 | Discharge: 2018-05-16 | Disposition: A | Discharge status: Home / Self Care | Payer: Medicare Other | Source: Ambulatory Visit | Attending: Adult Health | Admitting: Adult Health

## 2018-05-16 ENCOUNTER — Encounter: Payer: Self-pay | Admitting: Adult Health

## 2018-05-16 ENCOUNTER — Ambulatory Visit (INDEPENDENT_AMBULATORY_CARE_PROVIDER_SITE_OTHER): Payer: Medicare Other | Admitting: Adult Health

## 2018-05-16 ENCOUNTER — Other Ambulatory Visit: Payer: Self-pay | Admitting: Adult Health

## 2018-05-16 VITALS — BP 148/80 | HR 71 | Temp 96.6°F | Ht 63.5 in | Wt 144.0 lb

## 2018-05-16 DIAGNOSIS — R05 Cough: Secondary | ICD-10-CM

## 2018-05-16 DIAGNOSIS — R053 Chronic cough: Secondary | ICD-10-CM

## 2018-05-16 DIAGNOSIS — L989 Disorder of the skin and subcutaneous tissue, unspecified: Secondary | ICD-10-CM

## 2018-05-16 DIAGNOSIS — J209 Acute bronchitis, unspecified: Secondary | ICD-10-CM | POA: Insufficient documentation

## 2018-05-16 DIAGNOSIS — J208 Acute bronchitis due to other specified organisms: Secondary | ICD-10-CM | POA: Insufficient documentation

## 2018-05-16 MED ORDER — UMECLIDINIUM-VILANTEROL 62.5-25 MCG/INH IN AEPB: 1.0000 | INHALATION_SPRAY | Freq: Every day | RESPIRATORY_TRACT | 2 refills | Status: DC

## 2018-05-16 MED ORDER — PROMETHAZINE-DM 6.25-15 MG/5ML PO SYRP: 5.0000 mL | ORAL_SOLUTION | Freq: Four times a day (QID) | ORAL | 1 refills | Status: DC | PRN

## 2018-05-16 MED ORDER — PREDNISONE 20 MG PO TABS: ORAL_TABLET | ORAL | 0 refills | Status: DC

## 2018-05-16 MED ORDER — DOXYCYCLINE HYCLATE 100 MG PO CAPS: ORAL_CAPSULE | ORAL | 0 refills | Status: DC

## 2018-05-16 NOTE — Progress Notes (Signed)
Assessment and Plan:  Misty Blackwell was seen today for cough.  Diagnoses and all orders for this visit:  Persistent cough for 3 weeks or longer Persistent productive cough concerning for pneumonia, exam limited by frequent cough Recent hospitalization, zpak use, abx allergies limit abx selection Will proceed with doxycycline for now pending CXR She will call coumadin clinic for instructions on dose adjustment and has follow up in 2 days Monitor closely, Go to the ER if any chest pain, shortness of breath, nausea, dizziness, severe HA, changes vision/speech She expresses understanding and will call back with progress tomorrow Follow up if not improving -     predniSONE (DELTASONE) 20 MG tablet; 2 tablets daily for 3 days, 1 tablet daily for 4 days. -     promethazine-dextromethorphan (PROMETHAZINE-DM) 6.25-15 MG/5ML syrup; Take 5 mLs by mouth 4 (four) times daily as needed for cough. -     DG Chest 2 View; Future -     doxycycline (VIBRAMYCIN) 100 MG capsule; Take 1 capsule twice daily with food  Skin lesion of neck L side, ~1cm, Concerning for basal cell carcinoma, patient has appointment scheduled with derm for 06/30/2018 Call back if changing quickly and can try to refer her sooner to skin cancer center but she declines this today. Advised to monitor closely.   Further disposition pending results of labs. Discussed med's effects and SE's.   Over 15 minutes of exam, counseling, chart review, and critical decision making was performed.   Future Appointments  Date Time Provider [place]  05/18/2018  9:30 AM [facility]  05/29/2018 10:45 AM Liane Comber, NP [facility] None  07/19/2018  7:35 AM [facility] DEVICE REMOTES [facility]  08/30/2018 10:30 AM [name], MD [facility] None  03/12/2019 10:00 AM [name], MD [facility] None     ------------------------------------------------------------------------------------------------------------------   HPI BP (!) 148/80   Pulse 71   Temp (!) 96.6 F (35.9 C)   Ht 5' 3.5" (1.613 m)   Wt 144 lb (65.3 kg)   SpO2 97%   BMI 25.11 kg/m   85 y.o.female with hx of dCHF, a. Fib on coumadin (managed by a fib clinic), recent TIA, presents for evaluation of persistent cough ongoing for 3 weeks that she reports is ongoing since her hospitalization for TIA from 04/17/2018- 04/18/2018. She reports cough was initially non-productive, has been taking "elderberry cough syrup" which did help with productivity and has been coughing up thick white phlegm that has progressed to yellow. She reports large quantity. She has been sitting up to sleep due to cough when lying supine. She endorses aching through her shoulders and chest with coughing fits, she reports shortness of breath only after coughing fits. She denies fatigue, fever/chills. Speaks in full sentences without difficulty, frequent cough in office. Denies sore throat, nasal congestion, nausea, tender lymph nodes, nausea, rash, headache.    She reports hx of mild allergies in the fall, denies currently having her typical symptoms of sneezing, running nose, watery/itchy eyes  No hx of COPD or asthma, denies ever having symptoms of GERD or reflux.    She recalls she recently has taken a zpak, ? November or early December, no documentation in chart, can't recall where she got it or what for.   Past Medical History:  Diagnosis Date  . Atrial fib/flutter, transient   . Atrial fibrillation, chronic   . CHF (congestive heart failure) ([place]) 10/29/2009   Echo - EF >55%; normal LV size and systolic function; unable to  assess diastolic fcn due to E/A fusion, pulmonary vein flow pattern suggests elevated filling pressure; marked biatrail dilation, mild/mod tricuspid regurgitation; mod pulmonary htn; mild/mod mitral regurgitation; although  echocardiographic features are incomplete findings suggest possible infiltrative cardiomyopathy (maybe amyloidosi  . Coronary artery disease 03/19/2002   R/P Cardiolite - EF 76%; nromal static and dynamic myocardial perfusion images; normal wall motion and endocardial thickening in all vascular territories  . Facial numbness 12/26/2008   carotid doppler - R and L ICAs 0-49% diameter reduction (velocities suggest low end of scale)  . Hypertension   . Pacemaker   . Peripheral neuropathy   . Skin cancer    s/p surgical removal.  . TIA (transient ischemic attack)      Allergies  Allergen Reactions  . Latex Itching  . Ace Inhibitors Other (See Comments)    Unknown reaction  . Acrylic Polymer [Carbomer] Other (See Comments)  . Augmentin [Amoxicillin-Pot Clavulanate] Other (See Comments)  . Ciprofloxacin Other (See Comments)  . Levaquin [Levofloxacin In D5w] Other (See Comments)  . Zocor [Simvastatin] Other (See Comments)  . Chocolate Other (See Comments)    migraine's   . Gabapentin Other (See Comments)    Unsteady gait     Current Outpatient Medications on File Prior to Visit  Medication Sig  . ALPRAZolam (XANAX) 1 MG tablet Take 1/2-1 tablet 1 to 2 x /day ONLY if needed for Anxiety Attack &  Please try to limit to 5 days  /week to avoid addiction (Patient taking differently: 0.5 mg at bedtime as needed for anxiety. )  . BIOTIN PO Take 1 tablet by mouth daily.  . Cholecalciferol (VITAMIN D3) 3000 units TABS Take 3,000 tablets by mouth daily.   . Cyanocobalamin (VITAMIN B 12 PO) Take 1 tablet by mouth daily.  Marland Kitchen diltiazem (TIAZAC) 180 MG 24 hr capsule Take 1 capsule (180 mg total) by mouth daily.  Marland Kitchen escitalopram (LEXAPRO) 5 MG tablet Take 1 tablet (5 mg total) by mouth daily.  . furosemide (LASIX) 40 MG tablet Take 1 tablet (40 mg total) by mouth daily.  . IRON PO Take 65 mg by mouth daily.   Marland Kitchen MAGNESIUM PO Take 1 tablet by mouth daily.  . Multiple Vitamins-Minerals (ZINC PO) Take 1  tablet by mouth daily.  . nitroGLYCERIN (NITROSTAT) 0.4 MG SL tablet Place 0.4 mg under the tongue every 5 (five) minutes as needed for chest pain.  . potassium chloride SA (K-DUR,KLOR-CON) 10 MEQ tablet Take 1 tablet (10 mEq total) by mouth daily.  . pravastatin (PRAVACHOL) 20 MG tablet Take 1 tablet (20 mg total) by mouth daily at 6 PM.  . traZODone (DESYREL) 150 MG tablet Take 0.5 tablets by mouth at bedtime.  Marland Kitchen warfarin (COUMADIN) 3 MG tablet TAKE 1 TO 1 AND 1/2 TABLET BY MOUTH TABLETS AS DIRECTED (Patient taking differently: Take 3-4.5 mg by mouth daily. Take 4.5mg  on Mon, Wed, Fri, and Sat and 3mg  on Tues, Thurs, and Sun)  . telmisartan (MICARDIS) 80 MG tablet Take 1/2 to 1 tablet every morning for BP & Heart (Patient taking differently: Take 40 mg by mouth daily. )   No current facility-administered medications on file prior to visit.     ROS: all negative except above.   Physical Exam:  BP (!) 148/80   Pulse 71   Temp (!) 96.6 F (35.9 C)   Ht 5' 3.5" (1.613 m)   Wt 144 lb (65.3 kg)   SpO2 97%   BMI 25.11 kg/m  General Appearance: Well nourished, in no acute distress ,appears to be feeling unwell Eyes: PERRLA, EOMs, conjunctiva no swelling or erythema Sinuses: No Frontal/maxillary tenderness ENT/Mouth: Ext aud canals clear, TMs without erythema, bulging. No erythema, swelling, or exudate on post pharynx.  Tonsils not swollen or erythematous. Hearing normal.  Neck: Supple, thyroid normal.  Respiratory: Respiratory effort normal, BS limited by frequent cough with attempts to take deep breaths, no audible rales, rhonchi, wheezing or stridor.  Cardio: RRR with no MRGs. Brisk peripheral pulses without edema.  Abdomen: Soft, + BS.  Non tender. Lymphatics: Non tender without lymphadenopathy.  Musculoskeletal: normal gait.  Skin: Warm, dry; she has ~1cm lesion to left neck, raised/papular, with scab, pearly borders, visible telangiectasia (per patient appeared 2 weeks ago, has  derm appointment scheduled 06/30/2018). Neuro: Normal muscle tone Psych: Awake and oriented X 3, normal affect, Insight and Judgment appropriate.     Izora Ribas, NP 12:19 PM [facility]

## 2018-05-16 NOTE — Patient Instructions (Signed)
Healthcare-Associated Pneumonia  Healthcare-associated pneumonia is a lung infection that a person can get when in a health care setting or during certain procedures. The infection causes air sacs inside the lungs to fill with pus or fluid. Healthcare-associated pneumonia is usually caused by bacteria that are common in health care settings. These bacteria may be resistant to some antibiotic medicines. What are the causes? This condition is caused by bacteria that get into your lungs. You can get this condition if you:  Breathe in droplets from an infected person's cough or sneeze.  Touch something that an infected person coughed or sneezed on and then touch your mouth, nose, or eyes.  Have a bacterial infection somewhere else in your body, if the bacteria spread to your lungs through your blood. What increases the risk? This condition is more likely to develop in people who:  Have a disease that weakens their body's defense system (immune system) or their ability to cough out germs.  Are older than age 65.  Having trouble swallowing.  Use a feeding or breathing tube.  Have a cold or the flu.  Have an IV tube inserted in a vein.  Have surgery.  Have a bed sore.  Live in a long-term care facility, such as a nursing home.  Were in the hospital for two or more days in the past 3 months.  Received hemodialysis in the past 30 days. What are the signs or symptoms? Symptoms of this condition include:  Fever.  Chills.  Cough.  Shortness of breath.  Wheezing or crackling sounds when breathing. How is this diagnosed? This condition may be diagnosed based on:  Your symptoms.  A chest X-ray.  A measurement of the amount of oxygen in your blood. How is this treated? This condition is treated with antibiotics. Your health care provider may take a sample of cells (culture) from your throat to determine what type of bacteria is in your lungs and change your antibiotic based  on the results. If you have bacteria in your blood, trouble breathing, or a low oxygen level, you may need to be treated at the hospital. At the hospital, you will be given antibiotics through an IV tube. You may also be given oxygen or breathing treatments. Follow these instructions at home: Medicine  Take your antibiotic medicine as told by your health care provider. Do not stop taking the antibiotic even if you start to feel better.  Take over-the-counter and other prescription medicines only as told by your health care provider. Activity  Rest at home until you feel better.  Return to your normal activities as told by your health care provider. Ask your health care provider what activities are safe for you. General instructions   Drink enough fluid to keep your urine clear or pale yellow.  Do not use any products that contain nicotine or tobacco, such as cigarettes and e-cigarettes. If you need help quitting, ask your health care provider.  Limit alcohol intake to no more than 1 drink per day for nonpregnant women and 2 drinks per day for men. One drink equals 12 oz of beer, 5 oz of wine, or 1 oz of hard liquor.  Keep all follow-up visits as told by your health care provider. This is important. How is this prevented? Actions that I can take To lower your risk of getting this condition again:  Do not smoke. This includes e-cigarettes.  Do not drink too much alcohol.  Keep your immune system healthy by eating   well and getting enough sleep.  Get a flu shot every year (annually).  Get a pneumonia vaccination if: ? You are older than age 79. ? You smoke. ? You have a long-lasting condition like lung disease.  Exercise your lungs by taking deep breaths, walking, and using an incentive spirometer as directed.  Wash your hands often with soap and water. If you cannot get to a sink to wash your hands, use an alcohol-based hand cleaner.  Make sure your health care providers are  washing their hands. If you do not see them wash their hands, ask them to do so.  When you are in a health care facility, avoid touching your eyes, nose, and mouth.  Avoid touching any surface near where people have coughed or sneezed.  Stand away from sick people when they are coughing or sneezing.  Wear a mask if you cannot avoid exposure to people who are sick.  Clean all surfaces often with a disinfectant cleaner, especially if someone is sick at home or work.  Precautions of my health care team Hospitals, nursing homes, and other health care facilities take special care to try to prevent healthcare-associated pneumonia. To do this, your health care team may:  Clean their hands with soap and water or with alcohol-based hand sanitizer before and after seeing patients.  Wear gloves or masks during treatment.  Sanitize medical instruments, tubes, other equipment, and surfaces in patient rooms.  Raise (elevate) the head of your hospital bed so you are not lying flat. The head of the bed may be elevated 30 degrees or more.  Have you sit up and move around as soon as possible after surgery.  Only insert a breathing tube if needed.  Do these things for you if you have a breathing tube: ? Clean the inside of your mouth regularly. ? Remove the breathing tube as soon as it is no longer needed. Contact a health care provider if:  Your symptoms do not get better or they get worse.  Your symptoms come back after you have finished taking your antibiotics. Get help right away if:  You have trouble breathing.  You have confusion or difficulty thinking. This information is not intended to replace advice given to you by your health care provider. Make sure you discuss any questions you have with your health care provider. Document Released: 09/09/2015 Document Revised: 02/03/2016 Document Reviewed: 01/16/2016 Elsevier Interactive Patient Education  2019 [place] VIRAL COUGH AND COLD SYMPTOMS:  -Symptoms usually last at least 1 week with the worst symptoms being around day 4.  - colds usually start with a sore throat and end with a cough, and the cough can take 2 weeks to get better.  -No antibiotics are needed for colds, flu, sore throats, cough, bronchitis UNLESS symptoms are longer than 7 days OR if you are getting better then get drastically worse.  -There are a lot of combination medications (Dayquil, Nyquil, Vicks 44, tyelnol cold and sinus, ETC). Please look at the ingredients on the back so that you are treating the correct symptoms and not doubling up on medications/ingredients.    Medicines you can use  Nasal congestion  Little Remedies saline spray (aerosol/mist)- can try this, it is in the kids section - pseudoephedrine (Sudafed)- behind the counter, do not use if you have high blood pressure, medicine that have -D in them.  - phenylephrine (Sudafed PE) -Dextormethorphan + chlorpheniramine (Coridcidin HBP)- okay if  you have high blood pressure -Oxymetazoline (Afrin) nasal spray- LIMIT to 3 days -Saline nasal spray -Neti pot (used distilled or bottled water)  Ear pain/congestion  -pseudoephedrine (sudafed) - Nasonex/flonase nasal spray  Fever  -Acetaminophen (Tyelnol) -Ibuprofen (Advil, motrin, aleve)  Sore Throat  -Acetaminophen (Tyelnol) -Ibuprofen (Advil, motrin, aleve) -Drink a lot of water -Gargle with salt water - Rest your voice (don't talk) -Throat sprays -Cough drops  Body Aches  -Acetaminophen (Tyelnol) -Ibuprofen (Advil, motrin, aleve)  Headache  -Acetaminophen (Tyelnol) -Ibuprofen (Advil, motrin, aleve) - Exedrin, Exedrin Migraine  Allergy symptoms (cough, sneeze, runny nose, itchy eyes) -Claritin or loratadine cheapest but likely the weakest  -Zyrtec or certizine at night because it can make you sleepy -The strongest is allegra or fexafinadine  Cheapest at [employer], [employer],  [employer]  Cough  -Dextromethorphan (Delsym)- medicine that has DM in it -Guafenesin (Mucinex/Robitussin) - cough drops - drink lots of water  Chest Congestion  -Guafenesin (Mucinex/Robitussin)  Red Itchy Eyes  - Naphcon-A  Upset Stomach  - Bland diet (nothing spicy, greasy, fried, and high acid foods like tomatoes, oranges, berries) -OKAY- cereal, bread, soup, crackers, rice -Eat smaller more frequent meals -reduce caffeine, no alcohol -Loperamide (Imodium-AD) if diarrhea -Prevacid for heart burn  General health when sick  -Hydration -wash your hands frequently -keep surfaces clean -change pillow cases and sheets often -Get fresh air but do not exercise strenuously -Vitamin D, double up on it - Vitamin C -Zinc

## 2018-05-16 NOTE — Progress Notes (Signed)
CXR shows no pneumonia, pleural effusion or pneumothorax. New COPD/emphysematous changes noted. Trial of anoro suggested, 2 week sample provided. Instructed to call back with response.

## 2018-05-18 ENCOUNTER — Ambulatory Visit (INDEPENDENT_AMBULATORY_CARE_PROVIDER_SITE_OTHER): Payer: Medicare Other | Admitting: Pharmacist

## 2018-05-18 DIAGNOSIS — Z7901 Long term (current) use of anticoagulants: Secondary | ICD-10-CM | POA: Diagnosis not present

## 2018-05-18 DIAGNOSIS — I482 Chronic atrial fibrillation, unspecified: Secondary | ICD-10-CM

## 2018-05-18 LAB — POCT INR: INR: 1.8 — AB (ref 2.0–3.0)

## 2018-05-21 LAB — CUP PACEART REMOTE DEVICE CHECK
Battery Impedance: 1851 Ohm
Battery Remaining Longevity: 37 mo
Battery Voltage: 2.76 V
Brady Statistic RV Percent Paced: 71 %
Date Time Interrogation Session: 20191218152118
Implantable Lead Implant Date: 20110428
Implantable Lead Implant Date: 20110428
Implantable Lead Location: 753859
Implantable Lead Location: 753860
Implantable Lead Model: 4092
Implantable Pulse Generator Implant Date: 20110428
Lead Channel Impedance Value: 67 Ohm
Lead Channel Impedance Value: 786 Ohm
Lead Channel Pacing Threshold Amplitude: 1 V
Lead Channel Setting Pacing Pulse Width: 0.4 ms
Lead Channel Setting Sensing Sensitivity: 5.6 mV
MDC IDC MSMT LEADCHNL RV PACING THRESHOLD PULSEWIDTH: 0.4 ms
MDC IDC SET LEADCHNL RV PACING AMPLITUDE: 2.5 V

## 2018-05-26 NOTE — Progress Notes (Deleted)
[name], female   DOB: 02-09-1928, 85 y.o.   MRN: [id number]  MEDICARE ANNUAL WELLNESS VISIT AND FOLLOW UP  Assessment:    Diagnoses and all orders for this visit:  Encounter for Medicare annual wellness exam  Essential hypertension Continue medication Monitor blood pressure at home; call if consistently over 130/80 Continue DASH diet.   Reminder to go to the ER if any CP, SOB, nausea, dizziness, severe HA, changes vision/speech, left arm numbness and tingling and jaw pain.  Atherosclerosis of native coronary artery of native heart with angina pectoris ([place]) Control blood pressure, cholesterol, glucose, increase exercise.  Followed closely by cardiology No chest pains recently, had perfusion studies and ECHO in April 2019   Chronic diastolic heart failure (HCC) Weights stable, monitor, no symptoms at this time  Permanent atrial fibrillation ([place]) Followed by coumadin clinic Reminded to go to ER for any head injury, call with concerning signs of bleeding  Gastroesophageal reflux disease, esophagitis presence not specified Symptoms controlled at this time, lifestyle emphasized  Osteoarthritis, unspecified osteoarthritis type, unspecified site Mild in hands, declines treatment  Vitamin D deficiency Near goal at recent check;  Defer vitamin D level  Prediabetes Discussed disease and risks Discussed diet/exercise, weight management  A1C  Pacemaker Followed by cardiology  Medication management CBC, CMP/GFR  Long term current use of anticoagulant therapy Followed by cardiology  Hyperlipidemia No longer on medication secondary to age Continue low cholesterol diet and exercise.  Check lipid panel.   Fibrocystic breast disease (FCBD), unspecified laterality Refuses further mammograms  Overweight (BMI 25.0-29.9) Continue to recommend diet heavy in fruits and veggies and low in animal meats, cheeses, and dairy products, appropriate calorie  intake Discuss exercise recommendations routinely Continue to monitor weight at each visit  TIA Continue coumadin, ASA/plavix not recommended Continue statin for LDL goal <70 No neurology follow up was recommended at time of discharge - continue to monitor in office Control blood pressure, cholesterol, glucose, increase exercise.    Over 30 minutes of exam, counseling, chart review, and critical decision making was performed  Future Appointments  Date Time Provider [place]  05/29/2018 10:45 AM Liane Comber, NP [facility] None  06/02/2018  9:30 AM [facility]  07/19/2018  7:35 AM [facility] DEVICE REMOTES [facility]  08/30/2018 10:30 AM [name], MD [facility] None  03/12/2019 10:00 AM [name], MD [facility] None     Plan:   During the course of the visit the patient was educated and counseled about appropriate screening and preventive services including:    Pneumococcal vaccine   Influenza vaccine  Td vaccine  Prevnar 13  Screening electrocardiogram  Screening mammography  Bone densitometry screening  Colorectal cancer screening  Diabetes screening  Glaucoma screening  Nutrition counseling   Advanced directives: given info/requested copies    Subjective:   Misty Blackwell is a 85 y.o. female who presents for Medicare Annual Wellness Visit and 6 month follow up.   Recently she had aphasia in 04/2018 and was admitted for evaluation; aphasia fully resolved and was concluded to be a TIA. She completed ASA 325 mg as bridge until coumadin was back to therapeutic range, and initiated on pravastatin 20 mg daily and recommended aggressive LDL goal of <70 but otherwise was not recommended ongoing follow up by neuro and will be monitored at this office.    Patient had negative /Nl Ht caths in x2 in 1993 & 2007. In 2003, her Myoview was Negative for  Ischemia.  In 2006 , she had an embolic CVA with  Rt hemifacial paresthesias (resolved) and was found in Afib and has been on Coumadin since - monitored at [facility]. She had a PPM implanted in 2007 for SSS and is followed by Dr Sallyanne Kuster.She recently had perfusion studies and ECHO in April of 2019 with good results.   Lab Results  Component Value Date   INR 1.8 (A) 05/18/2018   INR 2.0 04/20/2018   INR 1.84 04/18/2018   She has some problems with anxiety, was having panic attacks a few months ago but improved recently, she is prescribed xanax and takes 1/2 tab at night to sleep, and may take a 1/2 tab in the afternoon, takes 1-2 time per week, as needed only.   She is also on lexapro 5 mg daily, and trazodone ~100 mg at night ***  BMI is There is no height or weight on file to calculate BMI., she has been working on diet and exercise. Wt Readings from Last 3 Encounters:  05/16/18 144 lb (65.3 kg)  04/24/18 144 lb 8 oz (65.5 kg)  02/22/18 146 lb 9.6 oz (66.5 kg)   Her blood pressure has been controlled at home, today their BP is    She does not workout. She denies chest pain, shortness of breath, dizziness.   She is not on cholesterol medication secondary to age. Her cholesterol is not at goal. The cholesterol last visit was:   Lab Results  Component Value Date   CHOL 167 02/22/2018   HDL 41 (L) 02/22/2018   LDLCALC 105 (H) 02/22/2018   TRIG 118 02/22/2018   CHOLHDL 4.1 02/22/2018   Last A1C in the office was:  Lab Results  Component Value Date   HGBA1C 5.7 (H) 02/22/2018   Patient is on Vitamin D supplement.   Lab Results  Component Value Date   VD25OH 58 02/22/2018      Medication Review Current Outpatient Medications on File Prior to Visit  Medication Sig Dispense Refill  . ALPRAZolam (XANAX) 1 MG tablet Take 1/2-1 tablet 1 to 2 x /day ONLY if needed for Anxiety Attack &  Please try to limit to 5 days  /week to avoid addiction (Patient taking differently: 0.5 mg at bedtime as needed for anxiety. ) 60 tablet 0   . BIOTIN PO Take 1 tablet by mouth daily.    . Cholecalciferol (VITAMIN D3) 3000 units TABS Take 3,000 tablets by mouth daily.     . Cyanocobalamin (VITAMIN B 12 PO) Take 1 tablet by mouth daily.    Marland Kitchen diltiazem (TIAZAC) 180 MG 24 hr capsule Take 1 capsule (180 mg total) by mouth daily. 90 capsule 3  . doxycycline (VIBRAMYCIN) 100 MG capsule Take 1 capsule twice daily with food 20 capsule 0  . escitalopram (LEXAPRO) 5 MG tablet Take 1 tablet (5 mg total) by mouth daily. 90 tablet 1  . furosemide (LASIX) 40 MG tablet Take 1 tablet (40 mg total) by mouth daily. 90 tablet 3  . IRON PO Take 65 mg by mouth daily.     Marland Kitchen MAGNESIUM PO Take 1 tablet by mouth daily.    . Multiple Vitamins-Minerals (ZINC PO) Take 1 tablet by mouth daily.    . nitroGLYCERIN (NITROSTAT) 0.4 MG SL tablet Place 0.4 mg under the tongue every 5 (five) minutes as needed for chest pain.    . potassium chloride SA (K-DUR,KLOR-CON) 10 MEQ tablet Take 1 tablet (10 mEq total) by mouth  daily.    . pravastatin (PRAVACHOL) 20 MG tablet Take 1 tablet (20 mg total) by mouth daily at 6 PM. 30 tablet 0  . predniSONE (DELTASONE) 20 MG tablet 2 tablets daily for 3 days, 1 tablet daily for 4 days. 10 tablet 0  . promethazine-dextromethorphan (PROMETHAZINE-DM) 6.25-15 MG/5ML syrup Take 5 mLs by mouth 4 (four) times daily as needed for cough. 240 mL 1  . telmisartan (MICARDIS) 80 MG tablet Take 1/2 to 1 tablet every morning for BP & Heart (Patient taking differently: Take 40 mg by mouth daily. ) 90 tablet 1  . traZODone (DESYREL) 150 MG tablet Take 0.5 tablets by mouth at bedtime.    Marland Kitchen umeclidinium-vilanterol (ANORO ELLIPTA) 62.5-25 MCG/INH AEPB Inhale 1 puff into the lungs daily. Make sure you rinse your mouth after each use. 1 each 2  . warfarin (COUMADIN) 3 MG tablet TAKE 1 TO 1 AND 1/2 TABLET BY MOUTH TABLETS AS DIRECTED (Patient taking differently: Take 3-4.5 mg by mouth daily. Take 4.5mg  on Mon, Wed, Fri, and Sat and 3mg  on Tues, Thurs, and  Sun) 135 tablet 2   No current facility-administered medications on file prior to visit.     Current Problems (verified) Patient Active Problem List   Diagnosis Date Noted  . COPD (chronic obstructive pulmonary disease) ([place]) 05/16/2018  . Neural foraminal stenosis of cervical spine 04/18/2018  . TIA (transient ischemic attack) 04/17/2018  . Atrial fibrillation, chronic   . Anxiety 11/22/2017  . Overweight (BMI 25.0-29.9) 11/21/2017  . Chronic diastolic heart failure ([place]) 11/10/2017  . Prediabetes 08/15/2013  . Vitamin D deficiency 08/15/2013  . Medication management 08/15/2013  . Pacemaker 09/19/2012  . Long term current use of anticoagulant therapy 07/18/2012  . Hyperlipidemia 09/03/2008  . Essential hypertension 09/03/2008  . Coronary atherosclerosis 09/03/2008  . GERD 09/03/2008  . FIBROCYSTIC BREAST DISEASE 09/03/2008  . Osteoarthritis 09/03/2008    Screening Tests Immunization History  Administered Date(s) Administered  . DT 03/19/2015  . Influenza Split 05/04/2011  . Influenza, High Dose Seasonal PF 03/19/2014, 12/23/2015  . Influenza,inj,quad, With Preservative 05/21/2013  . Influenza-Unspecified 02/04/2015, 02/03/2017  . Pneumococcal Conjugate-13 03/19/2014  . Pneumococcal Polysaccharide-23 05/04/2011  . Pneumococcal-Unspecified 05/03/2001  . Td 05/04/2003    Preventative care: Last colonoscopy: 2010 Last mammogram: refused DEXA declines  Prior vaccinations: TD or Tdap: 2016  Influenza: 2018  Pneumococcal: 2003 Prevnar13: 2015  Shingles/Zostavax: Declined  Names of Other Physician/Practitioners you currently use: 1. [facility]- here for primary care 2. Dr. Katy Fitch , eye doctor, last visit 2019 3. Dr. Ronnald Ramp, dentist, last visit 2019  Patient Care Team: [name], MD as PCP - General (Internal Medicine) [name], MD as Consulting Physician (Urology) Penni Bombard, MD as Consulting Physician  (Neurology) [name], MD as Consulting Physician (Neurology) Sanda Klein, MD as Consulting Physician (Cardiology) [name], MD (Inactive) as Consulting Physician (Gastroenterology)  Allergies Allergies  Allergen Reactions  . Latex Itching  . Ace Inhibitors Other (See Comments)    Unknown reaction  . Acrylic Polymer [Carbomer] Other (See Comments)  . Augmentin [Amoxicillin-Pot Clavulanate] Other (See Comments)  . Ciprofloxacin Other (See Comments)  . Levaquin [Levofloxacin In D5w] Other (See Comments)  . Zocor [Simvastatin] Other (See Comments)  . Chocolate Other (See Comments)    migraine's   . Gabapentin Other (See Comments)    Unsteady gait     SURGICAL HISTORY She  has a past surgical history that includes Cardioversion (11/19/2009);  Cardiac catheterization (08/06/2005); Cholecystectomy; Appendectomy; Abdominal hysterectomy; and Skin cancer resection. FAMILY HISTORY Her family history includes Cirrhosis in her brother; Diabetes in her mother; Heart attack in her father; Heart disease in her father and mother. SOCIAL HISTORY She  reports that she has never smoked. She has never used smokeless tobacco. She reports that she does not drink alcohol or use drugs.  MEDICARE WELLNESS OBJECTIVES: Physical activity:   Cardiac risk factors:   Depression/mood screen:   Depression screen [facility] 2/9 02/25/2018  Decreased Interest 0  Down, Depressed, Hopeless 0  PHQ - 2 Score 0  Altered sleeping -  Tired, decreased energy -  Change in appetite -  Feeling bad or failure about yourself  -  Trouble concentrating -  Moving slowly or fidgety/restless -  Suicidal thoughts -  PHQ-9 Score -  Difficult doing work/chores -    ADLs:  In your present state of health, do you have any difficulty performing the following activities: 04/18/2018 02/25/2018  Hearing? N N  Comment - -  Vision? N N  Difficulty concentrating or making decisions? N N  Walking or climbing stairs? Y N   Dressing or bathing? N N  Doing errands, shopping? N N  Some recent data might be hidden     Cognitive Testing  Alert? Yes  Normal Appearance?Yes  Oriented to person? Yes  Place? Yes   Time? Yes  Recall of three objects?  Yes  Can perform simple calculations? Yes  Displays appropriate judgment?Yes  Can read the correct time from a watch face?Yes  EOL planning:     Objective:   There were no vitals filed for this visit. There is no height or weight on file to calculate BMI.  General appearance: alert, no distress, WD/WN,  female HEENT: normocephalic, sclerae anicteric, TMs pearly, nares patent, no discharge or erythema, pharynx normal, hearing normal with bilateral hearing aids in  Oral cavity: MMM, no lesions Neck: supple, no lymphadenopathy, no thyromegaly, no masses Heart: RRR, normal S1, S2, no murmurs Lungs: CTA bilaterally, no wheezes, rhonchi, or rales Abdomen: +bs, soft, non tender, non distended, no masses, no hepatomegaly, no splenomegaly Musculoskeletal: nontender, no swelling, no obvious deformity Extremities: no edema, no cyanosis, no clubbing Pulses: 2+ symmetric, upper and lower extremities, normal cap refill Neurological: alert, oriented x 3, CN2-12 intact, strength normal upper extremities and lower extremities, sensation normal throughout, DTRs 2+ throughout, no cerebellar signs, gait normal Psychiatric: normal affect, behavior normal, pleasant  Breast: defer Gyn: defer Rectal: defer   Medicare Attestation I have personally reviewed: The patient's medical and social history Their use of alcohol, tobacco or illicit drugs Their current medications and supplements The patient's functional ability including ADLs,fall risks, home safety risks, cognitive, and hearing and visual impairment Diet and physical activities Evidence for depression or mood disorders  The patient's weight, height, BMI, and visual acuity have been recorded in the chart.  I have made  referrals, counseling, and provided education to the patient based on review of the above and I have provided the patient with a written personalized care plan for preventive services.     Izora Ribas, NP   05/26/2018

## 2018-05-29 ENCOUNTER — Other Ambulatory Visit: Payer: Self-pay

## 2018-05-29 ENCOUNTER — Emergency Department (HOSPITAL_COMMUNITY)
Admission: EM | Admit: 2018-05-29 | Discharge: 2018-05-29 | Disposition: A | Discharge status: Home / Self Care | Payer: Medicare Other | Attending: Emergency Medicine | Admitting: Emergency Medicine

## 2018-05-29 ENCOUNTER — Ambulatory Visit: Payer: Self-pay | Admitting: Adult Health

## 2018-05-29 ENCOUNTER — Emergency Department (HOSPITAL_COMMUNITY): Payer: Medicare Other

## 2018-05-29 ENCOUNTER — Encounter (HOSPITAL_COMMUNITY): Payer: Self-pay | Admitting: Emergency Medicine

## 2018-05-29 DIAGNOSIS — I5032 Chronic diastolic (congestive) heart failure: Secondary | ICD-10-CM | POA: Insufficient documentation

## 2018-05-29 DIAGNOSIS — Z7901 Long term (current) use of anticoagulants: Secondary | ICD-10-CM | POA: Insufficient documentation

## 2018-05-29 DIAGNOSIS — J209 Acute bronchitis, unspecified: Secondary | ICD-10-CM | POA: Diagnosis not present

## 2018-05-29 DIAGNOSIS — Z950 Presence of cardiac pacemaker: Secondary | ICD-10-CM | POA: Insufficient documentation

## 2018-05-29 DIAGNOSIS — Z7902 Long term (current) use of antithrombotics/antiplatelets: Secondary | ICD-10-CM | POA: Diagnosis not present

## 2018-05-29 DIAGNOSIS — J449 Chronic obstructive pulmonary disease, unspecified: Secondary | ICD-10-CM | POA: Insufficient documentation

## 2018-05-29 DIAGNOSIS — I2510 Atherosclerotic heart disease of native coronary artery without angina pectoris: Secondary | ICD-10-CM | POA: Diagnosis not present

## 2018-05-29 DIAGNOSIS — I110 Hypertensive heart disease with heart failure: Secondary | ICD-10-CM | POA: Insufficient documentation

## 2018-05-29 DIAGNOSIS — Z79899 Other long term (current) drug therapy: Secondary | ICD-10-CM | POA: Insufficient documentation

## 2018-05-29 DIAGNOSIS — Z85828 Personal history of other malignant neoplasm of skin: Secondary | ICD-10-CM | POA: Diagnosis not present

## 2018-05-29 DIAGNOSIS — Z91040 Latex allergy status: Secondary | ICD-10-CM | POA: Insufficient documentation

## 2018-05-29 DIAGNOSIS — R0602 Shortness of breath: Secondary | ICD-10-CM | POA: Diagnosis present

## 2018-05-29 LAB — CBC WITH DIFFERENTIAL/PLATELET
Abs Immature Granulocytes: 0.05 10*3/uL (ref 0.00–0.07)
BASOS ABS: 0.1 10*3/uL (ref 0.0–0.1)
Basophils Relative: 1 %
Eosinophils Absolute: 0.4 10*3/uL (ref 0.0–0.5)
Eosinophils Relative: 4 %
HCT: 44.6 % (ref 36.0–46.0)
Hemoglobin: 13.9 g/dL (ref 12.0–15.0)
Immature Granulocytes: 1 %
Lymphocytes Relative: 26 %
Lymphs Abs: 2.5 10*3/uL (ref 0.7–4.0)
MCH: 31.2 pg (ref 26.0–34.0)
MCHC: 31.2 g/dL (ref 30.0–36.0)
MCV: 100 fL (ref 80.0–100.0)
Monocytes Absolute: 0.7 10*3/uL (ref 0.1–1.0)
Monocytes Relative: 8 %
Neutro Abs: 5.9 10*3/uL (ref 1.7–7.7)
Neutrophils Relative %: 60 %
PLATELETS: 253 10*3/uL (ref 150–400)
RBC: 4.46 MIL/uL (ref 3.87–5.11)
RDW: 13.2 % (ref 11.5–15.5)
WBC: 9.6 10*3/uL (ref 4.0–10.5)
nRBC: 0 % (ref 0.0–0.2)

## 2018-05-29 LAB — COMPREHENSIVE METABOLIC PANEL
ALT: 23 U/L (ref 0–44)
AST: 24 U/L (ref 15–41)
Albumin: 4 g/dL (ref 3.5–5.0)
Alkaline Phosphatase: 92 U/L (ref 38–126)
Anion gap: 8 (ref 5–15)
BUN: 31 mg/dL — ABNORMAL HIGH (ref 8–23)
CO2: 26 mmol/L (ref 22–32)
Calcium: 8.9 mg/dL (ref 8.9–10.3)
Chloride: 106 mmol/L (ref 98–111)
Creatinine, Ser: 0.93 mg/dL (ref 0.44–1.00)
GFR calc Af Amer: >60 mL/min (ref >60)
GFR calc non Af Amer: 55 mL/min — ABNORMAL LOW (ref >60)
Glucose, Bld: 107 mg/dL — ABNORMAL HIGH (ref 70–99)
Potassium: 3.8 mmol/L (ref 3.5–5.1)
SODIUM: 140 mmol/L (ref 135–145)
Total Bilirubin: 0.9 mg/dL (ref 0.3–1.2)
Total Protein: 7.2 g/dL (ref 6.5–8.1)

## 2018-05-29 LAB — TROPONIN I: Troponin I: <0.03 ng/mL (ref <0.03)

## 2018-05-29 LAB — BRAIN NATRIURETIC PEPTIDE: B Natriuretic Peptide: 105.3 pg/mL — ABNORMAL HIGH (ref 0.0–100.0)

## 2018-05-29 MED ORDER — PREDNISONE 20 MG PO TABS
60.0000 mg | ORAL_TABLET | Freq: Once | ORAL | Status: AC
  Administered 2018-05-29: 60 mg via ORAL
  Filled 2018-05-29: qty 3

## 2018-05-29 MED ORDER — ALBUTEROL SULFATE (2.5 MG/3ML) 0.083% IN NEBU
5.0000 mg | INHALATION_SOLUTION | Freq: Once | RESPIRATORY_TRACT | Status: AC
  Administered 2018-05-29: 5 mg via RESPIRATORY_TRACT
  Filled 2018-05-29: qty 6

## 2018-05-29 MED ORDER — PREDNISONE 20 MG PO TABS: 40.0000 mg | ORAL_TABLET | Freq: Every day | ORAL | 0 refills | Status: AC

## 2018-05-29 MED ORDER — ALBUTEROL SULFATE HFA 108 (90 BASE) MCG/ACT IN AERS
2.0000 | INHALATION_SPRAY | RESPIRATORY_TRACT | Status: DC
  Administered 2018-05-29: 2 via RESPIRATORY_TRACT
  Filled 2018-05-29: qty 6.7

## 2018-05-29 MED ORDER — SODIUM CHLORIDE 0.9 % IV BOLUS
250.0000 mL | Freq: Once | INTRAVENOUS | Status: AC
  Administered 2018-05-29: 250 mL via INTRAVENOUS

## 2018-05-29 NOTE — ED Triage Notes (Signed)
Pt sent from PCP related to worsening SOB; pt verbalizes "have been sick since before Christmas."

## 2018-05-29 NOTE — ED Provider Notes (Signed)
[facility] DEPT Provider Note   CSN: [id number] Arrival date & time: 05/29/18  1040     History   Chief Complaint Chief Complaint  Patient presents with  . Shortness of Breath    HPI Misty Blackwell is a 85 y.o. female.  HPI Patient is an 85 year old female who is had ongoing productive cough since the end of December 2019.  She just finished a 10-day course of antibiotics.  She completed this course 3 days ago.  She reports ongoing productive cough and this morning felt somewhat more short of breath.  She denies fever and chills.  She reports her cough is productive.  She does have a history of congestive heart failure but denies orthopnea.  No chest pain.  Denies abdominal pain.  No back pain.  No blood in her stool.  No history of anemia.  She is on chronic anticoagulation.  She does have a history of COPD.  She reports no use of bronchodilators at home.   Past Medical History:  Diagnosis Date  . Atrial fib/flutter, transient   . Atrial fibrillation, chronic   . CHF (congestive heart failure) ([place]) 10/29/2009   Echo - EF >55%; normal LV size and systolic function; unable to assess diastolic fcn due to E/A fusion, pulmonary vein flow pattern suggests elevated filling pressure; marked biatrail dilation, mild/mod tricuspid regurgitation; mod pulmonary htn; mild/mod mitral regurgitation; although echocardiographic features are incomplete findings suggest possible infiltrative cardiomyopathy (maybe amyloidosi  . Coronary artery disease 03/19/2002   R/P Cardiolite - EF 76%; nromal static and dynamic myocardial perfusion images; normal wall motion and endocardial thickening in all vascular territories  . Facial numbness 12/26/2008   carotid doppler - R and L ICAs 0-49% diameter reduction (velocities suggest low end of scale)  . Hypertension   . Pacemaker   . Peripheral neuropathy   . Skin cancer    s/p surgical removal.  . TIA (transient ischemic attack)      Patient Active Problem List   Diagnosis Date Noted  . COPD (chronic obstructive pulmonary disease) ([place]) 05/16/2018  . Neural foraminal stenosis of cervical spine 04/18/2018  . TIA (transient ischemic attack) 04/17/2018  . Atrial fibrillation, chronic   . Anxiety 11/22/2017  . Overweight (BMI 25.0-29.9) 11/21/2017  . Chronic diastolic heart failure ([place]) 11/10/2017  . Prediabetes 08/15/2013  . Vitamin D deficiency 08/15/2013  . Medication management 08/15/2013  . Pacemaker 09/19/2012  . Long term current use of anticoagulant therapy 07/18/2012  . Hyperlipidemia 09/03/2008  . Essential hypertension 09/03/2008  . Coronary atherosclerosis 09/03/2008  . GERD 09/03/2008  . FIBROCYSTIC BREAST DISEASE 09/03/2008  . Osteoarthritis 09/03/2008    Past Surgical History:  Procedure Laterality Date  . ABDOMINAL HYSTERECTOMY    . APPENDECTOMY    . CARDIAC CATHETERIZATION  08/06/2005   minimal coronary disease predominant RCA; no significant atherosclerosis; new onset sick sinus syndrome and atrial flutter w/ ventricular response, controlled on med therapy; systemic HTN, normal renal arteries  . CARDIOVERSION  11/19/2009   successful DCCV from AF to sinus type rhythm  . CHOLECYSTECTOMY    . Skin cancer resection       OB History   No obstetric history on file.      Home Medications    Prior to Admission medications   Medication Sig Start Date End Date Taking? Authorizing Provider  BIOTIN PO Take 1 tablet by mouth daily.   Yes [provider]  Cholecalciferol (VITAMIN D3) 3000 units TABS  Take 3,000 tablets by mouth daily.    Yes [provider]  Cyanocobalamin (VITAMIN B 12 PO) Take 1 tablet by mouth daily.   Yes [provider]  diltiazem (TIAZAC) 180 MG 24 hr capsule Take 1 capsule (180 mg total) by mouth daily. 11/10/17  Yes [name], Mihai, MD  ELDERBERRY PO Take 5 mLs by mouth daily as needed (cough).   Yes [provider]  ELDERBERRY PO  Take 1 lozenge by mouth every 2 (two) hours as needed (cough).   Yes [provider]  escitalopram (LEXAPRO) 5 MG tablet Take 1 tablet (5 mg total) by mouth daily. 04/24/18  Yes Liane Comber, NP  fluticasone (FLONASE) 50 MCG/ACT nasal spray Place 2 sprays into the nose daily as needed for allergies. 05/08/18 06/05/18 Yes [provider]  furosemide (LASIX) 40 MG tablet Take 1 tablet (40 mg total) by mouth daily. Patient taking differently: Take 20 mg by mouth daily.  07/20/17  Yes [name], Mihai, MD  IRON PO Take 65 mg by mouth daily.    Yes [provider]  MAGNESIUM PO Take 1 tablet by mouth daily.   Yes [provider]  Multiple Vitamins-Minerals (ZINC PO) Take 1 tablet by mouth daily.   Yes [provider]  potassium chloride SA (K-DUR,KLOR-CON) 20 MEQ tablet Take 20 mEq by mouth daily.   Yes [provider]  promethazine-dextromethorphan (PROMETHAZINE-DM) 6.25-15 MG/5ML syrup Take 5 mLs by mouth 4 (four) times daily as needed for cough. 05/16/18  Yes Liane Comber, NP  telmisartan (MICARDIS) 80 MG tablet Take 1/2 to 1 tablet every morning for BP & Heart Patient taking differently: Take 40 mg by mouth daily.  07/25/17 05/29/18 Yes [name], MD  traZODone (DESYREL) 150 MG tablet Take 0.5 tablets by mouth at bedtime.   Yes [provider]  umeclidinium-vilanterol (ANORO ELLIPTA) 62.5-25 MCG/INH AEPB Inhale 1 puff into the lungs daily. Make sure you rinse your mouth after each use. 05/16/18  Yes Liane Comber, NP  warfarin (COUMADIN) 3 MG tablet TAKE 1 TO 1 AND 1/2 TABLET BY MOUTH TABLETS AS DIRECTED Patient taking differently: Take 3-4.5 mg by mouth daily. Take 4.5mg  on Mon, Wed, Fri, and Sat and 3mg  on Tues, Thurs, and Sun 04/17/18  Yes [name], [name], MD  ALPRAZolam Duanne Moron) 1 MG tablet Take 1/2-1 tablet 1 to 2 x /day ONLY if needed for Anxiety Attack &  Please try to limit to 5 days  /week to avoid addiction Patient not  taking: Reported on 05/29/2018 03/28/18   [name], MD  doxycycline (VIBRAMYCIN) 100 MG capsule Take 1 capsule twice daily with food Patient not taking: Reported on 05/29/2018 05/16/18   Liane Comber, NP  nitroGLYCERIN (NITROSTAT) 0.4 MG SL tablet Place 0.4 mg under the tongue every 5 (five) minutes as needed for chest pain.    [provider]  potassium chloride SA (K-DUR,KLOR-CON) 10 MEQ tablet Take 1 tablet (10 mEq total) by mouth daily. Patient not taking: Reported on 05/29/2018 11/14/17   [name], [name], MD  pravastatin (PRAVACHOL) 20 MG tablet Take 1 tablet (20 mg total) by mouth daily at 6 PM. Patient not taking: Reported on 05/29/2018 04/19/18   Lady Deutscher, MD  predniSONE (DELTASONE) 20 MG tablet Take 2 tablets (40 mg total) by mouth daily for 5 days. 05/29/18 06/03/18  Jola Schmidt, MD    Family History Family History  Problem Relation Age of Onset  . Heart disease Mother   . Diabetes Mother   .  Heart attack Father   . Heart disease Father   . Cirrhosis Brother     Social History Social History   Tobacco Use  . Smoking status: Never Smoker  . Smokeless tobacco: Never Used  Substance Use Topics  . Alcohol use: No  . Drug use: No     Allergies   Latex; Ace inhibitors; Augmentin [amoxicillin-pot clavulanate]; Ciprofloxacin; Levaquin [levofloxacin in d5w]; Zocor [simvastatin]; Acrylic polymer [carbomer]; Chocolate; and Gabapentin   Review of Systems Review of Systems  All other systems reviewed and are negative.    Physical Exam Updated Vital Signs BP 123/74   Pulse 95   Temp (!) 97.4 F (36.3 C)   Resp 15   SpO2 100%   Physical Exam Vitals signs and nursing note reviewed.  Constitutional:      General: She is not in acute distress.    Appearance: She is well-developed.  HENT:     Head: Normocephalic and atraumatic.  Neck:     Musculoskeletal: Normal range of motion.  Cardiovascular:     Rate and Rhythm: Normal rate and regular  rhythm.     Heart sounds: Normal heart sounds.  Pulmonary:     Effort: Pulmonary effort is normal.     Breath sounds: Wheezing present.  Abdominal:     General: There is no distension.     Palpations: Abdomen is soft.     Tenderness: There is no abdominal tenderness.  Musculoskeletal: Normal range of motion.  Skin:    General: Skin is warm and dry.  Neurological:     Mental Status: She is alert and oriented to person, place, and time.  Psychiatric:        Judgment: Judgment normal.      ED Treatments / Results  Labs (all labs ordered are listed, but only abnormal results are displayed) Labs Reviewed  COMPREHENSIVE METABOLIC PANEL - Abnormal; Notable for the following components:      Result Value   Glucose, Bld 107 (*)    BUN 31 (*)    GFR calc non Af Amer 55 (*)    All other components within normal limits  BRAIN NATRIURETIC PEPTIDE - Abnormal; Notable for the following components:   B Natriuretic Peptide 105.3 (*)    All other components within normal limits  CBC WITH DIFFERENTIAL/PLATELET  TROPONIN I    EKG EKG Interpretation  Date/Time:  Monday May 29 2018 11:05:40 EST Ventricular Rate:  83 PR Interval:    QRS Duration: 83 QT Interval:  382 QTC Calculation: 449 R Axis:   55 Text Interpretation:  Atrial fibrillation inferior lateral T wave inversions and nonspecific ST changes No significant change was found as compared to prior ecg Confirmed by Jola Schmidt [phone]) on 05/29/2018 11:07:26 AM   Radiology Dg Chest 2 View  Result Date: 05/29/2018 CLINICAL DATA:  Shortness of breath EXAM: CHEST - 2 VIEW COMPARISON:  05/16/2018 FINDINGS: There is no focal parenchymal opacity. There is no pleural effusion or pneumothorax. There is stable cardiomegaly. There is a dual lead cardiac pacemaker. The osseous structures are unremarkable. IMPRESSION: No active cardiopulmonary disease. Electronically Signed   By: Kathreen Devoid   On: 05/29/2018 12:18     Procedures Procedures (including critical care time)  Medications Ordered in ED Medications  albuterol (PROVENTIL HFA;VENTOLIN HFA) 108 (90 Base) MCG/ACT inhaler 2 puff (2 puffs Inhalation Given 05/29/18 1337)  sodium chloride 0.9 % bolus 250 mL (0 mLs Intravenous Stopped 05/29/18 1230)  albuterol (PROVENTIL) (2.5 MG/3ML)  0.083% nebulizer solution 5 mg (5 mg Nebulization Given 05/29/18 1125)  predniSONE (DELTASONE) tablet 60 mg (60 mg Oral Given 05/29/18 1336)     Initial Impression / Assessment and Plan / ED Course  I have reviewed the triage vital signs and the nursing notes.  Pertinent labs & imaging results that were available during my care of the patient were reviewed by me and considered in my medical decision making (see chart for details).     Patient feels much better after bronchodilators here in the emergency department.  Chest x-ray demonstrates no acute abnormality.  Cough improved after bronchodilators as well.  Patient be treated as mild COPD exacerbation.  Home with bronchodilators and prednisone.  Will withhold antibiotics at this time given her recent completion of a day course of antibiotics.  Primary care follow-up.  She will need referral to pulmonary as well.  This is been provided.  Patient understands to return the emergency department for new or worsening symptoms  Final Clinical Impressions(s) / ED Diagnoses   Final diagnoses:  Acute bronchitis with bronchospasm    ED Discharge Orders         Ordered    predniSONE (DELTASONE) 20 MG tablet  Daily     05/29/18 1413           Jola Schmidt, MD 05/29/18 1420

## 2018-05-29 NOTE — Discharge Instructions (Addendum)
Please call the pulmonologist for follow-up.  Purchase over-the-counter available Mucinex and use as directed by the pharmacist or on the back of the box

## 2018-05-31 ENCOUNTER — Ambulatory Visit (INDEPENDENT_AMBULATORY_CARE_PROVIDER_SITE_OTHER): Payer: Medicare Other | Admitting: Pulmonary Disease

## 2018-05-31 ENCOUNTER — Encounter: Payer: Self-pay | Admitting: Pulmonary Disease

## 2018-05-31 VITALS — BP 136/70 | HR 70 | Ht 63.5 in | Wt 146.2 lb

## 2018-05-31 DIAGNOSIS — J209 Acute bronchitis, unspecified: Secondary | ICD-10-CM

## 2018-05-31 DIAGNOSIS — J208 Acute bronchitis due to other specified organisms: Secondary | ICD-10-CM | POA: Diagnosis not present

## 2018-05-31 DIAGNOSIS — F419 Anxiety disorder, unspecified: Secondary | ICD-10-CM | POA: Diagnosis not present

## 2018-05-31 DIAGNOSIS — R0602 Shortness of breath: Secondary | ICD-10-CM

## 2018-05-31 NOTE — Progress Notes (Signed)
Subjective:    [name], female    DOB: [birth year], 85 y.o.   MRN: [id number]  HPI  Chief Complaint  Patient presents with  . Pulm Consult    Referred by Dr at ED for SOB. Was diagnosed with PNA and then bronchitis. Then was told it was related to panic attacks. Continues to cough.      85 year old never smoker presents for evaluation of dyspnea. She had an emergency room visit on 04/17/2018 for aphasia and underwent extensive evaluation, head CT was negative and she was finally diagnosed as a TIA by discharge. She developed chest tightness and cough productive of foamy white sputum and was seen in her PCP office in 1/14.  Chest x-ray showed hyperinflation without any infiltrates.  She was diagnosed with "COPD" and given doxycycline and prednisone for bronchitis and also a sample of Anoro. She was in the emergency room again on 05/29/2018 this time for sudden onset shortness of breath.  Wheezing was noted on her lung exam.  I have reviewed the ED record.  Labs showed negative troponin and low BNP.  Chest x-ray again did not show any infiltrates.  She was told that she may have had muscle spasm leading to a panic attack.  She was given bronchodilators and felt better after an albuterol neb she was also given a prednisone taper, Proventil MDI prescription and a cough syrup.  She admits to her nerves being really bad in the past few weeks this appears to be related to familial stressors and to her older sister age 14 being put in assisted living.  She is accompanied by her grand niece today.  She takes Xanax about 3 times a week although has been prescribed 1 mg she only takes half tablet and this seems to help her anxiety.  Past medical history atrial fibrillation status post PPM and hypertension  She denies preceding URI symptoms or fevers.  There is no history of leg pains or chest pain     Significant tests/ events reviewed  Spirometry does not show any evidence of  airway obstruction, ratio 81, FEV1 77% FVC 70%  Past Medical History:  Diagnosis Date  . Atrial fib/flutter, transient   . Atrial fibrillation, chronic   . CHF (congestive heart failure) ([place]) 10/29/2009   Echo - EF >55%; normal LV size and systolic function; unable to assess diastolic fcn due to E/A fusion, pulmonary vein flow pattern suggests elevated filling pressure; marked biatrail dilation, mild/mod tricuspid regurgitation; mod pulmonary htn; mild/mod mitral regurgitation; although echocardiographic features are incomplete findings suggest possible infiltrative cardiomyopathy (maybe amyloidosi  . Coronary artery disease 03/19/2002   R/P Cardiolite - EF 76%; nromal static and dynamic myocardial perfusion images; normal wall motion and endocardial thickening in all vascular territories  . Facial numbness 12/26/2008   carotid doppler - R and L ICAs 0-49% diameter reduction (velocities suggest low end of scale)  . Hypertension   . Pacemaker   . Peripheral neuropathy   . Skin cancer    s/p surgical removal.  . TIA (transient ischemic attack)    Past Surgical History:  Procedure Laterality Date  . ABDOMINAL HYSTERECTOMY    . APPENDECTOMY    . CARDIAC CATHETERIZATION  08/06/2005   minimal coronary disease predominant RCA; no significant atherosclerosis; new onset sick sinus syndrome and atrial flutter w/ ventricular response, controlled on med therapy; systemic HTN, normal renal arteries  . CARDIOVERSION  11/19/2009   successful DCCV from AF to sinus  type rhythm  . CHOLECYSTECTOMY    . Skin cancer resection      Allergies  Allergen Reactions  . Latex Itching  . Ace Inhibitors Other (See Comments)    Unknown reaction  . Augmentin [Amoxicillin-Pot Clavulanate] Other (See Comments)  . Ciprofloxacin Other (See Comments)  . Levaquin [Levofloxacin In D5w] Other (See Comments)  . Zocor [Simvastatin] Other (See Comments)  . Acrylic Polymer [Carbomer] Itching  . Chocolate Other (See  Comments)    migraine's   . Gabapentin Other (See Comments)    Unsteady gait     Social History   Socioeconomic History  . Marital status: Married    Spouse name: Not on file  . Number of children: 0  . Years of education: Not on file  . Highest education level: Not on file  Occupational History  . Not on file  Social Needs  . Financial resource strain: Not on file  . Food insecurity:    Worry: Not on file    Inability: Not on file  . Transportation needs:    Medical: Not on file    Non-medical: Not on file  Tobacco Use  . Smoking status: Never Smoker  . Smokeless tobacco: Never Used  Substance and Sexual Activity  . Alcohol use: No  . Drug use: No  . Sexual activity: Never  Lifestyle  . Physical activity:    Days per week: Not on file    Minutes per session: Not on file  . Stress: Not on file  Relationships  . Social connections:    Talks on phone: Not on file    Gets together: Not on file    Attends religious service: Not on file    Active member of club or organization: Not on file    Attends meetings of clubs or organizations: Not on file    Relationship status: Not on file  . Intimate partner violence:    Fear of current or ex partner: Not on file    Emotionally abused: Not on file    Physically abused: Not on file    Forced sexual activity: Not on file  Other Topics Concern  . Not on file  Social History Narrative   Widowed.  Lives alone.  Ambulates independently.     Family History  Problem Relation Age of Onset  . Heart disease Mother   . Diabetes Mother   . Heart attack Father   . Heart disease Father   . Cirrhosis Brother      Review of Systems  Constitutional: Negative for fever and unexpected weight change.  HENT: Positive for congestion. Negative for dental problem, ear pain, nosebleeds, postnasal drip, rhinorrhea, sinus pressure, sneezing, sore throat and trouble swallowing.   Eyes: Negative for redness and itching.  Respiratory:  Positive for cough and shortness of breath. Negative for chest tightness and wheezing.   Cardiovascular: Negative for palpitations and leg swelling.  Gastrointestinal: Negative for nausea and vomiting.  Genitourinary: Negative for dysuria.  Musculoskeletal: Negative for joint swelling.  Skin: Negative for rash.  Allergic/Immunologic: Negative.  Negative for environmental allergies, food allergies and immunocompromised state.  Neurological: Negative for headaches.  Hematological: Does not bruise/bleed easily.  Psychiatric/Behavioral: Negative for dysphoric mood. The patient is nervous/anxious.        Objective:   Physical Exam  Gen. Pleasant, elderly,well-nourished, in no distress, anxious affect ENT - no pallor,icterus, no post nasal drip Neck: No JVD, no thyromegaly, no carotid bruits Lungs: no use of accessory  muscles, no dullness to percussion, clear without rales or rhonchi  Cardiovascular: Rhythm regular, heart sounds  normal, no murmurs or gallops, no peripheral edema Abdomen: soft and non-tender, no hepatosplenomegaly, BS normal. Musculoskeletal: No deformities, no cyanosis or clubbing Neuro:  alert, non focal       Assessment & Plan:

## 2018-05-31 NOTE — Assessment & Plan Note (Addendum)
may have had acute bronchitis, this is gotten better. Your second emergency room visit was likely a panic attack While she has hyperinflation on her chest x-ray, does not have COPD, is a lifelong never smoker-and spirometry does not show any evidence of airway obstruction  Okay to use albuterol only if you have wheezing in your chest Okay to stop Anoro -in fact bronchodilator may have made anxiety worse

## 2018-05-31 NOTE — Patient Instructions (Addendum)
You may have had acute bronchitis, this is gotten better. Your second emergency room visit was likely a panic attack Lung function appears okay  Complete course of prednisone Okay to use albuterol only if you have wheezing in your chest  Use Xanax half tablet every night for the next week

## 2018-05-31 NOTE — Assessment & Plan Note (Signed)
Use Xanax half tablet every night for the next week -then can go back to use as needed Familial stressors ongoing and she needs to learn to cope with these

## 2018-06-01 ENCOUNTER — Ambulatory Visit: Payer: Self-pay | Admitting: Internal Medicine

## 2018-06-01 ENCOUNTER — Other Ambulatory Visit: Payer: Self-pay | Admitting: Internal Medicine

## 2018-06-01 DIAGNOSIS — F419 Anxiety disorder, unspecified: Secondary | ICD-10-CM

## 2018-06-02 ENCOUNTER — Ambulatory Visit (INDEPENDENT_AMBULATORY_CARE_PROVIDER_SITE_OTHER): Payer: Medicare Other | Admitting: Pharmacist

## 2018-06-02 DIAGNOSIS — Z7901 Long term (current) use of anticoagulants: Secondary | ICD-10-CM

## 2018-06-02 DIAGNOSIS — I482 Chronic atrial fibrillation, unspecified: Secondary | ICD-10-CM | POA: Diagnosis not present

## 2018-06-02 LAB — POCT INR: INR: 2.5 (ref 2.0–3.0)

## 2018-06-04 NOTE — Progress Notes (Signed)
NO SHOW

## 2018-06-05 ENCOUNTER — Ambulatory Visit (INDEPENDENT_AMBULATORY_CARE_PROVIDER_SITE_OTHER): Payer: Medicare Other | Admitting: Internal Medicine

## 2018-06-05 ENCOUNTER — Encounter: Payer: Self-pay | Admitting: Internal Medicine

## 2018-06-05 VITALS — BP 128/68 | HR 80 | Temp 97.5°F | Ht 63.5 in | Wt 146.2 lb

## 2018-06-05 DIAGNOSIS — G4700 Insomnia, unspecified: Secondary | ICD-10-CM

## 2018-06-05 DIAGNOSIS — J42 Unspecified chronic bronchitis: Secondary | ICD-10-CM

## 2018-06-05 DIAGNOSIS — F419 Anxiety disorder, unspecified: Secondary | ICD-10-CM

## 2018-06-05 MED ORDER — TRAZODONE HCL 150 MG PO TABS: ORAL_TABLET | ORAL | 1 refills | Status: DC

## 2018-06-05 MED ORDER — ESCITALOPRAM OXALATE 10 MG PO TABS: ORAL_TABLET | ORAL | 1 refills | Status: DC

## 2018-06-05 NOTE — Progress Notes (Signed)
Subjective:    [name], female    DOB: 1927-11-20, 85 y.o.   MRN: [id number]  HPI    Patient is a very nice 85 yo [place] who recently was seen 12/23  for Bronchitis and the  Then on 1/27,  she was apparently presented to the ER felt to be having a panic attack. Then on  1/29 , she was seen by Dr Elsworth Soho who obtained Nl Spirometry and encouraged her to take Xanax. She presents today some what anxious & tearful re: family conflict.   Medication Sig  . ALPRAZolam (XANAX) 1 MG tablet TAKE 1/2 TO 1 TABLET BY MOUTH EVERY DAY TO TWICE DAILY.  Marland Kitchen BIOTIN PO Take 10,000 mcg by mouth daily.   Marland Kitchen VITAMIN D3 3000 units  Take 3,000 tablets by mouth daily.   Marland Kitchen VITAMIN B 12 tab Take 1,000 mcg by mouth daily.   Marland Kitchen diltiazem  180 MG 24 hr caps Take 1 capsule (180 mg total) by mouth daily.  Marland Kitchen ELDERBERRY  Take 5 mLs by mouth daily as needed (cough).  . furosemide  40 MG tablet Take 1 tablet (40 mg total) by mouth daily.  . IRON  Take 65 mg by mouth daily.   Marland Kitchen MAGNESIUM  Take 1 tablet by mouth daily.  Marland Kitchen NITROSTAT 0.4 MG SL tab as needed for chest pain.  Marland Kitchen K-DUR 10 MEQ tab Take 1 tablet  daily.  Marland Kitchen K-DUR, 20 MEQ tablet Take 20 mEq h daily.  Marland Kitchen telmisartan  80 MG tablet Patient takingTake 40 mg daily  . COUMADIN 3 MG tablet Patient taking  3-4.5 mg b daily  . escitalopram  5 MG tablet Take 1 tablet  daily.  . traZODone  150 MG tablet Take 0.5 tablets  at bedtime.  Marland Kitchen ELDERBERRY Take 1 lozenge by mouth every 2 (two) hours as needed (cough).  Asencion Islam nasal spray Place 2 sprays into the nose daily as needed for allergies.  . Multiple Vitamins-Minerals ZINC  Take 1 tablet by mouth daily.  . pravastatin  20 MG tablet Take 1 tablet (20 mg total) by mouth daily at 6 PM.  . ANORO ELLIPTA 62.5-25  Inhale 1 puff into the lungs daily   Allergies  Allergen Reactions  . Latex Itching  . Ace Inhibitors Other (See Comments)    Unknown reaction  . Augmentin [Amoxicillin-Pot Clavulanate] Other (See Comments)  .  Ciprofloxacin Other (See Comments)  . Levaquin [Levofloxacin In D5w] Other (See Comments)  . Zocor [Simvastatin] Other (See Comments)  . Acrylic Polymer [Carbomer] Itching  . Chocolate Other (See Comments)    migraine's   . Gabapentin Other (See Comments)    Unsteady gait    Past Medical History:  Diagnosis Date  . Atrial fib/flutter, transient   . Atrial fibrillation, chronic   . CHF (congestive heart failure) ([place]) 10/29/2009   Echo - EF >55%; normal LV size and systolic function; unable to assess diastolic fcn due to E/A fusion, pulmonary vein flow pattern suggests elevated filling pressure; marked biatrail dilation, mild/mod tricuspid regurgitation; mod pulmonary htn; mild/mod mitral regurgitation; although echocardiographic features are incomplete findings suggest possible infiltrative cardiomyopathy (maybe amyloidosi  . Coronary artery disease 03/19/2002   R/P Cardiolite - EF 76%; nromal static and dynamic myocardial perfusion images; normal wall motion and endocardial thickening in all vascular territories  . Facial numbness 12/26/2008   carotid doppler - R and L ICAs 0-49% diameter reduction (velocities suggest low end of scale)  .  Hypertension   . Pacemaker   . Peripheral neuropathy   . Skin cancer    s/p surgical removal.  . TIA (transient ischemic attack)    Past Surgical History:  Procedure Laterality Date  . ABDOMINAL HYSTERECTOMY    . APPENDECTOMY    . CARDIAC CATHETERIZATION  08/06/2005   minimal coronary disease predominant RCA; no significant atherosclerosis; new onset sick sinus syndrome and atrial flutter w/ ventricular response, controlled on med therapy; systemic HTN, normal renal arteries  . CARDIOVERSION  11/19/2009   successful DCCV from AF to sinus type rhythm  . CHOLECYSTECTOMY    . Skin cancer resection     Review of Systems   negative except anxiety    Objective:   Physical Exam  BP 128/68   Pulse 80   Temp (!) 97.5 F (36.4 C)   Ht 5' 3.5" (1.613  m)   Wt 146 lb 3.2 oz (66.3 kg)   SpO2 95%   BMI 25.49 kg/m   HEENT - WNL. Neck - supple.  Chest - Clear equal BS. Cor - Nl HS. RRR w/o sig MGR. PP 1(+). No edema. MS- FROM w/o deformities.  Gait Nl. Neuro -  Nl w/o focal abnormalities.    Assessment & Plan:   1. Chronic bronchitis ([place])   2. Chronic anxiety  - escitalopram (LEXAPRO) 10 MG tablet; Take 1 tablet daily for Chronic Anxiety (Nerves)  Dispense: 90 tablet; Refill: 1  3. Insomnia, unspecified type  - traZODone (DESYREL) 150 MG tablet; Take 1 tablet 1 hour before Bedtime  Dispense: 90 tablet; Refill: 1  4. Acute anxiety  - escitalopram (LEXAPRO) 10 MG tablet; Take 1 tablet daily for Chronic Anxiety (Nerves)  Dispense: 90 tablet; Refill: 1 - traZODone (DESYREL) 150 MG tablet; Take 1 tablet 1 hour before Bedtime  Dispense: 90 tablet; Refill: 1

## 2018-06-05 NOTE — Patient Instructions (Signed)

## 2018-06-16 ENCOUNTER — Ambulatory Visit (INDEPENDENT_AMBULATORY_CARE_PROVIDER_SITE_OTHER): Payer: Medicare Other | Admitting: *Deleted

## 2018-06-16 DIAGNOSIS — I482 Chronic atrial fibrillation, unspecified: Secondary | ICD-10-CM | POA: Diagnosis not present

## 2018-06-16 DIAGNOSIS — Z7901 Long term (current) use of anticoagulants: Secondary | ICD-10-CM

## 2018-06-16 LAB — POCT INR: INR: 1.8 — AB (ref 2.0–3.0)

## 2018-06-16 NOTE — Patient Instructions (Addendum)
Description   Today take 2 tablets, then Continue with 1.5 tablets daily except 1 tablet each Sunday, Tuesday and Thursday.  Repeat INR in 10 days.

## 2018-06-22 MED FILL — Sodium Chloride IV Soln 0.9%: INTRAVENOUS | Qty: 250 | Status: AC

## 2018-06-26 ENCOUNTER — Ambulatory Visit (INDEPENDENT_AMBULATORY_CARE_PROVIDER_SITE_OTHER): Payer: Medicare Other | Admitting: *Deleted

## 2018-06-26 DIAGNOSIS — Z7901 Long term (current) use of anticoagulants: Secondary | ICD-10-CM | POA: Diagnosis not present

## 2018-06-26 DIAGNOSIS — I482 Chronic atrial fibrillation, unspecified: Secondary | ICD-10-CM | POA: Diagnosis not present

## 2018-06-26 LAB — POCT INR: INR: 2.1 (ref 2.0–3.0)

## 2018-06-26 NOTE — Patient Instructions (Signed)
Description   Continue with 1.5 tablets daily except 1 tablet each Sunday, Tuesday and Thursday.  Repeat INR in 3 weeks.

## 2018-07-03 ENCOUNTER — Other Ambulatory Visit: Payer: Self-pay | Admitting: Internal Medicine

## 2018-07-03 DIAGNOSIS — I10 Essential (primary) hypertension: Secondary | ICD-10-CM

## 2018-07-18 ENCOUNTER — Ambulatory Visit (INDEPENDENT_AMBULATORY_CARE_PROVIDER_SITE_OTHER): Payer: Medicare Other | Admitting: Pharmacist Clinician (PhC)/ Clinical Pharmacy Specialist

## 2018-07-18 ENCOUNTER — Other Ambulatory Visit: Payer: Self-pay

## 2018-07-18 DIAGNOSIS — I482 Chronic atrial fibrillation, unspecified: Secondary | ICD-10-CM

## 2018-07-18 DIAGNOSIS — Z7901 Long term (current) use of anticoagulants: Secondary | ICD-10-CM | POA: Diagnosis not present

## 2018-07-18 LAB — POCT INR: INR: 1.6 — AB (ref 2.0–3.0)

## 2018-07-19 ENCOUNTER — Ambulatory Visit (INDEPENDENT_AMBULATORY_CARE_PROVIDER_SITE_OTHER): Payer: Medicare Other | Admitting: *Deleted

## 2018-07-19 DIAGNOSIS — I482 Chronic atrial fibrillation, unspecified: Secondary | ICD-10-CM

## 2018-07-19 DIAGNOSIS — Z950 Presence of cardiac pacemaker: Secondary | ICD-10-CM

## 2018-07-20 ENCOUNTER — Telehealth: Payer: Self-pay

## 2018-07-20 LAB — CUP PACEART REMOTE DEVICE CHECK
Battery Impedance: 1940 Ohm
Battery Voltage: 2.76 V
Brady Statistic RV Percent Paced: 65 %
Date Time Interrogation Session: 20200319115851
Implantable Lead Implant Date: 20110428
Implantable Lead Implant Date: 20110428
Implantable Lead Location: 753859
Implantable Lead Location: 753860
Implantable Lead Model: 4092
Implantable Lead Model: 4592
Implantable Pulse Generator Implant Date: 20110428
Lead Channel Impedance Value: 67 Ohm
Lead Channel Impedance Value: 741 Ohm
Lead Channel Pacing Threshold Amplitude: 0.5 V
Lead Channel Pacing Threshold Pulse Width: 0.4 ms
Lead Channel Setting Pacing Amplitude: 2.5 V
Lead Channel Setting Pacing Pulse Width: 0.4 ms
Lead Channel Setting Sensing Sensitivity: 5.6 mV
MDC IDC MSMT BATTERY REMAINING LONGEVITY: 35 mo

## 2018-07-20 NOTE — Telephone Encounter (Signed)
Left message for patient to remind of missed remote transmission.  

## 2018-07-27 ENCOUNTER — Encounter: Payer: Self-pay | Admitting: Cardiology

## 2018-07-27 NOTE — Progress Notes (Signed)
Remote pacemaker transmission.   

## 2018-08-03 ENCOUNTER — Telehealth: Payer: Self-pay | Admitting: Pharmacist Clinician (PhC)/ Clinical Pharmacy Specialist

## 2018-08-03 NOTE — Telephone Encounter (Signed)
New Message    Pt is calling about upcoming coumadin appt   Please call

## 2018-08-03 NOTE — Telephone Encounter (Signed)
Explained drive thru service - patient agreeable.  1. Do you currently have a fever? no (yes = cancel and refer to pcp for e-visit) 2. Have you recently travelled on a cruise, internationally, or to [place], [place], [place], [place], [place], or [place], [place] [employer]) ? no (yes = cancel, stay home, monitor symptoms, and contact pcp or initiate e-visit if symptoms develop) 3. Have you been in contact with someone that is currently pending confirmation of Covid19 testing or has been confirmed to have the [place] virus?  no (yes = cancel, stay home, away from tested individual, monitor symptoms, and contact pcp or initiate e-visit if symptoms develop) 4. Are you currently experiencing fatigue or cough? no (yes = pt should be prepared to have a mask placed at the time of their visit).  Pt. Advised that we are restricting visitors at this time and anyone present in the vehicle should meet the above criteria as well. Advised that visit will be at curbside for finger stick ONLY and will receive call with instructions. Pt also advised to please bring own pen for signature of arrival document.   Patient aware screening is > 48 hrs prior to appointment.  Understands that if any of her answers change or she feels unwell, she should cancel appointment or just not show up.

## 2018-08-07 ENCOUNTER — Ambulatory Visit (INDEPENDENT_AMBULATORY_CARE_PROVIDER_SITE_OTHER): Payer: Medicare Other | Admitting: *Deleted

## 2018-08-07 ENCOUNTER — Other Ambulatory Visit: Payer: Self-pay

## 2018-08-07 DIAGNOSIS — I482 Chronic atrial fibrillation, unspecified: Secondary | ICD-10-CM

## 2018-08-07 DIAGNOSIS — Z7901 Long term (current) use of anticoagulants: Secondary | ICD-10-CM | POA: Diagnosis not present

## 2018-08-07 LAB — POCT INR: INR: 2 (ref 2.0–3.0)

## 2018-08-07 NOTE — Patient Instructions (Signed)
Description   Spoke with pt and instructed pt to continue with 1.5 tablets daily except 1 tablet each Sunday, Tuesday and Thursday.  Repeat INR in 4 weeks.

## 2018-08-29 DIAGNOSIS — R7309 Other abnormal glucose: Secondary | ICD-10-CM | POA: Insufficient documentation

## 2018-08-29 NOTE — Progress Notes (Signed)
THIS ENCOUNTER IS A VIRTUAL VISIT DUE TO COVID-19 - PATIENT WAS NOT SEEN IN THE OFFICE.  PATIENT HAS CONSENTED TO VIRTUAL VISIT / TELEMEDICINE VISIT  This provider placed a call to [employer] using telephone, her appointment was changed to a virtual office visit to reduce the risk of exposure to the COVID-19 virus and to help Misty Blackwell remain healthy and safe. The virtual visit will also provide continuity of care. She verbalizes understanding.   Virtual Visit via telephone Note  I connected with the patient  on 08/30/18  by telephone.  I verified that I am speaking with the correct person using two identifiers.        I discussed the limitations of evaluation and management by telemedicine and the availability of in person appointments. The patient expressed understanding and agreed to proceed.  History of Present Illness:      This very nice 85 y.o. [name] presents for 6 month follow up with HTN, ASHD/cAfib, HLD, Pre-Diabetes and Vitamin D Deficiency.  Patient has GERD controlled with prudent diet.     Patient is treated for HTN (1998) & BP has been controlled at home. Today's BP is at goal at 139/54.  Heart caths were Negative in 1993 and 2007. Myoviews were Negative in 1993 and 2019. In 2006, she had an embolic CVA w/Rt HP which resolved. She was dx'd with Afib in 2006 and has been on Coumadin since followed at [facility]. Dr Sallyanne Kuster implanted a PPM for SSS in 2007. Patient has had no complaints of any cardiac type chest pain, palpitations, dyspnea / orthopnea / PND, dizziness, claudication, or dependent edema.      Hyperlipidemia is not controlled with diet & meds. Patient denies myalgias or other med SE's. Last Lipids were not at goal and not treated aggressively for age consideration:  Lab Results  Component Value Date   CHOL 167 02/22/2018   HDL 41 (L) 02/22/2018   LDLCALC 105 (H) 02/22/2018   TRIG 118 02/22/2018   CHOLHDL 4.1 02/22/2018       Also, the  patient has history of PreDiabetes & Insulin Resistance (A1c 5.9% / Insulin 86 /2011)  and has had no symptoms of reactive hypoglycemia, diabetic polys, paresthesias or visual blurring.  Last A1c was not at goal: Lab Results  Component Value Date   HGBA1C 5.7 (H) 02/22/2018      Further, the patient also has history of Vitamin D Deficiency ("13" / 2008) and supplements vitamin D without any suspected side-effects. Last vitamin D was sl low (goal 70-100): Lab Results  Component Value Date   VD25OH 58 02/22/2018   Current Outpatient Medications on File Prior to Visit  Medication Sig  . B Complex-C (SUPER B COMPLEX PO) Take 1 tablet by mouth daily.  Marland Kitchen BIOTIN PO Take 10,000 mcg by mouth daily.   . Cholecalciferol (VITAMIN D3) 3000 units TABS Take 3,000 tablets by mouth daily.   Marland Kitchen diltiazem (TIAZAC) 180 MG 24 hr capsule Take 1 capsule (180 mg total) by mouth daily.  . furosemide (LASIX) 40 MG tablet Take 1 tablet (40 mg total) by mouth daily.  . IRON PO Take 65 mg by mouth daily.   Marland Kitchen MAGNESIUM PO Take 1 tablet by mouth daily.  . Multiple Vitamins-Minerals (ZINC PO) Take 1 tablet by mouth daily.  . nitroGLYCERIN (NITROSTAT) 0.4 MG SL tablet Place 0.4 mg under the tongue every 5 (five) minutes as needed for chest pain.  . potassium  chloride SA (K-DUR,KLOR-CON) 20 MEQ tablet Take 20 mEq by mouth 2 (two) times daily.   . pravastatin (PRAVACHOL) 20 MG tablet Take 1 tablet (20 mg total) by mouth daily at 6 PM.  . telmisartan (MICARDIS) 80 MG tablet TAKE ONE-HALF TO 1 TABLET BY MOUTH EVERY MORNING FOR BLOOD PRESSURE AND HEART  . traZODone (DESYREL) 150 MG tablet Take 1 tablet 1 hour before Bedtime  . warfarin (COUMADIN) 3 MG tablet TAKE 1 TO 1 AND 1/2 TABLET BY MOUTH TABLETS AS DIRECTED (Patient taking differently: Take 3-4.5 mg by mouth daily. )  . Cyanocobalamin (VITAMIN B 12 PO) Take 1,000 mcg by mouth daily.    No current facility-administered medications on file prior to visit.    Allergies   Allergen Reactions  . Latex Itching  . Ace Inhibitors Other (See Comments)    Unknown reaction  . Augmentin [Amoxicillin-Pot Clavulanate] Other (See Comments)  . Ciprofloxacin Other (See Comments)  . Levaquin [Levofloxacin In D5w] Other (See Comments)  . Zocor [Simvastatin] Other (See Comments)  . Acrylic Polymer [Carbomer] Itching  . Chocolate Other (See Comments)    migraine's   . Gabapentin Other (See Comments)    Unsteady gait    PMHx:   Past Medical History:  Diagnosis Date  . Atrial fib/flutter, transient   . Atrial fibrillation, chronic   . CHF (congestive heart failure) ([place]) 10/29/2009   Echo - EF >55%; normal LV size and systolic function; unable to assess diastolic fcn due to E/A fusion, pulmonary vein flow pattern suggests elevated filling pressure; marked biatrail dilation, mild/mod tricuspid regurgitation; mod pulmonary htn; mild/mod mitral regurgitation; although echocardiographic features are incomplete findings suggest possible infiltrative cardiomyopathy (maybe amyloidosi  . Coronary artery disease 03/19/2002   R/P Cardiolite - EF 76%; nromal static and dynamic myocardial perfusion images; normal wall motion and endocardial thickening in all vascular territories  . Facial numbness 12/26/2008   carotid doppler - R and L ICAs 0-49% diameter reduction (velocities suggest low end of scale)  . Hypertension   . Pacemaker   . Peripheral neuropathy   . Skin cancer    s/p surgical removal.  . TIA (transient ischemic attack)    Immunization History  Administered Date(s) Administered  . DT 03/19/2015  . Influenza Split 05/04/2011  . Influenza, High Dose Seasonal PF 03/19/2014, 12/23/2015  . Influenza,inj,quad, With Preservative 05/21/2013  . Influenza-Unspecified 02/04/2015, 02/03/2017  . Pneumococcal Conjugate-13 03/19/2014  . Pneumococcal Polysaccharide-23 05/04/2011  . Pneumococcal-Unspecified 05/03/2001  . Td 05/04/2003   Past Surgical History:  Procedure  Laterality Date  . ABDOMINAL HYSTERECTOMY    . APPENDECTOMY    . CARDIAC CATHETERIZATION  08/06/2005   minimal coronary disease predominant RCA; no significant atherosclerosis; new onset sick sinus syndrome and atrial flutter w/ ventricular response, controlled on med therapy; systemic HTN, normal renal arteries  . CARDIOVERSION  11/19/2009   successful DCCV from AF to sinus type rhythm  . CHOLECYSTECTOMY    . Skin cancer resection     FHx:    Reviewed / unchanged  SHx:    Reviewed / unchanged   Systems Review:  Constitutional: Denies fever, chills, wt changes, headaches, insomnia, fatigue, night sweats, change in appetite. Eyes: Denies redness, blurred vision, diplopia, discharge, itchy, watery eyes.  ENT: Denies discharge, congestion, post nasal drip, epistaxis, sore throat, earache, hearing loss, dental pain, tinnitus, vertigo, sinus pain, snoring.  CV: Denies chest pain, palpitations, irregular heartbeat, syncope, dyspnea, diaphoresis, orthopnea, PND, claudication or edema. Respiratory: denies cough, dyspnea,  DOE, pleurisy, hoarseness, laryngitis, wheezing.  Gastrointestinal: Denies dysphagia, odynophagia, heartburn, reflux, water brash, abdominal pain or cramps, nausea, vomiting, bloating, diarrhea, constipation, hematemesis, melena, hematochezia  or hemorrhoids. Genitourinary: Denies dysuria, frequency, urgency, nocturia, hesitancy, discharge, hematuria or flank pain. Musculoskeletal: Denies arthralgias, myalgias, stiffness, jt. swelling, pain, limping or strain/sprain.  Skin: Denies pruritus, rash, hives, warts, acne, eczema or change in skin lesion(s). Neuro: No weakness, tremor, incoordination, spasms, paresthesia or pain. Psychiatric: Denies confusion, memory loss or sensory loss. Endo: Denies change in weight, skin or hair change.  Heme/Lymph: No excessive bleeding, bruising or enlarged lymph nodes.  Physical Exam  BP (!) 134/59   Pulse 87   General : Well sounding patient  in no apparent distress HEENT: no hoarseness, no cough for duration of visit Lungs: speaks in complete sentences, no audible wheezing, no apparent distress Neurological: alert, oriented x 3 Psychiatric: pleasant, judgement appropriate   Assessment and Plan:  1. Essential hypertension  - Continue medication, monitor blood pressure at home.  - Continue DASH diet.  Reminder to go to the ER if any CP,  SOB, nausea, dizziness, severe HA, changes vision/speech.  - CBC with Differential/Platelet; Future - COMPLETE METABOLIC PANEL WITH GFR; Future - Magnesium; Future - TSH; Future  2. Hyperlipidemia, mixed  - Continue diet/meds, exercise,& lifestyle modifications.  - Continue monitor periodic cholesterol/liver & renal functions   - Lipid panel; Future - TSH; Future  3. Abnormal glucose  - Continue diet, exercise  - Lifestyle modifications.  - Monitor appropriate labs.  - Hemoglobin A1c; Future - Insulin, random; Future  4. Vitamin D deficiency  - Continue supplementation.  - VITAMIN D 25 Hydroxy (Vit-D Deficiency, Fractures); Future  5. Prediabetes  - Hemoglobin A1c; Future - Insulin, random; Future  6. Chronic atrial fibrillation  - TSH; Future  7. Pacemaker   8. Gastroesophageal reflux disease  - CBC with Differential/Platelet; Future  9. Medication management  - CBC with Differential/Platelet; Future - COMPLETE METABOLIC PANEL WITH GFR; Future - Magnesium; Future - Lipid panel; Future - TSH; Future - Hemoglobin A1c; Future - Insulin, random; Future - VITAMIN D 25 Hydroxy (Vit-D Deficiency, Fractures); Future       Discussed  regular exercise, BP monitoring, weight control to achieve/maintain BMI less than 25 and discussed med and SE's. Recommended labs to assess and monitor clinical status with further disposition pending results of labs. I discussed the assessment and treatment plan with the patient. The patient was provided an opportunity to ask  questions and all were answered. The patient agreed with the plan and demonstrated an understanding of the instructions. I provided 28 minutes of non-face-to-face time during this encounter and over  34 minutes of exam, counseling, chart review and  complex critical decision making was performed   Kirtland Bouchard, MD

## 2018-08-29 NOTE — Patient Instructions (Signed)
Coronavirus (COVID-19) Are you at risk?  Are you at risk for the Coronavirus (COVID-19)?  To be considered HIGH RISK for Coronavirus (COVID-19), you have to meet the following criteria:  . Traveled to [place], [place], [place], [place] or [place]; or in the [place] to [place], [place], [place]  . or [place]; and have fever, cough, and shortness of breath within the last 2 weeks of travel OR . Been in close contact with a person diagnosed with COVID-19 within the last 2 weeks and have  . fever, cough,and shortness of breath .  . IF YOU DO NOT MEET THESE CRITERIA, YOU ARE CONSIDERED LOW RISK FOR COVID-19.  What to do if you are HIGH RISK for COVID-19?  . If you are having a medical emergency, call 911. . Seek medical care right away. Before you go to a doctor's office, urgent care or emergency department, .  call ahead and tell them about your recent travel, contact with someone diagnosed with COVID-19  .  and your symptoms.  . You should receive instructions from your physician's office regarding next steps of care.  . When you arrive at healthcare provider, tell the healthcare staff immediately you have returned from  . visiting [place], [place], [place], [place] or [place]; or traveled in the [place] to [place], [place],  . [place] or [place] in the last two weeks or you have been in close contact with a person diagnosed with  . COVID-19 in the last 2 weeks.   . Tell the health care staff about your symptoms: fever, cough and shortness of breath. . After you have been seen by a medical provider, you will be either: o Tested for (COVID-19) and discharged home on quarantine except to seek medical care if  o symptoms worsen, and asked to  - Stay home and avoid contact with others until you get your results (4-5 days)  - Avoid travel on public transportation if possible (such as bus, train, or airplane) or o Sent to the Emergency Department by EMS for evaluation,  COVID-19 testing  and  o possible admission depending on your condition and test results.  What to do if you are LOW RISK for COVID-19?  Reduce your risk of any infection by using the same precautions used for avoiding the common cold or flu:  . Wash your hands often with soap and warm water for at least 20 seconds.  If soap and water are not readily available,  . use an alcohol-based hand sanitizer with at least 60% alcohol.  . If coughing or sneezing, cover your mouth and nose by coughing or sneezing into the elbow areas of your shirt or coat, .  into a tissue or into your sleeve (not your hands). . Avoid shaking hands with others and consider head nods or verbal greetings only. . Avoid touching your eyes, nose, or mouth with unwashed hands.  . Avoid close contact with people who are sick. . Avoid places or events with large numbers of people in one location, like concerts or sporting events. . Carefully consider travel plans you have or are making. . If you are planning any travel outside or inside the [place], visit the CDC's Travelers' Health webpage for the latest health notices. . If you have some symptoms but not all symptoms, continue to monitor at home and seek medical attention  . if your symptoms worsen. . If you are having a medical emergency, call 911.   ++++++++++++++++++++++++++++++++   Recommend Adult Low Dose Aspirin or  coated  Aspirin 81 mg daily  To reduce risk of Colon Cancer 20 %,  Skin Cancer 26 % ,  Melanoma 46%  and  Pancreatic cancer 60% ++++++++++++++++++++++++++++++++ Vitamin D goal  is between 70-100.  Please make sure that you are taking your Vitamin D as directed.  It is very important as a natural anti-inflammatory  helping hair, skin, and nails, as well as reducing stroke and heart attack risk.  It helps your bones and helps with mood. It also decreases numerous cancer risks so please take it as directed.  Low Vit D is associated with a 200-300% higher  risk for CANCER  and 200-300% higher risk for HEART   ATTACK  &  STROKE.   ...................................... It is also associated with higher death rate at younger ages,  autoimmune diseases like Rheumatoid arthritis, Lupus, Multiple Sclerosis.    Also many other serious conditions, like depression, Alzheimer's Dementia, infertility, muscle aches, fatigue, fibromyalgia - just to name a few. ++++++++++++++++++++ Recommend the book "The END of DIETING" by Dr Joel Fuhrman  & the book "The END of DIABETES " by Dr Joel Fuhrman At [employer] - get book & Audio CD's    Being diabetic has a  300% increased risk for heart attack, stroke, cancer, and alzheimer- type vascular dementia. It is very important that you work harder with diet by avoiding all foods that are white. Avoid white rice (brown & wild rice is OK), white potatoes (sweetpotatoes in moderation is OK), White bread or wheat bread or anything made out of white flour like bagels, donuts, rolls, buns, biscuits, cakes, pastries, cookies, pizza crust, and pasta (made from white flour & egg whites) - vegetarian pasta or spinach or wheat pasta is OK. Multigrain breads like Arnold's or Pepperidge [employer], or multigrain sandwich thins or flatbreads.  Diet, exercise and weight loss can reverse and cure diabetes in the early stages.  Diet, exercise and weight loss is very important in the control and prevention of complications of diabetes which affects every system in your body, ie. Brain - dementia/stroke, eyes - glaucoma/blindness, heart - heart attack/heart failure, kidneys - dialysis, stomach - gastric paralysis, intestines - malabsorption, nerves - severe painful neuritis, circulation - gangrene & loss of a leg(s), and finally cancer and Alzheimers.    I recommend avoid fried & greasy foods,  sweets/candy, white rice (brown or wild rice or Quinoa is OK), white potatoes (sweet potatoes are OK) - anything made from white flour - bagels, doughnuts,  rolls, buns, biscuits,white and wheat breads, pizza crust and traditional pasta made of white flour & egg white(vegetarian pasta or spinach or wheat pasta is OK).  Multi-grain bread is OK - like multi-grain flat bread or sandwich thins. Avoid alcohol in excess. Exercise is also important.    Eat all the vegetables you want - avoid meat, especially red meat and dairy - especially cheese.  Cheese is the most concentrated form of trans-fats which is the worst thing to clog up our arteries. Veggie cheese is OK which can be found in the fresh produce section at [employer] or [employer] or [employer]  +++++++++++++++++++++ DASH Eating Plan  DASH stands for "Dietary Approaches to Stop Hypertension."   The DASH eating plan is a healthy eating plan that has been shown to reduce high blood pressure (hypertension). Additional health benefits may include reducing the risk of type 2 diabetes mellitus, heart disease, and stroke. The DASH eating plan may   also help with weight loss. WHAT DO I NEED TO KNOW ABOUT THE DASH EATING PLAN? For the DASH eating plan, you will follow these general guidelines:  Choose foods with a percent daily value for sodium of less than 5% (as listed on the food label).  Use salt-free seasonings or herbs instead of table salt or sea salt.  Check with your health care provider or pharmacist before using salt substitutes.  Eat lower-sodium products, often labeled as "lower sodium" or "no salt added."  Eat fresh foods.  Eat more vegetables, fruits, and low-fat dairy products.  Choose whole grains. Look for the word "whole" as the first word in the ingredient list.  Choose fish   Limit sweets, desserts, sugars, and sugary drinks.  Choose heart-healthy fats.  Eat veggie cheese   Eat more home-cooked food and less restaurant, buffet, and fast food.  Limit fried foods.  Cook foods using methods other than frying.  Limit canned vegetables. If you do use them, rinse them  well to decrease the sodium.  When eating at a restaurant, ask that your food be prepared with less salt, or no salt if possible.                      WHAT FOODS CAN I EAT? Read Dr Joel Fuhrman's books on The End of Dieting & The End of Diabetes  Grains Whole grain or whole wheat bread. Brown rice. Whole grain or whole wheat pasta. Quinoa, bulgur, and whole grain cereals. Low-sodium cereals. Corn or whole wheat flour tortillas. Whole grain cornbread. Whole grain crackers. Low-sodium crackers.  Vegetables Fresh or frozen vegetables (raw, steamed, roasted, or grilled). Low-sodium or reduced-sodium tomato and vegetable juices. Low-sodium or reduced-sodium tomato sauce and paste. Low-sodium or reduced-sodium canned vegetables.   Fruits All fresh, canned (in natural juice), or frozen fruits.  Protein Products  All fish and seafood.  Dried beans, peas, or lentils. Unsalted nuts and seeds. Unsalted canned beans.  Dairy Low-fat dairy products, such as skim or 1% milk, 2% or reduced-fat cheeses, low-fat ricotta or cottage cheese, or plain low-fat yogurt. Low-sodium or reduced-sodium cheeses.  Fats and Oils Tub margarines without trans fats. Light or reduced-fat mayonnaise and salad dressings (reduced sodium). Avocado. Safflower, olive, or canola oils. Natural peanut or almond butter.  Other Unsalted popcorn and pretzels. The items listed above may not be a complete list of recommended foods or beverages. Contact your dietitian for more options.  +++++++++++++++  WHAT FOODS ARE NOT RECOMMENDED? Grains/ White flour or wheat flour White bread. White pasta. White rice. Refined cornbread. Bagels and croissants. Crackers that contain trans fat.  Vegetables  Creamed or fried vegetables. Vegetables in a . Regular canned vegetables. Regular canned tomato sauce and paste. Regular tomato and vegetable juices.  Fruits Dried fruits. Canned fruit in light or heavy syrup. Fruit juice.  Meat and  Other Protein Products Meat in general - RED meat & White meat.  Fatty cuts of meat. Ribs, chicken wings, all processed meats as bacon, sausage, bologna, salami, fatback, hot dogs, bratwurst and packaged luncheon meats.  Dairy Whole or 2% milk, cream, half-and-half, and cream cheese. Whole-fat or sweetened yogurt. Full-fat cheeses or blue cheese. Non-dairy creamers and whipped toppings. Processed cheese, cheese spreads, or cheese curds.  Condiments Onion and garlic salt, seasoned salt, table salt, and sea salt. Canned and packaged gravies. Worcestershire sauce. Tartar sauce. Barbecue sauce. Teriyaki sauce. Soy sauce, including reduced sodium. Steak sauce. Fish sauce. Oyster sauce.   Cocktail sauce. Horseradish. Ketchup and mustard. Meat flavorings and tenderizers. Bouillon cubes. Hot sauce. Tabasco sauce. Marinades. Taco seasonings. Relishes.  Fats and Oils Butter, stick margarine, lard, shortening and bacon fat. Coconut, palm kernel, or palm oils. Regular salad dressings.  Pickles and olives. Salted popcorn and pretzels.  The items listed above may not be a complete list of foods and beverages to avoid.   Bleeding Precautions When on Anticoagulant Therapy, Adult  Anticoagulant therapy, also called blood thinner therapy, is medicine that helps to prevent and treat blood clots. The medicine works by stopping blood clots from forming or growing. Blood clots that form in your blood vessels can be dangerous. They can break loose and travel to the heart, lungs, or brain. This increases the risk of a heart attack, stroke, or blocked lung artery (pulmonary embolism). Anticoagulants also increase the risk of bleeding. Try to protect yourself from cuts and other injuries that can cause bleeding. It is important to take anticoagulants exactly as told by your health care provider. Why do I need to be on anticoagulant therapy? You may need this medicine if you are at risk of developing a blood clot.  Conditions that increase your risk of a blood clot include:  Being born with heart disease or a heart malformation (congenital heart disease).  Developing heart disease.  Having had surgery, such as valve replacement.  Having had a serious accident or other type of severe injury (trauma).  Having certain types of cancer.  Having certain diseases that can increase blood clotting.  Having a high risk of stroke or heart attack.  Having atrial fibrillation (AF). What are the common anticoagulant medicines? There are several types of anticoagulant medicines. The most common types are:  Medicines that you take by mouth (oral medicines), such as: ? Warfarin. ? Novel oral anticoagulants (NOACs), such as: ? Direct thrombin inhibitors (dabigatran). ? Factor Xa inhibitors (apixaban, edoxaban, and rivaroxaban).  Injections, such as: ? Unfractionated heparin. ? Low molecular weight heparin. These anticoagulants work in different ways to prevent blood clots. They also have different risks and side effects. What do I need to remember while on anticoagulant therapy? Taking anticoagulants  Take your medicine at the same time every day. If you forget to take your medicine, take it as soon as you remember. Do not double your dosage of medicine if you miss a whole day. Take your normal dose and call your health care provider.  Do not stop taking your medicine unless your health care provider approves. Stopping the medicine can increase your risk of developing a blood clot. Taking other medicines  Take over-the-counter and prescriptions medicines only as told by your health care provider.  Do not take over-the-counter NSAIDs, including aspirin and ibuprofen, while you are on anticoagulant therapy. These medicines increase your risk of dangerous bleeding.  Get approval from your health care provider before you start taking any new medicines, vitamins, or herbal products. Some of these could  interfere with your therapy. General instructions  Keep all follow-up visits as told by your health care provider. This is important.  If you are pregnant or trying to get pregnant, talk with a health care provider about anticoagulants. Some of these medicines are not safe to take during pregnancy.  Tell all health care providers, including your dentist, that you are on anticoagulant therapy. It is especially important to tell providers before you have any surgery, medical procedures, or dental work done. What precautions should I take?   Be  very careful when using knives, scissors, or other sharp objects.  Use an electric razor instead of a blade.  Do not use toothpicks.  Use a soft-bristled toothbrush. Brush your teeth gently.  Always wear shoes outdoors and wear slippers indoors.  Be careful when cutting your fingernails and toenails.  Place bath mats in the bathroom. If possible, install handrails as well.  Wear gloves while you do yard work.  Wear your seat belt.  Prevent falls by removing loose rugs and extension cords from areas where you walk. Use a cane or walker if you need it.  Avoid constipation by: ? Drinking enough fluid to keep your urine clear or pale yellow. ? Eating foods that are high in fiber, such as fresh fruits and vegetables, whole grains, and beans. ? Limiting foods that are high in fat and processed sugars, such as fried and sweet foods.  Do not play contact sports or participate in other activities that have a high risk for injury. What other precautions are important if on warfarin therapy? If you are taking a type of anticoagulant called warfarin, make sure you:  Work with a diet and nutrition specialist (dietitian) to make an eating plan. Do not make any sudden changes to your diet after you have started your eating plan.  Do not drink alcohol. It can interfere with your medicine and increase your risk of an injury that causes bleeding.  Get  regular blood tests as told by your health care provider. What are some questions to ask my health care provider?  Why do I need anticoagulant therapy?  What is the best anticoagulant therapy for my condition?  How long will I need anticoagulant therapy?  What are the side effects of anticoagulant therapy?  When should I take my medicine? What should I do if I forget to take it?  Will I need to have regular blood tests?  Do I need to change my diet? Are there foods or drinks that I should avoid?  What activities are safe for me?  What should I do if I want to get pregnant? Contact a health care provider if:  You miss a dose of medicine: ? And you are not sure what to do. ? For more than one day.  You have: ? Menstrual bleeding that is heavier than normal. ? Bloody or brown urine. ? Easy bruising. ? Black and tarry stool or bright red stool. ? Side effects from your medicine.  You feel weak or dizzy.  You become pregnant. Get help right away if:  You have bleeding that will not stop within 20 minutes from: ? The nose. ? The gums. ? A cut on the skin.  You have a severe headache or stomachache.  You vomit or cough up blood.  You fall or hit your head. Summary  Anticoagulant therapy, also called blood thinner therapy, is medicine that helps to prevent and treat blood clots.  Anticoagulants work in different ways to prevent blood clots. They also have different risks and side effects.  Talk with your health care provider about any precautions that you should take while on anticoagulant therapy.

## 2018-08-30 ENCOUNTER — Other Ambulatory Visit: Payer: Self-pay

## 2018-08-30 ENCOUNTER — Other Ambulatory Visit: Payer: Self-pay | Admitting: Physician Assistant

## 2018-08-30 ENCOUNTER — Encounter: Payer: Self-pay | Admitting: Internal Medicine

## 2018-08-30 ENCOUNTER — Ambulatory Visit: Payer: Medicare Other | Admitting: Internal Medicine

## 2018-08-30 ENCOUNTER — Ambulatory Visit: Payer: Self-pay | Admitting: Internal Medicine

## 2018-08-30 VITALS — BP 134/59 | HR 87

## 2018-08-30 DIAGNOSIS — E559 Vitamin D deficiency, unspecified: Secondary | ICD-10-CM

## 2018-08-30 DIAGNOSIS — R7303 Prediabetes: Secondary | ICD-10-CM

## 2018-08-30 DIAGNOSIS — E782 Mixed hyperlipidemia: Secondary | ICD-10-CM

## 2018-08-30 DIAGNOSIS — K219 Gastro-esophageal reflux disease without esophagitis: Secondary | ICD-10-CM

## 2018-08-30 DIAGNOSIS — I482 Chronic atrial fibrillation, unspecified: Secondary | ICD-10-CM

## 2018-08-30 DIAGNOSIS — I10 Essential (primary) hypertension: Secondary | ICD-10-CM

## 2018-08-30 DIAGNOSIS — Z79899 Other long term (current) drug therapy: Secondary | ICD-10-CM

## 2018-08-30 DIAGNOSIS — Z950 Presence of cardiac pacemaker: Secondary | ICD-10-CM

## 2018-08-30 DIAGNOSIS — R7309 Other abnormal glucose: Secondary | ICD-10-CM | POA: Diagnosis not present

## 2018-08-30 DIAGNOSIS — F419 Anxiety disorder, unspecified: Secondary | ICD-10-CM

## 2018-08-30 MED ORDER — ESCITALOPRAM OXALATE 10 MG PO TABS: ORAL_TABLET | ORAL | 3 refills | Status: DC

## 2018-09-01 ENCOUNTER — Telehealth: Payer: Self-pay

## 2018-09-01 NOTE — Telephone Encounter (Signed)
lmom for prescreen  

## 2018-09-02 ENCOUNTER — Other Ambulatory Visit: Payer: Self-pay | Admitting: Cardiovascular Disease

## 2018-09-02 NOTE — Telephone Encounter (Signed)
Diltiazem CD 180 mg refilled.

## 2018-09-04 ENCOUNTER — Other Ambulatory Visit: Payer: Self-pay

## 2018-09-04 ENCOUNTER — Telehealth: Payer: Self-pay | Admitting: *Deleted

## 2018-09-04 ENCOUNTER — Ambulatory Visit (INDEPENDENT_AMBULATORY_CARE_PROVIDER_SITE_OTHER): Payer: Medicare Other | Admitting: Pharmacist

## 2018-09-04 DIAGNOSIS — Z7901 Long term (current) use of anticoagulants: Secondary | ICD-10-CM | POA: Diagnosis not present

## 2018-09-04 DIAGNOSIS — I482 Chronic atrial fibrillation, unspecified: Secondary | ICD-10-CM | POA: Diagnosis not present

## 2018-09-04 LAB — POCT INR: INR: 1.7 — AB (ref 2.0–3.0)

## 2018-09-04 MED ORDER — POTASSIUM CHLORIDE CRYS ER 20 MEQ PO TBCR: 20.0000 meq | EXTENDED_RELEASE_TABLET | Freq: Every day | ORAL | 2 refills | Status: DC

## 2018-09-04 NOTE — Telephone Encounter (Signed)

## 2018-09-04 NOTE — Patient Instructions (Signed)
Description   Spoke with pt and instructed 2 tablets today then continue taking 1.5 tablets daily except 1 tablet each Sunday, Tuesday and Thursday.  Repeat INR in 3 weeks.

## 2018-09-06 ENCOUNTER — Other Ambulatory Visit: Payer: Medicare Other

## 2018-09-06 ENCOUNTER — Other Ambulatory Visit: Payer: Self-pay

## 2018-09-06 DIAGNOSIS — Z79899 Other long term (current) drug therapy: Secondary | ICD-10-CM

## 2018-09-06 DIAGNOSIS — R7309 Other abnormal glucose: Secondary | ICD-10-CM

## 2018-09-06 DIAGNOSIS — E782 Mixed hyperlipidemia: Secondary | ICD-10-CM

## 2018-09-06 DIAGNOSIS — R7303 Prediabetes: Secondary | ICD-10-CM

## 2018-09-06 DIAGNOSIS — I10 Essential (primary) hypertension: Secondary | ICD-10-CM

## 2018-09-06 DIAGNOSIS — I482 Chronic atrial fibrillation, unspecified: Secondary | ICD-10-CM

## 2018-09-06 DIAGNOSIS — K219 Gastro-esophageal reflux disease without esophagitis: Secondary | ICD-10-CM

## 2018-09-06 DIAGNOSIS — E559 Vitamin D deficiency, unspecified: Secondary | ICD-10-CM

## 2018-09-07 LAB — LIPID PANEL
Cholesterol: 156 mg/dL (ref <200)
HDL: 47 mg/dL — ABNORMAL LOW (ref >=50)
LDL Cholesterol (Calc): 85 mg/dL (calc)
Non-HDL Cholesterol (Calc): 109 mg/dL (calc) (ref <130)
Total CHOL/HDL Ratio: 3.3 (calc) (ref <5.0)
Triglycerides: 143 mg/dL (ref <150)

## 2018-09-07 LAB — CBC WITH DIFFERENTIAL/PLATELET
Absolute Monocytes: 781 cells/uL (ref 200–950)
Basophils Absolute: 58 cells/uL (ref 0–200)
Basophils Relative: 0.8 %
Eosinophils Absolute: 73 cells/uL (ref 15–500)
Eosinophils Relative: 1 %
HCT: 42.9 % (ref 35.0–45.0)
Hemoglobin: 14.4 g/dL (ref 11.7–15.5)
Lymphs Abs: 1898 cells/uL (ref 850–3900)
MCH: 31 pg (ref 27.0–33.0)
MCHC: 33.6 g/dL (ref 32.0–36.0)
MCV: 92.5 fL (ref 80.0–100.0)
MPV: 10.8 fL (ref 7.5–12.5)
Monocytes Relative: 10.7 %
Neutro Abs: 4490 cells/uL (ref 1500–7800)
Neutrophils Relative %: 61.5 %
Platelets: 237 10*3/uL (ref 140–400)
RBC: 4.64 10*6/uL (ref 3.80–5.10)
RDW: 11.9 % (ref 11.0–15.0)
Total Lymphocyte: 26 %
WBC: 7.3 10*3/uL (ref 3.8–10.8)

## 2018-09-07 LAB — COMPLETE METABOLIC PANEL WITH GFR
AG Ratio: 1.8 (calc) (ref 1.0–2.5)
ALT: 13 U/L (ref 6–29)
AST: 18 U/L (ref 10–35)
Albumin: 4.2 g/dL (ref 3.6–5.1)
Alkaline phosphatase (APISO): 103 U/L (ref 37–153)
BUN/Creatinine Ratio: 18 (calc) (ref 6–22)
BUN: 20 mg/dL (ref 7–25)
CO2: 30 mmol/L (ref 20–32)
Calcium: 9.6 mg/dL (ref 8.6–10.4)
Chloride: 102 mmol/L (ref 98–110)
Creat: 1.13 mg/dL — ABNORMAL HIGH (ref 0.60–0.88)
GFR, Est African American: 50 mL/min/{1.73_m2} — ABNORMAL LOW (ref >=60)
GFR, Est Non African American: 43 mL/min/{1.73_m2} — ABNORMAL LOW (ref >=60)
Globulin: 2.4 g/dL (calc) (ref 1.9–3.7)
Glucose, Bld: 86 mg/dL (ref 65–99)
Potassium: 4.4 mmol/L (ref 3.5–5.3)
Sodium: 141 mmol/L (ref 135–146)
Total Bilirubin: 0.5 mg/dL (ref 0.2–1.2)
Total Protein: 6.6 g/dL (ref 6.1–8.1)

## 2018-09-07 LAB — HEMOGLOBIN A1C
Hgb A1c MFr Bld: 5.6 % of total Hgb (ref <5.7)
Mean Plasma Glucose: 114 (calc)
eAG (mmol/L): 6.3 (calc)

## 2018-09-07 LAB — TSH: TSH: 1.76 mIU/L (ref 0.40–4.50)

## 2018-09-07 LAB — VITAMIN D 25 HYDROXY (VIT D DEFICIENCY, FRACTURES): Vit D, 25-Hydroxy: 71 ng/mL (ref 30–100)

## 2018-09-07 LAB — INSULIN, RANDOM: Insulin: 21 u[IU]/mL — ABNORMAL HIGH

## 2018-09-07 LAB — MAGNESIUM: Magnesium: 1.9 mg/dL (ref 1.5–2.5)

## 2018-09-22 ENCOUNTER — Telehealth: Payer: Self-pay

## 2018-09-22 NOTE — Telephone Encounter (Signed)

## 2018-09-26 ENCOUNTER — Ambulatory Visit (INDEPENDENT_AMBULATORY_CARE_PROVIDER_SITE_OTHER): Payer: Medicare Other | Admitting: Pharmacist

## 2018-09-26 ENCOUNTER — Other Ambulatory Visit: Payer: Self-pay

## 2018-09-26 DIAGNOSIS — I482 Chronic atrial fibrillation, unspecified: Secondary | ICD-10-CM

## 2018-09-26 DIAGNOSIS — Z7901 Long term (current) use of anticoagulants: Secondary | ICD-10-CM

## 2018-09-26 DIAGNOSIS — I495 Sick sinus syndrome: Secondary | ICD-10-CM | POA: Insufficient documentation

## 2018-09-26 LAB — POCT INR: INR: 1.7 — AB (ref 2.0–3.0)

## 2018-09-26 NOTE — Progress Notes (Signed)
[name], female   DOB: 13-Mar-1928, 85 y.o.   MRN: [id number]  MEDICARE ANNUAL WELLNESS VISIT AND FOLLOW UP  THIS ENCOUNTER IS A VIRTUAL/TELEPHONE VISIT DUE TO COVID-19 - PATIENT WAS NOT SEEN IN THE OFFICE.  PATIENT HAS CONSENTED TO VIRTUAL VISIT / TELEMEDICINE VISIT  This provider placed a call to [employer] using telephone, her appointment was changed to a virtual office visit to reduce the risk of exposure to the COVID-19 virus and to help Ogechi E Cullom remain healthy and safe. The virtual visit will also provide continuity of care. She verbalizes understanding.    Assessment:    Encounter for Medicare annual wellness exam  Essential hypertension Continue medication Will increase micardis to 1 pill a day Monitor blood pressure at home; call if consistently over 130/80 Continue DASH diet.   Reminder to go to the ER if any CP, SOB, nausea, dizziness, severe HA, changes vision/speech, left arm numbness and tingling and jaw pain.  Atherosclerosis of native coronary artery of native heart with angina pectoris ([place]) Control blood pressure, cholesterol, glucose, increase exercise.  Followed closely by cardiology Increase micardis to 1 pill and continue to monitor BP No chest pains recently, had perfusion studies and ECHO in April 2019   Chronic diastolic heart failure (HCC) Weights stable, monitor, no symptoms at this time  Permanent atrial fibrillation ([place]) Followed by coumadin clinic Reminded to go to ER for any head injury, call with concerning signs of bleeding  Gastroesophageal reflux disease, esophagitis presence not specified Symptoms controlled at this time, lifestyle emphasized  Osteoarthritis, unspecified osteoarthritis type, unspecified site Mild in hands, declines treatment  Vitamin D deficiency At goal at recent check;  Defer vitamin D level  Abnormal glucose Recent A1Cs at goal Discussed diet/exercise, weight management  Defer A1C; check  CMP  Pacemaker Followed by cardiology  Medication management CBC, CMP/GFR  Long term current use of anticoagulant therapy Followed by cardiology  Hyperlipidemia No longer on medication secondary to age Continue low cholesterol diet and exercise.  Check lipid panel.   Fibrocystic breast disease (FCBD), unspecified laterality Refuses further mammograms due to age  Overweight (BMI 25.0-29.9) Continue to recommend diet heavy in fruits and veggies and low in animal meats, cheeses, and dairy products, appropriate calorie intake Discuss exercise recommendations routinely Continue to monitor weight at each visit   Over 30 minutes of exam, counseling, chart review, and critical decision making was performed  Future Appointments  Date Time Provider [place]  10/10/2018  9:15 AM [facility]  10/18/2018  7:25 AM [facility] DEVICE REMOTES [facility]  11/13/2018  9:20 AM [name], [name], MD [facility]  12/04/2018 10:45 AM Vicie Mutters, PA-C [facility] None  03/12/2019 10:00 AM [name], MD [facility] None     Plan:   During the course of the visit the patient was educated and counseled about appropriate screening and preventive services including:    Pneumococcal vaccine   Influenza vaccine  Td vaccine  Prevnar 13  Screening electrocardiogram  Screening mammography  Bone densitometry screening  Colorectal cancer screening  Diabetes screening  Glaucoma screening  Nutrition counseling   Advanced directives: given info/requested copies    Subjective:   Misty Blackwell is a 85 y.o. female who presents for Medicare Annual Wellness Visit and 6 month follow up.   Put Misty Blackwell in a nursing facility, trying to clean out her house to put it on the market.   Patient had negative /Nl  Ht caths in x2 in 1993 & 2007.  In 2003, her Myoview was Negative for Ischemia.   In 2006 , she had an embolic CVA with  Rt hemifacial paresthesias (resolved) and was found in Afib and has been on Coumadin since - monitored at [facility] (last INR 2.5).  She had a PPM implanted in 2007 for SSS and is followed by Dr Sallyanne Kuster. She recently had perfusion studies and ECHO in April of 2019 with good results.  Had probable TIA in Dec with aphasia that resolved, started on statin at that time.  She is on 1.5 tablets 4 days a week and 1 tablet other days, recently adjusted.  Lab Results  Component Value Date   INR 1.7 (A) 09/26/2018   INR 1.7 (A) 09/04/2018   INR 2.0 08/07/2018   She is on lexapro and xanax AS needed, doing well at this time, no longer having depression. She is painting a lot.   BMI is There is no height or weight on file to calculate BMI., she has been working on diet and exercise. Wt Readings from Last 3 Encounters:  06/05/18 146 lb 3.2 oz (66.3 kg)  05/31/18 146 lb 3.2 oz (66.3 kg)  05/16/18 144 lb (65.3 kg)   Her blood pressure has been controlled at home, today their BP is  BP Readings from Last 3 Encounters:  09/27/18 (!) 141/69  08/30/18 (!) 134/59  06/05/18 128/68    She does not workout. She denies chest pain, shortness of breath, dizziness.   She is on cholesterol medication due to recent TIA with aphasia that resolved in Dec.. Her cholesterol is not at goal. The cholesterol last visit was:   Lab Results  Component Value Date   CHOL 156 09/06/2018   HDL 47 (L) 09/06/2018   LDLCALC 85 09/06/2018   TRIG 143 09/06/2018   CHOLHDL 3.3 09/06/2018   Last A1C in the office was:  Lab Results  Component Value Date   HGBA1C 5.6 09/06/2018     Lab Results  Component Value Date   GFRNONAA 43 (L) 09/06/2018   Patient is on Vitamin D supplement.   Lab Results  Component Value Date   VD25OH 71 09/06/2018      Medication Review Current Outpatient Medications on File Prior to Visit  Medication Sig Dispense Refill  . ALPRAZolam (XANAX) 1 MG tablet TAKE 1/2 TO 1 TABLET  BY MOUTH EVERY DAY TO TWICE DAILY. ONLY IF NEEDED FOR ANXIETY ATTACK AND LIMIT TO 5 DAYS A WEEK TO AVOID ADDICTION 60 tablet 0  . B Complex-C (SUPER B COMPLEX PO) Take 1 tablet by mouth daily.    Marland Kitchen BIOTIN PO Take 10,000 mcg by mouth daily.     . Cholecalciferol (VITAMIN D3) 3000 units TABS Take 3,000 tablets by mouth daily.     . Cyanocobalamin (VITAMIN B 12 PO) Take 1,000 mcg by mouth daily.     Marland Kitchen diltiazem (CARDIZEM CD) 180 MG 24 hr capsule TAKE 1 CAPSULE BY MOUTH EVERY DAY 90 capsule 1  . escitalopram (LEXAPRO) 10 MG tablet Take 1/2 to 1 tablet Daily for chronic anxiety 90 tablet 3  . furosemide (LASIX) 40 MG tablet Take 1 tablet (40 mg total) by mouth daily. 90 tablet 3  . IRON PO Take 65 mg by mouth daily.     Marland Kitchen MAGNESIUM PO Take 1 tablet by mouth daily.    . Multiple Vitamins-Minerals (ZINC PO) Take 1 tablet by mouth daily.    Marland Kitchen  nitroGLYCERIN (NITROSTAT) 0.4 MG SL tablet Place 0.4 mg under the tongue every 5 (five) minutes as needed for chest pain.    . potassium chloride SA (K-DUR) 20 MEQ tablet Take 1 tablet (20 mEq total) by mouth daily. 90 tablet 2  . pravastatin (PRAVACHOL) 20 MG tablet Take 1 tablet (20 mg total) by mouth daily at 6 PM. 30 tablet 0  . telmisartan (MICARDIS) 80 MG tablet TAKE ONE-HALF TO 1 TABLET BY MOUTH EVERY MORNING FOR BLOOD PRESSURE AND HEART 90 tablet 1  . traZODone (DESYREL) 150 MG tablet Take 1 tablet 1 hour before Bedtime 90 tablet 1  . warfarin (COUMADIN) 3 MG tablet TAKE 1 TO 1 AND 1/2 TABLET BY MOUTH TABLETS AS DIRECTED (Patient taking differently: Take 3-4.5 mg by mouth daily. ) 135 tablet 2   No current facility-administered medications on file prior to visit.     Current Problems (verified) Patient Active Problem List   Diagnosis Date Noted  . SSS (sick sinus syndrome) ([place]) 09/26/2018  . Abnormal glucose 08/29/2018  . Neural foraminal stenosis of cervical spine 04/18/2018  . TIA (transient ischemic attack) 04/17/2018  . Chronic atrial  fibrillation   . Anxiety 11/22/2017  . Overweight (BMI 25.0-29.9) 11/21/2017  . Chronic diastolic heart failure ([place]) 11/10/2017  . Vitamin D deficiency 08/15/2013  . Medication management 08/15/2013  . Pacemaker 09/19/2012  . Long term current use of anticoagulant therapy 07/18/2012  . Hyperlipidemia, mixed 09/03/2008  . Essential hypertension 09/03/2008  . Coronary atherosclerosis 09/03/2008  . GERD 09/03/2008  . FIBROCYSTIC BREAST DISEASE 09/03/2008  . Osteoarthritis 09/03/2008    Screening Tests Immunization History  Administered Date(s) Administered  . DT 03/19/2015  . Influenza Split 05/04/2011  . Influenza, High Dose Seasonal PF 03/19/2014, 12/23/2015, 02/03/2017  . Influenza,inj,quad, With Preservative 05/21/2013  . Influenza-Unspecified 02/04/2015, 02/03/2017  . Pneumococcal Conjugate-13 03/19/2014  . Pneumococcal Polysaccharide-23 05/04/2011  . Pneumococcal-Unspecified 05/03/2001  . Td 05/04/2003    Preventative care: Last colonoscopy: 2010 declines due to age.  Last mammogram: refused DEXA declines CT head without contrast 04/2018 CXR 05/2018 Stress test 08/2017 Echo 04/2018  Prior vaccinations: TD or Tdap: 2016  Influenza: 2019 Pneumococcal: 2003 Prevnar13: 2015  Shingles/Zostavax: Declined  Names of Other Physician/Practitioners you currently use: 1. [facility]- here for primary care 2. Dr. Katy Fitch , eye doctor, last visit 2019 has upcoming appointment 3. Dr. Ronnald Ramp, dentist, last visit 2019  Patient Care Team: [name], MD as PCP - General (Internal Medicine) [name], MD as Consulting Physician (Urology) Penni Bombard, MD as Consulting Physician (Neurology) [name], MD as Consulting Physician (Neurology) Sanda Klein, MD as Consulting Physician (Cardiology) [name], MD (Inactive) as Consulting Physician (Gastroenterology)  Allergies Allergies  Allergen Reactions  .  Latex Itching  . Ace Inhibitors Other (See Comments)    Unknown reaction  . Augmentin [Amoxicillin-Pot Clavulanate] Other (See Comments)  . Ciprofloxacin Other (See Comments)  . Levaquin [Levofloxacin In D5w] Other (See Comments)  . Zocor [Simvastatin] Other (See Comments)  . Acrylic Polymer [Carbomer] Itching  . Chocolate Other (See Comments)    migraine's   . Gabapentin Other (See Comments)    Unsteady gait     SURGICAL HISTORY She  has a past surgical history that includes Cardioversion (11/19/2009); Cardiac catheterization (08/06/2005); Cholecystectomy; Appendectomy; Abdominal hysterectomy; and Skin cancer resection. FAMILY HISTORY Her family history includes Cirrhosis in her brother; Diabetes in her mother; Heart attack in her father; Heart disease  in her father and mother. SOCIAL HISTORY She  reports that she has never smoked. She has never used smokeless tobacco. She reports that she does not drink alcohol or use drugs.  MEDICARE WELLNESS OBJECTIVES: Physical activity: Current Exercise Habits: The patient does not participate in regular exercise at present(she does house work and yard work) Cardiac risk factors: Cardiac Risk Factors include: advanced age (>28men, >33 women);dyslipidemia;hypertension;smoking/ tobacco exposure Depression/mood screen:   Depression screen [facility] 2/9 09/27/2018  Decreased Interest 0  Down, Depressed, Hopeless 0  PHQ - 2 Score 0  Altered sleeping -  Tired, decreased energy -  Change in appetite -  Feeling bad or failure about yourself  -  Trouble concentrating -  Moving slowly or fidgety/restless -  Suicidal thoughts -  PHQ-9 Score -  Difficult doing work/chores -    ADLs:  In your present state of health, do you have any difficulty performing the following activities: 09/27/2018 08/29/2018  Hearing? N N  Comment - -  Vision? N N  Difficulty concentrating or making decisions? N N  Walking or climbing stairs? N N  Dressing or bathing? N N  Doing  errands, shopping? N N  Some recent data might be hidden     Cognitive Testing  Alert? Yes  Normal Appearance?Yes  Oriented to person? Yes  Place? Yes   Time? Yes  Recall of three objects?  Yes  Can perform simple calculations? Yes  Displays appropriate judgment?Yes  Can read the correct time from a watch face?Yes  EOL planning: Does Patient Have a Medical Advance Directive?: Yes Type of Advance Directive: Living will, Healthcare Power of Attorney Does patient want to make changes to medical advance directive?: No - Patient declined   Objective:   Today's Vitals   09/27/18 1155  BP: (!) 141/69  Pulse: 76   There is no height or weight on file to calculate BMI.  General Appearance:Well sounding, in no apparent distress.  ENT/Mouth: No hoarseness, No cough for duration of visit.  Respiratory: completing full sentences without distress, without audible wheeze Neuro: Awake and oriented X 3,  Psych:  Insight and Judgment appropriate.    Medicare Attestation I have personally reviewed: The patient's medical and social history Their use of alcohol, tobacco or illicit drugs Their current medications and supplements The patient's functional ability including ADLs,fall risks, home safety risks, cognitive, and hearing and visual impairment Diet and physical activities Evidence for depression or mood disorders  The patient's weight, height, BMI, and visual acuity have been recorded in the chart.  I have made referrals, counseling, and provided education to the patient based on review of the above and I have provided the patient with a written personalized care plan for preventive services.     Vicie Mutters, PA-C   09/27/2018

## 2018-09-27 ENCOUNTER — Encounter: Payer: Self-pay | Admitting: Physician Assistant

## 2018-09-27 ENCOUNTER — Other Ambulatory Visit: Payer: Self-pay

## 2018-09-27 ENCOUNTER — Ambulatory Visit (INDEPENDENT_AMBULATORY_CARE_PROVIDER_SITE_OTHER): Payer: Medicare Other | Admitting: Physician Assistant

## 2018-09-27 VITALS — BP 141/69 | HR 76

## 2018-09-27 DIAGNOSIS — M9981 Other biomechanical lesions of cervical region: Secondary | ICD-10-CM

## 2018-09-27 DIAGNOSIS — E663 Overweight: Secondary | ICD-10-CM

## 2018-09-27 DIAGNOSIS — E782 Mixed hyperlipidemia: Secondary | ICD-10-CM

## 2018-09-27 DIAGNOSIS — R7309 Other abnormal glucose: Secondary | ICD-10-CM

## 2018-09-27 DIAGNOSIS — Z0000 Encounter for general adult medical examination without abnormal findings: Secondary | ICD-10-CM | POA: Diagnosis not present

## 2018-09-27 DIAGNOSIS — I5032 Chronic diastolic (congestive) heart failure: Secondary | ICD-10-CM

## 2018-09-27 DIAGNOSIS — Z950 Presence of cardiac pacemaker: Secondary | ICD-10-CM

## 2018-09-27 DIAGNOSIS — Z0001 Encounter for general adult medical examination with abnormal findings: Secondary | ICD-10-CM

## 2018-09-27 DIAGNOSIS — R6889 Other general symptoms and signs: Secondary | ICD-10-CM | POA: Diagnosis not present

## 2018-09-27 DIAGNOSIS — F419 Anxiety disorder, unspecified: Secondary | ICD-10-CM

## 2018-09-27 DIAGNOSIS — G459 Transient cerebral ischemic attack, unspecified: Secondary | ICD-10-CM

## 2018-09-27 DIAGNOSIS — I495 Sick sinus syndrome: Secondary | ICD-10-CM

## 2018-09-27 DIAGNOSIS — N6019 Diffuse cystic mastopathy of unspecified breast: Secondary | ICD-10-CM

## 2018-09-27 DIAGNOSIS — M1990 Unspecified osteoarthritis, unspecified site: Secondary | ICD-10-CM

## 2018-09-27 DIAGNOSIS — I10 Essential (primary) hypertension: Secondary | ICD-10-CM

## 2018-09-27 DIAGNOSIS — Z7901 Long term (current) use of anticoagulants: Secondary | ICD-10-CM

## 2018-09-27 DIAGNOSIS — I25119 Atherosclerotic heart disease of native coronary artery with unspecified angina pectoris: Secondary | ICD-10-CM

## 2018-09-27 DIAGNOSIS — E559 Vitamin D deficiency, unspecified: Secondary | ICD-10-CM

## 2018-09-27 DIAGNOSIS — Z79899 Other long term (current) drug therapy: Secondary | ICD-10-CM

## 2018-09-27 DIAGNOSIS — K219 Gastro-esophageal reflux disease without esophagitis: Secondary | ICD-10-CM

## 2018-09-27 DIAGNOSIS — M4802 Spinal stenosis, cervical region: Secondary | ICD-10-CM

## 2018-09-27 DIAGNOSIS — I482 Chronic atrial fibrillation, unspecified: Secondary | ICD-10-CM

## 2018-10-04 ENCOUNTER — Telehealth: Payer: Self-pay

## 2018-10-04 NOTE — Telephone Encounter (Signed)

## 2018-10-10 ENCOUNTER — Ambulatory Visit (INDEPENDENT_AMBULATORY_CARE_PROVIDER_SITE_OTHER): Payer: Medicare Other | Admitting: Pharmacist

## 2018-10-10 ENCOUNTER — Other Ambulatory Visit: Payer: Self-pay

## 2018-10-10 DIAGNOSIS — Z7901 Long term (current) use of anticoagulants: Secondary | ICD-10-CM | POA: Diagnosis not present

## 2018-10-10 DIAGNOSIS — I482 Chronic atrial fibrillation, unspecified: Secondary | ICD-10-CM

## 2018-10-10 LAB — POCT INR: INR: 2.2 (ref 2.0–3.0)

## 2018-10-18 ENCOUNTER — Ambulatory Visit (INDEPENDENT_AMBULATORY_CARE_PROVIDER_SITE_OTHER): Payer: Medicare Other | Admitting: *Deleted

## 2018-10-18 DIAGNOSIS — I482 Chronic atrial fibrillation, unspecified: Secondary | ICD-10-CM

## 2018-10-18 LAB — CUP PACEART REMOTE DEVICE CHECK
Battery Impedance: 2029 Ohm
Battery Remaining Longevity: 33 mo
Battery Voltage: 2.76 V
Brady Statistic RV Percent Paced: 63 %
Date Time Interrogation Session: 20200617135552
Implantable Lead Implant Date: 20110428
Implantable Lead Implant Date: 20110428
Implantable Lead Location: 753859
Implantable Lead Location: 753860
Implantable Lead Model: 4092
Implantable Lead Model: 4592
Implantable Pulse Generator Implant Date: 20110428
Lead Channel Impedance Value: 67 Ohm
Lead Channel Impedance Value: 758 Ohm
Lead Channel Pacing Threshold Amplitude: 0.625 V
Lead Channel Pacing Threshold Pulse Width: 0.4 ms
Lead Channel Setting Pacing Amplitude: 2.5 V
Lead Channel Setting Pacing Pulse Width: 0.4 ms
Lead Channel Setting Sensing Sensitivity: 5.6 mV

## 2018-10-25 ENCOUNTER — Other Ambulatory Visit: Payer: Self-pay | Admitting: Physician Assistant

## 2018-10-25 DIAGNOSIS — F419 Anxiety disorder, unspecified: Secondary | ICD-10-CM

## 2018-10-30 ENCOUNTER — Encounter: Payer: Self-pay | Admitting: Cardiology

## 2018-10-30 NOTE — Progress Notes (Signed)
Remote pacemaker transmission.   

## 2018-11-06 ENCOUNTER — Telehealth: Payer: Self-pay

## 2018-11-06 NOTE — Telephone Encounter (Signed)

## 2018-11-09 ENCOUNTER — Telehealth: Payer: Self-pay | Admitting: *Deleted

## 2018-11-09 NOTE — Telephone Encounter (Signed)
Left a message for the patient to call back to change her appointment to a virtual appointment and to get consent.

## 2018-11-10 NOTE — Telephone Encounter (Signed)

## 2018-11-10 NOTE — Telephone Encounter (Signed)
Left a message for the patient to call back.  

## 2018-11-13 ENCOUNTER — Ambulatory Visit (INDEPENDENT_AMBULATORY_CARE_PROVIDER_SITE_OTHER): Payer: Medicare Other | Admitting: Cardiovascular Disease

## 2018-11-13 ENCOUNTER — Ambulatory Visit (INDEPENDENT_AMBULATORY_CARE_PROVIDER_SITE_OTHER): Payer: Medicare Other | Admitting: *Deleted

## 2018-11-13 ENCOUNTER — Other Ambulatory Visit: Payer: Self-pay

## 2018-11-13 ENCOUNTER — Encounter: Payer: Self-pay | Admitting: Cardiovascular Disease

## 2018-11-13 VITALS — BP 160/71 | HR 81 | Temp 97.2°F | Ht 64.0 in | Wt 144.2 lb

## 2018-11-13 DIAGNOSIS — Z7901 Long term (current) use of anticoagulants: Secondary | ICD-10-CM | POA: Diagnosis not present

## 2018-11-13 DIAGNOSIS — I482 Chronic atrial fibrillation, unspecified: Secondary | ICD-10-CM

## 2018-11-13 DIAGNOSIS — I5032 Chronic diastolic (congestive) heart failure: Secondary | ICD-10-CM

## 2018-11-13 DIAGNOSIS — I10 Essential (primary) hypertension: Secondary | ICD-10-CM

## 2018-11-13 DIAGNOSIS — I495 Sick sinus syndrome: Secondary | ICD-10-CM

## 2018-11-13 DIAGNOSIS — E782 Mixed hyperlipidemia: Secondary | ICD-10-CM

## 2018-11-13 DIAGNOSIS — Z950 Presence of cardiac pacemaker: Secondary | ICD-10-CM

## 2018-11-13 LAB — POCT INR: INR: 3.1 — AB (ref 2.0–3.0)

## 2018-11-13 NOTE — Progress Notes (Addendum)
[name], female   DOB: [birth year], 85 y.o.   MRN: [id number]    Cardiology Office Note    Date:  11/13/2018   ID:  DESERIE DIRKS, DOB [birth year], MRN [id number]  PCP:  [name], MD  Cardiologist:   Sanda Klein, MD   Chief Complaint  Patient presents with  . Atrial Fibrillation  . Pacemaker Check    History of Present Illness:  Misty Blackwell is a 85 y.o. female who presents for follow-up of permanent atrial fibrillation with slow ventricular response and pacemaker ([employer]), remote TIA, HTN, minor CAD, asymptomatic pacemaker-detected NSVT, history of diastolic HF.  From a physical standpoint she is doing great but she has had some emotional upheaval.  She continues to work out with a home ergometer that allows her to do both legs and upper body exercises.  She has been busy cleaning out her mother's old home and readying it for sale.  Her sister, with whom she lived for many years had to be placed in a nursing home due to worsening dementia.  This has taken a big toll on her.  Her blood pressure is a little high today, but she thinks it's because of these emotional problems.  Her usual blood pressure at home is in the 120s, the highest systolic blood pressures she seen at home has been 142.  Last week she had excision of a skin cancer on her chin with a fairly lengthy incision.  It is healing well.  She continues to complain of neuropathy in the soles of her feet which makes it very uncomfortable to walk on hard surfaces, but has no cardiovascular complaints. The patient specifically denies any chest pain at rest exertion, dyspnea at rest or with exertion, orthopnea, paroxysmal nocturnal dyspnea, syncope, palpitations, focal neurological deficits, intermittent claudication, lower extremity edema, unexplained weight gain, cough, hemoptysis or wheezing.  Pacemaker interrogation performed last month shows normal device function, permanent atrial fibrillation with  generally good ventricular rate control and rare episodes of RVR.  Estimated battery longevity as of June 2020 was 33 months.  Average ventricular pacing has been in the 60-65% range since we decrease the dose of diltiazem.  In 2016 she underwent echocardiography and nuclear stress testing.  Left ventricular systolic function was normal, she had a fixed moderate apical defect but without reversible ischemia.  Moderate tricuspid regurgitation and moderate pulmonary artery hypertension with estimated systolic PA pressure of 58 mmHg.  In April 2019 she had a nuclear stress test showing normal perfusion and an echocardiogram which showed unchanged findings of mild LVH, normal left ventricular systolic function, mild left atrial dilation, improved degree of pulmonary hypertension 41 mmHg.  She does not have any signs or symptoms of COVID-19 infection.  Past Medical History:  Diagnosis Date  . Atrial fib/flutter, transient   . Atrial fibrillation, chronic   . CHF (congestive heart failure) ([place]) 10/29/2009   Echo - EF >55%; normal LV size and systolic function; unable to assess diastolic fcn due to E/A fusion, pulmonary vein flow pattern suggests elevated filling pressure; marked biatrail dilation, mild/mod tricuspid regurgitation; mod pulmonary htn; mild/mod mitral regurgitation; although echocardiographic features are incomplete findings suggest possible infiltrative cardiomyopathy (maybe amyloidosi  . Coronary artery disease 03/19/2002   R/P Cardiolite - EF 76%; nromal static and dynamic myocardial perfusion images; normal wall motion and endocardial thickening in all vascular territories  . Facial numbness 12/26/2008   carotid doppler - R and L ICAs 0-49% diameter reduction (  velocities suggest low end of scale)  . Hypertension   . Pacemaker   . Peripheral neuropathy   . Skin cancer    s/p surgical removal.  . TIA (transient ischemic attack)     Past Surgical History:  Procedure Laterality Date   . ABDOMINAL HYSTERECTOMY    . APPENDECTOMY    . CARDIAC CATHETERIZATION  08/06/2005   minimal coronary disease predominant RCA; no significant atherosclerosis; new onset sick sinus syndrome and atrial flutter w/ ventricular response, controlled on med therapy; systemic HTN, normal renal arteries  . CARDIOVERSION  11/19/2009   successful DCCV from AF to sinus type rhythm  . CHOLECYSTECTOMY    . Skin cancer resection      Outpatient Medications Prior to Visit  Medication Sig Dispense Refill  . ALPRAZolam (XANAX) 1 MG tablet Take 1/2 to 1 tablet 1 - 2  x / day ONLY if needed for Anxiety  & please try to limit to 5 days /week to avoid addiction 60 tablet 0  . B Complex-C (SUPER B COMPLEX PO) Take 1 tablet by mouth daily.    Marland Kitchen BIOTIN PO Take 10,000 mcg by mouth daily.     . Cholecalciferol (VITAMIN D3) 3000 units TABS Take 3,000 tablets by mouth daily.     . Cyanocobalamin (VITAMIN B 12 PO) Take 1,000 mcg by mouth daily.     Marland Kitchen diltiazem (CARDIZEM CD) 180 MG 24 hr capsule TAKE 1 CAPSULE BY MOUTH EVERY DAY 90 capsule 1  . escitalopram (LEXAPRO) 10 MG tablet Take 1/2 to 1 tablet Daily for chronic anxiety 90 tablet 3  . furosemide (LASIX) 40 MG tablet Take 1 tablet (40 mg total) by mouth daily. 90 tablet 3  . IRON PO Take 65 mg by mouth daily.     Marland Kitchen MAGNESIUM PO Take 1 tablet by mouth daily.    . Multiple Vitamins-Minerals (ZINC PO) Take 1 tablet by mouth daily.    . nitroGLYCERIN (NITROSTAT) 0.4 MG SL tablet Place 0.4 mg under the tongue every 5 (five) minutes as needed for chest pain.    . potassium chloride SA (K-DUR) 20 MEQ tablet Take 1 tablet (20 mEq total) by mouth daily. 90 tablet 2  . pravastatin (PRAVACHOL) 20 MG tablet Take 1 tablet (20 mg total) by mouth daily at 6 PM. 30 tablet 0  . telmisartan (MICARDIS) 80 MG tablet TAKE ONE-HALF TO 1 TABLET BY MOUTH EVERY MORNING FOR BLOOD PRESSURE AND HEART 90 tablet 1  . traZODone (DESYREL) 150 MG tablet Take 1 tablet 1 hour before Bedtime 90  tablet 1  . warfarin (COUMADIN) 3 MG tablet TAKE 1 TO 1 AND 1/2 TABLET BY MOUTH TABLETS AS DIRECTED (Patient taking differently: Take 3-4.5 mg by mouth daily. ) 135 tablet 2   No facility-administered medications prior to visit.      Allergies:   Latex, Ace inhibitors, Augmentin [amoxicillin-pot clavulanate], Ciprofloxacin, Levaquin [levofloxacin in d5w], Zocor [simvastatin], Acrylic polymer [carbomer], Chocolate, and Gabapentin   Social History   Socioeconomic History  . Marital status: Married    Spouse name: Not on file  . Number of children: 0  . Years of education: Not on file  . Highest education level: Not on file  Occupational History  . Not on file  Social Needs  . Financial resource strain: Not on file  . Food insecurity    Worry: Not on file    Inability: Not on file  . Transportation needs    Medical: Not on  file    Non-medical: Not on file  Tobacco Use  . Smoking status: Never Smoker  . Smokeless tobacco: Never Used  Substance and Sexual Activity  . Alcohol use: No  . Drug use: No  . Sexual activity: Never  Lifestyle  . Physical activity    Days per week: Not on file    Minutes per session: Not on file  . Stress: Not on file  Relationships  . Social Herbalist on phone: Not on file    Gets together: Not on file    Attends religious service: Not on file    Active member of club or organization: Not on file    Attends meetings of clubs or organizations: Not on file    Relationship status: Not on file  Other Topics Concern  . Not on file  Social History Narrative   Widowed.  Lives alone.  Ambulates independently.     Family History:  The patient's family history includes Cirrhosis in her brother; Diabetes in her mother; Heart attack in her father; Heart disease in her father and mother.   ROS:   Please see the history of present illness.    ROS All other systems are reviewed and are negative   PHYSICAL EXAM:   VS:  BP (!) 160/71    Pulse 81   Temp (!) 97.2 F (36.2 C)   Ht 5\' 4"  (1.626 m)   Wt 144 lb 3.2 oz (65.4 kg)   SpO2 96%   BMI 24.75 kg/m     General: Alert, oriented x3, no distress Head: no evidence of trauma, PERRL, EOMI, no exophtalmos or lid lag, no myxedema, no xanthelasma; normal ears, nose and oropharynx Neck: normal jugular venous pulsations and no hepatojugular reflux; brisk carotid pulses without delay and no carotid bruits Chest: clear to auscultation, no signs of consolidation by percussion or palpation, normal fremitus, symmetrical and full respiratory excursions Cardiovascular: normal position and quality of the apical impulse, regular rhythm, normal first and paradoxically split second heart sounds, no murmurs, rubs or gallops Abdomen: no tenderness or distention, no masses by palpation, no abnormal pulsatility or arterial bruits, normal bowel sounds, no hepatosplenomegaly Extremities: no clubbing, cyanosis or edema; 2+ radial, ulnar and brachial pulses bilaterally; 2+ right femoral, posterior tibial and dorsalis pedis pulses; 2+ left femoral, posterior tibial and dorsalis pedis pulses; no subclavian or femoral bruits Neurological: grossly nonfocal Psych: Normal mood and affect   Wt Readings from Last 3 Encounters:  11/13/18 144 lb 3.2 oz (65.4 kg)  06/05/18 146 lb 3.2 oz (66.3 kg)  05/31/18 146 lb 3.2 oz (66.3 kg)      Studies/Labs Reviewed:   EKG:  EKG is not ordered today.  ECG from 05/29/2018 shows atrial fibrillation with controlled response, prominent T wave inversion in the inferior leads as well as in V3-V6 (old changes), QTC 449 ms Recent Labs: 05/29/2018: B Natriuretic Peptide 105.3 09/06/2018: ALT 13; BUN 20; Creat 1.13; Hemoglobin 14.4; Magnesium 1.9; Platelets 237; Potassium 4.4; Sodium 141; TSH 1.76   Lipid Panel    Component Value Date/Time   CHOL 156 09/06/2018 1014   TRIG 143 09/06/2018 1014   HDL 47 (L) 09/06/2018 1014   CHOLHDL 3.3 09/06/2018 1014   VLDL 34 (H)  09/28/2016 1125   LDLCALC 85 09/06/2018 1014     ASSESSMENT:    1. Chronic atrial fibrillation   2. Chronic diastolic heart failure ([place])   3. Essential hypertension   4. Hyperlipidemia,  mixed   5. Long term current use of anticoagulant therapy   6. Pacemaker   7. SSS (sick sinus syndrome) ([facility])      PLAN:  In order of problems listed above:  1. AFib with slow ventricular response: After we cut back on the dose of diltiazem the percentage of ventricular pacing has decreased somewhat but is still more than 50%.  Has occasional RVR so I do not think we should decrease it any further..  She has high embolic risk with history of previous transient ischemic attack (CHADSVasc 8: age 89, TIA 2, HTN, CAD, CHF, gender).  She is compliant with anticoagulation. 2. CHF: Appears to be well compensated, no problems with edema. 3. HTN:   Blood pressure is a little bit high today, as it tends to be when she comes into the office.  Asked her to send [place] a summary of her blood pressure log checked once daily at home. 4. Warfarin: Denies bleeding complications.  INR checked today. 5. PPM: She is not device dependent.  Normal device function.  Continue remote downloads every 3 months and yearly office visits. 6. HLP: Unable to tolerate higher doses of pravastatin or more potent statins.  Although she had some coronary plaque, there were no meaningful stenoses.  Recent normal nuclear stress test.  Target LDL less than 100 is satisfactory in her particular case.   Medication Adjustments/Labs and Tests Ordered: Current medicines are reviewed at length with the patient today.  Concerns regarding medicines are outlined above.  Medication changes, Labs and Tests ordered today are listed in the Patient Instructions below. Patient Instructions  Medication Instructions:  No changes If you need a refill on your cardiac medications before your next appointment, please call your pharmacy.   Lab work: None ordered  If you have labs (blood work) drawn today and your tests are completely normal, you will receive your results only by: Marland Kitchen MyChart Message (if you have MyChart) OR . A paper copy in the mail If you have any lab test that is abnormal or we need to change your treatment, we will call you to review the results.  Testing/Procedures: None ordered  Follow-Up: At [facility], you and your health needs are our priority.  As part of our continuing mission to provide you with exceptional heart care, we have created designated Provider Care Teams.  These Care Teams include your primary Cardiologist (physician) and Advanced Practice Providers (APPs -  Physician Assistants and Nurse Practitioners) who all work together to provide you with the care you need, when you need it. You will need a follow up appointment in 12 months.  Please call our office 2 months in advance to schedule this appointment.  You may see Sanda Klein, MD or one of the following Advanced Practice Providers on your designated Care Team: [place], [place] . Fabian Sharp, PA-C         Signed, Sanda Klein, MD  11/13/2018 9:47 AM    [facility] [address], [place], [place]  [address] Phone: [phone]; Fax: [phone]

## 2018-11-13 NOTE — Patient Instructions (Signed)
Medication Instructions:  No changes If you need a refill on your cardiac medications before your next appointment, please call your pharmacy.   Lab work: None ordered If you have labs (blood work) drawn today and your tests are completely normal, you will receive your results only by: Marland Kitchen MyChart Message (if you have MyChart) OR . A paper copy in the mail If you have any lab test that is abnormal or we need to change your treatment, we will call you to review the results.  Testing/Procedures: None ordered  Follow-Up: At [facility], you and your health needs are our priority.  As part of our continuing mission to provide you with exceptional heart care, we have created designated Provider Care Teams.  These Care Teams include your primary Cardiologist (physician) and Advanced Practice Providers (APPs -  Physician Assistants and Nurse Practitioners) who all work together to provide you with the care you need, when you need it. You will need a follow up appointment in 12 months.  Please call our office 2 months in advance to schedule this appointment.  You may see Sanda Klein, MD or one of the following Advanced Practice Providers on your designated Care Team: [place], [place] . Fabian Sharp, PA-C

## 2018-11-24 ENCOUNTER — Telehealth: Payer: Self-pay

## 2018-11-24 NOTE — Telephone Encounter (Signed)
    COVID-19 Pre-Screening Questions:  . In the past 7 to 10 days have you had a cough,  shortness of breath, headache, congestion, fever (100 or greater) body aches, chills, sore throat, or sudden loss of taste or sense of smell? NO . Have you been around anyone with known Covid 19. NO . Have you been around anyone who is awaiting Covid 19 test results in the past 7 to 10 days? NO . Have you been around anyone who has been exposed to Covid 19, or has mentioned symptoms of Covid 19 within the past 7 to 10 days? NO  If you have any concerns/questions about symptoms patients report during screening (either on the phone or at threshold). Contact the provider seeing the patient or DOD for further guidance.  If neither are available contact a member of the leadership team.        Patient was advised of visitor restrictions (no visitors allowed except if needed for care). Also advised patient to arrive to appointment wearing a mask if he/she does not have one a mask will be provided up entrance into the office. Patient verbalized understanding and all (if any) questions were answered.

## 2018-11-28 ENCOUNTER — Ambulatory Visit (INDEPENDENT_AMBULATORY_CARE_PROVIDER_SITE_OTHER): Payer: Medicare Other | Admitting: Pharmacist Clinician (PhC)/ Clinical Pharmacy Specialist

## 2018-11-28 ENCOUNTER — Other Ambulatory Visit: Payer: Self-pay

## 2018-11-28 DIAGNOSIS — I482 Chronic atrial fibrillation, unspecified: Secondary | ICD-10-CM | POA: Diagnosis not present

## 2018-11-28 DIAGNOSIS — Z7901 Long term (current) use of anticoagulants: Secondary | ICD-10-CM | POA: Diagnosis not present

## 2018-11-28 LAB — POCT INR: INR: 2.6 (ref 2.0–3.0)

## 2018-11-29 NOTE — Progress Notes (Signed)
FOLLOW UP  Assessment and Plan:   Atrial Fibrillation No concerns with excessive bleeding, no falls Continue medication: coumadin Continue follow up with a. Fib clinic Reminded to present to ER for any falls/head injury or concerning excessive bleeding  Hypertension Well controlled with current medications Monitor blood pressure at home; patient to call if consistently greater than 130/80 Continue DASH diet.   Reminder to go to the ER if any CP, SOB, nausea, dizziness, severe HA, changes vision/speech, left arm numbness and tingling and jaw pain.  Cholesterol Currently well controlled by lifestyle Continue low cholesterol diet and exercise.  Check lipid panel.   Abnormal glucose Discussed disease and risks Discussed diet/exercise, weight management   Vitamin D Def/ osteoporosis prevention Continue supplementation Check Vit D level  Continue diet and meds as discussed. Further disposition pending results of labs. Discussed med's effects and SE's.   Over 30 minutes of exam, counseling, chart review, and critical decision making was performed.   Future Appointments  Date Time Provider [place]  01/16/2019 10:15 AM [facility]  01/17/2019  7:25 AM [facility] DEVICE REMOTES [facility]  03/12/2019 10:00 AM [name], MD [facility] None  04/18/2019  7:25 AM [facility] DEVICE REMOTES [facility]  07/18/2019  7:25 AM [facility] DEVICE REMOTES [facility]  10/03/2019 11:15 AM Vicie Mutters, PA-C [facility] None  10/17/2019  7:25 AM [facility] DEVICE REMOTES [facility]  01/16/2020  7:25 AM [facility] DEVICE REMOTES [facility]    ----------------------------------------------------------------------------------------------------------------------  HPI 85 y.o. female  presents for 3 month follow up on hypertension, cholesterol, well controlled prediabetes and  vitamin D deficiency.   [facility], had chin and left neck mohs procedure done for basal cell.   She has a diagnosis of chronic A. Fib treated by coumadin which is managed at the a. Fib clinic.  Lab Results  Component Value Date   INR 2.6 11/28/2018   INR 3.1 (A) 11/13/2018   INR 2.2 10/10/2018   BMI is Body mass index is 25.27 kg/m., she has been working on diet and exercise. Wt Readings from Last 3 Encounters:  12/04/18 147 lb 3.2 oz (66.8 kg)  11/13/18 144 lb 3.2 oz (65.4 kg)  06/05/18 146 lb 3.2 oz (66.3 kg)   Her blood pressure has been controlled at home, today their BP is BP: 130/74  She does workout. She denies chest pain, shortness of breath, dizziness.   She is not on cholesterol medication and denies myalgias. Her cholesterol is at goal. The cholesterol last visit was:   Lab Results  Component Value Date   CHOL 156 09/06/2018   HDL 47 (L) 09/06/2018   LDLCALC 85 09/06/2018   TRIG 143 09/06/2018   CHOLHDL 3.3 09/06/2018    She has been working on diet and exercise for prediabetes, and denies increased appetite, nausea, polydipsia, polyuria and visual disturbances. Improvements to A1C have been noted at last 2 visits with normalized A1Cs. Last A1C in the office was:  Lab Results  Component Value Date   HGBA1C 5.6 09/06/2018   Patient is on Vitamin D supplement but remained below goal at the last visit:    Lab Results  Component Value Date   VD25OH 71 09/06/2018        Current Medications:  Current Outpatient Medications on File Prior to Visit  Medication Sig  . ALPRAZolam (XANAX) 1 MG tablet Take 1/2 to 1 tablet 1 - 2  x / day ONLY  if needed for Anxiety  & please try to limit to 5 days /week to avoid addiction  . B Complex-C (SUPER B COMPLEX PO) Take 1 tablet by mouth daily.  Marland Kitchen BIOTIN PO Take 10,000 mcg by mouth daily.   . Cholecalciferol (VITAMIN D3) 3000 units TABS Take 3,000 tablets by mouth daily.   . Cyanocobalamin  (VITAMIN B 12 PO) Take 1,000 mcg by mouth daily.   Marland Kitchen diltiazem (CARDIZEM CD) 180 MG 24 hr capsule TAKE 1 CAPSULE BY MOUTH EVERY DAY  . escitalopram (LEXAPRO) 10 MG tablet Take 1/2 to 1 tablet Daily for chronic anxiety  . furosemide (LASIX) 40 MG tablet Take 1 tablet (40 mg total) by mouth daily.  . IRON PO Take 65 mg by mouth daily.   Marland Kitchen MAGNESIUM PO Take 1 tablet by mouth daily.  . Multiple Vitamins-Minerals (ZINC PO) Take 1 tablet by mouth daily.  . nitroGLYCERIN (NITROSTAT) 0.4 MG SL tablet Place 0.4 mg under the tongue every 5 (five) minutes as needed for chest pain.  . potassium chloride SA (K-DUR) 20 MEQ tablet Take 1 tablet (20 mEq total) by mouth daily.  . pravastatin (PRAVACHOL) 20 MG tablet Take 1 tablet (20 mg total) by mouth daily at 6 PM.  . telmisartan (MICARDIS) 80 MG tablet TAKE ONE-HALF TO 1 TABLET BY MOUTH EVERY MORNING FOR BLOOD PRESSURE AND HEART  . traZODone (DESYREL) 150 MG tablet Take 1 tablet 1 hour before Bedtime  . warfarin (COUMADIN) 3 MG tablet TAKE 1 TO 1 AND 1/2 TABLET BY MOUTH TABLETS AS DIRECTED (Patient taking differently: Take 3-4.5 mg by mouth daily. )   No current facility-administered medications on file prior to visit.      Allergies:  Allergies  Allergen Reactions  . Latex Itching  . Ace Inhibitors Other (See Comments)    Unknown reaction  . Augmentin [Amoxicillin-Pot Clavulanate] Other (See Comments)  . Ciprofloxacin Other (See Comments)  . Levaquin [Levofloxacin In D5w] Other (See Comments)  . Zocor [Simvastatin] Other (See Comments)  . Acrylic Polymer [Carbomer] Itching  . Chocolate Other (See Comments)    migraine's   . Gabapentin Other (See Comments)    Unsteady gait      Medical History:  Past Medical History:  Diagnosis Date  . Atrial fib/flutter, transient   . Atrial fibrillation, chronic   . CHF (congestive heart failure) ([place]) 10/29/2009   Echo - EF >55%; normal LV size and systolic function; unable to assess diastolic fcn due to  E/A fusion, pulmonary vein flow pattern suggests elevated filling pressure; marked biatrail dilation, mild/mod tricuspid regurgitation; mod pulmonary htn; mild/mod mitral regurgitation; although echocardiographic features are incomplete findings suggest possible infiltrative cardiomyopathy (maybe amyloidosi  . Coronary artery disease 03/19/2002   R/P Cardiolite - EF 76%; nromal static and dynamic myocardial perfusion images; normal wall motion and endocardial thickening in all vascular territories  . Facial numbness 12/26/2008   carotid doppler - R and L ICAs 0-49% diameter reduction (velocities suggest low end of scale)  . Hypertension   . Pacemaker   . Peripheral neuropathy   . Skin cancer    s/p surgical removal.  . TIA (transient ischemic attack)    Family history- Reviewed and unchanged Social history- Reviewed and unchanged   Review of Systems:  Review of Systems  Constitutional: Negative for malaise/fatigue and weight loss.  HENT: Negative for hearing loss and tinnitus.   Eyes: Negative for blurred vision and double vision.  Respiratory: Negative for cough, shortness of breath  and wheezing.   Cardiovascular: Negative for chest pain, palpitations, orthopnea, claudication and leg swelling.  Gastrointestinal: Negative for abdominal pain, blood in stool, constipation, diarrhea, heartburn, melena, nausea and vomiting.  Genitourinary: Negative.        Intermittent urge incontinence  Musculoskeletal: Negative for joint pain and myalgias.  Skin: Negative for rash.  Neurological: Negative for dizziness, tingling, sensory change, weakness and headaches.  Endo/Heme/Allergies: Negative for polydipsia.  Psychiatric/Behavioral: Negative.   All other systems reviewed and are negative.   Physical Exam: BP 130/74   Pulse 93   Temp (!) 97.5 F (36.4 C)   Wt 147 lb 3.2 oz (66.8 kg)   SpO2 97%   BMI 25.27 kg/m  Wt Readings from Last 3 Encounters:  12/04/18 147 lb 3.2 oz (66.8 kg)   11/13/18 144 lb 3.2 oz (65.4 kg)  06/05/18 146 lb 3.2 oz (66.3 kg)   General Appearance: Well nourished, in no apparent distress. Eyes: PERRLA, EOMs, conjunctiva no swelling or erythema Sinuses: No Frontal/maxillary tenderness ENT/Mouth: Ext aud canals clear, TMs without erythema, bulging. No erythema, swelling, or exudate on post pharynx.  Tonsils not swollen or erythematous. Hearing normal.  Neck: Supple, thyroid normal.  Respiratory: Respiratory effort normal, BS equal bilaterally without rales, rhonchi, wheezing or stridor.  Cardio: Irregularly irregular. Brisk peripheral pulses without edema.  Abdomen: Soft, + BS.  Non tender, no guarding, rebound, hernias, masses. Lymphatics: Non tender without lymphadenopathy.  Musculoskeletal: Full ROM, 5/5 strength, Normal gait Skin: Well healing horizontal scar on chin and left neck. Warm, dry without rashes, lesions, ecchymosis.  Neuro: Cranial nerves intact. No cerebellar symptoms.  Psych: Awake and oriented X 3, normal affect, Insight and Judgment appropriate.    Vicie Mutters, PA-C 11:27 AM [facility]

## 2018-12-04 ENCOUNTER — Encounter: Payer: Self-pay | Admitting: Physician Assistant

## 2018-12-04 ENCOUNTER — Other Ambulatory Visit: Payer: Self-pay

## 2018-12-04 ENCOUNTER — Ambulatory Visit (INDEPENDENT_AMBULATORY_CARE_PROVIDER_SITE_OTHER): Payer: Medicare Other | Admitting: Physician Assistant

## 2018-12-04 VITALS — BP 130/74 | HR 93 | Temp 97.5°F | Wt 147.2 lb

## 2018-12-04 DIAGNOSIS — Z79899 Other long term (current) drug therapy: Secondary | ICD-10-CM

## 2018-12-04 DIAGNOSIS — I482 Chronic atrial fibrillation, unspecified: Secondary | ICD-10-CM | POA: Diagnosis not present

## 2018-12-04 DIAGNOSIS — I25119 Atherosclerotic heart disease of native coronary artery with unspecified angina pectoris: Secondary | ICD-10-CM

## 2018-12-04 DIAGNOSIS — F419 Anxiety disorder, unspecified: Secondary | ICD-10-CM

## 2018-12-04 DIAGNOSIS — I10 Essential (primary) hypertension: Secondary | ICD-10-CM | POA: Diagnosis not present

## 2018-12-04 DIAGNOSIS — Z7901 Long term (current) use of anticoagulants: Secondary | ICD-10-CM

## 2018-12-04 DIAGNOSIS — I5032 Chronic diastolic (congestive) heart failure: Secondary | ICD-10-CM | POA: Diagnosis not present

## 2018-12-04 DIAGNOSIS — I495 Sick sinus syndrome: Secondary | ICD-10-CM | POA: Diagnosis not present

## 2018-12-04 DIAGNOSIS — E782 Mixed hyperlipidemia: Secondary | ICD-10-CM

## 2018-12-04 NOTE — Patient Instructions (Addendum)
Check out vionic shoes for your feet  VARICOSE VEINS Varicose veins are veins that have become enlarged and twisted. CAUSES This condition is the result of valves in the veins not working properly. Valves in the veins help return blood from the leg to the heart. When your calf muscles squeeze, the blood moves up your leg then the valves close and this continues until the blood gets back to your heart.  If these valves are damaged, blood flows backwards and backs up into the veins in the leg near the skin OR if your are sitting/standing for a long time without using your calf muscles the blood will back up into the veins in your legs. This causes the veins to become larger. People who are on their feet a lot, sit a lot without walking (like on a plane, at a desk, or in a car), who are pregnant, or who are overweight are more likely to develop varicose veins. SYMPTOMS   Bulging, twisted-appearing, bluish veins, most commonly found on the legs.  Leg pain or a feeling of heaviness. These symptoms may be worse at the end of the day.  Leg swelling.  Skin color changes. DIAGNOSIS  Varicose veins can usually be diagnosed with an exam of your legs by your caregiver. He or she may recommend an ultrasound of your leg veins. TREATMENT  Most varicose veins can be treated at home. However, other treatments are available for people who have persistent symptoms or who want to treat the cosmetic appearance of the varicose veins. But this is only cosmetic and they will return if not properly treated. These include:  Laser treatment of very small varicose veins.  Medicine that is shot (injected) into the vein. This medicine hardens the walls of the vein and closes off the vein. This treatment is called sclerotherapy. Afterwards, you may need to wear clothing or bandages that apply pressure.  Surgery. HOME CARE INSTRUCTIONS   Do not stand or sit in one position for long periods of time. Do not sit with your  legs crossed. Rest with your legs raised during the day.  Your legs have to be higher than your heart so that gravity will force the valves to open, so please really elevate your legs.   Wear elastic stockings or support hose. Do not wear other tight, encircling garments around the legs, pelvis, or waist.  [employer]  has a wide variety of well priced compression stockings. [place], [place] [phone]  OR THERE ARE COPPER INFUSED COMPRESSION SOCKS AT [facility] OR [facility]  [employer] also has great cheap/afforable stockings or socks- the socks are easier to get on your feet  - can also get a jacob's donner that helps you put on the sock  Walk as much as possible to increase blood flow.  Raise the foot of your bed at night with 2-inch blocks.  If you get a cut in the skin over the vein and the vein bleeds, lie down with your leg raised and press on it with a clean cloth until the bleeding stops. Then place a bandage (dressing) on the cut. See your caregiver if it continues to bleed or needs stitches. SEEK MEDICAL CARE IF:   The skin around your ankle starts to break down.  You have pain, redness, tenderness, or hard swelling developing in your leg over a vein.  You are uncomfortable due to leg pain. Document Released: 01/27/2005 Document Revised: 07/12/2011 Document Reviewed: 06/15/2010 ExitCare  Patient Information 2014 [employer] [employer].  Know what a healthy weight is for you (roughly BMI <25) and aim to maintain this  Aim for 7+ servings of fruits and vegetables daily  65-80+ fluid ounces of water or unsweet tea for healthy kidneys  Limit to max 1 drink of alcohol per day; avoid smoking/tobacco  Limit animal fats in diet for cholesterol and heart health - choose grass fed whenever available  Avoid highly processed foods, and foods high in saturated/trans fats  Aim for low stress - take time to unwind and care for your mental health  Aim for 150 min of  moderate intensity exercise weekly for heart health, and weights twice weekly for bone health  Aim for 7-9 hours of sleep daily

## 2018-12-05 LAB — CBC WITH DIFFERENTIAL/PLATELET
Absolute Monocytes: 735 cells/uL (ref 200–950)
Basophils Absolute: 42 cells/uL (ref 0–200)
Basophils Relative: 0.6 %
Eosinophils Absolute: 119 cells/uL (ref 15–500)
Eosinophils Relative: 1.7 %
HCT: 39.2 % (ref 35.0–45.0)
Hemoglobin: 13.3 g/dL (ref 11.7–15.5)
Lymphs Abs: 2128 cells/uL (ref 850–3900)
MCH: 31 pg (ref 27.0–33.0)
MCHC: 33.9 g/dL (ref 32.0–36.0)
MCV: 91.4 fL (ref 80.0–100.0)
MPV: 11.1 fL (ref 7.5–12.5)
Monocytes Relative: 10.5 %
Neutro Abs: 3976 cells/uL (ref 1500–7800)
Neutrophils Relative %: 56.8 %
Platelets: 224 10*3/uL (ref 140–400)
RBC: 4.29 10*6/uL (ref 3.80–5.10)
RDW: 12 % (ref 11.0–15.0)
Total Lymphocyte: 30.4 %
WBC: 7 10*3/uL (ref 3.8–10.8)

## 2018-12-05 LAB — COMPLETE METABOLIC PANEL WITH GFR
AG Ratio: 1.6 (calc) (ref 1.0–2.5)
ALT: 13 U/L (ref 6–29)
AST: 17 U/L (ref 10–35)
Albumin: 4.2 g/dL (ref 3.6–5.1)
Alkaline phosphatase (APISO): 111 U/L (ref 37–153)
BUN: 17 mg/dL (ref 7–25)
CO2: 32 mmol/L (ref 20–32)
Calcium: 9.6 mg/dL (ref 8.6–10.4)
Chloride: 103 mmol/L (ref 98–110)
Creat: 0.72 mg/dL (ref 0.60–0.88)
GFR, Est African American: 86 mL/min/{1.73_m2} (ref >=60)
GFR, Est Non African American: 74 mL/min/{1.73_m2} (ref >=60)
Globulin: 2.6 g/dL (calc) (ref 1.9–3.7)
Glucose, Bld: 88 mg/dL (ref 65–99)
Potassium: 4.3 mmol/L (ref 3.5–5.3)
Sodium: 142 mmol/L (ref 135–146)
Total Bilirubin: 0.5 mg/dL (ref 0.2–1.2)
Total Protein: 6.8 g/dL (ref 6.1–8.1)

## 2018-12-05 LAB — LIPID PANEL
Cholesterol: 148 mg/dL (ref <200)
HDL: 41 mg/dL — ABNORMAL LOW (ref >=50)
LDL Cholesterol (Calc): 82 mg/dL (calc)
Non-HDL Cholesterol (Calc): 107 mg/dL (calc) (ref <130)
Total CHOL/HDL Ratio: 3.6 (calc) (ref <5.0)
Triglycerides: 153 mg/dL — ABNORMAL HIGH (ref <150)

## 2018-12-05 LAB — TSH: TSH: 1.28 mIU/L (ref 0.40–4.50)

## 2018-12-05 LAB — MAGNESIUM: Magnesium: 2.1 mg/dL (ref 1.5–2.5)

## 2018-12-11 ENCOUNTER — Other Ambulatory Visit: Payer: Self-pay | Admitting: Internal Medicine

## 2018-12-11 DIAGNOSIS — F419 Anxiety disorder, unspecified: Secondary | ICD-10-CM

## 2019-01-16 ENCOUNTER — Ambulatory Visit (INDEPENDENT_AMBULATORY_CARE_PROVIDER_SITE_OTHER): Payer: Medicare Other | Admitting: Pharmacist

## 2019-01-16 ENCOUNTER — Other Ambulatory Visit: Payer: Self-pay

## 2019-01-16 DIAGNOSIS — I482 Chronic atrial fibrillation, unspecified: Secondary | ICD-10-CM | POA: Diagnosis not present

## 2019-01-16 DIAGNOSIS — Z7901 Long term (current) use of anticoagulants: Secondary | ICD-10-CM

## 2019-01-16 LAB — POCT INR: INR: 2.3 (ref 2.0–3.0)

## 2019-01-17 ENCOUNTER — Ambulatory Visit (INDEPENDENT_AMBULATORY_CARE_PROVIDER_SITE_OTHER): Payer: Medicare Other | Admitting: *Deleted

## 2019-01-17 DIAGNOSIS — I495 Sick sinus syndrome: Secondary | ICD-10-CM

## 2019-01-17 LAB — CUP PACEART REMOTE DEVICE CHECK
Battery Impedance: 2208 Ohm
Battery Remaining Longevity: 31 mo
Battery Voltage: 2.76 V
Brady Statistic RV Percent Paced: 66 %
Date Time Interrogation Session: 20200916112108
Implantable Lead Implant Date: 20110428
Implantable Lead Implant Date: 20110428
Implantable Lead Location: 753859
Implantable Lead Location: 753860
Implantable Lead Model: 4092
Implantable Lead Model: 4592
Implantable Pulse Generator Implant Date: 20110428
Lead Channel Impedance Value: 67 Ohm
Lead Channel Impedance Value: 695 Ohm
Lead Channel Pacing Threshold Amplitude: 0.75 V
Lead Channel Pacing Threshold Pulse Width: 0.4 ms
Lead Channel Setting Pacing Amplitude: 2.5 V
Lead Channel Setting Pacing Pulse Width: 0.4 ms
Lead Channel Setting Sensing Sensitivity: 4 mV

## 2019-01-23 ENCOUNTER — Encounter: Payer: Self-pay | Admitting: Cardiology

## 2019-01-23 NOTE — Progress Notes (Signed)
Remote pacemaker transmission.   

## 2019-01-29 ENCOUNTER — Other Ambulatory Visit: Payer: Self-pay | Admitting: Physician Assistant

## 2019-01-29 DIAGNOSIS — F419 Anxiety disorder, unspecified: Secondary | ICD-10-CM

## 2019-02-06 ENCOUNTER — Other Ambulatory Visit: Payer: Self-pay | Admitting: Cardiovascular Disease

## 2019-02-28 ENCOUNTER — Other Ambulatory Visit: Payer: Self-pay

## 2019-02-28 ENCOUNTER — Ambulatory Visit (INDEPENDENT_AMBULATORY_CARE_PROVIDER_SITE_OTHER): Payer: Medicare Other | Admitting: Pharmacist

## 2019-02-28 DIAGNOSIS — I482 Chronic atrial fibrillation, unspecified: Secondary | ICD-10-CM | POA: Diagnosis not present

## 2019-02-28 DIAGNOSIS — Z7901 Long term (current) use of anticoagulants: Secondary | ICD-10-CM | POA: Diagnosis not present

## 2019-02-28 LAB — POCT INR: INR: 2.7 (ref 2.0–3.0)

## 2019-03-06 ENCOUNTER — Other Ambulatory Visit: Payer: Self-pay

## 2019-03-06 MED ORDER — DILTIAZEM HCL ER COATED BEADS 180 MG PO CP24: ORAL_CAPSULE | ORAL | 2 refills | Status: DC

## 2019-03-11 ENCOUNTER — Encounter: Payer: Self-pay | Admitting: Internal Medicine

## 2019-03-11 NOTE — Patient Instructions (Signed)
PLEASE STOP BIOTIN       ! ! !   ++++++++++++++++++++++++++++++++++++++++++++  Vit D   & Vit C 1,000 mg    are recommended to help protect   against the Covid-19 and other Corona viruses.    Also it's recommended  to take   Zinc 50 mg   to help  protect against the Covid-19   and best place to get  is also on [email or web address]  and don't pay more than 6-8 cents /pill !  ================================ Coronavirus (COVID-19) Are you at risk?  Are you at risk for the Coronavirus (COVID-19)?  To be considered HIGH RISK for Coronavirus (COVID-19), you have to meet the following criteria:  . Traveled to [place], [place], [place], [place] or [place]; or in the [place] to [place], [place], [place]  . or [place]; and have fever, cough, and shortness of breath within the last 2 weeks of travel OR . Been in close contact with a person diagnosed with COVID-19 within the last 2 weeks and have  . fever, cough,and shortness of breath .  . IF YOU DO NOT MEET THESE CRITERIA, YOU ARE CONSIDERED LOW RISK FOR COVID-19.  What to do if you are HIGH RISK for COVID-19?  Marland Kitchen If you are having a medical emergency, call 911. . Seek medical care right away. Before you go to a doctor's office, urgent care or emergency department, .  call ahead and tell them about your recent travel, contact with someone diagnosed with COVID-19  .  and your symptoms.  . You should receive instructions from your physician's office regarding next steps of care.  . When you arrive at healthcare provider, tell the healthcare staff immediately you have returned from  . visiting [place], [place], [place], [place] or [place]; or traveled in the [place] to [place], [place],  . [place] or [place] in the last two weeks or you have been in close contact with a person diagnosed with  . COVID-19 in the last 2 weeks.   . Tell the health care staff about your symptoms: fever, cough and shortness of  breath. . After you have been seen by a medical provider, you will be either: o Tested for (COVID-19) and discharged home on quarantine except to seek medical care if  o symptoms worsen, and asked to  - Stay home and avoid contact with others until you get your results (4-5 days)  - Avoid travel on public transportation if possible (such as bus, train, or airplane) or o Sent to the Emergency Department by EMS for evaluation, COVID-19 testing  and  o possible admission depending on your condition and test results.  What to do if you are LOW RISK for COVID-19?  Reduce your risk of any infection by using the same precautions used for avoiding the common cold or flu:  Marland Kitchen Wash your hands often with soap and warm water for at least 20 seconds.  If soap and water are not readily available,  . use an alcohol-based hand sanitizer with at least 60% alcohol.  . If coughing or sneezing, cover your mouth and nose by coughing or sneezing into the elbow areas of your shirt or coat, .  into a tissue or into your sleeve (not your hands). . Avoid shaking hands with others and consider head nods or verbal greetings only. . Avoid touching your eyes, nose, or mouth with unwashed hands.  . Avoid close contact with people who  are sick. . Avoid places or events with large numbers of people in one location, like concerts or sporting events. . Carefully consider travel plans you have or are making. . If you are planning any travel outside or inside the [place], visit the CDC's Travelers' Health webpage for the latest health notices. . If you have some symptoms but not all symptoms, continue to monitor at home and seek medical attention  . if your symptoms worsen. . If you are having a medical emergency, call 911.   . >>>>>>>>>>>>>>>>>>>>>>>>>>>>>>>>> . We Do NOT Approve of  [facility], [employer]  Our Patients  To Do Home Visits & We Do NOT Approve of [employer] > > > > > > > > > > > > >  > > > > > > > > > > > > > > > > > > > > > > > > > >  Preventive Care for Adults  A healthy lifestyle and preventive care can promote health and wellness. Preventive health guidelines for women include the following key practices.  A routine yearly physical is a good way to check with your health care provider about your health and preventive screening. It is a chance to share any concerns and updates on your health and to receive a thorough exam.  Visit your dentist for a routine exam and preventive care every 6 months. Brush your teeth twice a day and floss once a day. Good oral hygiene prevents tooth decay and gum disease.  The frequency of eye exams is based on your age, health, family medical history, use of contact lenses, and other factors. Follow your health care provider's recommendations for frequency of eye exams.  Eat a healthy diet. Foods like vegetables, fruits, whole grains, low-fat dairy products, and lean protein foods contain the nutrients you need without too many calories. Decrease your intake of foods high in solid fats, added sugars, and salt. Eat the right amount of calories for you. Get information about a proper diet from your health care provider, if necessary.  Regular physical exercise is one of the most important things you can do for your health. Most adults should get at least 150 minutes of moderate-intensity exercise (any activity that increases your heart rate and causes you to sweat) each week. In addition, most adults need muscle-strengthening exercises on 2 or more days a week.  Maintain a healthy weight. The body mass index (BMI) is a screening tool to identify possible weight problems. It provides an estimate of body fat based on height and weight. Your health care provider can find your BMI and can help you achieve or maintain a healthy weight. For adults 20 years and older:  A BMI below 18.5 is considered underweight.  A BMI of 18.5 to 24.9 is normal.  A  BMI of 25 to 29.9 is considered overweight.  A BMI of 30 and above is considered obese.  Maintain normal blood lipids and cholesterol levels by exercising and minimizing your intake of saturated fat. Eat a balanced diet with plenty of fruit and vegetables. If your lipid or cholesterol levels are high, you are over 50, or you are at high risk for heart disease, you may need your cholesterol levels checked more frequently. Ongoing high lipid and cholesterol levels should be treated with medicines if diet and exercise are not working.  If you smoke, find out from your health care provider how to quit. If you do not use tobacco, do not  start.  Lung cancer screening is recommended for adults aged 50-80 years who are at high risk for developing lung cancer because of a history of smoking. A yearly low-dose CT scan of the lungs is recommended for people who have at least a 30-pack-year history of smoking and are a current smoker or have quit within the past 15 years. A pack year of smoking is smoking an average of 1 pack of cigarettes a day for 1 year (for example: 1 pack a day for 30 years or 2 packs a day for 15 years). Yearly screening should continue until the smoker has stopped smoking for at least 15 years. Yearly screening should be stopped for people who develop a health problem that would prevent them from having lung cancer treatment.  Avoid use of street drugs. Do not share needles with anyone. Ask for help if you need support or instructions about stopping the use of drugs.  High blood pressure causes heart disease and increases the risk of stroke.  Ongoing high blood pressure should be treated with medicines if weight loss and exercise do not work.  If you are 55-3 years old, ask your health care provider if you should take aspirin to prevent strokes.  Diabetes screening involves taking a blood sample to check your fasting blood sugar level. This should be done once every 3 years, after age  57, if you are within normal weight and without risk factors for diabetes. Testing should be considered at a younger age or be carried out more frequently if you are overweight and have at least 1 risk factor for diabetes.  Breast cancer screening is essential preventive care for women. You should practice "breast self-awareness." This means understanding the normal appearance and feel of your breasts and may include breast self-examination. Any changes detected, no matter how small, should be reported to a health care provider. Women in their 3s and 30s should have a clinical breast exam (CBE) by a health care provider as part of a regular health exam every 1 to 3 years. After age 17, women should have a CBE every year. Starting at age 78, women should consider having a mammogram (breast X-ray test) every year. Women who have a family history of breast cancer should talk to their health care provider about genetic screening. Women at a high risk of breast cancer should talk to their health care providers about having an MRI and a mammogram every year.  Breast cancer gene (BRCA)-related cancer risk assessment is recommended for women who have family members with BRCA-related cancers. BRCA-related cancers include breast, ovarian, tubal, and peritoneal cancers. Having family members with these cancers may be associated with an increased risk for harmful changes (mutations) in the breast cancer genes BRCA1 and BRCA2. Results of the assessment will determine the need for genetic counseling and BRCA1 and BRCA2 testing.  Routine pelvic exams to screen for cancer are no longer recommended for nonpregnant women who are considered low risk for cancer of the pelvic organs (ovaries, uterus, and vagina) and who do not have symptoms. Ask your health care provider if a screening pelvic exam is right for you.  If you have had past treatment for cervical cancer or a condition that could lead to cancer, you need Pap tests  and screening for cancer for at least 20 years after your treatment. If Pap tests have been discontinued, your risk factors (such as having a new sexual partner) need to be reassessed to determine if screening should  be resumed. Some women have medical problems that increase the chance of getting cervical cancer. In these cases, your health care provider may recommend more frequent screening and Pap tests.    Colorectal cancer can be detected and often prevented. Most routine colorectal cancer screening begins at the age of 37 years and continues through age 5 years. However, your health care provider may recommend screening at an earlier age if you have risk factors for colon cancer. On a yearly basis, your health care provider may provide home test kits to check for hidden blood in the stool. Use of a small camera at the end of a tube, to directly examine the colon (sigmoidoscopy or colonoscopy), can detect the earliest forms of colorectal cancer. Talk to your health care provider about this at age 64, when routine screening begins.  Direct exam of the colon should be repeated every 5-10 years through age 54 years, unless early forms of pre-cancerous polyps or small growths are found.  Osteoporosis is a disease in which the bones lose minerals and strength with aging. This can result in serious bone fractures or breaks. The risk of osteoporosis can be identified using a bone density scan. Women ages 24 years and over and women at risk for fractures or osteoporosis should discuss screening with their health care providers. Ask your health care provider whether you should take a calcium supplement or vitamin D to reduce the rate of osteoporosis.  Menopause can be associated with physical symptoms and risks. Hormone replacement therapy is available to decrease symptoms and risks. You should talk to your health care provider about whether hormone replacement therapy is right for you.  Use sunscreen. Apply  sunscreen liberally and repeatedly throughout the day. You should seek shade when your shadow is shorter than you. Protect yourself by wearing long sleeves, pants, a wide-brimmed hat, and sunglasses year round, whenever you are outdoors.  Once a month, do a whole body skin exam, using a mirror to look at the skin on your back. Tell your health care provider of new moles, moles that have irregular borders, moles that are larger than a pencil eraser, or moles that have changed in shape or color.  Stay current with required vaccines (immunizations).  Influenza vaccine. All adults should be immunized every year.  Tetanus, diphtheria, and acellular pertussis (Td, Tdap) vaccine. Pregnant women should receive 1 dose of Tdap vaccine during each pregnancy. The dose should be obtained regardless of the length of time since the last dose. Immunization is preferred during the 27th-36th week of gestation. An adult who has not previously received Tdap or who does not know her vaccine status should receive 1 dose of Tdap. This initial dose should be followed by tetanus and diphtheria toxoids (Td) booster doses every 10 years. Adults with an unknown or incomplete history of completing a 3-dose immunization series with Td-containing vaccines should begin or complete a primary immunization series including a Tdap dose. Adults should receive a Td booster every 10 years.    Zoster vaccine. One dose is recommended for adults aged 13 years or older unless certain conditions are present.    Pneumococcal 13-valent conjugate (PCV13) vaccine. When indicated, a person who is uncertain of her immunization history and has no record of immunization should receive the PCV13 vaccine. An adult aged 71 years or older who has certain medical conditions and has not been previously immunized should receive 1 dose of PCV13 vaccine. This PCV13 should be followed with a dose of  pneumococcal polysaccharide (PPSV23) vaccine. The PPSV23  vaccine dose should be obtained at least 1 or more year(s) after the dose of PCV13 vaccine. An adult aged 69 years or older who has certain medical conditions and previously received 1 or more doses of PPSV23 vaccine should receive 1 dose of PCV13. The PCV13 vaccine dose should be obtained 1 or more years after the last PPSV23 vaccine dose.    Pneumococcal polysaccharide (PPSV23) vaccine. When PCV13 is also indicated, PCV13 should be obtained first. All adults aged 3 years and older should be immunized. An adult younger than age 54 years who has certain medical conditions should be immunized. Any person who resides in a nursing home or long-term care facility should be immunized. An adult smoker should be immunized. People with an immunocompromised condition and certain other conditions should receive both PCV13 and PPSV23 vaccines. People with human immunodeficiency virus (HIV) infection should be immunized as soon as possible after diagnosis. Immunization during chemotherapy or radiation therapy should be avoided. Routine use of PPSV23 vaccine is not recommended for American Indians, [place]atives, or people younger than 65 years unless there are medical conditions that require PPSV23 vaccine. When indicated, people who have unknown immunization and have no record of immunization should receive PPSV23 vaccine. One-time revaccination 5 years after the first dose of PPSV23 is recommended for people aged 19-64 years who have chronic kidney failure, nephrotic syndrome, asplenia, or immunocompromised conditions. People who received 1-2 doses of PPSV23 before age 40 years should receive another dose of PPSV23 vaccine at age 46 years or later if at least 5 years have passed since the previous dose. Doses of PPSV23 are not needed for people immunized with PPSV23 at or after age 18 years.   Preventive Services / Frequency  Ages 35 years and over  Blood pressure check.  Lipid and cholesterol check.  Lung  cancer screening. / Every year if you are aged 45-80 years and have a 30-pack-year history of smoking and currently smoke or have quit within the past 15 years. Yearly screening is stopped once you have quit smoking for at least 15 years or develop a health problem that would prevent you from having lung cancer treatment.  Clinical breast exam.** / Every year after age 65 years.   BRCA-related cancer risk assessment.** / For women who have family members with a BRCA-related cancer (breast, ovarian, tubal, or peritoneal cancers).  Mammogram.** / Every year beginning at age 71 years and continuing for as long as you are in good health. Consult with your health care provider.  Pap test.** / Every 3 years starting at age 13 years through age 76 or 46 years with 3 consecutive normal Pap tests. Testing can be stopped between 65 and 70 years with 3 consecutive normal Pap tests and no abnormal Pap or HPV tests in the past 10 years.  Fecal occult blood test (FOBT) of stool. / Every year beginning at age 26 years and continuing until age 53 years. You may not need to do this test if you get a colonoscopy every 10 years.  Flexible sigmoidoscopy or colonoscopy.** / Every 5 years for a flexible sigmoidoscopy or every 10 years for a colonoscopy beginning at age 20 years and continuing until age 39 years.  Hepatitis C blood test.** / For all people born from [birth year] through 1963 and any individual with known risks for hepatitis C.  Osteoporosis screening.** / A one-time screening for women ages 25 years and over and women at  risk for fractures or osteoporosis.  Skin self-exam. / Monthly.  Influenza vaccine. / Every year.  Tetanus, diphtheria, and acellular pertussis (Tdap/Td) vaccine.** / 1 dose of Td every 10 years.  Zoster vaccine.** / 1 dose for adults aged 21 years or older.  Pneumococcal 13-valent conjugate (PCV13) vaccine.** / Consult your health care provider.  Pneumococcal polysaccharide (PPSV23)  vaccine.** / 1 dose for all adults aged 4 years and older. Screening for abdominal aortic aneurysm (AAA)  by ultrasound is recommended for people who have history of high blood pressure or who are current or former smokers. ++++++++++++++++++++ Recommend Adult Low Dose Aspirin or  coated  Aspirin 81 mg daily  To reduce risk of Colon Cancer 40 %,  Skin Cancer 26 % ,  Melanoma 46%  and  Pancreatic cancer 60% ++++++++++++++++++++ Vitamin D goal  is between 70-100.  Please make sure that you are taking your Vitamin D as directed.  It is very important as a natural anti-inflammatory  helping hair, skin, and nails, as well as reducing stroke and heart attack risk.  It helps your bones and helps with mood. It also decreases numerous cancer risks so please take it as directed.  Low Vit D is associated with a 200-300% higher risk for CANCER  and 200-300% higher risk for HEART   ATTACK  &  STROKE.   .....................................Marland Kitchen It is also associated with higher death rate at younger ages,  autoimmune diseases like Rheumatoid arthritis, Lupus, Multiple Sclerosis.    Also many other serious conditions, like depression, Alzheimer's Dementia, infertility, muscle aches, fatigue, fibromyalgia - just to name a few. ++++++++++++++++++ Recommend the book "The END of DIETING" by Dr Excell Seltzer  & the book "The END of DIABETES " by Dr Excell Seltzer At [email or web address] - get book & Audio CD's    Being diabetic has a  300% increased risk for heart attack, stroke, cancer, and alzheimer- type vascular dementia. It is very important that you work harder with diet by avoiding all foods that are white. Avoid white rice (brown & wild rice is OK), white potatoes (sweetpotatoes in moderation is OK), White bread or wheat bread or anything made out of white flour like bagels, donuts, rolls, buns, biscuits, cakes, pastries, cookies, pizza crust, and pasta (made from white flour & egg whites) - vegetarian pasta or  spinach or wheat pasta is OK. Multigrain breads like Arnold's or [employer], or multigrain sandwich thins or flatbreads.  Diet, exercise and weight loss can reverse and cure diabetes in the early stages.  Diet, exercise and weight loss is very important in the control and prevention of complications of diabetes which affects every system in your body, ie. Brain - dementia/stroke, eyes - glaucoma/blindness, heart - heart attack/heart failure, kidneys - dialysis, stomach - gastric paralysis, intestines - malabsorption, nerves - severe painful neuritis, circulation - gangrene & loss of a leg(s), and finally cancer and Alzheimers.    I recommend avoid fried & greasy foods,  sweets/candy, white rice (brown or wild rice or Quinoa is OK), white potatoes (sweet potatoes are OK) - anything made from white flour - bagels, doughnuts, rolls, buns, biscuits,white and wheat breads, pizza crust and traditional pasta made of white flour & egg white(vegetarian pasta or spinach or wheat pasta is OK).  Multi-grain bread is OK - like multi-grain flat bread or sandwich thins. Avoid alcohol in excess. Exercise is also important.    Eat all the vegetables you want - avoid meat, especially red meat  and dairy - especially cheese.  Cheese is the most concentrated form of trans-fats which is the worst thing to clog up our arteries. Veggie cheese is OK which can be found in the fresh produce section at [employer] or [employer] or [employer]  +++++++++++++++++++ DASH Eating Plan  DASH stands for "Dietary Approaches to Stop Hypertension."   The DASH eating plan is a healthy eating plan that has been shown to reduce high blood pressure (hypertension). Additional health benefits may include reducing the risk of type 2 diabetes mellitus, heart disease, and stroke. The DASH eating plan may also help with weight loss. WHAT DO I NEED TO KNOW ABOUT THE DASH EATING PLAN? For the DASH eating plan, you will follow these general  guidelines:  Choose foods with a percent daily value for sodium of less than 5% (as listed on the food label).  Use salt-free seasonings or herbs instead of table salt or sea salt.  Check with your health care provider or pharmacist before using salt substitutes.  Eat lower-sodium products, often labeled as "lower sodium" or "no salt added."  Eat fresh foods.  Eat more vegetables, fruits, and low-fat dairy products.  Choose whole grains. Look for the word "whole" as the first word in the ingredient list.  Choose fish   Limit sweets, desserts, sugars, and sugary drinks.  Choose heart-healthy fats.  Eat veggie cheese   Eat more home-cooked food and less restaurant, buffet, and fast food.  Limit fried foods.  Cook foods using methods other than frying.  Limit canned vegetables. If you do use them, rinse them well to decrease the sodium.  When eating at a restaurant, ask that your food be prepared with less salt, or no salt if possible.                      WHAT FOODS CAN I EAT? Read Dr Fara Olden Fuhrman's books on The End of Dieting & The End of Diabetes  Grains Whole grain or whole wheat bread. Brown rice. Whole grain or whole wheat pasta. Quinoa, bulgur, and whole grain cereals. Low-sodium cereals. Corn or whole wheat flour tortillas. Whole grain cornbread. Whole grain crackers. Low-sodium crackers.  Vegetables Fresh or frozen vegetables (raw, steamed, roasted, or grilled). Low-sodium or reduced-sodium tomato and vegetable juices. Low-sodium or reduced-sodium tomato sauce and paste. Low-sodium or reduced-sodium canned vegetables.   Fruits All fresh, canned (in natural juice), or frozen fruits.  Protein Products  All fish and seafood.  Dried beans, peas, or lentils. Unsalted nuts and seeds. Unsalted canned beans.  Dairy Low-fat dairy products, such as skim or 1% milk, 2% or reduced-fat cheeses, low-fat ricotta or cottage cheese, or plain low-fat yogurt. Low-sodium or  reduced-sodium cheeses.  Fats and Oils Tub margarines without trans fats. Light or reduced-fat mayonnaise and salad dressings (reduced sodium). Avocado. Safflower, olive, or canola oils. Natural peanut or almond butter.  Other Unsalted popcorn and pretzels. The items listed above may not be a complete list of recommended foods or beverages. Contact your dietitian for more options.  +++++++++++++++  WHAT FOODS ARE NOT RECOMMENDED? Grains/ White flour or wheat flour White bread. White pasta. White rice. Refined cornbread. Bagels and croissants. Crackers that contain trans fat.  Vegetables  Creamed or fried vegetables. Vegetables in a . Regular canned vegetables. Regular canned tomato sauce and paste. Regular tomato and vegetable juices.  Fruits Dried fruits. Canned fruit in light or heavy syrup. Fruit juice.  Meat and Other Protein Products  Meat in general - RED meat & White meat.  Fatty cuts of meat. Ribs, chicken wings, all processed meats as bacon, sausage, bologna, salami, fatback, hot dogs, bratwurst and packaged luncheon meats.  Dairy Whole or 2% milk, cream, half-and-half, and cream cheese. Whole-fat or sweetened yogurt. Full-fat cheeses or blue cheese. Non-dairy creamers and whipped toppings. Processed cheese, cheese spreads, or cheese curds.  Condiments Onion and garlic salt, seasoned salt, table salt, and sea salt. Canned and packaged gravies. Worcestershire sauce. Tartar sauce. Barbecue sauce. Teriyaki sauce. Soy sauce, including reduced sodium. Steak sauce. Fish sauce. Oyster sauce. Cocktail sauce. Horseradish. Ketchup and mustard. Meat flavorings and tenderizers. Bouillon cubes. Hot sauce. Tabasco sauce. Marinades. Taco seasonings. Relishes.  Fats and Oils Butter, stick margarine, lard, shortening and bacon fat. Coconut, palm kernel, or palm oils. Regular salad dressings.  Pickles and olives. Salted popcorn and pretzels.  The items listed above may not be a complete  list of foods and beverages to avoid.

## 2019-03-11 NOTE — Progress Notes (Signed)
Annual Screening/Preventative Visit & Comprehensive Evaluation &  Examination     This very nice 85 y.o. [name] presents for a Screening /Preventative Visit & comprehensive evaluation and management of multiple medical co-morbidities.  Patient has been followed for HTN, cAfib, HLD, T2_NIDDM  Prediabetes  and Vitamin D Deficiency. Patient's GERD is controlled with diet.       HTN predates circa 1998.  In 1993 & 2007 she had  Heart Caths x 2 finding non-obstructive CAD . In 2003 and Apr 2019, she had negative Myoview. 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 She's been on Coumadin since (CHADsVASc 8) & is followed at [facility]. In 2007, she had a PPM implanted for SSS. She's followed closely by Dr Sallyanne Kuster. Patient's BP has been controlled at home and patient denies any cardiac symptoms as chest pain, palpitations, shortness of breath, dizziness or ankle swelling. Today's BP is at goal - 134/74.      Patient's hyperlipidemia is controlled with diet and medications. Patient denies myalgias or other medication SE's. Last lipids were at goal:  Lab Results  Component Value Date   CHOL 148 12/04/2018   HDL 41 (L) 12/04/2018   LDLCALC 82 12/04/2018   TRIG 153 (H) 12/04/2018   CHOLHDL 3.6 12/04/2018      Patient has hx/o prediabetes & Insulin Resistance(A1c 5.9%/Insulin elev86 /2011) and patient denies reactive hypoglycemic symptoms, visual blurring, diabetic polys or paresthesias. Last A1c was Normal & at goal:  Lab Results  Component Value Date   HGBA1C 5.6 09/06/2018      Finally, patient has history of Vitamin D Deficiency ("13" / 2008) and last Vitamin D was at goal:  Lab Results  Component Value Date   VD25OH 71 09/06/2018   Current Outpatient Medications on File Prior to Visit  Medication Sig  . ALPRAZolam (XANAX) 1 MG tablet TAKE 1/2 TO 1 TABLET BY MOUTH EVERY DAY TO TWICE DAILY AS NEEDED FOR ANXIETY AND PLEASE TRY TO LIMIT TO 5  DAYS A WEEK TO AVOID ADDICTION  . B Complex-C (SUPER B COMPLEX PO) Take 1 tablet by mouth daily.  Marland Kitchen BIOTIN PO Take 10,000 mcg by mouth daily.   . Cholecalciferol (VITAMIN D3) 3000 units TABS Take 3,000 tablets by mouth daily.   . Cyanocobalamin (VITAMIN B 12 PO) Take 1,000 mcg by mouth daily.   Marland Kitchen diltiazem (CARDIZEM CD) 180 MG 24 hr capsule TAKE 1 CAPSULE BY MOUTH EVERY DAY  . escitalopram (LEXAPRO) 10 MG tablet Take 1/2 to 1 tablet Daily for chronic anxiety  . furosemide (LASIX) 40 MG tablet Take 1 tablet (40 mg total) by mouth daily.  . IRON PO Take 65 mg by mouth daily.   Marland Kitchen MAGNESIUM PO Take 1 tablet by mouth daily.  . Multiple Vitamins-Minerals (ZINC PO) Take 1 tablet by mouth daily.  . nitroGLYCERIN (NITROSTAT) 0.4 MG SL tablet Place 0.4 mg under the tongue every 5 (five) minutes as needed for chest pain.  . potassium chloride SA (K-DUR) 20 MEQ tablet Take 1 tablet (20 mEq total) by mouth daily.  Marland Kitchen telmisartan (MICARDIS) 80 MG tablet TAKE ONE-HALF TO 1 TABLET BY MOUTH EVERY MORNING FOR BLOOD PRESSURE AND HEART  . traZODone (DESYREL) 150 MG tablet Take 1 tablet 1 hour before Bedtime  . warfarin (COUMADIN) 3 MG tablet TAKE 1 TO 1 AND 1/2 TABLET BY MOUTH AS DIRECTED  . zinc gluconate 50 MG tablet Take 50 mg by mouth daily.  No current facility-administered medications on file prior to visit.    Allergies  Allergen Reactions  . Latex Itching  . Ace Inhibitors Other (See Comments)    Unknown reaction  . Augmentin [Amoxicillin-Pot Clavulanate] Other (See Comments)  . Ciprofloxacin Other (See Comments)  . Levaquin [Levofloxacin In D5w] Other (See Comments)  . Zocor [Simvastatin] Other (See Comments)  . Acrylic Polymer [Carbomer] Itching  . Chocolate Other (See Comments)    migraine's   . Gabapentin Other (See Comments)    Unsteady gait    Past Medical History:  Diagnosis Date  . Atrial fib/flutter, transient   . Atrial fibrillation, chronic ([place])   . CHF (congestive heart  failure) ([place]) 10/29/2009   Echo - EF >55%; normal LV size and systolic function; unable to assess diastolic fcn due to E/A fusion, pulmonary vein flow pattern suggests elevated filling pressure; marked biatrail dilation, mild/mod tricuspid regurgitation; mod pulmonary htn; mild/mod mitral regurgitation; although echocardiographic features are incomplete findings suggest possible infiltrative cardiomyopathy (maybe amyloidosi  . Coronary artery disease 03/19/2002   R/P Cardiolite - EF 76%; nromal static and dynamic myocardial perfusion images; normal wall motion and endocardial thickening in all vascular territories  . Facial numbness 12/26/2008   carotid doppler - R and L ICAs 0-49% diameter reduction (velocities suggest low end of scale)  . Hypertension   . Pacemaker   . Peripheral neuropathy   . Skin cancer    s/p surgical removal.  . TIA (transient ischemic attack)    Health Maintenance  Topic Date Due  . DEXA SCAN  08/21/1994  . TETANUS/TDAP  03/18/2024  . INFLUENZA VACCINE  Completed  . PNA vac Low Risk Adult  Completed   Immunization History  Administered Date(s) Administered  . DT 03/19/2015  . Influenza Split 05/04/2011  . Influenza, High Dose Seasonal PF 03/19/2014, 12/23/2015, 02/03/2017, 12/26/2018  . Influenza,inj,quad, With Preservative 05/21/2013  . Influenza-Unspecified 02/04/2015, 02/03/2017  . Pneumococcal Conjugate-13 03/19/2014  . Pneumococcal Polysaccharide-23 05/04/2011  . Pneumococcal-Unspecified 05/03/2001  . Td 05/04/2003   Last Colon - 05/11/20120 - Dr Deatra Ina - Negative - Recc no F/U due to age.  Last MGM -   Past Surgical History:  Procedure Laterality Date  . ABDOMINAL HYSTERECTOMY    . APPENDECTOMY    . CARDIAC CATHETERIZATION  08/06/2005   minimal coronary disease predominant RCA; no significant atherosclerosis; new onset sick sinus syndrome and atrial flutter w/ ventricular response, controlled on med therapy; systemic HTN, normal renal arteries  .  CARDIOVERSION  11/19/2009   successful DCCV from AF to sinus type rhythm  . CHOLECYSTECTOMY    . Skin cancer resection     Family History  Problem Relation Age of Onset  . Heart disease Mother   . Diabetes Mother   . Heart attack Father   . Heart disease Father   . Cirrhosis Brother    Social History   Tobacco Use  . Smoking status: Never Smoker  . Smokeless tobacco: Never Used  Substance Use Topics  . Alcohol use: No  . Drug use: No    ROS Constitutional: Denies fever, chills, weight loss/gain, headaches, insomnia,  night sweats, and change in appetite. Does c/o fatigue. Eyes: Denies redness, blurred vision, diplopia, discharge, itchy, watery eyes.  ENT: Denies discharge, congestion, post nasal drip, epistaxis, sore throat, earache, hearing loss, dental pain, Tinnitus, Vertigo, Sinus pain, snoring.  Cardio: Denies chest pain, palpitations, irregular heartbeat, syncope, dyspnea, diaphoresis, orthopnea, PND, claudication, edema Respiratory: denies cough, dyspnea, DOE, pleurisy,  hoarseness, laryngitis, wheezing.  Gastrointestinal: Denies dysphagia, heartburn, reflux, water brash, pain, cramps, nausea, vomiting, bloating, diarrhea, constipation, hematemesis, melena, hematochezia, jaundice, hemorrhoids Genitourinary: Denies dysuria, frequency, urgency, nocturia, hesitancy, discharge, hematuria, flank pain Breast: Breast lumps, nipple discharge, bleeding.  Musculoskeletal: Denies arthralgia, myalgia, stiffness, Jt. Swelling, pain, limp, and strain/sprain. Denies falls. Skin: Denies puritis, rash, hives, warts, acne, eczema, changing in skin lesion Neuro: No weakness, tremor, incoordination, spasms, paresthesia, pain Psychiatric: Denies confusion, memory loss, sensory loss. Denies Depression. Endocrine: Denies change in weight, skin, hair change, nocturia, and paresthesia, diabetic polys, visual blurring, hyper / hypo glycemic episodes.  Heme/Lymph: No excessive bleeding, bruising,  enlarged lymph nodes.  Physical Exam  BP 134/74   Pulse 72   Temp 97.7 F (36.5 C)   Resp 16   Ht 5\' 4"  (1.626 m)   Wt 145 lb 9.6 oz (66 kg)   BMI 24.99 kg/m   General Appearance: Well nourished, well groomed and in no apparent distress.  Eyes: PERRLA, EOMs, conjunctiva no swelling or erythema, normal fundi and vessels. Sinuses: No frontal/maxillary tenderness ENT/Mouth: EACs patent / TMs  nl. Nares clear without erythema, swelling, mucoid exudates. Oral hygiene is good. No erythema, swelling, or exudate. Tongue normal, non-obstructing. Tonsils not swollen or erythematous. Hearing normal.  Neck: Supple, thyroid not palpable. No bruits, nodes or JVD. Respiratory: Respiratory effort normal.  BS equal and clear bilateral without rales, rhonci, wheezing or stridor. Cardio: Heart sounds are normal with regular rate and rhythm and no murmurs, rubs or gallops. Peripheral pulses are normal and equal bilaterally without edema. No aortic or femoral bruits. Chest: symmetric with normal excursions and percussion. Breasts: Symmetric, without lumps, nipple discharge, retractions, or fibrocystic changes.  Abdomen: Flat, soft with bowel sounds active. Nontender, no guarding, rebound, hernias, masses, or organomegaly.  Lymphatics: Non tender without lymphadenopathy.  Genitourinary:  Musculoskeletal: Full ROM all peripheral extremities, joint stability, 5/5 strength, and normal gait. Skin: Warm and dry without rashes, lesions, cyanosis, clubbing or  ecchymosis.  Neuro: Cranial nerves intact, reflexes equal bilaterally. Normal muscle tone, no cerebellar symptoms. Sensation intact.  Pysch: Alert and oriented X 3, normal affect, Insight and Judgment appropriate.   Assessment and Plan  1. Annual Preventative Screening Examination  2. Essential hypertension  - EKG 12-Lead - Urinalysis, Routine w reflex microscopic - Microalbumin / Creatinine Urine Ratio - CBC with Diff - COMPLETE METABOLIC PANEL  WITH GFR - Magnesium - TSH  3. Hyperlipidemia, mixed  - EKG 12-Lead - Lipid Profile - TSH  4. Abnormal glucose  - EKG 12-Lead - Hemoglobin A1c ([facility]) - Insulin, random  5. Vitamin D deficiency  - Vitamin D (25 hydroxy)  6. Insulin resistance  - Hemoglobin A1c ([facility]) - Insulin, random  7. Chronic atrial fibrillation ([facility])  - EKG 12-Lead - TSH  8. SSS (sick sinus syndrome) ([facility])  - EKG 12-Lead  9. Pacemaker  - EKG 12-Lead  10. Screening for colorectal cancer  - POC Hemoccult Bld/Stl  11. Screening for ischemic heart disease  - EKG 12-Lead  12. FHx: heart disease  - EKG 12-Lead  13. Long term current use of anticoagulant therapy   14. Medication management  - EKG 12-Lead - Urinalysis, Routine w reflex microscopic - Microalbumin / Creatinine Urine Ratio - CBC with Diff - COMPLETE METABOLIC PANEL WITH GFR - Magnesium - Lipid Profile - TSH - Hemoglobin A1c ([facility]) - Insulin, random - Vitamin D (25 hydroxy        Patient was counseled in prudent  diet to achieve/maintain BMI less than 25 for weight control, BP monitoring, regular exercise and medications. Discussed med's effects and SE's. Screening labs and tests as requested with regular follow-up as recommended. Over 40 minutes of exam, counseling, chart review and high complex critical decision making was performed.   Kirtland Bouchard, MD

## 2019-03-12 ENCOUNTER — Other Ambulatory Visit: Payer: Self-pay

## 2019-03-12 ENCOUNTER — Other Ambulatory Visit: Payer: Self-pay | Admitting: Physician Assistant

## 2019-03-12 ENCOUNTER — Ambulatory Visit (INDEPENDENT_AMBULATORY_CARE_PROVIDER_SITE_OTHER): Payer: Medicare Other | Admitting: Internal Medicine

## 2019-03-12 ENCOUNTER — Encounter: Payer: Self-pay | Admitting: Internal Medicine

## 2019-03-12 VITALS — BP 134/74 | HR 72 | Temp 97.7°F | Resp 16 | Ht 64.0 in | Wt 145.6 lb

## 2019-03-12 DIAGNOSIS — Z0000 Encounter for general adult medical examination without abnormal findings: Secondary | ICD-10-CM | POA: Diagnosis not present

## 2019-03-12 DIAGNOSIS — Z950 Presence of cardiac pacemaker: Secondary | ICD-10-CM

## 2019-03-12 DIAGNOSIS — Z8249 Family history of ischemic heart disease and other diseases of the circulatory system: Secondary | ICD-10-CM | POA: Diagnosis not present

## 2019-03-12 DIAGNOSIS — E8881 Metabolic syndrome: Secondary | ICD-10-CM

## 2019-03-12 DIAGNOSIS — I482 Chronic atrial fibrillation, unspecified: Secondary | ICD-10-CM

## 2019-03-12 DIAGNOSIS — R7309 Other abnormal glucose: Secondary | ICD-10-CM

## 2019-03-12 DIAGNOSIS — Z136 Encounter for screening for cardiovascular disorders: Secondary | ICD-10-CM

## 2019-03-12 DIAGNOSIS — I2510 Atherosclerotic heart disease of native coronary artery without angina pectoris: Secondary | ICD-10-CM

## 2019-03-12 DIAGNOSIS — I495 Sick sinus syndrome: Secondary | ICD-10-CM

## 2019-03-12 DIAGNOSIS — E782 Mixed hyperlipidemia: Secondary | ICD-10-CM

## 2019-03-12 DIAGNOSIS — Z79899 Other long term (current) drug therapy: Secondary | ICD-10-CM

## 2019-03-12 DIAGNOSIS — F419 Anxiety disorder, unspecified: Secondary | ICD-10-CM

## 2019-03-12 DIAGNOSIS — E559 Vitamin D deficiency, unspecified: Secondary | ICD-10-CM

## 2019-03-12 DIAGNOSIS — G459 Transient cerebral ischemic attack, unspecified: Secondary | ICD-10-CM

## 2019-03-12 DIAGNOSIS — E88819 Insulin resistance, unspecified: Secondary | ICD-10-CM

## 2019-03-12 DIAGNOSIS — I10 Essential (primary) hypertension: Secondary | ICD-10-CM

## 2019-03-12 DIAGNOSIS — Z7901 Long term (current) use of anticoagulants: Secondary | ICD-10-CM

## 2019-03-12 DIAGNOSIS — N3000 Acute cystitis without hematuria: Secondary | ICD-10-CM

## 2019-03-12 DIAGNOSIS — Z1211 Encounter for screening for malignant neoplasm of colon: Secondary | ICD-10-CM

## 2019-03-12 DIAGNOSIS — Z0001 Encounter for general adult medical examination with abnormal findings: Secondary | ICD-10-CM

## 2019-03-13 ENCOUNTER — Other Ambulatory Visit: Payer: Self-pay | Admitting: Internal Medicine

## 2019-03-13 DIAGNOSIS — N3000 Acute cystitis without hematuria: Secondary | ICD-10-CM

## 2019-03-13 LAB — URINALYSIS, ROUTINE W REFLEX MICROSCOPIC
Bilirubin Urine: NEGATIVE
Glucose, UA: NEGATIVE
Hgb urine dipstick: NEGATIVE
Hyaline Cast: NONE SEEN /LPF
Ketones, ur: NEGATIVE
Nitrite: POSITIVE — AB
Specific Gravity, Urine: 1.019 (ref 1.001–1.03)
WBC, UA: >=60 /HPF — AB (ref 0–5)
pH: 8 (ref 5.0–8.0)

## 2019-03-13 LAB — COMPLETE METABOLIC PANEL WITH GFR
AG Ratio: 1.7 (calc) (ref 1.0–2.5)
ALT: 15 U/L (ref 6–29)
AST: 16 U/L (ref 10–35)
Albumin: 4.2 g/dL (ref 3.6–5.1)
Alkaline phosphatase (APISO): 103 U/L (ref 37–153)
BUN: 20 mg/dL (ref 7–25)
CO2: 34 mmol/L — ABNORMAL HIGH (ref 20–32)
Calcium: 9.3 mg/dL (ref 8.6–10.4)
Chloride: 102 mmol/L (ref 98–110)
Creat: 0.7 mg/dL (ref 0.60–0.88)
GFR, Est African American: 89 mL/min/{1.73_m2} (ref >=60)
GFR, Est Non African American: 77 mL/min/{1.73_m2} (ref >=60)
Globulin: 2.5 g/dL (calc) (ref 1.9–3.7)
Glucose, Bld: 72 mg/dL (ref 65–99)
Potassium: 4.3 mmol/L (ref 3.5–5.3)
Sodium: 140 mmol/L (ref 135–146)
Total Bilirubin: 0.6 mg/dL (ref 0.2–1.2)
Total Protein: 6.7 g/dL (ref 6.1–8.1)

## 2019-03-13 LAB — CBC WITH DIFFERENTIAL/PLATELET
Absolute Monocytes: 824 cells/uL (ref 200–950)
Basophils Absolute: 62 cells/uL (ref 0–200)
Basophils Relative: 0.8 %
Eosinophils Absolute: 92 cells/uL (ref 15–500)
Eosinophils Relative: 1.2 %
HCT: 44.2 % (ref 35.0–45.0)
Hemoglobin: 14.6 g/dL (ref 11.7–15.5)
Lymphs Abs: 3042 cells/uL (ref 850–3900)
MCH: 30.3 pg (ref 27.0–33.0)
MCHC: 33 g/dL (ref 32.0–36.0)
MCV: 91.7 fL (ref 80.0–100.0)
MPV: 10.9 fL (ref 7.5–12.5)
Monocytes Relative: 10.7 %
Neutro Abs: 3681 cells/uL (ref 1500–7800)
Neutrophils Relative %: 47.8 %
Platelets: 227 10*3/uL (ref 140–400)
RBC: 4.82 10*6/uL (ref 3.80–5.10)
RDW: 11.9 % (ref 11.0–15.0)
Total Lymphocyte: 39.5 %
WBC: 7.7 10*3/uL (ref 3.8–10.8)

## 2019-03-13 LAB — LIPID PANEL
Cholesterol: 169 mg/dL (ref <200)
HDL: 41 mg/dL — ABNORMAL LOW (ref >=50)
LDL Cholesterol (Calc): 104 mg/dL (calc) — ABNORMAL HIGH
Non-HDL Cholesterol (Calc): 128 mg/dL (calc) (ref <130)
Total CHOL/HDL Ratio: 4.1 (calc) (ref <5.0)
Triglycerides: 140 mg/dL (ref <150)

## 2019-03-13 LAB — INSULIN, RANDOM: Insulin: 7.2 u[IU]/mL

## 2019-03-13 LAB — TSH: TSH: 1.35 mIU/L (ref 0.40–4.50)

## 2019-03-13 LAB — HEMOGLOBIN A1C
Hgb A1c MFr Bld: 5.7 % of total Hgb — ABNORMAL HIGH (ref <5.7)
Mean Plasma Glucose: 117 (calc)
eAG (mmol/L): 6.5 (calc)

## 2019-03-13 LAB — VITAMIN D 25 HYDROXY (VIT D DEFICIENCY, FRACTURES): Vit D, 25-Hydroxy: 53 ng/mL (ref 30–100)

## 2019-03-13 LAB — MAGNESIUM: Magnesium: 2 mg/dL (ref 1.5–2.5)

## 2019-03-13 LAB — MICROALBUMIN / CREATININE URINE RATIO
Creatinine, Urine: 68 mg/dL (ref 20–275)
Microalb Creat Ratio: 28 mcg/mg creat (ref <30)
Microalb, Ur: 1.9 mg/dL

## 2019-03-13 NOTE — Addendum Note (Signed)
Addended by: Eulis Canner on: 03/13/2019 01:22 PM   Modules accepted: Orders

## 2019-03-15 ENCOUNTER — Other Ambulatory Visit: Payer: Self-pay | Admitting: Internal Medicine

## 2019-03-15 DIAGNOSIS — N3000 Acute cystitis without hematuria: Secondary | ICD-10-CM

## 2019-03-15 LAB — URINE CULTURE
MICRO NUMBER:: 1085576
SPECIMEN QUALITY:: ADEQUATE

## 2019-03-15 MED ORDER — SULFAMETHOXAZOLE-TRIMETHOPRIM 800-160 MG PO TABS: ORAL_TABLET | ORAL | 0 refills | Status: DC

## 2019-03-26 ENCOUNTER — Other Ambulatory Visit: Payer: Self-pay | Admitting: Internal Medicine

## 2019-03-26 DIAGNOSIS — I10 Essential (primary) hypertension: Secondary | ICD-10-CM

## 2019-03-26 MED ORDER — TELMISARTAN 40 MG PO TABS: ORAL_TABLET | ORAL | 3 refills | Status: DC

## 2019-03-28 ENCOUNTER — Other Ambulatory Visit: Payer: Self-pay | Admitting: Internal Medicine

## 2019-04-10 ENCOUNTER — Other Ambulatory Visit: Payer: Self-pay | Admitting: *Deleted

## 2019-04-10 MED ORDER — POTASSIUM CHLORIDE CRYS ER 20 MEQ PO TBCR: EXTENDED_RELEASE_TABLET | ORAL | 3 refills | Status: DC

## 2019-04-10 MED ORDER — DILTIAZEM HCL ER COATED BEADS 180 MG PO CP24: ORAL_CAPSULE | ORAL | 2 refills | Status: DC

## 2019-04-10 NOTE — Telephone Encounter (Signed)
Rx has been sent to the pharmacy electronically. ° °

## 2019-04-11 ENCOUNTER — Ambulatory Visit (INDEPENDENT_AMBULATORY_CARE_PROVIDER_SITE_OTHER): Payer: Medicare Other | Admitting: Pharmacist Clinician (PhC)/ Clinical Pharmacy Specialist

## 2019-04-11 ENCOUNTER — Other Ambulatory Visit: Payer: Self-pay

## 2019-04-11 ENCOUNTER — Other Ambulatory Visit: Payer: Self-pay | Admitting: Pharmacist

## 2019-04-11 DIAGNOSIS — Z7901 Long term (current) use of anticoagulants: Secondary | ICD-10-CM | POA: Diagnosis not present

## 2019-04-11 DIAGNOSIS — I482 Chronic atrial fibrillation, unspecified: Secondary | ICD-10-CM

## 2019-04-11 LAB — POCT INR: INR: 2.8 (ref 2.0–3.0)

## 2019-04-11 MED ORDER — WARFARIN SODIUM 3 MG PO TABS: ORAL_TABLET | ORAL | 1 refills | Status: DC

## 2019-04-11 NOTE — Patient Instructions (Signed)
Continue taking 1.5 tablets daily except 1 tablet each Tuesday and Thursday.  Repeat INR in 6 weeks.  Call if you get any new antibiotics [phone] (Yaakov Saindon/Raquel)

## 2019-04-17 ENCOUNTER — Other Ambulatory Visit: Payer: Self-pay

## 2019-04-17 ENCOUNTER — Ambulatory Visit (INDEPENDENT_AMBULATORY_CARE_PROVIDER_SITE_OTHER): Payer: Medicare Other

## 2019-04-17 DIAGNOSIS — N3000 Acute cystitis without hematuria: Secondary | ICD-10-CM | POA: Diagnosis not present

## 2019-04-17 NOTE — Progress Notes (Signed)
Patient reports she feels better & has completed the ABX. Pt understands that we will call with RESULTS.

## 2019-04-18 ENCOUNTER — Other Ambulatory Visit: Payer: Self-pay | Admitting: *Deleted

## 2019-04-18 ENCOUNTER — Ambulatory Visit (INDEPENDENT_AMBULATORY_CARE_PROVIDER_SITE_OTHER): Payer: Medicare Other | Admitting: *Deleted

## 2019-04-18 DIAGNOSIS — I495 Sick sinus syndrome: Secondary | ICD-10-CM

## 2019-04-18 DIAGNOSIS — Z7901 Long term (current) use of anticoagulants: Secondary | ICD-10-CM

## 2019-04-18 DIAGNOSIS — Z1212 Encounter for screening for malignant neoplasm of rectum: Secondary | ICD-10-CM

## 2019-04-18 DIAGNOSIS — Z1211 Encounter for screening for malignant neoplasm of colon: Secondary | ICD-10-CM

## 2019-04-18 DIAGNOSIS — I482 Chronic atrial fibrillation, unspecified: Secondary | ICD-10-CM

## 2019-04-18 LAB — CUP PACEART REMOTE DEVICE CHECK
Battery Impedance: 2300 Ohm
Battery Remaining Longevity: 30 mo
Battery Voltage: 2.76 V
Brady Statistic RV Percent Paced: 69 %
Date Time Interrogation Session: 20201216100519
Implantable Lead Implant Date: 20110428
Implantable Lead Implant Date: 20110428
Implantable Lead Location: 753859
Implantable Lead Location: 753860
Implantable Lead Model: 4092
Implantable Lead Model: 4592
Implantable Pulse Generator Implant Date: 20110428
Lead Channel Impedance Value: 67 Ohm
Lead Channel Impedance Value: 711 Ohm
Lead Channel Pacing Threshold Amplitude: 0.75 V
Lead Channel Pacing Threshold Pulse Width: 0.4 ms
Lead Channel Setting Pacing Amplitude: 2.5 V
Lead Channel Setting Pacing Pulse Width: 0.4 ms
Lead Channel Setting Sensing Sensitivity: 4 mV

## 2019-04-18 LAB — POC HEMOCCULT BLD/STL (HOME/3-CARD/SCREEN)
Card #2 Fecal Occult Blod, POC: NEGATIVE
Card #3 Fecal Occult Blood, POC: NEGATIVE
Fecal Occult Blood, POC: NEGATIVE

## 2019-04-19 ENCOUNTER — Other Ambulatory Visit: Payer: Self-pay | Admitting: Internal Medicine

## 2019-04-19 DIAGNOSIS — N3000 Acute cystitis without hematuria: Secondary | ICD-10-CM

## 2019-04-19 LAB — URINE CULTURE
MICRO NUMBER:: 1201953
SPECIMEN QUALITY:: ADEQUATE

## 2019-04-19 LAB — URINALYSIS, ROUTINE W REFLEX MICROSCOPIC
Bacteria, UA: NONE SEEN /HPF
Bilirubin Urine: NEGATIVE
Glucose, UA: NEGATIVE
Hgb urine dipstick: NEGATIVE
Hyaline Cast: NONE SEEN /LPF
Ketones, ur: NEGATIVE
Nitrite: NEGATIVE
Protein, ur: NEGATIVE
Specific Gravity, Urine: 1.02 (ref 1.001–1.03)
pH: 7 (ref 5.0–8.0)

## 2019-04-19 MED ORDER — NITROFURANTOIN MONOHYD MACRO 100 MG PO CAPS: ORAL_CAPSULE | ORAL | 0 refills | Status: DC

## 2019-04-23 DIAGNOSIS — Z1212 Encounter for screening for malignant neoplasm of rectum: Secondary | ICD-10-CM | POA: Diagnosis not present

## 2019-04-23 DIAGNOSIS — Z1211 Encounter for screening for malignant neoplasm of colon: Secondary | ICD-10-CM | POA: Diagnosis not present

## 2019-05-09 NOTE — Progress Notes (Signed)
PPM Remote  

## 2019-05-21 ENCOUNTER — Other Ambulatory Visit: Payer: Self-pay

## 2019-05-21 ENCOUNTER — Ambulatory Visit (INDEPENDENT_AMBULATORY_CARE_PROVIDER_SITE_OTHER): Payer: Medicare Other

## 2019-05-21 VITALS — BP 142/88 | HR 70 | Temp 97.7°F | Wt 146.0 lb

## 2019-05-21 DIAGNOSIS — Z79899 Other long term (current) drug therapy: Secondary | ICD-10-CM

## 2019-05-21 NOTE — Progress Notes (Signed)
PT reports for U/C & UA Orders were entered in by the provider. Vitals entered as well Pt reports she has completed her ABX & feels much better today.  Patient was also given the number to the health dept for [place] information as patient had requested this info [phone].

## 2019-05-21 NOTE — Addendum Note (Signed)
Addended by: Elsie Amis D on: 05/21/2019 09:59 AM   Modules accepted: Orders

## 2019-05-21 NOTE — Addendum Note (Signed)
Addended by: Eulis Canner on: 05/21/2019 10:04 AM   Modules accepted: Orders

## 2019-05-22 LAB — URINALYSIS, ROUTINE W REFLEX MICROSCOPIC
Bilirubin Urine: NEGATIVE
Glucose, UA: NEGATIVE
Hgb urine dipstick: NEGATIVE
Hyaline Cast: NONE SEEN /LPF
Ketones, ur: NEGATIVE
Nitrite: NEGATIVE
Protein, ur: NEGATIVE
Specific Gravity, Urine: 1.024 (ref 1.001–1.03)
pH: 5.5 (ref 5.0–8.0)

## 2019-05-22 LAB — URINE CULTURE
MICRO NUMBER:: 10053551
SPECIMEN QUALITY:: ADEQUATE

## 2019-05-23 ENCOUNTER — Encounter (INDEPENDENT_AMBULATORY_CARE_PROVIDER_SITE_OTHER): Payer: Self-pay

## 2019-05-23 ENCOUNTER — Ambulatory Visit (INDEPENDENT_AMBULATORY_CARE_PROVIDER_SITE_OTHER): Payer: Medicare Other | Admitting: Pharmacist Clinician (PhC)/ Clinical Pharmacy Specialist

## 2019-05-23 ENCOUNTER — Encounter: Payer: Self-pay | Admitting: Pharmacist Clinician (PhC)/ Clinical Pharmacy Specialist

## 2019-05-23 ENCOUNTER — Other Ambulatory Visit: Payer: Self-pay

## 2019-05-23 DIAGNOSIS — Z7901 Long term (current) use of anticoagulants: Secondary | ICD-10-CM | POA: Diagnosis not present

## 2019-05-23 DIAGNOSIS — I482 Chronic atrial fibrillation, unspecified: Secondary | ICD-10-CM | POA: Diagnosis not present

## 2019-05-23 LAB — POCT INR: INR: 2 (ref 2.0–3.0)

## 2019-06-11 ENCOUNTER — Other Ambulatory Visit: Payer: Self-pay | Admitting: Internal Medicine

## 2019-06-11 DIAGNOSIS — F419 Anxiety disorder, unspecified: Secondary | ICD-10-CM

## 2019-06-15 NOTE — Progress Notes (Signed)
FOLLOW UP  Assessment and Plan:   Chronic atrial Fibrillation (HCC) No concerns with excessive bleeding, no falls Continue medication: coumadin Continue follow up with a. Fib clinic Reminded to present to ER for any falls/head injury or concerning excessive bleeding  CAD Follows with cardiology Dr. Sallyanne Kuster Control blood pressure, cholesterol, glucose, increase exercise.   Sick sinus syndrome [facility]) S/p pacemaker, monitor  Hypertension Well controlled with current medications Monitor blood pressure at home; patient to call if consistently greater than 130/80 Continue DASH diet.   Reminder to go to the ER if any CP, SOB, nausea, dizziness, severe HA, changes vision/speech, left arm numbness and tingling and jaw pain.  Cholesterol Currently well controlled by lifestyle Continue low cholesterol diet and exercise.  Check lipid panel.   Abnormal glucose Discussed disease and risks Discussed diet/exercise, weight management   Vitamin D Def/ osteoporosis prevention Continue supplementation, approaching goal of 60 Defer vitamin D level  Depression/anxiety Lifestyle discussed: diet/exerise, sleep hygiene, stress management, hydration New guidelines suggest the benzodiazepines are best short term, with prolonged use they lead to physical and psychological dependence. In addition, evidence suggest that for insomnia the effectiveness wanes in 4 weeks and the risks out weight their benefits. Use of these agents have been associated with dementia, falls, motor vehicle accidents and physical addiction. Decreasing these medication have been proven to show improvements in cognition, alertness, decrease of falls and daytime sedation.  We will start a slow taper, symptoms of withdrawal include, insomnia, anxiety, irritability, sweating and stomach or intestinal symptoms like diarrhea or nausea.  Discussed trazodone 75-150 mg at night PRN for sleep    Continue diet and meds as discussed.  Further disposition pending results of labs. Discussed med's effects and SE's.   Over 30 minutes of exam, counseling, chart review, and critical decision making was performed.   Future Appointments  Date Time Provider [place]  07/04/2019  9:30 AM [facility]  07/09/2019  1:45 PM [facility] P[facility]  07/18/2019  7:25 AM [facility] DEVICE REMOTES [facility]  09/25/2019 10:30 AM [name], MD [facility] None  10/17/2019  7:25 AM [facility] DEVICE REMOTES [facility]  12/31/2019 10:00 AM Vicie Mutters, PA-C [facility] None  01/16/2020  7:25 AM [facility] DEVICE REMOTES [facility]  04/08/2020 11:00 AM [name], MD [facility] None    ----------------------------------------------------------------------------------------------------------------------  HPI 86 y.o. female  presents for 3 month follow up on hypertension, cholesterol, well controlled prediabetes and vitamin D deficiency.   She [facility], had chin and left neck mohs procedure done for basal cell. Has another procedure scheduled tomorrow for R neck lesion.   She has hx of major depression with anxiety, recently in remission and off of medication excepting xanax 0.5 mg at night for sleep, rare use during the day. She is prescribed lexapro and trazodone but reports hasn't been taking.   She has a diagnosis of chronic A. Fib treated by coumadin which is managed at the a. Fib clinic.  Lab Results  Component Value Date   INR 2.0 05/23/2019   INR 2.8 04/11/2019   INR 2.7 02/28/2019   BMI is Body mass index is 25.23 kg/m., she has been working on diet and exercise. Wt Readings from Last 3 Encounters:  06/18/19 147 lb (66.7 kg)  05/21/19 146 lb (66.2 kg)  04/17/19 146 lb (66.2 kg)   In 2007, she had a PPM implanted for SSS. She has hx of caths  demonstrating non-obstructive CAD, negative  Myoview in 2019. She has well compensated dCHF, last EF in 2019 65-70%. 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 She has been on Coumadin since (CHADsVASc 8) and is followed at [facility].  She is followed closely by Dr Sallyanne Kuster.    Her blood pressure has been controlled at home, today their BP is BP: 138/78  She does workout. She denies chest pain, shortness of breath, dizziness.   She is not on cholesterol medication, intolerant of several statins, per Dr. Sallyanne Kuster LDL <100 appropriate. Her cholesterol is at goal. The cholesterol last visit was:   Lab Results  Component Value Date   CHOL 169 03/12/2019   HDL 41 (L) 03/12/2019   LDLCALC 104 (H) 03/12/2019   TRIG 140 03/12/2019   CHOLHDL 4.1 03/12/2019    She has been working on diet and exercise for prediabetes, and denies increased appetite, nausea, polydipsia, polyuria and visual disturbances. Improvements to A1C have been noted at last 2 visits with normalized A1Cs. Last A1C in the office was:  Lab Results  Component Value Date   HGBA1C 5.7 (H) 03/12/2019    Last GFR:  Lab Results  Component Value Date   GFRNONAA 77 03/12/2019   Patient is on Vitamin D supplement and approaching goal at the last visit:    Lab Results  Component Value Date   VD25OH 53 03/12/2019        Current Medications:  Current Outpatient Medications on File Prior to Visit  Medication Sig  . ALPRAZolam (XANAX) 1 MG tablet Take 1/2 - 1 tablet 1 - 2 x /day ONLY if needed for Anxiety Attack &  limit to 5 days /week to avoid Addiction & Dementia  . Ascorbic Acid (VITAMIN C) 1000 MG tablet Take 2,000 mg by mouth daily.  . B Complex-C (SUPER B COMPLEX PO) Take 1 tablet by mouth daily.  Marland Kitchen BIOTIN PO Take 10,000 mcg by mouth daily.   . Cholecalciferol (VITAMIN D3) 3000 units TABS Take 3,000 tablets by mouth daily.   . Cyanocobalamin (VITAMIN B 12 PO) Take 1,000 mcg by mouth daily.   Marland Kitchen diltiazem  (CARDIZEM CD) 180 MG 24 hr capsule TAKE 1 CAPSULE BY MOUTH EVERY DAY  . escitalopram (LEXAPRO) 10 MG tablet Take 1/2 to 1 tablet Daily for chronic anxiety  . furosemide (LASIX) 40 MG tablet Take 1 tablet (40 mg total) by mouth daily.  . IRON PO Take 65 mg by mouth daily.   Marland Kitchen MAGNESIUM PO Take 1 tablet by mouth daily.  . Multiple Vitamin (MULTIVITAMIN) tablet Take 1 tablet by mouth daily.  . nitroGLYCERIN (NITROSTAT) 0.4 MG SL tablet Place 0.4 mg under the tongue every 5 (five) minutes as needed for chest pain.  . potassium chloride SA (KLOR-CON) 20 MEQ tablet Take 1 tablet daily for potassium replacement.  Marland Kitchen telmisartan (MICARDIS) 40 MG tablet Take 1 tablet Daily for BP & Heart  . traZODone (DESYREL) 150 MG tablet Take 1 tablet 1 hour before Bedtime  . warfarin (COUMADIN) 3 MG tablet TAKE 1 TO 1 AND 1/2 TABLET BY MOUTH AS DIRECTED   No current facility-administered medications on file prior to visit.     Allergies:  Allergies  Allergen Reactions  . Latex Itching  . Ace Inhibitors Other (See Comments)    Unknown reaction  . Augmentin [Amoxicillin-Pot Clavulanate] Other (See Comments)  . Ciprofloxacin Other (See Comments)  . Levaquin [Levofloxacin In D5w] Other (  See Comments)  . Zocor [Simvastatin] Other (See Comments)  . Acrylic Polymer [Carbomer] Itching  . Chocolate Other (See Comments)    migraine's   . Gabapentin Other (See Comments)    Unsteady gait      Medical History:  Past Medical History:  Diagnosis Date  . Atrial fib/flutter, transient   . Atrial fibrillation, chronic ([place])   . CHF (congestive heart failure) ([place]) 10/29/2009   Echo - EF >55%; normal LV size and systolic function; unable to assess diastolic fcn due to E/A fusion, pulmonary vein flow pattern suggests elevated filling pressure; marked biatrail dilation, mild/mod tricuspid regurgitation; mod pulmonary htn; mild/mod mitral regurgitation; although echocardiographic features are incomplete findings suggest  possible infiltrative cardiomyopathy (maybe amyloidosi  . Coronary artery disease 03/19/2002   R/P Cardiolite - EF 76%; nromal static and dynamic myocardial perfusion images; normal wall motion and endocardial thickening in all vascular territories  . Facial numbness 12/26/2008   carotid doppler - R and L ICAs 0-49% diameter reduction (velocities suggest low end of scale)  . Hypertension   . Pacemaker   . Peripheral neuropathy   . Skin cancer    s/p surgical removal.  . TIA (transient ischemic attack)    Family history- Reviewed and unchanged Social history- Reviewed and unchanged   Review of Systems:  Review of Systems  Constitutional: Negative for malaise/fatigue and weight loss.  HENT: Negative for hearing loss and tinnitus.   Eyes: Negative for blurred vision and double vision.  Respiratory: Negative for cough, shortness of breath and wheezing.   Cardiovascular: Negative for chest pain, palpitations, orthopnea, claudication and leg swelling.  Gastrointestinal: Negative for abdominal pain, blood in stool, constipation, diarrhea, heartburn, melena, nausea and vomiting.  Genitourinary: Negative.        Intermittent urge incontinence  Musculoskeletal: Positive for joint pain (foot pain at night). Negative for myalgias.  Skin: Negative for rash.  Neurological: Negative for dizziness, tingling, sensory change, weakness and headaches.  Endo/Heme/Allergies: Negative for polydipsia.  Psychiatric/Behavioral: Negative for depression. The patient has insomnia. The patient is not nervous/anxious.   All other systems reviewed and are negative.   Physical Exam: BP 138/78   Pulse 73   Temp 97.7 F (36.5 C)   Ht 5\' 4"  (1.626 m)   Wt 147 lb (66.7 kg)   SpO2 98%   BMI 25.23 kg/m  Wt Readings from Last 3 Encounters:  06/18/19 147 lb (66.7 kg)  05/21/19 146 lb (66.2 kg)  04/17/19 146 lb (66.2 kg)   General Appearance: Well nourished, in no apparent distress. Eyes: PERRLA, EOMs,  conjunctiva no swelling or erythema Sinuses: No Frontal/maxillary tenderness ENT/Mouth: Ext aud canals clear, TMs without erythema, bulging. No erythema, swelling, or exudate on post pharynx.  Tonsils not swollen or erythematous. Hearing normal.  Neck: Supple, thyroid normal.  Respiratory: Respiratory effort normal, BS equal bilaterally without rales, rhonchi, wheezing or stridor.  Cardio: Irregularly irregular. Brisk peripheral pulses without edema.  Abdomen: Soft, + BS.  Non tender, no guarding, rebound, hernias, masses. Lymphatics: Non tender without lymphadenopathy.  Musculoskeletal: Full ROM, 5/5 strength, Normal gait Skin: Well healing horizontal scar on chin and left neck. Erythemarous lesion to R neck. Warm, dry without rashes, lesions, ecchymosis.  Neuro: Cranial nerves intact. No cerebellar symptoms.  Psych: Awake and oriented X 3, normal affect, Insight and Judgment appropriate.    Izora Ribas, NP 10:58 AM [facility]

## 2019-06-16 ENCOUNTER — Ambulatory Visit: Payer: Medicare Other | Attending: Internal Medicine

## 2019-06-16 DIAGNOSIS — Z23 Encounter for immunization: Secondary | ICD-10-CM | POA: Insufficient documentation

## 2019-06-16 NOTE — Progress Notes (Signed)
   Covid-19 Vaccination Clinic  Name:  MIYABI PORGES    MRN: [id number] DOB: January 19, 1928  06/16/2019  Ms. Mahowald was observed post Covid-19 immunization for 15 minutes without incidence. She was provided with Vaccine Information Sheet and instruction to access the V-Safe system.   Ms. Mcatee was instructed to call 911 with any severe reactions post vaccine: Marland Kitchen Difficulty breathing  . Swelling of your face and throat  . A fast heartbeat  . A bad rash all over your body  . Dizziness and weakness    Immunizations Administered    Name Date Dose VIS Date Route   Pfizer COVID-19 Vaccine 06/16/2019  2:16 PM 0.3 mL 04/13/2019 Intramuscular   Manufacturer: [place]   Lot: [id number]   [place]: [id number]

## 2019-06-18 ENCOUNTER — Other Ambulatory Visit: Payer: Self-pay

## 2019-06-18 ENCOUNTER — Encounter: Payer: Self-pay | Admitting: Adult Health

## 2019-06-18 ENCOUNTER — Ambulatory Visit (INDEPENDENT_AMBULATORY_CARE_PROVIDER_SITE_OTHER): Payer: Medicare Other | Admitting: Adult Health

## 2019-06-18 VITALS — BP 138/78 | HR 73 | Temp 97.7°F | Ht 64.0 in | Wt 147.0 lb

## 2019-06-18 DIAGNOSIS — R7309 Other abnormal glucose: Secondary | ICD-10-CM

## 2019-06-18 DIAGNOSIS — I5032 Chronic diastolic (congestive) heart failure: Secondary | ICD-10-CM | POA: Diagnosis not present

## 2019-06-18 DIAGNOSIS — I482 Chronic atrial fibrillation, unspecified: Secondary | ICD-10-CM | POA: Diagnosis not present

## 2019-06-18 DIAGNOSIS — I2510 Atherosclerotic heart disease of native coronary artery without angina pectoris: Secondary | ICD-10-CM

## 2019-06-18 DIAGNOSIS — I10 Essential (primary) hypertension: Secondary | ICD-10-CM

## 2019-06-18 DIAGNOSIS — F3341 Major depressive disorder, recurrent, in partial remission: Secondary | ICD-10-CM | POA: Insufficient documentation

## 2019-06-18 DIAGNOSIS — F419 Anxiety disorder, unspecified: Secondary | ICD-10-CM

## 2019-06-18 DIAGNOSIS — I495 Sick sinus syndrome: Secondary | ICD-10-CM

## 2019-06-18 DIAGNOSIS — E559 Vitamin D deficiency, unspecified: Secondary | ICD-10-CM

## 2019-06-18 DIAGNOSIS — E782 Mixed hyperlipidemia: Secondary | ICD-10-CM

## 2019-06-18 DIAGNOSIS — G4700 Insomnia, unspecified: Secondary | ICD-10-CM

## 2019-06-18 DIAGNOSIS — E663 Overweight: Secondary | ICD-10-CM

## 2019-06-18 DIAGNOSIS — Z79899 Other long term (current) drug therapy: Secondary | ICD-10-CM

## 2019-06-18 DIAGNOSIS — Z7901 Long term (current) use of anticoagulants: Secondary | ICD-10-CM

## 2019-06-18 NOTE — Patient Instructions (Signed)
  Try over the counter arthritis cream - blue emu or any brand - at night for neuropathy pains in feet  Please check to see if you are taking lexapro (esitalopram) at home - if not, call so we can take off of med list  Please cut back to avoid using xanax for sleep except on rare occasions  [place] guidelines suggest the benzodiazepines are best short term, with prolonged use they lead to physical and psychological dependence. In addition, evidence suggest that for insomnia the effectiveness wanes in 4 weeks and the risks out weight their benefits. Use of these agents have been associated with dementia, falls, motor vehicle accidents and physical addiction. Decreasing these medication have been proven to show improvements in cognition, alertness, decrease of falls and daytime sedation.   We will start a slow taper, symptoms of withdrawal include, insomnia, anxiety, irritability, sweating and stomach or intestinal symptoms like diarrhea or nausea.    Try taking trazodone, half to 1 tablet, 1-2 hours prior to bedtime instead of xanax/alprazolam for sleep (non-addictive, safe to take daily)

## 2019-06-19 LAB — COMPLETE METABOLIC PANEL WITH GFR
AG Ratio: 1.6 (calc) (ref 1.0–2.5)
ALT: 15 U/L (ref 6–29)
AST: 18 U/L (ref 10–35)
Albumin: 4.3 g/dL (ref 3.6–5.1)
Alkaline phosphatase (APISO): 119 U/L (ref 37–153)
BUN: 17 mg/dL (ref 7–25)
CO2: 32 mmol/L (ref 20–32)
Calcium: 9.7 mg/dL (ref 8.6–10.4)
Chloride: 103 mmol/L (ref 98–110)
Creat: 0.76 mg/dL (ref 0.60–0.88)
GFR, Est African American: 81 mL/min/{1.73_m2} (ref >=60)
GFR, Est Non African American: 70 mL/min/{1.73_m2} (ref >=60)
Globulin: 2.7 g/dL (calc) (ref 1.9–3.7)
Glucose, Bld: 85 mg/dL (ref 65–99)
Potassium: 4.4 mmol/L (ref 3.5–5.3)
Sodium: 143 mmol/L (ref 135–146)
Total Bilirubin: 0.6 mg/dL (ref 0.2–1.2)
Total Protein: 7 g/dL (ref 6.1–8.1)

## 2019-06-19 LAB — CBC WITH DIFFERENTIAL/PLATELET
Absolute Monocytes: 706 cells/uL (ref 200–950)
Basophils Absolute: 58 cells/uL (ref 0–200)
Basophils Relative: 0.8 %
Eosinophils Absolute: 72 cells/uL (ref 15–500)
Eosinophils Relative: 1 %
HCT: 42.2 % (ref 35.0–45.0)
Hemoglobin: 14.3 g/dL (ref 11.7–15.5)
Lymphs Abs: 2275 cells/uL (ref 850–3900)
MCH: 31.8 pg (ref 27.0–33.0)
MCHC: 33.9 g/dL (ref 32.0–36.0)
MCV: 94 fL (ref 80.0–100.0)
MPV: 10.9 fL (ref 7.5–12.5)
Monocytes Relative: 9.8 %
Neutro Abs: 4090 cells/uL (ref 1500–7800)
Neutrophils Relative %: 56.8 %
Platelets: 231 10*3/uL (ref 140–400)
RBC: 4.49 10*6/uL (ref 3.80–5.10)
RDW: 11.7 % (ref 11.0–15.0)
Total Lymphocyte: 31.6 %
WBC: 7.2 10*3/uL (ref 3.8–10.8)

## 2019-06-19 LAB — LIPID PANEL
Cholesterol: 169 mg/dL (ref <200)
HDL: 44 mg/dL — ABNORMAL LOW (ref >=50)
LDL Cholesterol (Calc): 97 mg/dL (calc)
Non-HDL Cholesterol (Calc): 125 mg/dL (calc) (ref <130)
Total CHOL/HDL Ratio: 3.8 (calc) (ref <5.0)
Triglycerides: 188 mg/dL — ABNORMAL HIGH (ref <150)

## 2019-06-19 LAB — TSH: TSH: 1.31 mIU/L (ref 0.40–4.50)

## 2019-06-19 LAB — MAGNESIUM: Magnesium: 2.1 mg/dL (ref 1.5–2.5)

## 2019-06-22 ENCOUNTER — Emergency Department (HOSPITAL_COMMUNITY): Payer: Medicare Other

## 2019-06-22 ENCOUNTER — Ambulatory Visit: Payer: Medicare Other | Admitting: Adult Health

## 2019-06-22 ENCOUNTER — Telehealth: Payer: Self-pay

## 2019-06-22 ENCOUNTER — Encounter (HOSPITAL_COMMUNITY): Payer: Self-pay

## 2019-06-22 ENCOUNTER — Other Ambulatory Visit: Payer: Self-pay

## 2019-06-22 ENCOUNTER — Emergency Department (HOSPITAL_COMMUNITY)
Admission: EM | Admit: 2019-06-22 | Discharge: 2019-06-22 | Disposition: A | Discharge status: Home / Self Care | Payer: Medicare Other | Attending: Emergency Medicine | Admitting: Emergency Medicine

## 2019-06-22 ENCOUNTER — Encounter: Payer: Self-pay | Admitting: Adult Health

## 2019-06-22 ENCOUNTER — Other Ambulatory Visit: Payer: Self-pay | Admitting: Adult Health

## 2019-06-22 VITALS — BP 161/87 | HR 100 | Temp 102.0°F | Wt 140.0 lb

## 2019-06-22 DIAGNOSIS — Z20822 Contact with and (suspected) exposure to covid-19: Secondary | ICD-10-CM

## 2019-06-22 DIAGNOSIS — Z79899 Other long term (current) drug therapy: Secondary | ICD-10-CM | POA: Diagnosis not present

## 2019-06-22 DIAGNOSIS — I4891 Unspecified atrial fibrillation: Secondary | ICD-10-CM | POA: Diagnosis not present

## 2019-06-22 DIAGNOSIS — M6281 Muscle weakness (generalized): Secondary | ICD-10-CM | POA: Insufficient documentation

## 2019-06-22 DIAGNOSIS — R0602 Shortness of breath: Secondary | ICD-10-CM | POA: Diagnosis present

## 2019-06-22 DIAGNOSIS — R509 Fever, unspecified: Secondary | ICD-10-CM

## 2019-06-22 DIAGNOSIS — R531 Weakness: Secondary | ICD-10-CM | POA: Diagnosis not present

## 2019-06-22 DIAGNOSIS — R058 Other specified cough: Secondary | ICD-10-CM

## 2019-06-22 DIAGNOSIS — R05 Cough: Secondary | ICD-10-CM | POA: Diagnosis not present

## 2019-06-22 DIAGNOSIS — I2510 Atherosclerotic heart disease of native coronary artery without angina pectoris: Secondary | ICD-10-CM | POA: Diagnosis not present

## 2019-06-22 DIAGNOSIS — Z7901 Long term (current) use of anticoagulants: Secondary | ICD-10-CM | POA: Diagnosis not present

## 2019-06-22 DIAGNOSIS — I10 Essential (primary) hypertension: Secondary | ICD-10-CM | POA: Diagnosis not present

## 2019-06-22 DIAGNOSIS — Z950 Presence of cardiac pacemaker: Secondary | ICD-10-CM | POA: Insufficient documentation

## 2019-06-22 LAB — COMPREHENSIVE METABOLIC PANEL
ALT: 25 U/L (ref 0–44)
AST: 47 U/L — ABNORMAL HIGH (ref 15–41)
Albumin: 4.2 g/dL (ref 3.5–5.0)
Alkaline Phosphatase: 116 U/L (ref 38–126)
Anion gap: 9 (ref 5–15)
BUN: 19 mg/dL (ref 8–23)
CO2: 28 mmol/L (ref 22–32)
Calcium: 9 mg/dL (ref 8.9–10.3)
Chloride: 99 mmol/L (ref 98–111)
Creatinine, Ser: 0.77 mg/dL (ref 0.44–1.00)
GFR calc Af Amer: >60 mL/min (ref >60)
GFR calc non Af Amer: >60 mL/min (ref >60)
Glucose, Bld: 109 mg/dL — ABNORMAL HIGH (ref 70–99)
Potassium: 5.1 mmol/L (ref 3.5–5.1)
Sodium: 136 mmol/L (ref 135–145)
Total Bilirubin: 1.6 mg/dL — ABNORMAL HIGH (ref 0.3–1.2)
Total Protein: 8.1 g/dL (ref 6.5–8.1)

## 2019-06-22 LAB — PROCALCITONIN: Procalcitonin: <0.1 ng/mL

## 2019-06-22 LAB — CBC WITH DIFFERENTIAL/PLATELET
Abs Immature Granulocytes: 0.05 10*3/uL (ref 0.00–0.07)
Basophils Absolute: 0.1 10*3/uL (ref 0.0–0.1)
Basophils Relative: 1 %
Eosinophils Absolute: 0 10*3/uL (ref 0.0–0.5)
Eosinophils Relative: 0 %
HCT: 45.5 % (ref 36.0–46.0)
Hemoglobin: 14.8 g/dL (ref 12.0–15.0)
Immature Granulocytes: 0 %
Lymphocytes Relative: 10 %
Lymphs Abs: 1.4 10*3/uL (ref 0.7–4.0)
MCH: 31.6 pg (ref 26.0–34.0)
MCHC: 32.5 g/dL (ref 30.0–36.0)
MCV: 97 fL (ref 80.0–100.0)
Monocytes Absolute: 1.5 10*3/uL — ABNORMAL HIGH (ref 0.1–1.0)
Monocytes Relative: 11 %
Neutro Abs: 10.2 10*3/uL — ABNORMAL HIGH (ref 1.7–7.7)
Neutrophils Relative %: 78 %
Platelets: 220 10*3/uL (ref 150–400)
RBC: 4.69 MIL/uL (ref 3.87–5.11)
RDW: 12.3 % (ref 11.5–15.5)
WBC: 13.2 10*3/uL — ABNORMAL HIGH (ref 4.0–10.5)
nRBC: 0 % (ref 0.0–0.2)

## 2019-06-22 LAB — URINALYSIS, ROUTINE W REFLEX MICROSCOPIC
Bilirubin Urine: NEGATIVE
Glucose, UA: NEGATIVE mg/dL
Hgb urine dipstick: NEGATIVE
Ketones, ur: 5 mg/dL — AB
Leukocytes,Ua: NEGATIVE
Nitrite: NEGATIVE
Protein, ur: NEGATIVE mg/dL
Specific Gravity, Urine: 1.018 (ref 1.005–1.030)
pH: 6 (ref 5.0–8.0)

## 2019-06-22 LAB — PROTIME-INR
INR: 1.9 — ABNORMAL HIGH (ref 0.8–1.2)
Prothrombin Time: 21.8 seconds — ABNORMAL HIGH (ref 11.4–15.2)

## 2019-06-22 LAB — POC SARS CORONAVIRUS 2 AG -  ED: SARS Coronavirus 2 Ag: NEGATIVE

## 2019-06-22 LAB — FERRITIN: Ferritin: 296 ng/mL (ref 11–307)

## 2019-06-22 LAB — D-DIMER, QUANTITATIVE: D-Dimer, Quant: 0.36 ug/mL-FEU (ref 0.00–0.50)

## 2019-06-22 LAB — RESPIRATORY PANEL BY RT PCR (FLU A&B, COVID)
Influenza A by PCR: NEGATIVE
Influenza B by PCR: NEGATIVE
SARS Coronavirus 2 by RT PCR: NEGATIVE

## 2019-06-22 LAB — BRAIN NATRIURETIC PEPTIDE: B Natriuretic Peptide: 239.2 pg/mL — ABNORMAL HIGH (ref 0.0–100.0)

## 2019-06-22 LAB — TROPONIN I (HIGH SENSITIVITY): Troponin I (High Sensitivity): 10 ng/L (ref <18)

## 2019-06-22 LAB — C-REACTIVE PROTEIN: CRP: 10.2 mg/dL — ABNORMAL HIGH (ref <1.0)

## 2019-06-22 LAB — FIBRINOGEN: Fibrinogen: 710 mg/dL — ABNORMAL HIGH (ref 210–475)

## 2019-06-22 LAB — LACTIC ACID, PLASMA: Lactic Acid, Venous: 1.5 mmol/L (ref 0.5–1.9)

## 2019-06-22 LAB — TRIGLYCERIDES: Triglycerides: 62 mg/dL (ref <150)

## 2019-06-22 MED ORDER — PROMETHAZINE-DM 6.25-15 MG/5ML PO SYRP: 5.0000 mL | ORAL_SOLUTION | Freq: Four times a day (QID) | ORAL | 1 refills | Status: DC | PRN

## 2019-06-22 NOTE — Consult Note (Signed)
Consult Note   Misty Blackwell [id number] DOB: 11/06/1927 DOA: 06/22/2019  PCP: [name], MD  Patient coming from: home   Chief Complaint: cough and fever  HPI: Misty Blackwell is a 86 y.o. female with medical history significant for htn, CAD, sick sinus syndrome, has a pacemaker, chronic a fib, dCHF, and anxiety disorder, who presents with above.  Symptoms began on 2/16, a few days ago. Felt somewhat weak, also developed fever to 101 or 102, also with new cough that is productive of small amount of white sputum. Denies leg swelling. Denies covid exposures, says stays at home and isolates. Denies chest pain. Denies abd pain or nausea/vomiting. Denies dysuria or urinary frequency or hematuria. Denies neck pain/stiffness or headache. Did see her dermatologist just prior to start of symptoms to have a "skin cancer" removed from her neck. Says no significant pain or redness at site of that incision. No recent bug bites or travel.  ED Course: labs. O2 to 88% w/ ambulation w/ nurse tech, resting 95% on room air during my exam.  Review of Systems: As per HPI otherwise 10 point review of systems negative.    Past Medical History:  Diagnosis Date  . Atrial fib/flutter, transient   . Atrial fibrillation, chronic ([place])   . CHF (congestive heart failure) ([place]) 10/29/2009   Echo - EF >55%; normal LV size and systolic function; unable to assess diastolic fcn due to E/A fusion, pulmonary vein flow pattern suggests elevated filling pressure; marked biatrail dilation, mild/mod tricuspid regurgitation; mod pulmonary htn; mild/mod mitral regurgitation; although echocardiographic features are incomplete findings suggest possible infiltrative cardiomyopathy (maybe amyloidosi  . Coronary artery disease 03/19/2002   R/P Cardiolite - EF 76%; nromal static and dynamic myocardial perfusion images; normal wall motion and endocardial thickening in all vascular territories  . Facial numbness 12/26/2008   carotid doppler - R and L ICAs 0-49% diameter reduction (velocities suggest low end of scale)  . Hypertension   . Pacemaker   . Peripheral neuropathy   . Skin cancer    s/p surgical removal.  . TIA (transient ischemic attack)     Past Surgical History:  Procedure Laterality Date  . ABDOMINAL HYSTERECTOMY    . APPENDECTOMY    . CARDIAC CATHETERIZATION  08/06/2005   minimal coronary disease predominant RCA; no significant atherosclerosis; new onset sick sinus syndrome and atrial flutter w/ ventricular response, controlled on med therapy; systemic HTN, normal renal arteries  . CARDIOVERSION  11/19/2009   successful DCCV from AF to sinus type rhythm  . CHOLECYSTECTOMY    . Skin cancer resection       reports that she has never smoked. She has never used smokeless tobacco. She reports that she does not drink alcohol or use drugs.  Allergies  Allergen Reactions  . Latex Itching  . Ace Inhibitors Other (See Comments)    Unknown reaction  . Augmentin [Amoxicillin-Pot Clavulanate] Other (See Comments)  . Ciprofloxacin Other (See Comments)  . Levaquin [Levofloxacin In D5w] Other (See Comments)  . Zocor [Simvastatin] Other (See Comments)  . Acrylic Polymer [Carbomer] Itching  . Chocolate Other (See Comments)    migraine's   . Gabapentin Other (See Comments)    Unsteady gait     Family History  Problem Relation Age of Onset  . Heart disease Mother   . Diabetes Mother   . Heart attack Father   . Heart disease Father   . Cirrhosis Brother     Prior to Admission medications  Medication Sig Start Date End Date Taking? Authorizing Provider  ALPRAZolam (XANAX) 1 MG tablet Take 1/2 - 1 tablet 1 - 2 x /day ONLY if needed for Anxiety Attack &  limit to 5 days /week to avoid Addiction & Dementia Patient taking differently: Take 0.25 mg by mouth at bedtime as needed for anxiety.  06/11/19  Yes [name], MD  Ascorbic Acid (VITAMIN C) 1000 MG tablet Take 2,000 mg by mouth daily.   Yes  [provider]  B Complex-C (SUPER B COMPLEX PO) Take 1 tablet by mouth daily.   Yes [provider]  Cholecalciferol (VITAMIN D3) 3000 units TABS Take 3,000 tablets by mouth daily.    Yes [provider]  Cyanocobalamin (VITAMIN B 12 PO) Take 1,000 mcg by mouth daily.    Yes [provider]  diltiazem (CARDIZEM CD) 180 MG 24 hr capsule TAKE 1 CAPSULE BY MOUTH EVERY DAY 04/10/19  Yes [name], Mihai, MD  escitalopram (LEXAPRO) 10 MG tablet Take 1/2 to 1 tablet Daily for chronic anxiety Patient taking differently: Take 5 mg by mouth at bedtime.  08/30/18  Yes [name], MD  furosemide (LASIX) 40 MG tablet Take 1 tablet (40 mg total) by mouth daily. Patient taking differently: Take 20 mg by mouth daily as needed for fluid.  07/20/17  Yes [name], Mihai, MD  IRON PO Take 65 mg by mouth daily.    Yes [provider]  MAGNESIUM PO Take 1 tablet by mouth daily.   Yes [provider]  Multiple Vitamin (MULTIVITAMIN) tablet Take 1 tablet by mouth daily.   Yes [provider]  nitroGLYCERIN (NITROSTAT) 0.4 MG SL tablet Place 0.4 mg under the tongue every 5 (five) minutes as needed for chest pain.   Yes [provider]  potassium chloride SA (KLOR-CON) 20 MEQ tablet Take 1 tablet daily for potassium replacement. 04/10/19  Yes [name], MD  telmisartan (MICARDIS) 40 MG tablet Take 1 tablet Daily for BP & Heart 03/26/19  Yes [name], MD  traZODone (DESYREL) 150 MG tablet Take 1 tablet 1 hour before Bedtime 06/05/18  Yes [name], MD  warfarin (COUMADIN) 3 MG tablet TAKE 1 TO 1 AND 1/2 TABLET BY MOUTH AS DIRECTED Patient taking differently: Take 3-4.5 mg by mouth as directed. Take 1 tablet (3 mg) Every Tues & Thurs and Take 1.5 tablets (4.5 mg) all other days 04/11/19  Yes [name], [name], MD    Physical Exam: Vitals:   06/22/19 1800 06/22/19 1830 06/22/19 1900 06/22/19 1933  BP: (!) 164/76 (!) 177/84 (!)  170/71 (!) 157/120  Pulse: 89 95 97 (!) 101  Resp: (!) 31 19 (!) 22 (!) 27  Temp:      TempSrc:      SpO2: 100% 98% 92% 93%  Weight:      Height:        Constitutional: No acute distress Head: Atraumatic Eyes: Conjunctiva clear ENM: Moist mucous membranes. Normal dentition.  Neck: Supple Respiratory: Clear to auscultation bilaterally, no wheezing/rales/rhonchi. Slightly decreased respiratory effort. No accessory muscle use. . Cardiovascular: borderline tachycardia. Soft systolic murmur Abdomen: Non-tender, non-distended. No masses. No rebound or guarding. Positive bowel sounds. Musculoskeletal: No joint deformity upper and lower extremities. Normal ROM, no contractures. Normal muscle tone.  Skin: healing sutured skin incision right neck, no surrounding erythema/swelling Extremities: No peripheral edema. Palpable peripheral pulses. Neurologic: Alert, moving all 4 extremities. Psychiatric: Normal insight and judgement.   Labs on Admission: I have personally reviewed  following labs and imaging studies  CBC: Recent Labs  Lab 06/18/19 1120 06/22/19 1410  WBC 7.2 13.2*  NEUTROABS 4,090 10.2*  HGB 14.3 14.8  HCT 42.2 45.5  MCV 94.0 97.0  PLT 231 [id number]   Basic Metabolic Panel: Recent Labs  Lab 06/18/19 1120 06/22/19 1410  NA 143 136  K 4.4 5.1  CL 103 99  CO2 32 28  GLUCOSE 85 109*  BUN 17 19  CREATININE 0.76 0.77  CALCIUM 9.7 9.0  MG 2.1  --    GFR: Estimated Creatinine Clearance: 41.2 mL/min (by C-G formula based on SCr of 0.77 mg/dL). Liver Function Tests: Recent Labs  Lab 06/18/19 1120 06/22/19 1410  AST 18 47*  ALT 15 25  ALKPHOS  --  116  BILITOT 0.6 1.6*  PROT 7.0 8.1  ALBUMIN  --  4.2   No results for input(s): LIPASE, AMYLASE in the last 168 hours. No results for input(s): AMMONIA in the last 168 hours. Coagulation Profile: Recent Labs  Lab 06/22/19 1410  INR 1.9*   Cardiac Enzymes: No results for input(s): CKTOTAL, CKMB, CKMBINDEX,  TROPONINI in the last 168 hours. BNP (last 3 results) No results for input(s): PROBNP in the last 8760 hours. HbA1C: No results for input(s): HGBA1C in the last 72 hours. CBG: No results for input(s): GLUCAP in the last 168 hours. Lipid Profile: Recent Labs    06/22/19 1424  TRIG 62   Thyroid Function Tests: No results for input(s): TSH, T4TOTAL, FREET4, T3FREE, THYROIDAB in the last 72 hours. Anemia Panel: Recent Labs    06/22/19 1523  FERRITIN 296   Urine analysis:    Component Value Date/Time   COLORURINE YELLOW 06/22/2019 [place] 06/22/2019 1634   LABSPEC 1.018 06/22/2019 1634   PHURINE 6.0 06/22/2019 1634   GLUCOSEU NEGATIVE 06/22/2019 1634   HGBUR NEGATIVE 06/22/2019 1634   BILIRUBINUR NEGATIVE 06/22/2019 1634   KETONESUR 5 (A) 06/22/2019 1634   PROTEINUR NEGATIVE 06/22/2019 1634   UROBILINOGEN 1.0 08/24/2013 1509   NITRITE NEGATIVE 06/22/2019 [place] 06/22/2019 1634    Radiological Exams on Admission: DG Chest 2 View  Result Date: 06/22/2019 CLINICAL DATA:  Onset shortness of breath and cough 3 days ago. EXAM: CHEST - 2 VIEW COMPARISON:  PA and lateral chest 05/29/2018. FINDINGS: There is cardiomegaly. Pacing device is in place. Atherosclerosis noted. Lungs clear. No pneumothorax or pleural fluid. No acute or focal bony abnormality. IMPRESSION: No acute disease. Cardiomegaly. Atherosclerosis. Electronically Signed   By: Inge Rise M.D.   On: 06/22/2019 14:36    EKG: Independently reviewed. A fib. TWIs anterolateral leads, present in prior EKG  Assessment/Plan   # Upper respiratory infection - Here febrile, with cough. Rapid and PCR covids negative, no known exposures, is mainly isolated at home. CXR without acute process. No signs DVT on exam and d dimer is wnl. Labs show mild leukocytosis; also has hx of chronic leukocytosis. procalcitonin is low. crp and fibrinogen are elevated. bnp mildly elevated, but w/o LE edema  or pulmonary edema. UA not suggestive of infection. Neck incision healing well w/o signs of infection. O2 normal at rest, to high 80s with ambulation. Initially called to admit for inability to ambulate, but pt said that was transient and felt good enough to walk, would prefer not being admitted. Ambulated again by ED staff, result was mild SOB, per ED patient comfortable with discharge and shared decision to do so. Negative PCR makes covid unlikely  but if positive does not meet clear inpatient criteria. Advise covid precautions and close pcp f/u, low threshold for repeat testing.      Desma Maxim MD [facility] Pager [phone]  If 7PM-7AM, please contact night-coverage [email or web address] Password [facility]  06/22/2019, 8:08 PM

## 2019-06-22 NOTE — Discharge Instructions (Signed)
As we discussed today I suspect that you may have an early coronavirus infection.  We discussed your lab results along with your vital signs and the possibility for admission.  We discussed risks of admission versus risks of going home and you elected to go home at this time. Please get a pulse oximeter which is a device that can clip onto your finger and tell you what your oxygen is.  If your oxygen is dropping below 88% you need to return to the emergency room. If your symptoms worsen, you become short of breath, or you have other concerns or new symptoms please seek additional medical care and evaluation. As we discussed most of your numbers are borderline and you do have a possibility that you may get worse. Please do not hesitate to return to the emergency room. Please quarantine yourself appropriately at home for the next 10 days only leaving home to seek medical care if needed.

## 2019-06-22 NOTE — ED Notes (Addendum)
Pt walked to the bathroom and back to room.  Her O2 went down to 88% and when she sat down it quickly picked up to 91%.  She said she was out of breath after walking and a little weak.

## 2019-06-22 NOTE — Telephone Encounter (Signed)
Patient states that she has started with a cough since Tuesday. She had some skin cancer on her neck removed and after that, symptoms began. No fever. Coughing up "white" phlegm. BP is 161/87 with a Pulse of 100. Requesting cough syrup. Please advise.

## 2019-06-22 NOTE — ED Notes (Signed)
Misty Blackwell, niece, would like an update on her aunt, [phone].

## 2019-06-22 NOTE — Telephone Encounter (Signed)
Recommended in office OV to discuss and evaluate.

## 2019-06-22 NOTE — ED Notes (Signed)
ED Provider [place] notified patient has a negative POC Covid

## 2019-06-22 NOTE — ED Provider Notes (Signed)
[facility] DEPT Provider Note   CSN: [id number] Arrival date & time: 06/22/19  1331     History Chief Complaint  Patient presents with  . Shortness of Breath  . Cough    Misty Blackwell is a 86 y.o. female with a past medical history of A. fib/flutter, CHF, CAD, TIA, hypertension, anticoagulated, who presents today for evaluation of cough and shortness of breath. She reports that 3 days ago she had a skin cancer removed from the right side of her neck however she had been feeling poorly slightly before hand. She reports that since then she has developed shortness of breath and cough. She states her cough is occasionally productive with white sputum.  She reports feeling generally weak and unwell.  She denies any nausea or vomiting. She denies any pain or tightness in her chest or abdomen.  No headache.  She denies any recent falls or trauma.  She was unaware that she had a fever prior to ED arrival.  HPI     Past Medical History:  Diagnosis Date  . Atrial fib/flutter, transient   . Atrial fibrillation, chronic ([place])   . CHF (congestive heart failure) ([place]) 10/29/2009   Echo - EF >55%; normal LV size and systolic function; unable to assess diastolic fcn due to E/A fusion, pulmonary vein flow pattern suggests elevated filling pressure; marked biatrail dilation, mild/mod tricuspid regurgitation; mod pulmonary htn; mild/mod mitral regurgitation; although echocardiographic features are incomplete findings suggest possible infiltrative cardiomyopathy (maybe amyloidosi  . Coronary artery disease 03/19/2002   R/P Cardiolite - EF 76%; nromal static and dynamic myocardial perfusion images; normal wall motion and endocardial thickening in all vascular territories  . Facial numbness 12/26/2008   carotid doppler - R and L ICAs 0-49% diameter reduction (velocities suggest low end of scale)  . Hypertension   . Pacemaker   . Peripheral neuropathy   . Skin cancer     s/p surgical removal.  . TIA (transient ischemic attack)     Patient Active Problem List   Diagnosis Date Noted  . Anxiety 06/18/2019  . Insomnia 06/18/2019  . Recurrent major 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 ([place]) 06/18/2019  . SSS (sick sinus syndrome) ([place]) 09/26/2018  . Abnormal glucose 08/29/2018  . Neural foraminal stenosis of cervical spine 04/18/2018  . History of TIA (transient ischemic attack) 04/17/2018  . Chronic atrial fibrillation ([place])   . Overweight (BMI 25.0-29.9) 11/21/2017  . Chronic diastolic heart failure ([place]) 11/10/2017  . Vitamin D deficiency 08/15/2013  . Medication management 08/15/2013  . Pacemaker 09/19/2012  . Long term current use of anticoagulant therapy 07/18/2012  . Hyperlipidemia, mixed 09/03/2008  . Essential hypertension 09/03/2008  . Coronary atherosclerosis 09/03/2008  . GERD 09/03/2008  . FIBROCYSTIC BREAST DISEASE 09/03/2008  . Osteoarthritis 09/03/2008    Past Surgical History:  Procedure Laterality Date  . ABDOMINAL HYSTERECTOMY    . APPENDECTOMY    . CARDIAC CATHETERIZATION  08/06/2005   minimal coronary disease predominant RCA; no significant atherosclerosis; new onset sick sinus syndrome and atrial flutter w/ ventricular response, controlled on med therapy; systemic HTN, normal renal arteries  . CARDIOVERSION  11/19/2009   successful DCCV from AF to sinus type rhythm  . CHOLECYSTECTOMY    . Skin cancer resection       OB History   No obstetric history on file.     Family History  Problem Relation Age of Onset  . Heart disease Mother   . Diabetes Mother   .  Heart attack Father   . Heart disease Father   . Cirrhosis Brother     Social History   Tobacco Use  . Smoking status: Never Smoker  . Smokeless tobacco: Never Used  Substance Use Topics  . Alcohol use: No  . Drug use: No    Home Medications Prior to Admission medications   Medication Sig Start Date End Date Taking? Authorizing Provider  ALPRAZolam  (XANAX) 1 MG tablet Take 1/2 - 1 tablet 1 - 2 x /day ONLY if needed for Anxiety Attack &  limit to 5 days /week to avoid Addiction & Dementia Patient taking differently: Take 0.25 mg by mouth at bedtime as needed for anxiety.  06/11/19  Yes [name], MD  Ascorbic Acid (VITAMIN C) 1000 MG tablet Take 2,000 mg by mouth daily.   Yes [provider]  B Complex-C (SUPER B COMPLEX PO) Take 1 tablet by mouth daily.   Yes [provider]  Cholecalciferol (VITAMIN D3) 3000 units TABS Take 3,000 tablets by mouth daily.    Yes [provider]  Cyanocobalamin (VITAMIN B 12 PO) Take 1,000 mcg by mouth daily.    Yes [provider]  diltiazem (CARDIZEM CD) 180 MG 24 hr capsule TAKE 1 CAPSULE BY MOUTH EVERY DAY 04/10/19  Yes [name], Mihai, MD  escitalopram (LEXAPRO) 10 MG tablet Take 1/2 to 1 tablet Daily for chronic anxiety Patient taking differently: Take 5 mg by mouth at bedtime.  08/30/18  Yes [name], MD  furosemide (LASIX) 40 MG tablet Take 1 tablet (40 mg total) by mouth daily. Patient taking differently: Take 20 mg by mouth daily as needed for fluid.  07/20/17  Yes [name], Mihai, MD  IRON PO Take 65 mg by mouth daily.    Yes [provider]  MAGNESIUM PO Take 1 tablet by mouth daily.   Yes [provider]  Multiple Vitamin (MULTIVITAMIN) tablet Take 1 tablet by mouth daily.   Yes [provider]  nitroGLYCERIN (NITROSTAT) 0.4 MG SL tablet Place 0.4 mg under the tongue every 5 (five) minutes as needed for chest pain.   Yes [provider]  potassium chloride SA (KLOR-CON) 20 MEQ tablet Take 1 tablet daily for potassium replacement. 04/10/19  Yes [name], MD  telmisartan (MICARDIS) 40 MG tablet Take 1 tablet Daily for BP & Heart 03/26/19  Yes [name], MD  traZODone (DESYREL) 150 MG tablet Take 1 tablet 1 hour before Bedtime 06/05/18  Yes [name], MD  warfarin (COUMADIN) 3 MG tablet TAKE 1 TO 1 AND  1/2 TABLET BY MOUTH AS DIRECTED Patient taking differently: Take 3-4.5 mg by mouth as directed. Take 1 tablet (3 mg) Every Tues & Thurs and Take 1.5 tablets (4.5 mg) all other days 04/11/19  Yes [name], Mihai, MD    Allergies    Latex, Ace inhibitors, Augmentin [amoxicillin-pot clavulanate], Ciprofloxacin, Levaquin [levofloxacin in d5w], Zocor [simvastatin], Acrylic polymer [carbomer], Chocolate, and Gabapentin  Review of Systems   Review of Systems  Constitutional: Positive for chills, fatigue and fever.  Eyes: Negative for visual disturbance.  Respiratory: Positive for cough and shortness of breath. Negative for chest tightness.   Cardiovascular: Negative for chest pain.  Gastrointestinal: Negative for abdominal pain, diarrhea, nausea and vomiting.  Genitourinary: Negative for dysuria.  Musculoskeletal: Negative for back pain and neck pain.  Neurological: Positive for weakness. Negative for headaches.  All other systems reviewed and are negative.   Physical Exam Updated Vital Signs BP (!) 176/79 (  BP Location: Left Arm)   Pulse 93   Temp 100 F (37.8 C) (Oral)   Resp (!) 28   Ht 5\' 4"  (1.626 m)   Wt 63 kg   SpO2 92%   BMI 23.86 kg/m   Physical Exam Vitals and nursing note reviewed.  Constitutional:      General: She is not in acute distress.    Appearance: She is well-developed.  HENT:     Head: Normocephalic and atraumatic.  Eyes:     Conjunctiva/sclera: Conjunctivae normal.  Neck:     Vascular: No JVD.     Comments: Right-sided lateral neck with dressing in place.  Dressing was removed showing a well-healing surgical incision without abnormal discharge, redness or dehiscence. Cardiovascular:     Rate and Rhythm: Normal rate.     Heart sounds: No murmur.     Comments: Irregularly irregular. Pulmonary:     Effort: Pulmonary effort is normal. Tachypnea present. No respiratory distress.     Breath sounds: Normal breath sounds.  Chest:     Chest wall: No  tenderness.  Abdominal:     Palpations: Abdomen is soft.     Tenderness: There is no abdominal tenderness.  Musculoskeletal:     Cervical back: Normal range of motion and neck supple.  Skin:    General: Skin is warm and dry.  Neurological:     Mental Status: She is alert.     Comments: She is awake and alert, responds to questions appropriately. She is able to bear weight on her bilateral lower extremities however she states she is unable to walk due to weakness.    Psychiatric:        Mood and Affect: Mood is anxious.     ED Results / Procedures / Treatments   Labs (all labs ordered are listed, but only abnormal results are displayed) Labs Reviewed  PROTIME-INR - Abnormal; Notable for the following components:      Result Value   Prothrombin Time 21.8 (*)    INR 1.9 (*)    All other components within normal limits  CBC WITH DIFFERENTIAL/PLATELET - Abnormal; Notable for the following components:   WBC 13.2 (*)    Neutro Abs 10.2 (*)    Monocytes Absolute 1.5 (*)    All other components within normal limits  COMPREHENSIVE METABOLIC PANEL - Abnormal; Notable for the following components:   Glucose, Bld 109 (*)    AST 47 (*)    Total Bilirubin 1.6 (*)    All other components within normal limits  BRAIN NATRIURETIC PEPTIDE - Abnormal; Notable for the following components:   B Natriuretic Peptide 239.2 (*)    All other components within normal limits  URINALYSIS, ROUTINE W REFLEX MICROSCOPIC - Abnormal; Notable for the following components:   Ketones, ur 5 (*)    All other components within normal limits  FIBRINOGEN - Abnormal; Notable for the following components:   Fibrinogen 710 (*)    All other components within normal limits  C-REACTIVE PROTEIN - Abnormal; Notable for the following components:   CRP 10.2 (*)    All other components within normal limits  RESPIRATORY PANEL BY RT PCR (FLU A&B, COVID)  URINE CULTURE  CULTURE, BLOOD (ROUTINE X 2)  CULTURE, BLOOD (ROUTINE  X 2)  LACTIC ACID, PLASMA  D-DIMER, QUANTITATIVE (NOT AT [facility])  PROCALCITONIN  FERRITIN  TRIGLYCERIDES  LACTATE DEHYDROGENASE  POC SARS CORONAVIRUS 2 AG -  ED  TROPONIN I (HIGH SENSITIVITY)  EKG EKG Interpretation  Date/Time:  Friday June 22 2019 13:45:10 EST Ventricular Rate:  92 PR Interval:    QRS Duration: 91 QT Interval:  335 QTC Calculation: 415 R Axis:   71 Text Interpretation: Atrial fibrillation Abnormal T, consider ischemia, diffuse leads No significant change since last tracing Confirmed by Virgel Manifold [phone]) on 06/22/2019 2:19:20 PM   Radiology DG Chest 2 View  Result Date: 06/22/2019 CLINICAL DATA:  Onset shortness of breath and cough 3 days ago. EXAM: CHEST - 2 VIEW COMPARISON:  PA and lateral chest 05/29/2018. FINDINGS: There is cardiomegaly. Pacing device is in place. Atherosclerosis noted. Lungs clear. No pneumothorax or pleural fluid. No acute or focal bony abnormality. IMPRESSION: No acute disease. Cardiomegaly. Atherosclerosis. Electronically Signed   By: Inge Rise M.D.   On: 06/22/2019 14:36    Procedures Procedures (including critical care time)  Medications Ordered in ED Medications - No data to display  ED Course  I have reviewed the triage vital signs and the nursing notes.  Pertinent labs & imaging results that were available during my care of the patient were reviewed by me and considered in my medical decision making (see chart for details).  Clinical Course as of Jun 21 2137  Fri Jun 22, 2019  [facility] Hospitalist called. While patient was unable to walk and bear weight earlier she reportedly was able to walk back from the bathroom after urinating.She states that she feels better now.Oxygen has been removed, will attempt to ambulate in room with pulse oximetry.   [EH]  2019 I went to reevaluate patient.  She states that she is feeling better and wishes to go home at this time.  We discussed her oxygen saturation being borderline  however she is not significantly short of breath.Her Covid PCR test is negative at this time.  She does have a slight leukocytosis however there is no clear infectious cause at this time.  Her D-dimer is not significantly elevated and her troponin is not high.While she initially was unable to walk she is now able to ambulate in the room.  And does not feel short of breath.I offered her admission as she had been previously been seen by hospitalist and we discussed risks of admission versus risks of discharge home including possibility of worsening condition that may lead to disability, death, or worsening symptoms and she states her understanding of this and still wishes for discharge home.  She does have blood cultures in process and her lactic acid is not elevated.     [EH]    Clinical Course User Index [EH] Ollen Gross   MDM Rules/Calculators/A&P                     Patient presents today for evaluation of shortness of breath and cough over the past 3 days.  She did have a surgical procedure on her right-sided neck 3 days ago however on exam this appears to be healing well. On arrival here she was febrile at 100.4.  While in the ER she was noted to be tachypneic into the high 20s. While I was in the room I personally observed her oxygen dropping to 91 to 92% with a good waveform on room air. Attempted to ambulate patient at bedside however she was unable to ambulate secondary to feeling generally weak. Her tachypnea worsened and she was placed on 2 L nasal cannula oxygen. Covid antigen test is negative. PCR testing for Covid and influenza a and  B is negative.  Her D-dimer is not significantly elevated.  Troponin is not significantly elevated. White count is slightly elevated at 13.2, however no clear evidence of infection at this time. Her wound from her surgical procedure for skin cancer removal is clean dry and intact without evidence of infection. As patient was too weak to  ambulate hospitalist was consulted.  When patient was seen by hospitalist she admitted that she had since been walking and was able to walk to and from the bathroom without oxygen without difficulties. She was taken off oxygen.  I reevaluated patient, she was able to ambulate at bedside without difficulties that she had had previously despite not getting any specific treatment. She was maintaining an oxygen saturation at about 92 to 93% with good waveform.  She was able to ambulate in the room without feeling short of breath. I did discuss option for admission with patient, especially based on her slight leukocytosis, her intermittent tachycardia, mild tachypnea and her borderline hypoxia. We discussed risks and benefits of admission versus risks of discharge home. She states that she has a family member who she will have pick up a pulse oximeter at the store. Her BNP is slightly elevated above normal however her chest x-ray does not show evidence of pulmonary edema and clinically she is not fluid overloaded.  I suspect this to represent a natural progression rather than a episode of acute heart failure at this time.  Patient stated her understanding of risks of discharge home including worsening condition that may lead to death or disability and states her understanding, still wishes to go home at this time.  This patient was seen as a shared visit with Dr. Wilson Singer.  Patient was also seen by hospitalist who placed a consult note.  Return precautions were discussed with patient who states their understanding.  At the time of discharge patient denied any unaddressed complaints or concerns.  Patient is agreeable for discharge home.  Does have blood cultures pending.  Note: Portions of this report may have been transcribed using voice recognition software. Every effort was made to ensure accuracy; however, inadvertent computerized transcription errors may be present  Misty Blackwell was evaluated in  Emergency Department on 06/22/2019 for the symptoms described in the history of present illness. She was evaluated in the context of the global COVID-19 pandemic, which necessitated consideration that the patient might be at risk for infection with the SARS-CoV-2 virus that causes COVID-19. Institutional protocols and algorithms that pertain to the evaluation of patients at risk for COVID-19 are in a state of rapid change based on information released by regulatory bodies including the CDC and federal and state organizations. These policies and algorithms were followed during the patient's care in the ED.  Final Clinical Impression(s) / ED Diagnoses Final diagnoses:  Weakness generalized  Fever, unspecified fever cause  Suspected COVID-19 virus infection    Rx / DC Orders ED Discharge Orders    None       Ollen Gross 06/22/19 2144    Virgel Manifold, MD 06/26/19 [phone]

## 2019-06-22 NOTE — ED Triage Notes (Signed)
Patient states she had a skin cancer removed from her neck 3 days ago. Patient states that she began having SOB and coughing when they completed the procedure 3 days ago.

## 2019-06-22 NOTE — Progress Notes (Addendum)
Virtual Visit via Telephone Note  I connected with Misty Blackwell on 06/22/19 at 11:15 AM EST by telephone and verified that I am speaking with the correct person using two identifiers.  Location: Patient: home Provider: [place] office    I discussed the limitations, risks, security and privacy concerns of performing an evaluation and management service by telephone and the availability of in person appointments. I also discussed with the patient that there may be a patient responsible charge related to this service. The patient expressed understanding and agreed to proceed.   History of Present Illness:  BP (!) 161/87   Pulse 100   Temp (!) 102 F (38.9 C)   Wt 140 lb (63.5 kg)   BMI 24.03 kg/m   86 y.o. female with hx of pneumonia in 2017, hx of htn, CAD, chronic a. Fib on coumadin, SSS s/p pacemaker, recommended in office visit but patient was still in PJs at 11 AM and without transportation was unable to be evaluated prior to closing time at noon, evaluated via telephone.    She reports on Tuesday has skin cancer lesion removed in derm office, next day she woke up with dry cough which has progressively gotten worse, yesterday started coughing up thick white phlegm, denies feeling feverish but this AM reports she checked a temp and was "102." Denies CP, does endorse some exertional dyspnea but reports has this at baseline, not significantly worse. Does report cough is worse with supine position, has been sleeping propped up. Endorses poor appetite and fatigue. "Just really feeling bad." Denies HA, dizziness, sore throat, nasal congestion, GI sx, rash.   She has had the first covid 19 vaccine on 06/16/2019 at the [facility]   She lives at home by herself  She has a history of Diastolic CHF Denies orthopnea and edema. Positive for dyspnea, fatigue and paroxysmal nocturnal dyspnea. She is on lasix 40 mg daily, admits doesn't routinely check weight at home but had her check today, weight is  down to 140 lb. Unclear what her baseline on home scale is.  Wt Readings from Last 3 Encounters:  06/22/19 140 lb (63.5 kg)  06/18/19 147 lb (66.7 kg)  05/21/19 146 lb (66.2 kg)     Allergies:  Allergies  Allergen Reactions  . Latex Itching  . Ace Inhibitors Other (See Comments)    Unknown reaction  . Augmentin [Amoxicillin-Pot Clavulanate] Other (See Comments)  . Ciprofloxacin Other (See Comments)  . Levaquin [Levofloxacin In D5w] Other (See Comments)  . Zocor [Simvastatin] Other (See Comments)  . Acrylic Polymer [Carbomer] Itching  . Chocolate Other (See Comments)    migraine's   . Gabapentin Other (See Comments)    Unsteady gait    Medical History:  has Hyperlipidemia, mixed; Essential hypertension; Coronary atherosclerosis; GERD; FIBROCYSTIC BREAST DISEASE; Osteoarthritis; Long term current use of anticoagulant therapy; Pacemaker; Vitamin D deficiency; Medication management; Chronic diastolic heart failure ([place]); Overweight (BMI 25.0-29.9); History of TIA (transient ischemic attack); Chronic atrial fibrillation ([place]); Neural foraminal stenosis of cervical spine; Abnormal glucose; SSS (sick sinus syndrome) ([place]); Anxiety; Insomnia; and Recurrent major 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 ([place]) on their problem list. Surgical History:  She  has a past surgical history that includes Cardioversion (11/19/2009); Cardiac catheterization (08/06/2005); Cholecystectomy; Appendectomy; Abdominal hysterectomy; and Skin cancer resection. Family History:  Herfamily history includes Cirrhosis in her brother; Diabetes in her mother; Heart attack in her father; Heart disease in her father and mother. Social History:   reports that she has never  smoked. She has never used smokeless tobacco. She reports that she does not drink alcohol or use drugs.   Observations/Objective:  General : Ill sounding patient in no acute distress HEENT: She has hoarse vocal quality, occasional wet sounding cough throughout  visit Lungs: speaks in complete sentences, no audible wheezing, no apparent acute distress Neurological: alert, oriented x 3 Psychiatric: pleasant, judgement appropriate    Assessment and Plan:  Misty Blackwell was seen today for cough.  Diagnoses and all orders for this visit:  Productive cough Fever, unspecified fever cause Concerning for possible covid 19,  pneumonia, reported fever of 102 at home, increasingly productive cough With her age, risk factors, upcoming weekend discussed ED evaluation vs outpatient workup She doesn't have transportation other than a nephew an hour away who could come pick up up but doesn't think she can manage a CXR, covid testing, pharmacy stop today Would not have anyone at home to help [facility] care of her After discussion she prefers to present to ED for workup, considering age and risk factors I feel this is appropriate for her She states will have family drive her to [facility] ED; pending arrival notified via faxed form to ED -     DG Chest 2 View; Future   Follow Up Instructions:    I discussed the assessment and treatment plan with the patient. The patient was provided an opportunity to ask questions and all were answered. The patient agreed with the plan and demonstrated an understanding of the instructions.   The patient was advised to call back or seek an in-person evaluation if the symptoms worsen or if the condition fails to improve as anticipated.  I provided 15 minutes of non-face-to-face time during this encounter.   Izora Ribas, NP

## 2019-06-23 LAB — URINE CULTURE

## 2019-06-25 ENCOUNTER — Other Ambulatory Visit: Payer: Self-pay

## 2019-06-25 ENCOUNTER — Encounter: Payer: Self-pay | Admitting: Adult Health

## 2019-06-25 ENCOUNTER — Ambulatory Visit (INDEPENDENT_AMBULATORY_CARE_PROVIDER_SITE_OTHER): Payer: Medicare Other | Admitting: Adult Health

## 2019-06-25 VITALS — BP 132/74 | HR 100 | Temp 98.6°F | Wt 142.0 lb

## 2019-06-25 DIAGNOSIS — R058 Other specified cough: Secondary | ICD-10-CM

## 2019-06-25 DIAGNOSIS — R509 Fever, unspecified: Secondary | ICD-10-CM

## 2019-06-25 DIAGNOSIS — J069 Acute upper respiratory infection, unspecified: Secondary | ICD-10-CM | POA: Diagnosis not present

## 2019-06-25 DIAGNOSIS — R0902 Hypoxemia: Secondary | ICD-10-CM

## 2019-06-25 DIAGNOSIS — R05 Cough: Secondary | ICD-10-CM

## 2019-06-25 LAB — COMPLETE METABOLIC PANEL WITH GFR
AG Ratio: 1.2 (calc) (ref 1.0–2.5)
ALT: 17 U/L (ref 6–29)
AST: 22 U/L (ref 10–35)
Albumin: 3.6 g/dL (ref 3.6–5.1)
Alkaline phosphatase (APISO): 138 U/L (ref 37–153)
BUN/Creatinine Ratio: 40 (calc) — ABNORMAL HIGH (ref 6–22)
BUN: 31 mg/dL — ABNORMAL HIGH (ref 7–25)
CO2: 27 mmol/L (ref 20–32)
Calcium: 8.9 mg/dL (ref 8.6–10.4)
Chloride: 98 mmol/L (ref 98–110)
Creat: 0.77 mg/dL (ref 0.60–0.88)
GFR, Est African American: 79 mL/min/{1.73_m2} (ref >=60)
GFR, Est Non African American: 68 mL/min/{1.73_m2} (ref >=60)
Globulin: 3 g/dL (calc) (ref 1.9–3.7)
Glucose, Bld: 111 mg/dL — ABNORMAL HIGH (ref 65–99)
Potassium: 4.1 mmol/L (ref 3.5–5.3)
Sodium: 136 mmol/L (ref 135–146)
Total Bilirubin: 1.5 mg/dL — ABNORMAL HIGH (ref 0.2–1.2)
Total Protein: 6.6 g/dL (ref 6.1–8.1)

## 2019-06-25 LAB — CBC WITH DIFFERENTIAL/PLATELET
Absolute Monocytes: 2165 cells/uL — ABNORMAL HIGH (ref 200–950)
Basophils Absolute: 78 cells/uL (ref 0–200)
Basophils Relative: 0.4 %
Eosinophils Absolute: 20 cells/uL (ref 15–500)
Eosinophils Relative: 0.1 %
HCT: 40.1 % (ref 35.0–45.0)
Hemoglobin: 13.3 g/dL (ref 11.7–15.5)
Lymphs Abs: 1658 cells/uL (ref 850–3900)
MCH: 30.9 pg (ref 27.0–33.0)
MCHC: 33.2 g/dL (ref 32.0–36.0)
MCV: 93.3 fL (ref 80.0–100.0)
MPV: 11.4 fL (ref 7.5–12.5)
Monocytes Relative: 11.1 %
Neutro Abs: 15581 cells/uL — ABNORMAL HIGH (ref 1500–7800)
Neutrophils Relative %: 79.9 %
Platelets: 198 10*3/uL (ref 140–400)
RBC: 4.3 10*6/uL (ref 3.80–5.10)
RDW: 11.4 % (ref 11.0–15.0)
Total Lymphocyte: 8.5 %
WBC: 19.5 10*3/uL — ABNORMAL HIGH (ref 3.8–10.8)

## 2019-06-25 MED ORDER — AZITHROMYCIN 250 MG PO TABS: ORAL_TABLET | ORAL | 1 refills | Status: AC

## 2019-06-25 MED ORDER — PROMETHAZINE-DM 6.25-15 MG/5ML PO SYRP: 5.0000 mL | ORAL_SOLUTION | Freq: Four times a day (QID) | ORAL | 1 refills | Status: DC | PRN

## 2019-06-25 MED ORDER — PREDNISONE 20 MG PO TABS: ORAL_TABLET | ORAL | 0 refills | Status: DC

## 2019-06-25 MED ORDER — BENZONATATE 200 MG PO CAPS: ORAL_CAPSULE | ORAL | 1 refills | Status: DC

## 2019-06-25 NOTE — Patient Instructions (Addendum)
  Suggest she take 1/2 of the recommended coumadin dose while taking zpak and steroid - however please cardiology today prior to starting for their recommendation to be sure  Please pick up a pulse oximeter - monitor at home every few hours or continuously Goal to maintain above 88% - if trending down further and not coming back up, time to go back to ED Return to ED if temp trending up 101+ and doesn't come down with tylenol, etc  Get on vitamin C 500 mg, zinc 50 mg supplements for immune system Continue vitamin D supplement PUSH hydration - alternate pedialyte with water if not eating   Could be bad viral infection or early pneumonia - will treat with antibiotic to cover and be sure  Please have a low threshold for returning to the ED considering your age and risk factors if getting any worse; follow up here is stable but not improving in another 3-7 days.     HOW TO TREAT VIRAL COUGH AND COLD SYMPTOMS:  Medicines you can use  Nasal congestion  Little Remedies saline spray (aerosol/mist)- can try this, it is in the kids section - pseudoephedrine (Sudafed)- behind the counter, do not use if you have high blood pressure, medicine that have -D in them.  - phenylephrine (Sudafed PE) -Dextormethorphan + chlorpheniramine (Coridcidin HBP)- okay if you have high blood pressure -Oxymetazoline (Afrin) nasal spray- LIMIT to 3 days -Saline nasal spray -Neti pot (used distilled or bottled water)  Ear pain/congestion  -pseudoephedrine (sudafed) - Nasonex/flonase nasal spray  Fever  -Acetaminophen (Tyelnol) -Ibuprofen (Advil, motrin, aleve)  Sore Throat  -Acetaminophen (Tyelnol) -Ibuprofen (Advil, motrin, aleve) -Drink a lot of water -Gargle with salt water - Rest your voice (don't talk) -Throat sprays -Cough drops  Body Aches  -Acetaminophen (Tyelnol) -Ibuprofen (Advil, motrin, aleve)  Headache  -Acetaminophen (Tyelnol) -Ibuprofen (Advil, motrin, aleve) - Exedrin, Exedrin  Migraine  Allergy symptoms (cough, sneeze, runny nose, itchy eyes) -Claritin or loratadine cheapest but likely the weakest  -Zyrtec or certizine at night because it can make you sleepy -The strongest is allegra or fexafinadine  Cheapest at [employer], [employer], [employer]  Cough  -Dextromethorphan (Delsym)- medicine that has DM in it -Guafenesin (Mucinex/Robitussin) - cough drops - drink lots of water  Chest Congestion  -Guafenesin (Mucinex/Robitussin)  Red Itchy Eyes  - Naphcon-A  Upset Stomach  - Bland diet (nothing spicy, greasy, fried, and high acid foods like tomatoes, oranges, berries) -OKAY- cereal, bread, soup, crackers, rice -Eat smaller more frequent meals -reduce caffeine, no alcohol -Loperamide (Imodium-AD) if diarrhea -Prevacid for heart burn  General health when sick  -Hydration -wash your hands frequently -keep surfaces clean -change pillow cases and sheets often -Get fresh air but do not exercise strenuously -Vitamin D, double up on it - Vitamin C -Zinc

## 2019-06-25 NOTE — Progress Notes (Signed)
Assessment and Plan:  Cheniqua was seen today for cough.  Diagnoses and all orders for this visit:  URI, acute Productive cough Fever, unspecified fever cause Hypoxia Had negative workup in ED other than mild leukocytosis CXR, lactic acid was negative Follows up for persistent symptoms, was never given cough medication or other recommendations She appears stable other than O2 of 89% is concerning, though this is stable, speaks in complete sentences ? Possible early pneumonia could have been missed on CXR, vs viral etiology  Repeat CMP, CBC for mild abnormalities and trends Considering age and risk factors with proceed to cover for bacterial pneumonia, defer repeat CXR at this time due to burden on patient Appears euvolemic Get pulse oximeter - if trending down further <88%, return to ED Push fluids, alternate with pedialyte if not eating Get on vitamin C, zinc, vitamin D -     CBC with Differential/Platelet -     COMPLETE METABOLIC PANEL WITH GFR -     promethazine-dextromethorphan (PROMETHAZINE-DM) 6.25-15 MG/5ML syrup; Take 5 mLs by mouth 4 (four) times daily as needed for cough. -     benzonatate (TESSALON) 200 MG capsule; Take 1 cap up to 3 times a day as needed for cough. -     azithromycin (ZITHROMAX) 250 MG tablet; Take 2 tablets (500 mg) on  Day 1,  followed by 1 tablet (250 mg) once daily on Days 2 through 5. -     predniSONE (DELTASONE) 20 MG tablet; 2 tablets daily for 3 days, 1 tablet daily for 4 days.  The patient was advised to call immediately if she has any concerning symptoms in the interval. The patient and family member voices understanding of current treatment options and is in agreement with the current care plan.The patient knows to call the clinic with any problems, questions or concerns or go to the ER if any further progression of symptoms.    Further disposition pending results of labs. Discussed med's effects and SE's.   Over 30 minutes of exam, counseling,  chart review, and critical decision making was performed.   Future Appointments  Date Time Provider [place]  07/04/2019  9:30 AM [facility]  07/09/2019  1:45 PM [place] [facility]  07/18/2019  7:25 AM [facility]  09/25/2019 10:30 AM [name], MD [facility] None  10/17/2019  7:25 AM [facility]  12/31/2019 10:00 AM Vicie Mutters, PA-C [facility] None  01/16/2020  7:25 AM [facility]  04/08/2020 11:00 AM [name], MD [facility] None    ------------------------------------------------------------------------------------------------------------------   HPI BP 132/74   Pulse 100   Temp 98.6 F (37 C)   Wt 142 lb (64.4 kg)   SpO2 (!) 89%   BMI 24.37 kg/m   86 y.o.female with hx of a. Fib (on coumadin via cardiology), dCHF, SSS s/p pacemaker presents for follow up evaluation of productive cough. She was evaluated via telephone last week on Friday 2/29/2021, was unable to make it in to office for in person evaluation at that time, reported 4 day history of progressive productive cough, weakness and generally feeling bad; she ultimately decided to present to the ED for comprehensive evaluation.   Comprehensive workup there; was febrile at 100.4, tachypnea; O2 91-92% with ambulation.  Covid rapid and PCR was negative; influenza and respiratory panel was negative. D dimer and troponin were not significantly elevated.  CXR was clear Lactic acid and procalcitonin were  normal White cound slightly elevated at 13.2 without clear evidence of infection Initially hospitalist was consulted for admission due to being too weak to ambulate but this apparently spontaneously improved, patient was later able to ambulate without feeling short of breath She requested discharge home with instructions to monitor pulse  oximeter at home Her BNP was slightly elevated above normal however her chest x-ray did not show evidence of pulmonary edema and clinically she was not felt to be fluid overloaded.  Was suspected to represent a natural progression rather than a episode of acute heart failure at that time.  Blood cultures pending at time of discharge reviewed today and confirmed negative   She is here with [name] her niece who has been watching closely.  She has been in bed, feeling poorly, continues to endorse productive cough.  She has been pushing water, eating crackers and peanut butter.  Has been monitoring temp at home, up to 101, hasn't taken anything.  Hasn't taken anything for cough, reports productive, triggered by speaking. Denies wheezing, CP. Endorses fatigue and exertional dyspnea consistent with baseline. She did not pick up pulse oximeter despite discharge instructions. Per niece they were not aware of these directions.  She speaks in complete sentences without distress other than intermittent episodes of hacking cough. O2 sats are stable at 89% for duration of visit.   She has a history of Diastolic CHF Denies orthopnea, paroxysmal nocturnal dyspnea and edema. Positive for fatigue and exertional dyspnea. Wt Readings from Last 3 Encounters:  06/25/19 142 lb (64.4 kg)  06/22/19 138 lb 15.7 oz (63 kg)  06/22/19 140 lb (63.5 kg)   She follows with cardiology for coumadin management;  Lab Results  Component Value Date   INR 1.9 (H) 06/22/2019   INR 2.0 05/23/2019   INR 2.8 04/11/2019   Lab Results  Component Value Date   WBC 13.2 (H) 06/22/2019   HGB 14.8 06/22/2019   HCT 45.5 06/22/2019   MCV 97.0 06/22/2019   PLT 220 06/22/2019       Past Medical History:  Diagnosis Date  . Atrial fib/flutter, transient   . Atrial fibrillation, chronic ([place])   . CHF (congestive heart failure) ([place]) 10/29/2009   Echo - EF >55%; normal LV size and systolic function; unable to assess diastolic fcn  due to E/A fusion, pulmonary vein flow pattern suggests elevated filling pressure; marked biatrail dilation, mild/mod tricuspid regurgitation; mod pulmonary htn; mild/mod mitral regurgitation; although echocardiographic features are incomplete findings suggest possible infiltrative cardiomyopathy (maybe amyloidosi  . Coronary artery disease 03/19/2002   R/P Cardiolite - EF 76%; nromal static and dynamic myocardial perfusion images; normal wall motion and endocardial thickening in all vascular territories  . Facial numbness 12/26/2008   carotid doppler - R and L ICAs 0-49% diameter reduction (velocities suggest low end of scale)  . Hypertension   . Pacemaker   . Peripheral neuropathy   . Skin cancer    s/p surgical removal.  . TIA (transient ischemic attack)      Allergies  Allergen Reactions  . Latex Itching  . Ace Inhibitors Other (See Comments)    Unknown reaction  . Augmentin [Amoxicillin-Pot Clavulanate] Other (See Comments)  . Ciprofloxacin Other (See Comments)  . Levaquin [Levofloxacin In D5w] Other (See Comments)  . Zocor [Simvastatin] Other (See Comments)  . Acrylic Polymer [Carbomer] Itching  . Chocolate Other (See Comments)    migraine's   . Gabapentin Other (See Comments)    Unsteady gait  Current Outpatient Medications on File Prior to Visit  Medication Sig  . ALPRAZolam (XANAX) 1 MG tablet Take 1/2 - 1 tablet 1 - 2 x /day ONLY if needed for Anxiety Attack &  limit to 5 days /week to avoid Addiction & Dementia (Patient taking differently: Take 0.25 mg by mouth at bedtime as needed for anxiety. )  . Ascorbic Acid (VITAMIN C) 1000 MG tablet Take 2,000 mg by mouth daily.  . B Complex-C (SUPER B COMPLEX PO) Take 1 tablet by mouth daily.  . Cholecalciferol (VITAMIN D3) 3000 units TABS Take 3,000 tablets by mouth daily.   . Cyanocobalamin (VITAMIN B 12 PO) Take 1,000 mcg by mouth daily.   Marland Kitchen diltiazem (CARDIZEM CD) 180 MG 24 hr capsule TAKE 1 CAPSULE BY MOUTH EVERY DAY  .  escitalopram (LEXAPRO) 10 MG tablet Take 1/2 to 1 tablet Daily for chronic anxiety (Patient taking differently: Take 5 mg by mouth at bedtime. )  . furosemide (LASIX) 40 MG tablet Take 1 tablet (40 mg total) by mouth daily. (Patient taking differently: Take 20 mg by mouth daily as needed for fluid. )  . IRON PO Take 65 mg by mouth daily.   Marland Kitchen MAGNESIUM PO Take 1 tablet by mouth daily.  . Multiple Vitamin (MULTIVITAMIN) tablet Take 1 tablet by mouth daily.  . nitroGLYCERIN (NITROSTAT) 0.4 MG SL tablet Place 0.4 mg under the tongue every 5 (five) minutes as needed for chest pain.  . potassium chloride SA (KLOR-CON) 20 MEQ tablet Take 1 tablet daily for potassium replacement.  Marland Kitchen telmisartan (MICARDIS) 40 MG tablet Take 1 tablet Daily for BP & Heart  . traZODone (DESYREL) 150 MG tablet Take 1 tablet 1 hour before Bedtime  . warfarin (COUMADIN) 3 MG tablet TAKE 1 TO 1 AND 1/2 TABLET BY MOUTH AS DIRECTED (Patient taking differently: Take 3-4.5 mg by mouth as directed. Take 1 tablet (3 mg) Every Tues & Thurs and Take 1.5 tablets (4.5 mg) all other days)   No current facility-administered medications on file prior to visit.    ROS: all negative except above.   Physical Exam:  BP 132/74   Pulse 100   Temp 98.6 F (37 C)   Wt 142 lb (64.4 kg)   SpO2 (!) 89%   BMI 24.37 kg/m   General Appearance: Well nourished elder, appears to be feeling unwell but in no acute distress. Eyes: PERRLA, EOMs, conjunctiva no swelling or erythema Sinuses: No Frontal/maxillary tenderness ENT/Mouth: Ext aud canals clear, TMs without erythema, bulging. No erythema, swelling, or exudate on post pharynx.  Tonsils not swollen or erythematous. Bil hearing aids Neck: Supple, thyroid normal.  Respiratory: Respiratory effort normal, BS equal bilaterally without rales, rhonchi, wheezing or stridor. Hacking cough with attempts at deep breaths.  Cardio: Irregularly irregular with no MRGs. Brisk peripheral pulses without edema.   Abdomen: Soft, + BS.  Non tender, no guarding, rebound, hernias, masses. Lymphatics: Non tender without lymphadenopathy.  Musculoskeletal: No obvious deformity, slow gait with assistance.  Skin: Warm, dry without rashes, lesions, ecchymosis.  Neuro: Cranial nerves intact. Normal muscle tone, no cerebellar symptoms. Sensation intact.  Psych: Awake and oriented X 3, normal affect, Insight and Judgment appropriate.     Izora Ribas, NP 2:25 PM [facility]

## 2019-06-27 LAB — CULTURE, BLOOD (ROUTINE X 2)
Culture: NO GROWTH
Special Requests: ADEQUATE

## 2019-06-27 NOTE — Progress Notes (Signed)
Assessment and Plan:  Misty Blackwell was seen today for cough.  Diagnoses and all orders for this visit:  Productive cough Bronchitis Hypoxia Had negative workup in ED other than mild leukocytosis CXR, lactic acid was negative Initiated zpak, steroid taper to cover possible early pneumonia that may have been missed Her symptoms are improving O2 improved from 89 to 94% - continue close monitoring Try mucinex for thick secretions Repeat CMP, CBC for trends Appears euvolemic Push fluids, increase oral intake as tolerated Increase activity/exercise gradually as tolerated; can do PT if residual weakness -     CBC with Differential/Platelet -     COMPLETE METABOLIC PANEL WITH GFR -     promethazine-dextromethorphan (PROMETHAZINE-DM) 6.25-15 MG/5ML syrup; Take 5 mLs by mouth 4 (four) times daily as needed for cough. -     benzonatate (TESSALON) 200 MG capsule; Take 1 cap up to 3 times a day as needed for cough.  The patient was advised to call immediately if she has any concerning symptoms in the interval. The patient and family member voices understanding of current treatment options and is in agreement with the current care plan.The patient knows to call the clinic with any problems, questions or concerns or go to the ER if any further progression of symptoms.    Further disposition pending results of labs. Discussed med's effects and SE's.   Over 30 minutes of exam, counseling, chart review, and critical decision making was performed.   Future Appointments  Date Time Provider [place]  07/04/2019  9:30 AM [facility]  07/09/2019  1:45 PM [place] [facility]  07/18/2019  7:25 AM [facility]  09/25/2019 10:30 AM [name], MD [facility] None  10/17/2019  7:25 AM [facility]  12/31/2019 10:00 AM Vicie Mutters, PA-C [facility] None  01/16/2020  7:25 AM  [facility]  04/08/2020 11:00 AM [name], MD [facility] None    ------------------------------------------------------------------------------------------------------------------   HPI BP (!) 142/74   Pulse 81   Temp (!) 97.5 F (36.4 C)   Wt 142 lb (64.4 kg)   SpO2 94%   BMI 24.37 kg/m   86 y.o.female with hx of a. Fib (on coumadin via cardiology), dCHF, SSS s/p pacemaker presents for follow up evaluation of productive cough/bronchitis. She was evaluated via telephone last week on Friday 2/29/2021, was unable to make it in to office for in person evaluation at that time, reported 4 day history of progressive productive cough, weakness and generally feeling bad; she ultimately decided to present to the ED for comprehensive evaluation.   Comprehensive workup there; was febrile at 100.4, tachypnea; O2 91-92% with ambulation.  Covid rapid and PCR was negative; influenza and respiratory panel was negative. D dimer and troponin were not significantly elevated.  CXR was clear Lactic acid and procalcitonin were normal White cound slightly elevated at 13.2 without clear evidence of infection Initially hospitalist was consulted for admission due to being too weak to ambulate but this apparently spontaneously improved, patient was later able to ambulate without feeling short of breath She requested discharge home with instructions to monitor pulse oximeter at home Her BNP was slightly elevated above normal however her chest x-ray did not show evidence of pulmonary edema and clinically she was not felt to be fluid overloaded.  Was suspected to represent a natural progression rather than a episode of acute heart failure at that time.  Blood cultures pending at time  of discharge reviewed today and confirmed negative   06/25/2019 follow up:  She is here with Misty Blackwell her caregiver and Misty Blackwell her niece who has been watching closely.  She has been in bed,  feeling poorly, continues to endorse productive cough.  She has been pushing water, eating crackers and peanut butter.  Has been monitoring temp at home, up to 101, hasn't taken anything.  Hasn't taken anything for cough, reports productive, triggered by speaking. Denies wheezing, CP. Endorses fatigue and exertional dyspnea consistent with baseline. She did not pick up pulse oximeter despite discharge instructions. Per niece they were not aware of these directions.  She speaks in complete sentences without distress other than intermittent episodes of hacking cough. O2 sats are stable at 89% for duration of visit.  06/28/2019 follow up:  BP (!) 142/74   Pulse 81   Temp (!) 97.5 F (36.4 C)   Wt 142 lb (64.4 kg)   SpO2 94%   BMI 24.37 kg/m   She reports ongoing fatigue, still coughing a fair bit, very productive, large quantities of grey thick mucus, particularly today, has a sense things are breaking up in her chest, improving Has been monitoring oxygen at home with pulse ox, reports has improved from previous to 94% or so. Appetite has been slowly improving, eating eggs, etc soft bland foods.   She has a history of Diastolic CHF Denies orthopnea, paroxysmal nocturnal dyspnea and edema. Positive for fatigue and exertional dyspnea. Wt Readings from Last 3 Encounters:  06/28/19 142 lb (64.4 kg)  06/25/19 142 lb (64.4 kg)  06/22/19 138 lb 15.7 oz (63 kg)   She follows with cardiology for coumadin management;  Lab Results  Component Value Date   INR 1.9 (H) 06/22/2019   INR 2.0 05/23/2019   INR 2.8 04/11/2019   Lab Results  Component Value Date   WBC 19.5 (H) 06/25/2019   HGB 13.3 06/25/2019   HCT 40.1 06/25/2019   MCV 93.3 06/25/2019   PLT 198 06/25/2019     Past Medical History:  Diagnosis Date  . Atrial fib/flutter, transient   . Atrial fibrillation, chronic ([place])   . CHF (congestive heart failure) ([place]) 10/29/2009   Echo - EF >55%; normal LV size and systolic function;  unable to assess diastolic fcn due to E/A fusion, pulmonary vein flow pattern suggests elevated filling pressure; marked biatrail dilation, mild/mod tricuspid regurgitation; mod pulmonary htn; mild/mod mitral regurgitation; although echocardiographic features are incomplete findings suggest possible infiltrative cardiomyopathy (maybe amyloidosi  . Coronary artery disease 03/19/2002   R/P Cardiolite - EF 76%; nromal static and dynamic myocardial perfusion images; normal wall motion and endocardial thickening in all vascular territories  . Facial numbness 12/26/2008   carotid doppler - R and L ICAs 0-49% diameter reduction (velocities suggest low end of scale)  . Hypertension   . Pacemaker   . Peripheral neuropathy   . Skin cancer    s/p surgical removal.  . TIA (transient ischemic attack)      Allergies  Allergen Reactions  . Latex Itching  . Ace Inhibitors Other (See Comments)    Unknown reaction  . Augmentin [Amoxicillin-Pot Clavulanate] Other (See Comments)  . Ciprofloxacin Other (See Comments)  . Levaquin [Levofloxacin In D5w] Other (See Comments)  . Zocor [Simvastatin] Other (See Comments)  . Acrylic Polymer [Carbomer] Itching  . Chocolate Other (See Comments)    migraine's   . Gabapentin Other (See Comments)    Unsteady gait     Current Outpatient  Medications on File Prior to Visit  Medication Sig  . ALPRAZolam (XANAX) 1 MG tablet Take 1/2 - 1 tablet 1 - 2 x /day ONLY if needed for Anxiety Attack &  limit to 5 days /week to avoid Addiction & Dementia (Patient taking differently: Take 0.25 mg by mouth at bedtime as needed for anxiety. )  . Ascorbic Acid (VITAMIN C) 1000 MG tablet Take 2,000 mg by mouth daily.  Marland Kitchen azithromycin (ZITHROMAX) 250 MG tablet Take 2 tablets (500 mg) on  Day 1,  followed by 1 tablet (250 mg) once daily on Days 2 through 5.  . B Complex-C (SUPER B COMPLEX PO) Take 1 tablet by mouth daily.  . benzonatate (TESSALON) 200 MG capsule Take 1 cap up to 3 times a  day as needed for cough.  . Cholecalciferol (VITAMIN D3) 3000 units TABS Take 3,000 tablets by mouth daily.   . Cyanocobalamin (VITAMIN B 12 PO) Take 1,000 mcg by mouth daily.   Marland Kitchen diltiazem (CARDIZEM CD) 180 MG 24 hr capsule TAKE 1 CAPSULE BY MOUTH EVERY DAY  . escitalopram (LEXAPRO) 10 MG tablet Take 1/2 to 1 tablet Daily for chronic anxiety (Patient taking differently: Take 5 mg by mouth at bedtime. )  . furosemide (LASIX) 40 MG tablet Take 1 tablet (40 mg total) by mouth daily. (Patient taking differently: Take 20 mg by mouth daily as needed for fluid. )  . IRON PO Take 65 mg by mouth daily.   Marland Kitchen MAGNESIUM PO Take 1 tablet by mouth daily.  . Multiple Vitamin (MULTIVITAMIN) tablet Take 1 tablet by mouth daily.  . nitroGLYCERIN (NITROSTAT) 0.4 MG SL tablet Place 0.4 mg under the tongue every 5 (five) minutes as needed for chest pain.  . potassium chloride SA (KLOR-CON) 20 MEQ tablet Take 1 tablet daily for potassium replacement.  . predniSONE (DELTASONE) 20 MG tablet 2 tablets daily for 3 days, 1 tablet daily for 4 days.  . promethazine-dextromethorphan (PROMETHAZINE-DM) 6.25-15 MG/5ML syrup Take 5 mLs by mouth 4 (four) times daily as needed for cough.  . telmisartan (MICARDIS) 40 MG tablet Take 1 tablet Daily for BP & Heart  . traZODone (DESYREL) 150 MG tablet Take 1 tablet 1 hour before Bedtime  . warfarin (COUMADIN) 3 MG tablet TAKE 1 TO 1 AND 1/2 TABLET BY MOUTH AS DIRECTED (Patient taking differently: Take 3-4.5 mg by mouth as directed. Take 1 tablet (3 mg) Every Tues & Thurs and Take 1.5 tablets (4.5 mg) all other days)   No current facility-administered medications on file prior to visit.    ROS: all negative except above.   Physical Exam:  BP (!) 142/74   Pulse 81   Temp (!) 97.5 F (36.4 C)   Wt 142 lb (64.4 kg)   SpO2 94%   BMI 24.37 kg/m   General Appearance: Well nourished elder, appears to be feeling unwell but in no acute distress. Eyes: PERRLA, EOMs, conjunctiva no  swelling or erythema Sinuses: No Frontal/maxillary tenderness ENT/Mouth: Ext aud canals clear, TMs without erythema, bulging. No erythema, swelling, or exudate on post pharynx.  Tonsils not swollen or erythematous. Bil hearing aids Neck: Supple, thyroid normal.  Respiratory: Respiratory effort normal, BS equal bilaterally without rales, rhonchi, wheezing or stridor. Hacking cough with attempts at deep breaths.  Cardio: Irregularly irregular with no MRGs. Brisk peripheral pulses without edema.  Abdomen: Soft, + BS.  Non tender, no guarding, rebound, hernias, masses. Lymphatics: Non tender without lymphadenopathy.  Musculoskeletal: No obvious deformity, slow  gait with assistance.  Skin: Warm, dry without rashes, lesions, ecchymosis.  Neuro: Cranial nerves intact. Normal muscle tone, no cerebellar symptoms. Sensation intact.  Psych: Awake and oriented X 3, normal affect, Insight and Judgment appropriate.     Izora Ribas, NP 2:55 PM [facility]

## 2019-06-28 ENCOUNTER — Ambulatory Visit (INDEPENDENT_AMBULATORY_CARE_PROVIDER_SITE_OTHER): Payer: Medicare Other | Admitting: Adult Health

## 2019-06-28 ENCOUNTER — Encounter: Payer: Self-pay | Admitting: Adult Health

## 2019-06-28 ENCOUNTER — Other Ambulatory Visit: Payer: Self-pay

## 2019-06-28 VITALS — BP 142/74 | HR 81 | Temp 97.5°F | Wt 142.0 lb

## 2019-06-28 DIAGNOSIS — R05 Cough: Secondary | ICD-10-CM | POA: Diagnosis not present

## 2019-06-28 DIAGNOSIS — R531 Weakness: Secondary | ICD-10-CM | POA: Diagnosis not present

## 2019-06-28 DIAGNOSIS — R058 Other specified cough: Secondary | ICD-10-CM

## 2019-06-28 DIAGNOSIS — J40 Bronchitis, not specified as acute or chronic: Secondary | ICD-10-CM

## 2019-06-28 NOTE — Patient Instructions (Addendum)
I am pleased with your progress - continue monitoring symptoms as you have been doing  Try picking up mucinex (take with lots of water) - this will help thin out mucus  Some fatigue and weakness is not unusual following a significant illness in older adults; continue to rest, push fluids, gradually increase food intake, work on gradually increasing activity.   Please follow up with me if not continuing to improve, you start having worsening symptoms, fever/chills, or cough isn't clearing up in another week  Please go to ER if sudden worsening in breathing, confusion or falls          -There are a lot of combination medications (Dayquil, Nyquil, Vicks 44, tyelnol cold and sinus, ETC). Please look at the ingredients on the back so that you are treating the correct symptoms and not doubling up on medications/ingredients.    Medicines you can use  Nasal congestion  Little Remedies saline spray (aerosol/mist)- can try this, it is in the kids section - pseudoephedrine (Sudafed)- behind the counter, do not use if you have high blood pressure, medicine that have -D in them.  - phenylephrine (Sudafed PE) -Dextormethorphan + chlorpheniramine (Coridcidin HBP)- okay if you have high blood pressure -Oxymetazoline (Afrin) nasal spray- LIMIT to 3 days -Saline nasal spray -Neti pot (used distilled or bottled water)  Ear pain/congestion  -pseudoephedrine (sudafed) - Nasonex/flonase nasal spray  Fever  -Acetaminophen (Tyelnol) -Ibuprofen (Advil, motrin, aleve)  Sore Throat  -Acetaminophen (Tyelnol) -Ibuprofen (Advil, motrin, aleve) -Drink a lot of water -Gargle with salt water - Rest your voice (don't talk) -Throat sprays -Cough drops  Body Aches  -Acetaminophen (Tyelnol) -Ibuprofen (Advil, motrin, aleve)  Headache  -Acetaminophen (Tyelnol) -Ibuprofen (Advil, motrin, aleve) - Exedrin, Exedrin Migraine  Allergy symptoms (cough, sneeze, runny nose, itchy eyes) -Claritin or  loratadine cheapest but likely the weakest  -Zyrtec or certizine at night because it can make you sleepy -The strongest is allegra or fexafinadine  Cheapest at [employer], [employer], [employer]  Cough  -Dextromethorphan (Delsym)- medicine that has DM in it -Guafenesin (Mucinex/Robitussin) - cough drops - drink lots of water  Chest Congestion  -Guafenesin (Mucinex/Robitussin)  Red Itchy Eyes  - Naphcon-A  Upset Stomach  - Bland diet (nothing spicy, greasy, fried, and high acid foods like tomatoes, oranges, berries) -OKAY- cereal, bread, soup, crackers, rice -Eat smaller more frequent meals -reduce caffeine, no alcohol -Loperamide (Imodium-AD) if diarrhea -Prevacid for heart burn  General health when sick  -Hydration -wash your hands frequently -keep surfaces clean -change pillow cases and sheets often -Get fresh air but do not exercise strenuously -Vitamin D, double up on it - Vitamin C -Zinc

## 2019-06-29 ENCOUNTER — Ambulatory Visit (INDEPENDENT_AMBULATORY_CARE_PROVIDER_SITE_OTHER): Payer: Medicare Other | Admitting: Pharmacist

## 2019-06-29 DIAGNOSIS — Z7901 Long term (current) use of anticoagulants: Secondary | ICD-10-CM

## 2019-06-29 LAB — CBC WITH DIFFERENTIAL/PLATELET
Absolute Monocytes: 1134 cells/uL — ABNORMAL HIGH (ref 200–950)
Basophils Absolute: 76 cells/uL (ref 0–200)
Basophils Relative: 0.4 %
Eosinophils Absolute: 0 cells/uL — ABNORMAL LOW (ref 15–500)
Eosinophils Relative: 0 %
HCT: 39.9 % (ref 35.0–45.0)
Hemoglobin: 13.1 g/dL (ref 11.7–15.5)
Lymphs Abs: 1588 cells/uL (ref 850–3900)
MCH: 30.6 pg (ref 27.0–33.0)
MCHC: 32.8 g/dL (ref 32.0–36.0)
MCV: 93.2 fL (ref 80.0–100.0)
MPV: 11.2 fL (ref 7.5–12.5)
Monocytes Relative: 6 %
Neutro Abs: 16103 cells/uL — ABNORMAL HIGH (ref 1500–7800)
Neutrophils Relative %: 85.2 %
Platelets: 335 10*3/uL (ref 140–400)
RBC: 4.28 10*6/uL (ref 3.80–5.10)
RDW: 11.7 % (ref 11.0–15.0)
Total Lymphocyte: 8.4 %
WBC: 18.9 10*3/uL — ABNORMAL HIGH (ref 3.8–10.8)

## 2019-06-29 LAB — COMPLETE METABOLIC PANEL WITH GFR
AG Ratio: 1.2 (calc) (ref 1.0–2.5)
ALT: 20 U/L (ref 6–29)
AST: 16 U/L (ref 10–35)
Albumin: 3.6 g/dL (ref 3.6–5.1)
Alkaline phosphatase (APISO): 116 U/L (ref 37–153)
BUN/Creatinine Ratio: 63 (calc) — ABNORMAL HIGH (ref 6–22)
BUN: 47 mg/dL — ABNORMAL HIGH (ref 7–25)
CO2: 32 mmol/L (ref 20–32)
Calcium: 10.5 mg/dL — ABNORMAL HIGH (ref 8.6–10.4)
Chloride: 106 mmol/L (ref 98–110)
Creat: 0.75 mg/dL (ref 0.60–0.88)
GFR, Est African American: 82 mL/min/{1.73_m2} (ref >=60)
GFR, Est Non African American: 71 mL/min/{1.73_m2} (ref >=60)
Globulin: 3 g/dL (calc) (ref 1.9–3.7)
Glucose, Bld: 123 mg/dL — ABNORMAL HIGH (ref 65–99)
Potassium: 4.9 mmol/L (ref 3.5–5.3)
Sodium: 144 mmol/L (ref 135–146)
Total Bilirubin: 0.4 mg/dL (ref 0.2–1.2)
Total Protein: 6.6 g/dL (ref 6.1–8.1)

## 2019-07-04 ENCOUNTER — Other Ambulatory Visit: Payer: Self-pay

## 2019-07-04 ENCOUNTER — Ambulatory Visit (INDEPENDENT_AMBULATORY_CARE_PROVIDER_SITE_OTHER): Payer: Medicare Other | Admitting: Pharmacist

## 2019-07-04 DIAGNOSIS — I482 Chronic atrial fibrillation, unspecified: Secondary | ICD-10-CM | POA: Diagnosis not present

## 2019-07-04 DIAGNOSIS — Z7901 Long term (current) use of anticoagulants: Secondary | ICD-10-CM | POA: Diagnosis not present

## 2019-07-04 LAB — POCT INR: INR: 3.4 — AB (ref 2.0–3.0)

## 2019-07-09 ENCOUNTER — Ambulatory Visit: Payer: Medicare Other | Attending: Internal Medicine

## 2019-07-09 DIAGNOSIS — Z23 Encounter for immunization: Secondary | ICD-10-CM | POA: Insufficient documentation

## 2019-07-09 NOTE — Progress Notes (Signed)
   Covid-19 Vaccination Clinic  Name:  Misty Blackwell    MRN: [id number] DOB: May 21, 1927  07/09/2019  [name] was observed post Covid-19 immunization for 15 minutes without incident. She was provided with Vaccine Information Sheet and instruction to access the V-Safe system.   [name] was instructed to call 911 with any severe reactions post vaccine: Marland Kitchen Difficulty breathing  . Swelling of face and throat  . A fast heartbeat  . A bad rash all over body  . Dizziness and weakness   Immunizations Administered    Name Date Dose VIS Date Route   Pfizer COVID-19 Vaccine 07/09/2019  1:34 PM 0.3 mL 04/13/2019 Intramuscular   Manufacturer: [place]   Lot: [id number]   [place]: [id number]

## 2019-07-18 ENCOUNTER — Ambulatory Visit (INDEPENDENT_AMBULATORY_CARE_PROVIDER_SITE_OTHER): Payer: Medicare Other | Admitting: Pharmacist Clinician (PhC)/ Clinical Pharmacy Specialist

## 2019-07-18 ENCOUNTER — Other Ambulatory Visit: Payer: Self-pay

## 2019-07-18 ENCOUNTER — Ambulatory Visit (INDEPENDENT_AMBULATORY_CARE_PROVIDER_SITE_OTHER): Payer: Medicare Other | Admitting: *Deleted

## 2019-07-18 DIAGNOSIS — Z7901 Long term (current) use of anticoagulants: Secondary | ICD-10-CM

## 2019-07-18 DIAGNOSIS — I495 Sick sinus syndrome: Secondary | ICD-10-CM | POA: Diagnosis not present

## 2019-07-18 DIAGNOSIS — I482 Chronic atrial fibrillation, unspecified: Secondary | ICD-10-CM

## 2019-07-18 LAB — CUP PACEART REMOTE DEVICE CHECK
Battery Impedance: 2454 Ohm
Battery Remaining Longevity: 28 mo
Battery Voltage: 2.75 V
Brady Statistic RV Percent Paced: 69 %
Date Time Interrogation Session: 20210317085021
Implantable Lead Implant Date: 20110428
Implantable Lead Implant Date: 20110428
Implantable Lead Location: 753859
Implantable Lead Location: 753860
Implantable Lead Model: 4092
Implantable Lead Model: 4592
Implantable Pulse Generator Implant Date: 20110428
Lead Channel Impedance Value: 67 Ohm
Lead Channel Impedance Value: 688 Ohm
Lead Channel Pacing Threshold Amplitude: 0.75 V
Lead Channel Pacing Threshold Pulse Width: 0.4 ms
Lead Channel Setting Pacing Amplitude: 2.5 V
Lead Channel Setting Pacing Pulse Width: 0.4 ms
Lead Channel Setting Sensing Sensitivity: 5.6 mV

## 2019-07-18 LAB — POCT INR: INR: 3.5 — AB (ref 2.0–3.0)

## 2019-07-18 NOTE — Patient Instructions (Signed)
Take only 1/2 tablet today Wednesday March 17, then decrease dose to 1.5 tablets daily except 1 tablet each Sunday, Tuesday and Thursday.  Repeat INR in 3 weeks.  Call if you get any new antibiotics [phone] (Tonny Isensee/Raquel)

## 2019-07-19 NOTE — Progress Notes (Signed)
PPM Remote  

## 2019-08-02 ENCOUNTER — Other Ambulatory Visit: Payer: Self-pay | Admitting: *Deleted

## 2019-08-02 DIAGNOSIS — I10 Essential (primary) hypertension: Secondary | ICD-10-CM

## 2019-08-02 MED ORDER — POTASSIUM CHLORIDE CRYS ER 20 MEQ PO TBCR: EXTENDED_RELEASE_TABLET | ORAL | 3 refills | Status: DC

## 2019-08-02 MED ORDER — TELMISARTAN 40 MG PO TABS: ORAL_TABLET | ORAL | 3 refills | Status: DC

## 2019-08-02 MED ORDER — DILTIAZEM HCL ER COATED BEADS 180 MG PO CP24: ORAL_CAPSULE | ORAL | 2 refills | Status: DC

## 2019-08-02 MED ORDER — WARFARIN SODIUM 3 MG PO TABS: ORAL_TABLET | ORAL | 1 refills | Status: DC

## 2019-08-08 ENCOUNTER — Other Ambulatory Visit: Payer: Self-pay

## 2019-08-08 ENCOUNTER — Ambulatory Visit (INDEPENDENT_AMBULATORY_CARE_PROVIDER_SITE_OTHER): Payer: Medicare Other | Admitting: Pharmacist Clinician (PhC)/ Clinical Pharmacy Specialist

## 2019-08-08 DIAGNOSIS — I482 Chronic atrial fibrillation, unspecified: Secondary | ICD-10-CM

## 2019-08-08 DIAGNOSIS — Z7901 Long term (current) use of anticoagulants: Secondary | ICD-10-CM | POA: Diagnosis not present

## 2019-08-08 LAB — POCT INR: INR: 2.4 (ref 2.0–3.0)

## 2019-08-08 NOTE — Patient Instructions (Signed)
Continue with 1.5 tablets daily except 1 tablet each Sunday, Tuesday and Thursday.  Repeat INR in 4 weeks.  Call if you get any new antibiotics [phone] (Dorissa Stinnette/Raquel)

## 2019-09-05 ENCOUNTER — Other Ambulatory Visit: Payer: Self-pay

## 2019-09-05 ENCOUNTER — Ambulatory Visit (INDEPENDENT_AMBULATORY_CARE_PROVIDER_SITE_OTHER): Payer: Medicare Other | Admitting: *Deleted

## 2019-09-05 DIAGNOSIS — I482 Chronic atrial fibrillation, unspecified: Secondary | ICD-10-CM | POA: Diagnosis not present

## 2019-09-05 DIAGNOSIS — Z7901 Long term (current) use of anticoagulants: Secondary | ICD-10-CM

## 2019-09-05 DIAGNOSIS — Z5181 Encounter for therapeutic drug level monitoring: Secondary | ICD-10-CM

## 2019-09-05 LAB — POCT INR: INR: 2.6 (ref 2.0–3.0)

## 2019-09-05 NOTE — Patient Instructions (Signed)
Description   Continue with 1.5 tablets daily except 1 tablet each Sunday, Tuesday and Thursday.  Repeat INR in 5 weeks.  Call if you get any new antibiotics [phone] (Kristin/Raquel)

## 2019-09-24 ENCOUNTER — Encounter: Payer: Self-pay | Admitting: Internal Medicine

## 2019-09-24 NOTE — Patient Instructions (Signed)

## 2019-09-24 NOTE — Progress Notes (Signed)
History of Present Illness:       This very nice 86 y.o.  [name]  presents for 6 month follow up with HTN, ASHD/cAfib,  HLD, Pre-Diabetes and Vitamin D Deficiency.       Patient is treated for HTN (1998)  & BP has been controlled at home. Today's BP was initially slightly elevated & rechecked at goal - 136/84.  I n 1993 & 2007, she had negative Heart Cath showing non obstructive CAD. In 2006, she was dx'd with pAfib after an embolic TIA which recovered completely. She remains on Coumadin followed at [facility] clinics. In 2007, she had a PPM implanted for SSS. Patient has had no complaints of any cardiac type chest pain, palpitations, dyspnea / orthopnea / PND, dizziness, claudication, or dependent edema.      Hyperlipidemia is controlled with diet & meds. Patient denies myalgias or other med SE's. Last Lipids were at goal:  Lab Results  Component Value Date   CHOL 169 06/18/2019   HDL 44 (L) 06/18/2019   LDLCALC 97 06/18/2019   TRIG 62 06/22/2019   CHOLHDL 3.8 06/18/2019    Also, the patient is followed for history of prediabetes with Insulin Resistance(A1c 5.9% / Insulin elev86 / 2011)  and has had no symptoms of reactive hypoglycemia, diabetic polys, paresthesias or visual blurring.  Last A1c was near goal:  Lab Results  Component Value Date   HGBA1C 5.7 (H) 03/12/2019           Further, the patient also has history of Vitamin D Deficiency and supplements vitamin D without any suspected side-effects. Last vitamin D was sl low (goal 70-100):  Lab Results  Component Value Date   VD25OH 53 03/12/2019    Current Outpatient Medications on File Prior to Visit  Medication Sig  . Ascorbic Acid (VITAMIN C) 1000 MG tablet Take 2,000 mg by mouth daily.  . Cyanocobalamin (VITAMIN B 12 PO) Take 1,000 mcg by mouth daily.   Marland Kitchen diltiazem (CARDIZEM CD) 180 MG 24 hr capsule TAKE 1 CAPSULE BY MOUTH EVERY DAY  . furosemide (LASIX) 40 MG tablet Take 1 tablet (40 mg total) by mouth  daily. (Patient taking differently: Take 20 mg by mouth daily as needed for fluid. )  . IRON PO Take 65 mg by mouth daily.   Marland Kitchen MAGNESIUM PO Take 250 mg by mouth daily.   . nitroGLYCERIN (NITROSTAT) 0.4 MG SL tablet Place 0.4 mg under the tongue every 5 (five) minutes as needed for chest pain.  . potassium chloride SA (KLOR-CON) 20 MEQ tablet Take 1 tablet daily for potassium replacement.  Marland Kitchen telmisartan (MICARDIS) 40 MG tablet Take 1 tablet Daily for BP & Heart  . VITAMIN D PO Take 5,000 Units by mouth daily.  Marland Kitchen warfarin (COUMADIN) 3 MG tablet TAKE 1 TO 1 AND 1/2 TABLET BY MOUTH AS DIRECTED   No current facility-administered medications on file prior to visit.    Allergies  Allergen Reactions  . Latex Itching  . Ace Inhibitors Other (See Comments)    Unknown reaction  . Augmentin [Amoxicillin-Pot Clavulanate] Other (See Comments)  . Ciprofloxacin Other (See Comments)  . Levaquin [Levofloxacin In D5w] Other (See Comments)  . Zocor [Simvastatin] Other (See Comments)  . Acrylic Polymer [Carbomer] Itching  . Chocolate Other (See Comments)    migraine's   . Gabapentin Other (See Comments)    Unsteady gait     PMHx:   Past Medical History:  Diagnosis  Date  . Atrial fib/flutter, transient   . Atrial fibrillation, chronic ([place])   . CHF (congestive heart failure) ([place]) 10/29/2009   Echo - EF >55%; normal LV size and systolic function; unable to assess diastolic fcn due to E/A fusion, pulmonary vein flow pattern suggests elevated filling pressure; marked biatrail dilation, mild/mod tricuspid regurgitation; mod pulmonary htn; mild/mod mitral regurgitation; although echocardiographic features are incomplete findings suggest possible infiltrative cardiomyopathy (maybe amyloidosi  . Coronary artery disease 03/19/2002   R/P Cardiolite - EF 76%; nromal static and dynamic myocardial perfusion images; normal wall motion and endocardial thickening in all vascular territories  . Facial numbness  12/26/2008   carotid doppler - R and L ICAs 0-49% diameter reduction (velocities suggest low end of scale)  . Hypertension   . Pacemaker   . Peripheral neuropathy   . Skin cancer    s/p surgical removal.  . TIA (transient ischemic attack)     Immunization History  Administered Date(s) Administered  . DT (Pediatric) 03/19/2015  . Influenza Split 05/04/2011  . Influenza, High Dose Seasonal PF 03/19/2014, 12/23/2015, 02/03/2017, 12/26/2018  . Influenza,inj,quad, With Preservative 05/21/2013  . Influenza-Unspecified 02/04/2015, 02/03/2017  . PFIZER SARS-COV-2 Vaccination 06/16/2019, 07/09/2019  . Pneumococcal Conjugate-13 03/19/2014  . Pneumococcal Polysaccharide-23 05/04/2011  . Pneumococcal-Unspecified 05/03/2001  . Td 05/04/2003    Past Surgical History:  Procedure Laterality Date  . ABDOMINAL HYSTERECTOMY    . APPENDECTOMY    . CARDIAC CATHETERIZATION  08/06/2005   minimal coronary disease predominant RCA; no significant atherosclerosis; new onset sick sinus syndrome and atrial flutter w/ ventricular response, controlled on med therapy; systemic HTN, normal renal arteries  . CARDIOVERSION  11/19/2009   successful DCCV from AF to sinus type rhythm  . CHOLECYSTECTOMY    . Skin cancer resection      FHx:    Reviewed / unchanged  SHx:    Reviewed / unchanged   Systems Review:  Constitutional: Denies fever, chills, wt changes, headaches, insomnia, fatigue, night sweats, change in appetite. Eyes: Denies redness, blurred vision, diplopia, discharge, itchy, watery eyes.  ENT: Denies discharge, congestion, post nasal drip, epistaxis, sore throat, earache, hearing loss, dental pain, tinnitus, vertigo, sinus pain, snoring.  CV: Denies chest pain, palpitations, irregular heartbeat, syncope, dyspnea, diaphoresis, orthopnea, PND, claudication or edema. Respiratory: denies cough, dyspnea, DOE, pleurisy, hoarseness, laryngitis, wheezing.  Gastrointestinal: Denies dysphagia, odynophagia,  heartburn, reflux, water brash, abdominal pain or cramps, nausea, vomiting, bloating, diarrhea, constipation, hematemesis, melena, hematochezia  or hemorrhoids. Genitourinary: Denies dysuria, frequency, urgency, nocturia, hesitancy, discharge, hematuria or flank pain. Musculoskeletal: Denies arthralgias, myalgias, stiffness, jt. swelling, pain, limping or strain/sprain.  Skin: Denies pruritus, rash, hives, warts, acne, eczema or change in skin lesion(s). Neuro: No weakness, tremor, incoordination, spasms, paresthesia or pain. Psychiatric: Denies confusion, memory loss or sensory loss. Endo: Denies change in weight, skin or hair change.  Heme/Lymph: No excessive bleeding, bruising or enlarged lymph nodes.  Physical Exam  BP 136/84   Pulse 76   Temp (!) 97.2 F (36.2 C)   Resp 16   Ht 5\' 4"  (1.626 m)   Wt 143 lb 12.8 oz (65.2 kg)   BMI 24.68 kg/m   Appears  well nourished, well groomed  and in no distress.  Eyes: PERRLA, EOMs, conjunctiva no swelling or erythema. Sinuses: No frontal/maxillary tenderness ENT/Mouth: EAC's clear, TM's nl w/o erythema, bulging. Nares clear w/o erythema, swelling, exudates. Oropharynx clear without erythema or exudates. Oral hygiene is good. Tongue normal, non obstructing. Hearing intact.  Neck: Supple. Thyroid not palpable. Car 2+/2+ without bruits, nodes or JVD. Chest: Respirations nl with BS clear & equal w/o rales, rhonchi, wheezing or stridor.  Cor: Heart sounds normal w/ regular rate and rhythm without sig. murmurs, gallops, clicks or rubs. Peripheral pulses normal and equal  without edema.  Abdomen: Soft & bowel sounds normal. Non-tender w/o guarding, rebound, hernias, masses or organomegaly.  Lymphatics: Unremarkable.  Musculoskeletal: Full ROM all peripheral extremities, joint stability, 5/5 strength and normal gait.  Skin: Warm, dry without exposed rashes, lesions or ecchymosis apparent.  Neuro: Cranial nerves intact, reflexes equal bilaterally.  Sensory-motor testing grossly intact. Tendon reflexes grossly intact.  Pysch: Alert & oriented x 3.  Insight and judgement nl & appropriate. No ideations.  Assessment and Plan:  1. Essential hypertension  - Continue medication, monitor blood pressure at home.  - Continue DASH diet.  Reminder to go to the ER if any CP,  SOB, nausea, dizziness, severe HA, changes vision/speech.  - CBC with Differential/Platelet - COMPLETE METABOLIC PANEL WITH GFR - Magnesium - TSH  2. Hyperlipidemia, mixed  - Continue diet/meds, exercise,& lifestyle modifications.  -  Continue monitor periodic cholesterol/liver & renal functions   - Lipid panel - TSH  3. Abnormal glucose  - Continue diet, exercise  - Lifestyle modifications.  - Monitor appropriate labs.  - Hemoglobin A1c - Insulin, random  4. Vitamin D deficiency  - Continue supplementation.  - VITAMIN D 25 Hydroxy  5. Chronic atrial fibrillation (HCC)  7. Medication management  - CBC with Differential/Platelet - COMPLETE METABOLIC PANEL WITH GFR - Magnesium - Lipid panel - TSH - Hemoglobin A1c - Insulin, random - VITAMIN D 25 Hydroxy       Discussed  regular exercise, BP monitoring, weight control to achieve/maintain BMI less than 25 and discussed med and SE's. Recommended labs to assess and monitor clinical status with further disposition pending results of labs.  I discussed the assessment and treatment plan with the patient. The patient was provided an opportunity to ask questions and all were answered. The patient agreed with the plan and demonstrated an understanding of the instructions.  I provided over 30 minutes of exam, counseling, chart review and  complex critical decision making.         The patient was advised to call back or seek an in-person evaluation if the symptoms worsen or if the condition fails to improve as anticipated.   Kirtland Bouchard, MD

## 2019-09-25 ENCOUNTER — Ambulatory Visit (INDEPENDENT_AMBULATORY_CARE_PROVIDER_SITE_OTHER): Payer: Medicare Other | Admitting: Internal Medicine

## 2019-09-25 ENCOUNTER — Other Ambulatory Visit: Payer: Self-pay

## 2019-09-25 ENCOUNTER — Encounter: Payer: Self-pay | Admitting: Internal Medicine

## 2019-09-25 VITALS — BP 136/84 | HR 76 | Temp 97.2°F | Resp 16 | Ht 64.0 in | Wt 143.8 lb

## 2019-09-25 DIAGNOSIS — R7309 Other abnormal glucose: Secondary | ICD-10-CM | POA: Diagnosis not present

## 2019-09-25 DIAGNOSIS — E559 Vitamin D deficiency, unspecified: Secondary | ICD-10-CM

## 2019-09-25 DIAGNOSIS — E782 Mixed hyperlipidemia: Secondary | ICD-10-CM | POA: Diagnosis not present

## 2019-09-25 DIAGNOSIS — I482 Chronic atrial fibrillation, unspecified: Secondary | ICD-10-CM

## 2019-09-25 DIAGNOSIS — I10 Essential (primary) hypertension: Secondary | ICD-10-CM

## 2019-09-25 DIAGNOSIS — I2510 Atherosclerotic heart disease of native coronary artery without angina pectoris: Secondary | ICD-10-CM

## 2019-09-25 DIAGNOSIS — Z79899 Other long term (current) drug therapy: Secondary | ICD-10-CM

## 2019-10-03 ENCOUNTER — Ambulatory Visit: Payer: Medicare Other | Admitting: Physician Assistant

## 2019-10-03 LAB — INSULIN, RANDOM: Insulin: 9.6 u[IU]/mL

## 2019-10-10 ENCOUNTER — Other Ambulatory Visit: Payer: Self-pay

## 2019-10-10 ENCOUNTER — Ambulatory Visit (INDEPENDENT_AMBULATORY_CARE_PROVIDER_SITE_OTHER): Payer: Medicare Other | Admitting: Pharmacist Clinician (PhC)/ Clinical Pharmacy Specialist

## 2019-10-10 DIAGNOSIS — Z8673 Personal history of transient ischemic attack (TIA), and cerebral infarction without residual deficits: Secondary | ICD-10-CM | POA: Diagnosis not present

## 2019-10-10 DIAGNOSIS — I482 Chronic atrial fibrillation, unspecified: Secondary | ICD-10-CM

## 2019-10-10 DIAGNOSIS — Z7901 Long term (current) use of anticoagulants: Secondary | ICD-10-CM

## 2019-10-10 LAB — COMPLETE METABOLIC PANEL WITH GFR
AG Ratio: 1.9 (calc) (ref 1.0–2.5)
ALT: 12 U/L (ref 6–29)
AST: 18 U/L (ref 10–35)
Albumin: 4.3 g/dL (ref 3.6–5.1)
Alkaline phosphatase (APISO): 118 U/L (ref 37–153)
BUN: 21 mg/dL (ref 7–25)
CO2: 32 mmol/L (ref 20–32)
Calcium: 9.6 mg/dL (ref 8.6–10.4)
Chloride: 103 mmol/L (ref 98–110)
Creat: 0.83 mg/dL (ref 0.60–0.88)
GFR, Est African American: 72 mL/min/{1.73_m2} (ref >=60)
GFR, Est Non African American: 62 mL/min/{1.73_m2} (ref >=60)
Globulin: 2.3 g/dL (calc) (ref 1.9–3.7)
Glucose, Bld: 89 mg/dL (ref 65–99)
Potassium: 4.6 mmol/L (ref 3.5–5.3)
Sodium: 142 mmol/L (ref 135–146)
Total Bilirubin: 0.6 mg/dL (ref 0.2–1.2)
Total Protein: 6.6 g/dL (ref 6.1–8.1)

## 2019-10-10 LAB — LIPID PANEL
Cholesterol: 139 mg/dL (ref <200)
HDL: 44 mg/dL — ABNORMAL LOW (ref >=50)
LDL Cholesterol (Calc): 71 mg/dL (calc)
Non-HDL Cholesterol (Calc): 95 mg/dL (calc) (ref <130)
Total CHOL/HDL Ratio: 3.2 (calc) (ref <5.0)
Triglycerides: 160 mg/dL — ABNORMAL HIGH (ref <150)

## 2019-10-10 LAB — CBC WITH DIFFERENTIAL/PLATELET
Absolute Monocytes: 689 cells/uL (ref 200–950)
Basophils Absolute: 49 cells/uL (ref 0–200)
Basophils Relative: 0.6 %
Eosinophils Absolute: 41 cells/uL (ref 15–500)
Eosinophils Relative: 0.5 %
HCT: 40.4 % (ref 35.0–45.0)
Hemoglobin: 13.2 g/dL (ref 11.7–15.5)
Lymphs Abs: 3070 cells/uL (ref 850–3900)
MCH: 30.4 pg (ref 27.0–33.0)
MCHC: 32.7 g/dL (ref 32.0–36.0)
MCV: 93.1 fL (ref 80.0–100.0)
MPV: 11 fL (ref 7.5–12.5)
Monocytes Relative: 8.5 %
Neutro Abs: 4253 cells/uL (ref 1500–7800)
Neutrophils Relative %: 52.5 %
Platelets: 228 10*3/uL (ref 140–400)
RBC: 4.34 10*6/uL (ref 3.80–5.10)
RDW: 11.8 % (ref 11.0–15.0)
Total Lymphocyte: 37.9 %
WBC: 8.1 10*3/uL (ref 3.8–10.8)

## 2019-10-10 LAB — HEMOGLOBIN A1C
Hgb A1c MFr Bld: 5.3 % of total Hgb (ref <5.7)
Mean Plasma Glucose: 105 (calc)
eAG (mmol/L): 5.8 (calc)

## 2019-10-10 LAB — VITAMIN D 25 HYDROXY (VIT D DEFICIENCY, FRACTURES): Vit D, 25-Hydroxy: 50 ng/mL (ref 30–100)

## 2019-10-10 LAB — MAGNESIUM: Magnesium: 2.2 mg/dL (ref 1.5–2.5)

## 2019-10-10 LAB — INSULIN, RANDOM

## 2019-10-10 LAB — POCT INR: INR: 2 (ref 2.0–3.0)

## 2019-10-10 LAB — TSH: TSH: 1.48 mIU/L (ref 0.40–4.50)

## 2019-10-17 ENCOUNTER — Ambulatory Visit (INDEPENDENT_AMBULATORY_CARE_PROVIDER_SITE_OTHER): Payer: Medicare Other | Admitting: *Deleted

## 2019-10-17 DIAGNOSIS — I495 Sick sinus syndrome: Secondary | ICD-10-CM

## 2019-10-17 LAB — CUP PACEART REMOTE DEVICE CHECK
Battery Impedance: 2679 Ohm
Battery Remaining Longevity: 26 mo
Battery Voltage: 2.75 V
Brady Statistic RV Percent Paced: 70 %
Date Time Interrogation Session: 20210616072133
Implantable Lead Implant Date: 20110428
Implantable Lead Implant Date: 20110428
Implantable Lead Location: 753859
Implantable Lead Location: 753860
Implantable Lead Model: 4092
Implantable Lead Model: 4592
Implantable Pulse Generator Implant Date: 20110428
Lead Channel Impedance Value: 67 Ohm
Lead Channel Impedance Value: 718 Ohm
Lead Channel Pacing Threshold Amplitude: 0.75 V
Lead Channel Pacing Threshold Pulse Width: 0.4 ms
Lead Channel Setting Pacing Amplitude: 2.5 V
Lead Channel Setting Pacing Pulse Width: 0.4 ms
Lead Channel Setting Sensing Sensitivity: 4 mV

## 2019-10-18 NOTE — Progress Notes (Signed)
Remote pacemaker transmission.   

## 2019-10-25 ENCOUNTER — Other Ambulatory Visit: Payer: Self-pay | Admitting: Internal Medicine

## 2019-10-29 NOTE — Progress Notes (Signed)
     History of Present Illness:     This nice 86 yo [name] presents with c/o of a lump in her Rt breast she has noticed for about a week. She reports she usually does daily SBE with showering.  She admits many years overdue for [facility] which she has avoided due to breast tenderness & bruising (on Warfarin)   Medications   Current Outpatient Medications (Cardiovascular):  .  diltiazem (CARDIZEM CD) 180 MG 24 hr capsule, TAKE 1 CAPSULE BY MOUTH EVERY DAY .  furosemide (LASIX) 40 MG tablet, Take 1 tablet (40 mg total) by mouth daily. (Patient taking differently: Take 20 mg by mouth daily as needed for fluid. ) .  nitroGLYCERIN (NITROSTAT) 0.4 MG SL tablet, Place 0.4 mg under the tongue every 5 (five) minutes as needed for chest pain. Marland Kitchen  telmisartan (MICARDIS) 40 MG tablet, Take 1 tablet Daily for BP & Heart   Current Outpatient Medications (Analgesics):  .  traMADol (ULTRAM) 50 MG tablet, Take 1 tablet every 4 hours as needed for Pain  Current Outpatient Medications (Hematological):  Marland Kitchen  Cyanocobalamin (VITAMIN B 12 PO), Take 1,000 mcg by mouth daily.  .  IRON PO, Take 65 mg by mouth daily.  Marland Kitchen  warfarin (COUMADIN) 3 MG tablet, TAKE 1 TO 1 AND 1/2 TABLET BY MOUTH AS DIRECTED  Current Outpatient Medications (Other):  Marland Kitchen  Ascorbic Acid (VITAMIN C) 1000 MG tablet, Take 2,000 mg by mouth daily. Marland Kitchen  MAGNESIUM PO, Take 250 mg by mouth daily.  .  potassium chloride SA (KLOR-CON) 20 MEQ tablet, Take 1 tablet daily for potassium replacement. Marland Kitchen  VITAMIN D PO, Take 5,000 Units by mouth daily.  Problem list She has Hyperlipidemia, mixed; Essential hypertension; Coronary atherosclerosis; GERD; FIBROCYSTIC BREAST DISEASE; Osteoarthritis; Long term current use of anticoagulant therapy; Pacemaker; Vitamin D deficiency; Medication management; Chronic diastolic heart failure ([place]); Overweight (BMI 25.0-29.9); History of TIA (transient ischemic attack); Chronic atrial fibrillation ([place]); Neural foraminal stenosis of  cervical spine; Abnormal glucose; SSS (sick sinus syndrome) ([place]); Anxiety; Insomnia; and Recurrent major 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 ([place]) on their problem list.   Observations/Objective:   BP (!) 166/84   Pulse 84   Temp (!) 97.3 F (36.3 C)   Resp 16   Ht 5\' 4"  (1.626 m)   Wt 145 lb 12.8 oz (66.1 kg)   BMI 25.03 kg/m   Focused exam on breasts. Both are exquisitely tender with exam. On the Lt - No masses.  On the Rt there is a 2 x 3 cm mobile tender mass at 12 o'clock about 1 " above the areola. No overlying skin changes.   Assessment and Plan:  1. Breast mass, right  - MM Digital Diagnostic Bilat; Future      I discussed the assessment and treatment plan with the patient. The patient was provided an opportunity to ask questions and all were answered. The patient agreed with the plan and demonstrated an understanding of the instructions.      Patient was advised to take Tylenol 1,000 mg about 1-2 hours before her MGM & also sent in Rx Tramadol 50 mg #6 to take 1 tab about 1 hour before her scheduled appt.    Kirtland Bouchard, MD

## 2019-10-30 ENCOUNTER — Other Ambulatory Visit: Payer: Self-pay

## 2019-10-30 ENCOUNTER — Ambulatory Visit (INDEPENDENT_AMBULATORY_CARE_PROVIDER_SITE_OTHER): Payer: Medicare Other | Admitting: Internal Medicine

## 2019-10-30 ENCOUNTER — Other Ambulatory Visit: Payer: Self-pay | Admitting: Internal Medicine

## 2019-10-30 ENCOUNTER — Encounter: Payer: Self-pay | Admitting: Internal Medicine

## 2019-10-30 VITALS — BP 166/84 | HR 84 | Temp 97.3°F | Resp 16 | Ht 64.0 in | Wt 145.8 lb

## 2019-10-30 DIAGNOSIS — N6310 Unspecified lump in the right breast, unspecified quadrant: Secondary | ICD-10-CM

## 2019-10-30 MED ORDER — TRAMADOL HCL 50 MG PO TABS: ORAL_TABLET | ORAL | 0 refills | Status: DC

## 2019-11-12 ENCOUNTER — Other Ambulatory Visit: Payer: Self-pay

## 2019-11-12 ENCOUNTER — Ambulatory Visit
Admission: RE | Admit: 2019-11-12 | Discharge: 2019-11-12 | Disposition: A | Discharge status: Home / Self Care | Payer: Medicare Other | Source: Ambulatory Visit | Attending: Internal Medicine | Admitting: Internal Medicine

## 2019-11-12 ENCOUNTER — Other Ambulatory Visit: Payer: Self-pay | Admitting: Internal Medicine

## 2019-11-12 DIAGNOSIS — N6310 Unspecified lump in the right breast, unspecified quadrant: Secondary | ICD-10-CM

## 2019-11-15 ENCOUNTER — Ambulatory Visit
Admission: RE | Admit: 2019-11-15 | Discharge: 2019-11-15 | Disposition: A | Discharge status: Home / Self Care | Payer: Medicare Other | Source: Ambulatory Visit | Attending: Internal Medicine | Admitting: Internal Medicine

## 2019-11-15 ENCOUNTER — Other Ambulatory Visit: Payer: Self-pay

## 2019-11-15 DIAGNOSIS — N6310 Unspecified lump in the right breast, unspecified quadrant: Secondary | ICD-10-CM

## 2019-11-16 ENCOUNTER — Encounter: Payer: Self-pay | Admitting: *Deleted

## 2019-11-16 ENCOUNTER — Telehealth: Payer: Self-pay | Admitting: Hematology and Oncology

## 2019-11-16 HISTORY — PX: BREAST BIOPSY: SHX20

## 2019-11-16 NOTE — Telephone Encounter (Signed)
Received a new pt referral from the breast center for invasive mammary carcinoma. Misty Blackwell has been scheduled to see Dr. Lindi Adie on 7/21 at 345pm. The appt date and time has been given to the pt's niece who's aware to arrive 15 minutes early.

## 2019-11-19 ENCOUNTER — Ambulatory Visit (INDEPENDENT_AMBULATORY_CARE_PROVIDER_SITE_OTHER): Payer: Medicare Other | Admitting: Cardiovascular Disease

## 2019-11-19 ENCOUNTER — Encounter: Payer: Self-pay | Admitting: Cardiovascular Disease

## 2019-11-19 ENCOUNTER — Ambulatory Visit (INDEPENDENT_AMBULATORY_CARE_PROVIDER_SITE_OTHER): Payer: Medicare Other | Admitting: Pharmacist

## 2019-11-19 ENCOUNTER — Other Ambulatory Visit: Payer: Self-pay

## 2019-11-19 VITALS — BP 157/82 | HR 85 | Ht 70.0 in | Wt 144.2 lb

## 2019-11-19 DIAGNOSIS — Z7901 Long term (current) use of anticoagulants: Secondary | ICD-10-CM

## 2019-11-19 DIAGNOSIS — I4821 Permanent atrial fibrillation: Secondary | ICD-10-CM | POA: Diagnosis not present

## 2019-11-19 DIAGNOSIS — I482 Chronic atrial fibrillation, unspecified: Secondary | ICD-10-CM | POA: Diagnosis not present

## 2019-11-19 DIAGNOSIS — I5032 Chronic diastolic (congestive) heart failure: Secondary | ICD-10-CM

## 2019-11-19 DIAGNOSIS — I10 Essential (primary) hypertension: Secondary | ICD-10-CM

## 2019-11-19 DIAGNOSIS — E782 Mixed hyperlipidemia: Secondary | ICD-10-CM

## 2019-11-19 DIAGNOSIS — Z950 Presence of cardiac pacemaker: Secondary | ICD-10-CM

## 2019-11-19 LAB — POCT INR: INR: 2.8 (ref 2.0–3.0)

## 2019-11-19 MED ORDER — TELMISARTAN 80 MG PO TABS: 80.0000 mg | ORAL_TABLET | Freq: Every day | ORAL | 3 refills | Status: DC

## 2019-11-19 NOTE — Progress Notes (Signed)
[name], female   DOB: [birth year], 86 y.o.   MRN: [id number]    Cardiology Office Note    Date:  11/19/2019   ID:  Francenia Chimenti, DOB 01-12-1928, MRN [id number]  PCP:  [name], MD  Cardiologist:   Sanda Klein, MD   Chief Complaint  Patient presents with  . Pacemaker Check  . Atrial Fibrillation    History of Present Illness:  Misty Blackwell is a 86 y.o. female who presents for follow-up of permanent atrial fibrillation with slow ventricular response and pacemaker ([employer]tr[employer]), remote TIA, HTN, minor CAD, asymptomatic pacemaker-detected NSVT, history of diastolic HF.  She had an episode of dyspnea and chest pressure associated with ankle swelling about a month ago, better after diuretics. She takes the furosemide intermittently, 3-4 times a month, based on ankle swelling. Otherwise denies angina or dyspnea with exertion, palpitations, persistent edema, focal neuro events, bleeding, falls or injuries. Continues to live independently.  She brings a log of BP (150-170/80s) and HR (80s) and weight daily (140-141 lb) over the last 2 weeks. BP has increased since we backed off the dose of diltiazem to limit the amount of V pacing.  Was diagnosed with a small malignant breast tumor (1.5 cm, invasive carcinoma) and is going to the [facility] next week.  Pacemaker interrogation performed today shows normal device function. She has about 70% V pacing and occasional episodes of RVR, but mostly well rate controlled. Not device dependent. Estimated generator longevity is 25 months.  In 2016 she underwent echocardiography and nuclear stress testing.  Left ventricular systolic function was normal, she had a fixed moderate apical defect but without reversible ischemia.  Moderate tricuspid regurgitation and moderate pulmonary artery hypertension with estimated systolic PA pressure of 58 mmHg.  In April 2019 she had a nuclear stress test showing normal  perfusion and an echocardiogram which showed unchanged findings of mild LVH, normal left ventricular systolic function, mild left atrial dilation, improved degree of pulmonary hypertension 41 mmHg.  She does not have any signs or symptoms of COVID-19 infection.  Past Medical History:  Diagnosis Date  . Atrial fib/flutter, transient   . Atrial fibrillation, chronic ([place])   . CHF (congestive heart failure) ([place]) 10/29/2009   Echo - EF >55%; normal LV size and systolic function; unable to assess diastolic fcn due to E/A fusion, pulmonary vein flow pattern suggests elevated filling pressure; marked biatrail dilation, mild/mod tricuspid regurgitation; mod pulmonary htn; mild/mod mitral regurgitation; although echocardiographic features are incomplete findings suggest possible infiltrative cardiomyopathy (maybe amyloidosi  . Coronary artery disease 03/19/2002   R/P Cardiolite - EF 76%; nromal static and dynamic myocardial perfusion images; normal wall motion and endocardial thickening in all vascular territories  . Facial numbness 12/26/2008   carotid doppler - R and L ICAs 0-49% diameter reduction (velocities suggest low end of scale)  . Hypertension   . Pacemaker   . Peripheral neuropathy   . Skin cancer    s/p surgical removal.  . TIA (transient ischemic attack)     Past Surgical History:  Procedure Laterality Date  . ABDOMINAL HYSTERECTOMY    . APPENDECTOMY    . CARDIAC CATHETERIZATION  08/06/2005   minimal coronary disease predominant RCA; no significant atherosclerosis; new onset sick sinus syndrome and atrial flutter w/ ventricular response, controlled on med therapy; systemic HTN, normal renal arteries  . CARDIOVERSION  11/19/2009   successful DCCV from AF to sinus type rhythm  . CHOLECYSTECTOMY    .  Skin cancer resection      Outpatient Medications Prior to Visit  Medication Sig Dispense Refill  . Ascorbic Acid (VITAMIN C) 1000 MG tablet Take 2,000 mg by mouth daily.    .  Cyanocobalamin (VITAMIN B 12 PO) Take 1,000 mcg by mouth daily.     Marland Kitchen diltiazem (CARDIZEM CD) 180 MG 24 hr capsule TAKE 1 CAPSULE BY MOUTH EVERY DAY 90 capsule 2  . furosemide (LASIX) 40 MG tablet Take 1 tablet (40 mg total) by mouth daily. (Patient taking differently: Take 20 mg by mouth daily as needed for fluid. ) 90 tablet 3  . IRON PO Take 65 mg by mouth daily.     Marland Kitchen MAGNESIUM PO Take 250 mg by mouth daily.     . nitroGLYCERIN (NITROSTAT) 0.4 MG SL tablet Place 0.4 mg under the tongue every 5 (five) minutes as needed for chest pain.    . potassium chloride SA (KLOR-CON) 20 MEQ tablet Take 1 tablet daily for potassium replacement. 90 tablet 3  . traMADol (ULTRAM) 50 MG tablet Take 1 tablet every 4 hours as needed for Pain 12 tablet 0  . VITAMIN D PO Take 5,000 Units by mouth daily.    Marland Kitchen warfarin (COUMADIN) 3 MG tablet TAKE 1 TO 1 AND 1/2 TABLET BY MOUTH AS DIRECTED 135 tablet 1  . telmisartan (MICARDIS) 40 MG tablet Take 1 tablet Daily for BP & Heart 90 tablet 3   No facility-administered medications prior to visit.     Allergies:   Latex, Ace inhibitors, Augmentin [amoxicillin-pot clavulanate], Ciprofloxacin, Levaquin [levofloxacin in d5w], Zocor [simvastatin], Acrylic polymer [carbomer], Chocolate, and Gabapentin   Social History   Socioeconomic History  . Marital status: Widowed    Spouse name: Not on file  . Number of children: 0  . Years of education: Not on file  . Highest education level: Not on file  Occupational History  . Not on file  Tobacco Use  . Smoking status: Never Smoker  . Smokeless tobacco: Never Used  Vaping Use  . Vaping Use: Never used  Substance and Sexual Activity  . Alcohol use: No  . Drug use: No  . Sexual activity: Never  Other Topics Concern  . Not on file  Social History Narrative   Widowed.  Lives alone.  Ambulates independently.   Social Determinants of Health   Financial Resource Strain:   . Difficulty of Paying Living Expenses:   Food  Insecurity:   . Worried About Charity fundraiser in the Last Year:   . Arboriculturist in the Last Year:   Transportation Needs:   . Film/video editor (Medical):   Marland Kitchen Lack of Transportation (Non-Medical):   Physical Activity:   . Days of Exercise per Week:   . Minutes of Exercise per Session:   Stress:   . Feeling of Stress :   Social Connections:   . Frequency of Communication with Friends and Family:   . Frequency of Social Gatherings with Friends and Family:   . Attends Religious Services:   . Active Member of Clubs or Organizations:   . Attends Archivist Meetings:   Marland Kitchen Marital Status:      Family History:  The patient's family history includes Cirrhosis in her brother; Diabetes in her mother; Heart attack in her father; Heart disease in her father and mother.   ROS:   Please see the history of present illness.    ROS All other systems are  reviewed and are negative.   PHYSICAL EXAM:   VS:  BP (!) 157/82   Pulse 85   Ht 5\' 10"  (1.778 m)   Wt 144 lb 3.2 oz (65.4 kg)   SpO2 96%   BMI 20.69 kg/m      General: Alert, oriented x3, no distress, healthy L subclavian PM site Head: no evidence of trauma, PERRL, EOMI, no exophtalmos or lid lag, no myxedema, no xanthelasma; normal ears, nose and oropharynx Neck: normal jugular venous pulsations and no hepatojugular reflux; brisk carotid pulses without delay and no carotid bruits Chest: clear to auscultation, no signs of consolidation by percussion or palpation, normal fremitus, symmetrical and full respiratory excursions Cardiovascular: normal position and quality of the apical impulse, regular rhythm, normal first and paradoxically split second heart sounds, no murmurs, rubs or gallops Abdomen: no tenderness or distention, no masses by palpation, no abnormal pulsatility or arterial bruits, normal bowel sounds, no hepatosplenomegaly Extremities: no clubbing, cyanosis or edema; 2+ radial, ulnar and brachial pulses  bilaterally; 2+ right femoral, posterior tibial and dorsalis pedis pulses; 2+ left femoral, posterior tibial and dorsalis pedis pulses; no subclavian or femoral bruits Neurological: grossly nonfocal Psych: Normal mood and affect    Wt Readings from Last 3 Encounters:  11/19/19 144 lb 3.2 oz (65.4 kg)  10/30/19 145 lb 12.8 oz (66.1 kg)  09/25/19 143 lb 12.8 oz (65.2 kg)      Studies/Labs Reviewed:   EKG:  EKG is not ordered today.  ECG from 06/25/2019 atrial fibrillation and prominent T wave inversion in the inferior and anterolateral leads (unchanged). Intracardiac electrogram today shows mostly V paced rhythm. Recent Labs: 06/22/2019: B Natriuretic Peptide 239.2 09/25/2019: ALT 12; BUN 21; Creat 0.83; Hemoglobin 13.2; Magnesium 2.2; Platelets 228; Potassium 4.6; Sodium 142; TSH 1.48   Lipid Panel    Component Value Date/Time   CHOL 139 09/25/2019 1037   TRIG 160 (H) 09/25/2019 1037   HDL 44 (L) 09/25/2019 1037   CHOLHDL 3.2 09/25/2019 1037   VLDL 34 (H) 09/28/2016 1125   LDLCALC 71 09/25/2019 1037     ASSESSMENT:    1. Permanent atrial fibrillation ([place])   2. Chronic diastolic heart failure ([place])   3. Essential hypertension   4. Long term current use of anticoagulant therapy   5. Pacemaker   6. Hyperlipidemia, mixed      PLAN:  In order of problems listed above:  1. AFib with slow ventricular response: adequate rate control, occasional RVR. Keep on current diltiazem dose  She has high embolic risk with history of previous transient ischemic attack (CHADSVasc 8: age 37, TIA 2, HTN, CAD, CHF, gender).  She is compliant with anticoagulation. 2. CHF: intermittently symptomatic, only uses furosemide prn. Recommended to take furosemide if weight is >143 lb, whether or not she has swelling. 3. HTN:   Increase telmisartan to 80 mg daily and send log of BP in 2 weeks. 4. Warfarin: Denies bleeding complications.  INR 2.8 today. 5. PPM: She is not device dependent.  Normal  device function.  Continue remote downloads every 3 months and yearly office visits. 6. HLP: could not tolerate other, more potent statins. Target LDL<100 appears appropriate.   Medication Adjustments/Labs and Tests Ordered: Current medicines are reviewed at length with the patient today.  Concerns regarding medicines are outlined above.  Medication changes, Labs and Tests ordered today are listed in the Patient Instructions below. Patient Instructions  Medication Instructions:  INCREASE the Telmisartan to 80 mg once daily  *  If you need a refill on your cardiac medications before your next appointment, please call your pharmacy*   Lab Work: None ordered If you have labs (blood work) drawn today and your tests are completely normal, you will receive your results only by: Marland Kitchen MyChart Message (if you have MyChart) OR . A paper copy in the mail If you have any lab test that is abnormal or we need to change your treatment, we will call you to review the results.   Testing/Procedures: None ordered   Follow-Up: At [facility], you and your health needs are our priority.  As part of our continuing mission to provide you with exceptional heart care, we have created designated Provider Care Teams.  These Care Teams include your primary Cardiologist (physician) and Advanced Practice Providers (APPs -  Physician Assistants and Nurse Practitioners) who all work together to provide you with the care you need, when you need it.  We recommend signing up for the patient portal called "MyChart".  Sign up information is provided on this After Visit Summary.  MyChart is used to connect with patients for Virtual Visits (Telemedicine).  Patients are able to view lab/test results, encounter notes, upcoming appointments, etc.  Non-urgent messages can be sent to your provider as well.   To learn more about what you can do with MyChart, go to [email or web address].    Your next appointment:   12  month(s)  The format for your next appointment:   In Person  Provider:   Sanda Klein, MD       Signed, Sanda Klein, MD  11/19/2019 10:40 AM    [facility] [place], [place], [place]  [address] Phone: [phone]; Fax: [phone]

## 2019-11-19 NOTE — Patient Instructions (Signed)
Medication Instructions:  INCREASE the Telmisartan to 80 mg once daily  *If you need a refill on your cardiac medications before your next appointment, please call your pharmacy*   Lab Work: None ordered If you have labs (blood work) drawn today and your tests are completely normal, you will receive your results only by:  [place] (if you have MyChart) OR  A paper copy in the mail If you have any lab test that is abnormal or we need to change your treatment, we will call you to review the results.   Testing/Procedures: None ordered   Follow-Up: At [facility], you and your health needs are our priority.  As part of our continuing mission to provide you with exceptional heart care, we have created designated Provider Care Teams.  These Care Teams include your primary Cardiologist (physician) and Advanced Practice Providers (APPs -  Physician Assistants and Nurse Practitioners) who all work together to provide you with the care you need, when you need it.  We recommend signing up for the patient portal called "MyChart".  Sign up information is provided on this After Visit Summary.  MyChart is used to connect with patients for Virtual Visits (Telemedicine).  Patients are able to view lab/test results, encounter notes, upcoming appointments, etc.  Non-urgent messages can be sent to your provider as well.   To learn more about what you can do with MyChart, go to [email or web address].    Your next appointment:   12 month(s)  The format for your next appointment:   In Person  Provider:   Sanda Klein, MD

## 2019-11-21 ENCOUNTER — Other Ambulatory Visit: Payer: Self-pay

## 2019-11-21 ENCOUNTER — Encounter: Payer: Self-pay | Admitting: Adult Health

## 2019-11-21 ENCOUNTER — Inpatient Hospital Stay: Payer: Medicare Other | Attending: Hematology and Oncology | Admitting: Hematology and Oncology

## 2019-11-21 DIAGNOSIS — Z7901 Long term (current) use of anticoagulants: Secondary | ICD-10-CM | POA: Diagnosis not present

## 2019-11-21 DIAGNOSIS — I272 Pulmonary hypertension, unspecified: Secondary | ICD-10-CM | POA: Diagnosis not present

## 2019-11-21 DIAGNOSIS — I482 Chronic atrial fibrillation, unspecified: Secondary | ICD-10-CM | POA: Insufficient documentation

## 2019-11-21 DIAGNOSIS — I10 Essential (primary) hypertension: Secondary | ICD-10-CM

## 2019-11-21 DIAGNOSIS — C50411 Malignant neoplasm of upper-outer quadrant of right female breast: Secondary | ICD-10-CM | POA: Diagnosis not present

## 2019-11-21 DIAGNOSIS — Z90710 Acquired absence of both cervix and uterus: Secondary | ICD-10-CM | POA: Insufficient documentation

## 2019-11-21 DIAGNOSIS — Z950 Presence of cardiac pacemaker: Secondary | ICD-10-CM | POA: Diagnosis not present

## 2019-11-21 DIAGNOSIS — I509 Heart failure, unspecified: Secondary | ICD-10-CM | POA: Diagnosis not present

## 2019-11-21 DIAGNOSIS — Z85828 Personal history of other malignant neoplasm of skin: Secondary | ICD-10-CM

## 2019-11-21 DIAGNOSIS — I110 Hypertensive heart disease with heart failure: Secondary | ICD-10-CM | POA: Insufficient documentation

## 2019-11-21 DIAGNOSIS — Z170 Estrogen receptor positive status [ER+]: Secondary | ICD-10-CM

## 2019-11-21 MED ORDER — ANASTROZOLE 1 MG PO TABS
1.0000 mg | ORAL_TABLET | Freq: Every day | ORAL | 3 refills | Status: DC
Start: 2019-11-21 — End: 2020-06-26

## 2019-11-21 NOTE — Progress Notes (Signed)
[place] NOTE  Patient Care Team: [name], MD as PCP - General (Internal Medicine) [name], MD as Consulting Physician (Urology) [name], [name], MD as Consulting Physician (Neurology) [name], MD as Consulting Physician (Neurology) [name], [name], MD as Consulting Physician (Cardiology) [name], MD (Inactive) as Consulting Physician (Gastroenterology) [name], RN as Oncology Nurse Navigator [name], RN as Oncology Nurse Navigator  CHIEF COMPLAINTS/PURPOSE OF CONSULTATION:  Newly diagnosed breast cancer  HISTORY OF PRESENTING ILLNESS:  Misty Blackwell 86 y.o. female is here because of recent diagnosis of right breast cancer. Patient palpated a right breast lump. Diagnostic mammogram and [place] on 11/12/19 showed a 1.5cm mass at the 12 o'clock position in the right breast and no evidence of malignancy in the left breast or bilateral axillas. Biopsy on 11/15/19 showed invasive mammary carcinoma, grade 2, HER-2 negative (1+), ER+ 80%, PR+ 50%, Ki67 5%. She presents to the clinic today for initial evaluation and discussion of treatment options.   I reviewed her records extensively and collaborated the history with the patient.  SUMMARY OF ONCOLOGIC HISTORY: Oncology History  Malignant neoplasm of upper-outer quadrant of right breast in female, estrogen receptor positive ([name])  11/15/2019 Cancer Staging   Staging form: Breast, AJCC 8th Edition - Clinical stage from 11/15/2019: Stage IA (cT1c, cN0, cM0, G2, ER+, PR+, HER2-) - Signed by [name], NP on 11/21/2019   11/15/2019 Initial Biopsy   Palpable right breast mass: 1.5 cm at 12 o'clock position, benign intramammary lymph node, axilla negative, right breast biopsy 1 grade 2 ER 80%, PR 50%, Ki-67 5%, HER-2 negative      MEDICAL HISTORY:  Past Medical History:  Diagnosis Date  . Atrial fib/flutter, transient   . Atrial fibrillation, chronic  ([place])   . CHF (congestive heart failure) ([place]) 10/29/2009   Echo - EF >55%; normal LV size and systolic function; unable to assess diastolic fcn due to E/A fusion, pulmonary vein flow pattern suggests elevated filling pressure; marked biatrail dilation, mild/mod tricuspid regurgitation; mod pulmonary htn; mild/mod mitral regurgitation; although echocardiographic features are incomplete findings suggest possible infiltrative cardiomyopathy (maybe amyloidosi  . Coronary artery disease 03/19/2002   R/P Cardiolite - EF 76%; nromal static and dynamic myocardial perfusion images; normal wall motion and endocardial thickening in all vascular territories  . Facial numbness 12/26/2008   carotid doppler - R and L ICAs 0-49% diameter reduction (velocities suggest low end of scale)  . Hypertension   . Pacemaker   . Peripheral neuropathy   . Skin cancer    s/p surgical removal.  . TIA (transient ischemic attack)     SURGICAL HISTORY: Past Surgical History:  Procedure Laterality Date  . ABDOMINAL HYSTERECTOMY    . APPENDECTOMY    . CARDIAC CATHETERIZATION  08/06/2005   minimal coronary disease predominant RCA; no significant atherosclerosis; new onset sick sinus syndrome and atrial flutter w/ ventricular response, controlled on med therapy; systemic HTN, normal renal arteries  . CARDIOVERSION  11/19/2009   successful DCCV from AF to sinus type rhythm  . CHOLECYSTECTOMY    . Skin cancer resection      SOCIAL HISTORY: Social History   Socioeconomic History  . Marital status: Widowed    Spouse name: Not on file  . Number of children: 0  . Years of education: Not on file  . Highest education level: Not on file  Occupational History  . Not on file  Tobacco Use  .  Smoking status: Never Smoker  . Smokeless tobacco: Never Used  Vaping Use  . Vaping Use: Never used  Substance and Sexual Activity  . Alcohol use: No  . Drug use: No  . Sexual activity: Never  Other Topics Concern  . Not on file   Social History Narrative   Widowed.  Lives alone.  Ambulates independently.   Social Determinants of Health   Financial Resource Strain:   . Difficulty of Paying Living Expenses:   Food Insecurity:   . Worried About Charity fundraiser in the Last Year:   . Arboriculturist in the Last Year:   Transportation Needs:   . Film/video editor (Medical):   Marland Kitchen Lack of Transportation (Non-Medical):   Physical Activity:   . Days of Exercise per Week:   . Minutes of Exercise per Session:   Stress:   . Feeling of Stress :   Social Connections:   . Frequency of Communication with Friends and Family:   . Frequency of Social Gatherings with Friends and Family:   . Attends Religious Services:   . Active Member of Clubs or Organizations:   . Attends Archivist Meetings:   Marland Kitchen Marital Status:   Intimate Partner Violence:   . Fear of Current or Ex-Partner:   . Emotionally Abused:   Marland Kitchen Physically Abused:   . Sexually Abused:     FAMILY HISTORY: Family History  Problem Relation Age of Onset  . Heart disease Mother   . Diabetes Mother   . Heart attack Father   . Heart disease Father   . Cirrhosis Brother     ALLERGIES:  is allergic to latex, ace inhibitors, augmentin [amoxicillin-pot clavulanate], ciprofloxacin, levaquin [levofloxacin in d5w], zocor [simvastatin], acrylic polymer [carbomer], chocolate, and gabapentin.  MEDICATIONS:  Current Outpatient Medications  Medication Sig Dispense Refill  . Ascorbic Acid (VITAMIN C) 1000 MG tablet Take 2,000 mg by mouth daily.    . Cyanocobalamin (VITAMIN B 12 PO) Take 1,000 mcg by mouth daily.     Marland Kitchen diltiazem (CARDIZEM CD) 180 MG 24 hr capsule TAKE 1 CAPSULE BY MOUTH EVERY DAY 90 capsule 2  . furosemide (LASIX) 40 MG tablet Take 1 tablet (40 mg total) by mouth daily. (Patient taking differently: Take 20 mg by mouth daily as needed for fluid. ) 90 tablet 3  . IRON PO Take 65 mg by mouth daily.     Marland Kitchen MAGNESIUM PO Take 250 mg by mouth  daily.     . nitroGLYCERIN (NITROSTAT) 0.4 MG SL tablet Place 0.4 mg under the tongue every 5 (five) minutes as needed for chest pain.    . potassium chloride SA (KLOR-CON) 20 MEQ tablet Take 1 tablet daily for potassium replacement. 90 tablet 3  . telmisartan (MICARDIS) 80 MG tablet Take 1 tablet (80 mg total) by mouth daily. 90 tablet 3  . traMADol (ULTRAM) 50 MG tablet Take 1 tablet every 4 hours as needed for Pain 12 tablet 0  . VITAMIN D PO Take 5,000 Units by mouth daily.    Marland Kitchen warfarin (COUMADIN) 3 MG tablet TAKE 1 TO 1 AND 1/2 TABLET BY MOUTH AS DIRECTED 135 tablet 1   No current facility-administered medications for this visit.    REVIEW OF SYSTEMS:   Constitutional: Denies fevers, chills or abnormal night sweats Eyes: Denies blurriness of vision, double vision or watery eyes Ears, nose, mouth, throat, and face: Denies mucositis or sore throat Respiratory: Denies cough, dyspnea or wheezes  Cardiovascular: Denies palpitation, chest discomfort or lower extremity swelling Gastrointestinal:  Denies nausea, heartburn or change in bowel habits Skin: Denies abnormal skin rashes Lymphatics: Denies new lymphadenopathy or easy bruising Neurological:Denies numbness, tingling or new weaknesses Behavioral/Psych: Mood is stable, no new changes  Breast: palpable right breast mass All other systems were reviewed with the patient and are negative.  PHYSICAL EXAMINATION: ECOG PERFORMANCE STATUS: 1 - Symptomatic but completely ambulatory  Vitals:   11/21/19 1541  BP: (!) 168/84  Pulse: 77  Resp: 18  Temp: 98.9 F (37.2 C)  SpO2: 97%   Filed Weights   11/21/19 1541  Weight: 144 lb 6.4 oz (65.5 kg)    GENERAL:alert, no distress and comfortable SKIN: skin color, texture, turgor are normal, no rashes or significant lesions EYES: normal, conjunctiva are pink and non-injected, sclera clear OROPHARYNX:no exudate, no erythema and lips, buccal mucosa, and tongue normal  NECK: supple, thyroid  normal size, non-tender, without nodularity LYMPH:  no palpable lymphadenopathy in the cervical, axillary or inguinal LUNGS: clear to auscultation and percussion with normal breathing effort HEART: regular rate & rhythm and no murmurs and no lower extremity edema ABDOMEN:abdomen soft, non-tender and normal bowel sounds Musculoskeletal:no cyanosis of digits and no clubbing  PSYCH: alert & oriented x 3 with fluent speech NEURO: no focal motor/sensory deficits BREAST: Palpable lump in the right breast. No palpable axillary or supraclavicular lymphadenopathy (exam performed in the presence of a chaperone)   LABORATORY DATA:  I have reviewed the data as listed Lab Results  Component Value Date   WBC 8.1 09/25/2019   HGB 13.2 09/25/2019   HCT 40.4 09/25/2019   MCV 93.1 09/25/2019   PLT 228 09/25/2019   Lab Results  Component Value Date   NA 142 09/25/2019   K 4.6 09/25/2019   CL 103 09/25/2019   CO2 32 09/25/2019    RADIOGRAPHIC STUDIES: I have personally reviewed the radiological reports and agreed with the findings in the report.  ASSESSMENT AND PLAN:  Malignant neoplasm of upper-outer quadrant of right breast in female, estrogen receptor positive ([place]) 11/15/2019:Palpable right breast mass: 1.5 cm at 12 o'clock position, benign intramammary lymph node, axilla negative, right breast biopsy 1 grade 2 ER 80%, PR 50%, Ki-67 5%, HER-2 negative  Pathology and radiology counseling: Discussed with the patient, the details of pathology including the type of breast cancer,the clinical staging, the significance of ER, PR and HER-2/neu receptors and the implications for treatment.  Treatment plan:  1.  I sent a message to her cardiologist Dr.[name] to see if she can tolerate lumpectomy.  If she can then we will refer her to central canal surgery for surgical consultation. 2. given her age and other cardiac comorbidities, if she is not a candidate for surgery then she will take antiestrogen  therapy with anastrozole for palliative intent to keep the cancer under control.  In fact I sent her with a prescription for anastrozole to start anytime so that as a work-up is being performed we can stop the cancer growth with antiestrogen therapy.  Return to clinic in 3 months for follow-up on anastrozole.  All questions were answered. The patient knows to call the clinic with any problems, questions or concerns.   Rulon Eisenmenger, MD, MPH 11/21/2019    I, Molly Dorshimer, am acting as scribe for [name], MD.  I have reviewed the above documentation for accuracy and completeness, and I agree with the above.

## 2019-11-21 NOTE — Assessment & Plan Note (Signed)
11/15/2019:Palpable right breast mass: 1.5 cm at 12 o'clock position, benign intramammary lymph node, axilla negative, right breast biopsy 1 grade 2 ER 80%, PR 50%, Ki-67 5%, HER-2 negative  Pathology and radiology counseling: Discussed with the patient, the details of pathology including the type of breast cancer,the clinical staging, the significance of ER, PR and HER-2/neu receptors and the implications for treatment.  Treatment plan: Given her advanced age, I recommended palliative treatment with antiestrogen therapy using anastrozole 1 mg daily.  We discussed the goals of care being palliation.  As long as she is able to tolerate the treatments and as long as she is responding to the treatment will continue with the anastrozole.  If her performance status declines and we will discontinue it.  Return to clinic in 3 months for follow-up on anastrozole.

## 2019-11-22 ENCOUNTER — Encounter: Payer: Self-pay | Admitting: *Deleted

## 2019-11-23 ENCOUNTER — Encounter: Payer: Self-pay | Admitting: *Deleted

## 2019-11-23 ENCOUNTER — Telehealth: Payer: Self-pay | Admitting: Hematology and Oncology

## 2019-11-23 NOTE — Telephone Encounter (Signed)
Scheduled per 7/21 los. Called and spoke with [name], confirmed 10/21 appt

## 2019-11-30 ENCOUNTER — Encounter: Payer: Self-pay | Admitting: Cardiovascular Disease

## 2019-11-30 ENCOUNTER — Ambulatory Visit: Payer: Self-pay | Admitting: Surgery

## 2019-11-30 DIAGNOSIS — C50911 Malignant neoplasm of unspecified site of right female breast: Secondary | ICD-10-CM

## 2019-11-30 NOTE — H&P (Signed)
History of Present Illness Misty Blackwell. Misty Wachs MD; 11/30/2019 3:03 PM) The patient is a 86 year old female who presents with breast cancer. PCP - Misty Blackwell [facility] - Misty Blackwell.  Reason - Right IDC  This is a 86 year old female with cardiac comorbidities who presented recently with a palpable right breast mass. She was found to have a 1.5 cm mass at 12:00 in the right breast. Left breast and axillae were negative. She is chronically anticoagulated on warfarin. The patient is not a candidate for chemotherapy but she was referred to [place] for consideration of a lumpectomy without sentinel lymph node. I discussed her case with her cardiologist who feels that she is an acceptable risk for surgery but needs to work on a Lovenox bridge. His office will coordinate the Lovenox.   Path - Invasive ductal carcinoma ER/PR +, Ki67 - 5%, Her 2 negative CLINICAL DATA: Patient has a palpable lump in the upper slightly inner aspect of the right breast.  EXAM: DIGITAL DIAGNOSTIC BILATERAL MAMMOGRAM WITH CAD AND TOMO  ULTRASOUND BILATERAL BREAST  COMPARISON: None.  ACR Breast Density Category b: There are scattered areas of fibroglandular density.  FINDINGS: There is an ill-defined mass with associated architectural distortion in the upper posterior right breast corresponding to the palpable abnormality. There is a small oval circumscribed mass in the lower inner quadrant of the right breast. On the left, there is a circumscribed mass in the retroareolar region.  Mammographic images were processed with CAD.  On physical exam, there is a firm tender mass in the upper slightly inner aspect of the right breast. No other defined masses.  Targeted right breast ultrasound is performed, showing a hypoechoic irregular ill-defined mass with posterior acoustic shadowing in the right breast at 12 o'clock, 5 cm the nipple, measuring 1.5 x 0.9 x 0.9 cm. In the 3 o'clock position, 4 cm  from the nipple, there is an oval circumscribed mass with a small notch of eccentric fat measuring 4 x 2 x 3 mm, consistent with an intramammary lymph node and consistent in size, shape and location to the small circumscribed mass seen in the medial right breast mammographically. In the right axilla, there are no enlarged or abnormal lymph nodes.  Targeted left breast ultrasound is performed, showing an oval cyst in the retroareolar breast at 6 o'clock, 1 cm from the nipple, measuring 8 x 6 x 5 mm. There is a second smaller cyst also in the retroareolar region measuring 4 mm in diameter. No solid masses or suspicious lesions.  IMPRESSION: 1. 1.5 cm mass in the 12 o'clock position of the right breast corresponding to the palpable abnormality. This is highly suspicious for breast malignancy. Tissue sampling is indicated. No other evidence of breast malignancy. 2. Normal intramammary lymph node in the medial right breast. 3. Benign cysts in the retroareolar left breast.  RECOMMENDATION: 1. Ultrasound-guided core needle biopsy of the 12 o'clock position right breast mass. This has been scheduled for Thursday, July 15th, 10:30 a.m.  I have discussed the findings and recommendations with the patient. If applicable, a reminder letter will be sent to the patient regarding the next appointment.  BI-RADS CATEGORY 5: Highly suggestive of malignancy.   Electronically Signed By: Misty Blackwell M.D. On: 11/12/2019 16:30    Problem List/Past Medical Misty Key [name], MD; 11/30/2019 3:04 PM) INVASIVE DUCTAL CARCINOMA OF RIGHT BREAST, STAGE 1 (C50.911)  Past Surgical History Misty Blackwell, [place]; 11/30/2019 11:34 AM) Breast Biopsy Right. Cataract Surgery  Bilateral. Hysterectomy (not due to cancer) - Partial  Diagnostic Studies History Misty Blackwell, [place]; 11/30/2019 11:34 AM) Colonoscopy 5-10 years ago Mammogram within last year Pap Smear >5 years ago  Allergies Misty Blackwell, RMA; 11/30/2019 11:35 AM) No Known Drug Allergies [11/30/2019]: Allergies Reconciled  Medication History Misty Blackwell, [place]; 11/30/2019 11:38 AM) Anastrozole (1MG Tablet, Oral) Active. Telmisartan (80MG Tablet, Oral) Active. Warfarin Sodium (3MG Tablet, Oral) Active. dilTIAZem HCl ER Coated Beads (180MG Capsule ER 24HR, Oral) Active. Potassium Chloride Crys ER (20MEQ Tablet ER, Oral) Active. Magnesium (250MG Tablet, Oral) Active. Vitamin D (50 MCG(2000 UT) Tablet, Oral) Active.  Social History Misty Blackwell, [place]; 11/30/2019 11:34 AM) Caffeine use Coffee. No drug use Tobacco use Never smoker.  Family History Misty Blackwell, [place]; 11/30/2019 11:34 AM) Alcohol Abuse Brother. Arthritis Mother. Diabetes Mellitus Mother. Heart Disease Father, Mother. Hypertension Father, Mother, Sister. Malignant Neoplasm Of Pancreas Father.  Pregnancy / Birth History Misty Blackwell, [place]; 11/30/2019 11:34 AM) Age of menopause <47  Other Problems Misty Blackwell. Misty Termine, MD; 11/30/2019 3:04 PM) Breast Cancer Cerebrovascular Accident High blood pressure Kidney Stone Lump In Breast     Review of Systems Misty Blackwell RMA; 11/30/2019 11:34 AM) General Not Present- Appetite Loss, Chills, Fatigue, Fever, Night Sweats, Weight Gain and Weight Loss. Skin Not Present- Change in Wart/Mole, Dryness, Hives, Jaundice, New Lesions, Non-Healing Wounds, Rash and Ulcer. HEENT Not Present- Earache, Hearing Loss, Hoarseness, Nose Bleed, Oral Ulcers, Ringing in the Ears, Seasonal Allergies, Sinus Pain, Sore Throat, Visual Disturbances, Wears glasses/contact lenses and Yellow Eyes. Respiratory Not Present- Bloody sputum, Chronic Cough, Difficulty Breathing, Snoring and Wheezing. Breast Present- Breast Mass. Not Present- Breast Pain, Nipple Discharge and Skin Changes. Cardiovascular Present- Shortness of Breath and Swelling of Extremities. Not Present- Chest Pain, Difficulty Breathing Lying  Down, Leg Cramps, Palpitations and Rapid Heart Rate. Gastrointestinal Not Present- Abdominal Pain, Bloating, Bloody Stool, Change in Bowel Habits, Chronic diarrhea, Constipation, Difficulty Swallowing, Excessive gas, Gets full quickly at meals, Hemorrhoids, Indigestion, Nausea, Rectal Pain and Vomiting. Female Genitourinary Not Present- Frequency, Nocturia, Painful Urination, Pelvic Pain and Urgency. Musculoskeletal Not Present- Back Pain, Joint Pain, Joint Stiffness, Muscle Pain, Muscle Weakness and Swelling of Extremities. Neurological Not Present- Decreased Memory, Fainting, Headaches, Numbness, Seizures, Tingling, Tremor, Trouble walking and Weakness. Psychiatric Present- Anxiety. Not Present- Bipolar, Change in Sleep Pattern, Depression, Fearful and Frequent crying. Endocrine Not Present- Cold Intolerance, Excessive Hunger, Hair Changes, Heat Intolerance, Hot flashes and New Diabetes. Hematology Present- Blood Thinners and Easy Bruising. Not Present- Excessive bleeding, Gland problems, HIV and Persistent Infections.  Vitals Lattie Haw McCarr RMA; 11/30/2019 11:39 AM) 11/30/2019 11:38 AM Weight: 144.5 lb Height: 64in Body Surface Area: 1.7 m Body Mass Index: 24.8 kg/m  Temp.: 97.42F  Pulse: 87 (Regular)  P.OX: 95% (Room air) BP: 138/78(Sitting, Left Arm, Standard)        Physical Exam Misty Key K. Remijio Holleran MD; 11/30/2019 3:04 PM)  The physical exam findings are as follows: Note:Constitutional: WDWN in NAD, conversant, no obvious deformities; resting comfortably Eyes: Pupils equal, round; sclera anicteric; moist conjunctiva; no lid lag HENT: Oral mucosa moist; good dentition Neck: No masses palpated, trachea midline; no thyromegaly Lungs: CTA bilaterally; normal respiratory effort Breasts: palpable mass at 12:00 right breast just above the nipple; some ecchymosis; tender No axillary lymphadenopathy No left breast masses CV: Regular rate and rhythm; no murmurs; extremities  well-perfused with no edema Abd: +bowel sounds, soft, non-tender, no palpable organomegaly; no palpable hernias Musc: Normal gait; no apparent clubbing or cyanosis in extremities  Lymphatic: No palpable cervical or axillary lymphadenopathy Skin: Warm, dry; no sign of jaundice Psychiatric - alert and oriented x 4; calm mood and affect     Assessment & Plan Misty Key K. Kanijah Groseclose MD; 11/30/2019 3:05 PM)  INVASIVE DUCTAL CARCINOMA OF RIGHT BREAST, STAGE 1 (C50.911)  Current Plans Schedule for Surgery - Right radioactive seed localized lumpectomy. The surgical procedure has been discussed with the patient. Potential risks, benefits, alternative treatments, and expected outcomes have been explained. All of the patient's questions at this time have been answered. The likelihood of reaching the patient's treatment goal is good. The patient understand the proposed surgical procedure and wishes to proceed.  We will excise this with radioactive seed guidance. We may be able to excise this under sedation with local anesthetic if anesthesia feels that this will be less stress on her cardiac function. We will keep her overnight after surgery to monitor her cardiac status.  Misty Blackwell. Georgette Dover, MD, [facility]  General/ Trauma Surgery   11/30/2019 3:06 PM

## 2019-11-30 NOTE — H&P (View-Only) (Signed)
History of Present Illness Misty Blackwell. Dallon Dacosta MD; 11/30/2019 3:03 PM) The patient is a 86 year old female who presents with breast cancer. PCP - [name] - [name].  Reason - Right IDC  This is a 86 year old female with cardiac comorbidities who presented recently with a palpable right breast mass. She was found to have a 1.5 cm mass at 12:00 in the right breast. Left breast and axillae were negative. She is chronically anticoagulated on warfarin. The patient is not a candidate for chemotherapy but she was referred to [place] for consideration of a lumpectomy without sentinel lymph node. I discussed her case with her cardiologist who feels that she is an acceptable risk for surgery but needs to work on a Lovenox bridge. His office will coordinate the Lovenox.   Path - Invasive ductal carcinoma ER/PR +, Ki67 - 5%, Her 2 negative CLINICAL DATA: Patient has a palpable lump in the upper slightly inner aspect of the right breast.  EXAM: DIGITAL DIAGNOSTIC BILATERAL MAMMOGRAM WITH CAD AND TOMO  ULTRASOUND BILATERAL BREAST  COMPARISON: None.  ACR Breast Density Category b: There are scattered areas of fibroglandular density.  FINDINGS: There is an ill-defined mass with associated architectural distortion in the upper posterior right breast corresponding to the palpable abnormality. There is a small oval circumscribed mass in the lower inner quadrant of the right breast. On the left, there is a circumscribed mass in the retroareolar region.  Mammographic images were processed with CAD.  On physical exam, there is a firm tender mass in the upper slightly inner aspect of the right breast. No other defined masses.  Targeted right breast ultrasound is performed, showing a hypoechoic irregular ill-defined mass with posterior acoustic shadowing in the right breast at 12 o'clock, 5 cm the nipple, measuring 1.5 x 0.9 x 0.9 cm. In the 3 o'clock position, 4 cm  from the nipple, there is an oval circumscribed mass with a small notch of eccentric fat measuring 4 x 2 x 3 mm, consistent with an intramammary lymph node and consistent in size, shape and location to the small circumscribed mass seen in the medial right breast mammographically. In the right axilla, there are no enlarged or abnormal lymph nodes.  Targeted left breast ultrasound is performed, showing an oval cyst in the retroareolar breast at 6 o'clock, 1 cm from the nipple, measuring 8 x 6 x 5 mm. There is a second smaller cyst also in the retroareolar region measuring 4 mm in diameter. No solid masses or suspicious lesions.  IMPRESSION: 1. 1.5 cm mass in the 12 o'clock position of the right breast corresponding to the palpable abnormality. This is highly suspicious for breast malignancy. Tissue sampling is indicated. No other evidence of breast malignancy. 2. Normal intramammary lymph node in the medial right breast. 3. Benign cysts in the retroareolar left breast.  RECOMMENDATION: 1. Ultrasound-guided core needle biopsy of the 12 o'clock position right breast mass. This has been scheduled for Thursday, July 15th, 10:30 a.m.  I have discussed the findings and recommendations with the patient. If applicable, a reminder letter will be sent to the patient regarding the next appointment.  BI-RADS CATEGORY 5: Highly suggestive of malignancy.   Electronically Signed By: [name] On: 11/12/2019 16:30    Problem List/Past Medical Rodman Key K. Rodneisha Bonnet, MD; 11/30/2019 3:04 PM) INVASIVE DUCTAL CARCINOMA OF RIGHT BREAST, STAGE 1 (C50.911)  Past Surgical History [name], [place]; 11/30/2019 11:34 AM) Breast Biopsy Right. Cataract Surgery  Bilateral. Hysterectomy (not due to cancer) - Partial  Diagnostic Studies History [name], [place]; 11/30/2019 11:34 AM) Colonoscopy 5-10 years ago Mammogram within last year Pap Smear >5 years ago  Allergies [name], RMA; 11/30/2019 11:35 AM) No Known Drug Allergies [11/30/2019]: Allergies Reconciled  Medication History [name], [place]; 11/30/2019 11:38 AM) Anastrozole (1MG Tablet, Oral) Active. Telmisartan (80MG Tablet, Oral) Active. Warfarin Sodium (3MG Tablet, Oral) Active. dilTIAZem HCl ER Coated Beads (180MG Capsule ER 24HR, Oral) Active. Potassium Chloride Crys ER (20MEQ Tablet ER, Oral) Active. Magnesium (250MG Tablet, Oral) Active. Vitamin D (50 MCG(2000 UT) Tablet, Oral) Active.  Social History [name], [place]; 11/30/2019 11:34 AM) Caffeine use Coffee. No drug use Tobacco use Never smoker.  Family History [name], [place]; 11/30/2019 11:34 AM) Alcohol Abuse Brother. Arthritis Mother. Diabetes Mellitus Mother. Heart Disease Father, Mother. Hypertension Father, Mother, Sister. Malignant Neoplasm Of Pancreas Father.  Pregnancy / Birth History [name], [place]; 11/30/2019 11:34 AM) Age of menopause <47  Other Problems Misty Blackwell. Amiyrah Lamere, MD; 11/30/2019 3:04 PM) Breast Cancer Cerebrovascular Accident High blood pressure Kidney Stone Lump In Breast     Review of Systems [name] RMA; 11/30/2019 11:34 AM) General Not Present- Appetite Loss, Chills, Fatigue, Fever, Night Sweats, Weight Gain and Weight Loss. Skin Not Present- Change in Wart/Mole, Dryness, Hives, Jaundice, New Lesions, Non-Healing Wounds, Rash and Ulcer. HEENT Not Present- Earache, Hearing Loss, Hoarseness, Nose Bleed, Oral Ulcers, Ringing in the Ears, Seasonal Allergies, Sinus Pain, Sore Throat, Visual Disturbances, Wears glasses/contact lenses and Yellow Eyes. Respiratory Not Present- Bloody sputum, Chronic Cough, Difficulty Breathing, Snoring and Wheezing. Breast Present- Breast Mass. Not Present- Breast Pain, Nipple Discharge and Skin Changes. Cardiovascular Present- Shortness of Breath and Swelling of Extremities. Not Present- Chest Pain, Difficulty Breathing Lying  Down, Leg Cramps, Palpitations and Rapid Heart Rate. Gastrointestinal Not Present- Abdominal Pain, Bloating, Bloody Stool, Change in Bowel Habits, Chronic diarrhea, Constipation, Difficulty Swallowing, Excessive gas, Gets full quickly at meals, Hemorrhoids, Indigestion, Nausea, Rectal Pain and Vomiting. Female Genitourinary Not Present- Frequency, Nocturia, Painful Urination, Pelvic Pain and Urgency. Musculoskeletal Not Present- Back Pain, Joint Pain, Joint Stiffness, Muscle Pain, Muscle Weakness and Swelling of Extremities. Neurological Not Present- Decreased Memory, Fainting, Headaches, Numbness, Seizures, Tingling, Tremor, Trouble walking and Weakness. Psychiatric Present- Anxiety. Not Present- Bipolar, Change in Sleep Pattern, Depression, Fearful and Frequent crying. Endocrine Not Present- Cold Intolerance, Excessive Hunger, Hair Changes, Heat Intolerance, Hot flashes and New Diabetes. Hematology Present- Blood Thinners and Easy Bruising. Not Present- Excessive bleeding, Gland problems, HIV and Persistent Infections.  Vitals Lattie Haw Lytle RMA; 11/30/2019 11:39 AM) 11/30/2019 11:38 AM Weight: 144.5 lb Height: 64in Body Surface Area: 1.7 m Body Mass Index: 24.8 kg/m  Temp.: 97.43F  Pulse: 87 (Regular)  P.OX: 95% (Room air) BP: 138/78(Sitting, Left Arm, Standard)        Physical Exam Rodman Key K. Niasia Lanphear MD; 11/30/2019 3:04 PM)  The physical exam findings are as follows: Note:Constitutional: WDWN in NAD, conversant, no obvious deformities; resting comfortably Eyes: Pupils equal, round; sclera anicteric; moist conjunctiva; no lid lag HENT: Oral mucosa moist; good dentition Neck: No masses palpated, trachea midline; no thyromegaly Lungs: CTA bilaterally; normal respiratory effort Breasts: palpable mass at 12:00 right breast just above the nipple; some ecchymosis; tender No axillary lymphadenopathy No left breast masses CV: Regular rate and rhythm; no murmurs; extremities  well-perfused with no edema Abd: +bowel sounds, soft, non-tender, no palpable organomegaly; no palpable hernias Musc: Normal gait; no apparent clubbing or cyanosis in extremities  Lymphatic: No palpable cervical or axillary lymphadenopathy Skin: Warm, dry; no sign of jaundice Psychiatric - alert and oriented x 4; calm mood and affect     Assessment & Plan Rodman Key K. Mattison Stuckey MD; 11/30/2019 3:05 PM)  INVASIVE DUCTAL CARCINOMA OF RIGHT BREAST, STAGE 1 (C50.911)  Current Plans Schedule for Surgery - Right radioactive seed localized lumpectomy. The surgical procedure has been discussed with the patient. Potential risks, benefits, alternative treatments, and expected outcomes have been explained. All of the patient's questions at this time have been answered. The likelihood of reaching the patient's treatment goal is good. The patient understand the proposed surgical procedure and wishes to proceed.  We will excise this with radioactive seed guidance. We may be able to excise this under sedation with local anesthetic if anesthesia feels that this will be less stress on her cardiac function. We will keep her overnight after surgery to monitor her cardiac status.  Misty Blackwell. Georgette Dover, MD, [facility]  General/ Trauma Surgery   11/30/2019 3:06 PM

## 2019-12-03 ENCOUNTER — Other Ambulatory Visit: Payer: Self-pay | Admitting: Surgery

## 2019-12-03 ENCOUNTER — Telehealth: Payer: Self-pay | Admitting: Cardiovascular Disease

## 2019-12-03 DIAGNOSIS — C50911 Malignant neoplasm of unspecified site of right female breast: Secondary | ICD-10-CM

## 2019-12-03 NOTE — Telephone Encounter (Signed)
I will forward to Pre OP PharmD for further advice per Pre Op Provider today in regards to needing recommendations in regards to anticoag.

## 2019-12-03 NOTE — Telephone Encounter (Signed)
Patient with diagnosis of atrial fibrillation on warfarin for anticoagulation.    Procedure: breast lumpectomy  Date of procedure: 12/13/19  CHADS2-VASc score of 8 (CHF, HTN, AGE x 2, CVA/TIA x 2 CAD, female)  CrCl 46.6 Platelet count 228  Per office protocol, patient can hold warfarin for 5 days prior to procedure.    Patient will need bridging with Lovenox (enoxaparin) around procedure.  Patient was called and appointment set for Tuesday Aug 3 to arrange lovenox bridge

## 2019-12-03 NOTE — Telephone Encounter (Signed)
Per Dr. Victorino Blackwell note her atrial fibrillation is well controlled and no anginal symptoms. She is acceptable risk for the planned procedure without further cardiovascular intervention.   I am not clear whether she needs to hold her anticoagulation prior to the planned procedure. If we could find out that would be helpful.   Loel Dubonnet, NP

## 2019-12-03 NOTE — Telephone Encounter (Signed)
New Message   Debbie from [facility] is calling and says she did get a medical clearance for the patient. She is calling to let Dr C know the procedure is scheduled 12/13/19 @ 2:45pm at [facility].    Please advise

## 2019-12-03 NOTE — Telephone Encounter (Addendum)
   Primary Cardiologist: Sanda Klein, MD  Chart reviewed as part of pre-operative protocol coverage. Given past medical history and time since last visit, based on ACC/AHA guidelines, Charnel Giles would be at acceptable risk for the planned procedure without further cardiovascular testing.   Reviewed by PharmD team. Per office protocol, patient can hold warfarin for 5 days prior to procedure.  Patient will need bridging with Lovenox (enoxaparin) around procedure. She has an appointment for 12/04/19 to arrange Lovenox. Pharmacy team has made her aware of this appointment.   I will route this recommendation to the requesting party via Epic fax function and remove from pre-op pool.  Please call with questions.  Loel Dubonnet, NP 12/03/2019, 4:55 PM

## 2019-12-03 NOTE — Telephone Encounter (Signed)
I will send call to our pre op team for further evaluation. I read Dr. Barrie Dunker ov note for clearance though I do not see that clearance was given.

## 2019-12-04 ENCOUNTER — Other Ambulatory Visit: Payer: Self-pay

## 2019-12-04 ENCOUNTER — Telehealth: Payer: Self-pay | Admitting: Hematology and Oncology

## 2019-12-04 ENCOUNTER — Ambulatory Visit (INDEPENDENT_AMBULATORY_CARE_PROVIDER_SITE_OTHER): Payer: Medicare Other | Admitting: Pharmacist Clinician (PhC)/ Clinical Pharmacy Specialist

## 2019-12-04 DIAGNOSIS — I482 Chronic atrial fibrillation, unspecified: Secondary | ICD-10-CM

## 2019-12-04 DIAGNOSIS — Z7901 Long term (current) use of anticoagulants: Secondary | ICD-10-CM

## 2019-12-04 LAB — POCT INR: INR: 2.4 (ref 2.0–3.0)

## 2019-12-04 MED ORDER — ENOXAPARIN SODIUM 100 MG/ML ~~LOC~~ SOLN: 100.0000 mg | SUBCUTANEOUS | 0 refills | Status: DC

## 2019-12-04 NOTE — Patient Instructions (Addendum)
August 6: Last dose of warfarin.  August 7: No warfarin or Lovenox.  August 8: Inject Lovenox 100 mg in the fatty abdominal tissue at least 2 inches from the belly button once daily at 8am; rotate sites. No warfarin.  August 9: Inject Lovenox in the fatty tissue once daily at 8am. No warfarin.  August 10: Inject Lovenox in the fatty tissue once daily at 8am. No warfarin.  August 11: Inject Lovenox in the fatty tissue in the morning at 8 am. No warfarin.  August 12: Procedure Day - No Lovenox.  Hospital to determine if you get warfarin this night  August 13: Resume Lovenox inject in the fatty tissue once daily at 8 am (or when you get home if the hospital does not give it to you) and take warfarin 4.5 mg.  August 14: Inject Lovenox in the fatty tissue once daily at 8 am and take warfarin 4.5 mg.  August 15: Inject Lovenox in the fatty tissue once daily at 8 am and take warfarin 4.5 mg  August 16: Inject Lovenox in the fatty tissue once daily at 8 am and take warfarin 3 mg.  August 17: Inject Lovenox in the fatty tissue once daily at 8 am and take warfarin 4.5 mg  August 18: Coumadin appt to check INR at 2:15 pm.

## 2019-12-04 NOTE — Telephone Encounter (Signed)
Scheduled appt per 8/2 sch msg - unable to reach pt . Left message with appt date and time  ° °

## 2019-12-05 ENCOUNTER — Ambulatory Visit: Payer: Medicare Other | Admitting: Physician Assistant

## 2019-12-05 NOTE — Progress Notes (Signed)
[facility] [place] [place] [address] Phone: [phone] Fax: [phone]      Your procedure is scheduled on Thursday 12/13/2019.  Report to [facility] "A" at 12:45 P.M., and check in at the Admitting office.  Call this number if you have problems the morning of surgery:  [phone]  Call [phone] if you have any questions prior to your surgery date Monday-Friday 8am-4pm    Remember:  Do not eat after midnight the night before your surgery  You may drink clear liquids until 11:45am the morning of your surgery.   Clear liquids allowed are: Water, Non-Citrus Juices (without pulp), Carbonated Beverages, Clear Tea, Black Coffee Only, and Gatorade    Take these medicines the morning of surgery with A SIP OF WATER: Anastrozole (Arimidex) Diltiazem (Cardizem CD)  If needed you may take: Nitroglycerin (Nitrostat) - if needed for chest pain   As of today, STOP taking any Aspirin (unless otherwise instructed by your surgeon) Aleve, Naproxen, Ibuprofen, Motrin, Advil, Goody's, BC's, all herbal medications, fish oil, and all vitamins. - Do not stop taking your potassium chloride SA (Klor-Con)                      Do not wear jewelry, make up, or nail polish            Do not wear lotions, powders, perfumes, or deodorant.            Do not shave 48 hours prior to surgery.  Men may shave face and neck.            Do not bring valuables to the hospital.            [facility] is not responsible for any belongings or valuables.  Do NOT Smoke (Tobacco/Vaping) or drink Alcohol 24 hours prior to your procedure  If you use a CPAP at night, you may bring all equipment for your overnight stay.   Contacts, glasses, dentures or bridgework may not be worn into surgery.      For patients admitted to the hospital, discharge time will be determined by your treatment team.    Patients discharged the day of surgery will not be allowed to drive home, and someone needs to stay with them for 24 hours.    Special instructions:   [facility]- Preparing For Surgery  Before surgery, you can play an important role. Because skin is not sterile, your skin needs to be as free of germs as possible. You can reduce the number of germs on your skin by washing with CHG (chlorahexidine gluconate) Soap before surgery.  CHG is an antiseptic cleaner which kills germs and bonds with the skin to continue killing germs even after washing.    Oral Hygiene is also important to reduce your risk of infection.  Remember - BRUSH YOUR TEETH THE MORNING OF SURGERY WITH YOUR REGULAR TOOTHPASTE  Please do not use if you have an allergy to CHG or antibacterial soaps. If your skin becomes reddened/irritated stop using the CHG.  Do not shave (including legs and underarms) for at least 48 hours prior to first CHG shower. It is OK to shave your face.  Please follow these instructions carefully.   1. Shower the NIGHT BEFORE SURGERY and the MORNING OF SURGERY with CHG Soap.   2. If you chose to wash your  hair, wash your hair first as usual with your normal shampoo.  3. After you shampoo, rinse your hair and body thoroughly to remove the shampoo.  4. Use CHG as you would any other liquid soap. You can apply CHG directly to the skin and wash gently with a scrungie or a clean washcloth.   5. Apply the CHG Soap to your body ONLY FROM THE NECK DOWN.  Do not use on open wounds or open sores. Avoid contact with your eyes, ears, mouth and genitals (private parts). Wash Face and genitals (private parts)  with your normal soap.   6. Wash thoroughly, paying special attention to the area where your surgery will be performed.  7. Thoroughly rinse your body with warm water from the neck down.  8. DO NOT shower/wash with your normal soap after using and rinsing off the CHG Soap.  9. Pat yourself dry with a  CLEAN TOWEL.  10. Wear CLEAN PAJAMAS to bed the night before surgery  11. Place CLEAN SHEETS on your bed the night of your first shower and DO NOT SLEEP WITH PETS.   Day of Surgery: Shower with CHG soap as directed Wear Clean/Comfortable clothing the morning of surgery Do not apply any deodorants/lotions.   Remember to brush your teeth WITH YOUR REGULAR TOOTHPASTE.   Please read over the following fact sheets that you were given.

## 2019-12-06 ENCOUNTER — Encounter: Payer: Self-pay | Admitting: Cardiovascular Disease

## 2019-12-06 ENCOUNTER — Encounter (HOSPITAL_COMMUNITY): Payer: Self-pay

## 2019-12-06 ENCOUNTER — Encounter: Payer: Self-pay | Admitting: *Deleted

## 2019-12-06 ENCOUNTER — Encounter (HOSPITAL_COMMUNITY)
Admission: RE | Admit: 2019-12-06 | Discharge: 2019-12-06 | Disposition: A | Discharge status: Home / Self Care | Payer: Medicare Other | Source: Ambulatory Visit | Attending: Surgery | Admitting: Surgery

## 2019-12-06 ENCOUNTER — Other Ambulatory Visit: Payer: Self-pay

## 2019-12-06 DIAGNOSIS — Z950 Presence of cardiac pacemaker: Secondary | ICD-10-CM | POA: Diagnosis not present

## 2019-12-06 DIAGNOSIS — I509 Heart failure, unspecified: Secondary | ICD-10-CM | POA: Diagnosis not present

## 2019-12-06 DIAGNOSIS — I110 Hypertensive heart disease with heart failure: Secondary | ICD-10-CM | POA: Insufficient documentation

## 2019-12-06 DIAGNOSIS — C50911 Malignant neoplasm of unspecified site of right female breast: Secondary | ICD-10-CM | POA: Diagnosis not present

## 2019-12-06 DIAGNOSIS — Z01812 Encounter for preprocedural laboratory examination: Secondary | ICD-10-CM | POA: Diagnosis not present

## 2019-12-06 DIAGNOSIS — Z79899 Other long term (current) drug therapy: Secondary | ICD-10-CM | POA: Insufficient documentation

## 2019-12-06 DIAGNOSIS — I4819 Other persistent atrial fibrillation: Secondary | ICD-10-CM | POA: Insufficient documentation

## 2019-12-06 DIAGNOSIS — I4892 Unspecified atrial flutter: Secondary | ICD-10-CM | POA: Diagnosis not present

## 2019-12-06 DIAGNOSIS — Z7901 Long term (current) use of anticoagulants: Secondary | ICD-10-CM | POA: Diagnosis not present

## 2019-12-06 DIAGNOSIS — Z8673 Personal history of transient ischemic attack (TIA), and cerebral infarction without residual deficits: Secondary | ICD-10-CM | POA: Diagnosis not present

## 2019-12-06 HISTORY — DX: Anxiety disorder, unspecified: F41.9

## 2019-12-06 HISTORY — DX: Cardiac arrhythmia, unspecified: I49.9

## 2019-12-06 HISTORY — DX: Dyspnea, unspecified: R06.00

## 2019-12-06 LAB — CBC
HCT: 44.3 % (ref 36.0–46.0)
Hemoglobin: 13.8 g/dL (ref 12.0–15.0)
MCH: 29.7 pg (ref 26.0–34.0)
MCHC: 31.2 g/dL (ref 30.0–36.0)
MCV: 95.5 fL (ref 80.0–100.0)
Platelets: 216 10*3/uL (ref 150–400)
RBC: 4.64 MIL/uL (ref 3.87–5.11)
RDW: 12.5 % (ref 11.5–15.5)
WBC: 7.8 10*3/uL (ref 4.0–10.5)
nRBC: 0 % (ref 0.0–0.2)

## 2019-12-06 LAB — BASIC METABOLIC PANEL
Anion gap: 9 (ref 5–15)
BUN: 20 mg/dL (ref 8–23)
CO2: 28 mmol/L (ref 22–32)
Calcium: 9 mg/dL (ref 8.9–10.3)
Chloride: 103 mmol/L (ref 98–111)
Creatinine, Ser: 0.73 mg/dL (ref 0.44–1.00)
GFR calc Af Amer: >60 mL/min (ref >60)
GFR calc non Af Amer: >60 mL/min (ref >60)
Glucose, Bld: 97 mg/dL (ref 70–99)
Potassium: 4.4 mmol/L (ref 3.5–5.1)
Sodium: 140 mmol/L (ref 135–145)

## 2019-12-06 LAB — PROTIME-INR
INR: 2.1 — ABNORMAL HIGH (ref 0.8–1.2)
Prothrombin Time: 22.8 seconds — ABNORMAL HIGH (ref 11.4–15.2)

## 2019-12-06 NOTE — Progress Notes (Signed)
PERIOPERATIVE PRESCRIPTION FOR IMPLANTED CARDIAC DEVICE PROGRAMMING  Patient Information: Name:  Misty Blackwell  DOB:  1927/05/11  MRN:  [id number]   Planned Procedure: Right radioactive seed localized lumpectomy  Surgeon: Dr. Donnie Mesa, MD  Date of Procedure: 12/13/2019  Cautery will be used.  Position during surgery: supine   Please send documentation back to:  [facility] (Fax # [phone])   Device Information:  Clinic EP Physician:  Sanda Klein, MD Device Type:  Pacemaker Manufacturer and Phone #:  Medtronic: [id number] Pacemaker Dependent?:  No. Date of Last Device Check:  10/17/2019 Normal Device Function?:  Yes.    Electrophysiologist's Recommendations:   Have magnet available.  Provide continuous ECG monitoring when magnet is used or reprogramming is to be performed.   Procedure may interfere with device function.  Magnet should be placed over device during procedure.  Per Device Clinic [address] [place], [place]  11:13 AM 12/06/2019

## 2019-12-06 NOTE — Progress Notes (Signed)
PCP - Dr. [name] Cardiologist - Dr. Sallyanne Kuster with [facility]  PPM/ICD - Pacer Device Orders - Received Rep Notified - Done  Chest x-ray - Not indicated EKG - 06/25/19 Stress Test - 08/05/17 ECHO - December 2019 Cardiac Cath - 08/06/2005  Sleep Study - Denies  DM  - Denies  Blood Thinner Instructions:Coumadin last dose on 12/07/19 Will start Lovenox on Sunday 12/09/19.   ERAS Protcol -Instructions given  COVID TEST- 12/10/19  Anesthesia review: Cardia hx / pacer / has clearance from Dr. Sallyanne Kuster  Patient denies shortness of breath, fever, cough and chest pain at PAT appointment   All instructions explained to the patient, with a verbal understanding of the material. Patient agrees to go over the instructions while at home for a better understanding. Patient also instructed to self quarantine after being tested for COVID-19. The opportunity to ask questions was provided.

## 2019-12-07 NOTE — Anesthesia Preprocedure Evaluation (Addendum)
Anesthesia Evaluation  Patient identified by MRN, date of birth, ID band Patient awake    Reviewed: Allergy & Precautions, NPO status , Patient's Chart, lab work & pertinent test results  Airway Mallampati: II  TM Distance: >3 FB Neck ROM: Full    Dental  (+) Teeth Intact, Dental Advisory Given   Pulmonary shortness of breath, pneumonia,    Pulmonary exam normal breath sounds clear to auscultation       Cardiovascular hypertension, + CAD and +CHF  Normal cardiovascular exam+ dysrhythmias + pacemaker  Rhythm:Regular Rate:Normal     Neuro/Psych PSYCHIATRIC DISORDERS Anxiety Depression TIACVA    GI/Hepatic Neg liver ROS, GERD  ,  Endo/Other  negative endocrine ROS  Renal/GU negative Renal ROS     Musculoskeletal  (+) Arthritis ,   Abdominal   Peds  Hematology negative hematology ROS (+)   Anesthesia Other Findings   Reproductive/Obstetrics                           Anesthesia Physical Anesthesia Plan  ASA: III  Anesthesia Plan: General   Post-op Pain Management:    Induction: Intravenous  PONV Risk Score and Plan: 4 or greater and Ondansetron, Dexamethasone and Treatment may vary due to age or medical condition  Airway Management Planned: LMA  Additional Equipment: None  Intra-op Plan:   Post-operative Plan: Extubation in OR  Informed Consent: I have reviewed the patients History and Physical, chart, labs and discussed the procedure including the risks, benefits and alternatives for the proposed anesthesia with the patient or authorized representative who has indicated his/her understanding and acceptance.     Dental advisory given  Plan Discussed with: CRNA  Anesthesia Plan Comments: (PAT note written 12/07/2019 by Myra Gianotti, PA-C. Lovenox bridge (for chronic afib). Has Medtronic PPM (left chest).   )      Anesthesia Quick Evaluation

## 2019-12-07 NOTE — Progress Notes (Signed)
Anesthesia Chart Review:  Case: [id number] Date/Time: 12/13/19 1430   Procedure: RIGHT BREAST LUMPECTOMY WITH RADIOACTIVE SEED LOCALIZATION (Right Breast) - LMA VS MAC   Anesthesia type: Choice   Pre-op diagnosis: RIGHT INVASIVE DUCTAL CARCINOMA   Location: [facility] OR ROOM 02 / [place] OR   Surgeons: Donnie Mesa, MD      DISCUSSION: Patient is a 86 year old female scheduled for the above procedure.   History includes never smoker, right breast cancer (11/2019), CAD (non-obstructive 2007), afib/flutter (diagnosed 08/2005, history of DDCV 2007, 20011; persistent afib), SSS (s/p Medtronic PPM 03/22/06; generator change 08/28/09), CHF, dyspnea, HTN, CVA (04/2017), skin cancer (s/p Mohs procedure 06/2019, right neck), anxiety, peripheral neuropathy.   Preoperative cardiology input outlined on 12/03/19 by Laurann Montana, NP: "Given past medical history and time since last visit, based on ACC/AHA guidelines, Misty Blackwell would be at acceptable risk for the planned procedure without further cardiovascular testing.   Reviewed by PharmD team. Per office protocol, patient can holdwarfarinfor 5days prior to procedure. Patientwill need bridging with Lovenox (enoxaparin) around procedure. She has an appointment for 12/04/19 to arrange Lovenox." (Notes indicate that clearance was also discussed with Dr. Sallyanne Kuster.) - Last warfarin scheduled for 12/07/19 with instructions per [facility] to start Lovenox bridge on 12/09/19. (Instructions outlined on 12/04/19 by Tommy Medal, [facility].)   By imaging, PPM is located on her left chest. Surgery is on the right breast.  Device Information: Clinic EP Physician:  Sanda Klein, MD Device Type:  Pacemaker Manufacturer and Phone #:  Medtronic: [id number] Pacemaker Dependent?:  No. Date of Last Device Check:  10/17/2019       Normal Device Function?:  Yes.    Electrophysiologist's Recommendations:  Have magnet available.  Provide continuous ECG monitoring when  magnet is used or reprogramming is to be performed.   Procedure may interfere with device function.  Magnet should be placed over device during procedure.  Preoperative COVID-19 test is scheduled for 12/10/2019. RSL scheduled for 12/12/2019. Anesthesia team to evaluate on the day of surgery. She is for repeat PT/INR on arrival.   VS: BP (!) 155/87   Pulse 88   Temp 37 C (Oral)   Resp 18   Ht 5' 4.5" (1.638 m)   Wt 64.7 kg   SpO2 98%   BMI 24.10 kg/m     PROVIDERS: [name], MD is PCP  Sanda Klein, MD is cardiologist. Last visit 11/19/19 with one year follow-up planned and 3 month PPM downloads. Known history of abnormal EKG (afib with prominent T wave inversion in the inferior and anterolateral leads).  [name], MD is HEM-ONC   LABS: Labs reviewed: Repeat PT/INR on day of surgery.  (all labs ordered are listed, but only abnormal results are displayed)  Labs Reviewed  PROTIME-INR - Abnormal; Notable for the following components:      Result Value   Prothrombin Time 22.8 (*)    INR 2.1 (*)    All other components within normal limits  BASIC METABOLIC PANEL  CBC    Normal spirometry on 05/31/18.   IMAGES: CXR 06/22/19: FINDINGS: There is cardiomegaly. Pacing device is in place. Atherosclerosis noted. Lungs clear. No pneumothorax or pleural fluid. No acute or focal bony abnormality. IMPRESSION: No acute disease. Cardiomegaly. Atherosclerosis  CTA head/neck 04/17/18: IMPRESSION: CTA NECK: 1. No hemodynamically significant stenosis ICA's. 2. Patent vertebral artery. 3. Severe RIGHT C4-5 neural foraminal narrowing. CTA HEAD: 1. No emergent large vessel occlusion or flow-limiting stenosis. Aortic Atherosclerosis (ICD10-I70.0).  EKG:  EKG 06/22/19: Atrial fibrillation at 92 bpm Abnormal T, consider ischemia, diffuse leads No significant change since last tracing Confirmed by Virgel Manifold [phone]) on 06/22/2019 2:19:20 PM  EKG 03/12/19: V-paced  rhythm  EKG 05/29/18: Atrial fibrillation Repol abnrm suggests ischemia, anterolateral   CV: Echo 04/18/18: Study Conclusions  - Procedure narrative: Transthoracic echocardiography. Image  quality was adequate. The study was technically difficult.  - Left ventricle: The cavity size was normal. Systolic function was  vigorous. The estimated ejection fraction was in the range of 65%  to 70%. Wall motion was normal; there were no regional wall  motion abnormalities. The study was not technically sufficient to  allow evaluation of LV diastolic dysfunction due to atrial  fibrillation.  - Aortic valve: There was mild regurgitation.  - Mitral valve: There was trivial regurgitation.  - Right ventricle: The cavity size was normal. Wall thickness was  normal. Systolic function was normal.  - Right atrium: Pacer wire or catheter noted in right atrium.  - Tricuspid valve: There was mild regurgitation.  - Pulmonary arteries: Systolic pressure was moderately increased.  PA peak pressure: 48 mm Hg (S).  - Inferior vena cava: The vessel was normal in size.  - Pericardium, extracardiac: There was no pericardial effusion.   Nuclear stress test 08/05/17:  Normal perfusion  Nuclear stress EF: 70%.  The study is normal.   Cardiac cath 08/06/05:  LV: EF 60%, mild LVH. MVP evident with trace MR. LM: Long and normal. LAD: Coursed to the apex where it bifurcated. Gave off 3 small DIAG from the proximal and midportion that were normal. LAD appeared moderate size and normal throughout its course except for a focal area of systolic kinking and compression to approximately 60-70% in the junction of the mid and distal third of the LAD between D3 and D4. Normal moderate D2. (IVUS done. Systolic compression with myocardial bridge junction of the mid and distal thrid of LAD, no significant atherosclerosis.) LCX: Nondominant. Comprised of marginal and small AV groove branch that were  normal. RCA: Dominant. 20-30% proximal RCA. 30-40% before the acute marginal. Multiple bifurcating PLA and bifurcating large PDA.  - Recommend medical therapy.   Past Medical History:  Diagnosis Date  . Anxiety   . Atrial fib/flutter, transient   . Atrial fibrillation, chronic ([place])   . CHF (congestive heart failure) ([place]) 10/29/2009   Echo - EF >55%; normal LV size and systolic function; unable to assess diastolic fcn due to E/A fusion, pulmonary vein flow pattern suggests elevated filling pressure; marked biatrail dilation, mild/mod tricuspid regurgitation; mod pulmonary htn; mild/mod mitral regurgitation; although echocardiographic features are incomplete findings suggest possible infiltrative cardiomyopathy (maybe amyloidosi  . Coronary artery disease 03/19/2002   R/P Cardiolite - EF 76%; nromal static and dynamic myocardial perfusion images; normal wall motion and endocardial thickening in all vascular territories  . Dyspnea   . Dysrhythmia   . Facial numbness 12/26/2008   carotid doppler - R and L ICAs 0-49% diameter reduction (velocities suggest low end of scale)  . Hypertension   . Pacemaker   . Peripheral neuropathy   . Pneumonia 2017  . Skin cancer    s/p surgical removal.  . Stroke ([place]) 04/2017  . TIA (transient ischemic attack)     Past Surgical History:  Procedure Laterality Date  . ABDOMINAL HYSTERECTOMY    . APPENDECTOMY    . BREAST SURGERY Left 1949  . CARDIAC CATHETERIZATION  08/06/2005   minimal coronary disease predominant RCA;  no significant atherosclerosis; new onset sick sinus syndrome and atrial flutter w/ ventricular response, controlled on med therapy; systemic HTN, normal renal arteries  . CARDIOVERSION  11/19/2009   successful DCCV from AF to sinus type rhythm  . CHOLECYSTECTOMY    . EYE SURGERY Bilateral 2013  . Skin cancer resection    . TONSILLECTOMY  1938    MEDICATIONS: . acidophilus (RISAQUAD) CAPS capsule  . anastrozole (ARIMIDEX) 1 MG tablet   . Ascorbic Acid (VITAMIN C) 1000 MG tablet  . Cholecalciferol (VITAMIN D) 125 MCG (5000 UT) CAPS  . Cyanocobalamin (VITAMIN B 12 PO)  . diltiazem (CARDIZEM CD) 180 MG 24 hr capsule  . enoxaparin (LOVENOX) 100 MG/ML injection  . ferrous fumarate (FERRO-SEQUELS) 18 MG CR tablet  . furosemide (LASIX) 40 MG tablet  . Magnesium 250 MG TABS  . nitroGLYCERIN (NITROSTAT) 0.4 MG SL tablet  . potassium chloride SA (KLOR-CON) 20 MEQ tablet  . telmisartan (MICARDIS) 80 MG tablet  . traMADol (ULTRAM) 50 MG tablet  . warfarin (COUMADIN) 3 MG tablet  . zinc gluconate 50 MG tablet   No current facility-administered medications for this encounter.    Myra Gianotti, PA-C Surgical Short Stay/Anesthesiology [facility] Phone [phone] [facility] Phone [phone] 12/07/2019 11:33 AM

## 2019-12-10 ENCOUNTER — Other Ambulatory Visit (HOSPITAL_COMMUNITY)
Admission: RE | Admit: 2019-12-10 | Discharge: 2019-12-10 | Disposition: A | Discharge status: Home / Self Care | Payer: Medicare Other | Source: Ambulatory Visit | Attending: Surgery | Admitting: Surgery

## 2019-12-10 DIAGNOSIS — Z20822 Contact with and (suspected) exposure to covid-19: Secondary | ICD-10-CM | POA: Diagnosis not present

## 2019-12-10 DIAGNOSIS — Z01812 Encounter for preprocedural laboratory examination: Secondary | ICD-10-CM | POA: Diagnosis present

## 2019-12-10 LAB — SARS CORONAVIRUS 2 (TAT 6-24 HRS): SARS Coronavirus 2: NEGATIVE

## 2019-12-12 ENCOUNTER — Ambulatory Visit
Admission: RE | Admit: 2019-12-12 | Discharge: 2019-12-12 | Disposition: A | Discharge status: Home / Self Care | Payer: Medicare Other | Source: Ambulatory Visit | Attending: Surgery | Admitting: Surgery

## 2019-12-12 ENCOUNTER — Other Ambulatory Visit: Payer: Self-pay

## 2019-12-12 DIAGNOSIS — C50911 Malignant neoplasm of unspecified site of right female breast: Secondary | ICD-10-CM

## 2019-12-13 ENCOUNTER — Encounter (HOSPITAL_COMMUNITY)
Admission: RE | Disposition: A | Discharge status: Home / Self Care | Payer: Self-pay | Source: Home / Self Care | Attending: Surgery

## 2019-12-13 ENCOUNTER — Ambulatory Visit (HOSPITAL_COMMUNITY): Payer: Medicare Other | Admitting: Vascular Surgery

## 2019-12-13 ENCOUNTER — Other Ambulatory Visit: Payer: Self-pay

## 2019-12-13 ENCOUNTER — Encounter (HOSPITAL_COMMUNITY): Payer: Self-pay | Admitting: Surgery

## 2019-12-13 ENCOUNTER — Ambulatory Visit
Admission: RE | Admit: 2019-12-13 | Discharge: 2019-12-13 | Disposition: A | Discharge status: Home / Self Care | Payer: Medicare Other | Source: Ambulatory Visit | Attending: Surgery | Admitting: Surgery

## 2019-12-13 ENCOUNTER — Observation Stay (HOSPITAL_COMMUNITY)
Admission: RE | Admit: 2019-12-13 | Discharge: 2019-12-14 | Disposition: A | Discharge status: Home / Self Care | Payer: Medicare Other | Attending: Surgery | Admitting: Surgery

## 2019-12-13 DIAGNOSIS — Z7901 Long term (current) use of anticoagulants: Secondary | ICD-10-CM | POA: Insufficient documentation

## 2019-12-13 DIAGNOSIS — Z91040 Latex allergy status: Secondary | ICD-10-CM | POA: Insufficient documentation

## 2019-12-13 DIAGNOSIS — D0511 Intraductal carcinoma in situ of right breast: Principal | ICD-10-CM | POA: Insufficient documentation

## 2019-12-13 DIAGNOSIS — C50911 Malignant neoplasm of unspecified site of right female breast: Secondary | ICD-10-CM

## 2019-12-13 HISTORY — PX: BREAST LUMPECTOMY: SHX2

## 2019-12-13 HISTORY — PX: BREAST LUMPECTOMY WITH RADIOACTIVE SEED LOCALIZATION: SHX6424

## 2019-12-13 LAB — PROTIME-INR
INR: 1.1 (ref 0.8–1.2)
Prothrombin Time: 13.5 seconds (ref 11.4–15.2)

## 2019-12-13 SURGERY — BREAST LUMPECTOMY WITH RADIOACTIVE SEED LOCALIZATION
Anesthesia: General | Site: Breast | Laterality: Right

## 2019-12-13 MED ORDER — TRAMADOL HCL 50 MG PO TABS
50.0000 mg | ORAL_TABLET | Freq: Four times a day (QID) | ORAL | Status: DC | PRN
  Administered 2019-12-13: 50 mg via ORAL
  Filled 2019-12-13: qty 1

## 2019-12-13 MED ORDER — MORPHINE SULFATE (PF) 2 MG/ML IV SOLN: 1.0000 mg | INTRAVENOUS | Status: DC | PRN

## 2019-12-13 MED ORDER — CHLORHEXIDINE GLUCONATE CLOTH 2 % EX PADS: 6.0000 | MEDICATED_PAD | Freq: Once | CUTANEOUS | Status: DC

## 2019-12-13 MED ORDER — 0.9 % SODIUM CHLORIDE (POUR BTL) OPTIME
TOPICAL | Status: DC | PRN
  Administered 2019-12-13: 1000 mL

## 2019-12-13 MED ORDER — BUPIVACAINE HCL (PF) 0.25 % IJ SOLN
INTRAMUSCULAR | Status: AC
  Filled 2019-12-13: qty 30

## 2019-12-13 MED ORDER — ONDANSETRON HCL 4 MG/2ML IJ SOLN: 4.0000 mg | Freq: Four times a day (QID) | INTRAMUSCULAR | Status: DC | PRN

## 2019-12-13 MED ORDER — ONDANSETRON HCL 4 MG/2ML IJ SOLN
INTRAMUSCULAR | Status: DC | PRN
  Administered 2019-12-13: 4 mg via INTRAVENOUS

## 2019-12-13 MED ORDER — PROPOFOL 10 MG/ML IV BOLUS
INTRAVENOUS | Status: AC
  Filled 2019-12-13: qty 20

## 2019-12-13 MED ORDER — HYDROCODONE-ACETAMINOPHEN 5-325 MG PO TABS: 1.0000 | ORAL_TABLET | ORAL | Status: DC | PRN

## 2019-12-13 MED ORDER — ONDANSETRON 4 MG PO TBDP: 4.0000 mg | ORAL_TABLET | Freq: Four times a day (QID) | ORAL | Status: DC | PRN

## 2019-12-13 MED ORDER — ANASTROZOLE 1 MG PO TABS
1.0000 mg | ORAL_TABLET | Freq: Every day | ORAL | Status: DC
  Administered 2019-12-14: 1 mg via ORAL
  Filled 2019-12-13: qty 1

## 2019-12-13 MED ORDER — ORAL CARE MOUTH RINSE: 15.0000 mL | Freq: Once | OROMUCOSAL | Status: AC

## 2019-12-13 MED ORDER — DIPHENHYDRAMINE HCL 12.5 MG/5ML PO ELIX: 12.5000 mg | ORAL_SOLUTION | Freq: Four times a day (QID) | ORAL | Status: DC | PRN

## 2019-12-13 MED ORDER — ONDANSETRON HCL 4 MG/2ML IJ SOLN: 4.0000 mg | Freq: Once | INTRAMUSCULAR | Status: DC | PRN

## 2019-12-13 MED ORDER — FENTANYL CITRATE (PF) 100 MCG/2ML IJ SOLN
25.0000 ug | INTRAMUSCULAR | Status: DC | PRN
  Administered 2019-12-13 (×2): 25 ug via INTRAVENOUS

## 2019-12-13 MED ORDER — DEXAMETHASONE SODIUM PHOSPHATE 10 MG/ML IJ SOLN
INTRAMUSCULAR | Status: DC | PRN
  Administered 2019-12-13: 4 mg via INTRAVENOUS

## 2019-12-13 MED ORDER — PROPOFOL 10 MG/ML IV BOLUS
INTRAVENOUS | Status: DC | PRN
  Administered 2019-12-13: 110 mg via INTRAVENOUS

## 2019-12-13 MED ORDER — POTASSIUM CHLORIDE CRYS ER 20 MEQ PO TBCR
20.0000 meq | EXTENDED_RELEASE_TABLET | Freq: Every day | ORAL | Status: DC
  Administered 2019-12-14: 20 meq via ORAL
  Filled 2019-12-13: qty 1

## 2019-12-13 MED ORDER — DEXAMETHASONE SODIUM PHOSPHATE 10 MG/ML IJ SOLN
INTRAMUSCULAR | Status: AC
  Filled 2019-12-13: qty 1

## 2019-12-13 MED ORDER — FENTANYL CITRATE (PF) 250 MCG/5ML IJ SOLN
INTRAMUSCULAR | Status: DC | PRN
  Administered 2019-12-13: 50 ug via INTRAVENOUS

## 2019-12-13 MED ORDER — DIPHENHYDRAMINE HCL 50 MG/ML IJ SOLN: 12.5000 mg | Freq: Four times a day (QID) | INTRAMUSCULAR | Status: DC | PRN

## 2019-12-13 MED ORDER — ONDANSETRON HCL 4 MG/2ML IJ SOLN
INTRAMUSCULAR | Status: AC
  Filled 2019-12-13: qty 2

## 2019-12-13 MED ORDER — IRBESARTAN 300 MG PO TABS
300.0000 mg | ORAL_TABLET | Freq: Every day | ORAL | Status: DC
  Administered 2019-12-13 – 2019-12-14 (×2): 300 mg via ORAL
  Filled 2019-12-13 (×2): qty 1

## 2019-12-13 MED ORDER — LACTATED RINGERS IV SOLN: INTRAVENOUS | Status: DC

## 2019-12-13 MED ORDER — CHLORHEXIDINE GLUCONATE 0.12 % MT SOLN
15.0000 mL | Freq: Once | OROMUCOSAL | Status: AC
  Administered 2019-12-13: 15 mL via OROMUCOSAL
  Filled 2019-12-13: qty 15

## 2019-12-13 MED ORDER — CEFAZOLIN SODIUM-DEXTROSE 2-4 GM/100ML-% IV SOLN
2.0000 g | INTRAVENOUS | Status: AC
  Administered 2019-12-13: 2 g via INTRAVENOUS

## 2019-12-13 MED ORDER — CEFAZOLIN SODIUM-DEXTROSE 2-4 GM/100ML-% IV SOLN
INTRAVENOUS | Status: AC
  Filled 2019-12-13: qty 100

## 2019-12-13 MED ORDER — ACETAMINOPHEN 650 MG RE SUPP: 650.0000 mg | Freq: Four times a day (QID) | RECTAL | Status: DC | PRN

## 2019-12-13 MED ORDER — ACETAMINOPHEN 325 MG PO TABS: 650.0000 mg | ORAL_TABLET | Freq: Four times a day (QID) | ORAL | Status: DC | PRN

## 2019-12-13 MED ORDER — FENTANYL CITRATE (PF) 100 MCG/2ML IJ SOLN
INTRAMUSCULAR | Status: AC
  Filled 2019-12-13: qty 2

## 2019-12-13 MED ORDER — DILTIAZEM HCL ER COATED BEADS 180 MG PO CP24
180.0000 mg | ORAL_CAPSULE | Freq: Every day | ORAL | Status: DC
  Administered 2019-12-14: 180 mg via ORAL
  Filled 2019-12-13: qty 1

## 2019-12-13 MED ORDER — HEMOSTATIC AGENTS (NO CHARGE) OPTIME
TOPICAL | Status: DC | PRN
  Administered 2019-12-13: 1 via TOPICAL

## 2019-12-13 MED ORDER — ENOXAPARIN SODIUM 100 MG/ML ~~LOC~~ SOLN
100.0000 mg | SUBCUTANEOUS | Status: DC
  Filled 2019-12-13: qty 1

## 2019-12-13 MED ORDER — FENTANYL CITRATE (PF) 250 MCG/5ML IJ SOLN
INTRAMUSCULAR | Status: AC
  Filled 2019-12-13: qty 5

## 2019-12-13 MED ORDER — LIDOCAINE 2% (20 MG/ML) 5 ML SYRINGE
INTRAMUSCULAR | Status: DC | PRN
  Administered 2019-12-13: 100 mg via INTRAVENOUS

## 2019-12-13 MED ORDER — BUPIVACAINE HCL (PF) 0.25 % IJ SOLN
INTRAMUSCULAR | Status: DC | PRN
  Administered 2019-12-13: 10 mL

## 2019-12-13 SURGICAL SUPPLY — 47 items
APL PRP STRL LF DISP 70% ISPRP (MISCELLANEOUS) ×1
APL SKNCLS STERI-STRIP NONHPOA (GAUZE/BANDAGES/DRESSINGS) ×1
APPLIER CLIP 9.375 MED OPEN (MISCELLANEOUS) ×2
APR CLP MED 9.3 20 MLT OPN (MISCELLANEOUS) ×1
BENZOIN TINCTURE PRP APPL 2/3 (GAUZE/BANDAGES/DRESSINGS) ×2 IMPLANT
BINDER BREAST LRG (GAUZE/BANDAGES/DRESSINGS) IMPLANT
BINDER BREAST XLRG (GAUZE/BANDAGES/DRESSINGS) ×2 IMPLANT
CANISTER SUCT 3000ML PPV (MISCELLANEOUS) ×2 IMPLANT
CHLORAPREP W/TINT 26 (MISCELLANEOUS) ×2 IMPLANT
CLIP APPLIE 9.375 MED OPEN (MISCELLANEOUS) IMPLANT
CLOSURE STERI-STRIP 1/4X4 (GAUZE/BANDAGES/DRESSINGS) ×2 IMPLANT
COVER PROBE W GEL 5X96 (DRAPES) ×2 IMPLANT
COVER SURGICAL LIGHT HANDLE (MISCELLANEOUS) ×2 IMPLANT
COVER WAND RF STERILE (DRAPES) ×2 IMPLANT
DEVICE DUBIN SPECIMEN MAMMOGRA (MISCELLANEOUS) ×2 IMPLANT
DRAPE CHEST BREAST 15X10 FENES (DRAPES) ×2 IMPLANT
DRSG TEGADERM 4X10 (GAUZE/BANDAGES/DRESSINGS) ×2 IMPLANT
DRSG TEGADERM 4X4.75 (GAUZE/BANDAGES/DRESSINGS) IMPLANT
ELECT CAUTERY BLADE 6.4 (BLADE) ×2 IMPLANT
ELECT REM PT RETURN 9FT ADLT (ELECTROSURGICAL) ×2
ELECTRODE REM PT RTRN 9FT ADLT (ELECTROSURGICAL) ×1 IMPLANT
GAUZE SPONGE 2X2 8PLY STRL LF (GAUZE/BANDAGES/DRESSINGS) ×1 IMPLANT
GAUZE SPONGE 4X4 12PLY STRL (GAUZE/BANDAGES/DRESSINGS) ×1 IMPLANT
GLOVE BIO SURGEON STRL SZ7 (GLOVE) ×2 IMPLANT
GLOVE BIOGEL PI IND STRL 7.5 (GLOVE) ×1 IMPLANT
GLOVE BIOGEL PI INDICATOR 7.5 (GLOVE) ×1
GOWN STRL REUS W/ TWL LRG LVL3 (GOWN DISPOSABLE) ×2 IMPLANT
GOWN STRL REUS W/TWL LRG LVL3 (GOWN DISPOSABLE) ×4
ILLUMINATOR WAVEGUIDE N/F (MISCELLANEOUS) IMPLANT
KIT BASIN OR (CUSTOM PROCEDURE TRAY) ×2 IMPLANT
KIT MARKER MARGIN INK (KITS) ×2 IMPLANT
LIGHT WAVEGUIDE WIDE FLAT (MISCELLANEOUS) IMPLANT
NDL HYPO 25GX1X1/2 BEV (NEEDLE) ×1 IMPLANT
NEEDLE HYPO 25GX1X1/2 BEV (NEEDLE) ×2 IMPLANT
NS IRRIG 1000ML POUR BTL (IV SOLUTION) ×2 IMPLANT
PACK GENERAL/GYN (CUSTOM PROCEDURE TRAY) ×2 IMPLANT
POWDER SURGICEL 3.0 GRAM (HEMOSTASIS) ×1 IMPLANT
SPONGE GAUZE 2X2 STER 10/PKG (GAUZE/BANDAGES/DRESSINGS) ×1
SPONGE LAP 4X18 RFD (DISPOSABLE) ×2 IMPLANT
STRIP CLOSURE SKIN 1/2X4 (GAUZE/BANDAGES/DRESSINGS) ×1 IMPLANT
SUT MNCRL AB 4-0 PS2 18 (SUTURE) ×2 IMPLANT
SUT SILK 2 0 SH (SUTURE) ×2 IMPLANT
SUT VIC AB 3-0 SH 27 (SUTURE) ×2
SUT VIC AB 3-0 SH 27X BRD (SUTURE) ×1 IMPLANT
SYR CONTROL 10ML LL (SYRINGE) ×2 IMPLANT
TOWEL GREEN STERILE (TOWEL DISPOSABLE) ×2 IMPLANT
TOWEL GREEN STERILE FF (TOWEL DISPOSABLE) ×2 IMPLANT

## 2019-12-13 NOTE — Transfer of Care (Signed)
Immediate Anesthesia Transfer of Care Note  Patient: Misty Blackwell  Procedure(s) Performed: RIGHT BREAST LUMPECTOMY WITH RADIOACTIVE SEED LOCALIZATION (Right Breast)  Patient Location: PACU  Anesthesia Type:General  Level of Consciousness: drowsy  Airway & Oxygen Therapy: Patient Spontanous Breathing and Patient connected to nasal cannula oxygen  Post-op Assessment: Report given to RN and Post -op Vital signs reviewed and stable  Post vital signs: Reviewed and stable  Last Vitals:  Vitals Value Taken Time  BP 180/83 12/13/19 1510  Temp 36.2 C 12/13/19 1510  Pulse 71 12/13/19 1512  Resp 18 12/13/19 1512  SpO2 100 % 12/13/19 1512  Vitals shown include unvalidated device data.  Last Pain:  Vitals:   12/13/19 1241  TempSrc:   PainSc: 0-No pain         Complications: No complications documented.

## 2019-12-13 NOTE — Anesthesia Procedure Notes (Signed)
Procedure Name: LMA Insertion Date/Time: 12/13/2019 2:23 PM Performed by: Colin Benton, CRNA Pre-anesthesia Checklist: Patient identified, Emergency Drugs available, Suction available and Patient being monitored Patient Re-evaluated:Patient Re-evaluated prior to induction Oxygen Delivery Method: Circle system utilized Preoxygenation: Pre-oxygenation with 100% oxygen Induction Type: IV induction Ventilation: Mask ventilation without difficulty LMA: LMA inserted LMA Size: 4.0 Number of attempts: 1 Placement Confirmation: positive ETCO2 and breath sounds checked- equal and bilateral Tube secured with: Tape Dental Injury: Teeth and Oropharynx as per pre-operative assessment

## 2019-12-13 NOTE — Op Note (Signed)
Pre-op Diagnosis:  Invasive ductal carcinoma - right breast Post-op Diagnosis: same Procedure:  Right radioactive seed localized lumpectomy Surgeon:  TSUEI,MATTHEW K. Anesthesia:  GEN - LMA Indications:  This is a 86 year old female with cardiac comorbidities who presented recently with a palpable right breast mass. She was found to have a 1.5 cm mass at 12:00 in the right breast. Left breast and axillae were negative. Biopsy showed invasive ductal carcinoma - ER/PR +, her 2 negative.  She is chronically anticoagulated on warfarin. The patient is not a candidate for chemotherapy but she was referred to us for consideration of a lumpectomy without sentinel lymph node. I discussed her case with her cardiologist who feels that she is an acceptable risk for surgery but needs to work on a Lovenox bridge.   Description of procedure: The patient is brought to the operating room placed in supine position on the operating room table. After an adequate level of general anesthesia was obtained, her right breast was prepped with ChloraPrep and draped in sterile fashion. A timeout was taken to ensure the proper patient and proper procedure. We interrogated the breast with the neoprobe. We made a transverse incision over the mass after infiltrating with 0.25% Marcaine. Dissection was carried down in the breast tissue with cautery. We used the neoprobe to guide us towards the radioactive seed. We excised an area of tissue around the radioactive seed 3 cm in diameter. The specimen was removed and was oriented with a paint kit. Specimen mammogram showed the radioactive seed as well as the biopsy clip within the specimen. This was sent for pathologic examination. There is no residual radioactivity within the biopsy cavity. We inspected carefully for hemostasis. Clips were placed to mark the marginsThe wound was thoroughly irrigated. We applied Surgicel powder to the wound, since the patient is on chronic anticoagulation.   The wound was closed with a deep layer of 3-0 Vicryl and a subcuticular layer of 4-0 Monocryl. Benzoin Steri-Strips were applied. The patient was then extubated and brought to the recovery room in stable condition. All sponge, instrument, and needle counts are correct.  Matthew K. Tsuei, MD, FACS [facility]  General/ Trauma Surgery  12/13/2019 3:09 PM    

## 2019-12-13 NOTE — Anesthesia Postprocedure Evaluation (Signed)
Anesthesia Post Note  Patient: Misty Blackwell  Procedure(s) Performed: RIGHT BREAST LUMPECTOMY WITH RADIOACTIVE SEED LOCALIZATION (Right Breast)     Patient location during evaluation: PACU Anesthesia Type: General Level of consciousness: sedated and patient cooperative Pain management: pain level controlled Vital Signs Assessment: post-procedure vital signs reviewed and stable Respiratory status: spontaneous breathing Cardiovascular status: stable Anesthetic complications: no   No complications documented.  Last Vitals:  Vitals:   12/13/19 1753 12/13/19 1947  BP: (!) 174/70 (!) 158/57  Pulse: 68 71  Resp: 17 18  Temp: 36.4 C (!) 36.4 C  SpO2: 95% 90%    Last Pain:  Vitals:   12/13/19 2215  TempSrc:   PainSc: [place]

## 2019-12-13 NOTE — Interval H&P Note (Signed)
History and Physical Interval Note:  12/13/2019 12:33 PM  Misty Blackwell  has presented today for surgery, with the diagnosis of RIGHT INVASIVE DUCTAL CARCINOMA.  The various methods of treatment have been discussed with the patient and family. After consideration of risks, benefits and other options for treatment, the patient has consented to  Procedure(s) with comments: RIGHT BREAST LUMPECTOMY WITH RADIOACTIVE SEED LOCALIZATION (Right) - LMA VS MAC as a surgical intervention.  The patient's history has been reviewed, patient examined, no change in status, stable for surgery.  I have reviewed the patient's chart and labs.  Questions were answered to the patient's satisfaction.     Maia Petties

## 2019-12-14 ENCOUNTER — Encounter (HOSPITAL_COMMUNITY): Payer: Self-pay | Admitting: Surgery

## 2019-12-14 DIAGNOSIS — D0511 Intraductal carcinoma in situ of right breast: Secondary | ICD-10-CM | POA: Diagnosis not present

## 2019-12-14 LAB — CBC
HCT: 40.1 % (ref 36.0–46.0)
Hemoglobin: 12.7 g/dL (ref 12.0–15.0)
MCH: 30 pg (ref 26.0–34.0)
MCHC: 31.7 g/dL (ref 30.0–36.0)
MCV: 94.6 fL (ref 80.0–100.0)
Platelets: 178 10*3/uL (ref 150–400)
RBC: 4.24 MIL/uL (ref 3.87–5.11)
RDW: 12.3 % (ref 11.5–15.5)
WBC: 5.1 10*3/uL (ref 4.0–10.5)
nRBC: 0 % (ref 0.0–0.2)

## 2019-12-14 LAB — BASIC METABOLIC PANEL
Anion gap: 10 (ref 5–15)
BUN: 16 mg/dL (ref 8–23)
CO2: 27 mmol/L (ref 22–32)
Calcium: 9.5 mg/dL (ref 8.9–10.3)
Chloride: 103 mmol/L (ref 98–111)
Creatinine, Ser: 0.8 mg/dL (ref 0.44–1.00)
GFR calc Af Amer: >60 mL/min (ref >60)
GFR calc non Af Amer: >60 mL/min (ref >60)
Glucose, Bld: 157 mg/dL — ABNORMAL HIGH (ref 70–99)
Potassium: 5.1 mmol/L (ref 3.5–5.1)
Sodium: 140 mmol/L (ref 135–145)

## 2019-12-14 MED ORDER — ENOXAPARIN SODIUM 100 MG/ML ~~LOC~~ SOLN
100.0000 mg | SUBCUTANEOUS | Status: DC
  Administered 2019-12-14: 100 mg via SUBCUTANEOUS
  Filled 2019-12-14: qty 1

## 2019-12-14 MED ORDER — TRAMADOL HCL 50 MG PO TABS: 50.0000 mg | ORAL_TABLET | Freq: Four times a day (QID) | ORAL | 0 refills | Status: DC | PRN

## 2019-12-14 NOTE — Discharge Instructions (Signed)
[facility] °Office Phone Number [phone] ° °BREAST BIOPSY/ PARTIAL MASTECTOMY: POST OP INSTRUCTIONS ° °Always review your discharge instruction sheet given to you by the facility where your surgery was performed. ° °IF YOU HAVE DISABILITY OR FAMILY LEAVE FORMS, YOU MUST BRING THEM TO THE OFFICE FOR PROCESSING.  DO NOT GIVE THEM TO YOUR DOCTOR. ° °1. A prescription for pain medication may be given to you upon discharge.  Take your pain medication as prescribed, if needed.  If narcotic pain medicine is not needed, then you may take acetaminophen (Tylenol) or ibuprofen (Advil) as needed. °2. Take your usually prescribed medications unless otherwise directed °3. If you need a refill on your pain medication, please contact your pharmacy.  They will contact our office to request authorization.  Prescriptions will not be filled after 5pm or on week-ends. °4. You should eat very light the first 24 hours after surgery, such as soup, crackers, pudding, etc.  Resume your normal diet the day after surgery. °5. Most patients will experience some swelling and bruising in the breast.  Ice packs and a good support bra will help.  Swelling and bruising can take several days to resolve.  °6. It is common to experience some constipation if taking pain medication after surgery.  Increasing fluid intake and taking a stool softener will usually help or prevent this problem from occurring.  A mild laxative (Milk of Magnesia or Miralax) should be taken according to package directions if there are no bowel movements after 48 hours. °7. Unless discharge instructions indicate otherwise, you may remove your bandages 24-48 hours after surgery, and you may shower at that time.  You may have steri-strips (small skin tapes) in place directly over the incision.  These strips should be left on the skin for 7-10 days.  If your surgeon used skin glue on the incision, you may shower in 24 hours.  The glue will flake off over the  next 2-3 weeks.  Any sutures or staples will be removed at the office during your follow-up visit. °8. ACTIVITIES:  You may resume regular daily activities (gradually increasing) beginning the next day.  Wearing a good support bra or sports bra minimizes pain and swelling.  You may have sexual intercourse when it is comfortable. °a. You may drive when you no longer are taking prescription pain medication, you can comfortably wear a seatbelt, and you can safely maneuver your car and apply brakes. °b. RETURN TO WORK:  ______________________________________________________________________________________ °9. You should see your doctor in the office for a follow-up appointment approximately two weeks after your surgery.  Your doctor’s nurse will typically make your follow-up appointment when she calls you with your pathology report.  Expect your pathology report 2-3 business days after your surgery.  You may call to check if you do not hear from us after three days. °10. OTHER INSTRUCTIONS: _______________________________________________________________________________________________ _____________________________________________________________________________________________________________________________________ °_____________________________________________________________________________________________________________________________________ °_____________________________________________________________________________________________________________________________________ ° °WHEN TO CALL YOUR DOCTOR: °1. Fever over 101.0 °2. Nausea and/or vomiting. °3. Extreme swelling or bruising. °4. Continued bleeding from incision. °5. Increased pain, redness, or drainage from the incision. ° °The clinic staff is available to answer your questions during regular business hours.  Please don’t hesitate to call and ask to speak to one of the nurses for clinical concerns.  If you have a medical emergency, go to the nearest  emergency room or call 911.  A surgeon from [facility] is always on call at the hospital. ° °For further questions, please visit [email or web address]  °

## 2019-12-14 NOTE — Discharge Summary (Signed)
Physician Discharge Summary  [name] MRN: [id number] DOB/AGE: 84-Mar-1929 86 y.o.  Admit date: 12/13/2019 Discharge date: 12/14/2019  Admission Diagnoses:  Invasive ductal carcinoma - right breast  Discharge Diagnoses: same Active Problems:   Invasive ductal carcinoma of breast, female, right [facility])   Discharged Condition: good  Hospital Course: Right radioactive seed localized lumpectomy 12/13/19.  She was observed overnight on telemetry due to her cardiac comorbidities.  She did well and is ready for discharge today  Lab Results  Component Value Date   WBC 5.1 12/14/2019   HGB 12.7 12/14/2019   HCT 40.1 12/14/2019   MCV 94.6 12/14/2019   PLT 178 12/14/2019   Lab Results  Component Value Date   CREATININE 0.80 12/14/2019   BUN 16 12/14/2019   NA 140 12/14/2019   K 5.1 12/14/2019   CL 103 12/14/2019   CO2 27 12/14/2019    Treatments: surgery: Right RSL 12/13/19  Discharge Exam: Blood pressure (!) 166/72, pulse 62, temperature 98.5 F (36.9 C), temperature source Oral, resp. rate 16, height 5' 4.5" (1.638 m), weight 65.3 kg, SpO2 96 %. General appearance: alert, cooperative and no distress Chest wall: minimal soreness around right lumpectomy incision; no sign of hematoma  Disposition: Discharge disposition: 01-Home or Self Care       Discharge Instructions    Call MD for:  persistant nausea and vomiting   Complete by: As directed    Call MD for:  redness, tenderness, or signs of infection (pain, swelling, redness, odor or green/yellow discharge around incision site)   Complete by: As directed    Call MD for:  severe uncontrolled pain   Complete by: As directed    Call MD for:  temperature >100.4   Complete by: As directed    Diet general   Complete by: As directed    Driving Restrictions   Complete by: As directed    Do not drive while taking pain medications   Increase activity slowly   Complete by: As directed    May shower /  Bathe   Complete by: As directed      Allergies as of 12/14/2019      Reactions   Latex Itching   Ace Inhibitors Other (See Comments)   Unknown reaction   Augmentin [amoxicillin-pot Clavulanate] Other (See Comments)   Ciprofloxacin Other (See Comments)   Levaquin [levofloxacin In D5w] Other (See Comments)   Zocor [simvastatin] Other (See Comments)   Acrylic Polymer [carbomer] Itching   Chocolate Other (See Comments)   migraine's    Gabapentin Other (See Comments)   Unsteady gait       Medication List    TAKE these medications   acidophilus Caps capsule Take 1 capsule by mouth daily.   anastrozole 1 MG tablet Commonly known as: ARIMIDEX Take 1 tablet (1 mg total) by mouth daily.   diltiazem 180 MG 24 hr capsule Commonly known as: CARDIZEM CD TAKE 1 CAPSULE BY MOUTH EVERY DAY What changed:   how much to take  how to take this  when to take this  additional instructions   enoxaparin 100 MG/ML injection Commonly known as: LOVENOX Inject 1 mL (100 mg total) into the skin daily. As directed by coumadin clinic   ferrous fumarate 18 MG CR tablet Commonly known as: FERRO-SEQUELS Take 18 mg by mouth daily.   furosemide 40 MG tablet Commonly known as: LASIX Take 1 tablet (40 mg total) by mouth daily. What changed:   when  to take this  reasons to take this   Magnesium 250 MG Tabs Take 250 mg by mouth daily.   nitroGLYCERIN 0.4 MG SL tablet Commonly known as: NITROSTAT Place 0.4 mg under the tongue every 5 (five) minutes as needed for chest pain.   potassium chloride SA 20 MEQ tablet Commonly known as: KLOR-CON Take 1 tablet daily for potassium replacement. What changed:   how much to take  how to take this  when to take this  additional instructions   telmisartan 80 MG tablet Commonly known as: MICARDIS Take 1 tablet (80 mg total) by mouth daily.   traMADol 50 MG tablet Commonly known as: Ultram Take 1 tablet every 4 hours as needed for  Pain What changed: Another medication with the same name was added. Make sure you understand how and when to take each.   traMADol 50 MG tablet Commonly known as: ULTRAM Take 1 tablet (50 mg total) by mouth every 6 (six) hours as needed for moderate pain or severe pain. What changed: You were already taking a medication with the same name, and this prescription was added. Make sure you understand how and when to take each.   VITAMIN B 12 PO Take 1,000 mcg by mouth daily.   vitamin C 1000 MG tablet Take 1,000 mg by mouth daily.   Vitamin D 125 MCG (5000 UT) Caps Take 5,000 Units by mouth daily.   warfarin 3 MG tablet Commonly known as: COUMADIN Take as directed. If you are unsure how to take this medication, talk to your nurse or doctor. Original instructions: TAKE 1 TO 1 AND 1/2 TABLET BY MOUTH AS DIRECTED What changed:   how much to take  how to take this  when to take this  additional instructions   zinc gluconate 50 MG tablet Take 50 mg by mouth daily.       Follow-up Information    Donnie Mesa, MD. Schedule an appointment as soon as possible for a visit in 3 week(s).   Specialty: General Surgery Contact information: [address] [place] [id number] [phone]               Signed: Maia Petties 12/14/2019, 8:37 AM

## 2019-12-14 NOTE — Progress Notes (Signed)
Discharge instructions given; pt in stable condition; Pt.'s niece in room and will be pt.'s ride home.

## 2019-12-14 NOTE — Plan of Care (Signed)

## 2019-12-17 LAB — SURGICAL PATHOLOGY

## 2019-12-19 ENCOUNTER — Encounter: Payer: Self-pay | Admitting: *Deleted

## 2019-12-19 ENCOUNTER — Other Ambulatory Visit: Payer: Self-pay

## 2019-12-19 ENCOUNTER — Ambulatory Visit (INDEPENDENT_AMBULATORY_CARE_PROVIDER_SITE_OTHER): Payer: Medicare Other

## 2019-12-19 DIAGNOSIS — I482 Chronic atrial fibrillation, unspecified: Secondary | ICD-10-CM

## 2019-12-19 DIAGNOSIS — Z7901 Long term (current) use of anticoagulants: Secondary | ICD-10-CM | POA: Diagnosis not present

## 2019-12-19 LAB — POCT INR: INR: 1.3 — AB (ref 2.0–3.0)

## 2019-12-19 NOTE — Patient Instructions (Signed)
Take 3 tablets today and 2 tablets tomorrow.  Continue Lovenox injections.  Repeat INR on Friday. Call if you have questions [phone] (Kristin/Raquel)

## 2019-12-19 NOTE — Progress Notes (Signed)
Patient Care Team: [name], MD as PCP - General (Internal Medicine) [name], [name], MD as PCP - Cardiology (Cardiology) [name], MD as Consulting Physician (Urology) [name], [name], MD as Consulting Physician (Neurology) [name], MD as Consulting Physician (Neurology) [name], [name], MD as Consulting Physician (Cardiology) [name], MD (Inactive) as Consulting Physician (Gastroenterology) [name], RN as Oncology Nurse Navigator [name], RN as Oncology Nurse Navigator  DIAGNOSIS:    ICD-10-CM   1. Malignant neoplasm of upper-outer quadrant of right breast in female, estrogen receptor positive ([place])  C50.411    Z17.0     SUMMARY OF ONCOLOGIC HISTORY: Oncology History  Malignant neoplasm of upper-outer quadrant of right breast in female, estrogen receptor positive ([place])  11/15/2019 Cancer Staging   Staging form: Breast, AJCC 8th Edition - Clinical stage from 11/15/2019: Stage IA (cT1c, cN0, cM0, G2, ER+, PR+, HER2-) - Signed by [name], NP on 11/21/2019   11/15/2019 Initial Biopsy   Palpable right breast mass: 1.5 cm at 12 o'clock position, benign intramammary lymph node, axilla negative, right breast biopsy 1 grade 2 ER 80%, PR 50%, Ki-67 5%, HER-2 negative   12/13/2019 Surgery   Right lumpectomy ([facility]): invasive lobular carcinoma, grade 2, 2.4cm, clear margins.      CHIEF COMPLIANT: Follow-up of right breast cancer s/p lumpectomy   INTERVAL HISTORY: Misty Blackwell is a 86 y.o. with above-mentioned history of right breast cancer. She underwent a right lumpectomy on 12/13/19 with Dr. Georgette Dover for which pathology showed invasive lobular carcinoma, grade 2, 2.4cm, clear margins. She presents to the clinic today to review the pathology report and discuss further treatment.   ALLERGIES:  is allergic to latex, ace inhibitors, augmentin [amoxicillin-pot clavulanate], ciprofloxacin, levaquin [levofloxacin in d5w],  zocor [simvastatin], acrylic polymer [carbomer], chocolate, and gabapentin.  MEDICATIONS:  Current Outpatient Medications  Medication Sig Dispense Refill  . acidophilus (RISAQUAD) CAPS capsule Take 1 capsule by mouth daily.    Marland Kitchen anastrozole (ARIMIDEX) 1 MG tablet Take 1 tablet (1 mg total) by mouth daily. 90 tablet 3  . Ascorbic Acid (VITAMIN C) 1000 MG tablet Take 1,000 mg by mouth daily.     . Cholecalciferol (VITAMIN D) 125 MCG (5000 UT) CAPS Take 5,000 Units by mouth daily.    . Cyanocobalamin (VITAMIN B 12 PO) Take 1,000 mcg by mouth daily.     Marland Kitchen diltiazem (CARDIZEM CD) 180 MG 24 hr capsule TAKE 1 CAPSULE BY MOUTH EVERY DAY (Patient taking differently: Take 180 mg by mouth daily. ) 90 capsule 2  . enoxaparin (LOVENOX) 100 MG/ML injection Inject 1 mL (100 mg total) into the skin daily. As directed by coumadin clinic 10 mL 0  . ferrous fumarate (FERRO-SEQUELS) 18 MG CR tablet Take 18 mg by mouth daily.    . furosemide (LASIX) 40 MG tablet Take 1 tablet (40 mg total) by mouth daily. (Patient taking differently: Take 40 mg by mouth daily as needed for edema. ) 90 tablet 3  . Magnesium 250 MG TABS Take 250 mg by mouth daily.    . nitroGLYCERIN (NITROSTAT) 0.4 MG SL tablet Place 0.4 mg under the tongue every 5 (five) minutes as needed for chest pain.    . potassium chloride SA (KLOR-CON) 20 MEQ tablet Take 1 tablet daily for potassium replacement. (Patient taking differently: Take 20 mEq by mouth daily. ) 90 tablet 3  . telmisartan (MICARDIS) 80 MG tablet Take 1 tablet (80 mg total) by mouth daily.  90 tablet 3  . traMADol (ULTRAM) 50 MG tablet Take 1 tablet every 4 hours as needed for Pain (Patient not taking: Reported on 12/03/2019) 12 tablet 0  . traMADol (ULTRAM) 50 MG tablet Take 1 tablet (50 mg total) by mouth every 6 (six) hours as needed for moderate pain or severe pain. 15 tablet 0  . warfarin (COUMADIN) 3 MG tablet TAKE 1 TO 1 AND 1/2 TABLET BY MOUTH AS DIRECTED (Patient taking differently:  Take 3-4.5 mg by mouth See admin instructions. Mon , Tues, - 1.5 tablets  Fri, Sat, Sun, Mon - 1.5 tablets  Wed + Thurs - 1 tablet) 135 tablet 1  . zinc gluconate 50 MG tablet Take 50 mg by mouth daily.     No current facility-administered medications for this visit.    PHYSICAL EXAMINATION: ECOG PERFORMANCE STATUS: 1 - Symptomatic but completely ambulatory  Vitals:   12/20/19 1436  BP: (!) 155/73  Pulse: 72  Resp: 18  Temp: 99.3 F (37.4 C)  SpO2: 97%   Filed Weights   12/20/19 1436  Weight: 144 lb 6.4 oz (65.5 kg)    LABORATORY DATA:  I have reviewed the data as listed CMP Latest Ref Rng & Units 12/14/2019 12/06/2019 09/25/2019  Glucose 70 - 99 mg/dL 157(H) 97 89  BUN 8 - 23 mg/dL _0 Creatinine 0.44 - 1.00 mg/dL 0.80 0.73 0.83  Sodium 135 - 145 mmol/L 140 140 142  Potassium 3.5 - 5.1 mmol/L 5.1 4.4 4.6  Chloride 98 - 111 mmol/L 103 103 103  CO2 22 - 32 mmol/L 27 28 32  Calcium 8.9 - 10.3 mg/dL 9.5 9.0 9.6  Total Protein 6.1 - 8.1 g/dL - - 6.6  Total Bilirubin 0.2 - 1.2 mg/dL - - 0.6  Alkaline Phos 38 - 126 U/L - - -  AST 10 - 35 U/L - - 18  ALT 6 - 29 U/L - - 12    Lab Results  Component Value Date   WBC 5.1 12/14/2019   HGB 12.7 12/14/2019   HCT 40.1 12/14/2019   MCV 94.6 12/14/2019   PLT 178 12/14/2019   NEUTROABS 4,253 09/25/2019    ASSESSMENT & PLAN:  Malignant neoplasm of upper-outer quadrant of right breast in female, estrogen receptor positive ([place]) 12/13/19: Right lumpectomy ([facility]): invasive lobular carcinoma, grade 2, 2.4cm, clear margins.  ER 80%, PR 50%, HER-2 negative, Ki-67 5%  Pathology counseling: I discussed the final pathology report of the patient provided  a copy of this report. I discussed the margins as well as lymph node surgeries. We also discussed the final staging along with previously performed ER/PR and HER-2/neu testing.  Treatment plan: Anastrozole adjuvant therapy 1 mg daily x5 years Anastrozole toxicities: Occasional hot  flashes but otherwise tolerating it extremely well.  Return to clinic in 6 months    No orders of the defined types were placed in this encounter.  The patient has a good understanding of the overall plan. she agrees with it. she will call with any problems that may develop before the next visit here.  Total time spent: 30 mins including face to face time and time spent for planning, charting and coordination of care  [name], MD 12/20/2019  I, Cloyde Reams Dorshimer, am acting as scribe for Dr. [name].  I have reviewed the above documentation for accuracy and completeness, and I agree with the above.

## 2019-12-20 ENCOUNTER — Inpatient Hospital Stay: Payer: Medicare Other | Attending: Hematology and Oncology | Admitting: Hematology and Oncology

## 2019-12-20 ENCOUNTER — Other Ambulatory Visit: Payer: Self-pay

## 2019-12-20 ENCOUNTER — Encounter: Payer: Self-pay | Admitting: *Deleted

## 2019-12-20 ENCOUNTER — Telehealth: Payer: Self-pay | Admitting: Hematology and Oncology

## 2019-12-20 DIAGNOSIS — C50411 Malignant neoplasm of upper-outer quadrant of right female breast: Secondary | ICD-10-CM | POA: Insufficient documentation

## 2019-12-20 DIAGNOSIS — Z170 Estrogen receptor positive status [ER+]: Secondary | ICD-10-CM | POA: Diagnosis not present

## 2019-12-20 DIAGNOSIS — Z7901 Long term (current) use of anticoagulants: Secondary | ICD-10-CM | POA: Diagnosis not present

## 2019-12-20 DIAGNOSIS — Z79899 Other long term (current) drug therapy: Secondary | ICD-10-CM | POA: Insufficient documentation

## 2019-12-20 NOTE — Assessment & Plan Note (Signed)
12/13/19: Right lumpectomy ([facility]): invasive lobular carcinoma, grade 2, 2.4cm, clear margins.  ER 80%, PR 50%, HER-2 negative, Ki-67 5%  Pathology counseling: I discussed the final pathology report of the patient provided  a copy of this report. I discussed the margins as well as lymph node surgeries. We also discussed the final staging along with previously performed ER/PR and HER-2/neu testing.  Treatment plan: Anastrozole adjuvant therapy 1 mg daily x5 years if she tolerates it Return to clinic in 6 months

## 2019-12-20 NOTE — Telephone Encounter (Signed)
scheduled appts per 8/19 los. Gave pt a print out of AVS.

## 2019-12-21 ENCOUNTER — Ambulatory Visit (INDEPENDENT_AMBULATORY_CARE_PROVIDER_SITE_OTHER): Payer: Medicare Other

## 2019-12-21 DIAGNOSIS — Z5181 Encounter for therapeutic drug level monitoring: Secondary | ICD-10-CM

## 2019-12-21 DIAGNOSIS — Z7901 Long term (current) use of anticoagulants: Secondary | ICD-10-CM

## 2019-12-21 DIAGNOSIS — I482 Chronic atrial fibrillation, unspecified: Secondary | ICD-10-CM

## 2019-12-21 LAB — POCT INR: INR: 2 (ref 2.0–3.0)

## 2019-12-21 NOTE — Patient Instructions (Signed)
Continue taking 1.5 tablets daily except 1 tablet Sunday, Tuesday and Thursday. Repeat INR 3 weeks. Call if you have questions [phone] (Kristin/Raquel)

## 2019-12-31 ENCOUNTER — Ambulatory Visit: Payer: Medicare Other | Admitting: Physician Assistant

## 2020-01-07 ENCOUNTER — Ambulatory Visit: Payer: Medicare Other | Admitting: Physician Assistant

## 2020-01-08 ENCOUNTER — Ambulatory Visit (INDEPENDENT_AMBULATORY_CARE_PROVIDER_SITE_OTHER): Payer: Medicare Other | Admitting: Adult Health Nurse Practitioner

## 2020-01-08 ENCOUNTER — Other Ambulatory Visit: Payer: Self-pay

## 2020-01-08 ENCOUNTER — Encounter: Payer: Self-pay | Admitting: Adult Health Nurse Practitioner

## 2020-01-08 VITALS — BP 128/64 | HR 83 | Temp 97.9°F | Wt 144.8 lb

## 2020-01-08 DIAGNOSIS — Z0001 Encounter for general adult medical examination with abnormal findings: Secondary | ICD-10-CM

## 2020-01-08 DIAGNOSIS — I482 Chronic atrial fibrillation, unspecified: Secondary | ICD-10-CM | POA: Diagnosis not present

## 2020-01-08 DIAGNOSIS — M1990 Unspecified osteoarthritis, unspecified site: Secondary | ICD-10-CM

## 2020-01-08 DIAGNOSIS — K219 Gastro-esophageal reflux disease without esophagitis: Secondary | ICD-10-CM

## 2020-01-08 DIAGNOSIS — R6889 Other general symptoms and signs: Secondary | ICD-10-CM

## 2020-01-08 DIAGNOSIS — G4700 Insomnia, unspecified: Secondary | ICD-10-CM

## 2020-01-08 DIAGNOSIS — I5032 Chronic diastolic (congestive) heart failure: Secondary | ICD-10-CM | POA: Diagnosis not present

## 2020-01-08 DIAGNOSIS — E782 Mixed hyperlipidemia: Secondary | ICD-10-CM

## 2020-01-08 DIAGNOSIS — Z950 Presence of cardiac pacemaker: Secondary | ICD-10-CM

## 2020-01-08 DIAGNOSIS — D6869 Other thrombophilia: Secondary | ICD-10-CM | POA: Diagnosis not present

## 2020-01-08 DIAGNOSIS — I2510 Atherosclerotic heart disease of native coronary artery without angina pectoris: Secondary | ICD-10-CM

## 2020-01-08 DIAGNOSIS — Z79899 Other long term (current) drug therapy: Secondary | ICD-10-CM

## 2020-01-08 DIAGNOSIS — I10 Essential (primary) hypertension: Secondary | ICD-10-CM

## 2020-01-08 DIAGNOSIS — C50411 Malignant neoplasm of upper-outer quadrant of right female breast: Secondary | ICD-10-CM | POA: Diagnosis not present

## 2020-01-08 DIAGNOSIS — Z170 Estrogen receptor positive status [ER+]: Secondary | ICD-10-CM

## 2020-01-08 DIAGNOSIS — E559 Vitamin D deficiency, unspecified: Secondary | ICD-10-CM

## 2020-01-08 DIAGNOSIS — Z0000 Encounter for general adult medical examination without abnormal findings: Secondary | ICD-10-CM

## 2020-01-08 DIAGNOSIS — Z7901 Long term (current) use of anticoagulants: Secondary | ICD-10-CM

## 2020-01-08 DIAGNOSIS — R7309 Other abnormal glucose: Secondary | ICD-10-CM

## 2020-01-08 NOTE — Progress Notes (Signed)
[name], female   DOB: [birth year], 86 y.o.   MRN: [id number]  MEDICARE ANNUAL WELLNESS VISIT AND FOLLOW UP   Assessment:    Encounter for Medicare annual wellness exam Yearly  Essential hypertension Continue medications: Telmisartan 72m daily Will increase micardis to 1 pill a day Monitor blood pressure at home; call if consistently over 130/80 Continue DASH diet.   Reminder to go to the ER if any CP, SOB, nausea, dizziness, severe HA, changes vision/speech, left arm numbness and tingling and jaw pain.  Atherosclerosis of native coronary artery of native heart with angina pectoris ([place] Control blood pressure, cholesterol, glucose, increase exercise.  Followed closely by cardiology Increase micardis to 1 pill and continue to monitor BP No chest pains recently, had perfusion studies and ECHO in April 2019   Chronic diastolic heart failure (HCC) Weights stable, monitor, no symptoms at this time  Permanent atrial fibrillation ([place] Followed by coumadin clinic Reminded to go to ER for any head injury, call with concerning signs of bleeding  Long term current use of anticoagulant therapy Acquired Thrombophilia ([place] Followed by cardiology Coumadin 344m 1.5 tablets four days a week and 1 tablet three days a week. No S&S of bleeding  Gastroesophageal reflux disease, esophagitis presence not specified Symptoms controlled at this time, continue current regiment  lifestyle emphasized  Osteoarthritis, unspecified osteoarthritis type, unspecified site Mild in hands,  Doing well at this time  Vitamin D deficiency At goal at recent check;  Defer vitamin D level  Abnormal glucose Recent A1Cs at goal Discussed diet/exercise, weight management  Defer A1C; check CMP  Pacemaker Followed by cardiology  Medication management CBC, CMP/GFR  Hyperlipidemia No longer on medication secondary to age Continue low cholesterol diet and exercise.  Check lipid  panel.   Overweight (BMI 25.0-29.9) Continue to recommend diet heavy in fruits and veggies and low in animal meats, cheeses, and dairy products, appropriate calorie intake Discuss exercise recommendations routinely Continue to monitor weight at each visit   Malignant neoplasm of upper-outer quadrant of right breast in female, estrogen receptor positive ([place]Follows with Oncology, Dr GuLindi AdieNeuropathy BLE,  Soaks feet [name] & [name] helps   Insomnia Doing well at this time  Medication Management Continued   Over 30 minutes of face to face interview, exam, counseling, chart review, and critical decision making was performed  Future Appointments  Date Time Provider [place]9/15/2021  7:25 AM [facility] DEVICE REMOTES [facility]  01/25/2020  8:45 AM [facility]  02/21/2020  3:00 PM GuNicholas LoseMD [facility] None  04/08/2020 11:00 AM McGarnet SierrasNP [facility] None  06/26/2020  2:15 PM GuNicholas LoseMD [facility] None  01/13/2021 11:30 AM McGarnet SierrasNP [facility] None     Plan:   During the course of the visit the patient was educated and counseled about appropriate screening and preventive services including:    Pneumococcal vaccine   Influenza vaccine  Td vaccine  Prevnar 13  Screening electrocardiogram  Screening mammography  Bone densitometry screening  Colorectal cancer screening  Diabetes screening  Glaucoma screening  Nutrition counseling   Advanced directives: given info/requested copies    Subjective:   Misty Blackwell a [age].o. female who presents for Medicare Annual Wellness Visit and 6 month follow up.    She has history of breast cancer and had right s/p lumpectomy 8/21.   ([place]): invasive lobular carcinoma, grade 2, 2.4cm, clear margins.  ER 80%,  PR 50%, HER-2 negative, Ki-67 5%.  Her treatment Anastozole therapy 1mg  x5 years and follows with Dr  Lindi Adie and follows every 6 months  Patient had negative /Nl Ht caths in x2 in 1993 & 2007.  In 2003, her Myoview was Negative for Ischemia.   In 2006 , she had an embolic CVA with Rt hemifacial paresthesias (resolved) and was found in Afib and has been on Coumadin since - monitored at [facility] (last INR 2.5).  She had a PPM implanted in 2007 for SSS and is followed by Dr Sallyanne Kuster. She recently had perfusion studies and ECHO in April of 2019 with good results.  Had probable TIA in Dec with aphasia that resolved, started on statin at that time.  She is on 1.5 tablets 4 days a week and 1 tablet other days, recently adjusted.  She follows with Dr Roseanna Rainbow at Coumadin clinic.    Lab Results  Component Value Date   INR 1.5 (A) 01/11/2020   INR 2.0 12/21/2019   INR 1.3 (A) 12/19/2019   She is on lexapro and xanax AS needed, doing well at this time, no longer having depression. She continues to paint as a hobby.  BMI is Body mass index is 24.47 kg/m., she has been working on diet and exercise. Wt Readings from Last 3 Encounters:  01/08/20 144 lb 12.8 oz (65.7 kg)  12/20/19 144 lb 6.4 oz (65.5 kg)  12/13/19 144 lb (65.3 kg)   Her blood pressure has been controlled at home, today their BP is  BP Readings from Last 3 Encounters:  01/08/20 128/64  12/20/19 (!) 155/73  12/14/19 (!) 166/72    She does not workout. She denies chest pain, shortness of breath, dizziness.   She is on cholesterol medication due to recent TIA with aphasia that resolved in Dec.. Her cholesterol is not at goal. The cholesterol last visit was:   Lab Results  Component Value Date   CHOL 139 09/25/2019   HDL 44 (L) 09/25/2019   LDLCALC 71 09/25/2019   TRIG 160 (H) 09/25/2019   CHOLHDL 3.2 09/25/2019   She has history of prediabetes (A1c 5.9% 2011) Last A1C in the office was:  Lab Results  Component Value Date   HGBA1C 5.3 09/25/2019     Lab Results  Component Value Date   GFRNONAA 63 01/08/2020    Patient is on Vitamin D supplement for deficiency (13, 2008).   Lab Results  Component Value Date   VD25OH 50 09/25/2019      Medication Review Current Outpatient Medications on File Prior to Visit  Medication Sig Dispense Refill  . acidophilus (RISAQUAD) CAPS capsule Take 1 capsule by mouth daily.    Marland Kitchen anastrozole (ARIMIDEX) 1 MG tablet Take 1 tablet (1 mg total) by mouth daily. 90 tablet 3  . Ascorbic Acid (VITAMIN C) 1000 MG tablet Take 1,000 mg by mouth daily.     . Cholecalciferol (VITAMIN D) 125 MCG (5000 UT) CAPS Take 5,000 Units by mouth daily.    . Cyanocobalamin (VITAMIN B 12 PO) Take 1,000 mcg by mouth daily.     Marland Kitchen diltiazem (CARDIZEM CD) 180 MG 24 hr capsule TAKE 1 CAPSULE BY MOUTH EVERY DAY (Patient taking differently: Take 180 mg by mouth daily. ) 90 capsule 2  . enoxaparin (LOVENOX) 100 MG/ML injection Inject 1 mL (100 mg total) into the skin daily. As directed by coumadin clinic 10 mL 0  . ferrous fumarate (FERRO-SEQUELS) 18 MG CR tablet Take  18 mg by mouth daily.    . furosemide (LASIX) 40 MG tablet Take 1 tablet (40 mg total) by mouth daily. (Patient taking differently: Take 40 mg by mouth daily as needed for edema. ) 90 tablet 3  . Magnesium 250 MG TABS Take 250 mg by mouth daily.    . potassium chloride SA (KLOR-CON) 20 MEQ tablet Take 1 tablet daily for potassium replacement. (Patient taking differently: Take 20 mEq by mouth daily. ) 90 tablet 3  . telmisartan (MICARDIS) 80 MG tablet Take 1 tablet (80 mg total) by mouth daily. 90 tablet 3  . traMADol (ULTRAM) 50 MG tablet Take 1 tablet every 4 hours as needed for Pain 12 tablet 0  . traMADol (ULTRAM) 50 MG tablet Take 1 tablet (50 mg total) by mouth every 6 (six) hours as needed for moderate pain or severe pain. 15 tablet 0  . warfarin (COUMADIN) 3 MG tablet TAKE 1 TO 1 AND 1/2 TABLET BY MOUTH AS DIRECTED (Patient taking differently: Take 3-4.5 mg by mouth See admin instructions. Mon , Tues, - 1.5 tablets  Fri, Sat,  Sun, Mon - 1.5 tablets  Wed + Thurs - 1 tablet) 135 tablet 1  . zinc gluconate 50 MG tablet Take 50 mg by mouth daily.    . nitroGLYCERIN (NITROSTAT) 0.4 MG SL tablet Place 0.4 mg under the tongue every 5 (five) minutes as needed for chest pain. (Patient not taking: Reported on 01/08/2020)     No current facility-administered medications on file prior to visit.    Current Problems (verified) Patient Active Problem List   Diagnosis Date Noted  . Invasive ductal carcinoma of breast, female, right ([place]) 12/13/2019  . Malignant neoplasm of upper-outer quadrant of right breast in female, estrogen receptor positive ([place]) 11/21/2019  . Anxiety 06/18/2019  . Insomnia 06/18/2019  . Recurrent major 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 ([place]) 06/18/2019  . SSS (sick sinus syndrome) ([place]) 09/26/2018  . Abnormal glucose 08/29/2018  . Neural foraminal stenosis of cervical spine 04/18/2018  . History of TIA (transient ischemic attack) 04/17/2018  . Chronic atrial fibrillation ([place])   . Overweight (BMI 25.0-29.9) 11/21/2017  . Chronic diastolic heart failure ([place]) 11/10/2017  . Vitamin D deficiency 08/15/2013  . Medication management 08/15/2013  . Pacemaker 09/19/2012  . Long term current use of anticoagulant therapy 07/18/2012  . Hyperlipidemia, mixed 09/03/2008  . Essential hypertension 09/03/2008  . Coronary atherosclerosis 09/03/2008  . GERD 09/03/2008  . FIBROCYSTIC BREAST DISEASE 09/03/2008  . Osteoarthritis 09/03/2008    Screening Tests Immunization History  Administered Date(s) Administered  . DT (Pediatric) 03/19/2015  . Influenza Split 05/04/2011  . Influenza, High Dose Seasonal PF 03/19/2014, 12/23/2015, 02/03/2017, 12/26/2018  . Influenza,inj,quad, With Preservative 05/21/2013  . Influenza-Unspecified 02/04/2015, 02/03/2017  . PFIZER SARS-COV-2 Vaccination 06/16/2019, 07/09/2019  . Pneumococcal Conjugate-13 03/19/2014  . Pneumococcal Polysaccharide-23 05/04/2011  .  Pneumococcal-Unspecified 05/03/2001  . Td 05/04/2003    Preventative care: Last colonoscopy: 2010 declines due to age.  Last mammogram: refused DEXA declines CT head without contrast 04/2018 CXR 05/2018 Stress test 08/2017 Echo 04/2018  Prior vaccinations: TD or Tdap: 2016  Influenza: 2019 Pneumococcal: 2003 Prevnar13: 2015  Shingles/Zostavax: Declined  Names of Other Physician/Practitioners you currently use: 1. [facility]- here for primary care 2. Dr. Katy Fitch , eye doctor, last visit 2021 has upcoming appointment 3. Dr. Ronnald Ramp, dentist, last visit 2021  Patient Care Team: [name], MD as PCP - General (Internal Medicine) Sanda Klein, MD  as PCP - Cardiology (Cardiology) [name], MD as Consulting Physician (Urology) [name], [name], MD as Consulting Physician (Neurology) [name], MD as Consulting Physician (Neurology) [name], [name], MD as Consulting Physician (Cardiology) [name], MD (Inactive) as Consulting Physician (Gastroenterology) [name], RN as Oncology Nurse Navigator [name], RN as Oncology Nurse Navigator  Allergies Allergies  Allergen Reactions  . Latex Itching  . Ace Inhibitors Other (See Comments)    Unknown reaction  . Augmentin [Amoxicillin-Pot Clavulanate] Other (See Comments)  . Ciprofloxacin Other (See Comments)  . Levaquin [Levofloxacin In D5w] Other (See Comments)  . Zocor [Simvastatin] Other (See Comments)  . Acrylic Polymer [Carbomer] Itching  . Chocolate Other (See Comments)    migraine's   . Gabapentin Other (See Comments)    Unsteady gait     SURGICAL HISTORY She  has a past surgical history that includes Cardioversion (11/19/2009); Cardiac catheterization (08/06/2005); Cholecystectomy; Appendectomy; Abdominal hysterectomy; Skin cancer resection; Tonsillectomy (1938); Breast surgery (Left, 1949); Eye surgery (Bilateral, 2013); and Breast lumpectomy  with radioactive seed localization (Right, 12/13/2019). FAMILY HISTORY Her family history includes Cirrhosis in her brother; Diabetes in her mother; Heart attack in her father; Heart disease in her father and mother. SOCIAL HISTORY She  reports that she has never smoked. She has never used smokeless tobacco. She reports that she does not drink alcohol and does not use drugs.  MEDICARE WELLNESS OBJECTIVES: Physical activity: Current Exercise Habits: Home exercise routine, Type of exercise: calisthenics, Time (Minutes): 20, Frequency (Times/Week): 7, Weekly Exercise (Minutes/Week): 140, Intensity: Mild, Exercise limited by: None identified Cardiac risk factors: Cardiac Risk Factors include: advanced age (>18mn, >>20women);dyslipidemia;family history of premature cardiovascular disease;hypertension Depression/mood screen:   [facility]  Decreased Interest 0  Down, Depressed, Hopeless 0  PHQ - 2 Score 0  Altered sleeping -  Tired, decreased energy -  Change in appetite -  Feeling bad or failure about yourself  -  Trouble concentrating -  Moving slowly or fidgety/restless -  Suicidal thoughts -  PHQ-9 Score -  Difficult doing work/chores -  Some recent data might be hidden    ADLs:  In your present state of health, do you have any difficulty performing the following activities: 01/08/2020 12/13/2019  Hearing? Y Y  Comment hearing aid -  Vision? N N  Difficulty concentrating or making decisions? N N  Walking or climbing stairs? N N  Dressing or bathing? N N  Doing errands, shopping? N N  Preparing Food and eating ? N -  Using the Toilet? N -  In the past six months, have you accidently leaked urine? N -  Do you have problems with loss of bowel control? N -  Managing your Medications? N -  Managing your Finances? N -  Housekeeping or managing your Housekeeping? N -  Some recent data might be hidden     Cognitive Testing  Alert? Yes  Normal  Appearance?Yes  Oriented to person? Yes  Place? Yes   Time? Yes  Recall of three objects?  Yes  Can perform simple calculations? Yes  Displays appropriate judgment?Yes  Can read the correct time from a watch face?Yes  EOL planning: Does Patient Have a Medical Advance Directive?: Yes Type of Advance Directive: Healthcare Power of Attorney   Objective:   Today's Vitals   01/08/20 1014  BP: 128/64  Pulse: 83  Temp: 97.9 F (36.6 C)  SpO2: 95%  Weight: 144 lb 12.8 oz (  65.7 kg)   Body mass index is 24.47 kg/m.  Physical Exam:  BP 128/64   Pulse 83   Temp 97.9 F (36.6 C)   Wt 144 lb 12.8 oz (65.7 kg)   SpO2 95%   BMI 24.47 kg/m   General Appearance: Well nourished, in no apparent distress. Eyes: PERRLA, EOMs, conjunctiva no swelling or erythema Sinuses: No Frontal/maxillary tenderness ENT/Mouth: Ext aud canals clear, TMs without erythema, bulging. No erythema, swelling, or exudate on post pharynx.  Tonsils not swollen or erythematous. Hearing normal.  Neck: Supple, thyroid normal.  Respiratory: Respiratory effort normal, BS equal bilaterally without rales, rhonchi, wheezing or stridor.  Cardio: RRR with no MRGs. Brisk peripheral pulses with scant non-pitting edema   Abdomen: Soft, obese/mildly distended, + BS.  Non tender, no guarding, rebound, hernias, masses. Lymphatics: Non tender without lymphadenopathy.  Musculoskeletal: Full ROM, 5/5 strength, normal gait.  Skin: Warm, dry without rashes, lesions; he has fragile skin and numerous small ecchymoses to bilateral upper extremities Neuro: Cranial nerves intact. Normal muscle tone, no cerebellar symptoms. Sensation intact.  Psych: Awake and oriented X 3, normal affect, Insight and Judgment appropriate.   Medicare Attestation I have personally reviewed: The patient's medical and social history Their use of alcohol, tobacco or illicit drugs Their current medications and supplements The patient's functional ability  including ADLs,fall risks, home safety risks, cognitive, and hearing and visual impairment Diet and physical activities Evidence for depression or mood disorders  The patient's weight, height, BMI, and visual acuity have been recorded in the chart.  I have made referrals, counseling, and provided education to the patient based on review of the above and I have provided the patient with a written personalized care plan for preventive services.     Garnet Sierras, NP   01/08/2020

## 2020-01-09 LAB — COMPLETE METABOLIC PANEL WITH GFR
AG Ratio: 1.7 (calc) (ref 1.0–2.5)
ALT: 10 U/L (ref 6–29)
AST: 15 U/L (ref 10–35)
Albumin: 4.7 g/dL (ref 3.6–5.1)
Alkaline phosphatase (APISO): 139 U/L (ref 37–153)
BUN: 19 mg/dL (ref 7–25)
CO2: 24 mmol/L (ref 20–32)
Calcium: 10.1 mg/dL (ref 8.6–10.4)
Chloride: 113 mmol/L — ABNORMAL HIGH (ref 98–110)
Creat: 0.82 mg/dL (ref 0.60–0.88)
GFR, Est African American: 73 mL/min/{1.73_m2} (ref >=60)
GFR, Est Non African American: 63 mL/min/{1.73_m2} (ref >=60)
Globulin: 2.8 g/dL (calc) (ref 1.9–3.7)
Glucose, Bld: 98 mg/dL (ref 65–99)
Potassium: 5.3 mmol/L (ref 3.5–5.3)
Sodium: 157 mmol/L — ABNORMAL HIGH (ref 135–146)
Total Bilirubin: 0.4 mg/dL (ref 0.2–1.2)
Total Protein: 7.5 g/dL (ref 6.1–8.1)

## 2020-01-09 LAB — CBC WITH DIFFERENTIAL/PLATELET
Absolute Monocytes: 1028 cells/uL — ABNORMAL HIGH (ref 200–950)
Basophils Absolute: 68 cells/uL (ref 0–200)
Basophils Relative: 0.9 %
Eosinophils Absolute: 390 cells/uL (ref 15–500)
Eosinophils Relative: 5.2 %
HCT: 40.8 % (ref 35.0–45.0)
Hemoglobin: 13.2 g/dL (ref 11.7–15.5)
Lymphs Abs: 2010 cells/uL (ref 850–3900)
MCH: 29.7 pg (ref 27.0–33.0)
MCHC: 32.4 g/dL (ref 32.0–36.0)
MCV: 91.9 fL (ref 80.0–100.0)
MPV: 10.3 fL (ref 7.5–12.5)
Monocytes Relative: 13.7 %
Neutro Abs: 4005 cells/uL (ref 1500–7800)
Neutrophils Relative %: 53.4 %
Platelets: 255 10*3/uL (ref 140–400)
RBC: 4.44 10*6/uL (ref 3.80–5.10)
RDW: 11.8 % (ref 11.0–15.0)
Total Lymphocyte: 26.8 %
WBC: 7.5 10*3/uL (ref 3.8–10.8)

## 2020-01-11 ENCOUNTER — Other Ambulatory Visit: Payer: Self-pay

## 2020-01-11 ENCOUNTER — Ambulatory Visit (INDEPENDENT_AMBULATORY_CARE_PROVIDER_SITE_OTHER): Payer: Medicare Other

## 2020-01-11 DIAGNOSIS — Z7901 Long term (current) use of anticoagulants: Secondary | ICD-10-CM | POA: Diagnosis not present

## 2020-01-11 DIAGNOSIS — I482 Chronic atrial fibrillation, unspecified: Secondary | ICD-10-CM

## 2020-01-11 LAB — POCT INR: INR: 1.5 — AB (ref 2.0–3.0)

## 2020-01-11 NOTE — Patient Instructions (Signed)
Take 2.5 tablets today and then Continue taking 1.5 tablets daily except 1 tablet Sunday, Tuesday and Thursday. Repeat INR 2 weeks. Call if you have questions [phone] (Kristin/Raquel)

## 2020-01-25 ENCOUNTER — Telehealth: Payer: Self-pay | Admitting: *Deleted

## 2020-01-25 ENCOUNTER — Ambulatory Visit (INDEPENDENT_AMBULATORY_CARE_PROVIDER_SITE_OTHER): Payer: Medicare Other

## 2020-01-25 ENCOUNTER — Other Ambulatory Visit: Payer: Self-pay

## 2020-01-25 DIAGNOSIS — I482 Chronic atrial fibrillation, unspecified: Secondary | ICD-10-CM | POA: Diagnosis not present

## 2020-01-25 DIAGNOSIS — Z5181 Encounter for therapeutic drug level monitoring: Secondary | ICD-10-CM | POA: Diagnosis not present

## 2020-01-25 DIAGNOSIS — Z7901 Long term (current) use of anticoagulants: Secondary | ICD-10-CM

## 2020-01-25 LAB — POCT INR: INR: 2.2 (ref 2.0–3.0)

## 2020-01-25 NOTE — Telephone Encounter (Signed)
Returning patient call to help with monitor.   No answer, LMOVM.

## 2020-01-25 NOTE — Telephone Encounter (Signed)
The patient came in today concerned about her transmitter. She stated that she received a notice that her last transmission did not got through. She stated that she called [employer] and they sent her a new cord but this does not work either. They then stated that she would need to contact the office in order to get a new transmitter.   Message sent to the device clinic to see if they can help.

## 2020-01-25 NOTE — Patient Instructions (Signed)
Continue taking 1.5 tablets daily except 1 tablet Sunday, Tuesday and Thursday. Repeat INR 4 weeks. Call if you have questions [phone] (Kristin/Raquel)

## 2020-01-28 NOTE — Telephone Encounter (Signed)
I spoke with the pt to help her with the monitor. I tried to call tech support but I was having problems getting through. I told her I will call her tomorrow to help with the monitor. The pt verbalized understanding.

## 2020-02-01 ENCOUNTER — Other Ambulatory Visit: Payer: Self-pay | Admitting: Internal Medicine

## 2020-02-04 NOTE — Telephone Encounter (Signed)
[employer] is sending the pt a new wirex adapter.

## 2020-02-08 ENCOUNTER — Ambulatory Visit (INDEPENDENT_AMBULATORY_CARE_PROVIDER_SITE_OTHER): Payer: Medicare Other

## 2020-02-08 DIAGNOSIS — I495 Sick sinus syndrome: Secondary | ICD-10-CM | POA: Diagnosis not present

## 2020-02-09 LAB — CUP PACEART REMOTE DEVICE CHECK
Battery Impedance: 2970 Ohm
Battery Remaining Longevity: 23 mo
Battery Voltage: 2.74 V
Brady Statistic RV Percent Paced: 85 %
Date Time Interrogation Session: 20211008113238
Implantable Lead Implant Date: 20110428
Implantable Lead Implant Date: 20110428
Implantable Lead Location: 753859
Implantable Lead Location: 753860
Implantable Lead Model: 4092
Implantable Lead Model: 4592
Implantable Pulse Generator Implant Date: 20110428
Lead Channel Impedance Value: 67 Ohm
Lead Channel Impedance Value: 728 Ohm
Lead Channel Pacing Threshold Amplitude: 0.875 V
Lead Channel Pacing Threshold Pulse Width: 0.4 ms
Lead Channel Setting Pacing Amplitude: 2.5 V
Lead Channel Setting Pacing Pulse Width: 0.4 ms
Lead Channel Setting Sensing Sensitivity: 4 mV

## 2020-02-12 NOTE — Progress Notes (Signed)
Remote pacemaker transmission.   

## 2020-02-19 ENCOUNTER — Other Ambulatory Visit: Payer: Self-pay

## 2020-02-19 ENCOUNTER — Ambulatory Visit (INDEPENDENT_AMBULATORY_CARE_PROVIDER_SITE_OTHER): Payer: Medicare Other | Admitting: Internal Medicine

## 2020-02-19 ENCOUNTER — Encounter: Payer: Self-pay | Admitting: Internal Medicine

## 2020-02-19 VITALS — BP 116/72 | HR 70 | Temp 97.0°F | Resp 16 | Ht 64.0 in | Wt 143.6 lb

## 2020-02-19 DIAGNOSIS — N393 Stress incontinence (female) (male): Secondary | ICD-10-CM

## 2020-02-19 DIAGNOSIS — R300 Dysuria: Secondary | ICD-10-CM

## 2020-02-19 DIAGNOSIS — F5101 Primary insomnia: Secondary | ICD-10-CM | POA: Diagnosis not present

## 2020-02-19 DIAGNOSIS — N3289 Other specified disorders of bladder: Secondary | ICD-10-CM

## 2020-02-19 MED ORDER — HYDROXYZINE HCL 50 MG PO TABS: ORAL_TABLET | ORAL | 0 refills | Status: DC

## 2020-02-19 MED ORDER — NITROFURANTOIN MONOHYD MACRO 100 MG PO CAPS: ORAL_CAPSULE | ORAL | 0 refills | Status: DC

## 2020-02-19 NOTE — Progress Notes (Signed)
Dysuria   History of Present Illness:     This very nice 86 y.o.  [place]   with HTN, ASHD/cAfib,  HLD, Pre-Diabetes,Vitamin D Deficiency and recent dx/o Rt Breast cancer presents with c/o urinary frequency, dysuria, urge  & stress incontinence and suprapubic bladder spasms. She denies any fever, chills, sweats, rash or vaginal discharge or bleeding.  She also reports poor sleep hygiene with difficulty staying asleep and requests a sleep med.  Medications   Current Outpatient Medications (Cardiovascular):  .  diltiazem (CARDIZEM CD) 180 MG 24 hr capsule, TAKE 1 CAPSULE BY MOUTH EVERY DAY (Patient taking differently: Take 180 mg by mouth daily. ) .  furosemide (LASIX) 40 MG tablet, Take 1 tablet (40 mg total) by mouth daily. (Patient taking differently: Take 40 mg by mouth daily as needed for edema. ) .  nitroGLYCERIN (NITROSTAT) 0.4 MG SL tablet, Place 0.4 mg under the tongue every 5 (five) minutes as needed for chest pain. (Patient not taking: Reported on 01/08/2020) .  telmisartan (MICARDIS) 80 MG tablet, Take 1 tablet (80 mg total) by mouth daily.   Current Outpatient Medications (Analgesics):  .  traMADol (ULTRAM) 50 MG tablet, Take 1 tablet every 4 hours as needed for Pain .  traMADol (ULTRAM) 50 MG tablet, Take 1 tablet (50 mg total) by mouth every 6 (six) hours as needed for moderate pain or severe pain.  Current Outpatient Medications (Hematological):  Marland Kitchen  Cyanocobalamin (VITAMIN B 12 PO), Take 1,000 mcg by mouth daily.  Marland Kitchen  enoxaparin (LOVENOX) 100 MG/ML injection, Inject 1 mL (100 mg total) into the skin daily. As directed by coumadin clinic .  ferrous fumarate (FERRO-SEQUELS) 18 MG CR tablet, Take 18 mg by mouth daily. Marland Kitchen  warfarin (COUMADIN) 3 MG tablet, TAKE 1 TO 1 AND 1/2 TABLETS BY MOUTH AS DIRECTED  Current Outpatient Medications (Other):  .  acidophilus (RISAQUAD) CAPS capsule, Take 1 capsule by mouth daily. Marland Kitchen  anastrozole (ARIMIDEX) 1 MG tablet, Take 1 tablet (1 mg total) by  mouth daily. .  Ascorbic Acid (VITAMIN C) 1000 MG tablet, Take 1,000 mg by mouth daily.  .  Cholecalciferol (VITAMIN D) 125 MCG (5000 UT) CAPS, Take 5,000 Units by mouth daily. .  Magnesium 250 MG TABS, Take 250 mg by mouth daily. .  potassium chloride SA (KLOR-CON) 20 MEQ tablet, Take 1 tablet daily for potassium replacement. (Patient taking differently: Take 20 mEq by mouth daily. ) .  zinc gluconate 50 MG tablet, Take 50 mg by mouth daily.  Problem list She has Hyperlipidemia, mixed; Essential hypertension; Coronary atherosclerosis; GERD; FIBROCYSTIC BREAST DISEASE; Osteoarthritis; Long term current use of anticoagulant therapy; Pacemaker; Vitamin D deficiency; Medication management; Chronic diastolic heart failure ([place]); Overweight (BMI 25.0-29.9); History of TIA (transient ischemic attack); Chronic atrial fibrillation ([place]); Neural foraminal stenosis of cervical spine; Abnormal glucose; SSS (sick sinus syndrome) ([place]); Anxiety; Insomnia; Recurrent major 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 ([place]); Malignant neoplasm of upper-outer quadrant of right breast in female, estrogen receptor positive ([place]); and Invasive ductal carcinoma of breast, female, right ([place]) on their problem list.   Observations/Objective:   BP 116/72   Pulse 70   Temp (!) 97 F (36.1 C)   Resp 16   Ht 5\' 4"  (1.626 m)   Wt 143 lb 9.6 oz (65.1 kg)   SpO2 97%   BMI 24.65 kg/m   HEENT - WNL. Neck - supple.  Chest - Clear equal BS. Cor - Nl HS. RRR w/o sig MGR. PP 1(+).  No edema. Abd - Soft w/o masses or tenderness.  MS- FROM w/o deformities.  Gait Nl. Neuro -  Nl w/o focal abnormalities.  Assessment and Plan:  1. Dysuria  - Urinalysis, Routine w reflex microscopic - Urine Culture  - MACROBID) 100 MG capsule; Take 1 cap 2 x /day  Pending results of U/C  2. Stress incontinence  3. Bladder spasms  4. Primary insomnia  - hydrOXYzine 50 MG ; Take 1 to 2 tabs 1 hr before Bedtime if needed for Sleep  Disp: 30  tab   Follow Up Instructions:       I discussed the assessment and treatment plan with the patient. The patient was provided an opportunity to ask questions and all were answered. The patient agreed with the plan and demonstrated an understanding of the instructions.       The patient was advised to call back or seek an in-person evaluation if the symptoms worsen or if the condition fails to improve as anticipated.   Kirtland Bouchard, MD

## 2020-02-19 NOTE — Patient Instructions (Signed)

## 2020-02-20 NOTE — Progress Notes (Signed)
Patient Care Team: [name], MD as PCP - General (Internal Medicine) [name], [name], MD as PCP - Cardiology (Cardiology) [name], MD as Consulting Physician (Urology) [name], [name], MD as Consulting Physician (Neurology) [name], MD as Consulting Physician (Neurology) [name], [name], MD as Consulting Physician (Cardiology) [name], MD (Inactive) as Consulting Physician (Gastroenterology) [name], RN as Oncology Nurse Navigator [name], RN as Oncology Nurse Navigator  DIAGNOSIS:    ICD-10-CM   1. Malignant neoplasm of upper-outer quadrant of right breast in female, estrogen receptor positive ([place])  C50.411    Z17.0     SUMMARY OF ONCOLOGIC HISTORY: Oncology History  Malignant neoplasm of upper-outer quadrant of right breast in female, estrogen receptor positive ([place])  11/15/2019 Cancer Staging   Staging form: Breast, AJCC 8th Edition - Clinical stage from 11/15/2019: Stage IA (cT1c, cN0, cM0, G2, ER+, PR+, HER2-) - Signed by [name], NP on 11/21/2019   11/15/2019 Initial Biopsy   Palpable right breast mass: 1.5 cm at 12 o'clock position, benign intramammary lymph node, axilla negative, right breast biopsy 1 grade 2 ER 80%, PR 50%, Ki-67 5%, HER-2 negative   12/13/2019 Surgery   Right lumpectomy ([facility]): invasive lobular carcinoma, grade 2, 2.4cm, clear margins.      CHIEF COMPLIANT: Follow-up of right breast cancer on anastrozole   INTERVAL HISTORY: Misty Blackwell is a 86 y.o. with above-mentioned history of right breast cancer who underwent a right lumpectomy and is currently on antiestrogen therapy with anastrozole. She presents to the clinic today for follow-up.   ALLERGIES:  is allergic to latex, ace inhibitors, augmentin [amoxicillin-pot clavulanate], ciprofloxacin, levaquin [levofloxacin in d5w], zocor [simvastatin], acrylic polymer [carbomer], chocolate, and gabapentin.  MEDICATIONS:  Current  Outpatient Medications  Medication Sig Dispense Refill  . acidophilus (RISAQUAD) CAPS capsule Take 1 capsule by mouth daily.    Marland Kitchen anastrozole (ARIMIDEX) 1 MG tablet Take 1 tablet (1 mg total) by mouth daily. 90 tablet 3  . Ascorbic Acid (VITAMIN C) 1000 MG tablet Take 1,000 mg by mouth daily.     . Cholecalciferol (VITAMIN D) 125 MCG (5000 UT) CAPS Take 5,000 Units by mouth daily.    . Cyanocobalamin (VITAMIN B 12 PO) Take 1,000 mcg by mouth daily.     Marland Kitchen diltiazem (CARDIZEM CD) 180 MG 24 hr capsule TAKE 1 CAPSULE BY MOUTH EVERY DAY (Patient taking differently: Take 180 mg by mouth daily. ) 90 capsule 2  . ferrous fumarate (FERRO-SEQUELS) 18 MG CR tablet Take 18 mg by mouth daily.    . furosemide (LASIX) 40 MG tablet Take 1 tablet (40 mg total) by mouth daily. (Patient taking differently: Take 40 mg by mouth daily as needed for edema. ) 90 tablet 3  . hydrOXYzine (ATARAX/VISTARIL) 50 MG tablet Take      1 to 2 tablets     1 hour before Bedtime       if needed for Sleep 30 tablet 0  . Magnesium 250 MG TABS Take 250 mg by mouth daily.    . nitrofurantoin, macrocrystal-monohydrate, (MACROBID) 100 MG capsule Take 1 capsule    2 x /day        with Food for Bladder Infection 14 capsule 0  . nitroGLYCERIN (NITROSTAT) 0.4 MG SL tablet Place 0.4 mg under the tongue every 5 (five) minutes as needed for chest pain.     . potassium chloride SA (KLOR-CON) 20 MEQ tablet Take 1 tablet daily for potassium  replacement. (Patient taking differently: Take 20 mEq by mouth daily. ) 90 tablet 3  . telmisartan (MICARDIS) 80 MG tablet Take 1 tablet (80 mg total) by mouth daily. 90 tablet 3  . warfarin (COUMADIN) 3 MG tablet TAKE 1 TO 1 AND 1/2 TABLETS BY MOUTH AS DIRECTED 135 tablet 1  . zinc gluconate 50 MG tablet Take 50 mg by mouth daily.     No current facility-administered medications for this visit.    PHYSICAL EXAMINATION: ECOG PERFORMANCE STATUS: 1 - Symptomatic but completely ambulatory  Vitals:   02/21/20  1500  BP: (!) 167/74  Pulse: 85  Resp: 17  Temp: 99.1 F (37.3 C)  SpO2: 95%   Filed Weights   02/21/20 1500  Weight: 141 lb 8 oz (64.2 kg)      LABORATORY DATA:  I have reviewed the data as listed CMP Latest Ref Rng & Units 01/08/2020 12/14/2019 12/06/2019  Glucose 65 - 99 mg/dL 98 157(H) 97  BUN 7 - 25 mg/dL 19 16 20   Creatinine 0.60 - 0.88 mg/dL 0.82 0.80 0.73  Sodium 135 - 146 mmol/L 157(H) 140 140  Potassium 3.5 - 5.3 mmol/L 5.3 5.1 4.4  Chloride 98 - 110 mmol/L 113(H) 103 103  CO2 20 - 32 mmol/L 24 27 28   Calcium 8.6 - 10.4 mg/dL 10.1 9.5 9.0  Total Protein 6.1 - 8.1 g/dL 7.5 - -  Total Bilirubin 0.2 - 1.2 mg/dL 0.4 - -  Alkaline Phos 38 - 126 U/L - - -  AST 10 - 35 U/L 15 - -  ALT 6 - 29 U/L 10 - -    Lab Results  Component Value Date   WBC 7.5 01/08/2020   HGB 13.2 01/08/2020   HCT 40.8 01/08/2020   MCV 91.9 01/08/2020   PLT 255 01/08/2020   NEUTROABS 4,005 01/08/2020    ASSESSMENT & PLAN:  Malignant neoplasm of upper-outer quadrant of right breast in female, estrogen receptor positive ([place]) 12/13/19: Right lumpectomy ([facility]): invasive lobular carcinoma, grade 2, 2.4cm, clear margins.  ER 80%, PR 50%, HER-2 negative, Ki-67 5%  Treatment plan: Anastrozole adjuvant therapy 1 mg daily x5 years Anastrozole toxicities: Hot flashes have resolved. Otherwise tolerating it extremely well.  She is very ecstatic about her recent trip to [place] where she met pastor Dr. Darlyn Chamber.  She is a huge fan of his teachings.  Breast cancer surveillance: 1.  Breast exam 02/21/2020: Benign 2. Mammogram will need to be done in July 2022.  Return to clinic in 1 year for follow-up    No orders of the defined types were placed in this encounter.  The patient has a good understanding of the overall plan. she agrees with it. she will call with any problems that may develop before the next visit here.  Total time spent: 20 mins including face to face time and time spent for  planning, charting and coordination of care  [name], MD 02/21/2020  I, Cloyde Reams Dorshimer, am acting as scribe for Dr. [name].  I have reviewed the above documentation for accuracy and completeness, and I agree with the above.

## 2020-02-21 ENCOUNTER — Other Ambulatory Visit: Payer: Self-pay

## 2020-02-21 ENCOUNTER — Other Ambulatory Visit: Payer: Self-pay | Admitting: Internal Medicine

## 2020-02-21 ENCOUNTER — Inpatient Hospital Stay: Payer: Medicare Other | Attending: Hematology and Oncology | Admitting: Hematology and Oncology

## 2020-02-21 DIAGNOSIS — Z79811 Long term (current) use of aromatase inhibitors: Secondary | ICD-10-CM | POA: Diagnosis not present

## 2020-02-21 DIAGNOSIS — Z170 Estrogen receptor positive status [ER+]: Secondary | ICD-10-CM

## 2020-02-21 DIAGNOSIS — C50411 Malignant neoplasm of upper-outer quadrant of right female breast: Secondary | ICD-10-CM | POA: Diagnosis not present

## 2020-02-21 DIAGNOSIS — N3000 Acute cystitis without hematuria: Secondary | ICD-10-CM

## 2020-02-21 LAB — URINALYSIS, ROUTINE W REFLEX MICROSCOPIC
Bilirubin Urine: NEGATIVE
Glucose, UA: NEGATIVE
Hyaline Cast: NONE SEEN /LPF
Nitrite: NEGATIVE
Specific Gravity, Urine: 1.024 (ref 1.001–1.03)
Squamous Epithelial / HPF: NONE SEEN /HPF (ref <=5)
pH: 5.5 (ref 5.0–8.0)

## 2020-02-21 LAB — URINE CULTURE
MICRO NUMBER:: 11090910
SPECIMEN QUALITY:: ADEQUATE

## 2020-02-21 MED ORDER — SULFAMETHOXAZOLE-TRIMETHOPRIM 800-160 MG PO TABS: ORAL_TABLET | ORAL | 0 refills | Status: DC

## 2020-02-21 NOTE — Assessment & Plan Note (Signed)
12/13/19: Right lumpectomy ([facility]): invasive lobular carcinoma, grade 2, 2.4cm, clear margins.  ER 80%, PR 50%, HER-2 negative, Ki-67 5%  Treatment plan: Anastrozole adjuvant therapy 1 mg daily x5 years Anastrozole toxicities: Occasional hot flashes but otherwise tolerating it extremely well.  Breast cancer surveillance: 1.  Breast exam 02/21/2020: Benign 2. Mammogram will need to be done in July 2022.  Return to clinic in 1 year for follow-up 

## 2020-02-21 NOTE — Progress Notes (Signed)
========================================================== ==========================================================  -    U/C shows a UTI  from E.coli bacteria  - Rx sent for Sulfa Abx for  10 days   - Recommend cut  Warfarin dose in 1/2 while on Sulfa Abx  - Please schedule a NV for U/A & C/S  betw Nov 15 to Dec 3rd ==========================================================

## 2020-02-21 NOTE — Progress Notes (Signed)
========================================================== ==========================================================  -    U/C shows a UTI  from E.coli bacteria  - Please schedule a NV for U/A & C//s betw Nov 15 to Dec 3rd ==========================================================

## 2020-02-22 ENCOUNTER — Ambulatory Visit (INDEPENDENT_AMBULATORY_CARE_PROVIDER_SITE_OTHER): Payer: Medicare Other | Admitting: Pharmacist Clinician (PhC)/ Clinical Pharmacy Specialist

## 2020-02-22 DIAGNOSIS — Z7901 Long term (current) use of anticoagulants: Secondary | ICD-10-CM | POA: Diagnosis not present

## 2020-02-22 DIAGNOSIS — I482 Chronic atrial fibrillation, unspecified: Secondary | ICD-10-CM | POA: Diagnosis not present

## 2020-02-22 LAB — POCT INR: INR: 2.1 (ref 2.0–3.0)

## 2020-02-25 ENCOUNTER — Telehealth: Payer: Self-pay | Admitting: Hematology and Oncology

## 2020-02-25 NOTE — Telephone Encounter (Signed)
Pt will receive an updated appt calendar per next visit appt notes

## 2020-02-26 ENCOUNTER — Other Ambulatory Visit: Payer: Self-pay

## 2020-02-28 ENCOUNTER — Ambulatory Visit: Payer: Medicare Other | Admitting: General Practice

## 2020-02-28 ENCOUNTER — Telehealth: Payer: Self-pay

## 2020-02-28 ENCOUNTER — Telehealth: Payer: Self-pay | Admitting: Cardiovascular Disease

## 2020-02-28 NOTE — Telephone Encounter (Signed)
Pt c/o Shortness Of Breath: STAT if SOB developed within the last 24 hours or pt is noticeably SOB on the phone  1. Are you currently SOB (can you hear that pt is SOB on the phone)? no  2. How long have you been experiencing SOB? 3-4 weeks  3. Are you SOB when sitting or when up moving around? Moving around  4. Are you currently experiencing any other symptoms? No, felt sick once but has not felt that way since.   Patient states she has been very SOB for the past 3-4 weeks. She states it happens when she is moving around and does not have it all the time. She states she just does not feel right and last night while turning over in bed she felt "lifeless". Please advise.

## 2020-02-28 NOTE — Telephone Encounter (Signed)
Called patient back about her message. Patient complaining of SOB with activity for the last several weeks. Patient denies any chest pain or edema. Patient stated her SOB gets better when resting. Patient has been vaccinated and is suppose to get her booster shot in November. Patient stated she does have indigestion at times, but it goes away. Patient stated she just does not feel right and she would feel better if she was checked out. Scheduled patient to see Coletta Memos NP this afternoon to be evaluated. Patient agreed to plan.

## 2020-02-28 NOTE — Telephone Encounter (Signed)
CON'T Also, pt weight today w/clothes is 139

## 2020-02-28 NOTE — Telephone Encounter (Signed)
CALLED PT-she will take lasix and come in next week for eval. She will weigh daily and make a log to bring to her appt. She will call for wt >3# daily

## 2020-03-04 ENCOUNTER — Other Ambulatory Visit: Payer: Self-pay | Admitting: Physician Assistant

## 2020-03-04 ENCOUNTER — Telehealth: Payer: Self-pay | Admitting: Cardiovascular Disease

## 2020-03-04 ENCOUNTER — Telehealth: Payer: Self-pay | Admitting: Physician Assistant

## 2020-03-04 DIAGNOSIS — I5032 Chronic diastolic (congestive) heart failure: Secondary | ICD-10-CM

## 2020-03-04 DIAGNOSIS — R0601 Orthopnea: Secondary | ICD-10-CM

## 2020-03-04 DIAGNOSIS — R0602 Shortness of breath: Secondary | ICD-10-CM

## 2020-03-04 DIAGNOSIS — Z79899 Other long term (current) drug therapy: Secondary | ICD-10-CM

## 2020-03-04 NOTE — Telephone Encounter (Signed)
Will reach out to team regarding availability.

## 2020-03-04 NOTE — Telephone Encounter (Signed)
Called the patient but went to voice mail. Left voice mail to call back.

## 2020-03-04 NOTE — Telephone Encounter (Signed)
Called and spoke with the patient and niece.  Patient reported improved breathing since taking Lasix 40 mg daily.  Denies lower extremity edema.  Her breathing and orthopnea has been improved significantly, but not at baseline yet.  Her weight has been stable since taking Lasix at 139 pounds.  Compliant with low-sodium diet.  Explained reason of cancellation of appointment.  She will continue to take Lasix 40 mg daily for next 2 days and come for BMET and BNP on Tuesday.  Lasix dose based on result.  If worsening symptoms he will reach out to PCP for further evaluation.

## 2020-03-04 NOTE — Telephone Encounter (Signed)
New Message   Spoke to patients niece today to cancel pts appt with Coletta Memos due to him being in Triage on 11/04, there are no other appts available at this time.. Niece says pt has been on Double Lasix for 7 days. She has lost no weight, she says she is breathing a little bit better but still has SOB and starting coughing yesterday (Monday).  She is wondering if she can get working in somewhere, she said she can bring her at anytime, she took the day off on 11/04 to bring her   Please advise

## 2020-03-04 NOTE — Addendum Note (Signed)
Addended by: Dominga Ferry on: 03/04/2020 02:44 PM   Modules accepted: Orders

## 2020-03-04 NOTE — Telephone Encounter (Signed)
Patient's niece, [name], is returning call.

## 2020-03-04 NOTE — Telephone Encounter (Signed)
error 

## 2020-03-06 ENCOUNTER — Ambulatory Visit: Payer: Medicare Other | Admitting: General Practice

## 2020-03-07 ENCOUNTER — Telehealth: Payer: Self-pay

## 2020-03-07 LAB — BASIC METABOLIC PANEL
BUN/Creatinine Ratio: 29 — ABNORMAL HIGH (ref 12–28)
BUN: 21 mg/dL (ref 10–36)
CO2: 26 mmol/L (ref 20–29)
Calcium: 9 mg/dL (ref 8.7–10.3)
Chloride: 105 mmol/L (ref 96–106)
Creatinine, Ser: 0.72 mg/dL (ref 0.57–1.00)
GFR calc Af Amer: 85 mL/min/{1.73_m2} (ref >59)
GFR calc non Af Amer: 74 mL/min/{1.73_m2} (ref >59)
Glucose: 87 mg/dL (ref 65–99)
Potassium: 4.4 mmol/L (ref 3.5–5.2)
Sodium: 146 mmol/L — ABNORMAL HIGH (ref 134–144)

## 2020-03-07 LAB — BRAIN NATRIURETIC PEPTIDE: BNP: 144.8 pg/mL — ABNORMAL HIGH (ref 0.0–100.0)

## 2020-03-07 MED ORDER — FUROSEMIDE 20 MG PO TABS
20.0000 mg | ORAL_TABLET | Freq: Every day | ORAL | 11 refills | Status: DC
Start: 2020-03-07 — End: 2021-05-14

## 2020-03-07 NOTE — Telephone Encounter (Signed)
Spoke with patient about result note. Appointment made for 12/9 with Dr. Loletha Grayer.  Lasix changed to 20mg  daily.  Patient verbalized understanding.

## 2020-03-12 ENCOUNTER — Encounter: Payer: Medicare Other | Admitting: Internal Medicine

## 2020-03-17 ENCOUNTER — Other Ambulatory Visit: Payer: Self-pay | Admitting: Internal Medicine

## 2020-03-17 DIAGNOSIS — F5101 Primary insomnia: Secondary | ICD-10-CM

## 2020-04-04 ENCOUNTER — Other Ambulatory Visit: Payer: Self-pay

## 2020-04-04 ENCOUNTER — Ambulatory Visit (INDEPENDENT_AMBULATORY_CARE_PROVIDER_SITE_OTHER): Payer: Medicare Other

## 2020-04-04 DIAGNOSIS — I482 Chronic atrial fibrillation, unspecified: Secondary | ICD-10-CM

## 2020-04-04 DIAGNOSIS — Z5181 Encounter for therapeutic drug level monitoring: Secondary | ICD-10-CM | POA: Diagnosis not present

## 2020-04-04 DIAGNOSIS — Z7901 Long term (current) use of anticoagulants: Secondary | ICD-10-CM

## 2020-04-04 LAB — POCT INR: INR: 2.6 (ref 2.0–3.0)

## 2020-04-04 NOTE — Patient Instructions (Signed)
Continue taking 1.5 tablets daily except 1 tablet Sunday, Tuesday and Thursday. Repeat INR 6 weeks. Call if you have questions [phone] (Kristin/Raquel)

## 2020-04-05 ENCOUNTER — Encounter (HOSPITAL_BASED_OUTPATIENT_CLINIC_OR_DEPARTMENT_OTHER): Payer: Self-pay | Admitting: Emergency Medicine

## 2020-04-05 ENCOUNTER — Emergency Department (HOSPITAL_BASED_OUTPATIENT_CLINIC_OR_DEPARTMENT_OTHER)
Admission: EM | Admit: 2020-04-05 | Discharge: 2020-04-05 | Disposition: A | Discharge status: Home / Self Care | Payer: Medicare Other | Attending: Emergency Medicine | Admitting: Emergency Medicine

## 2020-04-05 ENCOUNTER — Emergency Department (HOSPITAL_BASED_OUTPATIENT_CLINIC_OR_DEPARTMENT_OTHER): Payer: Medicare Other

## 2020-04-05 ENCOUNTER — Other Ambulatory Visit: Payer: Self-pay

## 2020-04-05 DIAGNOSIS — I2510 Atherosclerotic heart disease of native coronary artery without angina pectoris: Secondary | ICD-10-CM | POA: Diagnosis not present

## 2020-04-05 DIAGNOSIS — Z91040 Latex allergy status: Secondary | ICD-10-CM | POA: Insufficient documentation

## 2020-04-05 DIAGNOSIS — W19XXXA Unspecified fall, initial encounter: Secondary | ICD-10-CM

## 2020-04-05 DIAGNOSIS — S0990XA Unspecified injury of head, initial encounter: Secondary | ICD-10-CM | POA: Diagnosis not present

## 2020-04-05 DIAGNOSIS — I110 Hypertensive heart disease with heart failure: Secondary | ICD-10-CM | POA: Insufficient documentation

## 2020-04-05 DIAGNOSIS — W01198A Fall on same level from slipping, tripping and stumbling with subsequent striking against other object, initial encounter: Secondary | ICD-10-CM | POA: Diagnosis not present

## 2020-04-05 DIAGNOSIS — Z7901 Long term (current) use of anticoagulants: Secondary | ICD-10-CM | POA: Diagnosis not present

## 2020-04-05 DIAGNOSIS — Z79899 Other long term (current) drug therapy: Secondary | ICD-10-CM | POA: Insufficient documentation

## 2020-04-05 DIAGNOSIS — Z853 Personal history of malignant neoplasm of breast: Secondary | ICD-10-CM | POA: Insufficient documentation

## 2020-04-05 DIAGNOSIS — I5032 Chronic diastolic (congestive) heart failure: Secondary | ICD-10-CM | POA: Diagnosis not present

## 2020-04-05 NOTE — ED Provider Notes (Signed)
[facility] Provider Note   CSN: [id number] Arrival date & time: 04/05/20  2008     History Chief Complaint  Patient presents with  . Fall    Misty Blackwell is a 86 y.o. female.  HPI     Misty Blackwell is a 86 y.o. female, with a history of A. fib, anxiety, CHF, HTN, pacemaker, anticoagulation, presenting to the ED for evaluation following a fall that occurred around 7 AM this evening. Patient states she was walking to her kitchen, turned around quickly, lost her balance, and fell backward, striking the back of her head. Her only current complaint is pain to the back of the head, throbbing, moderate, nonradiating. She states she initially felt nauseous, out of sorts, and with a little shortness of breath after the fall, however, these symptoms have resolved.  She is anticoagulated on Coumadin.    Denies LOC, dizziness, numbness, weakness, chest pain, abdominal pain, extremity pain, back pain, vomiting, or any other complaints.   Past Medical History:  Diagnosis Date  . Anxiety   . Atrial fib/flutter, transient   . Atrial fibrillation, chronic ([place])   . CHF (congestive heart failure) ([place]) 10/29/2009   Echo - EF >55%; normal LV size and systolic function; unable to assess diastolic fcn due to E/A fusion, pulmonary vein flow pattern suggests elevated filling pressure; marked biatrail dilation, mild/mod tricuspid regurgitation; mod pulmonary htn; mild/mod mitral regurgitation; although echocardiographic features are incomplete findings suggest possible infiltrative cardiomyopathy (maybe amyloidosi  . Coronary artery disease 03/19/2002   R/P Cardiolite - EF 76%; nromal static and dynamic myocardial perfusion images; normal wall motion and endocardial thickening in all vascular territories  . Dyspnea   . Dysrhythmia   . Facial numbness 12/26/2008   carotid doppler - R and L ICAs 0-49% diameter reduction (velocities suggest low end of scale)   . Hypertension   . Pacemaker   . Peripheral neuropathy   . Pneumonia 2017  . Skin cancer    s/p surgical removal.  . Stroke ([place]) 04/2017  . TIA (transient ischemic attack)     Patient Active Problem List   Diagnosis Date Noted  . Invasive ductal carcinoma of breast, female, right ([place]) 12/13/2019  . Malignant neoplasm of upper-outer quadrant of right breast in female, estrogen receptor positive ([place]) 11/21/2019  . Anxiety 06/18/2019  . Insomnia 06/18/2019  . Recurrent major 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 ([place]) 06/18/2019  . SSS (sick sinus syndrome) ([place]) 09/26/2018  . Abnormal glucose 08/29/2018  . Neural foraminal stenosis of cervical spine 04/18/2018  . History of TIA (transient ischemic attack) 04/17/2018  . Chronic atrial fibrillation ([place])   . Overweight (BMI 25.0-29.9) 11/21/2017  . Chronic diastolic heart failure ([place]) 11/10/2017  . Vitamin D deficiency 08/15/2013  . Medication management 08/15/2013  . Pacemaker 09/19/2012  . Long term current use of anticoagulant therapy 07/18/2012  . Hyperlipidemia, mixed 09/03/2008  . Essential hypertension 09/03/2008  . Coronary atherosclerosis 09/03/2008  . GERD 09/03/2008  . FIBROCYSTIC BREAST DISEASE 09/03/2008  . Osteoarthritis 09/03/2008    Past Surgical History:  Procedure Laterality Date  . ABDOMINAL HYSTERECTOMY    . APPENDECTOMY    . BREAST LUMPECTOMY WITH RADIOACTIVE SEED LOCALIZATION Right 12/13/2019   Procedure: RIGHT BREAST LUMPECTOMY WITH RADIOACTIVE SEED LOCALIZATION;  Surgeon: Donnie Mesa, MD;  Location: [facility];  Service: General;  Laterality: Right;  LMA VS MAC  . BREAST SURGERY Left 1949  . CARDIAC CATHETERIZATION  08/06/2005   minimal coronary  disease predominant RCA; no significant atherosclerosis; new onset sick sinus syndrome and atrial flutter w/ ventricular response, controlled on med therapy; systemic HTN, normal renal arteries  . CARDIOVERSION  11/19/2009   successful DCCV from AF to sinus type  rhythm  . CHOLECYSTECTOMY    . EYE SURGERY Bilateral 2013  . Skin cancer resection    . TONSILLECTOMY  1938     OB History   No obstetric history on file.     Family History  Problem Relation Age of Onset  . Heart disease Mother   . Diabetes Mother   . Heart attack Father   . Heart disease Father   . Cirrhosis Brother     Social History   Tobacco Use  . Smoking status: Never Smoker  . Smokeless tobacco: Never Used  Vaping Use  . Vaping Use: Never used  Substance Use Topics  . Alcohol use: No  . Drug use: No    Home Medications Prior to Admission medications   Medication Sig Start Date End Date Taking? Authorizing Provider  traMADol (ULTRAM) 50 MG tablet Take by mouth every 6 (six) hours as needed.   Yes [provider]  acidophilus (RISAQUAD) CAPS capsule Take 1 capsule by mouth daily.    [provider]  anastrozole (ARIMIDEX) 1 MG tablet Take 1 tablet (1 mg total) by mouth daily. 11/21/19   [name], MD  Ascorbic Acid (VITAMIN C) 1000 MG tablet Take 1,000 mg by mouth daily.     [provider]  Cholecalciferol (VITAMIN D) 125 MCG (5000 UT) CAPS Take 5,000 Units by mouth daily.    [provider]  Cyanocobalamin (VITAMIN B 12 PO) Take 1,000 mcg by mouth daily.     [provider]  diltiazem (CARDIZEM CD) 180 MG 24 hr capsule TAKE 1 CAPSULE BY MOUTH EVERY DAY Patient taking differently: Take 180 mg by mouth daily.  08/02/19   [name], MD  ferrous fumarate (FERRO-SEQUELS) 18 MG CR tablet Take 18 mg by mouth daily.    [provider]  furosemide (LASIX) 20 MG tablet Take 1 tablet (20 mg total) by mouth daily. 03/07/20   Leanor Kail, PA  hydrOXYzine (ATARAX/VISTARIL) 50 MG tablet Take     1 to 2 tablets     1 hour before Bedtime        as needed for Sleep 03/17/20   [name], MD  Magnesium 250 MG TABS Take 250 mg by mouth daily.    [provider]  nitrofurantoin,  macrocrystal-monohydrate, (MACROBID) 100 MG capsule Take 1 capsule    2 x /day        with Food for Bladder Infection 02/19/20   [name], MD  potassium chloride SA (KLOR-CON) 20 MEQ tablet Take 1 tablet daily for potassium replacement. Patient taking differently: Take 20 mEq by mouth daily.  08/02/19   [name], MD  sulfamethoxazole-trimethoprim (BACTRIM DS) 800-160 MG tablet Take 1 tablet 2 x /day with Meals for Infection 02/21/20   [name], MD  telmisartan (MICARDIS) 80 MG tablet Take 1 tablet (80 mg total) by mouth daily. 11/19/19   [name], Mihai, MD  warfarin (COUMADIN) 3 MG tablet TAKE 1 TO 1 AND 1/2 TABLETS BY MOUTH AS DIRECTED 02/01/20   [name], MD  zinc gluconate 50 MG tablet Take 50 mg by mouth daily.    [provider]    Allergies    Latex, Ace inhibitors, Augmentin [amoxicillin-pot clavulanate], Ciprofloxacin, Levaquin [levofloxacin in  d5w], Zocor [simvastatin], Acrylic polymer [carbomer], Chocolate, and Gabapentin  Review of Systems   Review of Systems  Constitutional: Negative for diaphoresis.  Respiratory: Negative for shortness of breath (none currently).   Cardiovascular: Negative for chest pain.  Gastrointestinal: Negative for abdominal pain, nausea (none currently) and vomiting.  Musculoskeletal: Negative for arthralgias and back pain.  Neurological: Negative for dizziness, syncope, weakness and numbness.  All other systems reviewed and are negative.   Physical Exam Updated Vital Signs BP (!) 195/85 (BP Location: Left Arm)   Pulse 71   Temp 98.3 F (36.8 C) (Oral)   Resp 18   Ht 5' 4.5" (1.638 m)   Wt 63.5 kg   SpO2 100%   BMI 23.66 kg/m   Physical Exam Vitals and nursing note reviewed.  Constitutional:      General: She is not in acute distress.    Appearance: She is well-developed. She is not diaphoretic.  HENT:     Head: Normocephalic.     Comments: Abrasion to the midline occipital scalp with some swelling.   No noted deformity or instability. The rest of the scalp and face were examined without evidence of injury.    Mouth/Throat:     Mouth: Mucous membranes are moist.     Pharynx: Oropharynx is clear.  Eyes:     Conjunctiva/sclera: Conjunctivae normal.  Cardiovascular:     Rate and Rhythm: Normal rate and regular rhythm.     Pulses: Normal pulses.          Radial pulses are 2+ on the right side and 2+ on the left side.       Posterior tibial pulses are 2+ on the right side and 2+ on the left side.     Heart sounds: Normal heart sounds.     Comments: Tactile temperature in the extremities appropriate and equal bilaterally. Pulmonary:     Effort: Pulmonary effort is normal. No respiratory distress.     Breath sounds: Normal breath sounds.     Comments: No increased work of breathing.  Speaks in full sentences without difficulty. Chest:     Chest wall: No tenderness.  Abdominal:     Palpations: Abdomen is soft.     Tenderness: There is no abdominal tenderness. There is no guarding.  Musculoskeletal:     Cervical back: Neck supple.     Right lower leg: No edema.     Left lower leg: No edema.     Comments: Normal motor function intact in all extremities. No midline spinal tenderness.   Each of the major joints of the upper and lower extremities were palpated, examined, and ranged without painful range of motion or other signs of injury.  Overall trauma exam performed without any abnormalities noted other than those mentioned.  Skin:    General: Skin is warm and dry.  Neurological:     Mental Status: She is alert and oriented to person, place, and time.     Comments: No noted acute cognitive deficit. Sensation grossly intact to light touch in the extremities.   Grip strengths equal bilaterally.   Strength 5/5 in all extremities.  No gait disturbance. Coordination intact.  Cranial nerves III-XII grossly intact.  Handles oral secretions without noted difficulty.  No noted phonation or  speech deficit. No facial droop.   Psychiatric:        Mood and Affect: Mood and affect normal.        Speech: Speech normal.  Behavior: Behavior normal.     ED Results / Procedures / Treatments   Labs (all labs ordered are listed, but only abnormal results are displayed) Labs Reviewed - No data to display  EKG EKG Interpretation  Date/Time:  Saturday April 05 2020 20:40:11 EST Ventricular Rate:  80 PR Interval:    QRS Duration: 90 QT Interval:  382 QTC Calculation: 449 R Axis:   19 Text Interpretation: Atrial fibrillation Probable LVH with secondary repol abnrm Confirmed by Isla Pence [phone]) on 04/05/2020 8:46:54 PM   Radiology DG Chest 2 View  Result Date: 04/05/2020 CLINICAL DATA:  Fall with shortness of breath EXAM: CHEST - 2 VIEW COMPARISON:  Radiograph 06/22/2019 FINDINGS: Chronically hyperinflated lungs with some coarsened interstitial changes and bronchitic features which are overall similar to comparison exam. No consolidation, features of edema, pneumothorax, or effusion. Stable biapical pleuroparenchymal scarring. Pacer pack overlies the left chest wall with leads at the cardiac apex and right atrium. The aorta is calcified. The remaining cardiomediastinal contours are unremarkable. No acute osseous or soft tissue abnormality. The osseous structures appear diffusely demineralized which may limit detection of small or nondisplaced fractures. Degenerative changes are present in the imaged spine and shoulders. Telemetry leads overlie the chest. IMPRESSION: 1. No acute cardiopulmonary or traumatic abnormalities. 2. Chronically hyperinflated lungs with coarsened interstitial changes and bronchitic features, overall similar to comparison. Electronically Signed   By: Lovena Le M.D.   On: 04/05/2020 21:23   CT Head Wo Contrast  Result Date: 04/05/2020 CLINICAL DATA:  [place] backwards with positive head strike EXAM: CT HEAD WITHOUT CONTRAST CT CERVICAL SPINE WITHOUT  CONTRAST TECHNIQUE: Multidetector CT imaging of the head and cervical spine was performed following the standard protocol without intravenous contrast. Multiplanar CT image reconstructions of the cervical spine were also generated. COMPARISON:  CT head 04/18/2018 FINDINGS: CT HEAD FINDINGS Brain: No evidence of acute infarction, hemorrhage, hydrocephalus, extra-axial collection, visible mass lesion or mass effect. Symmetric prominence of the ventricles, cisterns and sulci compatible with parenchymal volume loss. Patchy areas of white matter hypoattenuation are most compatible with chronic microvascular angiopathy. Vascular: Atherosclerotic calcification of the carotid siphons and basilar artery. No hyperdense vessel. Skull: Right parieto-occipital scalp swelling and scalp hematoma measuring up to 12 mm in maximal thickness without subjacent calvarial fracture or other acute osseous injury. No other significant sites of swelling. No suspicious osseous lesions. Sinuses/Orbits: Paranasal sinuses and mastoid air cells are predominantly clear. Orbital structures are unremarkable aside from prior lens extractions. Other: None. CT CERVICAL SPINE FINDINGS Alignment: Mild rightward lateral flexion and significant cranial rotation. Straightening of the normal cervical lordosis and slight reversal towards the upper cervical levels. No evidence of traumatic listhesis. No abnormally widened, perched or jumped facets. Grossly normal alignment of the craniocervical and atlantoaxial articulations accounting for cranial positioning. Skull base and vertebrae: No acute skull base fracture. No vertebral body fracture or height loss. No worrisome osseous lesions. The osseous structures appear diffusely demineralized which may limit detection of small or nondisplaced fractures. Moderate atlantodental and basion dens arthrosis. Cervical spondylitic changes are present as well as described below. Intervertebral nitrogenous gas lucencies  present as well. Soft tissues and spinal canal: No pre or paravertebral fluid or swelling. No visible canal hematoma. Disc levels: Multilevel intervertebral disc height loss with spondylitic endplate changes. Features are most pronounced C4-5 with larger posterior spurring and disc osteophyte complex resulting in some moderate canal narrowing. Additional small posterior disc osteophyte complexes present C5-6, C6-7 but without other significant canal  impingement. Multilevel uncinate spurring and facet hypertrophic changes are present. Moderate to severe bilateral narrowing C4-5 with more mild multilevel narrowing at the remaining levels. Upper chest: No acute abnormality in the upper chest or imaged lung apices. Biapical pleuroparenchymal scarring. Other: Cervical carotid atherosclerosis.  Normal thyroid. IMPRESSION: 1. No acute intracranial abnormality. 2. Right parieto-occipital scalp swelling and scalp hematoma without subjacent calvarial fracture or other acute osseous injury. 3. Chronic microvascular ischemic changes, parenchymal volume loss and intracranial atherosclerosis. 4. No acute fracture or traumatic listhesis of the cervical spine. 5. Cervical spondylitic and facet hypertrophic changes, as above. Electronically Signed   By: Lovena Le M.D.   On: 04/05/2020 21:21   CT Cervical Spine Wo Contrast  Result Date: 04/05/2020 CLINICAL DATA:  [place] backwards with positive head strike EXAM: CT HEAD WITHOUT CONTRAST CT CERVICAL SPINE WITHOUT CONTRAST TECHNIQUE: Multidetector CT imaging of the head and cervical spine was performed following the standard protocol without intravenous contrast. Multiplanar CT image reconstructions of the cervical spine were also generated. COMPARISON:  CT head 04/18/2018 FINDINGS: CT HEAD FINDINGS Brain: No evidence of acute infarction, hemorrhage, hydrocephalus, extra-axial collection, visible mass lesion or mass effect. Symmetric prominence of the ventricles, cisterns and sulci  compatible with parenchymal volume loss. Patchy areas of white matter hypoattenuation are most compatible with chronic microvascular angiopathy. Vascular: Atherosclerotic calcification of the carotid siphons and basilar artery. No hyperdense vessel. Skull: Right parieto-occipital scalp swelling and scalp hematoma measuring up to 12 mm in maximal thickness without subjacent calvarial fracture or other acute osseous injury. No other significant sites of swelling. No suspicious osseous lesions. Sinuses/Orbits: Paranasal sinuses and mastoid air cells are predominantly clear. Orbital structures are unremarkable aside from prior lens extractions. Other: None. CT CERVICAL SPINE FINDINGS Alignment: Mild rightward lateral flexion and significant cranial rotation. Straightening of the normal cervical lordosis and slight reversal towards the upper cervical levels. No evidence of traumatic listhesis. No abnormally widened, perched or jumped facets. Grossly normal alignment of the craniocervical and atlantoaxial articulations accounting for cranial positioning. Skull base and vertebrae: No acute skull base fracture. No vertebral body fracture or height loss. No worrisome osseous lesions. The osseous structures appear diffusely demineralized which may limit detection of small or nondisplaced fractures. Moderate atlantodental and basion dens arthrosis. Cervical spondylitic changes are present as well as described below. Intervertebral nitrogenous gas lucencies present as well. Soft tissues and spinal canal: No pre or paravertebral fluid or swelling. No visible canal hematoma. Disc levels: Multilevel intervertebral disc height loss with spondylitic endplate changes. Features are most pronounced C4-5 with larger posterior spurring and disc osteophyte complex resulting in some moderate canal narrowing. Additional small posterior disc osteophyte complexes present C5-6, C6-7 but without other significant canal impingement. Multilevel  uncinate spurring and facet hypertrophic changes are present. Moderate to severe bilateral narrowing C4-5 with more mild multilevel narrowing at the remaining levels. Upper chest: No acute abnormality in the upper chest or imaged lung apices. Biapical pleuroparenchymal scarring. Other: Cervical carotid atherosclerosis.  Normal thyroid. IMPRESSION: 1. No acute intracranial abnormality. 2. Right parieto-occipital scalp swelling and scalp hematoma without subjacent calvarial fracture or other acute osseous injury. 3. Chronic microvascular ischemic changes, parenchymal volume loss and intracranial atherosclerosis. 4. No acute fracture or traumatic listhesis of the cervical spine. 5. Cervical spondylitic and facet hypertrophic changes, as above. Electronically Signed   By: Lovena Le M.D.   On: 04/05/2020 21:21    Procedures Procedures (including critical care time)  Medications Ordered in ED Medications -  No data to display  ED Course  I have reviewed the triage vital signs and the nursing notes.  Pertinent labs & imaging results that were available during my care of the patient were reviewed by me and considered in my medical decision making (see chart for details).    MDM Rules/Calculators/A&P                          Patient presents for evaluation following a fall and head injury while anticoagulated. No focal neurologic deficits. I personally reviewed and interpreted the patient's imaging studies.  No acute abnormalities on imaging The patient was given instructions for home care as well as return precautions. Patient voices understanding of these instructions, accepts the plan, and is comfortable with discharge.   Findings and plan of care discussed with Isla Pence, MD. Dr. Gilford Raid personally evaluated and examined this patient.   Vitals:   04/05/20 2014 04/05/20 2015 04/05/20 2016 04/05/20 2106  BP:  (!) 195/85  (!) 178/86  Pulse:  71  69  Resp:  18  14  Temp:  98.3 F (36.8  C)    TempSrc:  Oral    SpO2: 100% 100%  98%  Weight:   63.5 kg   Height:   5' 4.5" (1.638 m)      Final Clinical Impression(s) / ED Diagnoses Final diagnoses:  Fall, initial encounter  Injury of head, initial encounter    Rx / DC Orders ED Discharge Orders    None       Layla Maw 04/05/20 2213    Isla Pence, MD 04/05/20 2350

## 2020-04-05 NOTE — ED Notes (Signed)
Signature pad not working. AVS reviewed with pt and pts visitor. Both verbalized understanding. EDP at bedside during discharge for answering questions as well.

## 2020-04-05 NOTE — Discharge Instructions (Signed)
    Head Injury You have been seen today for a head injury, which may or may not have resulted in a concussion. It does not appear to be serious at this time, however, it is important to note that your presentation today is not necessarily an indication of the severity of future symptoms.  Expected symptoms: Expected symptoms of concussion and/or head injury can include nausea, headache, mild dizziness (should still be able to get up and walk around without difficulty), difficulty concentrating, increased sleep, difficulty sleeping, increased intensity of emotions. Close observation: The close observation period is usually 6 hours from the injury. This includes staying awake and having a trustworthy adult monitor you to assure your condition does not worsen. You should be in regular contact with this person and ideally, they should be able to monitor you in person.  Secondary observation: The secondary observation period is usually 24 hours from the injury. You are allowed to sleep during this time. A trustworthy adult should intermittently monitor you to assure your condition does not worsen.   Overall head injury/concussion care: Rest: Be sure to get plenty of rest. You will need more rest and sleep while you recover. Hydration: Be sure to stay well hydrated by having a goal of drinking about 0.5 liters of water every couple hours. Pain:  Acetaminophen: May take acetaminophen (generic for Tylenol), as needed, for pain. Your daily total maximum amount of acetaminophen from all sources should be limited to 4000mg /day for persons without liver problems, or 2000mg /day for those with liver problems. Ice: May apply ice to reduce swelling and pain. Return to sports and activities: In general, you may return to normal activities once symptoms have subsided, however, you would ideally be cleared by a primary care provider or other qualified medical professional prior to return to these activities.  Follow  up: Follow up with the concussion clinic or your primary care provider for further management of this issue. Return: Return to the ED should you begin to have confusion, abnormal behavior, aggression, violence, or personality changes, repeated vomiting, vision loss, numbness or weakness on one side of the body, difficulty standing due to dizziness, significantly worsening pain, or any other major concerns.

## 2020-04-05 NOTE — ED Triage Notes (Signed)
Reports falling backwards hitting her head on the floor.  Reports she felt like she was gonna pass out but didn't.  Also reports feeling SOB.  Denies any hx of SOB.

## 2020-04-05 NOTE — ED Notes (Signed)
Pt ambulated well to bathroom with standby assist.

## 2020-04-08 ENCOUNTER — Encounter: Payer: Medicare Other | Admitting: Adult Health Nurse Practitioner

## 2020-04-09 ENCOUNTER — Encounter: Payer: Self-pay | Admitting: Internal Medicine

## 2020-04-09 NOTE — Patient Instructions (Signed)

## 2020-04-09 NOTE — Progress Notes (Signed)
Annual Screening/Preventative Visit & Comprehensive Evaluation &  Examination      This very nice 86 y.o.  [name]  presents for a Screening /Preventative Visit & comprehensive evaluation and management of multiple medical co-morbidities.  Patient has been followed for HTN, HLD, Prediabetes  and Vitamin D Deficiency. 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        HTN predates since 1998.  In 1993 and 2007 she had negative Heart Cath's showing Nonobstructive CAD. In 2006 she was discovered in Afib after an embolic TIA which recovered 100% w/o sequelae. She was started on coumadin which she continues monitored by [facility].  She had a PPM implanted in 2007 for SSS. Patient's BP has been controlled at home and patient denies any cardiac symptoms as chest pain, palpitations, shortness of breath, dizziness or ankle swelling. Today's BP is at goal  - 134/80.       Patient's hyperlipidemia is controlled with diet and medications. Patient denies myalgias or other medication SE's. Last lipids were at goal with slightly elevated Trigs:  Lab Results  Component Value Date   CHOL 139 09/25/2019   HDL 44 (L) 09/25/2019   LDLCALC 71 09/25/2019   TRIG 160 (H) 09/25/2019   CHOLHDL 3.2 09/25/2019        Patient has hx/o prediabetes withInsulin Resistance(A1c 5.9% /Insulin elev86 /2011)and patient denies reactive hypoglycemic symptoms, visual blurring, diabetic polys or paresthesias. Last A1c was Normal & at goal:  Lab Results  Component Value Date   HGBA1C 5.3 09/25/2019        Finally, patient has history of Vitamin D Deficiency and last Vitamin D was  Slightly  low (goal 70-100):  Lab Results  Component Value Date   VD25OH 50 09/25/2019    Current Outpatient Medications on File Prior to Visit  Medication Sig  . acidophilus (RISAQUAD) CAPS capsule Take 1 capsule by  mouth daily.  Marland Kitchen anastrozole (ARIMIDEX) 1 MG tablet Take 1 tablet daily.  . Ascorbic Acid (VITAMIN C) 1000 MG tablet Take 1,000 mg by mouth daily.   Marland Kitchen VITAMIN D 5,000 U Take 5,000 Units by mouth daily.  . Cyanocobalamin (VITAMIN B 12 PO) Take 1,000 mcg by mouth daily.   Marland Kitchen diltiazem CD 180 MG 24 hr  TAKE 1 CAPSULE EVERY DAY   . FERRO-SEQUELS 18 MG CR  Take 18 mg by mouth daily.  . furosemide (LASIX) 20 MG tablet Take 1 tablet (20 mg total) by mouth daily.  . hydrOXYzine 50 MG tablet Take     1 to 2 tablets     1 hour before Bedtime         . Magnesium 250 MG TABS Take 250 mg by mouth daily.  . potassium chloride SA  20 MEQ Take 1 tablet daily for potassium replacement.   Marland Kitchen telmisartan (MICARDIS) 80 MG tablet Take 1 tablet  daily.  . traMADol (ULTRAM) 50 MG tablet Take by mouth every 6 hours as needed.  . warfarin (COUMADIN) 3 MG tablet TAKE 1 TO 1 AND 1/2 TABs AS DIRECTED  . zinc gluconate 50 MG tablet Take 50 mg by mouth daily.     Allergies  Allergen Reactions  . Latex Itching  . Ace Inhibitors Other (See Comments)    Unknown reaction  . Augmentin [Amoxicillin-Pot Clavulanate] Other (See Comments)  . Ciprofloxacin  Other (See Comments)  . Levaquin [Levofloxacin In D5w] Other (See Comments)  . Zocor [Simvastatin] Other (See Comments)  . Acrylic Polymer [Carbomer] Itching  . Chocolate Other (See Comments)    migraine's   . Gabapentin Other (See Comments)    Unsteady gait     Past Medical History:  Diagnosis Date  . Anxiety   . Atrial fib/flutter, transient   . Atrial fibrillation, chronic ([place])   . CHF (congestive heart failure) ([place]) 10/29/2009   Echo - EF >55%; normal LV size and systolic function; unable to assess diastolic fcn due to E/A fusion, pulmonary vein flow pattern suggests elevated filling pressure; marked biatrail dilation, mild/mod tricuspid regurgitation; mod pulmonary htn; mild/mod mitral regurgitation; although echocardiographic features are incomplete findings  suggest possible infiltrative cardiomyopathy (maybe amyloidosi  . Coronary artery disease 03/19/2002   R/P Cardiolite - EF 76%; nromal static and dynamic myocardial perfusion images; normal wall motion and endocardial thickening in all vascular territories  . Dyspnea   . Dysrhythmia   . Facial numbness 12/26/2008   carotid doppler - R and L ICAs 0-49% diameter reduction (velocities suggest low end of scale)  . Hypertension   . Pacemaker   . Peripheral neuropathy   . Pneumonia 2017  . Skin cancer    s/p surgical removal.  . Stroke ([place]) 04/2017  . TIA (transient ischemic attack)    Health Maintenance  Topic Date Due  . DEXA SCAN  Never done  . TETANUS/TDAP  03/18/2024  . INFLUENZA VACCINE  Completed  . COVID-19 Vaccine  Completed  . PNA vac Low Risk Adult  Completed   Immunization History  Administered Date(s) Administered  . DT (Pediatric) 03/19/2015  . Influenza Split 05/04/2011  . Influenza, High Dose Seasonal PF 03/19/2014, 12/23/2015, 02/03/2017, 12/26/2018  . Influenza,inj,quad, With Preservative 05/21/2013  . Influenza-Unspecified 02/04/2015, 02/03/2017, 01/28/2020  . PFIZER SARS-COV-2 Vaccination 06/16/2019, 07/09/2019  . Pneumococcal Conjugate-13 03/19/2014  . Pneumococcal Polysaccharide-23 05/04/2011  . Pneumococcal-Unspecified 05/03/2001  . Td 05/04/2003    Last Colon - 09/10/2008 - no f/u due to age.  Last MGM - 12/12/2019   Past Surgical History:  Procedure Laterality Date  . ABDOMINAL HYSTERECTOMY    . APPENDECTOMY    . BREAST LUMPECTOMY WITH RADIOACTIVE SEED LOCALIZATION Right 12/13/2019   Procedure: RIGHT BREAST LUMPECTOMY WITH RADIOACTIVE SEED LOCALIZATION;  Surgeon: Donnie Mesa, MD;  Location: [place];  Service: General;  Laterality: Right;  LMA VS MAC  . BREAST SURGERY Left 1949  . CARDIAC CATHETERIZATION  08/06/2005   minimal coronary disease predominant RCA; no significant atherosclerosis; new onset sick sinus syndrome and atrial flutter w/  ventricular response, controlled on med therapy; systemic HTN, normal renal arteries  . CARDIOVERSION  11/19/2009   successful DCCV from AF to sinus type rhythm  . CHOLECYSTECTOMY    . EYE SURGERY Bilateral 2013  . Skin cancer resection    . TONSILLECTOMY  1938   Family History  Problem Relation Age of Onset  . Heart disease Mother   . Diabetes Mother   . Heart attack Father   . Heart disease Father   . Cirrhosis Brother    Social History   Tobacco Use  . Smoking status: Never Smoker  . Smokeless tobacco: Never Used  Vaping Use  . Vaping Use: Never used  Substance Use Topics  . Alcohol use: No  . Drug use: No    ROS Constitutional: Denies fever, chills, weight loss/gain, headaches, insomnia,  night sweats, and change in  appetite. Does c/o fatigue. Eyes: Denies redness, blurred vision, diplopia, discharge, itchy, watery eyes.  ENT: Denies discharge, congestion, post nasal drip, epistaxis, sore throat, earache, hearing loss, dental pain, Tinnitus, Vertigo, Sinus pain, snoring.  Cardio: Denies chest pain, palpitations, irregular heartbeat, syncope, dyspnea, diaphoresis, orthopnea, PND, claudication, edema Respiratory: denies cough, dyspnea, DOE, pleurisy, hoarseness, laryngitis, wheezing.  Gastrointestinal: Denies dysphagia, heartburn, reflux, water brash, pain, cramps, nausea, vomiting, bloating, diarrhea, constipation, hematemesis, melena, hematochezia, jaundice, hemorrhoids Genitourinary: Denies dysuria, frequency, urgency, nocturia, hesitancy, discharge, hematuria, flank pain Breast: Breast lumps, nipple discharge, bleeding.  Musculoskeletal: Denies arthralgia, myalgia, stiffness, Jt. Swelling, pain, limp, and strain/sprain. Denies falls. Skin: Denies puritis, rash, hives, warts, acne, eczema, changing in skin lesion Neuro: No weakness, tremor, incoordination, spasms, paresthesia, pain Psychiatric: Denies confusion, memory loss, sensory loss. Denies Depression. Endocrine:  Denies change in weight, skin, hair change, nocturia, and paresthesia, diabetic polys, visual blurring, hyper / hypo glycemic episodes.  Heme/Lymph: No excessive bleeding, bruising, enlarged lymph nodes.  Physical Exam  BP 134/80   Pulse 82   Temp (!) 97.2 F (36.2 C)   Resp 16   Ht _0  (1.626 m)   Wt 143 lb 6.4 oz (65 kg)   SpO2 96%   BMI 24.61 kg/m   General Appearance: Well nourished, well groomed and in no apparent distress.  Eyes: PERRLA, EOMs, conjunctiva no swelling or erythema, normal fundi and vessels. Sinuses: No frontal/maxillary tenderness ENT/Mouth: EACs patent / TMs  nl. Nares clear without erythema, swelling, mucoid exudates. Oral hygiene is good. No erythema, swelling, or exudate. Tongue normal, non-obstructing. Tonsils not swollen or erythematous. Hearing normal.  Neck: Supple, thyroid not palpable. No bruits, nodes or JVD. Respiratory: Respiratory effort normal.  BS equal and clear bilateral without rales, rhonci, wheezing or stridor. Cardio: Heart sounds are normal with regular rate and rhythm and no murmurs, rubs or gallops. Peripheral pulses are normal and equal bilaterally without edema. No aortic or femoral bruits. Chest: symmetric with normal excursions and percussion. Breasts: Symmetric, without lumps, nipple discharge, retractions, or fibrocystic changes.  Abdomen: Flat, soft with bowel sounds active. Nontender, no guarding, rebound, hernias, masses, or organomegaly.  Lymphatics: Non tender without lymphadenopathy.  Genitourinary:  Musculoskeletal: Full ROM all peripheral extremities, joint stability, 5/5 strength, and normal gait. Skin: Warm and dry without rashes, lesions, cyanosis, clubbing or  ecchymosis.  Neuro: Cranial nerves intact, reflexes equal bilaterally. Normal muscle tone, no cerebellar symptoms. Sensation intact.  Pysch: Alert and oriented X 3, normal affect, Insight and Judgment appropriate.   Assessment and Plan  1. Annual Preventative  Screening Examination   2. Essential hypertension  - EKG 12-Lead - [place], RETROPERITNL ABD,  LTD - Urinalysis, Routine w reflex microscopic - Microalbumin / creatinine urine ratio - CBC with Differential/Platelet - COMPLETE METABOLIC PANEL WITH GFR - Magnesium - TSH  3. Hyperlipidemia, mixed  - EKG 12-Lead - Lipid panel - TSH  4. Vitamin D deficiency  - VITAMIN D 25 Hydroxy   5. Abnormal glucose  - Hemoglobin A1c - Insulin, random  6. Atherosclerosis of native coronary artery of native heart   - Lipid panel  7. Chronic atrial fibrillation (HCC)  - EKG 12-Lead - TSH  8. Pacemaker  - EKG 12-Lead  9. Long term current use of anticoagulant therapy  - Microalbumin / creatinine urine ratio - POC Hemoccult Bld/Stl (3-Cd Home Screen); Future - CBC with Differential/Platelet  10. Acquired thrombophilia ([place])  - CBC with Differential/Platelet  11. Screening for ischemic heart disease  -  EKG 12-Lead  12. Medication management  - CBC with Differential/Platelet - COMPLETE METABOLIC PANEL WITH GFR - Magnesium - Lipid panel - TSH - Hemoglobin A1c - Insulin, random - VITAMIN D 25 Hydroxy          Patient was counseled in prudent diet to achieve/maintain BMI less than 25 for weight control, BP monitoring, regular exercise and medications. Discussed med's effects and SE's. Screening labs and tests as requested with regular follow-up as recommended. Over 40 minutes of exam, counseling, chart review and high complex critical decision making was performed.   Kirtland Bouchard, MD

## 2020-04-10 ENCOUNTER — Ambulatory Visit (INDEPENDENT_AMBULATORY_CARE_PROVIDER_SITE_OTHER): Payer: Medicare Other | Admitting: Internal Medicine

## 2020-04-10 ENCOUNTER — Ambulatory Visit (INDEPENDENT_AMBULATORY_CARE_PROVIDER_SITE_OTHER): Payer: Medicare Other | Admitting: Cardiovascular Disease

## 2020-04-10 ENCOUNTER — Other Ambulatory Visit: Payer: Self-pay

## 2020-04-10 ENCOUNTER — Encounter: Payer: Self-pay | Admitting: Cardiovascular Disease

## 2020-04-10 ENCOUNTER — Encounter: Payer: Self-pay | Admitting: Internal Medicine

## 2020-04-10 VITALS — BP 134/80 | HR 82 | Temp 97.2°F | Resp 16 | Ht 64.0 in | Wt 143.4 lb

## 2020-04-10 VITALS — BP 142/74 | HR 67 | Ht 64.5 in | Wt 143.0 lb

## 2020-04-10 DIAGNOSIS — E782 Mixed hyperlipidemia: Secondary | ICD-10-CM

## 2020-04-10 DIAGNOSIS — Z950 Presence of cardiac pacemaker: Secondary | ICD-10-CM

## 2020-04-10 DIAGNOSIS — I10 Essential (primary) hypertension: Secondary | ICD-10-CM

## 2020-04-10 DIAGNOSIS — Z79899 Other long term (current) drug therapy: Secondary | ICD-10-CM

## 2020-04-10 DIAGNOSIS — Z0000 Encounter for general adult medical examination without abnormal findings: Secondary | ICD-10-CM

## 2020-04-10 DIAGNOSIS — I2510 Atherosclerotic heart disease of native coronary artery without angina pectoris: Secondary | ICD-10-CM

## 2020-04-10 DIAGNOSIS — I5032 Chronic diastolic (congestive) heart failure: Secondary | ICD-10-CM

## 2020-04-10 DIAGNOSIS — I482 Chronic atrial fibrillation, unspecified: Secondary | ICD-10-CM

## 2020-04-10 DIAGNOSIS — I4821 Permanent atrial fibrillation: Secondary | ICD-10-CM | POA: Diagnosis not present

## 2020-04-10 DIAGNOSIS — Z7901 Long term (current) use of anticoagulants: Secondary | ICD-10-CM | POA: Diagnosis not present

## 2020-04-10 DIAGNOSIS — D6869 Other thrombophilia: Secondary | ICD-10-CM

## 2020-04-10 DIAGNOSIS — Z136 Encounter for screening for cardiovascular disorders: Secondary | ICD-10-CM

## 2020-04-10 DIAGNOSIS — Z0001 Encounter for general adult medical examination with abnormal findings: Secondary | ICD-10-CM

## 2020-04-10 DIAGNOSIS — R7309 Other abnormal glucose: Secondary | ICD-10-CM

## 2020-04-10 DIAGNOSIS — E559 Vitamin D deficiency, unspecified: Secondary | ICD-10-CM

## 2020-04-10 NOTE — Patient Instructions (Signed)
Medication Instructions:  No Changes *If you need a refill on your cardiac medications before your next appointment, please call your pharmacy*   Lab Work: No Labs  If you have labs (blood work) drawn today and your tests are completely normal, you will receive your results only by: Marland Kitchen MyChart Message (if you have MyChart) OR . A paper copy in the mail If you have any lab test that is abnormal or we need to change your treatment, we will call you to review the results.   Testing/Procedures: None   Follow-Up: At [facility], you and your health needs are our priority.  As part of our continuing mission to provide you with exceptional heart care, we have created designated Provider Care Teams.  These Care Teams include your primary Cardiologist (physician) and Advanced Practice Providers (APPs -  Physician Assistants and Nurse Practitioners) who all work together to provide you with the care you need, when you need it.  We recommend signing up for the patient portal called "MyChart".  Sign up information is provided on this After Visit Summary.  MyChart is used to connect with patients for Virtual Visits (Telemedicine).  Patients are able to view lab/test results, encounter notes, upcoming appointments, etc.  Non-urgent messages can be sent to your provider as well.   To learn more about what you can do with MyChart, go to [email or web address].    Your next appointment:   1 year(s)  The format for your next appointment:   In Person  Provider:   Sanda Klein, MD

## 2020-04-10 NOTE — Progress Notes (Signed)
[name], female   DOB: 1928/02/08, 86 y.o.   MRN: [id number]    Cardiology Office Note    Date:  04/15/2020   [name], DOB Jan 22, 1928, MRN [id number]  PCP:  [name], MD  Cardiologist:   Sanda Klein, MD   Chief Complaint  Patient presents with   Atrial Fibrillation   Pacemaker Check    History of Present Illness:  Misty Blackwell is a 86 y.o. female who presents for follow-up of permanent atrial fibrillation with slow ventricular response and pacemaker ([employer]tr[employer]), remote TIA, HTN, minor CAD, asymptomatic pacemaker-detected NSVT, history of diastolic HF.  She recently had a fall. She tripped in her home and hit the back of her head. She has a bruise over the back of her neck. She did go to the emergency room and a CT of the head and neck did not show any severe injuries or bleeding. This is the only fall she has had in many years. She continues to live independently. She denies any dizziness or syncope associated with it. She does not have a headache and has not had any focal neurological complaints. The neck site is a little sore.  Otherwise she is done quite well. She took Lasix a few weeks ago for some swelling, but this is unnecessary about once every month or so. She has not been troubled by palpitations, syncope, orthopnea, PND, bleeding problems.  She is not having any issues recommend related to her breast cancer surgery. She has a follow-up with Dr. Lindi Adie in February.  Pacemaker interrogation performed today shows normal device function. She has about 80% V pacing and occasional episodes of RVR, but mostly well rate controlled. Only 3 episodes of high ventricular rate are recorded, 2 of which probably represent true nonsustained VT 7 beats and 10 beats respectively. Is less clear if the third event lasting for only 13 beats represents RVR or NSVT. It is very irregular. Not device dependent. Estimated generator longevity is  23 months.  In 2016 she underwent echocardiography and nuclear stress testing.  Left ventricular systolic function was normal, she had a fixed moderate apical defect but without reversible ischemia.  Moderate tricuspid regurgitation and moderate pulmonary artery hypertension with estimated systolic PA pressure of 58 mmHg.  In April 2019 she had a nuclear stress test showing normal perfusion and an echocardiogram which showed unchanged findings of mild LVH, normal left ventricular systolic function, mild left atrial dilation, improved degree of pulmonary hypertension 41 mmHg.   Past Medical History:  Diagnosis Date   Anxiety    Atrial fib/flutter, transient    Atrial fibrillation, chronic (HCC)    CHF (congestive heart failure) ([place]) 10/29/2009   Echo - EF >55%; normal LV size and systolic function; unable to assess diastolic fcn due to E/A fusion, pulmonary vein flow pattern suggests elevated filling pressure; marked biatrail dilation, mild/mod tricuspid regurgitation; mod pulmonary htn; mild/mod mitral regurgitation; although echocardiographic features are incomplete findings suggest possible infiltrative cardiomyopathy (maybe amyloidosi   Coronary artery disease 03/19/2002   R/P Cardiolite - EF 76%; nromal static and dynamic myocardial perfusion images; normal wall motion and endocardial thickening in all vascular territories   Dyspnea    Dysrhythmia    Facial numbness 12/26/2008   carotid doppler - R and L ICAs 0-49% diameter reduction (velocities suggest low end of scale)   Hypertension    Pacemaker    Peripheral neuropathy    Pneumonia 2017   Skin cancer  s/p surgical removal.   Stroke ([place]) 04/2017   TIA (transient ischemic attack)     Past Surgical History:  Procedure Laterality Date   ABDOMINAL HYSTERECTOMY     APPENDECTOMY     BREAST LUMPECTOMY WITH RADIOACTIVE SEED LOCALIZATION Right 12/13/2019   Procedure: RIGHT BREAST LUMPECTOMY WITH RADIOACTIVE SEED  LOCALIZATION;  Surgeon: Donnie Mesa, MD;  Location: [facility];  Service: General;  Laterality: Right;  LMA VS MAC   BREAST SURGERY Left [place]  08/06/2005   minimal coronary disease predominant RCA; no significant atherosclerosis; new onset sick sinus syndrome and atrial flutter w/ ventricular response, controlled on med therapy; systemic HTN, normal renal arteries   CARDIOVERSION  11/19/2009   successful DCCV from AF to sinus type rhythm   CHOLECYSTECTOMY     EYE SURGERY Bilateral 2013   Skin cancer resection     TONSILLECTOMY  1938    Outpatient Medications Prior to Visit  Medication Sig Dispense Refill   acidophilus (RISAQUAD) CAPS capsule Take 1 capsule by mouth daily.     anastrozole (ARIMIDEX) 1 MG tablet Take 1 tablet (1 mg total) by mouth daily. 90 tablet 3   Ascorbic Acid (VITAMIN C) 1000 MG tablet Take 1,000 mg by mouth daily.      Cholecalciferol (VITAMIN D3) 50 MCG (2000 UT) capsule Take by mouth.     Cyanocobalamin (VITAMIN B 12 PO) Take 1,000 mcg by mouth daily.      diltiazem (CARDIZEM CD) 180 MG 24 hr capsule TAKE 1 CAPSULE BY MOUTH EVERY DAY (Patient taking differently: Take 180 mg by mouth daily.) 90 capsule 2   furosemide (LASIX) 20 MG tablet Take 1 tablet (20 mg total) by mouth daily. 30 tablet 11   GNP IRON 200 (65 Fe) MG TABS Take by mouth.     hydrOXYzine (ATARAX/VISTARIL) 50 MG tablet Take     1 to 2 tablets     1 hour before Bedtime        as needed for Sleep 60 tablet 0   Magnesium 250 MG TABS Take 250 mg by mouth daily.     potassium chloride SA (KLOR-CON) 20 MEQ tablet Take 1 tablet daily for potassium replacement. (Patient taking differently: Take 20 mEq by mouth daily.) 90 tablet 3   telmisartan (MICARDIS) 80 MG tablet Take 1 tablet (80 mg total) by mouth daily. 90 tablet 3   traMADol (ULTRAM) 50 MG tablet Take by mouth every 6 (six) hours as needed.     warfarin (COUMADIN) 3 MG tablet TAKE 1 TO 1 AND 1/2 TABLETS BY  MOUTH AS DIRECTED 135 tablet 1   zinc gluconate 50 MG tablet Take 50 mg by mouth daily.     Cholecalciferol (VITAMIN D) 125 MCG (5000 UT) CAPS Take 5,000 Units by mouth daily.     ferrous fumarate (FERRO-SEQUELS) 18 MG CR tablet Take 18 mg by mouth daily.     No facility-administered medications prior to visit.     Allergies:   Latex, Ace inhibitors, Augmentin [amoxicillin-pot clavulanate], Ciprofloxacin, Levaquin [levofloxacin in d5w], Zocor [simvastatin], Acrylic polymer [carbomer], Chocolate, and Gabapentin   Social History   Socioeconomic History   Marital status: Widowed    Spouse name: Not on file   Number of children: 0   Years of education: Not on file   Highest education level: Not on file  Occupational History   Not on file  Tobacco Use   Smoking status: Never Smoker   Smokeless tobacco:  Never Used  Vaping Use   Vaping Use: Never used  Substance and Sexual Activity   Alcohol use: No   Drug use: No   Sexual activity: Never  Other Topics Concern   Not on file  Social History Narrative   Widowed.  Lives alone.  Ambulates independently.   Social Determinants of Health   Financial Resource Strain: Not on file  Food Insecurity: Not on file  Transportation Needs: Not on file  Physical Activity: Not on file  Stress: Not on file  Social Connections: Not on file     Family History:  The patient's family history includes Cirrhosis in her brother; Diabetes in her mother; Heart attack in her father; Heart disease in her father and mother.   ROS:   Please see the history of present illness.    ROS All other systems are reviewed and are negative.   PHYSICAL EXAM:   VS:  BP (!) 142/74    Pulse 67    Ht 5' 4.5" (1.638 m)    Wt 143 lb (64.9 kg)    SpO2 95%    BMI 24.17 kg/m       General: Alert, oriented x3, no distress, healthy left subclavian pacemaker site Head: She has a flat ecchymosis on the left side of her posterior neck, otherwise no evidence  of trauma, PERRL, EOMI, no exophtalmos or lid lag, no myxedema, no xanthelasma; normal ears, nose and oropharynx Neck: normal jugular venous pulsations and no hepatojugular reflux; brisk carotid pulses without delay and no carotid bruits Chest: clear to auscultation, no signs of consolidation by percussion or palpation, normal fremitus, symmetrical and full respiratory excursions Cardiovascular: normal position and quality of the apical impulse, regular rhythm, normal first and paradoxically split second heart sounds, no murmurs, rubs or gallops Abdomen: no tenderness or distention, no masses by palpation, no abnormal pulsatility or arterial bruits, normal bowel sounds, no hepatosplenomegaly Extremities: no clubbing, cyanosis or edema; 2+ radial, ulnar and brachial pulses bilaterally; 2+ right femoral, posterior tibial and dorsalis pedis pulses; 2+ left femoral, posterior tibial and dorsalis pedis pulses; no subclavian or femoral bruits Neurological: grossly nonfocal Psych: Normal mood and affect  Wt Readings from Last 3 Encounters:  04/10/20 143 lb 6.4 oz (65 kg)  04/10/20 143 lb (64.9 kg)  04/05/20 140 lb (63.5 kg)      Studies/Labs Reviewed:   EKG:  EKG is not ordered today.  ECG from visit on 04/05/2020 shows coarse atrial fibrillation with occasional ventricular pacing 03/06/2020: BNP 144.8 04/10/2020: ALT 11; BUN 24; Creat 0.68; Hemoglobin 12.2; Magnesium 2.0; Platelets 252; Potassium 4.3; Sodium 142; TSH 1.89   Lipid Panel    Component Value Date/Time   CHOL 153 04/10/2020 1359   TRIG 131 04/10/2020 1359   HDL 40 (L) 04/10/2020 1359   CHOLHDL 3.8 04/10/2020 1359   VLDL 34 (H) 09/28/2016 1125   LDLCALC 90 04/10/2020 1359     ASSESSMENT:    1. Permanent atrial fibrillation ([place])   2. Chronic diastolic heart failure ([place])   3. Essential hypertension   4. Long term current use of anticoagulant therapy   5. Pacemaker   6. Hyperlipidemia, mixed      PLAN:  In order of  problems listed above:  1. AFib with slow ventricular response: adequate rate control, occasional RVR. Keep on current diltiazem dose  She has high embolic risk with history of previous transient ischemic attack (CHADSVasc 8: age 64, TIA 2, HTN, CAD, CHF, gender).  She is compliant with anticoagulation. 2. CHF: intermittently symptomatic, only uses furosemide prn. Recommended to take furosemide if weight is >143 lb, whether or not she has swelling. 3. HTN:   Increase telmisartan to 80 mg daily and send log of BP in 2 weeks. 4. Warfarin: She had a recent fall that could have had serious consequences, but this has been the only event in many years. Risk of embolic stroke continues to outweigh the risk of serious bleeding consequences and that 5. PPM: She is not device dependent.  Normal device function.  Continue remote downloads every 3 months and yearly office visits. 6. HLP: could not tolerate other, more potent statins. LDL <100 probably appropriate. She actually is close to the target of 70.   Medication Adjustments/Labs and Tests Ordered: Current medicines are reviewed at length with the patient today.  Concerns regarding medicines are outlined above.  Medication changes, Labs and Tests ordered today are listed in the Patient Instructions below. Patient Instructions  Medication Instructions:  No Changes *If you need a refill on your cardiac medications before your next appointment, please call your pharmacy*   Lab Work: No Labs  If you have labs (blood work) drawn today and your tests are completely normal, you will receive your results only by:  [place] (if you have MyChart) OR  A paper copy in the mail If you have any lab test that is abnormal or we need to change your treatment, we will call you to review the results.   Testing/Procedures: None   Follow-Up: At [facility], you and your health needs are our priority.  As part of our continuing mission to provide you  with exceptional heart care, we have created designated Provider Care Teams.  These Care Teams include your primary Cardiologist (physician) and Advanced Practice Providers (APPs -  Physician Assistants and Nurse Practitioners) who all work together to provide you with the care you need, when you need it.  We recommend signing up for the patient portal called "MyChart".  Sign up information is provided on this After Visit Summary.  MyChart is used to connect with patients for Virtual Visits (Telemedicine).  Patients are able to view lab/test results, encounter notes, upcoming appointments, etc.  Non-urgent messages can be sent to your provider as well.   To learn more about what you can do with MyChart, go to [email or web address].    Your next appointment:   1 year(s)  The format for your next appointment:   In Person  Provider:   Sanda Klein, MD         Signed, Sanda Klein, MD  04/15/2020 12:25 PM    [facility] [place], [place], [name]  [address] Phone: [phone]; Fax: [phone]

## 2020-04-11 LAB — HEMOGLOBIN A1C
Hgb A1c MFr Bld: 5.5 % of total Hgb (ref <5.7)
Mean Plasma Glucose: 111 mg/dL
eAG (mmol/L): 6.2 mmol/L

## 2020-04-11 LAB — COMPLETE METABOLIC PANEL WITH GFR
AG Ratio: 1.6 (calc) (ref 1.0–2.5)
ALT: 11 U/L (ref 6–29)
AST: 16 U/L (ref 10–35)
Albumin: 4.2 g/dL (ref 3.6–5.1)
Alkaline phosphatase (APISO): 130 U/L (ref 37–153)
BUN: 24 mg/dL (ref 7–25)
CO2: 29 mmol/L (ref 20–32)
Calcium: 9.4 mg/dL (ref 8.6–10.4)
Chloride: 104 mmol/L (ref 98–110)
Creat: 0.68 mg/dL (ref 0.60–0.88)
GFR, Est African American: 89 mL/min/{1.73_m2} (ref >=60)
GFR, Est Non African American: 77 mL/min/{1.73_m2} (ref >=60)
Globulin: 2.7 g/dL (calc) (ref 1.9–3.7)
Glucose, Bld: 96 mg/dL (ref 65–99)
Potassium: 4.3 mmol/L (ref 3.5–5.3)
Sodium: 142 mmol/L (ref 135–146)
Total Bilirubin: 0.9 mg/dL (ref 0.2–1.2)
Total Protein: 6.9 g/dL (ref 6.1–8.1)

## 2020-04-11 LAB — URINALYSIS, ROUTINE W REFLEX MICROSCOPIC
Bacteria, UA: NONE SEEN /HPF
Bilirubin Urine: NEGATIVE
Glucose, UA: NEGATIVE
Hgb urine dipstick: NEGATIVE
Hyaline Cast: NONE SEEN /LPF
Ketones, ur: NEGATIVE
Nitrite: NEGATIVE
Protein, ur: NEGATIVE
Specific Gravity, Urine: 1.021 (ref 1.001–1.03)
Squamous Epithelial / HPF: NONE SEEN /HPF (ref <=5)
pH: 6 (ref 5.0–8.0)

## 2020-04-11 LAB — MICROALBUMIN / CREATININE URINE RATIO
Creatinine, Urine: 76 mg/dL (ref 20–275)
Microalb Creat Ratio: 32 mcg/mg creat — ABNORMAL HIGH (ref <30)
Microalb, Ur: 2.4 mg/dL

## 2020-04-11 LAB — CBC WITH DIFFERENTIAL/PLATELET
Absolute Monocytes: 810 cells/uL (ref 200–950)
Basophils Absolute: 64 cells/uL (ref 0–200)
Basophils Relative: 0.7 %
Eosinophils Absolute: 127 cells/uL (ref 15–500)
Eosinophils Relative: 1.4 %
HCT: 37 % (ref 35.0–45.0)
Hemoglobin: 12.2 g/dL (ref 11.7–15.5)
Lymphs Abs: 2748 cells/uL (ref 850–3900)
MCH: 30 pg (ref 27.0–33.0)
MCHC: 33 g/dL (ref 32.0–36.0)
MCV: 90.9 fL (ref 80.0–100.0)
MPV: 10.8 fL (ref 7.5–12.5)
Monocytes Relative: 8.9 %
Neutro Abs: 5351 cells/uL (ref 1500–7800)
Neutrophils Relative %: 58.8 %
Platelets: 252 10*3/uL (ref 140–400)
RBC: 4.07 10*6/uL (ref 3.80–5.10)
RDW: 12.2 % (ref 11.0–15.0)
Total Lymphocyte: 30.2 %
WBC: 9.1 10*3/uL (ref 3.8–10.8)

## 2020-04-11 LAB — LIPID PANEL
Cholesterol: 153 mg/dL (ref <200)
HDL: 40 mg/dL — ABNORMAL LOW (ref >=50)
LDL Cholesterol (Calc): 90 mg/dL (calc)
Non-HDL Cholesterol (Calc): 113 mg/dL (calc) (ref <130)
Total CHOL/HDL Ratio: 3.8 (calc) (ref <5.0)
Triglycerides: 131 mg/dL (ref <150)

## 2020-04-11 LAB — MAGNESIUM: Magnesium: 2 mg/dL (ref 1.5–2.5)

## 2020-04-11 LAB — TSH: TSH: 1.89 mIU/L (ref 0.40–4.50)

## 2020-04-11 LAB — VITAMIN D 25 HYDROXY (VIT D DEFICIENCY, FRACTURES): Vit D, 25-Hydroxy: 68 ng/mL (ref 30–100)

## 2020-04-11 LAB — INSULIN, RANDOM: Insulin: 8.8 u[IU]/mL

## 2020-04-13 NOTE — Progress Notes (Signed)
========================================================== ==========================================================  -    Total Chol = 153 -  Excellent   - Very low risk for Heart Attack  / Stroke ========================================================  - A1c - Normal- Great - No Diabetes ==========================================================  -  Vitamin D =- 68 - Excellent  ==========================================================  -  All Else - CBC - Kidneys - Electrolytes - Liver - Magnesium & Thyroid    - all  Normal / OK ==========================================================   - Keep up the [place] Work  ==========================================================

## 2020-04-15 ENCOUNTER — Encounter: Payer: Self-pay | Admitting: Cardiovascular Disease

## 2020-04-21 ENCOUNTER — Other Ambulatory Visit: Payer: Self-pay | Admitting: Internal Medicine

## 2020-04-21 DIAGNOSIS — F5101 Primary insomnia: Secondary | ICD-10-CM

## 2020-05-01 ENCOUNTER — Other Ambulatory Visit: Payer: Self-pay | Admitting: *Deleted

## 2020-05-01 MED ORDER — DILTIAZEM HCL ER COATED BEADS 180 MG PO CP24
ORAL_CAPSULE | ORAL | 2 refills | Status: DC
Start: 2020-05-01 — End: 2020-09-03

## 2020-05-09 ENCOUNTER — Ambulatory Visit (INDEPENDENT_AMBULATORY_CARE_PROVIDER_SITE_OTHER): Payer: Medicare Other

## 2020-05-09 DIAGNOSIS — I495 Sick sinus syndrome: Secondary | ICD-10-CM | POA: Diagnosis not present

## 2020-05-09 LAB — CUP PACEART REMOTE DEVICE CHECK
Battery Impedance: 3177 Ohm
Battery Remaining Longevity: 23 mo
Battery Voltage: 2.73 V
Brady Statistic RV Percent Paced: 63 %
Date Time Interrogation Session: 20220107074604
Implantable Lead Implant Date: 20110428
Implantable Lead Implant Date: 20110428
Implantable Lead Location: 753859
Implantable Lead Location: 753860
Implantable Lead Model: 4092
Implantable Lead Model: 4592
Implantable Pulse Generator Implant Date: 20110428
Lead Channel Impedance Value: 67 Ohm
Lead Channel Impedance Value: 769 Ohm
Lead Channel Pacing Threshold Amplitude: 0.625 V
Lead Channel Pacing Threshold Pulse Width: 0.4 ms
Lead Channel Setting Pacing Amplitude: 2.5 V
Lead Channel Setting Pacing Pulse Width: 0.4 ms
Lead Channel Setting Sensing Sensitivity: 4 mV

## 2020-05-16 ENCOUNTER — Other Ambulatory Visit: Payer: Self-pay

## 2020-05-16 ENCOUNTER — Ambulatory Visit (INDEPENDENT_AMBULATORY_CARE_PROVIDER_SITE_OTHER): Payer: Medicare Other

## 2020-05-16 DIAGNOSIS — Z5181 Encounter for therapeutic drug level monitoring: Secondary | ICD-10-CM

## 2020-05-16 DIAGNOSIS — I482 Chronic atrial fibrillation, unspecified: Secondary | ICD-10-CM | POA: Diagnosis not present

## 2020-05-16 DIAGNOSIS — Z7901 Long term (current) use of anticoagulants: Secondary | ICD-10-CM | POA: Diagnosis not present

## 2020-05-16 LAB — POCT INR: INR: 2.2 (ref 2.0–3.0)

## 2020-05-16 NOTE — Patient Instructions (Signed)
Continue taking 1.5 tablets daily except 1 tablet Sunday, Tuesday and Thursday. Repeat INR 6 weeks. Call if you have questions [phone] (Kristin/Raquel)

## 2020-05-20 ENCOUNTER — Other Ambulatory Visit: Payer: Self-pay | Admitting: Cardiovascular Disease

## 2020-05-20 DIAGNOSIS — I10 Essential (primary) hypertension: Secondary | ICD-10-CM

## 2020-05-23 NOTE — Progress Notes (Signed)
Remote pacemaker transmission.   

## 2020-05-27 ENCOUNTER — Other Ambulatory Visit: Payer: Self-pay

## 2020-05-27 DIAGNOSIS — Z7901 Long term (current) use of anticoagulants: Secondary | ICD-10-CM

## 2020-05-27 LAB — POC HEMOCCULT BLD/STL (HOME/3-CARD/SCREEN)
Card #2 Fecal Occult Blod, POC: NEGATIVE
Card #3 Fecal Occult Blood, POC: NEGATIVE
Fecal Occult Blood, POC: NEGATIVE

## 2020-05-28 DIAGNOSIS — Z1211 Encounter for screening for malignant neoplasm of colon: Secondary | ICD-10-CM | POA: Diagnosis not present

## 2020-05-28 DIAGNOSIS — Z1212 Encounter for screening for malignant neoplasm of rectum: Secondary | ICD-10-CM

## 2020-06-23 ENCOUNTER — Other Ambulatory Visit: Payer: Self-pay

## 2020-06-23 ENCOUNTER — Ambulatory Visit (INDEPENDENT_AMBULATORY_CARE_PROVIDER_SITE_OTHER): Payer: Medicare Other

## 2020-06-23 DIAGNOSIS — Z5181 Encounter for therapeutic drug level monitoring: Secondary | ICD-10-CM

## 2020-06-23 DIAGNOSIS — I482 Chronic atrial fibrillation, unspecified: Secondary | ICD-10-CM

## 2020-06-23 DIAGNOSIS — Z7901 Long term (current) use of anticoagulants: Secondary | ICD-10-CM

## 2020-06-23 LAB — POCT INR: INR: 2 (ref 2.0–3.0)

## 2020-06-23 NOTE — Patient Instructions (Signed)
Continue taking 1.5 tablets daily except 1 tablet Sunday, Tuesday and Thursday. Repeat INR 6 weeks. Call if you have questions [phone] (Kristin/Raquel)

## 2020-06-25 NOTE — Progress Notes (Signed)
Patient Care Team: [name], MD as PCP - General (Internal Medicine) [name], [name], MD as PCP - Cardiology (Cardiology) [name], MD as Consulting Physician (Urology) [name], [name], MD as Consulting Physician (Neurology) [name], MD as Consulting Physician (Neurology) [name], [name], MD as Consulting Physician (Cardiology) [name], MD (Inactive) as Consulting Physician (Gastroenterology) [name], RN as Oncology Nurse Navigator [name], RN as Oncology Nurse Navigator  DIAGNOSIS:    ICD-10-CM   1. Malignant neoplasm of upper-outer quadrant of right breast in female, estrogen receptor positive ([place])  C50.411    Z17.0     SUMMARY OF ONCOLOGIC HISTORY: Oncology History  Malignant neoplasm of upper-outer quadrant of right breast in female, estrogen receptor positive ([name])  11/15/2019 Cancer Staging   Staging form: Breast, AJCC 8th Edition - Clinical stage from 11/15/2019: Stage IA (cT1c, cN0, cM0, G2, ER+, PR+, HER2-) - Signed by [name], NP on 11/21/2019   11/15/2019 Initial Biopsy   Palpable right breast mass: 1.5 cm at 12 o'clock position, benign intramammary lymph node, axilla negative, right breast biopsy 1 grade 2 ER 80%, PR 50%, Ki-67 5%, HER-2 negative   12/13/2019 Surgery   Right lumpectomy ([facility]): invasive lobular carcinoma, grade 2, 2.4cm, clear margins.      CHIEF COMPLIANT: Follow-up of right breast cancer on anastrozole   INTERVAL HISTORY: Misty Blackwell is a 87 y.o. with above-mentioned history of right breast cancer who underwent a right lumpectomy and is currently on antiestrogen therapy with anastrozole. She presents to the clinic today for follow-up.   ALLERGIES:  is allergic to latex, ace inhibitors, augmentin [amoxicillin-pot clavulanate], ciprofloxacin, levaquin [levofloxacin in d5w], zocor [simvastatin], acrylic polymer [carbomer], chocolate, and gabapentin.  MEDICATIONS:  Current  Outpatient Medications  Medication Sig Dispense Refill   acidophilus (RISAQUAD) CAPS capsule Take 1 capsule by mouth daily.     anastrozole (ARIMIDEX) 1 MG tablet Take 1 tablet (1 mg total) by mouth daily. 90 tablet 3   Ascorbic Acid (VITAMIN C) 1000 MG tablet Take 1,000 mg by mouth daily.      Cholecalciferol (VITAMIN D3) 50 MCG (2000 UT) capsule Take by mouth.     Cyanocobalamin (VITAMIN B 12 PO) Take 1,000 mcg by mouth daily.      diltiazem (CARDIZEM CD) 180 MG 24 hr capsule TAKE 1 CAPSULE BY MOUTH EVERY DAY 90 capsule 2   furosemide (LASIX) 20 MG tablet Take 1 tablet (20 mg total) by mouth daily. 30 tablet 11   GNP IRON 200 (65 Fe) MG TABS Take by mouth.     hydrOXYzine (ATARAX/VISTARIL) 50 MG tablet TAKE 1 TO 2 TABLETS BY MOUTH EVERY NIGHT 1 HOUR BEFORE BEDTIME AS NEEDED FOR SLEEP 60 tablet 0   Magnesium 250 MG TABS Take 250 mg by mouth daily.     potassium chloride SA (KLOR-CON) 20 MEQ tablet Take 1 tablet daily for potassium replacement. (Patient taking differently: Take 20 mEq by mouth daily.) 90 tablet 3   telmisartan (MICARDIS) 80 MG tablet TAKE 1 TABLET(80 MG) BY MOUTH DAILY 90 tablet 3   traMADol (ULTRAM) 50 MG tablet Take by mouth every 6 (six) hours as needed.     warfarin (COUMADIN) 3 MG tablet TAKE 1 TO 1 AND 1/2 TABLETS BY MOUTH AS DIRECTED 135 tablet 1   zinc gluconate 50 MG tablet Take 50 mg by mouth daily.     No current facility-administered medications for this visit.    PHYSICAL EXAMINATION: ECOG  PERFORMANCE STATUS: 1 - Symptomatic but completely ambulatory  There were no vitals filed for this visit. There were no vitals filed for this visit.     LABORATORY DATA:  I have reviewed the data as listed CMP Latest Ref Rng & Units 04/10/2020 03/06/2020 01/08/2020  Glucose 65 - 99 mg/dL 96 87 98  BUN 7 - 25 mg/dL 24 21 19   Creatinine 0.60 - 0.88 mg/dL 0.68 0.72 0.82  Sodium 135 - 146 mmol/L 142 146(H) 157(H)  Potassium 3.5 - 5.3 mmol/L 4.3 4.4 5.3   Chloride 98 - 110 mmol/L 104 105 113(H)  CO2 20 - 32 mmol/L 29 26 24   Calcium 8.6 - 10.4 mg/dL 9.4 9.0 10.1  Total Protein 6.1 - 8.1 g/dL 6.9 - 7.5  Total Bilirubin 0.2 - 1.2 mg/dL 0.9 - 0.4  Alkaline Phos 38 - 126 U/L - - -  AST 10 - 35 U/L 16 - 15  ALT 6 - 29 U/L 11 - 10    Lab Results  Component Value Date   WBC 9.1 04/10/2020   HGB 12.2 04/10/2020   HCT 37.0 04/10/2020   MCV 90.9 04/10/2020   PLT 252 04/10/2020   NEUTROABS 5,351 04/10/2020    ASSESSMENT & PLAN:  Malignant neoplasm of upper-outer quadrant of right breast in female, estrogen receptor positive ([place]) 12/13/19:Right lumpectomy ([facility]): invasive lobular carcinoma, grade 2, 2.4cm, clear margins.ER 80%, PR 50%, HER-2 negative, Ki-67 5%  Treatment plan: Anastrozole adjuvant therapy 1 mg daily x5 years Anastrozole toxicities: Hot flashes have resolved. Otherwise tolerating it extremely well.    Breast cancer surveillance: 1.  Breast exam 06/26/2020: Benign 2. Mammogram to be done July 2022.  Return to clinic in 1 year for follow-up     No orders of the defined types were placed in this encounter.  The patient has a good understanding of the overall plan. she agrees with it. she will call with any problems that may develop before the next visit here.  Total time spent: 20 mins including face to face time and time spent for planning, charting and coordination of care  Rulon Eisenmenger, MD, MPH 06/26/2020  I, Cloyde Reams Dorshimer, am acting as scribe for Dr. [name].  I have reviewed the above documentation for accuracy and completeness, and I agree with the above.

## 2020-06-26 ENCOUNTER — Inpatient Hospital Stay: Payer: Medicare Other | Attending: Hematology and Oncology | Admitting: Hematology and Oncology

## 2020-06-26 ENCOUNTER — Telehealth: Payer: Self-pay | Admitting: Hematology and Oncology

## 2020-06-26 ENCOUNTER — Other Ambulatory Visit: Payer: Self-pay

## 2020-06-26 DIAGNOSIS — C50411 Malignant neoplasm of upper-outer quadrant of right female breast: Secondary | ICD-10-CM | POA: Insufficient documentation

## 2020-06-26 DIAGNOSIS — Z79811 Long term (current) use of aromatase inhibitors: Secondary | ICD-10-CM | POA: Insufficient documentation

## 2020-06-26 DIAGNOSIS — Z170 Estrogen receptor positive status [ER+]: Secondary | ICD-10-CM | POA: Diagnosis not present

## 2020-06-26 MED ORDER — ANASTROZOLE 1 MG PO TABS
1.0000 mg | ORAL_TABLET | Freq: Every day | ORAL | 3 refills | Status: DC
Start: 2020-06-26 — End: 2021-02-19

## 2020-06-26 NOTE — Assessment & Plan Note (Signed)
12/13/19:Right lumpectomy ([facility]): invasive lobular carcinoma, grade 2, 2.4cm, clear margins.ER 80%, PR 50%, HER-2 negative, Ki-67 5%  Treatment plan: Anastrozole adjuvant therapy 1 mg daily x5 years Anastrozole toxicities: Hot flashes have resolved. Otherwise tolerating it extremely well.    Breast cancer surveillance: 1.  Breast exam 06/26/2020: Benign 2. Mammogram to be done July 2022.  Return to clinic in 1 year for follow-up

## 2020-06-26 NOTE — Telephone Encounter (Signed)
Scheduled appts per 2/24 los. Gave pt a print out of AVS and appt reminder card.

## 2020-07-03 ENCOUNTER — Encounter: Payer: Self-pay | Admitting: Internal Medicine

## 2020-07-15 ENCOUNTER — Ambulatory Visit (INDEPENDENT_AMBULATORY_CARE_PROVIDER_SITE_OTHER): Payer: Medicare Other | Admitting: Adult Health Nurse Practitioner

## 2020-07-15 ENCOUNTER — Other Ambulatory Visit: Payer: Self-pay

## 2020-07-15 ENCOUNTER — Encounter: Payer: Self-pay | Admitting: Adult Health Nurse Practitioner

## 2020-07-15 VITALS — BP 140/80 | HR 83 | Temp 98.1°F | Ht 64.0 in | Wt 145.0 lb

## 2020-07-15 DIAGNOSIS — I10 Essential (primary) hypertension: Secondary | ICD-10-CM

## 2020-07-15 DIAGNOSIS — D6869 Other thrombophilia: Secondary | ICD-10-CM

## 2020-07-15 DIAGNOSIS — Z7901 Long term (current) use of anticoagulants: Secondary | ICD-10-CM

## 2020-07-15 DIAGNOSIS — E663 Overweight: Secondary | ICD-10-CM

## 2020-07-15 DIAGNOSIS — R7309 Other abnormal glucose: Secondary | ICD-10-CM

## 2020-07-15 DIAGNOSIS — I700 Atherosclerosis of aorta: Secondary | ICD-10-CM | POA: Diagnosis not present

## 2020-07-15 DIAGNOSIS — Z0001 Encounter for general adult medical examination with abnormal findings: Secondary | ICD-10-CM

## 2020-07-15 DIAGNOSIS — Z79899 Other long term (current) drug therapy: Secondary | ICD-10-CM

## 2020-07-15 DIAGNOSIS — I4821 Permanent atrial fibrillation: Secondary | ICD-10-CM | POA: Diagnosis not present

## 2020-07-15 DIAGNOSIS — I5032 Chronic diastolic (congestive) heart failure: Secondary | ICD-10-CM

## 2020-07-15 DIAGNOSIS — M1990 Unspecified osteoarthritis, unspecified site: Secondary | ICD-10-CM

## 2020-07-15 DIAGNOSIS — R6889 Other general symptoms and signs: Secondary | ICD-10-CM | POA: Diagnosis not present

## 2020-07-15 DIAGNOSIS — R300 Dysuria: Secondary | ICD-10-CM

## 2020-07-15 DIAGNOSIS — K219 Gastro-esophageal reflux disease without esophagitis: Secondary | ICD-10-CM

## 2020-07-15 DIAGNOSIS — Z950 Presence of cardiac pacemaker: Secondary | ICD-10-CM

## 2020-07-15 DIAGNOSIS — F5101 Primary insomnia: Secondary | ICD-10-CM

## 2020-07-15 DIAGNOSIS — Z0000 Encounter for general adult medical examination without abnormal findings: Secondary | ICD-10-CM

## 2020-07-15 DIAGNOSIS — E559 Vitamin D deficiency, unspecified: Secondary | ICD-10-CM

## 2020-07-15 DIAGNOSIS — E782 Mixed hyperlipidemia: Secondary | ICD-10-CM

## 2020-07-15 MED ORDER — CLOTRIMAZOLE-BETAMETHASONE 1-0.05 % EX CREA: 1.0000 "application " | TOPICAL_CREAM | Freq: Two times a day (BID) | CUTANEOUS | 2 refills | Status: DC

## 2020-07-15 NOTE — Patient Instructions (Addendum)
  We will contact you in 1-3 day with your lab results via telephone.  We have sent in a refill of the steroid cream for you.  Be sure to use this externally only.  Keep up the good work with your activity!   GENERAL HEALTH GOALS  Know what a healthy weight is for you (roughly BMI <25) and aim to maintain this  Aim for 7+ servings of fruits and vegetables daily  70-80+ fluid ounces of water or unsweet tea for healthy kidneys  Limit to max 1 drink of alcohol per day; avoid smoking/tobacco  Limit animal fats in diet for cholesterol and heart health - choose grass fed whenever available  Avoid highly processed foods, and foods high in saturated/trans fats  Aim for low stress - take time to unwind and care for your mental health  Aim for 150 min of moderate intensity exercise weekly for heart health, and weights twice weekly for bone health  Aim for 7-9 hours of sleep daily

## 2020-07-15 NOTE — Progress Notes (Signed)
MEDICARE ANNUAL WELLNESS VISIT AND FOLLOW UP   Assessment:    Encounter for Medicare annual wellness exam Yearly  Essential hypertension Continue medications: Telmisartan 80mg  daily Will increase micardis to 1 pill a day Monitor blood pressure at home; call if consistently over 130/80 Continue DASH diet.   Reminder to go to the ER if any CP, SOB, nausea, dizziness, severe HA, changes vision/speech, left arm numbness and tingling and jaw pain.  Atherosclerosis of native coronary artery of native heart with angina pectoris ([place]) Control blood pressure, cholesterol, glucose, increase exercise.  Followed closely by cardiology Increase micardis to 1 pill and continue to monitor BP No chest pains recently, had perfusion studies and ECHO in April 2019   Chronic diastolic heart failure (HCC) Weights stable, monitor, no symptoms at this time Lasix Permanent atrial fibrillation ([place]) Followed by coumadin clinic Reminded to go to ER for any head injury, call with concerning signs of bleeding Cardiology Dr Sallyanne Kuster   Long term current use of anticoagulant therapy Acquired Thrombophilia ([place]) Followed by cardiology, Dr Roseanna Rainbow Coumadin 3mg , 1.5 tablets four days a week and 1 tablet three days a week. No S&S of bleeding  Gastroesophageal reflux disease, esophagitis presence not specified Symptoms controlled at this time, continue current regiment  lifestyle emphasized  Osteoarthritis, unspecified osteoarthritis type, unspecified site Mild in hands,  Doing well at this time  Vitamin D deficiency At goal at recent check;  Defer vitamin D level  Abnormal glucose Recent A1Cs at goal Discussed diet/exercise, weight management  Defer A1C; check CMP  Pacemaker Followed by cardiology  Medication management CBC, CMP/GFR  Hyperlipidemia No longer on medication secondary to age Continue low cholesterol diet and exercise.  Check lipid panel.   Overweight (BMI 25.0-29.9) Continue  to recommend diet heavy in fruits and veggies and low in animal meats, cheeses, and dairy products, appropriate calorie intake Discuss exercise recommendations routinely Continue to monitor weight at each visit   Malignant neoplasm of upper-outer quadrant of right breast in female, estrogen receptor positive ([place]) Follows with Oncology, Dr Lindi Adie Neuropathy BLE Soaks feet [name] & [name] helps  Insomnia Doing well at this time Has vistaril PRN  Medication Management Continued   Over 30 minutes of face to face interview, exam, counseling, chart review, and critical decision making was performed  Future Appointments  Date Time Provider [place]  08/04/2020  9:30 AM [facility]  08/08/2020  7:15 AM [facility] DEVICE REMOTES [facility]  10/30/2020 10:30 AM [name], MD [facility] None  11/07/2020  7:15 AM [facility] DEVICE REMOTES [facility]  01/13/2021 11:30 AM Garnet Sierras, NP [facility] None  02/06/2021  7:15 AM [facility] DEVICE REMOTES [facility]  02/19/2021  3:00 PM [name], MD [facility] None  04/28/2021  2:00 PM [name], MD [facility] None  06/25/2021  9:30 AM [name], MD [facility] None  07/15/2021  9:30 AM Liane Comber, NP [facility] None     Plan:   During the course of the visit the patient was educated and counseled about appropriate screening and preventive services including:    Pneumococcal vaccine   Influenza vaccine  Td vaccine  Prevnar 13  Screening electrocardiogram  Screening mammography  Bone densitometry screening  Colorectal cancer screening  Diabetes screening  Glaucoma screening  Nutrition counseling   Advanced directives: given info/requested copies    Subjective:   Misty Blackwell is a 87 y.o. female who presents for Medicare Annual Wellness Visit and 6 month follow up.  She reports overall she is  doing well.  No health concerns today.  She has history of breast cancer and had right s/p lumpectomy 8/21.   ([place]): invasive lobular carcinoma, grade 2, 2.4cm, clear margins.  ER 80%, PR 50%, HER-2 negative, Ki-67 5%.  Her treatment Anastozole therapy 54m x5 years and follows with Dr GLindi Adieand last OV was 06/26/20  Patient had negative /Nl Ht caths in x2 in 1993 & 2007.  In 2003, her Myoview was Negative for Ischemia.   In 2006 , she had an embolic CVA with Rt hemifacial paresthesias (resolved) and was found in Afib and has been on [facility]madin since [facility]  She had a PPM implanted in 2007 for SSS and is followed by Dr CSallyanne Kuster She recently had perfusion studies and ECHO in April of 2019 with good results.  Had probable TIA in Dec with aphasia that resolved, started on statin at that time.  She is on 1.5 tablets 4 days a week and 1 tablet other days, recently adjusted.   She follows with Dr DRoseanna Rainbowat Coumadin clinic.  She exercises every day on a stationary bike.  Reports that she has trouble sleeping.  She is using vistaril and not working at times.     Lab Results  Component Value Date   INR 2.0 06/23/2020   INR 2.2 05/16/2020   INR 2.6 04/04/2020   She is on lexapro and xanax AS needed, doing well at this time, no longer having depression. She continues to paint as a hobby.  BMI is Body mass index is 24.89 kg/m., she has been working on diet and exercise. Wt Readings from Last 3 Encounters:  07/15/20 145 lb (65.8 kg)  06/26/20 144 lb (65.3 kg)  04/10/20 143 lb 6.4 oz (65 kg)   Her blood pressure has been controlled at home, today their BP is  BP Readings from Last 3 Encounters:  07/15/20 140/80  06/26/20 (!) 168/67  04/10/20 134/80    She does not workout. She denies chest pain, shortness of breath, dizziness.   She is on cholesterol medication due to recent TIA with aphasia that resolved in Dec.. Her cholesterol is not at goal.  The cholesterol last visit was:   Lab Results  Component Value Date   CHOL 153 04/10/2020   HDL 40 (L) 04/10/2020   LDLCALC 90 04/10/2020   TRIG 131 04/10/2020   CHOLHDL 3.8 04/10/2020   She has history of prediabetes (A1c 5.9% 2011) Last A1C in the office was:  Lab Results  Component Value Date   HGBA1C 5.5 04/10/2020     Lab Results  Component Value Date   GFRNONAA 77 04/10/2020   Patient is on Vitamin D supplement for deficiency (13, 2008).   Lab Results  Component Value Date   VD25OH 68 04/10/2020      Medication Review Current Outpatient Medications on File Prior to Visit  Medication Sig Dispense Refill  . acidophilus (RISAQUAD) CAPS capsule Take 1 capsule by mouth daily.    .Marland Kitchenanastrozole (ARIMIDEX) 1 MG tablet Take 1 tablet (1 mg total) by mouth daily. 90 tablet 3  . Ascorbic Acid (VITAMIN C) 1000 MG tablet Take 1,000 mg by mouth daily.     . Cholecalciferol (VITAMIN D3) 50 MCG (2000 UT) capsule Take by mouth.    . Cyanocobalamin (VITAMIN B 12 PO) Take 1,000 mcg by mouth daily.     .Marland Kitchendiltiazem (CARDIZEM CD) 180 MG  24 hr capsule TAKE 1 CAPSULE BY MOUTH EVERY DAY 90 capsule 2  . furosemide (LASIX) 20 MG tablet Take 1 tablet (20 mg total) by mouth daily. 30 tablet 11  . GNP IRON 200 (65 Fe) MG TABS Take by mouth.    . hydrOXYzine (ATARAX/VISTARIL) 50 MG tablet TAKE 1 TO 2 TABLETS BY MOUTH EVERY NIGHT 1 HOUR BEFORE BEDTIME AS NEEDED FOR SLEEP 60 tablet 0  . Magnesium 250 MG TABS Take 250 mg by mouth daily.    . potassium chloride SA (KLOR-CON) 20 MEQ tablet Take 1 tablet daily for potassium replacement. (Patient taking differently: Take 20 mEq by mouth daily.) 90 tablet 3  . telmisartan (MICARDIS) 80 MG tablet TAKE 1 TABLET(80 MG) BY MOUTH DAILY 90 tablet 3  . warfarin (COUMADIN) 3 MG tablet TAKE 1 TO 1 AND 1/2 TABLETS BY MOUTH AS DIRECTED 135 tablet 1  . zinc gluconate 50 MG tablet Take 50 mg by mouth daily.     No current facility-administered medications on file prior  to visit.    Current Problems (verified) Patient Active Problem List   Diagnosis Date Noted  . Invasive ductal carcinoma of breast, female, right ([place]) 12/13/2019  . Malignant neoplasm of upper-outer quadrant of right breast in female, estrogen receptor positive ([place]) 11/21/2019  . Anxiety 06/18/2019  . Insomnia 06/18/2019  . Recurrent major 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 ([place]) 06/18/2019  . SSS (sick sinus syndrome) ([place]) 09/26/2018  . Abnormal glucose 08/29/2018  . Neural foraminal stenosis of cervical spine 04/18/2018  . History of TIA (transient ischemic attack) 04/17/2018  . Chronic atrial fibrillation ([place])   . Overweight (BMI 25.0-29.9) 11/21/2017  . Chronic diastolic heart failure ([place]) 11/10/2017  . Vitamin D deficiency 08/15/2013  . Medication management 08/15/2013  . Pacemaker 09/19/2012  . Long term current use of anticoagulant therapy 07/18/2012  . Hyperlipidemia, mixed 09/03/2008  . Essential hypertension 09/03/2008  . Coronary atherosclerosis 09/03/2008  . GERD 09/03/2008  . FIBROCYSTIC BREAST DISEASE 09/03/2008  . Osteoarthritis 09/03/2008    Screening Tests Immunization History  Administered Date(s) Administered  . DT (Pediatric) 03/19/2015  . Influenza Split 05/04/2011  . Influenza, High Dose Seasonal PF 03/19/2014, 12/23/2015, 02/03/2017, 12/26/2018, 01/28/2020  . Influenza,inj,quad, With Preservative 05/21/2013  . Influenza-Unspecified 02/04/2015, 02/03/2017, 01/28/2020  . PFIZER(Purple Top)SARS-COV-2 Vaccination 06/16/2019, 07/09/2019, 03/21/2020  . Pneumococcal Conjugate-13 03/19/2014  . Pneumococcal Polysaccharide-23 05/04/2011  . Pneumococcal-Unspecified 05/03/2001  . Td 05/04/2003    Preventative care: Last colonoscopy: 2010 declines due to age.  Last mammogram: refused DEXA declines CT head without contrast 04/2018 CXR 05/2018 Stress test 08/2017 Echo 04/2018  Prior vaccinations: TD or Tdap: 2016  Influenza: 2019 Pneumococcal:  2003 Prevnar13: 2015  Shingles/Zostavax: Declined  Names of Other Physician/Practitioners you currently use: 1. [facility]- here for primary care 2. Dr. Katy Fitch , eye doctor, last visit 2022 has upcoming appointment 3. Dr. Ronnald Ramp, dentist, last visit 2022 Follows with Derm routinely  Patient Care Team: [name], MD as PCP - General (Internal Medicine) [name], [name], MD as PCP - Cardiology (Cardiology) [name], MD as Consulting Physician (Urology) [name], [name], MD as Consulting Physician (Neurology) [name], MD as Consulting Physician (Neurology) [name], [name], MD as Consulting Physician (Cardiology) [name], MD (Inactive) as Consulting Physician (Gastroenterology) [name], RN as Oncology Nurse Navigator [name], RN as Oncology Nurse Navigator  Allergies Allergies  Allergen Reactions  . Latex Itching  . Ace Inhibitors Other (See  Comments)    Unknown reaction  . Augmentin [Amoxicillin-Pot Clavulanate] Other (See Comments)  . Ciprofloxacin Other (See Comments)  . Levaquin [Levofloxacin In D5w] Other (See Comments)  . Zocor [Simvastatin] Other (See Comments)  . Acrylic Polymer [Carbomer] Itching  . Chocolate Other (See Comments)    migraine's   . Gabapentin Other (See Comments)    Unsteady gait     SURGICAL HISTORY She  has a past surgical history that includes Cardioversion (11/19/2009); Cardiac catheterization (08/06/2005); Cholecystectomy; Appendectomy; Abdominal hysterectomy; Skin cancer resection; Tonsillectomy (1938); Breast surgery (Left, 1949); Eye surgery (Bilateral, 2013); and Breast lumpectomy with radioactive seed localization (Right, 12/13/2019). FAMILY HISTORY Her family history includes Cirrhosis in her brother; Diabetes in her mother; Heart attack in her father; Heart disease in her father and mother. SOCIAL HISTORY She  reports that she has never smoked. She has never  used smokeless tobacco. She reports that she does not drink alcohol and does not use drugs.  MEDICARE WELLNESS OBJECTIVES: Physical activity: Current Exercise Habits: Home exercise routine, Type of exercise: walking, Time (Minutes): 40, Frequency (Times/Week): 7, Weekly Exercise (Minutes/Week): 280, Intensity: Moderate Cardiac risk factors: Cardiac Risk Factors include: advanced age (>73mn, >>31women);dyslipidemia;family history of premature cardiovascular disease;hypertension Depression/mood screen:   [facility] 07/15/2020  Decreased Interest 0  Down, Depressed, Hopeless 0  PHQ - 2 Score 0  Altered sleeping -  Tired, decreased energy -  Change in appetite -  Feeling bad or failure about yourself  -  Trouble concentrating -  Moving slowly or fidgety/restless -  Suicidal thoughts -  PHQ-9 Score -  Difficult doing work/chores -  Some recent data might be hidden    ADLs:  In your present state of health, do you have any difficulty performing the following activities: 07/15/2020 01/08/2020  Hearing? Y Y  Comment - hearing aid  Vision? N N  Difficulty concentrating or making decisions? N N  Walking or climbing stairs? N N  Dressing or bathing? N N  Doing errands, shopping? N N  Preparing Food and eating ? N N  Using the Toilet? N N  In the past six months, have you accidently leaked urine? N N  Do you have problems with loss of bowel control? N N  Managing your Medications? N N  Managing your Finances? - N  Housekeeping or managing your Housekeeping? N N  Some recent data might be hidden     Cognitive Testing  Alert? Yes  Normal Appearance?Yes  Oriented to person? Yes  Place? Yes   Time? Yes  Recall of three objects?  Yes  Can perform simple calculations? Yes  Displays appropriate judgment?Yes  Can read the correct time from a watch face?Yes  EOL planning: Does Patient Have a Medical Advance Directive?: Yes Type of Advance Directive: [place] Chart?: No - copy requested   Objective:   Today's Vitals   07/15/20 0947  BP: 140/80  Pulse: 83  Temp: 98.1 F (36.7 C)  SpO2: 97%  Weight: 145 lb (65.8 kg)  Height: _0  (1.626 m)  PainSc: 0-No pain   Body mass index is 24.89 kg/m.  Physical Exam:  BP 140/80   Pulse 83   Temp 98.1 F (36.7 C)   Ht _1  (1.626 m)   Wt 145 lb (65.8 kg)   SpO2 97%   BMI 24.89 kg/m   General Appearance: Well nourished, in no apparent distress.  Eyes: PERRLA, EOMs, conjunctiva no swelling or erythema Sinuses: No Frontal/maxillary tenderness ENT/Mouth: Ext aud canals clear, TMs without erythema, bulging. No erythema, swelling, or exudate on post pharynx.  Tonsils not swollen or erythematous. Hearing normal.  Neck: Supple, thyroid normal.  Respiratory: Respiratory effort normal, BS equal bilaterally without rales, rhonchi, wheezing or stridor.  Cardio: RRR with no MRGs. Brisk peripheral pulses with scant non-pitting edema   Abdomen: Soft, obese/mildly distended, + BS.  Non tender, no guarding, rebound, hernias, masses. Lymphatics: Non tender without lymphadenopathy.  Musculoskeletal: Full ROM, 5/5 strength, normal gait.  Skin: Warm, dry without rashes, lesions; he has fragile skin and numerous small ecchymoses to bilateral upper extremities Neuro: Cranial nerves intact. Normal muscle tone, no cerebellar symptoms. Sensation intact.  Psych: Awake and oriented X 3, normal affect, Insight and Judgment appropriate.   Medicare Attestation I have personally reviewed: The patient's medical and social history Their use of alcohol, tobacco or illicit drugs Their current medications and supplements The patient's functional ability including ADLs,fall risks, home safety risks, cognitive, and hearing and visual impairment Diet and physical activities Evidence for depression or mood disorders  The patient's weight, height, BMI, and  visual acuity have been recorded in the chart.  I have made referrals, counseling, and provided education to the patient based on review of the above and I have provided the patient with a written personalized care plan for preventive services.     Garnet Sierras, Laqueta Jean, DNP [facility] 07/15/2020  11:16 AM

## 2020-07-16 ENCOUNTER — Other Ambulatory Visit: Payer: Self-pay | Admitting: Adult Health Nurse Practitioner

## 2020-07-16 DIAGNOSIS — F5101 Primary insomnia: Secondary | ICD-10-CM

## 2020-07-16 LAB — COMPLETE METABOLIC PANEL WITH GFR
AG Ratio: 1.7 (calc) (ref 1.0–2.5)
ALT: 10 U/L (ref 6–29)
AST: 15 U/L (ref 10–35)
Albumin: 4.5 g/dL (ref 3.6–5.1)
Alkaline phosphatase (APISO): 151 U/L (ref 37–153)
BUN: 22 mg/dL (ref 7–25)
CO2: 32 mmol/L (ref 20–32)
Calcium: 9.6 mg/dL (ref 8.6–10.4)
Chloride: 102 mmol/L (ref 98–110)
Creat: 0.7 mg/dL (ref 0.60–0.88)
GFR, Est African American: 88 mL/min/{1.73_m2} (ref >=60)
GFR, Est Non African American: 76 mL/min/{1.73_m2} (ref >=60)
Globulin: 2.7 g/dL (calc) (ref 1.9–3.7)
Glucose, Bld: 99 mg/dL (ref 65–99)
Potassium: 4.2 mmol/L (ref 3.5–5.3)
Sodium: 142 mmol/L (ref 135–146)
Total Bilirubin: 0.6 mg/dL (ref 0.2–1.2)
Total Protein: 7.2 g/dL (ref 6.1–8.1)

## 2020-07-16 LAB — CBC WITH DIFFERENTIAL/PLATELET
Absolute Monocytes: 817 cells/uL (ref 200–950)
Basophils Absolute: 60 cells/uL (ref 0–200)
Basophils Relative: 0.7 %
Eosinophils Absolute: 69 cells/uL (ref 15–500)
Eosinophils Relative: 0.8 %
HCT: 43 % (ref 35.0–45.0)
Hemoglobin: 14 g/dL (ref 11.7–15.5)
Lymphs Abs: 2537 cells/uL (ref 850–3900)
MCH: 29.4 pg (ref 27.0–33.0)
MCHC: 32.6 g/dL (ref 32.0–36.0)
MCV: 90.3 fL (ref 80.0–100.0)
MPV: 10.5 fL (ref 7.5–12.5)
Monocytes Relative: 9.5 %
Neutro Abs: 5117 cells/uL (ref 1500–7800)
Neutrophils Relative %: 59.5 %
Platelets: 229 10*3/uL (ref 140–400)
RBC: 4.76 10*6/uL (ref 3.80–5.10)
RDW: 12.5 % (ref 11.0–15.0)
Total Lymphocyte: 29.5 %
WBC: 8.6 10*3/uL (ref 3.8–10.8)

## 2020-07-16 LAB — URINALYSIS W MICROSCOPIC + REFLEX CULTURE
Bacteria, UA: NONE SEEN /HPF
Bilirubin Urine: NEGATIVE
Glucose, UA: NEGATIVE
Hgb urine dipstick: NEGATIVE
Hyaline Cast: NONE SEEN /LPF
Ketones, ur: NEGATIVE
Leukocyte Esterase: NEGATIVE
Nitrites, Initial: NEGATIVE
Protein, ur: NEGATIVE
RBC / HPF: NONE SEEN /HPF (ref 0–2)
Specific Gravity, Urine: 1.02 (ref 1.001–1.03)
Squamous Epithelial / HPF: NONE SEEN /HPF (ref <=5)
WBC, UA: NONE SEEN /HPF (ref 0–5)
pH: 7 (ref 5.0–8.0)

## 2020-07-16 LAB — NO CULTURE INDICATED

## 2020-08-03 ENCOUNTER — Other Ambulatory Visit: Payer: Self-pay | Admitting: Internal Medicine

## 2020-08-03 DIAGNOSIS — E876 Hypokalemia: Secondary | ICD-10-CM

## 2020-08-03 MED ORDER — POTASSIUM CHLORIDE CRYS ER 20 MEQ PO TBCR: EXTENDED_RELEASE_TABLET | ORAL | 3 refills | Status: DC

## 2020-08-04 ENCOUNTER — Ambulatory Visit (INDEPENDENT_AMBULATORY_CARE_PROVIDER_SITE_OTHER): Payer: Medicare Other

## 2020-08-04 ENCOUNTER — Other Ambulatory Visit: Payer: Self-pay

## 2020-08-04 DIAGNOSIS — I482 Chronic atrial fibrillation, unspecified: Secondary | ICD-10-CM | POA: Diagnosis not present

## 2020-08-04 DIAGNOSIS — Z7901 Long term (current) use of anticoagulants: Secondary | ICD-10-CM | POA: Diagnosis not present

## 2020-08-04 DIAGNOSIS — Z5181 Encounter for therapeutic drug level monitoring: Secondary | ICD-10-CM | POA: Diagnosis not present

## 2020-08-04 LAB — POCT INR: INR: 2 (ref 2.0–3.0)

## 2020-08-04 NOTE — Patient Instructions (Signed)
Continue taking 1.5 tablets daily except 1 tablet Sunday, Tuesday and Thursday. Repeat INR 6 weeks. Call if you have questions [phone] (Kristin/Raquel)

## 2020-08-08 ENCOUNTER — Ambulatory Visit (INDEPENDENT_AMBULATORY_CARE_PROVIDER_SITE_OTHER): Payer: Medicare Other

## 2020-08-08 DIAGNOSIS — I495 Sick sinus syndrome: Secondary | ICD-10-CM | POA: Diagnosis not present

## 2020-08-12 LAB — CUP PACEART REMOTE DEVICE CHECK
Battery Impedance: 3436 Ohm
Battery Remaining Longevity: 20 mo
Battery Voltage: 2.72 V
Brady Statistic RV Percent Paced: 80 %
Date Time Interrogation Session: 20220408072422
Implantable Lead Implant Date: 20110428
Implantable Lead Implant Date: 20110428
Implantable Lead Location: 753859
Implantable Lead Location: 753860
Implantable Lead Model: 4092
Implantable Lead Model: 4592
Implantable Pulse Generator Implant Date: 20110428
Lead Channel Impedance Value: 67 Ohm
Lead Channel Impedance Value: 733 Ohm
Lead Channel Pacing Threshold Amplitude: 0.75 V
Lead Channel Pacing Threshold Pulse Width: 0.4 ms
Lead Channel Setting Pacing Amplitude: 2.5 V
Lead Channel Setting Pacing Pulse Width: 0.4 ms
Lead Channel Setting Sensing Sensitivity: 4 mV

## 2020-08-21 NOTE — Progress Notes (Signed)
Remote pacemaker transmission.   

## 2020-09-01 ENCOUNTER — Telehealth: Payer: Self-pay | Admitting: Cardiovascular Disease

## 2020-09-01 NOTE — Telephone Encounter (Signed)
Pt updated and verbalized understanding.  

## 2020-09-01 NOTE — Telephone Encounter (Signed)
New Message:    Pt said she felt like she was shocked in her chest last Thursday. After it happen, she did not feel right, she felt really strange and no energy. She laid around for 3 days after this happen. She wants to know if she needs to be seen?

## 2020-09-01 NOTE — Telephone Encounter (Signed)
Spoke to pt who report last Thursday before getting out of bed, she felt like something briefly shocked her in the middle of her chest. She state she laid there for a few mins because she was scared she was going to fall. She also report she felt very tired and sick, but did not check BP or HR at the time. Pt also mentioned that she been experiencing SOB with exertion that has worsened.  Pt state she is now concerned and want to be evaluated just to make sure everything is ok.   Appointment scheduled for 5/4 at 2 pm with Dr. Loletha Grayer. Pt will also submit download for review. Pt made aware of ED precaution should any new symptoms develop or worsen. Will also forward to MD for any further evaluations.

## 2020-09-01 NOTE — Telephone Encounter (Signed)
She has a pacemaker, not ICD. Will review at the appt.

## 2020-09-03 ENCOUNTER — Other Ambulatory Visit: Payer: Self-pay | Admitting: Internal Medicine

## 2020-09-03 ENCOUNTER — Other Ambulatory Visit: Payer: Self-pay

## 2020-09-03 ENCOUNTER — Encounter: Payer: Self-pay | Admitting: Cardiovascular Disease

## 2020-09-03 ENCOUNTER — Ambulatory Visit (INDEPENDENT_AMBULATORY_CARE_PROVIDER_SITE_OTHER): Payer: Medicare Other | Admitting: Cardiovascular Disease

## 2020-09-03 VITALS — BP 188/87 | HR 71 | Ht 64.5 in | Wt 142.8 lb

## 2020-09-03 DIAGNOSIS — Z7901 Long term (current) use of anticoagulants: Secondary | ICD-10-CM

## 2020-09-03 DIAGNOSIS — I10 Essential (primary) hypertension: Secondary | ICD-10-CM | POA: Diagnosis not present

## 2020-09-03 DIAGNOSIS — E782 Mixed hyperlipidemia: Secondary | ICD-10-CM

## 2020-09-03 DIAGNOSIS — I4821 Permanent atrial fibrillation: Secondary | ICD-10-CM | POA: Diagnosis not present

## 2020-09-03 DIAGNOSIS — I5032 Chronic diastolic (congestive) heart failure: Secondary | ICD-10-CM | POA: Diagnosis not present

## 2020-09-03 DIAGNOSIS — Z950 Presence of cardiac pacemaker: Secondary | ICD-10-CM

## 2020-09-03 DIAGNOSIS — I700 Atherosclerosis of aorta: Secondary | ICD-10-CM

## 2020-09-03 DIAGNOSIS — I495 Sick sinus syndrome: Secondary | ICD-10-CM

## 2020-09-03 MED ORDER — DILTIAZEM HCL ER COATED BEADS 240 MG PO CP24
240.0000 mg | ORAL_CAPSULE | Freq: Every day | ORAL | 5 refills | Status: DC
Start: 2020-09-03 — End: 2021-03-02

## 2020-09-03 NOTE — Patient Instructions (Signed)
Medication Instructions:  INCREASE the Diltiazem to 240 mg once daily  *If you need a refill on your cardiac medications before your next appointment, please call your pharmacy*   Lab Work: None ordered If you have labs (blood work) drawn today and your tests are completely normal, you will receive your results only by: Marland Kitchen MyChart Message (if you have MyChart) OR . A paper copy in the mail If you have any lab test that is abnormal or we need to change your treatment, we will call you to review the results.   Testing/Procedures: None ordered   Follow-Up: At [facility], you and your health needs are our priority.  As part of our continuing mission to provide you with exceptional heart care, we have created designated Provider Care Teams.  These Care Teams include your primary Cardiologist (physician) and Advanced Practice Providers (APPs -  Physician Assistants and Nurse Practitioners) who all work together to provide you with the care you need, when you need it.  We recommend signing up for the patient portal called "MyChart".  Sign up information is provided on this After Visit Summary.  MyChart is used to connect with patients for Virtual Visits (Telemedicine).  Patients are able to view lab/test results, encounter notes, upcoming appointments, etc.  Non-urgent messages can be sent to your provider as well.   To learn more about what you can do with MyChart, go to [email or web address].    Your next appointment:   6 month(s)  The format for your next appointment:   In Person  Provider:   You may see Sanda Klein, MD or one of the following Advanced Practice Providers on your designated Care Team:    Almyra Deforest, PA-C  Fabian Sharp, [place] or   Roby Lofts, [place]    Other Instructions Dr. Sallyanne Kuster would like you to check your blood pressure daily for the next 2 weeks.  Keep a journal of these daily blood pressure and heart rate readings and call our office or send a  message through [place] with the results. Thank you!  It is best to check your BP 1-2 hours after taking your medications to see the medications effectiveness on your BP.    Here are some tips that our clinical pharmacists share for home BP monitoring:          Rest 10 minutes before taking your blood pressure.          Don't smoke or drink caffeinated beverages for at least 30 minutes before.          Take your blood pressure before (not after) you eat.          Sit comfortably with your back supported and both feet on the floor (don't cross your legs).          Elevate your arm to heart level on a table or a desk.          Use the proper sized cuff. It should fit smoothly and snugly around your bare upper arm. There should be enough room to slip a fingertip under the cuff. The bottom edge of the cuff should be 1 inch above the crease of the elbow.

## 2020-09-04 ENCOUNTER — Encounter: Payer: Self-pay | Admitting: Cardiovascular Disease

## 2020-09-04 NOTE — Progress Notes (Signed)
[name], female   DOB: 1927/10/28, 87 y.o.   MRN: [id number]    Cardiology Office Note    Date:  09/04/2020   [name], DOB Oct 02, 1927, MRN [id number]  PCP:  [name], MD  Cardiologist:   Sanda Klein, MD   No chief complaint on file.   History of Present Illness:  Misty Blackwell is a 87 y.o. female who presents for follow-up of permanent atrial fibrillation with slow ventricular response and pacemaker (Medtronic), remote TIA, HTN, minor CAD, asymptomatic pacemaker-detected NSVT, history of diastolic HF.  She has to be seen today after feeling a "electric shock" in her chest that occurred around 4:00 in the morning on 08/28/2020 and woke her from sleep.  She has been very anxious and her blood pressure has been elevated ever since.  Even before these events, her typical systolic blood pressure was never less than 150.  She has not had any problems with lower extremity edema.  She has not had any other episodes of chest discomfort either at rest or with exertion.  She denies shortness of breath.  She continues to live independently.  She has not had any falls or injuries.  She denies syncope, dizziness and is generally unaware of palpitations.  She is in permanent atrial fibrillation with mostly (80%) ventricular paced rhythm.  The underlying rhythm is atrial fibrillation with slow ventricular response at about 50-55 bpm.  Interrogation of her device shows normal function.  The device is a Medtronic Adapta dual-chamber pacemaker implanted in 2011 that has approximately 20 months of remaining longevity.  The dual-chamber device is now programmed VVIR for permanent atrial fibrillation.  The presenting rhythm is background atrial fibrillation with ventricular pacing at 70 bpm.  She has 80% ventricular pacing, which has been fairly steady percentage over the last several years.  Since her last device check she has had 3 episodes of high ventricular  rate, each one of them lasting for just a few seconds.  She had a 10 beat episode of nonsustained ventricular tachycardia on 05/02/2020, a 10-second episode of what is likely atrial fibrillation rapid ventricular response on 07/27/2020 and an 8 beat episode of nonsustained ventricular tachycardia that occurred on 08/30/2020 around 10:00 in the morning.  None of these coincide with her complaints of "shock in the chest" and all of them were asymptomatic.  In 2016 she underwent echocardiography and nuclear stress testing.  Left ventricular systolic function was normal, she had a fixed moderate apical defect but without reversible ischemia.  Moderate tricuspid regurgitation and moderate pulmonary artery hypertension with estimated systolic PA pressure of 58 mmHg.  In April 2019 she had a nuclear stress test showing normal perfusion and an echocardiogram which showed unchanged findings of mild LVH, normal left ventricular systolic function, mild left atrial dilation, improved degree of pulmonary hypertension 41 mmHg.   Past Medical History:  Diagnosis Date  . Anxiety   . Atrial fib/flutter, transient   . Atrial fibrillation, chronic ([place])   . CHF (congestive heart failure) ([place]) 10/29/2009   Echo - EF >55%; normal LV size and systolic function; unable to assess diastolic fcn due to E/A fusion, pulmonary vein flow pattern suggests elevated filling pressure; marked biatrail dilation, mild/mod tricuspid regurgitation; mod pulmonary htn; mild/mod mitral regurgitation; although echocardiographic features are incomplete findings suggest possible infiltrative cardiomyopathy (maybe amyloidosi  . Coronary artery disease 03/19/2002   R/P Cardiolite - EF 76%; nromal static and dynamic myocardial perfusion images; normal  wall motion and endocardial thickening in all vascular territories  . Dyspnea   . Dysrhythmia   . Facial numbness 12/26/2008   carotid doppler - R and L ICAs 0-49% diameter reduction (velocities  suggest low end of scale)  . Hypertension   . Pacemaker   . Peripheral neuropathy   . Pneumonia 2017  . Skin cancer    s/p surgical removal.  . Stroke ([place]) 04/2017  . TIA (transient ischemic attack)     Past Surgical History:  Procedure Laterality Date  . ABDOMINAL HYSTERECTOMY    . APPENDECTOMY    . BREAST LUMPECTOMY WITH RADIOACTIVE SEED LOCALIZATION Right 12/13/2019   Procedure: RIGHT BREAST LUMPECTOMY WITH RADIOACTIVE SEED LOCALIZATION;  Surgeon: Donnie Mesa, MD;  Location: [facility];  Service: General;  Laterality: Right;  LMA VS MAC  . BREAST SURGERY Left 1949  . CARDIAC CATHETERIZATION  08/06/2005   minimal coronary disease predominant RCA; no significant atherosclerosis; new onset sick sinus syndrome and atrial flutter w/ ventricular response, controlled on med therapy; systemic HTN, normal renal arteries  . CARDIOVERSION  11/19/2009   successful DCCV from AF to sinus type rhythm  . CHOLECYSTECTOMY    . EYE SURGERY Bilateral 2013  . Skin cancer resection    . TONSILLECTOMY  1938    Outpatient Medications Prior to Visit  Medication Sig Dispense Refill  . acidophilus (RISAQUAD) CAPS capsule Take 1 capsule by mouth daily.    Marland Kitchen anastrozole (ARIMIDEX) 1 MG tablet Take 1 tablet (1 mg total) by mouth daily. 90 tablet 3  . Ascorbic Acid (VITAMIN C) 1000 MG tablet Take 1,000 mg by mouth daily.     . Cholecalciferol (VITAMIN D3) 50 MCG (2000 UT) capsule Take by mouth.    . clotrimazole-betamethasone (LOTRISONE) cream Apply 1 application topically 2 (two) times daily. 15 g 2  . Cyanocobalamin (VITAMIN B 12 PO) Take 1,000 mcg by mouth daily.     . furosemide (LASIX) 20 MG tablet Take 1 tablet (20 mg total) by mouth daily. 30 tablet 11  . GNP IRON 200 (65 Fe) MG TABS Take by mouth.    . hydrOXYzine (ATARAX/VISTARIL) 50 MG tablet TAKE 1 TO 2 TABLETS BY MOUTH EVERY NIGHT 1 HOUR BEFORE BEDTIME AS NEEDED FOR SLEEP 60 tablet 1  . Magnesium 250 MG TABS Take 250 mg by mouth daily.    .  potassium chloride SA (KLOR-CON) 20 MEQ tablet Take  1 tablet  Daily  for Potassium 90 tablet 3  . telmisartan (MICARDIS) 80 MG tablet TAKE 1 TABLET(80 MG) BY MOUTH DAILY 90 tablet 3  . warfarin (COUMADIN) 3 MG tablet TAKE 1 TO 1 AND 1/2 TABLETS BY MOUTH AS DIRECTED 135 tablet 1  . zinc gluconate 50 MG tablet Take 50 mg by mouth daily.    Marland Kitchen diltiazem (CARDIZEM CD) 180 MG 24 hr capsule TAKE 1 CAPSULE BY MOUTH EVERY DAY 90 capsule 2   No facility-administered medications prior to visit.     Allergies:   Latex, Ace inhibitors, Augmentin [amoxicillin-pot clavulanate], Ciprofloxacin, Levaquin [levofloxacin in d5w], Zocor [simvastatin], Acrylic polymer [carbomer], Chocolate, and Gabapentin   Social History   Socioeconomic History  . Marital status: Widowed    Spouse name: Not on file  . Number of children: 0  . Years of education: Not on file  . Highest education level: Not on file  Occupational History  . Not on file  Tobacco Use  . Smoking status: Never Smoker  . Smokeless tobacco: Never Used  Vaping Use  . Vaping Use: Never used  Substance and Sexual Activity  . Alcohol use: No  . Drug use: No  . Sexual activity: Never  Other Topics Concern  . Not on file  Social History Narrative   Widowed.  Lives alone.  Ambulates independently.   Social Determinants of Health   Financial Resource Strain: Not on file  Food Insecurity: Not on file  Transportation Needs: Not on file  Physical Activity: Not on file  Stress: Not on file  Social Connections: Not on file     Family History:  The patient's family history includes Cirrhosis in her brother; Diabetes in her mother; Heart attack in her father; Heart disease in her father and mother.   ROS:   Please see the history of present illness.    ROS All other systems are reviewed and are negative.   PHYSICAL EXAM:   VS:  BP (!) 188/87   Pulse 71   Ht 5' 4.5" (1.638 m)   Wt 142 lb 12.8 oz (64.8 kg)   SpO2 97%   BMI 24.13 kg/m        General: Alert, oriented x3, no distress, healthy left subclavian pacemaker site Head: no evidence of trauma, PERRL, EOMI, no exophtalmos or lid lag, no myxedema, no xanthelasma; normal ears, nose and oropharynx Neck: normal jugular venous pulsations and no hepatojugular reflux; brisk carotid pulses without delay and no carotid bruits Chest: clear to auscultation, no signs of consolidation by percussion or palpation, normal fremitus, symmetrical and full respiratory excursions Cardiovascular: normal position and quality of the apical impulse, regular rhythm, normal first and paradoxically split second heart sounds, no murmurs, rubs or gallops Abdomen: no tenderness or distention, no masses by palpation, no abnormal pulsatility or arterial bruits, normal bowel sounds, no hepatosplenomegaly Extremities: no clubbing, cyanosis or edema; 2+ radial, ulnar and brachial pulses bilaterally; 2+ right femoral, posterior tibial and dorsalis pedis pulses; 2+ left femoral, posterior tibial and dorsalis pedis pulses; no subclavian or femoral bruits Neurological: grossly nonfocal Psych: Normal mood and affect   Wt Readings from Last 3 Encounters:  09/03/20 142 lb 12.8 oz (64.8 kg)  07/15/20 145 lb (65.8 kg)  06/26/20 144 lb (65.3 kg)      Studies/Labs Reviewed:   EKG:  EKG is not ordered today.  ECG from visit on 04/05/2020 shows coarse atrial fibrillation with occasional ventricular pacing 03/06/2020: BNP 144.8 04/10/2020: Magnesium 2.0; TSH 1.89 07/15/2020: ALT 10; BUN 22; Creat 0.70; Hemoglobin 14.0; Platelets 229; Potassium 4.2; Sodium 142   Lipid Panel    Component Value Date/Time   CHOL 153 04/10/2020 1359   TRIG 131 04/10/2020 1359   HDL 40 (L) 04/10/2020 1359   CHOLHDL 3.8 04/10/2020 1359   VLDL 34 (H) 09/28/2016 1125   LDLCALC 90 04/10/2020 1359     ASSESSMENT:    1. Permanent atrial fibrillation ([place])   2. Chronic diastolic heart failure ([place])   3. Essential hypertension   4.  Long term current use of anticoagulant therapy   5. Pacemaker   6. Hyperlipidemia, mixed   7. Atherosclerosis of aorta ([facility])      PLAN:  In order of problems listed above:  1. AFib with slow ventricular response, almost never has RVR and already has about 80% ventricular pacing.  History of  previous transient ischemic attack (CHADSVasc 8: age 38, TIA 2, HTN, CAD, CHF, gender).  2. CHF: She is taking furosemide 20 mg daily.  We have estimated her dry  weight to be around 143 pounds or less. 3. HTN:   Blood pressure remains elevated.  She is on maximum dose of telmisartan.  We are trying to avoid thiazide diuretics.  We will increase the diltiazem to 240 mg daily.  This will likely lead to even higher frequency of ventricular pacing, but thankfully this has not been associated with any evidence of left ventricular dysfunction or worsening heart failure. 4. Warfarin: Has had falls in the past, but not recently.  No serious bleeding problems. 5. PPM: She is not device dependent.  Normal pacemaker function.  Continue remote downloads every 3 months. 6. HLP: She did not tolerate more potent statins.  Her most recent LDL cholesterol on the current regimen is 90 mg/deciliter, which is acceptable in the absence of a clear history of coronary disease or PAD, although she does have imaging evidence of aortic atherosclerosis.  7. Chest pain: I am not sure what caused her episode of "electric shock in the chest" but it does not sound like angina pectoris or true arrhythmia and no arrhythmia was detected by her device at that time.  Anxiety seems to be a big part of it.  I am not sure further investigation would be fruitful.   Medication Adjustments/Labs and Tests Ordered: Current medicines are reviewed at length with the patient today.  Concerns regarding medicines are outlined above.  Medication changes, Labs and Tests ordered today are listed in the Patient Instructions below. Patient Instructions   Medication Instructions:  INCREASE the Diltiazem to 240 mg once daily  *If you need a refill on your cardiac medications before your next appointment, please call your pharmacy*   Lab Work: None ordered If you have labs (blood work) drawn today and your tests are completely normal, you will receive your results only by: Marland Kitchen MyChart Message (if you have MyChart) OR . A paper copy in the mail If you have any lab test that is abnormal or we need to change your treatment, we will call you to review the results.   Testing/Procedures: None ordered   Follow-Up: At [facility], you and your health needs are our priority.  As part of our continuing mission to provide you with exceptional heart care, we have created designated Provider Care Teams.  These Care Teams include your primary Cardiologist (physician) and Advanced Practice Providers (APPs -  Physician Assistants and Nurse Practitioners) who all work together to provide you with the care you need, when you need it.  We recommend signing up for the patient portal called "MyChart".  Sign up information is provided on this After Visit Summary.  MyChart is used to connect with patients for Virtual Visits (Telemedicine).  Patients are able to view lab/test results, encounter notes, upcoming appointments, etc.  Non-urgent messages can be sent to your provider as well.   To learn more about what you can do with MyChart, go to [email or web address].    Your next appointment:   6 month(s)  The format for your next appointment:   In Person  Provider:   You may see Sanda Klein, MD or one of the following Advanced Practice Providers on your designated Care Team:    Almyra Deforest, PA-C  Fabian Sharp, [place] or   Roby Lofts, [place]    Other Instructions Dr. Sallyanne Kuster would like you to check your blood pressure daily for the next 2 weeks.  Keep a journal of these daily blood pressure and heart rate readings and call our office or send  a  message through MyChart with the results. Thank you!  It is best to check your BP 1-2 hours after taking your medications to see the medications effectiveness on your BP.    Here are some tips that our clinical pharmacists share for home BP monitoring:          Rest 10 minutes before taking your blood pressure.          Don't smoke or drink caffeinated beverages for at least 30 minutes before.          Take your blood pressure before (not after) you eat.          Sit comfortably with your back supported and both feet on the floor (don't cross your legs).          Elevate your arm to heart level on a table or a desk.          Use the proper sized cuff. It should fit smoothly and snugly around your bare upper arm. There should be enough room to slip a fingertip under the cuff. The bottom edge of the cuff should be 1 inch above the crease of the elbow.        Signed, Sanda Klein, MD  09/04/2020 8:44 PM    [facility] [address] [place], [place], [place]  [address] Phone: [phone]; Fax: [phone]

## 2020-09-15 ENCOUNTER — Ambulatory Visit (INDEPENDENT_AMBULATORY_CARE_PROVIDER_SITE_OTHER): Payer: Medicare Other

## 2020-09-15 ENCOUNTER — Other Ambulatory Visit: Payer: Self-pay

## 2020-09-15 DIAGNOSIS — Z5181 Encounter for therapeutic drug level monitoring: Secondary | ICD-10-CM | POA: Diagnosis not present

## 2020-09-15 DIAGNOSIS — Z7901 Long term (current) use of anticoagulants: Secondary | ICD-10-CM

## 2020-09-15 DIAGNOSIS — I482 Chronic atrial fibrillation, unspecified: Secondary | ICD-10-CM

## 2020-09-15 LAB — POCT INR: INR: 2.2 (ref 2.0–3.0)

## 2020-09-15 NOTE — Patient Instructions (Signed)
Continue taking 1.5 tablets daily except 1 tablet Sunday, Tuesday and Thursday. Repeat INR 6 weeks. Call if you have questions [phone] (Kristin/Raquel)

## 2020-10-06 ENCOUNTER — Other Ambulatory Visit: Payer: Self-pay | Admitting: Adult Health Nurse Practitioner

## 2020-10-06 DIAGNOSIS — F5101 Primary insomnia: Secondary | ICD-10-CM

## 2020-10-27 ENCOUNTER — Other Ambulatory Visit: Payer: Self-pay

## 2020-10-27 ENCOUNTER — Ambulatory Visit (INDEPENDENT_AMBULATORY_CARE_PROVIDER_SITE_OTHER): Payer: Medicare Other | Admitting: Pharmacist Clinician (PhC)/ Clinical Pharmacy Specialist

## 2020-10-27 DIAGNOSIS — I482 Chronic atrial fibrillation, unspecified: Secondary | ICD-10-CM | POA: Diagnosis not present

## 2020-10-27 DIAGNOSIS — Z7901 Long term (current) use of anticoagulants: Secondary | ICD-10-CM | POA: Diagnosis not present

## 2020-10-27 LAB — POCT INR: INR: 2.1 (ref 2.0–3.0)

## 2020-10-29 ENCOUNTER — Encounter: Payer: Self-pay | Admitting: Internal Medicine

## 2020-10-29 NOTE — Progress Notes (Signed)
Future Appointments  Date Time Provider [place]  10/30/2020 10:30 AM [name], MD [facility] None  01/13/2021  2:30 PM [name], MD [facility] None  02/19/2021  3:00 PM [name], MD [facility] None  04/28/2021  -  CPE  2:00 PM [name], MD [facility] None  06/25/2021  9:30 AM [name], MD [facility] None  07/15/2021  - Wellness  9:30 AM Liane Comber, NP [facility] None    History of Present Illness:       This very nice 87 y.o. [name] presents for 6 month follow up with HTN, HLD, Pre-Diabetes and Vitamin D Deficiency.        Patient is treated for HTN & BP has been controlled at home. Today's BP was initially elevated & rechecked at goal - 134/66. Patient has had no complaints of any cardiac type chest pain, palpitations, dyspnea Vertell Limber /PND, dizziness, claudication, or dependent edema.       Hyperlipidemia is controlled with diet & meds. Patient denies myalgias or other med SE's. Last Lipids were not at goal:  Lab Results  Component Value Date   CHOL 181 10/30/2020   HDL 48 (L) 10/30/2020   LDLCALC 108 (H) 10/30/2020   TRIG 130 10/30/2020   CHOLHDL 3.8 10/30/2020    Also, the patient has history of T2_NIDDM PreDiabetes and has had no symptoms of reactive hypoglycemia, diabetic polys, paresthesias or visual blurring.  Last A1c was   Lab Results  Component Value Date   HGBA1C 5.6 10/30/2020       Further, the patient also has history of Vitamin D Deficiency and supplements vitamin D without any suspected side-effects. Last vitamin D was at goal:   Lab Results  Component Value Date   VD25OH 62 10/30/2020     Current Outpatient Medications on File Prior to Visit  Medication Sig   acidophilus (RISAQUAD) CAPS capsule Take 1 capsule by mouth daily.   anastrozole (ARIMIDEX) 1 MG tablet Take 1 tablet (1 mg total) by mouth daily.   Ascorbic Acid (VITAMIN C) 1000 MG tablet Take 1,000 mg by mouth daily.     Cholecalciferol (VITAMIN D3) 50 MCG (2000 UT) capsule Take by mouth.   clotrimazole-betamethasone (LOTRISONE) cream Apply 1 application topically 2 (two) times daily.   Cyanocobalamin (VITAMIN B 12 PO) Take 1,000 mcg by mouth daily.    diltiazem (CARDIZEM CD) 240 MG 24 hr capsule Take 1 capsule (240 mg total) by mouth daily.   furosemide (LASIX) 20 MG tablet Take 1 tablet (20 mg total) by mouth daily.   GNP IRON 200 (65 Fe) MG TABS Take by mouth.   hydrOXYzine (ATARAX/VISTARIL) 50 MG tablet TAKE 1 TO 2 TABLETS BY MOUTH EVERY NIGHT 1 HOUR BEFORE BEDTIME AS NEEDED FOR SLEEP   Magnesium 250 MG TABS Take 250 mg by mouth daily.   potassium chloride SA (KLOR-CON) 20 MEQ tablet Take  1 tablet  Daily  for Potassium   telmisartan 80 MG tablet TAKE 1 TABLET  DAILY   warfarin  3 MG tablet TAKE 1 TO 1 AND 1/2 TABLETS AS DIRECTED   zinc gluconate 50 MG tablet Take 5 daily.   No current facility-administered medications on file prior to visit.     Allergies  Allergen Reactions   Latex Itching   Ace Inhibitors Other (See Comments)    Unknown reaction   Augmentin [Amoxicillin-Pot Clavulanate] Other (See Comments)   Ciprofloxacin Other (See Comments)   Levaquin [Levofloxacin In  D5w] Other (See Comments)   Zocor [Simvastatin] Other (See Comments)   Acrylic Polymer [Carbomer] Itching   Chocolate Other (See Comments)    migraine's    Gabapentin Other (See Comments)    Unsteady gait      PMHx:   Past Medical History:  Diagnosis Date   Anxiety    Atrial fib/flutter, transient    Atrial fibrillation, chronic (HCC)    CHF (congestive heart failure) ([place]) 10/29/2009   Echo - EF >55%; normal LV size and systolic function; unable to assess diastolic fcn due to E/A fusion, pulmonary vein flow pattern suggests elevated filling pressure; marked biatrail dilation, mild/mod tricuspid regurgitation; mod pulmonary htn; mild/mod mitral regurgitation; although echocardiographic features are incomplete findings  suggest possible infiltrative cardiomyopathy (maybe amyloidosi   Coronary artery disease 03/19/2002   R/P Cardiolite - EF 76%; nromal static and dynamic myocardial perfusion images; normal wall motion and endocardial thickening in all vascular territories   Dyspnea    Dysrhythmia    Facial numbness 12/26/2008   carotid doppler - R and L ICAs 0-49% diameter reduction (velocities suggest low end of scale)   Hypertension    Pacemaker    Peripheral neuropathy    Pneumonia 2017   Skin cancer    s/p surgical removal.   Stroke ([place]) 04/2017   TIA (transient ischemic attack)     Immunization History  Administered Date(s) Administered   DT (Pediatric) 03/19/2015   Influenza Split 05/04/2011   Influenza, High Dose  1 02/03/2017, 12/26/2018, 01/28/2020   Influenza,inj,quad,  05/21/2013   Influenza 02/04/2015, 02/03/2017, 01/28/2020   PFIZER  SARS-COV-2 Vacc 06/16/2019, 07/09/2019, 03/21/2020   Pneumococcal - 13 03/19/2014   Pneumococcal - 23 05/04/2011   Pneumococcal - 23 05/03/2001   Td 05/04/2003     Past Surgical History:  Procedure Laterality Date   ABDOMINAL HYSTERECTOMY     APPENDECTOMY     BREAST LUMPECTOMY WITH RADIOACTIVE SEED LOCALIZATION Right 12/13/2019   Procedure: RIGHT BREAST LUMPECTOMY WITH RADIOACTIVE SEED LOCALIZATION;  Surgeon: Donnie Mesa, MD;  Location: [facility];  Service: General;  Laterality: Right;  LMA VS MAC   BREAST SURGERY Left 1949   CARDIAC CATHETERIZATION  08/06/2005   minimal coronary disease predominant RCA; no significant atherosclerosis; new onset sick sinus syndrome and atrial flutter w/ ventricular response, controlled on med therapy; systemic HTN, normal renal arteries   CARDIOVERSION  11/19/2009   successful DCCV from AF to sinus type rhythm   CHOLECYSTECTOMY     EYE SURGERY Bilateral 2013   Skin cancer resection     TONSILLECTOMY  1938    FHx:    Reviewed / unchanged  SHx:    Reviewed / unchanged   Systems Review:  Constitutional: Denies  fever, chills, wt changes, headaches, insomnia, fatigue, night sweats, change in appetite. Eyes: Denies redness, blurred vision, diplopia, discharge, itchy, watery eyes.  ENT: Denies discharge, congestion, post nasal drip, epistaxis, sore throat, earache, hearing loss, dental pain, tinnitus, vertigo, sinus pain, snoring.  CV: Denies chest pain, palpitations, irregular heartbeat, syncope, dyspnea, diaphoresis, orthopnea, PND, claudication or edema. Respiratory: denies cough, dyspnea, DOE, pleurisy, hoarseness, laryngitis, wheezing.  Gastrointestinal: Denies dysphagia, odynophagia, heartburn, reflux, water brash, abdominal pain or cramps, nausea, vomiting, bloating, diarrhea, constipation, hematemesis, melena, hematochezia  or hemorrhoids. Genitourinary: Denies dysuria, frequency, urgency, nocturia, hesitancy, discharge, hematuria or flank pain. Musculoskeletal: Denies arthralgias, myalgias, stiffness, jt. swelling, pain, limping or strain/sprain.  Skin: Denies pruritus, rash, hives, warts, acne, eczema or change in skin lesion(s). Neuro:  No weakness, tremor, incoordination, spasms, paresthesia or pain. Psychiatric: Denies confusion, memory loss or sensory loss. Endo: Denies change in weight, skin or hair change.  Heme/Lymph: No excessive bleeding, bruising or enlarged lymph nodes.  Physical Exam  BP 134/66   Pulse 73   Temp (!) 97.5 F (36.4 C)   Resp 17   Ht _0  (1.626 m)   Wt 144 lb 3.2 oz (65.4 kg)   SpO2 99%   BMI 24.75 kg/m   Appears  well nourished, well groomed  and in no distress.  Eyes: PERRLA, EOMs, conjunctiva no swelling or erythema. Sinuses: No frontal/maxillary tenderness ENT/Mouth: EAC's clear, TM's nl w/o erythema, bulging. Nares clear w/o erythema, swelling, exudates. Oropharynx clear without erythema or exudates. Oral hygiene is good. Tongue normal, non obstructing. Hearing intact.  Neck: Supple. Thyroid not palpable. Car 2+/2+ without bruits, nodes or JVD. Chest:  Respirations nl with BS clear & equal w/o rales, rhonchi, wheezing or stridor.  Cor: Heart sounds normal w/ regular rate and rhythm without sig. murmurs, gallops, clicks or rubs. Peripheral pulses normal and equal  without edema.  Abdomen: Soft & bowel sounds normal. Non-tender w/o guarding, rebound, hernias, masses or organomegaly.  Lymphatics: Unremarkable.  Musculoskeletal: Full ROM all peripheral extremities, joint stability, 5/5 strength and normal gait.  Skin: Warm, dry without exposed rashes, lesions or ecchymosis apparent.  Neuro: Cranial nerves intact, reflexes equal bilaterally. Sensory-motor testing grossly intact. Tendon reflexes grossly intact.  Pysch: Alert & oriented x 3.  Insight and judgement nl & appropriate. No ideations.  Assessment and Plan:  1. Essential hypertension  - Continue medication, monitor blood pressure at home.  - Continue DASH diet.  Reminder to go to the ER if any CP,  SOB, nausea, dizziness, severe HA, changes vision/speech.   - CBC with Differential/Platelet - COMPLETE METABOLIC PANEL WITH GFR - Magnesium - TSH  2. Hyperlipidemia, mixed  - Continue diet/meds, exercise,& lifestyle modifications.  - Continue monitor periodic cholesterol/liver & renal functions    - Lipid panel - TSH  3. Abnormal glucose  - Continue diet, exercise  - Lifestyle modifications.  - Monitor appropriate labs   - Hemoglobin A1c  4. Vitamin D deficiency  - Continue supplementation.   5. Chronic atrial fibrillation (HCC)  - TSH  6. Pacemaker  7. Medication management  - CBC with Differential/Platelet - COMPLETE METABOLIC PANEL WITH GFR - Magnesium - Lipid panel - TSH - Hemoglobin A1c - Insulin, random - VITAMIN D 25 Hydroxy        Discussed  regular exercise, BP monitoring, weight control to achieve/maintain BMI less than 25 and discussed med and SE's. Recommended labs to assess and monitor clinical status with further disposition pending results of  labs.  I discussed the assessment and treatment plan with the patient. The patient was provided an opportunity to ask questions and all were answered. The patient agreed with the plan and demonstrated an understanding of the instructions.  I provided over 30 minutes of exam, counseling, chart review and  complex critical decision making.         The patient was advised to call back or seek an in-person evaluation if the symptoms worsen or if the condition fails to improve as anticipated.   Kirtland Bouchard, MD

## 2020-10-29 NOTE — Patient Instructions (Addendum)

## 2020-10-30 ENCOUNTER — Ambulatory Visit (INDEPENDENT_AMBULATORY_CARE_PROVIDER_SITE_OTHER): Payer: Medicare Other | Admitting: Internal Medicine

## 2020-10-30 ENCOUNTER — Other Ambulatory Visit: Payer: Self-pay

## 2020-10-30 ENCOUNTER — Encounter: Payer: Self-pay | Admitting: Internal Medicine

## 2020-10-30 VITALS — BP 134/66 | HR 73 | Temp 97.5°F | Resp 17 | Ht 64.0 in | Wt 144.2 lb

## 2020-10-30 DIAGNOSIS — E782 Mixed hyperlipidemia: Secondary | ICD-10-CM

## 2020-10-30 DIAGNOSIS — E559 Vitamin D deficiency, unspecified: Secondary | ICD-10-CM

## 2020-10-30 DIAGNOSIS — Z79899 Other long term (current) drug therapy: Secondary | ICD-10-CM

## 2020-10-30 DIAGNOSIS — I10 Essential (primary) hypertension: Secondary | ICD-10-CM

## 2020-10-30 DIAGNOSIS — Z950 Presence of cardiac pacemaker: Secondary | ICD-10-CM

## 2020-10-30 DIAGNOSIS — R7309 Other abnormal glucose: Secondary | ICD-10-CM | POA: Diagnosis not present

## 2020-10-30 DIAGNOSIS — I482 Chronic atrial fibrillation, unspecified: Secondary | ICD-10-CM

## 2020-10-31 LAB — COMPLETE METABOLIC PANEL WITH GFR
AG Ratio: 1.7 (calc) (ref 1.0–2.5)
ALT: 13 U/L (ref 6–29)
AST: 16 U/L (ref 10–35)
Albumin: 4.5 g/dL (ref 3.6–5.1)
Alkaline phosphatase (APISO): 127 U/L (ref 37–153)
BUN: 22 mg/dL (ref 7–25)
CO2: 32 mmol/L (ref 20–32)
Calcium: 9.9 mg/dL (ref 8.6–10.4)
Chloride: 101 mmol/L (ref 98–110)
Creat: 0.74 mg/dL (ref 0.60–0.88)
GFR, Est African American: 82 mL/min/{1.73_m2} (ref >=60)
GFR, Est Non African American: 71 mL/min/{1.73_m2} (ref >=60)
Globulin: 2.7 g/dL (calc) (ref 1.9–3.7)
Glucose, Bld: 85 mg/dL (ref 65–99)
Potassium: 4.8 mmol/L (ref 3.5–5.3)
Sodium: 141 mmol/L (ref 135–146)
Total Bilirubin: 0.6 mg/dL (ref 0.2–1.2)
Total Protein: 7.2 g/dL (ref 6.1–8.1)

## 2020-10-31 LAB — CBC WITH DIFFERENTIAL/PLATELET
Absolute Monocytes: 846 cells/uL (ref 200–950)
Basophils Absolute: 74 cells/uL (ref 0–200)
Basophils Relative: 0.8 %
Eosinophils Absolute: 212 cells/uL (ref 15–500)
Eosinophils Relative: 2.3 %
HCT: 42.9 % (ref 35.0–45.0)
Hemoglobin: 14.2 g/dL (ref 11.7–15.5)
Lymphs Abs: 2742 cells/uL (ref 850–3900)
MCH: 29.8 pg (ref 27.0–33.0)
MCHC: 33.1 g/dL (ref 32.0–36.0)
MCV: 89.9 fL (ref 80.0–100.0)
MPV: 10.5 fL (ref 7.5–12.5)
Monocytes Relative: 9.2 %
Neutro Abs: 5327 cells/uL (ref 1500–7800)
Neutrophils Relative %: 57.9 %
Platelets: 250 10*3/uL (ref 140–400)
RBC: 4.77 10*6/uL (ref 3.80–5.10)
RDW: 12.3 % (ref 11.0–15.0)
Total Lymphocyte: 29.8 %
WBC: 9.2 10*3/uL (ref 3.8–10.8)

## 2020-10-31 LAB — HEMOGLOBIN A1C
Hgb A1c MFr Bld: 5.6 % of total Hgb (ref <5.7)
Mean Plasma Glucose: 114 mg/dL
eAG (mmol/L): 6.3 mmol/L

## 2020-10-31 LAB — LIPID PANEL
Cholesterol: 181 mg/dL (ref <200)
HDL: 48 mg/dL — ABNORMAL LOW (ref >=50)
LDL Cholesterol (Calc): 108 mg/dL (calc) — ABNORMAL HIGH
Non-HDL Cholesterol (Calc): 133 mg/dL (calc) — ABNORMAL HIGH (ref <130)
Total CHOL/HDL Ratio: 3.8 (calc) (ref <5.0)
Triglycerides: 130 mg/dL (ref <150)

## 2020-10-31 LAB — VITAMIN D 25 HYDROXY (VIT D DEFICIENCY, FRACTURES): Vit D, 25-Hydroxy: 62 ng/mL (ref 30–100)

## 2020-10-31 LAB — MAGNESIUM: Magnesium: 2.2 mg/dL (ref 1.5–2.5)

## 2020-10-31 LAB — INSULIN, RANDOM: Insulin: 7.5 u[IU]/mL

## 2020-10-31 LAB — TSH: TSH: 1.86 mIU/L (ref 0.40–4.50)

## 2020-11-01 NOTE — Progress Notes (Signed)
============================================================ -   Test results slightly outside the reference range are not unusual. If there is anything important, I will review this with you,  otherwise it is considered normal test values.  If you have further questions,  please do not hesitate to contact me at the office or via My Chart.  ============================================================ ============================================================  -  Total Chol = 181  Excellent   - Very low risk for Heart Attack  / Stroke ============================================================ ============================================================  -  A1c - Normal - Great - No Diabetes ! ============================================================ ============================================================  -  Vitamin D = 62 - Excellent  ! ============================================================ ============================================================  -  All Else - CBC - Kidneys - Electrolytes - Liver - Magnesium & Thyroid    - all  Normal / OK ============================================================ ============================================================

## 2020-11-02 ENCOUNTER — Encounter: Payer: Self-pay | Admitting: Internal Medicine

## 2020-11-07 ENCOUNTER — Ambulatory Visit (INDEPENDENT_AMBULATORY_CARE_PROVIDER_SITE_OTHER): Payer: Medicare Other

## 2020-11-07 DIAGNOSIS — I495 Sick sinus syndrome: Secondary | ICD-10-CM

## 2020-11-09 LAB — CUP PACEART REMOTE DEVICE CHECK
Battery Impedance: 4334 Ohm
Battery Remaining Longevity: 15 mo
Battery Voltage: 2.69 V
Brady Statistic RV Percent Paced: 91 %
Date Time Interrogation Session: 20220708081404
Implantable Lead Implant Date: 20110428
Implantable Lead Implant Date: 20110428
Implantable Lead Location: 753859
Implantable Lead Location: 753860
Implantable Lead Model: 4092
Implantable Lead Model: 4592
Implantable Pulse Generator Implant Date: 20110428
Lead Channel Impedance Value: 67 Ohm
Lead Channel Impedance Value: 773 Ohm
Lead Channel Pacing Threshold Amplitude: 0.875 V
Lead Channel Pacing Threshold Pulse Width: 0.4 ms
Lead Channel Setting Pacing Amplitude: 2.5 V
Lead Channel Setting Pacing Pulse Width: 0.4 ms
Lead Channel Setting Sensing Sensitivity: 4 mV

## 2020-11-25 ENCOUNTER — Other Ambulatory Visit: Payer: Self-pay

## 2020-11-25 DIAGNOSIS — Z1211 Encounter for screening for malignant neoplasm of colon: Secondary | ICD-10-CM

## 2020-11-25 LAB — POC HEMOCCULT BLD/STL (HOME/3-CARD/SCREEN)
Card #2 Fecal Occult Blod, POC: NEGATIVE
Card #3 Fecal Occult Blood, POC: NEGATIVE
Fecal Occult Blood, POC: NEGATIVE

## 2020-11-28 NOTE — Progress Notes (Signed)
Remote pacemaker transmission.   

## 2020-12-01 ENCOUNTER — Other Ambulatory Visit: Payer: Self-pay

## 2020-12-01 ENCOUNTER — Ambulatory Visit
Admission: RE | Admit: 2020-12-01 | Discharge: 2020-12-01 | Disposition: A | Discharge status: Home / Self Care | Payer: Medicare Other | Source: Ambulatory Visit | Attending: Hematology and Oncology | Admitting: Hematology and Oncology

## 2020-12-01 DIAGNOSIS — Z170 Estrogen receptor positive status [ER+]: Secondary | ICD-10-CM

## 2020-12-01 DIAGNOSIS — C50411 Malignant neoplasm of upper-outer quadrant of right female breast: Secondary | ICD-10-CM

## 2020-12-08 ENCOUNTER — Ambulatory Visit (INDEPENDENT_AMBULATORY_CARE_PROVIDER_SITE_OTHER): Payer: Medicare Other

## 2020-12-08 ENCOUNTER — Other Ambulatory Visit: Payer: Self-pay

## 2020-12-08 DIAGNOSIS — I482 Chronic atrial fibrillation, unspecified: Secondary | ICD-10-CM | POA: Diagnosis not present

## 2020-12-08 DIAGNOSIS — Z7901 Long term (current) use of anticoagulants: Secondary | ICD-10-CM | POA: Diagnosis not present

## 2020-12-08 LAB — POCT INR: INR: 2.3 (ref 2.0–3.0)

## 2020-12-08 NOTE — Patient Instructions (Signed)
Continue taking 1.5 tablets daily except 1 tablet Sunday, Tuesday and Thursday. Repeat INR 6 weeks. Call if you have questions [phone] (Kristin/Raquel)

## 2021-01-12 ENCOUNTER — Encounter: Payer: Self-pay | Admitting: Internal Medicine

## 2021-01-12 DIAGNOSIS — I700 Atherosclerosis of aorta: Secondary | ICD-10-CM | POA: Insufficient documentation

## 2021-01-12 NOTE — Progress Notes (Signed)
Future Appointments  Date Time Provider [place]  01/13/2021  2:30 PM [name], MD [facility] None  02/19/2021  3:00 PM [name], MD [facility] None  04/28/2021    - CPE   2:00 PM [name], MD [facility] None  06/25/2021  9:30 AM [name], MD [facility] None  07/15/2021     - Wellness  9:30 AM Liane Comber, NP [facility] None    History of Present Illness:       This very nice 87 y.o.[name] presents for 6 month follow up with HTN, HLD, Pre-Diabetes and Vitamin D Deficiency.        Patient is treated for HTN (1998) & BP has been controlled at home. Today's BP is at goal -  138/80.  Heart caths in 1993/2007 showed non-obstructive CAD. In 2006, she was dx'd w/AFib after an embolic CVA.  In 2007, she had a PPM implanted for SSS.  She's on Coumadin followed by the [facility] coumadin clinic. Patient has had no complaints of any cardiac type chest pain, palpitations, dyspnea / orthopnea / PND, dizziness, claudication, or dependent edema. 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        Hyperlipidemia is controlled with diet & meds. Patient denies myalgias or other med SE's. Last Lipids off meds were not at goal:  Lab Results  Component Value Date   CHOL 181 10/30/2020   HDL 48 (L) 10/30/2020   LDLCALC 108 (H) 10/30/2020   TRIG 130 10/30/2020   CHOLHDL 3.8 10/30/2020    Also, the patient has history of PreDiabetes with Insulin Resistance (A1c 5.9% /Insulin elev 86 /2011) and has had no symptoms of reactive hypoglycemia, diabetic polys, paresthesias or visual blurring.  Last A1c was normal & at goal:  Lab Results  Component Value Date   HGBA1C 5.6 10/30/2020                                                     Further, the patient also has history of Vitamin D Deficiency and supplements vitamin D without any suspected side-effects. Last vitamin D was at goal:   Lab Results  Component Value  Date   VD25OH 62 10/30/2020     Current Outpatient Medications on File Prior to Visit  Medication Sig   acidophilus (RISAQUAD) CAPS capsule Take 1 capsule  daily.   anastrozole (ARIMIDEX) 1 MG tablet Take 1 tablet  daily.   Ascorbic Acid  1000 MG tablet Take daily.    VITAMIN D 2000 u Take ,,,,,,,,,,,,,,,,,,,,,,,,,,,,,,,,,,,,,,,,,,,,,,,,,,,,,,,,,,,,,,,,,,,,,,,,,,,,,,,,,,Annett Fabian   clotrimazole-betamethasone (LOTRISONE) cream Apply 1 application topically 2 (two) times daily.   Cyanocobalamin (VITAMIN B 12 PO) Take 1,000 mcg by mouth daily.    diltiazem (CARDIZEM CD) 240 MG 24 hr capsule Take 1 capsulen  j     daily.   furosemide (LASIX) 20 MG tablet Take 1 tablet  daily.   GNP IRON 200 (65 Fe) MG TABS Take    hydrOXYzine 50 MG tablet TAKE 1 TO 2 TABLETS EVERY NIGHT 1 HR BEFORE BEDTIME    Magnesium 250 MG TABS Take daily.   potassium chloride 20 MEQ tablet Take  1 tablet  Daily  for Potassium   telmisartan 80 MG tablet TAKE 1 TABLET  DAILY   warfarin (COUMADIN) 3 MG tablet TAKE 1 TO 1 AND 1/2 TABLETS AS DIRECTED   zinc 50 MG tablet Take  daily.      Allergies  Allergen Reactions   Latex Itching   Ace Inhibitors Other (See Comments)    Unknown reaction   Augmentin [Amoxicillin-Pot Clavulanate] Other (See Comments)   Ciprofloxacin Other (See Comments)   Levaquin [Levofloxacin In D5w] Other (See Comments)   Zocor [Simvastatin] Other (See Comments)   Acrylic Polymer [Carbomer] Itching   Chocolate Other (See Comments)    migraine's    Gabapentin Other (See Comments)    Unsteady gait      PMHx:   Past Medical History:  Diagnosis Date   Anxiety    Atrial fib/flutter, transient    Atrial fibrillation, chronic (HCC)    CHF (congestive heart failure) ([place]) 10/29/2009   Echo - EF >55%; normal LV size and systolic function; unable to assess diastolic fcn due to E/A  fusion, pulmonary vein flow pattern suggests elevated filling pressure; marked biatrail dilation, mild/mod tricuspid regurgitation; mod pulmonary htn; mild/mod mitral regurgitation; although echocardiographic features are incomplete findings suggest possible infiltrative cardiomyopathy (maybe amyloidosi   Coronary artery disease 03/19/2002   R/P Cardiolite - EF 76%; nromal static and dynamic myocardial perfusion images; normal wall motion and endocardial thickening in all vascular territories   Dyspnea    Dysrhythmia    Facial numbness 12/26/2008   carotid doppler - R and L ICAs 0-49% diameter reduction (velocities suggest low end of scale)   Hypertension    Pacemaker    Peripheral neuropathy    Pneumonia 2017   Skin cancer    s/p surgical removal.   Stroke ([place]) 04/2017   TIA (transient ischemic attack)      Immunization History  Administered Date(s) Administered   DT  03/19/2015   Influenza Split 05/04/2011   Influenza, High Dose  03/19/2014, 12/23/2015, 02/03/2017, 12/26/2018, 01/28/2020   Influenza,inj,quad 05/21/2013   Influenza-  02/04/2015, 02/03/2017, 01/28/2020   PFIZER SARS-COV-2 Vacc 06/16/2019, 07/09/2019, 03/21/2020   Pneumococcal -13 03/19/2014   Pneumococcal -23 05/04/2011   Pneumococcal-23 05/03/2001   Td 05/04/2003     Past Surgical History:  Procedure Laterality Date   ABDOMINAL HYSTERECTOMY     APPENDECTOMY     BREAST LUMPECTOMY WITH RADIOACTIVE SEED LOCALIZATION Right 12/13/2019   Procedure: RIGHT BREAST LUMPECTOMY WITH RADIOACTIVE SEED LOCALIZATION;  Surgeon: Donnie Mesa, MD;  Location: [facility];  Service: General;  Laterality: Right;  LMA VS MAC   BREAST SURGERY Left 1949   CARDIAC CATHETERIZATION  08/06/2005   minimal coronary disease predominant RCA; no significant atherosclerosis; new onset sick sinus syndrome and atrial flutter w/ ventricular response, controlled on med therapy; systemic HTN, normal renal arteries   CARDIOVERSION  11/19/2009    successful DCCV from AF to sinus type rhythm   CHOLECYSTECTOMY     EYE SURGERY Bilateral 2013   Skin cancer resection     TONSILLECTOMY  1938    FHx:  Reviewed / unchanged  SHx:    Reviewed / unchanged   Systems Review:  Constitutional: Denies fever, chills, wt changes, headaches, insomnia, fatigue, night sweats, change in appetite. Eyes: Denies redness, blurred vision, diplopia, discharge, itchy, watery eyes.  ENT: Denies discharge, congestion, post nasal drip, epistaxis, sore throat, earache, hearing loss, dental pain, tinnitus, vertigo, sinus pain, snoring.  CV: Denies chest pain, palpitations, irregular heartbeat, syncope, dyspnea, diaphoresis, orthopnea, PND, claudication or edema. Respiratory: denies cough, dyspnea, DOE, pleurisy, hoarseness, laryngitis, wheezing.  Gastrointestinal: Denies dysphagia, odynophagia, heartburn, reflux, water brash, abdominal pain or cramps, nausea, vomiting, bloating, diarrhea, constipation, hematemesis, melena, hematochezia  or hemorrhoids. Genitourinary: Denies dysuria, frequency, urgency, nocturia, hesitancy, discharge, hematuria or flank pain. Musculoskeletal: Denies arthralgias, myalgias, stiffness, jt. swelling, pain, limping or strain/sprain.  Skin: Denies pruritus, rash, hives, warts, acne, eczema or change in skin lesion(s). Neuro: No weakness, tremor, incoordination, spasms, paresthesia or pain. Psychiatric: Denies confusion, memory loss or sensory loss. Endo: Denies change in weight, skin or hair change.  Heme/Lymph: No excessive bleeding, bruising or enlarged lymph nodes.  Physical Exam  BP 138/80   Pulse 93   Temp 97.6 F (36.4 C)   Resp 16   Ht _0  (1.626 m)   Wt 144 lb 9.6 oz (65.6 kg)   SpO2 97%   BMI 24.82 kg/m   Appears  well nourished, well groomed  and in no distress.  Eyes: PERRLA, EOMs, conjunctiva no swelling or erythema. Sinuses: No frontal/maxillary tenderness ENT/Mouth: EAC's clear, TM's nl w/o erythema,  bulging. Nares clear w/o erythema, swelling, exudates. Oropharynx clear without erythema or exudates. Oral hygiene is good. Tongue normal, non obstructing. Hearing intact.  Neck: Supple. Thyroid not palpable. Car 2+/2+ without bruits, nodes or JVD. Chest: Respirations nl with BS clear & equal w/o rales, rhonchi, wheezing or stridor.  Cor: Heart sounds normal w/ regular rate and rhythm without sig. murmurs, gallops, clicks or rubs. Peripheral pulses normal and equal  without edema.  Abdomen: Soft & bowel sounds normal. Non-tender w/o guarding, rebound, hernias, masses or organomegaly.  Lymphatics: Unremarkable.  Musculoskeletal: Full ROM all peripheral extremities, joint stability, 5/5 strength and normal gait.  Skin: Warm, dry without exposed rashes, lesions or ecchymosis apparent.  Neuro: Cranial nerves intact, reflexes equal bilaterally. Sensory-motor testing grossly intact. Tendon reflexes grossly intact.  Pysch: Alert & oriented x 3.  Insight and judgement nl & appropriate. No ideations.  Assessment and Plan:  1. Essential hypertension  - Continue medication, monitor blood pressure at home.  - Continue DASH diet.  Reminder to go to the ER if any CP,  SOB, nausea, dizziness, severe HA, changes vision/speech.   - CBC with Differential/Platelet - COMPLETE METABOLIC PANEL WITH GFR - Magnesium - TSH  2. Hyperlipidemia, mixed  - Continue diet/meds, exercise,& lifestyle modifications.  - Continue monitor periodic cholesterol/liver & renal functions    - Lipid panel - TSH  3. Abnormal glucose  - Continue diet, exercise  - Lifestyle modifications.  - Monitor appropriate labs.   - Hemoglobin A1c - Insulin, random  4. Vitamin D deficiency  - Continue supplementation.   - VITAMIN D 25 Hydroxy   5. Chronic atrial fibrillation (HCC)  - TSH  6. Pacemaker   7. Aortic atherosclerosis ([place]) by CXR on 04/03/2020  - Lipid panel  8. Medication management  - CBC with  Differential/Platelet - COMPLETE METABOLIC PANEL WITH GFR - Magnesium - Lipid panel - TSH - Hemoglobin A1c - Insulin, random - VITAMIN D 25 Hydroxy  Discussed  regular exercise, BP monitoring, weight control to achieve/maintain BMI less than 25 and discussed med and SE's. Recommended labs to assess and monitor clinical status with further disposition pending results of labs.  I discussed the assessment and treatment plan with the patient. The patient was provided an opportunity to ask questions and all were answered. The patient agreed with the plan and demonstrated an understanding of the instructions.  I provided over 30 minutes of exam, counseling, chart review and  complex critical decision making.        The patient was advised to call back or seek an in-person evaluation if the symptoms worsen or if the condition fails to improve as anticipated.   Kirtland Bouchard, MD

## 2021-01-12 NOTE — Patient Instructions (Signed)

## 2021-01-13 ENCOUNTER — Ambulatory Visit (INDEPENDENT_AMBULATORY_CARE_PROVIDER_SITE_OTHER): Payer: Medicare Other | Admitting: Internal Medicine

## 2021-01-13 ENCOUNTER — Encounter: Payer: Self-pay | Admitting: Internal Medicine

## 2021-01-13 ENCOUNTER — Other Ambulatory Visit: Payer: Self-pay

## 2021-01-13 VITALS — BP 138/80 | HR 93 | Temp 97.6°F | Resp 16 | Ht 64.0 in | Wt 144.6 lb

## 2021-01-13 DIAGNOSIS — E559 Vitamin D deficiency, unspecified: Secondary | ICD-10-CM

## 2021-01-13 DIAGNOSIS — R7309 Other abnormal glucose: Secondary | ICD-10-CM | POA: Diagnosis not present

## 2021-01-13 DIAGNOSIS — E782 Mixed hyperlipidemia: Secondary | ICD-10-CM

## 2021-01-13 DIAGNOSIS — Z950 Presence of cardiac pacemaker: Secondary | ICD-10-CM

## 2021-01-13 DIAGNOSIS — I10 Essential (primary) hypertension: Secondary | ICD-10-CM | POA: Diagnosis not present

## 2021-01-13 DIAGNOSIS — Z79899 Other long term (current) drug therapy: Secondary | ICD-10-CM

## 2021-01-13 DIAGNOSIS — I700 Atherosclerosis of aorta: Secondary | ICD-10-CM

## 2021-01-13 DIAGNOSIS — I482 Chronic atrial fibrillation, unspecified: Secondary | ICD-10-CM

## 2021-01-14 LAB — TSH: TSH: 1.83 mIU/L (ref 0.40–4.50)

## 2021-01-14 LAB — CBC WITH DIFFERENTIAL/PLATELET
Absolute Monocytes: 778 cells/uL (ref 200–950)
Basophils Absolute: 54 cells/uL (ref 0–200)
Basophils Relative: 0.7 %
Eosinophils Absolute: 54 cells/uL (ref 15–500)
Eosinophils Relative: 0.7 %
HCT: 42.1 % (ref 35.0–45.0)
Hemoglobin: 14.1 g/dL (ref 11.7–15.5)
Lymphs Abs: 2195 cells/uL (ref 850–3900)
MCH: 30.1 pg (ref 27.0–33.0)
MCHC: 33.5 g/dL (ref 32.0–36.0)
MCV: 90 fL (ref 80.0–100.0)
MPV: 10.9 fL (ref 7.5–12.5)
Monocytes Relative: 10.1 %
Neutro Abs: 4620 cells/uL (ref 1500–7800)
Neutrophils Relative %: 60 %
Platelets: 226 10*3/uL (ref 140–400)
RBC: 4.68 10*6/uL (ref 3.80–5.10)
RDW: 12.1 % (ref 11.0–15.0)
Total Lymphocyte: 28.5 %
WBC: 7.7 10*3/uL (ref 3.8–10.8)

## 2021-01-14 LAB — COMPLETE METABOLIC PANEL WITH GFR
AG Ratio: 1.7 (calc) (ref 1.0–2.5)
ALT: 11 U/L (ref 6–29)
AST: 16 U/L (ref 10–35)
Albumin: 4.4 g/dL (ref 3.6–5.1)
Alkaline phosphatase (APISO): 119 U/L (ref 37–153)
BUN: 23 mg/dL (ref 7–25)
CO2: 32 mmol/L (ref 20–32)
Calcium: 10 mg/dL (ref 8.6–10.4)
Chloride: 102 mmol/L (ref 98–110)
Creat: 0.82 mg/dL (ref 0.60–0.95)
Globulin: 2.6 g/dL (calc) (ref 1.9–3.7)
Glucose, Bld: 93 mg/dL (ref 65–99)
Potassium: 4.5 mmol/L (ref 3.5–5.3)
Sodium: 141 mmol/L (ref 135–146)
Total Bilirubin: 0.6 mg/dL (ref 0.2–1.2)
Total Protein: 7 g/dL (ref 6.1–8.1)
eGFR: 67 mL/min/{1.73_m2} (ref >=60)

## 2021-01-14 LAB — MAGNESIUM: Magnesium: 2.3 mg/dL (ref 1.5–2.5)

## 2021-01-14 LAB — LIPID PANEL
Cholesterol: 174 mg/dL (ref <200)
HDL: 47 mg/dL — ABNORMAL LOW (ref >=50)
LDL Cholesterol (Calc): 110 mg/dL (calc) — ABNORMAL HIGH
Non-HDL Cholesterol (Calc): 127 mg/dL (calc) (ref <130)
Total CHOL/HDL Ratio: 3.7 (calc) (ref <5.0)
Triglycerides: 84 mg/dL (ref <150)

## 2021-01-14 LAB — HEMOGLOBIN A1C
Hgb A1c MFr Bld: 5.6 % of total Hgb (ref <5.7)
Mean Plasma Glucose: 114 mg/dL
eAG (mmol/L): 6.3 mmol/L

## 2021-01-14 LAB — INSULIN, RANDOM: Insulin: 7.7 u[IU]/mL

## 2021-01-14 LAB — VITAMIN D 25 HYDROXY (VIT D DEFICIENCY, FRACTURES): Vit D, 25-Hydroxy: 69 ng/mL (ref 30–100)

## 2021-01-14 NOTE — Progress Notes (Signed)
============================================================ -   Test results slightly outside the reference range are not unusual. If there is anything important, I will review this with you,  otherwise it is considered normal test values.  If you have further questions,  please do not hesitate to contact me at the office or via My Chart.  ============================================================ ============================================================  -  Total Chol = 174 - Great,   But . . . . .   - Bad / Dangerous LDL Chol = 110 - way too high                                  (  Ideal or Goal  is less than 70  !  )   - Try eating  # 4  [place] nuts 1 x / week   &     - Cholesterol only comes from animal sources  - ie. meat, dairy, egg yolks - >  So Avoid !    - Eat all the vegetables you want.  - Avoid meat, especially red meat - Beef AND Pork .  - Avoid cheese & dairy - milk & ice cream.     - Cheese is the most concentrated form of trans-fats which  is the worst thing to clog up our arteries.   - Veggie cheese is OK which can be found in the fresh  produce section at [employer] or [employer] or [employer] ============================================================ ============================================================  - A1c - Normal -                     No Diabetes - Great ! ============================================================ ============================================================  - Vitamin D = 69 - Great  ============================================================ ============================================================  - All Else - CBC - Kidneys - Electrolytes - Liver - Magnesium & Thyroid    - all  Normal / OK ============================================================ ============================================================  - Keep up the [place] Work  !    ============================================================ ============================================================

## 2021-01-19 ENCOUNTER — Ambulatory Visit (INDEPENDENT_AMBULATORY_CARE_PROVIDER_SITE_OTHER): Payer: Medicare Other

## 2021-01-19 ENCOUNTER — Other Ambulatory Visit: Payer: Self-pay

## 2021-01-19 DIAGNOSIS — I482 Chronic atrial fibrillation, unspecified: Secondary | ICD-10-CM | POA: Diagnosis not present

## 2021-01-19 DIAGNOSIS — Z7901 Long term (current) use of anticoagulants: Secondary | ICD-10-CM

## 2021-01-19 LAB — POCT INR: INR: 2.1 (ref 2.0–3.0)

## 2021-01-19 NOTE — Patient Instructions (Signed)
Continue taking 1.5 tablets daily except 1 tablet Sunday, Tuesday and Thursday. Repeat INR 6 weeks. Call if you have questions [phone] Erasmo Downer)

## 2021-02-06 ENCOUNTER — Telehealth: Payer: Self-pay

## 2021-02-06 NOTE — Telephone Encounter (Signed)
The patient is receiving a new monitor in 7-10 business days.

## 2021-02-18 NOTE — Progress Notes (Signed)
Patient Care Team: [name], MD as PCP - General (Internal Medicine) [name], [name], MD as PCP - Cardiology (Cardiology) [name], MD (Inactive) as Consulting Physician (Urology) Penni Bombard, MD as Consulting Physician (Neurology) [name], MD as Consulting Physician (Neurology) [name], [name], MD as Consulting Physician (Cardiology) [name], MD (Inactive) as Consulting Physician (Gastroenterology) [name], RN as Oncology Nurse Navigator [name], RN as Oncology Nurse Navigator  DIAGNOSIS:    ICD-10-CM   1. Malignant neoplasm of upper-outer quadrant of right breast in female, estrogen receptor positive ([place])  C50.411    Z17.0       SUMMARY OF ONCOLOGIC HISTORY: Oncology History  Malignant neoplasm of upper-outer quadrant of right breast in female, estrogen receptor positive ([place])  11/15/2019 Cancer Staging   Staging form: Breast, AJCC 8th Edition - Clinical stage from 11/15/2019: Stage IA (cT1c, cN0, cM0, G2, ER+, PR+, HER2-) - Signed by [name], NP on 11/21/2019   11/15/2019 Initial Biopsy   Palpable right breast mass: 1.5 cm at 12 o'clock position, benign intramammary lymph node, axilla negative, right breast biopsy 1 grade 2 ER 80%, PR 50%, Ki-67 5%, HER-2 negative   12/13/2019 Surgery   Right lumpectomy ([facility]): invasive lobular carcinoma, grade 2, 2.4cm, clear margins.      CHIEF COMPLIANT: Follow-up of right breast cancer on anastrozole   INTERVAL HISTORY: Misty Blackwell is a 87 y.o. with above-mentioned history of right breast cancer who underwent a right lumpectomy and is currently on antiestrogen therapy with anastrozole. Mammogram on 12/01/2020 showed no evidence of malignancy. She presents to the clinic today for follow-up.  She reports no symptoms or side effects to anastrozole therapy.  She denies any lumps or nodules in the breast.  She has been traveling a lot for [employer].  ALLERGIES:  is allergic to latex, ace inhibitors, augmentin [amoxicillin-pot clavulanate], ciprofloxacin, levaquin [levofloxacin in d5w], zocor [simvastatin], acrylic polymer [carbomer], chocolate, and gabapentin.  MEDICATIONS:  Current Outpatient Medications  Medication Sig Dispense Refill   acidophilus (RISAQUAD) CAPS capsule Take 1 capsule by mouth daily.     anastrozole (ARIMIDEX) 1 MG tablet Take 1 tablet (1 mg total) by mouth daily. 90 tablet 3   Ascorbic Acid (VITAMIN C) 1000 MG tablet Take 1,000 mg by mouth daily.      Cholecalciferol (VITAMIN D3) 50 MCG (2000 UT) capsule Take by mouth.     clotrimazole-betamethasone (LOTRISONE) cream Apply 1 application topically 2 (two) times daily. 15 g 2   Cyanocobalamin (VITAMIN B 12 PO) Take 1,000 mcg by mouth daily.      diltiazem (CARDIZEM CD) 240 MG 24 hr capsule Take 1 capsule (240 mg total) by mouth daily. 30 capsule 5   furosemide (LASIX) 20 MG tablet Take 1 tablet (20 mg total) by mouth daily. 30 tablet 11   GNP IRON 200 (65 Fe) MG TABS Take by mouth.     hydrOXYzine (ATARAX/VISTARIL) 50 MG tablet TAKE 1 TO 2 TABLETS BY MOUTH EVERY NIGHT 1 HOUR BEFORE BEDTIME AS NEEDED FOR SLEEP 60 tablet 5   Magnesium 250 MG TABS Take 250 mg by mouth daily.     potassium chloride SA (KLOR-CON) 20 MEQ tablet Take  1 tablet  Daily  for Potassium 90 tablet 3   telmisartan (MICARDIS) 80 MG tablet TAKE 1 TABLET(80 MG) BY MOUTH DAILY 90 tablet 3   warfarin (COUMADIN) 3 MG tablet TAKE 1 TO 1 AND 1/2 TABLETS BY MOUTH AS  DIRECTED 135 tablet 1   zinc gluconate 50 MG tablet Take 50 mg by mouth daily.     No current facility-administered medications for this visit.    PHYSICAL EXAMINATION: ECOG PERFORMANCE STATUS: 1 - Symptomatic but completely ambulatory  Vitals:   02/19/21 1506  BP: (!) 169/71  Pulse: 83  Resp: 18  Temp: 97.7 F (36.5 C)  SpO2: 96%   Filed Weights   02/19/21 1506  Weight: 143 lb 12.8 oz (65.2 kg)    BREAST: No palpable  masses or nodules in either right or left breasts. No palpable axillary supraclavicular or infraclavicular adenopathy no breast tenderness or nipple discharge. (exam performed in the presence of a chaperone)  LABORATORY DATA:  I have reviewed the data as listed CMP Latest Ref Rng & Units 01/13/2021 10/30/2020 07/15/2020  Glucose 65 - 99 mg/dL 93 85 99  BUN 7 - 25 mg/dL 23 22 22   Creatinine 0.60 - 0.95 mg/dL 0.82 0.74 0.70  Sodium 135 - 146 mmol/L 141 141 142  Potassium 3.5 - 5.3 mmol/L 4.5 4.8 4.2  Chloride 98 - 110 mmol/L 102 101 102  CO2 20 - 32 mmol/L 32 32 32  Calcium 8.6 - 10.4 mg/dL 10.0 9.9 9.6  Total Protein 6.1 - 8.1 g/dL 7.0 7.2 7.2  Total Bilirubin 0.2 - 1.2 mg/dL 0.6 0.6 0.6  Alkaline Phos 38 - 126 U/L - - -  AST 10 - 35 U/L 16 16 15   ALT 6 - 29 U/L 11 13 10     Lab Results  Component Value Date   WBC 7.7 01/13/2021   HGB 14.1 01/13/2021   HCT 42.1 01/13/2021   MCV 90.0 01/13/2021   PLT 226 01/13/2021   NEUTROABS 4,620 01/13/2021    ASSESSMENT & PLAN:  Malignant neoplasm of upper-outer quadrant of right breast in female, estrogen receptor positive ([place]) 12/13/19: Right lumpectomy ([facility]): invasive lobular carcinoma, grade 2, 2.4cm, clear margins.  ER 80%, PR 50%, HER-2 negative, Ki-67 5%   Treatment plan: Anastrozole adjuvant therapy 1 mg daily x5 years Anastrozole toxicities: Hot flashes have resolved. Otherwise tolerating it extremely well.    Breast cancer surveillance: 1.  Breast exam 06/26/2020: Benign 2. Mammogram to be done July 2022.   She likes to travel around with her church group to hear [place] speakers. Return to clinic in 1 year for follow-up    No orders of the defined types were placed in this encounter.  The patient has a good understanding of the overall plan. she agrees with it. she will call with any problems that may develop before the next visit here.  Total time spent: 20 mins including face to face time and time spent for planning,  charting and coordination of care  Rulon Eisenmenger, MD, MPH 02/19/2021  I, Thana Ates, am acting as scribe for Dr. [name].  I have reviewed the above documentation for accuracy and completeness, and I agree with the above.

## 2021-02-19 ENCOUNTER — Other Ambulatory Visit: Payer: Self-pay

## 2021-02-19 ENCOUNTER — Inpatient Hospital Stay: Payer: Medicare Other | Attending: Hematology and Oncology | Admitting: Hematology and Oncology

## 2021-02-19 DIAGNOSIS — Z79811 Long term (current) use of aromatase inhibitors: Secondary | ICD-10-CM | POA: Diagnosis not present

## 2021-02-19 DIAGNOSIS — Z170 Estrogen receptor positive status [ER+]: Secondary | ICD-10-CM | POA: Diagnosis not present

## 2021-02-19 DIAGNOSIS — C50411 Malignant neoplasm of upper-outer quadrant of right female breast: Secondary | ICD-10-CM | POA: Diagnosis present

## 2021-02-19 MED ORDER — ANASTROZOLE 1 MG PO TABS: 1.0000 mg | ORAL_TABLET | Freq: Every day | ORAL | 3 refills | Status: DC

## 2021-02-19 NOTE — Assessment & Plan Note (Signed)
12/13/19:Right lumpectomy ([facility]): invasive lobular carcinoma, grade 2, 2.4cm, clear margins.ER 80%, PR 50%, HER-2 negative, Ki-67 5%  Treatment plan: Anastrozole adjuvant therapy 1 mg daily x5 years Anastrozole toxicities:Hot flashes have resolved. Otherwise tolerating it extremely well.    Breast cancer surveillance: 1.Breast exam 06/26/2020: Benign 2.Mammogram to be done July 2022.  Return to clinic in1 year for follow-up

## 2021-03-02 ENCOUNTER — Other Ambulatory Visit: Payer: Self-pay

## 2021-03-02 ENCOUNTER — Ambulatory Visit: Payer: Medicare Other

## 2021-03-02 DIAGNOSIS — Z7901 Long term (current) use of anticoagulants: Secondary | ICD-10-CM | POA: Diagnosis not present

## 2021-03-02 DIAGNOSIS — I482 Chronic atrial fibrillation, unspecified: Secondary | ICD-10-CM | POA: Diagnosis not present

## 2021-03-02 LAB — POCT INR: INR: 2.3 (ref 2.0–3.0)

## 2021-03-02 MED ORDER — DILTIAZEM HCL ER COATED BEADS 240 MG PO CP24: 240.0000 mg | ORAL_CAPSULE | Freq: Every day | ORAL | 6 refills | Status: DC

## 2021-03-02 NOTE — Patient Instructions (Signed)
Continue taking 1.5 tablets daily except 1 tablet Sunday, Tuesday and Thursday. Repeat INR 6 weeks. Call if you have questions [phone] Erasmo Downer)

## 2021-03-06 ENCOUNTER — Encounter: Payer: Self-pay | Admitting: Internal Medicine

## 2021-03-16 ENCOUNTER — Other Ambulatory Visit: Payer: Self-pay | Admitting: Adult Health

## 2021-04-08 ENCOUNTER — Encounter: Payer: Medicare Other | Admitting: Adult Health Nurse Practitioner

## 2021-04-13 ENCOUNTER — Ambulatory Visit (INDEPENDENT_AMBULATORY_CARE_PROVIDER_SITE_OTHER): Payer: Medicare Other

## 2021-04-13 ENCOUNTER — Other Ambulatory Visit: Payer: Self-pay

## 2021-04-13 DIAGNOSIS — Z7901 Long term (current) use of anticoagulants: Secondary | ICD-10-CM | POA: Diagnosis not present

## 2021-04-13 DIAGNOSIS — I482 Chronic atrial fibrillation, unspecified: Secondary | ICD-10-CM

## 2021-04-13 DIAGNOSIS — Z5181 Encounter for therapeutic drug level monitoring: Secondary | ICD-10-CM

## 2021-04-13 LAB — POCT INR: INR: 1.8 — AB (ref 2.0–3.0)

## 2021-04-13 NOTE — Patient Instructions (Signed)
-   take 2 tablets warfarin tonight, then - Continue taking 1.5 tablets daily except 1 tablet Sunday, Tuesday and Thursday.  - Repeat INR 6 weeks.  Call if you have questions [phone] Erasmo Downer)

## 2021-04-28 ENCOUNTER — Other Ambulatory Visit: Payer: Self-pay

## 2021-04-28 ENCOUNTER — Encounter: Payer: Self-pay | Admitting: Internal Medicine

## 2021-04-28 ENCOUNTER — Ambulatory Visit (INDEPENDENT_AMBULATORY_CARE_PROVIDER_SITE_OTHER): Payer: Medicare Other | Admitting: Internal Medicine

## 2021-04-28 VITALS — BP 138/84 | HR 86 | Temp 97.9°F | Resp 17 | Ht 64.0 in | Wt 148.8 lb

## 2021-04-28 DIAGNOSIS — I482 Chronic atrial fibrillation, unspecified: Secondary | ICD-10-CM

## 2021-04-28 DIAGNOSIS — E782 Mixed hyperlipidemia: Secondary | ICD-10-CM

## 2021-04-28 DIAGNOSIS — Z7901 Long term (current) use of anticoagulants: Secondary | ICD-10-CM

## 2021-04-28 DIAGNOSIS — K219 Gastro-esophageal reflux disease without esophagitis: Secondary | ICD-10-CM

## 2021-04-28 DIAGNOSIS — I10 Essential (primary) hypertension: Secondary | ICD-10-CM | POA: Diagnosis not present

## 2021-04-28 DIAGNOSIS — Z0000 Encounter for general adult medical examination without abnormal findings: Secondary | ICD-10-CM | POA: Diagnosis not present

## 2021-04-28 DIAGNOSIS — Z136 Encounter for screening for cardiovascular disorders: Secondary | ICD-10-CM | POA: Diagnosis not present

## 2021-04-28 DIAGNOSIS — E559 Vitamin D deficiency, unspecified: Secondary | ICD-10-CM

## 2021-04-28 DIAGNOSIS — Z79899 Other long term (current) drug therapy: Secondary | ICD-10-CM

## 2021-04-28 DIAGNOSIS — Z950 Presence of cardiac pacemaker: Secondary | ICD-10-CM

## 2021-04-28 DIAGNOSIS — I700 Atherosclerosis of aorta: Secondary | ICD-10-CM

## 2021-04-28 DIAGNOSIS — Z0001 Encounter for general adult medical examination with abnormal findings: Secondary | ICD-10-CM

## 2021-04-28 DIAGNOSIS — Z1211 Encounter for screening for malignant neoplasm of colon: Secondary | ICD-10-CM

## 2021-04-28 DIAGNOSIS — R7309 Other abnormal glucose: Secondary | ICD-10-CM

## 2021-04-28 NOTE — Patient Instructions (Signed)
Due to recent changes in healthcare laws, you may see the results of your imaging and laboratory studies on MyChart before your provider has had a chance to review them.  We understand that in some cases there may be results that are confusing or concerning to you. Not all laboratory results come back in the same time frame and the provider may be waiting for multiple results in order to interpret others.  Please give us 48 hours in order for your provider to thoroughly review all the results before contacting the office for clarification of your results.  ° °+++++++++++++++++++++++++ ° Vit D  & °Vit C 1,000 mg   °are recommended to help protect  °against the Covid-19 and other Corona viruses.  ° ° Also it's recommended  °to take  °Zinc 50 mg  °to help  °protect against the Covid-19   °and best place to get ° is also on [employer]  °and don't pay more than 6-8 cents /pill !  °================================ °Coronavirus (COVID-19) Are you at risk? ° °Are you at risk for the Coronavirus (COVID-19)? ° °To be considered HIGH RISK for Coronavirus (COVID-19), you have to meet the following criteria: ° °Traveled to [place], [place], [place], [place] or [place]; or in the [place] to [place], [place], [place]  °or [place]; and have fever, cough, and shortness of breath within the last 2 weeks of travel OR °Been in close contact with a person diagnosed with COVID-19 within the last 2 weeks and have  °fever, cough,and shortness of breath ° °IF YOU DO NOT MEET THESE CRITERIA, YOU ARE CONSIDERED LOW RISK FOR COVID-19. ° °What to do if you are HIGH RISK for COVID-19? ° °If you are having a medical emergency, call 911. °Seek medical care right away. Before you go to a doctor’s office, urgent care or emergency department, ° call ahead and tell them about your recent travel, contact with someone diagnosed with COVID-19  ° and your symptoms.  °You should receive instructions from your physician’s office regarding next  steps of care.  °When you arrive at healthcare provider, tell the healthcare staff immediately you have returned from  °visiting [place], [place], [place], [place] or [place]; or traveled in the [place] to [place], [place],  °[place] or [place] in the last two weeks or you have been in close contact with a person diagnosed with  °COVID-19 in the last 2 weeks.   °Tell the health care staff about your symptoms: fever, cough and shortness of breath. °After you have been seen by a medical provider, you will be either: °Tested for (COVID-19) and discharged home on quarantine except to seek medical care if  °symptoms worsen, and asked to  °Stay home and avoid contact with others until you get your results (4-5 days)  °Avoid travel on public transportation if possible (such as bus, train, or airplane) or °Sent to the Emergency Department by EMS for evaluation, COVID-19 testing  and  °possible admission depending on your condition and test results. ° °What to do if you are LOW RISK for COVID-19? ° °Reduce your risk of any infection by using the same precautions used for avoiding the common cold or flu:  °Wash your hands often with soap and warm water for at least 20 seconds.  If soap and water are not readily available,  °use an alcohol-based hand sanitizer with at least 60% alcohol.  °If coughing or sneezing, cover your mouth and nose by coughing   or sneezing into the elbow areas of your shirt or coat,  into a tissue or into your sleeve (not your hands). Avoid shaking hands with others and consider head nods or verbal greetings only. Avoid touching your eyes, nose, or mouth with unwashed hands.  Avoid close contact with people who are sick. Avoid places or events with large numbers of people in one location, like concerts or sporting events. Carefully consider travel plans you have or are making. If you are planning any travel outside or inside the [place], visit the [facility] webpage for the  latest health notices. If you have some symptoms but not all symptoms, continue to monitor at home and seek medical attention  if your symptoms worsen. If you are having a medical emergency, call 911.   >>>>>>>>>>>>>>>>>>>>>>>>>>>>>>>>>  We Do NOT Approve of [employer] > > > > > > > > > > > > > > > > > > > > > > > > > > > > > > > > > > > > > > >  Preventive Care for Adults  A healthy lifestyle and preventive care can promote health and wellness. Preventive health guidelines for women include the following key practices. A routine yearly physical is a good way to check with your health care provider about your health and preventive screening. It is a chance to share any concerns and updates on your health and to receive a thorough exam. Visit your dentist for a routine exam and preventive care every 6 months. Brush your teeth twice a day and floss once a day. Good oral hygiene prevents tooth decay and gum disease. The frequency of eye exams is based on your age, health, family medical history, use of contact lenses, and other factors. Follow your health care provider's recommendations for frequency of eye exams. Eat a healthy diet. Foods like vegetables, fruits, whole grains, low-fat dairy products, and lean protein foods contain the nutrients you need without too many calories. Decrease your intake of foods high in solid fats, added sugars, and salt. Eat the right amount of calories for you. Get information about a proper diet from your health care provider, if necessary. Regular physical exercise is one of the most important things you can do for your health. Most adults should get at least 150 minutes of moderate-intensity exercise (any activity that increases your heart rate and causes you to sweat) each week. In addition, most adults need muscle-strengthening exercises on 2 or more days a week. Maintain a healthy weight. The body mass index (BMI) is a screening tool to identify  possible weight problems. It provides an estimate of body fat based on height and weight. Your health care provider can find your BMI and can help you achieve or maintain a healthy weight. For adults 20 years and older: A BMI below 18.5 is considered underweight. A BMI of 18.5 to 24.9 is normal. A BMI of 25 to 29.9 is considered overweight. A BMI of 30 and above is considered obese. Maintain normal blood lipids and cholesterol levels by exercising and minimizing your intake of saturated fat. Eat a balanced diet with plenty of fruit and vegetables. If your lipid or cholesterol levels are high, you are over 50, or you are at high risk for heart disease, you may need your cholesterol levels checked more frequently. Ongoing high lipid and cholesterol levels should be treated with medicines if diet and exercise are not working. If you smoke, find out from  your health care provider how to quit. If you do not use tobacco, do not start. °Lung cancer screening is recommended for adults aged 55-80 years who are at high risk for developing lung cancer because of a history of smoking. A yearly low-dose CT scan of the lungs is recommended for people who have at least a 30-pack-year history of smoking and are a current smoker or have quit within the past 15 years. A pack year of smoking is smoking an average of 1 pack of cigarettes a day for 1 year (for example: 1 pack a day for 30 years or 2 packs a day for 15 years). Yearly screening should continue until the smoker has stopped smoking for at least 15 years. Yearly screening should be stopped for people who develop a health problem that would prevent them from having lung cancer treatment. °Avoid use of street drugs. Do not share needles with anyone. Ask for help if you need support or instructions about stopping the use of drugs. °High blood pressure causes heart disease and increases the risk of stroke.  Ongoing high blood pressure should be treated with medicines if  weight loss and exercise do not work. °If you are 55-79 years old, ask your health care provider if you should take aspirin to prevent strokes. °Diabetes screening involves taking a blood sample to check your fasting blood sugar level. This should be done once every 3 years, after age 45, if you are within normal weight and without risk factors for diabetes. Testing should be considered at a younger age or be carried out more frequently if you are overweight and have at least 1 risk factor for diabetes. °Breast cancer screening is essential preventive care for women. You should practice "breast self-awareness." This means understanding the normal appearance and feel of your breasts and may include breast self-examination. Any changes detected, no matter how small, should be reported to a health care provider. Women in their 20s and 30s should have a clinical breast exam (CBE) by a health care provider as part of a regular health exam every 1 to 3 years. After age 40, women should have a CBE every year. Starting at age 40, women should consider having a mammogram (breast X-ray test) every year. Women who have a family history of breast cancer should talk to their health care provider about genetic screening. Women at a high risk of breast cancer should talk to their health care providers about having an MRI and a mammogram every year. °Breast cancer gene (BRCA)-related cancer risk assessment is recommended for women who have family members with BRCA-related cancers. BRCA-related cancers include breast, ovarian, tubal, and peritoneal cancers. Having family members with these cancers may be associated with an increased risk for harmful changes (mutations) in the breast cancer genes BRCA1 and BRCA2. Results of the assessment will determine the need for genetic counseling and BRCA1 and BRCA2 testing. °Routine pelvic exams to screen for cancer are no longer recommended for nonpregnant women who are considered low risk for  cancer of the pelvic organs (ovaries, uterus, and vagina) and who do not have symptoms. Ask your health care provider if a screening pelvic exam is right for you. °If you have had past treatment for cervical cancer or a condition that could lead to cancer, you need Pap tests and screening for cancer for at least 20 years after your treatment. If Pap tests have been discontinued, your risk factors (such as having a new sexual partner) need to be   reassessed to determine if screening should be resumed. Some women have medical problems that increase the chance of getting cervical cancer. In these cases, your health care provider may recommend more frequent screening and Pap tests.  Colorectal cancer can be detected and often prevented. Most routine colorectal cancer screening begins at the age of 64 years and continues through age 10 years. However, your health care provider may recommend screening at an earlier age if you have risk factors for colon cancer. On a yearly basis, your health care provider may provide home test kits to check for hidden blood in the stool. Use of a small camera at the end of a tube, to directly examine the colon (sigmoidoscopy or colonoscopy), can detect the earliest forms of colorectal cancer. Talk to your health care provider about this at age 78, when routine screening begins.  Direct exam of the colon should be repeated every 5-10 years through age 28 years, unless early forms of pre-cancerous polyps or small growths are found. Osteoporosis is a disease in which the bones lose minerals and strength with aging. This can result in serious bone fractures or breaks. The risk of osteoporosis can be identified using a bone density scan. Women ages 57 years and over and women at risk for fractures or osteoporosis should discuss screening with their health care providers. Ask your health care provider whether you should take a calcium supplement or vitamin D to reduce the rate of  osteoporosis. Menopause can be associated with physical symptoms and risks. Hormone replacement therapy is available to decrease symptoms and risks. You should talk to your health care provider about whether hormone replacement therapy is right for you. Use sunscreen. Apply sunscreen liberally and repeatedly throughout the day. You should seek shade when your shadow is shorter than you. Protect yourself by wearing long sleeves, pants, a wide-brimmed hat, and sunglasses year round, whenever you are outdoors. Once a month, do a whole body skin exam, using a mirror to look at the skin on your back. Tell your health care provider of new moles, moles that have irregular borders, moles that are larger than a pencil eraser, or moles that have changed in shape or color. Stay current with required vaccines (immunizations). Influenza vaccine. All adults should be immunized every year. Tetanus, diphtheria, and acellular pertussis (Td, Tdap) vaccine. Pregnant women should receive 1 dose of Tdap vaccine during each pregnancy. The dose should be obtained regardless of the length of time since the last dose. Immunization is preferred during the 27th-36th week of gestation. An adult who has not previously received Tdap or who does not know her vaccine status should receive 1 dose of Tdap. This initial dose should be followed by tetanus and diphtheria toxoids (Td) booster doses every 10 years. Adults with an unknown or incomplete history of completing a 3-dose immunization series with Td-containing vaccines should begin or complete a primary immunization series including a Tdap dose. Adults should receive a Td booster every 10 years.  Zoster vaccine. One dose is recommended for adults aged 29 years or older unless certain conditions are present.  Pneumococcal 13-valent conjugate (PCV13) vaccine. When indicated, a person who is uncertain of her immunization history and has no record of immunization should receive the PCV13  vaccine. An adult aged 43 years or older who has certain medical conditions and has not been previously immunized should receive 1 dose of PCV13 vaccine. This PCV13 should be followed with a dose of pneumococcal polysaccharide (PPSV23) vaccine. The PPSV23  vaccine dose should be obtained at least 1 or more year(s) after the dose of PCV13 vaccine. An adult aged 19 years or older who has certain medical conditions and previously received 1 or more doses of PPSV23 vaccine should receive 1 dose of PCV13. The PCV13 vaccine dose should be obtained 1 or more years after the last PPSV23 vaccine dose. ° °Pneumococcal polysaccharide (PPSV23) vaccine. When PCV13 is also indicated, PCV13 should be obtained first. All adults aged 65 years and older should be immunized. An adult younger than age 65 years who has certain medical conditions should be immunized. Any person who resides in a nursing home or long-term care facility should be immunized. An adult smoker should be immunized. People with an immunocompromised condition and certain other conditions should receive both PCV13 and PPSV23 vaccines. People with human immunodeficiency virus (HIV) infection should be immunized as soon as possible after diagnosis. Immunization during chemotherapy or radiation therapy should be avoided. Routine use of PPSV23 vaccine is not recommended for American Indians, [place] Natives, or people younger than 65 years unless there are medical conditions that require PPSV23 vaccine. When indicated, people who have unknown immunization and have no record of immunization should receive PPSV23 vaccine. One-time revaccination 5 years after the first dose of PPSV23 is recommended for people aged 19-64 years who have chronic kidney failure, nephrotic syndrome, asplenia, or immunocompromised conditions. People who received 1-2 doses of PPSV23 before age 65 years should receive another dose of PPSV23 vaccine at age 65 years or later if at least 5 years have  passed since the previous dose. Doses of PPSV23 are not needed for people immunized with PPSV23 at or after age 65 years. ° °Preventive Services / Frequency ° °Ages 65 years and over °Blood pressure check. °Lipid and cholesterol check. °Lung cancer screening. / Every year if you are aged 55-80 years and have a 30-pack-year history of smoking and currently smoke or have quit within the past 15 years. Yearly screening is stopped once you have quit smoking for at least 15 years or develop a health problem that would prevent you from having lung cancer treatment. °Clinical breast exam.** / Every year after age 40 years. ° BRCA-related cancer risk assessment.** / For women who have family members with a BRCA-related cancer (breast, ovarian, tubal, or peritoneal cancers). °Mammogram.** / Every year beginning at age 40 years and continuing for as long as you are in good health. Consult with your health care provider. °Pap test.** / Every 3 years starting at age 30 years through age 65 or 70 years with 3 consecutive normal Pap tests. Testing can be stopped between 65 and 70 years with 3 consecutive normal Pap tests and no abnormal Pap or HPV tests in the past 10 years. °Fecal occult blood test (FOBT) of stool. / Every year beginning at age 50 years and continuing until age 75 years. You may not need to do this test if you get a colonoscopy every 10 years. °Flexible sigmoidoscopy or colonoscopy.** / Every 5 years for a flexible sigmoidoscopy or every 10 years for a colonoscopy beginning at age 50 years and continuing until age 75 years. °Hepatitis C blood test.** / For all people born from 1945 through 1965 and any individual with known risks for hepatitis C. °Osteoporosis screening.** / A one-time screening for women ages 65 years and over and women at risk for fractures or osteoporosis. °Skin self-exam. / Monthly. °Influenza vaccine. / Every year. °Tetanus, diphtheria, and acellular pertussis (Tdap/Td) vaccine.** /   1 dose  of Td every 10 years. °Zoster vaccine.** / 1 dose for adults aged 60 years or older. °Pneumococcal 13-valent conjugate (PCV13) vaccine.** / Consult your health care provider. °Pneumococcal polysaccharide (PPSV23) vaccine.** / 1 dose for all adults aged 65 years and older. °Screening for abdominal aortic aneurysm (AAA)  by ultrasound is recommended for people who have history of high blood pressure or who are current or former smokers. °++++++++++++++++++++ °Recommend Adult Low Dose Aspirin or  °coated  Aspirin 81 mg daily  °To reduce risk of Colon Cancer 40 %, ° Skin Cancer 26 % ,  °Melanoma 46% ° and  °Pancreatic cancer 60% °++++++++++++++++++++ °Vitamin D goal ° is between 70-100.  °Please make sure that you are taking your Vitamin D as directed.  °It is very important as a natural anti-inflammatory  °helping hair, skin, and nails, as well as reducing stroke and heart attack risk.  °It helps your bones and helps with mood. °It also decreases numerous cancer risks so please take it as directed.  °Low Vit D is associated with a 200-300% higher risk for CANCER  °and 200-300% higher risk for HEART   ATTACK  &  STROKE.   °...................................... °It is also associated with higher death rate at younger ages,  °autoimmune diseases like Rheumatoid arthritis, Lupus, Multiple Sclerosis.    °Also many other serious conditions, like depression, Alzheimer's °Dementia, infertility, muscle aches, fatigue, fibromyalgia - just to name a few. °++++++++++++++++++ °Recommend the book "The END of DIETING" by Dr Joel Fuhrman  °& the book "The END of DIABETES " by Dr Joel Fuhrman °At [employer] - get book & Audio CD's  °  Being diabetic has a  300% increased risk for heart attack, stroke, cancer, and alzheimer- type vascular dementia. It is very important that you work harder with diet by avoiding all foods that are white. Avoid white rice (brown & wild rice is OK), white potatoes (sweetpotatoes in moderation is OK),  White bread or wheat bread or anything made out of white flour like bagels, donuts, rolls, buns, biscuits, cakes, pastries, cookies, pizza crust, and pasta (made from white flour & egg whites) - vegetarian pasta or spinach or wheat pasta is OK. Multigrain breads like Arnold's or [employer], or multigrain sandwich thins or flatbreads.  Diet, exercise and weight loss can reverse and cure diabetes in the early stages.  Diet, exercise and weight loss is very important in the control and prevention of complications of diabetes which affects every system in your body, ie. Brain - dementia/stroke, eyes - glaucoma/blindness, heart - heart attack/heart failure, kidneys - dialysis, stomach - gastric paralysis, intestines - malabsorption, nerves - severe painful neuritis, circulation - gangrene & loss of a leg(s), and finally cancer and Alzheimers. ° °  I recommend avoid fried & greasy foods,  sweets/candy, white rice (brown or wild rice or Quinoa is OK), white potatoes (sweet potatoes are OK) - anything made from white flour - bagels, doughnuts, rolls, buns, biscuits,white and wheat breads, pizza crust and traditional pasta made of white flour & egg white(vegetarian pasta or spinach or wheat pasta is OK).  Multi-grain bread is OK - like multi-grain flat bread or sandwich thins. Avoid alcohol in excess. Exercise is also important. ° °  Eat all the vegetables you want - avoid meat, especially red meat and dairy - especially cheese.  Cheese is the most concentrated form of trans-fats which is the worst thing to clog up our arteries. Veggie cheese is OK   which can be found in the fresh produce section at [employer] or [employer] or [employer] ° °+++++++++++++++++++ °DASH Eating Plan ° °DASH stands for "Dietary Approaches to Stop Hypertension."  ° °The DASH eating plan is a healthy eating plan that has been shown to reduce high blood pressure (hypertension). Additional health benefits may include reducing the risk of type 2  diabetes mellitus, heart disease, and stroke. The DASH eating plan may also help with weight loss. °WHAT DO I NEED TO KNOW ABOUT THE DASH EATING PLAN? °For the DASH eating plan, you will follow these general guidelines: °Choose foods with a percent daily value for sodium of less than 5% (as listed on the food label). °Use salt-free seasonings or herbs instead of table salt or sea salt. °Check with your health care provider or pharmacist before using salt substitutes. °Eat lower-sodium products, often labeled as "lower sodium" or "no salt added." °Eat fresh foods. °Eat more vegetables, fruits, and low-fat dairy products. °Choose whole grains. Look for the word "whole" as the first word in the ingredient list. °Choose fish  °Limit sweets, desserts, sugars, and sugary drinks. °Choose heart-healthy fats. °Eat veggie cheese  °Eat more home-cooked food and less restaurant, buffet, and fast food. °Limit fried foods. °Cook foods using methods other than frying. °Limit canned vegetables. If you do use them, rinse them well to decrease the sodium. °When eating at a restaurant, ask that your food be prepared with less salt, or no salt if possible. °                  °   WHAT FOODS CAN I EAT? °Read Dr Joel Fuhrman's books on The End of Dieting & The End of Diabetes ° °Grains °Whole grain or whole wheat bread. Brown rice. Whole grain or whole wheat pasta. Quinoa, bulgur, and whole grain cereals. Low-sodium cereals. Corn or whole wheat flour tortillas. Whole grain cornbread. Whole grain crackers. Low-sodium crackers. ° °Vegetables °Fresh or frozen vegetables (raw, steamed, roasted, or grilled). Low-sodium or reduced-sodium tomato and vegetable juices. Low-sodium or reduced-sodium tomato sauce and paste. Low-sodium or reduced-sodium canned vegetables.  ° °Fruits °All fresh, canned (in natural juice), or frozen fruits. ° °Protein Products ° All fish and seafood.  Dried beans, peas, or lentils. Unsalted nuts and seeds. Unsalted  canned beans. ° °Dairy °Low-fat dairy products, such as skim or 1% milk, 2% or reduced-fat cheeses, low-fat ricotta or cottage cheese, or plain low-fat yogurt. Low-sodium or reduced-sodium cheeses. ° °Fats and Oils °Tub margarines without trans fats. Light or reduced-fat mayonnaise and salad dressings (reduced sodium). Avocado. Safflower, olive, or canola oils. Natural peanut or almond butter. ° °Other °Unsalted popcorn and pretzels. °The items listed above may not be a complete list of recommended foods or beverages. Contact your dietitian for more options. ° °+++++++++++++++ ° °WHAT FOODS ARE NOT RECOMMENDED? °Grains/ White flour or wheat flour °White bread. White pasta. White rice. Refined cornbread. Bagels and croissants. Crackers that contain trans fat. ° °Vegetables ° °Creamed or fried vegetables. Vegetables in a . Regular canned vegetables. Regular canned tomato sauce and paste. Regular tomato and vegetable juices. ° °Fruits °Dried fruits. Canned fruit in light or heavy syrup. Fruit juice. ° °Meat and Other Protein Products °Meat in general - RED meat & White meat.  Fatty cuts of meat. Ribs, chicken wings, all processed meats as bacon, sausage, bologna, salami, fatback, hot dogs, bratwurst and packaged luncheon meats. ° °Dairy °Whole or 2% milk, cream, half-and-half, and cream cheese.   Whole-fat or sweetened yogurt. Full-fat cheeses or blue cheese. Non-dairy creamers and whipped toppings. Processed cheese, cheese spreads, or cheese curds. ° °Condiments °Onion and garlic salt, seasoned salt, table salt, and sea salt. Canned and packaged gravies. Worcestershire sauce. Tartar sauce. Barbecue sauce. Teriyaki sauce. Soy sauce, including reduced sodium. Steak sauce. Fish sauce. Oyster sauce. Cocktail sauce. Horseradish. Ketchup and mustard. Meat flavorings and tenderizers. Bouillon cubes. Hot sauce. Tabasco sauce. Marinades. Taco seasonings. Relishes. ° °Fats and Oils °Butter, stick margarine, lard, shortening and  bacon fat. Coconut, palm kernel, or palm oils. Regular salad dressings. ° °Pickles and olives. Salted popcorn and pretzels. ° °The items listed above may not be a complete list of foods and beverages to avoid. ° ° °

## 2021-04-28 NOTE — Progress Notes (Signed)
Annual Screening/Preventative Visit & Comprehensive Evaluation &  Examination  Future Appointments  Date Time Provider Department  04/28/2021  2:00 PM [name], MD [facility]  06/25/2021  9:30 AM [name], MD [facility]  07/15/2021  9:30 AM Liane Comber, NP [facility]  02/22/2022  8:15 AM [name], MD [facility]        This very nice 87 y.o. [name] presents for a Screening /Preventative Visit & comprehensive evaluation and management of multiple medical co-morbidities.  Patient has been followed for HTN, HLD, Prediabetes  and Vitamin D Deficiency.                                                  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             HTN predates circa 1988.  Heart caths in  1993 & 2007 showed minimal or non-obstructive CAD.  Dx'd pAfib in 6122 after and embolic TIA.   (On coumadin per [facility] ) .  In 2007, a  PPM was implanted  for SSS . Patient's BP has been controlled at home and patient denies any cardiac symptoms as chest pain, palpitations, shortness of breath, dizziness or ankle swelling. Today's BP was initially elevated & rechecked at goal. 138/84.        Patient's hyperlipidemia is controlled with diet and medications. Patient denies myalgias or other medication SE's. Last lipids were not at goal :  Lab Results  Component Value Date   CHOL 174 01/13/2021   HDL 47 (L) 01/13/2021   LDLCALC 110 (H) 01/13/2021   TRIG 84 01/13/2021   CHOLHDL 3.7 01/13/2021         Patient has hx/o prediabetes with Insulin Resistance (A1c 5.9% /Insulin elev 86 /2011) and patient denies reactive hypoglycemic symptoms, visual blurring, diabetic polys or paresthesias. Last A1c was at goal:  Lab Results  Component Value Date   HGBA1C 5.6 01/13/2021         Finally, patient has history of Vitamin D Deficiency and last Vitamin D was at  goal :  Lab Results  Component Value Date   VD25OH 69 01/13/2021     Current Outpatient Medications on File Prior to Visit  Medication Sig   acidophilus CAPS capsule Take 1 capsule  daily.   anastrozole 1 MG tablet Take 1 tablet daily.   VITAMIN C) 1000 MG tablet Take  daily.    VITAMIN D 2000 u Take    LOTRISONE cream Apply 1 application topically 2 (two) times daily.   VITAMIN B 12 tab   1,000 mcg  Take   daily.    diltiazem CD 240 MG 24 hr capsule Take 1 capsule daily.   furosemide  20 MG tablet Take 1 tablet  daily.   GNP IRON 200 (65 Fe) MG  Take    hydrOXYzine 50 MG tablet TAKE 1 TO 2 TABLETS EVERY NIGHT 1 HOUR BEFORE BEDTIME AS NEEDED FOR SLEEP   Magnesium 250 MG TABS Take daily.   potassium chloride 20 MEQ tablet Take  1 tablet  Daily    telmisartan  80 MG tablet TAKE 1 TABLET  DAILY   Warfarin  3 MG tablet  TAKE 1 TO 1 &1/2 TABLET    zinc  50 MG tablet Take  daily.      Allergies  Allergen Reactions   Latex Itching   Ace Inhibitors Other (See Comments)    Unknown reaction   Augmentin [Amoxicillin-Pot Clavulanate] Other (See Comments)   Ciprofloxacin Other (See Comments)   Levaquin [Levofloxacin In D5w] Other (See Comments)   Zocor [Simvastatin] Other (See Comments)   Acrylic Polymer [Carbomer] Itching   Chocolate Other (See Comments)    migraine's    Gabapentin Other (See Comments)    Unsteady gait      Past Medical History:  Diagnosis Date   Anxiety    Atrial fib/flutter, transient    Atrial fibrillation, chronic (HCC)    CHF (congestive heart failure) ([place]) 10/29/2009   Echo - EF >55%; normal LV size and systolic function; unable to assess diastolic fcn due to E/A fusion, pulmonary vein flow pattern suggests elevated filling pressure; marked biatrail dilation, mild/mod tricuspid regurgitation; mod pulmonary htn; mild/mod mitral regurgitation; although echocardiographic features are incomplete findings suggest possible infiltrative cardiomyopathy (maybe  amyloidosi   Coronary artery disease 03/19/2002   R/P Cardiolite - EF 76%; nromal static and dynamic myocardial perfusion images; normal wall motion and endocardial thickening in all vascular territories   Dyspnea    Dysrhythmia    Facial numbness 12/26/2008   carotid doppler - R and L ICAs 0-49% diameter reduction (velocities suggest low end of scale)   Hypertension    Pacemaker    Peripheral neuropathy    Pneumonia 2017   Skin cancer    s/p surgical removal.   Stroke ([place]) 04/2017   TIA (transient ischemic attack)      Health Maintenance  Topic Date Due   Zoster Vaccines- Shingrix (1 of 2) Never done   DEXA SCAN  Never done   COVID-19 Vaccine (4 - Booster for Pfizer series) 05/16/2020   INFLUENZA VACCINE  12/01/2020   TETANUS/TDAP  03/18/2024   Pneumonia Vaccine 31+ Years old  Completed   HPV VACCINES  Aged Out     Immunization History  Administered Date(s) Administered   DT (Pediatric) 03/19/2015   Influenza Split 05/04/2011   Influenza, High Dose Seasonal PF 03/19/2014, 12/23/2015, 02/03/2017, 12/26/2018, 01/28/2020   Influenza,inj,quad, With Preservative 05/21/2013   Influenza-Unspecified 02/04/2015, 02/03/2017, 01/28/2020   PFIZER(Purple Top)SARS-COV-2 Vaccination 06/16/2019, 07/09/2019, 03/21/2020   Pneumococcal Conjugate-13 03/19/2014   Pneumococcal Polysaccharide-23 05/04/2011   Pneumococcal-Unspecified 05/03/2001   Td 05/04/2003     Last Colon - 09/10/2008 - no f/u due to age.   Last MGM - 12/01/2020    Past Surgical History:  Procedure Laterality Date   ABDOMINAL HYSTERECTOMY     APPENDECTOMY     BREAST LUMPECTOMY WITH RADIOACTIVE SEED LOCALIZATION Right 12/13/2019   Procedure: RIGHT BREAST LUMPECTOMY WITH RADIOACTIVE SEED LOCALIZATION;  Surgeon: Donnie Mesa, MD;  Location: [place];  Service: General;  Laterality: Right;  LMA VS MAC   BREAST SURGERY Left 1949   CARDIAC CATHETERIZATION  08/06/2005   minimal coronary disease predominant RCA; no  significant atherosclerosis; new onset sick sinus syndrome and atrial flutter w/ ventricular response, controlled on med therapy; systemic HTN, normal renal arteries   CARDIOVERSION  11/19/2009   successful DCCV from AF to sinus type rhythm   CHOLECYSTECTOMY     EYE SURGERY Bilateral 2013   Skin cancer resection     TONSILLECTOMY  1938     Family History  Problem Relation Age of Onset  Heart disease Mother    Diabetes Mother    Heart attack Father    Heart disease Father    Cirrhosis Brother      Social History   Tobacco Use   Smoking status: Never   Smokeless tobacco: Never  Vaping Use   Vaping Use: Never used  Substance Use Topics   Alcohol use: No   Drug use: No      ROS Constitutional: Denies fever, chills, weight loss/gain, headaches, insomnia,  night sweats, and change in appetite. Does c/o fatigue. Eyes: Denies redness, blurred vision, diplopia, discharge, itchy, watery eyes.  ENT: Denies discharge, congestion, post nasal drip, epistaxis, sore throat, earache, hearing loss, dental pain, Tinnitus, Vertigo, Sinus pain, snoring.  Cardio: Denies chest pain, palpitations, irregular heartbeat, syncope, dyspnea, diaphoresis, orthopnea, PND, claudication, edema Respiratory: denies cough, dyspnea, DOE, pleurisy, hoarseness, laryngitis, wheezing.  Gastrointestinal: Denies dysphagia, heartburn, reflux, water brash, pain, cramps, nausea, vomiting, bloating, diarrhea, constipation, hematemesis, melena, hematochezia, jaundice, hemorrhoids Genitourinary: Denies dysuria, frequency, urgency, nocturia, hesitancy, discharge, hematuria, flank pain Breast: Breast lumps, nipple discharge, bleeding.  Musculoskeletal: Denies arthralgia, myalgia, stiffness, Jt. Swelling, pain, limp, and strain/sprain. Denies falls. Skin: Denies puritis, rash, hives, warts, acne, eczema, changing in skin lesion Neuro: No weakness, tremor, incoordination, spasms, paresthesia, pain Psychiatric: Denies  confusion, memory loss, sensory loss. Denies Depression. Endocrine: Denies change in weight, skin, hair change, nocturia, and paresthesia, diabetic polys, visual blurring, hyper / hypo glycemic episodes.  Heme/Lymph: No excessive bleeding, bruising, enlarged lymph nodes.  Physical Exam  BP 138/84    Pulse 86    Temp 97.9 F (36.6 C)    Resp 17    Ht _0  (1.626 m)    Wt 148 lb 12.8 oz (67.5 kg)    SpO2 96%    BMI 25.54 kg/m   General Appearance: Well nourished, well groomed and in no apparent distress.  Eyes: PERRLA, EOMs, conjunctiva no swelling or erythema, normal fundi and vessels. Sinuses: No frontal/maxillary tenderness ENT/Mouth: EACs patent / TMs  nl. Nares clear without erythema, swelling, mucoid exudates. Oral hygiene is good. No erythema, swelling, or exudate. Tongue normal, non-obstructing. Tonsils not swollen or erythematous. Hearing normal.  Neck: Supple, thyroid not palpable. No bruits, nodes or JVD. Respiratory: Respiratory effort normal.  BS equal and clear bilateral without rales, rhonci, wheezing or stridor. Cardio: Heart sounds are normal with regular rate and rhythm and no murmurs, rubs or gallops. Peripheral pulses are normal and equal bilaterally without edema. No aortic or femoral bruits. Chest: symmetric with normal excursions and percussion. Breasts: Symmetric, without lumps, nipple discharge, retractions, or fibrocystic changes.  Abdomen: Flat, soft with bowel sounds active. Nontender, no guarding, rebound, hernias, masses, or organomegaly.  Lymphatics: Non tender without lymphadenopathy.  Genitourinary:  Musculoskeletal: Full ROM all peripheral extremities, joint stability, 5/5 strength, and normal gait. Skin: Warm and dry without rashes, lesions, cyanosis, clubbing or  ecchymosis.  Neuro: Cranial nerves intact, reflexes equal bilaterally. Normal muscle tone, no cerebellar symptoms. Sensation intact.  Pysch: Alert and oriented X 3, normal affect, Insight and  Judgment appropriate.    Assessment and Plan  1. Annual Preventative Screening Examination   2. Essential hypertension  - EKG 12-Lead - Urinalysis, Routine w reflex microscopic - Microalbumin / creatinine urine ratio - CBC with Differential/Platelet - COMPLETE METABOLIC PANEL WITH GFR - Magnesium - TSH  3. Hyperlipidemia, mixed  - EKG 12-Lead - Lipid panel - TSH  4. Abnormal glucose  - EKG 12-Lead - Hemoglobin A1c -  Insulin, random  5. Vitamin D deficiency  - VITAMIN D 25 Hydroxy   6. Chronic atrial fibrillation (HCC)  - EKG 12-Lead - TSH  7. Pacemaker  - EKG 12-Lead - Lipid panel  8. Aortic atherosclerosis ([place]) by CXR on 04/03/2020  - EKG 12-Lead - Lipid panel  9. Screening for colorectal cancer  - POC Hemoccult Bld/Stl   10. Gastroesophageal reflux disease without esophagitis  - CBC with Differential/Platelet  11. Long term current use of anticoagulant therapy   12. Medication management  - Urinalysis, Routine w reflex microscopic - Microalbumin / creatinine urine ratio - CBC with Differential/Platelet - COMPLETE METABOLIC PANEL WITH GFR - Magnesium - Lipid panel - TSH - Hemoglobin A1c - Insulin, random - VITAMIN D 25 Hydroxy          Patient was counseled in prudent diet to achieve/maintain BMI less than 25 for weight control, BP monitoring, regular exercise and medications. Discussed med's effects and SE's. Screening labs and tests as requested with regular follow-up as recommended. Over 40 minutes of exam, counseling, chart review and high complex critical decision making was performed.   Kirtland Bouchard, MD

## 2021-04-29 LAB — URINALYSIS, ROUTINE W REFLEX MICROSCOPIC
Bilirubin Urine: NEGATIVE
Glucose, UA: NEGATIVE
Hgb urine dipstick: NEGATIVE
Ketones, ur: NEGATIVE
Leukocytes,Ua: NEGATIVE
Nitrite: NEGATIVE
Protein, ur: NEGATIVE
Specific Gravity, Urine: 1.022 (ref 1.001–1.035)
pH: 6 (ref 5.0–8.0)

## 2021-04-29 LAB — CBC WITH DIFFERENTIAL/PLATELET
Absolute Monocytes: 918 cells/uL (ref 200–950)
Basophils Absolute: 68 cells/uL (ref 0–200)
Basophils Relative: 0.8 %
Eosinophils Absolute: 119 cells/uL (ref 15–500)
Eosinophils Relative: 1.4 %
HCT: 42.5 % (ref 35.0–45.0)
Hemoglobin: 13.8 g/dL (ref 11.7–15.5)
Lymphs Abs: 2321 cells/uL (ref 850–3900)
MCH: 30.1 pg (ref 27.0–33.0)
MCHC: 32.5 g/dL (ref 32.0–36.0)
MCV: 92.8 fL (ref 80.0–100.0)
MPV: 10.9 fL (ref 7.5–12.5)
Monocytes Relative: 10.8 %
Neutro Abs: 5075 cells/uL (ref 1500–7800)
Neutrophils Relative %: 59.7 %
Platelets: 230 10*3/uL (ref 140–400)
RBC: 4.58 10*6/uL (ref 3.80–5.10)
RDW: 11.9 % (ref 11.0–15.0)
Total Lymphocyte: 27.3 %
WBC: 8.5 10*3/uL (ref 3.8–10.8)

## 2021-04-29 LAB — HEMOGLOBIN A1C
Hgb A1c MFr Bld: 5.6 % of total Hgb (ref <5.7)
Mean Plasma Glucose: 114 mg/dL
eAG (mmol/L): 6.3 mmol/L

## 2021-04-29 LAB — COMPLETE METABOLIC PANEL WITH GFR
AG Ratio: 1.7 (calc) (ref 1.0–2.5)
ALT: 11 U/L (ref 6–29)
AST: 15 U/L (ref 10–35)
Albumin: 4.5 g/dL (ref 3.6–5.1)
Alkaline phosphatase (APISO): 130 U/L (ref 37–153)
BUN: 25 mg/dL (ref 7–25)
CO2: 31 mmol/L (ref 20–32)
Calcium: 10 mg/dL (ref 8.6–10.4)
Chloride: 103 mmol/L (ref 98–110)
Creat: 0.67 mg/dL (ref 0.60–0.95)
Globulin: 2.6 g/dL (calc) (ref 1.9–3.7)
Glucose, Bld: 88 mg/dL (ref 65–99)
Potassium: 4.4 mmol/L (ref 3.5–5.3)
Sodium: 143 mmol/L (ref 135–146)
Total Bilirubin: 0.6 mg/dL (ref 0.2–1.2)
Total Protein: 7.1 g/dL (ref 6.1–8.1)
eGFR: 82 mL/min/{1.73_m2} (ref >=60)

## 2021-04-29 LAB — LIPID PANEL
Cholesterol: 165 mg/dL (ref <200)
HDL: 47 mg/dL — ABNORMAL LOW (ref >=50)
LDL Cholesterol (Calc): 97 mg/dL (calc)
Non-HDL Cholesterol (Calc): 118 mg/dL (calc) (ref <130)
Total CHOL/HDL Ratio: 3.5 (calc) (ref <5.0)
Triglycerides: 115 mg/dL (ref <150)

## 2021-04-29 LAB — INSULIN, RANDOM: Insulin: 5.7 u[IU]/mL

## 2021-04-29 LAB — MAGNESIUM: Magnesium: 2.2 mg/dL (ref 1.5–2.5)

## 2021-04-29 LAB — TSH: TSH: 1.2 mIU/L (ref 0.40–4.50)

## 2021-04-29 LAB — MICROALBUMIN / CREATININE URINE RATIO
Creatinine, Urine: 80 mg/dL (ref 20–275)
Microalb Creat Ratio: 20 mcg/mg creat (ref <30)
Microalb, Ur: 1.6 mg/dL

## 2021-04-29 LAB — VITAMIN D 25 HYDROXY (VIT D DEFICIENCY, FRACTURES): Vit D, 25-Hydroxy: 59 ng/mL (ref 30–100)

## 2021-04-29 NOTE — Progress Notes (Signed)
============================================================ °-   Test results slightly outside the reference range are not unusual. If there is anything important, I will review this with you,  otherwise it is considered normal test values.  If you have further questions,  please do not hesitate to contact me at the office or via My Chart.  ============================================================ ============================================================  - Total Chol = 165  -  Excellent   - Very low risk for Heart Attack  / Stroke ============================================================ ============================================================  -  A1c - Normal - No Diabetes   - Great   ! ============================================================ ============================================================  -  Vitamin D = 59  - OK - Please keep dose same  ============================================================ ============================================================  - All Else - CBC - Kidneys - Electrolytes - Liver - Magnesium & Thyroid    - all  Normal / OK  ============================================================ ============================================================

## 2021-05-08 ENCOUNTER — Ambulatory Visit (INDEPENDENT_AMBULATORY_CARE_PROVIDER_SITE_OTHER): Payer: Medicare Other

## 2021-05-08 DIAGNOSIS — I495 Sick sinus syndrome: Secondary | ICD-10-CM

## 2021-05-12 ENCOUNTER — Telehealth: Payer: Self-pay | Admitting: Cardiovascular Disease

## 2021-05-12 ENCOUNTER — Other Ambulatory Visit (HOSPITAL_COMMUNITY): Payer: Medicare Other

## 2021-05-12 ENCOUNTER — Encounter (HOSPITAL_COMMUNITY): Payer: Self-pay

## 2021-05-12 ENCOUNTER — Inpatient Hospital Stay (HOSPITAL_COMMUNITY)
Admission: EM | Admit: 2021-05-12 | Discharge: 2021-05-14 | DRG: 291 | Disposition: A | Discharge status: Home Health | Payer: Medicare Other | Attending: Internal Medicine | Admitting: Internal Medicine

## 2021-05-12 ENCOUNTER — Other Ambulatory Visit: Payer: Self-pay

## 2021-05-12 ENCOUNTER — Emergency Department (HOSPITAL_COMMUNITY): Payer: Medicare Other

## 2021-05-12 DIAGNOSIS — Z66 Do not resuscitate: Secondary | ICD-10-CM | POA: Diagnosis present

## 2021-05-12 DIAGNOSIS — I110 Hypertensive heart disease with heart failure: Principal | ICD-10-CM | POA: Diagnosis present

## 2021-05-12 DIAGNOSIS — Z8249 Family history of ischemic heart disease and other diseases of the circulatory system: Secondary | ICD-10-CM

## 2021-05-12 DIAGNOSIS — I482 Chronic atrial fibrillation, unspecified: Secondary | ICD-10-CM | POA: Diagnosis present

## 2021-05-12 DIAGNOSIS — R0602 Shortness of breath: Secondary | ICD-10-CM

## 2021-05-12 DIAGNOSIS — Z79899 Other long term (current) drug therapy: Secondary | ICD-10-CM

## 2021-05-12 DIAGNOSIS — Z20822 Contact with and (suspected) exposure to covid-19: Secondary | ICD-10-CM | POA: Diagnosis present

## 2021-05-12 DIAGNOSIS — Z888 Allergy status to other drugs, medicaments and biological substances status: Secondary | ICD-10-CM

## 2021-05-12 DIAGNOSIS — I509 Heart failure, unspecified: Secondary | ICD-10-CM

## 2021-05-12 DIAGNOSIS — Z85828 Personal history of other malignant neoplasm of skin: Secondary | ICD-10-CM

## 2021-05-12 DIAGNOSIS — E785 Hyperlipidemia, unspecified: Secondary | ICD-10-CM | POA: Diagnosis present

## 2021-05-12 DIAGNOSIS — J984 Other disorders of lung: Secondary | ICD-10-CM | POA: Diagnosis present

## 2021-05-12 DIAGNOSIS — I5033 Acute on chronic diastolic (congestive) heart failure: Secondary | ICD-10-CM | POA: Diagnosis not present

## 2021-05-12 DIAGNOSIS — E876 Hypokalemia: Secondary | ICD-10-CM

## 2021-05-12 DIAGNOSIS — Z8673 Personal history of transient ischemic attack (TIA), and cerebral infarction without residual deficits: Secondary | ICD-10-CM

## 2021-05-12 DIAGNOSIS — Z881 Allergy status to other antibiotic agents status: Secondary | ICD-10-CM

## 2021-05-12 DIAGNOSIS — I10 Essential (primary) hypertension: Secondary | ICD-10-CM | POA: Diagnosis present

## 2021-05-12 DIAGNOSIS — G629 Polyneuropathy, unspecified: Secondary | ICD-10-CM | POA: Diagnosis present

## 2021-05-12 DIAGNOSIS — I495 Sick sinus syndrome: Secondary | ICD-10-CM | POA: Diagnosis present

## 2021-05-12 DIAGNOSIS — Z950 Presence of cardiac pacemaker: Secondary | ICD-10-CM

## 2021-05-12 DIAGNOSIS — Z79811 Long term (current) use of aromatase inhibitors: Secondary | ICD-10-CM

## 2021-05-12 DIAGNOSIS — Z880 Allergy status to penicillin: Secondary | ICD-10-CM

## 2021-05-12 DIAGNOSIS — R5381 Other malaise: Secondary | ICD-10-CM | POA: Diagnosis present

## 2021-05-12 DIAGNOSIS — Z7901 Long term (current) use of anticoagulants: Secondary | ICD-10-CM

## 2021-05-12 DIAGNOSIS — Z91018 Allergy to other foods: Secondary | ICD-10-CM

## 2021-05-12 DIAGNOSIS — I4821 Permanent atrial fibrillation: Secondary | ICD-10-CM | POA: Diagnosis present

## 2021-05-12 LAB — CBC WITH DIFFERENTIAL/PLATELET
Abs Immature Granulocytes: 0.07 10*3/uL (ref 0.00–0.07)
Basophils Absolute: 0.1 10*3/uL (ref 0.0–0.1)
Basophils Relative: 1 %
Eosinophils Absolute: 0.2 10*3/uL (ref 0.0–0.5)
Eosinophils Relative: 3 %
HCT: 40.7 % (ref 36.0–46.0)
Hemoglobin: 13 g/dL (ref 12.0–15.0)
Immature Granulocytes: 1 %
Lymphocytes Relative: 26 %
Lymphs Abs: 2.2 10*3/uL (ref 0.7–4.0)
MCH: 30.3 pg (ref 26.0–34.0)
MCHC: 31.9 g/dL (ref 30.0–36.0)
MCV: 94.9 fL (ref 80.0–100.0)
Monocytes Absolute: 0.8 10*3/uL (ref 0.1–1.0)
Monocytes Relative: 10 %
Neutro Abs: 4.9 10*3/uL (ref 1.7–7.7)
Neutrophils Relative %: 59 %
Platelets: 228 10*3/uL (ref 150–400)
RBC: 4.29 MIL/uL (ref 3.87–5.11)
RDW: 12.7 % (ref 11.5–15.5)
WBC: 8.2 10*3/uL (ref 4.0–10.5)
nRBC: 0 % (ref 0.0–0.2)

## 2021-05-12 LAB — COMPREHENSIVE METABOLIC PANEL
ALT: 14 U/L (ref 0–44)
AST: 15 U/L (ref 15–41)
Albumin: 4 g/dL (ref 3.5–5.0)
Alkaline Phosphatase: 110 U/L (ref 38–126)
Anion gap: 8 (ref 5–15)
BUN: 27 mg/dL — ABNORMAL HIGH (ref 8–23)
CO2: 29 mmol/L (ref 22–32)
Calcium: 9.1 mg/dL (ref 8.9–10.3)
Chloride: 103 mmol/L (ref 98–111)
Creatinine, Ser: 0.57 mg/dL (ref 0.44–1.00)
GFR, Estimated: >60 mL/min (ref >60)
Glucose, Bld: 87 mg/dL (ref 70–99)
Potassium: 3.9 mmol/L (ref 3.5–5.1)
Sodium: 140 mmol/L (ref 135–145)
Total Bilirubin: 0.7 mg/dL (ref 0.3–1.2)
Total Protein: 7.2 g/dL (ref 6.5–8.1)

## 2021-05-12 LAB — CUP PACEART REMOTE DEVICE CHECK
Battery Impedance: 5529 Ohm
Battery Remaining Longevity: 10 mo
Battery Voltage: 2.66 V
Brady Statistic RV Percent Paced: 91 %
Date Time Interrogation Session: 20230109102125
Implantable Lead Implant Date: 20110428
Implantable Lead Implant Date: 20110428
Implantable Lead Location: 753859
Implantable Lead Location: 753860
Implantable Lead Model: 4092
Implantable Lead Model: 4592
Implantable Pulse Generator Implant Date: 20110428
Lead Channel Impedance Value: 67 Ohm
Lead Channel Impedance Value: 737 Ohm
Lead Channel Pacing Threshold Amplitude: 0.875 V
Lead Channel Pacing Threshold Pulse Width: 0.4 ms
Lead Channel Setting Pacing Amplitude: 2.5 V
Lead Channel Setting Pacing Pulse Width: 0.4 ms
Lead Channel Setting Sensing Sensitivity: 4 mV

## 2021-05-12 LAB — BRAIN NATRIURETIC PEPTIDE: B Natriuretic Peptide: 167.7 pg/mL — ABNORMAL HIGH (ref 0.0–100.0)

## 2021-05-12 LAB — RESP PANEL BY RT-PCR (FLU A&B, COVID) ARPGX2
Influenza A by PCR: NEGATIVE
Influenza B by PCR: NEGATIVE
SARS Coronavirus 2 by RT PCR: NEGATIVE

## 2021-05-12 LAB — TROPONIN I (HIGH SENSITIVITY)
Troponin I (High Sensitivity): 10 ng/L (ref <18)
Troponin I (High Sensitivity): 12 ng/L (ref <18)

## 2021-05-12 LAB — PROTIME-INR
INR: 1.8 — ABNORMAL HIGH (ref 0.8–1.2)
Prothrombin Time: 21 seconds — ABNORMAL HIGH (ref 11.4–15.2)

## 2021-05-12 MED ORDER — WARFARIN - PHARMACIST DOSING INPATIENT: Freq: Every day | Status: DC

## 2021-05-12 MED ORDER — ACETAMINOPHEN 650 MG RE SUPP: 650.0000 mg | Freq: Four times a day (QID) | RECTAL | Status: DC | PRN

## 2021-05-12 MED ORDER — FUROSEMIDE 10 MG/ML IJ SOLN
20.0000 mg | Freq: Once | INTRAMUSCULAR | Status: AC
Start: 2021-05-12 — End: 2021-05-12
  Administered 2021-05-12: 20 mg via INTRAVENOUS
  Filled 2021-05-12: qty 4

## 2021-05-12 MED ORDER — DILTIAZEM HCL ER COATED BEADS 240 MG PO CP24
240.0000 mg | ORAL_CAPSULE | Freq: Every day | ORAL | Status: DC
  Administered 2021-05-13 – 2021-05-14 (×2): 240 mg via ORAL
  Filled 2021-05-12 (×2): qty 1

## 2021-05-12 MED ORDER — ANASTROZOLE 1 MG PO TABS
1.0000 mg | ORAL_TABLET | Freq: Every day | ORAL | Status: DC
  Administered 2021-05-13 – 2021-05-14 (×2): 1 mg via ORAL
  Filled 2021-05-12 (×2): qty 1

## 2021-05-12 MED ORDER — FUROSEMIDE 10 MG/ML IJ SOLN
20.0000 mg | Freq: Two times a day (BID) | INTRAMUSCULAR | Status: DC
  Administered 2021-05-13 – 2021-05-14 (×3): 20 mg via INTRAVENOUS
  Filled 2021-05-12 (×3): qty 2

## 2021-05-12 MED ORDER — ACETAMINOPHEN 325 MG PO TABS
650.0000 mg | ORAL_TABLET | Freq: Four times a day (QID) | ORAL | Status: DC | PRN
  Administered 2021-05-13: 650 mg via ORAL
  Filled 2021-05-12: qty 2

## 2021-05-12 MED ORDER — IRBESARTAN 300 MG PO TABS
300.0000 mg | ORAL_TABLET | Freq: Every day | ORAL | Status: DC
  Filled 2021-05-12: qty 1

## 2021-05-12 MED ORDER — HYDROXYZINE HCL 25 MG PO TABS
50.0000 mg | ORAL_TABLET | Freq: Every evening | ORAL | Status: DC | PRN
  Administered 2021-05-13: 50 mg via ORAL
  Filled 2021-05-12: qty 2

## 2021-05-12 MED ORDER — ONDANSETRON HCL 4 MG/2ML IJ SOLN: 4.0000 mg | Freq: Four times a day (QID) | INTRAMUSCULAR | Status: DC | PRN

## 2021-05-12 MED ORDER — HYDRALAZINE HCL 50 MG PO TABS
50.0000 mg | ORAL_TABLET | Freq: Four times a day (QID) | ORAL | Status: DC | PRN
  Administered 2021-05-12: 50 mg via ORAL
  Filled 2021-05-12: qty 1

## 2021-05-12 MED ORDER — ONDANSETRON HCL 4 MG PO TABS: 4.0000 mg | ORAL_TABLET | Freq: Four times a day (QID) | ORAL | Status: DC | PRN

## 2021-05-12 MED ORDER — SENNOSIDES-DOCUSATE SODIUM 8.6-50 MG PO TABS: 1.0000 | ORAL_TABLET | Freq: Every evening | ORAL | Status: DC | PRN

## 2021-05-12 MED ORDER — IRBESARTAN 300 MG PO TABS
300.0000 mg | ORAL_TABLET | Freq: Every day | ORAL | Status: DC
  Administered 2021-05-13 – 2021-05-14 (×2): 300 mg via ORAL
  Filled 2021-05-12 (×2): qty 1

## 2021-05-12 MED ORDER — POTASSIUM CHLORIDE CRYS ER 20 MEQ PO TBCR
20.0000 meq | EXTENDED_RELEASE_TABLET | Freq: Every day | ORAL | Status: DC
  Administered 2021-05-13 – 2021-05-14 (×2): 20 meq via ORAL
  Filled 2021-05-12 (×2): qty 1

## 2021-05-12 MED ORDER — IOHEXOL 350 MG/ML SOLN
80.0000 mL | Freq: Once | INTRAVENOUS | Status: AC | PRN
  Administered 2021-05-12: 80 mL via INTRAVENOUS

## 2021-05-12 MED ORDER — WARFARIN SODIUM 5 MG PO TABS
5.0000 mg | ORAL_TABLET | Freq: Once | ORAL | Status: AC
  Administered 2021-05-12: 5 mg via ORAL
  Filled 2021-05-12: qty 1

## 2021-05-12 MED ORDER — SODIUM CHLORIDE 0.9% FLUSH
3.0000 mL | Freq: Two times a day (BID) | INTRAVENOUS | Status: DC
  Administered 2021-05-12 – 2021-05-14 (×4): 3 mL via INTRAVENOUS

## 2021-05-12 NOTE — ED Triage Notes (Signed)
Patient reports that she feels her heart racing, dizziness and SOB x 1 week. Patient states worse today. Patient has a pacemaker. Patient's niece reports that the patient's cardiologist stated that she was scheduled to have a battery change to the pacemaker sometime this year.

## 2021-05-12 NOTE — Telephone Encounter (Signed)
I spoke with patient and noted she was grunting and having trouble getting full sentences out.I asked her to check her BP/HR and her machine kept saying error.She has been SOB for the past week but these 2 past days it has gotten worse. She denies CP but says something "feels wrong in there" I advised she call 911 but she wants her niece to take her to [facility] ED.   I will [place] Dr.[name]

## 2021-05-12 NOTE — Progress Notes (Signed)
ANTICOAGULATION CONSULT NOTE - Initial Consult  Pharmacy Consult for warfarin Indication: atrial fibrillation  Allergies  Allergen Reactions   Latex Itching   Ace Inhibitors Other (See Comments)    Unknown reaction   Augmentin [Amoxicillin-Pot Clavulanate] Other (See Comments)   Ciprofloxacin Other (See Comments)   Levaquin [Levofloxacin In D5w] Other (See Comments)   Zocor [Simvastatin] Other (See Comments)   Acrylic Polymer [Carbomer] Itching   Chocolate Other (See Comments)    migraine's    Gabapentin Other (See Comments)    Unsteady gait     Patient Measurements: Height: 5' 4.5" (163.8 cm) Weight: 65.8 kg (145 lb) IBW/kg (Calculated) : 55.85   Vital Signs: Temp: 98.3 F (36.8 C) (01/10 1259) Temp Source: Oral (01/10 1259) BP: 192/65 (01/10 2130) Pulse Rate: 63 (01/10 2130)  Labs: Recent Labs    05/12/21 1504 05/12/21 1855  HGB 13.0  --   HCT 40.7  --   PLT 228  --   LABPROT  --  21.0*  INR  --  1.8*  CREATININE 0.57  --   TROPONINIHS 10 12    Estimated Creatinine Clearance: 40.4 mL/min (by C-G formula based on SCr of 0.57 mg/dL).   Medical History: Past Medical History:  Diagnosis Date   Anxiety    Atrial fib/flutter, transient    Atrial fibrillation, chronic (HCC)    CHF (congestive heart failure) ([place]) 10/29/2009   Echo - EF >55%; normal LV size and systolic function; unable to assess diastolic fcn due to E/A fusion, pulmonary vein flow pattern suggests elevated filling pressure; marked biatrail dilation, mild/mod tricuspid regurgitation; mod pulmonary htn; mild/mod mitral regurgitation; although echocardiographic features are incomplete findings suggest possible infiltrative cardiomyopathy (maybe amyloidosi   Coronary artery disease 03/19/2002   R/P Cardiolite - EF 76%; nromal static and dynamic myocardial perfusion images; normal wall motion and endocardial thickening in all vascular territories   Dyspnea    Dysrhythmia    Facial numbness  12/26/2008   carotid doppler - R and L ICAs 0-49% diameter reduction (velocities suggest low end of scale)   Hypertension    Pacemaker    Peripheral neuropathy    Pneumonia 2017   Skin cancer    s/p surgical removal.   Stroke ([place]) 04/2017   TIA (transient ischemic attack)     Medications:  Home dose warfarin 3mg  on Sun, Tues, Thurs and 4.5mg  on Mon, Wed, Fri, Sat)  Assessment: 88 y.o. female with medical history significant for permanent atrial fibrillation on Coumadin, chronic diastolic CHF (last EF 62-56% by TTE 04/2018), SSS s/p PPM, hypertension who presented to the ED for evaluation of shortness of breath.  Pharmacy consulted to dose warfarin.  LD 1/9  INR 1.8 CBC WNL  Goal of Therapy:  INR 2-3    Plan:  Warfarin 5mg  tonight Daily INR  Dolly Rias RPh 05/12/2021, 9:55 PM

## 2021-05-12 NOTE — H&P (Signed)
History and Physical    Misty Blackwell ULA:[id number] DOB: [birth year] DOA: 05/12/2021  PCP: [name], MD  Patient coming from: Home  I have personally briefly reviewed patient's old medical records in [place]  Chief Complaint: Shortness of breath  HPI: Misty Blackwell is a 88 y.o. female with medical history significant for permanent atrial fibrillation on Coumadin, chronic diastolic CHF (last EF 21-22% by TTE 04/2018), SSS s/p PPM, hypertension who presented to the ED for evaluation of shortness of breath.  Patient states that she has been having some progressive shortness of breath for about 3 weeks now.  She says her breathing issues have worsened over the last 3 days.  She has been having orthopnea, having to sleep upright in a recliner these last 3 nights when she usually sleeps in bed laying on her side.  She is waking up short of breath while sleeping.  She reports associated palpitations.  She denies any chest pain.  She reports chronic nonproductive cough related to postnasal drip which is unchanged from baseline.  She reports good urine output without dysuria.  She denies any nausea, vomiting, abdominal pain.  Patient states that her blood pressure has been very elevated at home.  She says she normally has SBP in the 120s.  ED Course:  Initial vitals showed BP 172/71, pulse 69, RR 18, temp 98.3 F, SPO2 100% on room air.  Labs show sodium 140, potassium 3.9, bicarb 29, BUN 27, creatinine 0.57, serum glucose 87, LFTs within normal limits, WBC 8.2, hemoglobin 13.0, platelets 228,000, troponin 10 > 12, BNP 167.7, INR 1.8.  SARS-CoV-2 and influenza PCR negative.  2 view chest x-ray shows chronically hyperinflated lungs with coarse interstitial markings, no focal consolidation, edema, effusion.  CTA chest PE study negative for evidence of central or segment pulmonary embolus.  No acute intrathoracic abnormality.  Patient was given IV Lasix 20 mg once.   The hospitalist service was consulted to admit for further evaluation and management.  Review of Systems: All systems reviewed and are negative except as documented in history of present illness above.   Past Medical History:  Diagnosis Date   Anxiety    Atrial fib/flutter, transient    Atrial fibrillation, chronic (HCC)    CHF (congestive heart failure) ([place]) 10/29/2009   Echo - EF >55%; normal LV size and systolic function; unable to assess diastolic fcn due to E/A fusion, pulmonary vein flow pattern suggests elevated filling pressure; marked biatrail dilation, mild/mod tricuspid regurgitation; mod pulmonary htn; mild/mod mitral regurgitation; although echocardiographic features are incomplete findings suggest possible infiltrative cardiomyopathy (maybe amyloidosi   Coronary artery disease 03/19/2002   R/P Cardiolite - EF 76%; nromal static and dynamic myocardial perfusion images; normal wall motion and endocardial thickening in all vascular territories   Dyspnea    Dysrhythmia    Facial numbness 12/26/2008   carotid doppler - R and L ICAs 0-49% diameter reduction (velocities suggest low end of scale)   Hypertension    Pacemaker    Peripheral neuropathy    Pneumonia 2017   Skin cancer    s/p surgical removal.   Stroke ([place]) 04/2017   TIA (transient ischemic attack)     Past Surgical History:  Procedure Laterality Date   ABDOMINAL HYSTERECTOMY     APPENDECTOMY     BREAST LUMPECTOMY WITH RADIOACTIVE SEED LOCALIZATION Right 12/13/2019   Procedure: RIGHT BREAST LUMPECTOMY WITH RADIOACTIVE SEED LOCALIZATION;  Surgeon: Donnie Mesa, MD;  Location: [place];  Service: General;  Laterality: Right;  LMA VS MAC   BREAST SURGERY Left 1949   CARDIAC CATHETERIZATION  08/06/2005   minimal coronary disease predominant RCA; no significant atherosclerosis; new onset sick sinus syndrome and atrial flutter w/ ventricular response, controlled on med therapy; systemic HTN, normal renal arteries    CARDIOVERSION  11/19/2009   successful DCCV from AF to sinus type rhythm   CHOLECYSTECTOMY     EYE SURGERY Bilateral 2013   Skin cancer resection     TONSILLECTOMY  1938    Social History:  reports that she has never smoked. She has never used smokeless tobacco. She reports that she does not drink alcohol and does not use drugs.  Allergies  Allergen Reactions   Latex Itching   Ace Inhibitors Other (See Comments)    Unknown reaction   Augmentin [Amoxicillin-Pot Clavulanate] Other (See Comments)   Ciprofloxacin Other (See Comments)   Levaquin [Levofloxacin In D5w] Other (See Comments)   Zocor [Simvastatin] Other (See Comments)   Acrylic Polymer [Carbomer] Itching   Chocolate Other (See Comments)    migraine's    Gabapentin Other (See Comments)    Unsteady gait     Family History  Problem Relation Age of Onset   Heart disease Mother    Diabetes Mother    Heart attack Father    Heart disease Father    Cirrhosis Brother      Prior to Admission medications   Medication Sig Start Date End Date Taking? Authorizing Provider  acidophilus (RISAQUAD) CAPS capsule Take 1 capsule by mouth daily.    [provider]  anastrozole (ARIMIDEX) 1 MG tablet Take 1 tablet (1 mg total) by mouth daily. 02/19/21   [name], MD  Ascorbic Acid (VITAMIN C) 1000 MG tablet Take 1,000 mg by mouth daily.     [provider]  Cholecalciferol (VITAMIN D3) 50 MCG (2000 UT) capsule Take by mouth. 04/09/20   [provider]  clotrimazole-betamethasone (LOTRISONE) cream Apply 1 application topically 2 (two) times daily. 07/15/20   Garnet Sierras, NP  Cyanocobalamin (VITAMIN B 12 PO) Take 1,000 mcg by mouth daily.     [provider]  diltiazem (CARDIZEM CD) 240 MG 24 hr capsule Take 1 capsule (240 mg total) by mouth daily. 03/02/21   [name], Mihai, MD  furosemide (LASIX) 20 MG tablet Take 1 tablet (20 mg total) by mouth daily. 03/07/20   Bhagat, Bhavinkumar, PA  GNP  IRON 200 (65 Fe) MG TABS Take by mouth. 04/09/20   [provider]  hydrOXYzine (ATARAX/VISTARIL) 50 MG tablet TAKE 1 TO 2 TABLETS BY MOUTH EVERY NIGHT 1 HOUR BEFORE BEDTIME AS NEEDED FOR SLEEP 10/06/20   Liane Comber, NP  Magnesium 250 MG TABS Take 250 mg by mouth daily.    [provider]  potassium chloride SA (KLOR-CON) 20 MEQ tablet Take  1 tablet  Daily  for Potassium 08/03/20   [name], MD  telmisartan (MICARDIS) 80 MG tablet TAKE 1 TABLET(80 MG) BY MOUTH DAILY 05/20/20   [name], [name], MD  warfarin (COUMADIN) 3 MG tablet TAKE 1 TO 1 1/2 TABLET BY MOUTH AS DIRECTED 03/16/21   Magda Bernheim, NP  zinc gluconate 50 MG tablet Take 50 mg by mouth daily.    [provider]    Physical Exam: Vitals:   05/12/21 1638 05/12/21 1937 05/12/21 2030 05/12/21 2130  BP: (!) 168/80 (!) 191/84 (!) 183/81 (!) 192/65  Pulse: 62 79 78 63  Resp: _0 16  Temp:      TempSrc:      SpO2: 98% 96% 96% 94%  Weight:      Height:       Constitutional: Elderly woman resting in bed with head elevated, NAD, calm, comfortable Eyes: PERRL, lids and conjunctivae normal ENMT: Mucous membranes are moist. Posterior pharynx clear of any exudate or lesions.Normal dentition.  Neck: normal, supple, no masses. Respiratory: Bibasilar inspiratory crackles. Normal respiratory effort. No accessory muscle use.  Cardiovascular: Regular rate and rhythm, no murmurs / rubs / gallops.  Trace bilateral lower extremity edema. 2+ pedal pulses.  PPM in place left upper chest wall. Abdomen: no tenderness, no masses palpated. No hepatosplenomegaly. Bowel sounds positive.  Musculoskeletal: no clubbing / cyanosis. No joint deformity upper and lower extremities. Good ROM, no contractures. Normal muscle tone.  Skin: no rashes, lesions, ulcers. No induration Neurologic: CN 2-12 grossly intact. Sensation intact. Strength 5/5 in all 4.  Psychiatric: Normal judgment and insight. Alert and oriented x 3.  Normal mood.   Labs on Admission: I have personally reviewed following labs and imaging studies  CBC: Recent Labs  Lab 05/12/21 1504  WBC 8.2  NEUTROABS 4.9  HGB 13.0  HCT 40.7  MCV 94.9  PLT 277   Basic Metabolic Panel: Recent Labs  Lab 05/12/21 1504  NA 140  K 3.9  CL 103  CO2 29  GLUCOSE 87  BUN 27*  CREATININE 0.57  CALCIUM 9.1   GFR: Estimated Creatinine Clearance: 40.4 mL/min (by C-G formula based on SCr of 0.57 mg/dL). Liver Function Tests: Recent Labs  Lab 05/12/21 1504  AST 15  ALT 14  ALKPHOS 110  BILITOT 0.7  PROT 7.2  ALBUMIN 4.0   No results for input(s): LIPASE, AMYLASE in the last 168 hours. No results for input(s): AMMONIA in the last 168 hours. Coagulation Profile: Recent Labs  Lab 05/12/21 1855  INR 1.8*   Cardiac Enzymes: No results for input(s): CKTOTAL, CKMB, CKMBINDEX, TROPONINI in the last 168 hours. BNP (last 3 results) No results for input(s): PROBNP in the last 8760 hours. HbA1C: No results for input(s): HGBA1C in the last 72 hours. CBG: No results for input(s): GLUCAP in the last 168 hours. Lipid Profile: No results for input(s): CHOL, HDL, LDLCALC, TRIG, CHOLHDL, LDLDIRECT in the last 72 hours. Thyroid Function Tests: No results for input(s): TSH, T4TOTAL, FREET4, T3FREE, THYROIDAB in the last 72 hours. Anemia Panel: No results for input(s): VITAMINB12, FOLATE, FERRITIN, TIBC, IRON, RETICCTPCT in the last 72 hours. Urine analysis:    Component Value Date/Time   COLORURINE YELLOW 04/28/2021 [place] 04/28/2021 1532   LABSPEC 1.022 04/28/2021 1532   PHURINE 6.0 04/28/2021 1532   GLUCOSEU NEGATIVE 04/28/2021 1532   HGBUR NEGATIVE 04/28/2021 [place] 06/22/2019 1634   KETONESUR NEGATIVE 04/28/2021 1532   PROTEINUR NEGATIVE 04/28/2021 1532   UROBILINOGEN 1.0 08/24/2013 1509   NITRITE NEGATIVE 04/28/2021 [place] 04/28/2021 1532    Radiological Exams on  Admission: DG Chest 2 View  Result Date: 05/12/2021 CLINICAL DATA:  sob EXAM: CHEST - 2 VIEW COMPARISON:  April 05, 2020 FINDINGS: Similar chronically hyperinflated lungs with coarse interstitial markings. No consolidation. No visible pleural effusions or pneumothorax. Cardiomediastinal silhouette is similar. Left subclavian approach pacemaker. IMPRESSION: Similar chronically hyperinflated lungs with coarse interstitial markings. No evidence of acute cardiopulmonary disease. Electronically Signed   By: Margaretha Sheffield M.D.   On: 05/12/2021 14:29   CT Angio Chest  PE W and/or Wo Contrast  Result Date: 05/12/2021 CLINICAL DATA:  Pulmonary embolism (PE) suspected, high prob. Dizziness EXAM: CT ANGIOGRAPHY CHEST WITH CONTRAST TECHNIQUE: Multidetector CT imaging of the chest was performed using the standard protocol during bolus administration of intravenous contrast. Multiplanar CT image reconstructions and MIPs were obtained to evaluate the vascular anatomy. RADIATION DOSE REDUCTION: This exam was performed according to the departmental dose-optimization program which includes automated exposure control, adjustment of the mA and/or kV according to patient size and/or use of iterative reconstruction technique. CONTRAST:  59m OMNIPAQUE IOHEXOL 350 MG/ML SOLN COMPARISON:  CT chest 07/15/2005 FINDINGS: Cardiovascular: Left chest wall cardiac pacemaker with leads in grossly appropriate position satisfactory opacification of the pulmonary arteries to the segmental level. No evidence of pulmonary embolism. Limited evaluation of the subsegmental level due to respiratory motion artifact. Mildly enlarged heart size. No significant pericardial effusion. The thoracic aorta is normal in caliber. Mild atherosclerotic plaque of the thoracic aorta. At least 3 vessel coronary artery calcifications. Mediastinum/Nodes: Redemonstration of pericentimeter mediastinal lymph nodes. No enlarged mediastinal, hilar, or axillary lymph  nodes. Thyroid gland, trachea, and esophagus demonstrate no significant findings. Lungs/Pleura: Limited evaluation due to respiratory motion artifact biapical pleural/pulmonary scarring. No focal consolidation. Subpleural pulmonary micronodule. No pulmonary mass. No pleural effusion. No pneumothorax. Upper Abdomen: Status post cholecystectomy.  No acute abnormality. Musculoskeletal: No chest wall abnormality. No suspicious lytic or blastic osseous lesions. No acute displaced fracture. Multilevel degenerative changes of the spine. Review of the MIP images confirms the above findings. IMPRESSION: 1. No central or segment pulmonary embolus. Limited evaluation of the subsegmental level due to respiratory motion artifact. 2. No acute intrathoracic abnormality. Electronically Signed   By: MIven FinnM.D.   On: 05/12/2021 19:38   CUP PACEART REMOTE DEVICE CHECK  Result Date: 05/12/2021 Scheduled remote reviewed. Normal device function.  2 VHR's 7 & 9 beats runs, no new noise noted this remote Next remote 91 days. LA   EKG: Personally reviewed. V paced rhythm.  Similar to prior.  Assessment/Plan Principal Problem:   Acute on chronic diastolic CHF (congestive heart failure) (HCC) Active Problems:   Essential hypertension   Chronic atrial fibrillation (HCC)   TSukhmani Fetherolfis a 88y.o. female with medical history significant for permanent atrial fibrillation on Coumadin, chronic diastolic CHF (last EF 677-82%by TTE 04/2018), SSS s/p PPM, hypertension who is admitted with acute on chronic diastolic CHF.  Acute on chronic diastolic CHF: Patient presenting with DOE, orthopnea, PND, trace peripheral edema consistent with acute on chronic diastolic CHF exacerbation.  She is saturating well on room air.  Troponin is negative.  Symptoms improving with initial IV Lasix however she does not feel back to baseline yet. -Continue IV Lasix 20 mg twice daily -Monitor strict I/O's and daily  weights -Supplemental oxygen if needed  Permanent atrial fibrillation: In paced rhythm on admission.  INR 1.8.  Continue Coumadin per pharmacy.  Continue diltiazem 240 mg daily.  Accelerated hypertension: BP significantly elevated compared to baseline.  Likely contributing to acute CHF.  Resume home diltiazem and telmisartan.  Hydralazine if needed.  SSS s/p PPM: Remains in paced rhythm on admission.  DVT prophylaxis: Coumadin per pharmacy Code Status: DNR, confirmed with patient on admission Family Communication: Discussed with patient, she has discussed with family Disposition Plan: From home and likely discharge to home pending clinical progress.  Consults called: None Level of care: Telemetry Admission status:   Status is: Observation  The patient remains OBS  appropriate and will d/c before 2 midnights.  Zada Finders MD [facility]  If 7PM-7AM, please contact night-coverage [email or web address]  05/12/2021, 9:50 PM

## 2021-05-12 NOTE — ED Notes (Addendum)
Pt was able to ambulate around the room with SpO2 staying between 92-94 during ambulation.

## 2021-05-12 NOTE — ED Provider Triage Note (Signed)
Emergency Medicine Provider Triage Evaluation Note  Misty Blackwell , a 88 y.o. female  was evaluated in triage.  Pt complains of shortness of breath, dizziness, and palpitations.  States that she has had the symptoms over the last week.  Shortness of breath has gotten progressively worse over this time.  Patient states that she is unable to ambulate due to her shortness of breath.  Patient denies any associated chest pain or syncope.  Patient's family member at bedside reports that patient has a pacemaker and they were told that the battery on the pacemaker needs to be placed sometime this year.  Review of Systems  Positive: Shortness of breath, dizziness, palpitations, nonproductive cough Negative: Fever, chills, chest pain, leg swelling, syncope  Physical Exam  BP (!) 172/71 (BP Location: Left Arm)    Pulse 69    Temp 98.3 F (36.8 C) (Oral)    Resp 18    Ht 5' 4.5" (1.638 m)    Wt 65.8 kg    SpO2 100%    BMI 24.50 kg/m  Gen:   Awake, no distress   Resp:  Normal effort, lungs clear to auscultation bilaterally MSK:   Moves extremities without difficulty, no swelling to bilateral lower extremities Other:    Medical Decision Making  Medically screening exam initiated at 1:29 PM.  Appropriate orders placed.  [name] was informed that the remainder of the evaluation will be completed by another provider, this initial triage assessment does not replace that evaluation, and the importance of remaining in the ED until their evaluation is complete.     Loni Beckwith, PA-C 05/12/21 1330

## 2021-05-12 NOTE — Telephone Encounter (Signed)
° °  Pt c/o Shortness Of Breath: STAT if SOB developed within the last 24 hours or pt is noticeably SOB on the phone  1. Are you currently SOB (can you hear that pt is SOB on the phone)? No    2. How long have you been experiencing SOB? Its been worst for a week now   3. Are you SOB when sitting or when up moving around? Sitting around   4. Are you currently experiencing any other symptoms?   Pt said her sob is getting worst

## 2021-05-12 NOTE — ED Provider Notes (Signed)
[facility] DEPT Provider Note   CSN: [id number] Arrival date & time: 05/12/21  1249     History  Chief Complaint  Patient presents with   Shortness of Breath   Hypertension   Dizziness    Axelle Szwed is a 88 y.o. female history of hypertension, hyperlipidemia, A. fib Coumadin and Cardizem, sick sinus syndrome status post pacemaker here presenting with shortness of breath.  Patient states that for the last week or so she has shortness of breath with minimal exertion.  Patient states that when she walks to the bathroom, she gets very short of breath. Denies any chest pain.  She felt lightheaded dizzy as well.  She called her cardiologist and was sent here for further evaluation  The history is provided by the patient.      Home Medications Prior to Admission medications   Medication Sig Start Date End Date Taking? Authorizing Provider  acidophilus (RISAQUAD) CAPS capsule Take 1 capsule by mouth daily.   Yes [provider]  anastrozole (ARIMIDEX) 1 MG tablet Take 1 tablet (1 mg total) by mouth daily. 02/19/21  Yes [name], MD  Ascorbic Acid (VITAMIN C) 1000 MG tablet Take 1,000 mg by mouth daily.    Yes [provider]  Cholecalciferol (VITAMIN D3) 50 MCG (2000 UT) capsule Take 2,000 Units by mouth daily. 04/09/20  Yes [provider]  Cyanocobalamin (VITAMIN B 12 PO) Take 1,000 mcg by mouth daily.    Yes [provider]  diltiazem (CARDIZEM CD) 240 MG 24 hr capsule Take 1 capsule (240 mg total) by mouth daily. 03/02/21  Yes [name], Mihai, MD  furosemide (LASIX) 20 MG tablet Take 1 tablet (20 mg total) by mouth daily. Patient taking differently: Take 20 mg by mouth daily as needed for fluid. 03/07/20  Yes Bhagat, Bhavinkumar, PA  GNP IRON 200 (65 Fe) MG TABS Take 1 tablet by mouth daily. 04/09/20  Yes [provider]  hydrOXYzine (ATARAX/VISTARIL) 50 MG tablet TAKE 1 TO 2 TABLETS BY MOUTH EVERY  NIGHT 1 HOUR BEFORE BEDTIME AS NEEDED FOR SLEEP Patient taking differently: Take 50 mg by mouth at bedtime. 10/06/20  Yes Liane Comber, NP  Magnesium 250 MG TABS Take 250 mg by mouth daily.   Yes [provider]  potassium chloride SA (KLOR-CON) 20 MEQ tablet Take  1 tablet  Daily  for Potassium 08/03/20  Yes [name], MD  telmisartan (MICARDIS) 80 MG tablet TAKE 1 TABLET(80 MG) BY MOUTH DAILY 05/20/20  Yes [name], Mihai, MD  warfarin (COUMADIN) 3 MG tablet TAKE 1 TO 1 1/2 TABLET BY MOUTH AS DIRECTED Patient taking differently: Take 3-4.5 mg by mouth as directed. Take 1 tablet (3 mg) on (Sun, Tues, Thurs) & Take 1.5 tablets (4.5 mg) on (Mon, Wed, Fri, Sat) 03/16/21  Yes Magda Bernheim, NP  zinc gluconate 50 MG tablet Take 50 mg by mouth daily.   Yes [provider]  clotrimazole-betamethasone (LOTRISONE) cream Apply 1 application topically 2 (two) times daily. Patient not taking: Reported on 05/12/2021 07/15/20   Garnet Sierras, NP      Allergies    Latex, Ace inhibitors, Augmentin [amoxicillin-pot clavulanate], Ciprofloxacin, Levaquin [levofloxacin in d5w], Zocor [simvastatin], Acrylic polymer [carbomer], Chocolate, and Gabapentin    Review of Systems   Review of Systems  Respiratory:  Positive for shortness of breath.   Neurological:  Positive for dizziness.  All other systems reviewed and are negative.  Physical Exam Updated Vital Signs BP Marland Kitchen)  182/77 (BP Location: Right Arm)    Pulse 75    Temp 98.2 F (36.8 C) (Axillary)    Resp 18    Ht 5\' 4"  (1.626 m)    Wt 66.2 kg    SpO2 98%    BMI 25.06 kg/m  Physical Exam Vitals and nursing note reviewed.  Constitutional:      Comments: Chronically ill, tachypneic   HENT:     Head: Normocephalic.     Mouth/Throat:     Mouth: Mucous membranes are moist.  Eyes:     Extraocular Movements: Extraocular movements intact.     Pupils: Pupils are equal, round, and reactive to light.  Cardiovascular:     Rate and Rhythm:  Normal rate and regular rhythm.  Pulmonary:     Comments: Tachypneic, crackles bilateral bases Abdominal:     General: Bowel sounds are normal.     Palpations: Abdomen is soft.  Musculoskeletal:     Cervical back: Normal range of motion and neck supple.     Comments: 1+ edema bilateral legs  Skin:    General: Skin is warm.     Capillary Refill: Capillary refill takes less than 2 seconds.  Neurological:     General: No focal deficit present.     Mental Status: She is oriented to person, place, and time.  Psychiatric:        Mood and Affect: Mood normal.        Behavior: Behavior normal.    ED Results / Procedures / Treatments   Labs (all labs ordered are listed, but only abnormal results are displayed) Labs Reviewed  COMPREHENSIVE METABOLIC PANEL - Abnormal; Notable for the following components:      Result Value   BUN 27 (*)    All other components within normal limits  PROTIME-INR - Abnormal; Notable for the following components:   Prothrombin Time 21.0 (*)    INR 1.8 (*)    All other components within normal limits  BRAIN NATRIURETIC PEPTIDE - Abnormal; Notable for the following components:   B Natriuretic Peptide 167.7 (*)    All other components within normal limits  RESP PANEL BY RT-PCR (FLU A&B, COVID) ARPGX2  CBC WITH DIFFERENTIAL/PLATELET  MAGNESIUM  BASIC METABOLIC PANEL  CBC  PROTIME-INR  TROPONIN I (HIGH SENSITIVITY)  TROPONIN I (HIGH SENSITIVITY)    EKG EKG Interpretation  Date/Time:  Tuesday May 12 2021 13:04:47 EST Ventricular Rate:  65 PR Interval:    QRS Duration: 162 QT Interval:  492 QTC Calculation: 512 R Axis:   -82 Text Interpretation: VENTRICULAR PACED RHYTHM IVCD, consider atypical RBBB LVH with IVCD, LAD and secondary repol abnrm Inferior infarct, acute Lateral leads are also involved Prolonged QT interval Confirmed by Wandra Arthurs [phone]) on 05/12/2021 6:20:56 PM  Radiology DG Chest 2 View  Result Date: 05/12/2021 CLINICAL DATA:   sob EXAM: CHEST - 2 VIEW COMPARISON:  April 05, 2020 FINDINGS: Similar chronically hyperinflated lungs with coarse interstitial markings. No consolidation. No visible pleural effusions or pneumothorax. Cardiomediastinal silhouette is similar. Left subclavian approach pacemaker. IMPRESSION: Similar chronically hyperinflated lungs with coarse interstitial markings. No evidence of acute cardiopulmonary disease. Electronically Signed   By: Margaretha Sheffield M.D.   On: 05/12/2021 14:29   CT Angio Chest PE W and/or Wo Contrast  Result Date: 05/12/2021 CLINICAL DATA:  Pulmonary embolism (PE) suspected, high prob. Dizziness EXAM: CT ANGIOGRAPHY CHEST WITH CONTRAST TECHNIQUE: Multidetector CT imaging of the chest was performed using the standard protocol  during bolus administration of intravenous contrast. Multiplanar CT image reconstructions and MIPs were obtained to evaluate the vascular anatomy. RADIATION DOSE REDUCTION: This exam was performed according to the departmental dose-optimization program which includes automated exposure control, adjustment of the mA and/or kV according to patient size and/or use of iterative reconstruction technique. CONTRAST:  87mL OMNIPAQUE IOHEXOL 350 MG/ML SOLN COMPARISON:  CT chest 07/15/2005 FINDINGS: Cardiovascular: Left chest wall cardiac pacemaker with leads in grossly appropriate position satisfactory opacification of the pulmonary arteries to the segmental level. No evidence of pulmonary embolism. Limited evaluation of the subsegmental level due to respiratory motion artifact. Mildly enlarged heart size. No significant pericardial effusion. The thoracic aorta is normal in caliber. Mild atherosclerotic plaque of the thoracic aorta. At least 3 vessel coronary artery calcifications. Mediastinum/Nodes: Redemonstration of pericentimeter mediastinal lymph nodes. No enlarged mediastinal, hilar, or axillary lymph nodes. Thyroid gland, trachea, and esophagus demonstrate no significant  findings. Lungs/Pleura: Limited evaluation due to respiratory motion artifact biapical pleural/pulmonary scarring. No focal consolidation. Subpleural pulmonary micronodule. No pulmonary mass. No pleural effusion. No pneumothorax. Upper Abdomen: Status post cholecystectomy.  No acute abnormality. Musculoskeletal: No chest wall abnormality. No suspicious lytic or blastic osseous lesions. No acute displaced fracture. Multilevel degenerative changes of the spine. Review of the MIP images confirms the above findings. IMPRESSION: 1. No central or segment pulmonary embolus. Limited evaluation of the subsegmental level due to respiratory motion artifact. 2. No acute intrathoracic abnormality. Electronically Signed   By: Iven Finn M.D.   On: 05/12/2021 19:38   CUP PACEART REMOTE DEVICE CHECK  Result Date: 05/12/2021 Scheduled remote reviewed. Normal device function.  2 VHR's 7 & 9 beats runs, no new noise noted this remote Next remote 91 days. LA   Procedures Procedures    Medications Ordered in ED Medications  furosemide (LASIX) injection 20 mg (has no administration in time range)  sodium chloride flush (NS) 0.9 % injection 3 mL (has no administration in time range)  acetaminophen (TYLENOL) tablet 650 mg (has no administration in time range)    Or  acetaminophen (TYLENOL) suppository 650 mg (has no administration in time range)  ondansetron (ZOFRAN) tablet 4 mg (has no administration in time range)    Or  ondansetron (ZOFRAN) injection 4 mg (has no administration in time range)  senna-docusate (Senokot-S) tablet 1 tablet (has no administration in time range)  anastrozole (ARIMIDEX) tablet 1 mg (has no administration in time range)  diltiazem (CARDIZEM CD) 24 hr capsule 240 mg (has no administration in time range)  potassium chloride SA (KLOR-CON M) CR tablet 20 mEq (has no administration in time range)  irbesartan (AVAPRO) tablet 300 mg (has no administration in time range)  hydrOXYzine  (ATARAX) tablet 50 mg (has no administration in time range)  warfarin (COUMADIN) tablet 5 mg (has no administration in time range)  Warfarin - Pharmacist Dosing Inpatient (has no administration in time range)  hydrALAZINE (APRESOLINE) tablet 50 mg (has no administration in time range)  furosemide (LASIX) injection 20 mg (20 mg Intravenous Given 05/12/21 1858)  iohexol (OMNIPAQUE) 350 MG/ML injection 80 mL (80 mLs Intravenous Contrast Given 05/12/21 1915)    ED Course/ Medical Decision Making/ A&P                           Medical Decision Making Destina Mantei Stirewalt is a 88 y.o. female here presenting with shortness of breath.  Patient has shortness of breath for the last week.  Appears slightly volume overloaded. Patient has pacemaker but has a paced rhythm currently.  Concern for possible CHF exacerbation versus COVID versus PE.  Plan to get CBC and CMP and BNP and chest x-ray and COVID test  8:45 PM BNP slightly elevated.  Patient's blood pressure is elevated at 180.  We will give her home dose of Micardis.  Patient does not desat when she ambulates but she gets very short of breath.  Patient's INR is slightly low at 1.8 so CTA performed and did not show any obvious PE.  Offered admission for mild CHF and hypertensive urgency versus discharge home.  Patient states that she has difficulty taking care of herself at home since she has trouble walking to the bathroom in time.  At this point, will admit for CHF exacerbation   Amount and/or Complexity of Data Reviewed Independent Historian: caregiver External Data Reviewed: labs. Labs: ordered. Decision-making details documented in ED Course. Radiology: ordered and independent interpretation performed. ECG/medicine tests: ordered and independent interpretation performed.  Risk Decision regarding hospitalization.  Final Clinical Impression(s) / ED Diagnoses Final diagnoses:  None    Rx / DC Orders ED Discharge Orders     None          Drenda Freeze, MD 05/12/21 2334

## 2021-05-13 ENCOUNTER — Observation Stay (HOSPITAL_COMMUNITY): Payer: Medicare Other

## 2021-05-13 ENCOUNTER — Encounter (HOSPITAL_COMMUNITY): Payer: Self-pay | Admitting: Internal Medicine

## 2021-05-13 DIAGNOSIS — I5031 Acute diastolic (congestive) heart failure: Secondary | ICD-10-CM

## 2021-05-13 DIAGNOSIS — Z79811 Long term (current) use of aromatase inhibitors: Secondary | ICD-10-CM | POA: Diagnosis not present

## 2021-05-13 DIAGNOSIS — G629 Polyneuropathy, unspecified: Secondary | ICD-10-CM | POA: Diagnosis present

## 2021-05-13 DIAGNOSIS — Z881 Allergy status to other antibiotic agents status: Secondary | ICD-10-CM | POA: Diagnosis not present

## 2021-05-13 DIAGNOSIS — J984 Other disorders of lung: Secondary | ICD-10-CM | POA: Diagnosis present

## 2021-05-13 DIAGNOSIS — E785 Hyperlipidemia, unspecified: Secondary | ICD-10-CM | POA: Diagnosis present

## 2021-05-13 DIAGNOSIS — I5033 Acute on chronic diastolic (congestive) heart failure: Secondary | ICD-10-CM | POA: Diagnosis present

## 2021-05-13 DIAGNOSIS — Z8249 Family history of ischemic heart disease and other diseases of the circulatory system: Secondary | ICD-10-CM | POA: Diagnosis not present

## 2021-05-13 DIAGNOSIS — Z7901 Long term (current) use of anticoagulants: Secondary | ICD-10-CM | POA: Diagnosis not present

## 2021-05-13 DIAGNOSIS — Z888 Allergy status to other drugs, medicaments and biological substances status: Secondary | ICD-10-CM | POA: Diagnosis not present

## 2021-05-13 DIAGNOSIS — I495 Sick sinus syndrome: Secondary | ICD-10-CM | POA: Diagnosis present

## 2021-05-13 DIAGNOSIS — Z880 Allergy status to penicillin: Secondary | ICD-10-CM | POA: Diagnosis not present

## 2021-05-13 DIAGNOSIS — R5381 Other malaise: Secondary | ICD-10-CM | POA: Diagnosis present

## 2021-05-13 DIAGNOSIS — Z20822 Contact with and (suspected) exposure to covid-19: Secondary | ICD-10-CM | POA: Diagnosis present

## 2021-05-13 DIAGNOSIS — I110 Hypertensive heart disease with heart failure: Secondary | ICD-10-CM | POA: Diagnosis present

## 2021-05-13 DIAGNOSIS — Z79899 Other long term (current) drug therapy: Secondary | ICD-10-CM | POA: Diagnosis not present

## 2021-05-13 DIAGNOSIS — Z85828 Personal history of other malignant neoplasm of skin: Secondary | ICD-10-CM | POA: Diagnosis not present

## 2021-05-13 DIAGNOSIS — Z91018 Allergy to other foods: Secondary | ICD-10-CM | POA: Diagnosis not present

## 2021-05-13 DIAGNOSIS — Z8673 Personal history of transient ischemic attack (TIA), and cerebral infarction without residual deficits: Secondary | ICD-10-CM | POA: Diagnosis not present

## 2021-05-13 DIAGNOSIS — Z950 Presence of cardiac pacemaker: Secondary | ICD-10-CM | POA: Diagnosis not present

## 2021-05-13 DIAGNOSIS — I4821 Permanent atrial fibrillation: Secondary | ICD-10-CM | POA: Diagnosis present

## 2021-05-13 DIAGNOSIS — Z66 Do not resuscitate: Secondary | ICD-10-CM | POA: Diagnosis present

## 2021-05-13 LAB — ECHOCARDIOGRAM COMPLETE
AR max vel: 1.9 cm2
AV Area VTI: 2.1 cm2
AV Area mean vel: 2.03 cm2
AV Mean grad: 5 mmHg
AV Peak grad: 10.2 mmHg
Ao pk vel: 1.6 m/s
Area-P 1/2: 3.53 cm2
Height: 64 in
P 1/2 time: 503 msec
S' Lateral: 2.9 cm
Weight: 2335.11 oz

## 2021-05-13 LAB — CBC
HCT: 42.3 % (ref 36.0–46.0)
Hemoglobin: 13.7 g/dL (ref 12.0–15.0)
MCH: 30.6 pg (ref 26.0–34.0)
MCHC: 32.4 g/dL (ref 30.0–36.0)
MCV: 94.6 fL (ref 80.0–100.0)
Platelets: 233 10*3/uL (ref 150–400)
RBC: 4.47 MIL/uL (ref 3.87–5.11)
RDW: 12.6 % (ref 11.5–15.5)
WBC: 9.3 10*3/uL (ref 4.0–10.5)
nRBC: 0 % (ref 0.0–0.2)

## 2021-05-13 LAB — BASIC METABOLIC PANEL
Anion gap: 12 (ref 5–15)
BUN: 25 mg/dL — ABNORMAL HIGH (ref 8–23)
CO2: 26 mmol/L (ref 22–32)
Calcium: 9.5 mg/dL (ref 8.9–10.3)
Chloride: 102 mmol/L (ref 98–111)
Creatinine, Ser: 0.66 mg/dL (ref 0.44–1.00)
GFR, Estimated: >60 mL/min (ref >60)
Glucose, Bld: 99 mg/dL (ref 70–99)
Potassium: 3.9 mmol/L (ref 3.5–5.1)
Sodium: 140 mmol/L (ref 135–145)

## 2021-05-13 LAB — PROTIME-INR
INR: 1.6 — ABNORMAL HIGH (ref 0.8–1.2)
Prothrombin Time: 19.4 seconds — ABNORMAL HIGH (ref 11.4–15.2)

## 2021-05-13 LAB — MAGNESIUM: Magnesium: 2.1 mg/dL (ref 1.7–2.4)

## 2021-05-13 MED ORDER — PREGABALIN 25 MG PO CAPS
25.0000 mg | ORAL_CAPSULE | Freq: Two times a day (BID) | ORAL | Status: DC
  Administered 2021-05-13 – 2021-05-14 (×3): 25 mg via ORAL
  Filled 2021-05-13 (×3): qty 1

## 2021-05-13 MED ORDER — WARFARIN SODIUM 5 MG PO TABS
5.0000 mg | ORAL_TABLET | Freq: Once | ORAL | Status: AC
  Administered 2021-05-13: 5 mg via ORAL
  Filled 2021-05-13: qty 1

## 2021-05-13 NOTE — Progress Notes (Signed)
Echocardiogram 2D Echocardiogram has been performed.  Arlyss Gandy 05/13/2021, 10:59 AM

## 2021-05-13 NOTE — Evaluation (Signed)
Physical Therapy Evaluation Patient Details Name: Misty Blackwell MRN: [id number] DOB: 07-Aug-1927 Today's Date: 05/13/2021  History of Present Illness  88 yo female admitted with  CHF, DOE. Hx of peripheral neuropathy, pacemaker, Afib, CHF, CVA, TIA  Clinical Impression  On eval, pt was Supv for mobility. She walked ~200 feet with a rollator and then to/from bathroom without a device. Pt tolerated activity well. She has pretty bad peripheral neuropathy-prefers shoes. Pt's niece (who helps patient as needed) feels pt will benefit from having a rollator and she is hoping to convince her to use it for ambulation safety. Pt stated she was okay with me recommending a walker for her. Will plan to follow and progress activity as tolerated.        Recommendations for follow up therapy are one component of a multi-disciplinary discharge planning process, led by the attending physician.  Recommendations may be updated based on patient status, additional functional criteria and insurance authorization.  Follow Up Recommendations No PT follow up    Assistance Recommended at Discharge Intermittent Supervision/Assistance  Patient can return home with the following  Assistance with cooking/housework    Equipment Recommendations Rollator (4 wheels)  Recommendations for Other Services       Functional Status Assessment Patient has had a recent decline in their functional status and demonstrates the ability to make significant improvements in function in a reasonable and predictable amount of time.     Precautions / Restrictions Precautions Precautions: Fall Precaution Comments: incontinent-needs to wear pad Restrictions Weight Bearing Restrictions: No      Mobility  Bed Mobility Overal bed mobility: Modified Independent                  Transfers Overall transfer level: Modified independent                      Ambulation/Gait Ambulation/Gait assistance:  Supervision Gait Distance (Feet): 200 Feet Assistive device: Rollator (4 wheels) Gait Pattern/deviations: Step-through pattern;Decreased stride length       General Gait Details: Pt tolerated distance well- dyspnea 2/4. NO LOB with RW. She also walked  to/from bathroom without a device.  Stairs            Wheelchair Mobility    Modified Rankin (Stroke Patients Only)       Balance Overall balance assessment: Mild deficits observed, not formally tested                                           Pertinent Vitals/Pain Pain Assessment: Faces Faces Pain Scale: Hurts even more Pain Location: LEs 2* neuropathy (feet are very sensitive!) Pain Intervention(s): Monitored during session    Home Living Family/patient expects to be discharged to:: Private residence Living Arrangements: Alone Available Help at Discharge: Family;Neighbor;Available PRN/intermittently Type of Home: House         Home Layout: One level Home Equipment: Cane - single point      Prior Function Prior Level of Function : Independent/Modified Independent                     Hand Dominance        Extremity/Trunk Assessment   Upper Extremity Assessment Upper Extremity Assessment: Defer to OT evaluation    Lower Extremity Assessment Lower Extremity Assessment: Generalized weakness    Cervical / Trunk Assessment Cervical / Trunk  Assessment: Normal  Communication   Communication: No difficulties  Cognition Arousal/Alertness: Awake/alert Behavior During Therapy: WFL for tasks assessed/performed Overall Cognitive Status: Within Functional Limits for tasks assessed                                          General Comments      Exercises     Assessment/Plan    PT Assessment Patient needs continued PT services  PT Problem List Decreased mobility;Decreased activity tolerance       PT Treatment Interventions DME instruction;Gait  training;Therapeutic activities;Therapeutic exercise;Patient/family education;Balance training;Functional mobility training    PT Goals (Current goals can be found in the Care Plan section)  Acute Rehab PT Goals Patient Stated Goal: home soon PT Goal Formulation: With patient/family Time For Goal Achievement: 05/27/21 Potential to Achieve Goals: Good    Frequency Min 3X/week     Co-evaluation               AM-PAC PT "6 Clicks" Mobility  Outcome Measure Help needed turning from your back to your side while in a flat bed without using bedrails?: None Help needed moving from lying on your back to sitting on the side of a flat bed without using bedrails?: None Help needed moving to and from a bed to a chair (including a wheelchair)?: None Help needed standing up from a chair using your arms (e.g., wheelchair or bedside chair)?: None Help needed to walk in hospital room?: A Little Help needed climbing 3-5 steps with a railing? : A Little 6 Click Score: 22    End of Session Equipment Utilized During Treatment: Gait belt Activity Tolerance: Patient tolerated treatment well Patient left: in bed;with family/visitor present   PT Visit Diagnosis: Unsteadiness on feet (R26.81)    Time: 1440-1505 PT Time Calculation (min) (ACUTE ONLY): 25 min   Charges:   PT Evaluation $PT Eval Moderate Complexity: 1 Mod PT Treatments $Gait Training: 8-22 mins           Doreatha Massed, PT Acute Rehabilitation  Office: [phone] Pager: [phone]

## 2021-05-13 NOTE — Progress Notes (Signed)
PROGRESS NOTE  Misty Blackwell ZOX:[id number] DOB: 1927-08-03 DOA: 05/12/2021 PCP: Misty Pinto, MD  Brief History   Misty Blackwell is a 88 y.o. female with medical history significant for permanent atrial fibrillation on Coumadin, chronic diastolic CHF (last EF 81-19% by TTE 04/2018), SSS s/p PPM, hypertension who presented to the ED for evaluation of shortness of breath.  Patient states that she has been having some progressive shortness of breath for about 3 weeks now.  She says her breathing issues have worsened over the last 3 days.  She has been having orthopnea, having to sleep upright in a recliner these last 3 nights when she usually sleeps in bed laying on her side.  She is waking up short of breath while sleeping.  She reports associated palpitations.  She denies any chest pain.  She reports chronic nonproductive cough related to postnasal drip which is unchanged from baseline.  She reports good urine output without dysuria.  She denies any nausea, vomiting, abdominal pain.   Patient states that her blood pressure has been very elevated at home.  She says she normally has SBP in the 120s.   ED Course:  Initial vitals showed BP 172/71, pulse 69, RR 18, temp 98.3 F, SPO2 100% on room air.   Labs show sodium 140, potassium 3.9, bicarb 29, BUN 27, creatinine 0.57, serum glucose 87, LFTs within normal limits, WBC 8.2, hemoglobin 13.0, platelets 228,000, troponin 10 > 12, BNP 167.7, INR 1.8.   SARS-CoV-2 and influenza PCR negative.   2 view chest x-ray shows chronically hyperinflated lungs with coarse interstitial markings, no focal consolidation, edema, effusion.   CTA chest PE study negative for evidence of central or segment pulmonary embolus.  No acute intrathoracic abnormality.   Patient was given IV Lasix 20 mg once.  The hospitalist service was consulted to admit for further evaluation and management.  Will continue diuresis. Blood pressure continues to be  increased.   Consultants  None  Procedures  None  Antibiotics   Anti-infectives (From admission, onward)    None      Subjective  The patient is resting comfortably. No new complaints. She states that her breathing is better. She is complaining of severe neuropathic pain in her feet.  Objective   Vitals:  Vitals:   05/13/21 0647 05/13/21 1131  BP: (!) 179/79 (!) 163/60  Pulse: 62 61  Resp:  18  Temp: 98 F (36.7 C) 97.7 F (36.5 C)  SpO2: 98% 96%    Exam:  Constitutional:  The patient is awake, alert, and oriented x 3. No acute distress. Respiratory:  No increased work of breathing. No wheezes, rales, or rhonchi No tactile fremitus Cardiovascular:  Regular rate and rhythm No murmurs, ectopy, or gallups. No lateral PMI. No thrills. Abdomen:  Abdomen is soft, non-tender, non-distended No hernias, masses, or organomegaly Normoactive bowel sounds.  Musculoskeletal:  No cyanosis, clubbing, or edema Skin:  No rashes, lesions, ulcers palpation of skin: no induration or nodules Neurologic:  CN 2-12 intact Sensation all 4 extremities intact Psychiatric:  Mental status Mood, affect appropriate Orientation to person, place, time  judgment and insight appear intact   I have personally reviewed the following:   Today's Data  Vitals  Lab Data  BMP CBC  Micro Data    Imaging  CTA chest  Cardiology Data  EKG Echocardiogram: Grade II diasolic dysfunction. LVEF 60-65%.  Scheduled Meds:  anastrozole  1 mg Oral Daily   diltiazem  240 mg Oral  Daily   furosemide  20 mg Intravenous BID   irbesartan  300 mg Oral Daily   potassium chloride SA  20 mEq Oral Daily   pregabalin  25 mg Oral BID   sodium chloride flush  3 mL Intravenous Q12H   warfarin  5 mg Oral ONCE-1600   Warfarin - Pharmacist Dosing Inpatient   Does not apply q1600   Continuous Infusions:  Principal Problem:   Acute on chronic diastolic CHF (congestive heart failure) (HCC) Active  Problems:   Essential hypertension   Chronic atrial fibrillation (HCC)   LOS: 0 days   A & P  Acute on chronic diastolic CHF: Patient presenting with DOE, orthopnea, PND, trace peripheral edema consistent with acute on chronic diastolic CHF exacerbation.  She is saturating well on room air.  Troponin is negative.  Symptoms improving with initial IV Lasix however she does not feel back to baseline yet. -Continue IV Lasix 20 mg twice daily -Monitor strict I/O's and daily weights -Supplemental oxygen if needed   Permanent atrial fibrillation: In paced rhythm on admission.  INR 1.8.  Continue Coumadin per pharmacy.  Continue diltiazem 240 mg daily.   Accelerated hypertension: BP significantly elevated compared to baseline.  Likely contributing to acute CHF.  Resume home diltiazem and telmisartan.  Hydralazine if needed.   SSS s/p PPM: Remains in paced rhythm on admission.  Neuropathy:  Patient states that she can't tolerate gabapentin. She has not tried lyrica. Will try that.  Debility:  PT/OT eval and treat.  I have seen and examined this patient myself. I have spent 34 minutes in her evaluation and care.   DVT prophylaxis: Coumadin per pharmacy Code Status: DNR, confirmed with patient on admission Family Communication: Discussed with patient, she has discussed with family Disposition Plan: From home and likely discharge to home pending clinical progress.    Misty Mcclintock, DO [facility] Direct contact: see [email or web address]  7PM-7AM contact night coverage as above 05/13/2021, 3:48 PM  LOS: 0 days

## 2021-05-13 NOTE — Progress Notes (Signed)
ANTICOAGULATION CONSULT NOTE - Follow Up Consult  Pharmacy Consult for warfain Indication: hx atrial fibrillation and stroke  Allergies  Allergen Reactions   Latex Itching   Ace Inhibitors Other (See Comments)    Unknown reaction   Augmentin [Amoxicillin-Pot Clavulanate] Other (See Comments)   Ciprofloxacin Other (See Comments)   Levaquin [Levofloxacin In D5w] Other (See Comments)   Zocor [Simvastatin] Other (See Comments)   Acrylic Polymer [Carbomer] Itching   Chocolate Other (See Comments)    migraine's    Gabapentin Other (See Comments)    Unsteady gait     Patient Measurements: Height: 5\' 4"  (162.6 cm) Weight: 66.2 kg (145 lb 15.1 oz) IBW/kg (Calculated) : 54.7 Heparin Dosing Weight:   Vital Signs: Temp: 98 F (36.7 C) (01/11 0647) Temp Source: Oral (01/11 0647) BP: 179/79 (01/11 0647) Pulse Rate: 62 (01/11 0647)  Labs: Recent Labs    05/12/21 1504 05/12/21 1855 05/13/21 0421  HGB 13.0  --  13.7  HCT 40.7  --  42.3  PLT 228  --  233  LABPROT  --  21.0* 19.4*  INR  --  1.8* 1.6*  CREATININE 0.57  --  0.66  TROPONINIHS 10 12  --     Estimated Creatinine Clearance: 42.9 mL/min (by C-G formula based on SCr of 0.66 mg/dL).   Medications:  PTA warfarin regimen: 4.5mg  daily except 3 mg on TTSun  Assessment: Patient is a 88 y.o F with hx afib and CVA  on warfarin PTA, presented to the ED on 05/12/21 with c/o SOB.  Chest CTA was negative for PE.  Warfarin resumed on admission.  Today, 05/13/2021: - INR is subtherapeutic at 1.6 - cbc stable - no bleeding documented - signif. drug-drug intxns: none  Goal of Therapy:  INR 2-3 Monitor platelets by anticoagulation protocol: Yes   Plan:  - warfarin 5 mg PO x1 today - daily INR - monitor for s/sx bleeding  Bader Stubblefield P 05/13/2021,7:19 AM

## 2021-05-14 LAB — PROTIME-INR
INR: 1.7 — ABNORMAL HIGH (ref 0.8–1.2)
Prothrombin Time: 20 seconds — ABNORMAL HIGH (ref 11.4–15.2)

## 2021-05-14 MED ORDER — IRBESARTAN 300 MG PO TABS: 300.0000 mg | ORAL_TABLET | Freq: Every day | ORAL | 0 refills | Status: DC

## 2021-05-14 MED ORDER — WARFARIN SODIUM 5 MG PO TABS: 5.0000 mg | ORAL_TABLET | Freq: Once | ORAL | Status: DC

## 2021-05-14 MED ORDER — POTASSIUM CHLORIDE CRYS ER 20 MEQ PO TBCR: 20.0000 meq | EXTENDED_RELEASE_TABLET | Freq: Two times a day (BID) | ORAL | 0 refills | Status: DC

## 2021-05-14 MED ORDER — ROLLATOR ULTRA-LIGHT MISC: 1.0000 | Freq: Once | 0 refills | Status: AC

## 2021-05-14 MED ORDER — FUROSEMIDE 20 MG PO TABS: 20.0000 mg | ORAL_TABLET | Freq: Two times a day (BID) | ORAL | 11 refills | Status: DC

## 2021-05-14 MED ORDER — PREGABALIN 25 MG PO CAPS: 25.0000 mg | ORAL_CAPSULE | Freq: Two times a day (BID) | ORAL | 0 refills | Status: DC

## 2021-05-14 NOTE — Evaluation (Signed)
Occupational Therapy Evaluation Patient Details Name: Misty Blackwell MRN: [id number] DOB: 1927/11/21 Today's Date: 05/14/2021   History of Present Illness 88 yo female admitted with  CHF, DOE. Hx of peripheral neuropathy, pacemaker, Afib, CHF, CVA, TIA   Clinical Impression   Misty Blackwell is an 88 year old woman admitted to hospital with above medical history. On evaluation she exhibits good upper body strength and demonstrates ability to perform toileting and lower body dressing. She is impaired balance and reports peripheral neuropathy in feet. She had one LOB with therapist without a device. PT is recommending a walker. Patient is alert and oriented and reports she had some mild confusion at admission that has cleared up. Therapist recommended patient have niece double check her medication management but patient reports she is completely back to normal. From an OT standpoint patient has no needs.      Recommendations for follow up therapy are one component of a multi-disciplinary discharge planning process, led by the attending physician.  Recommendations may be updated based on patient status, additional functional criteria and insurance authorization.   Follow Up Recommendations  No OT follow up    Assistance Recommended at Discharge PRN  Patient can return home with the following      Functional Status Assessment  Patient has not had a recent decline in their functional status  Equipment Recommendations  None recommended by OT    Recommendations for Other Services       Precautions / Restrictions Precautions Precautions: Fall Precaution Comments: incontinent-needs to wear pad Restrictions Weight Bearing Restrictions: No      Mobility Bed Mobility Overal bed mobility: Modified Independent                  Transfers Overall transfer level: Modified independent                 General transfer comment: No device but had one LOB.       Balance Overall balance assessment: Mild deficits observed, not formally tested                                         ADL either performed or assessed with clinical judgement   ADL Overall ADL's : Independent                                       General ADL Comments: Demonstrated ability to perform toileting and LB dressing - otherwise exhibits all physical abilities to perform all ADLs.     Vision Patient Visual Report: No change from baseline       Perception     Praxis      Pertinent Vitals/Pain Pain Assessment: No/denies pain     Hand Dominance Right   Extremity/Trunk Assessment Upper Extremity Assessment Upper Extremity Assessment: Overall WFL for tasks assessed   Lower Extremity Assessment Lower Extremity Assessment: Defer to PT evaluation   Cervical / Trunk Assessment Cervical / Trunk Assessment: Normal   Communication Communication Communication: No difficulties   Cognition Arousal/Alertness: Awake/alert Behavior During Therapy: WFL for tasks assessed/performed Overall Cognitive Status: Within Functional Limits for tasks assessed  General Comments       Exercises     Shoulder Instructions      Home Living Family/patient expects to be discharged to:: Private residence Living Arrangements: Alone Available Help at Discharge: Family;Neighbor;Available PRN/intermittently Type of Home: House       Home Layout: One level     Bathroom Shower/Tub: Occupational psychologist: Handicapped height     Home Equipment: [place] - single point;Shower seat;Grab bars - tub/shower          Prior Functioning/Environment Prior Level of Function : Independent/Modified Independent                        OT Problem List:        OT Treatment/Interventions:      OT Goals(Current goals can be found in the care plan section) Acute Rehab OT Goals OT  Goal Formulation: All assessment and education complete, DC therapy  OT Frequency:      Co-evaluation              AM-PAC OT "6 Clicks" Daily Activity     Outcome Measure Help from another person eating meals?: None Help from another person taking care of personal grooming?: None Help from another person toileting, which includes using toliet, bedpan, or urinal?: None Help from another person bathing (including washing, rinsing, drying)?: None Help from another person to put on and taking off regular upper body clothing?: None Help from another person to put on and taking off regular lower body clothing?: None 6 Click Score: 24   End of Session Nurse Communication: Mobility status  Activity Tolerance: Patient tolerated treatment well Patient left: in chair;with chair alarm set  OT Visit Diagnosis: Muscle weakness (generalized) (M62.81)                Time: 2595-6387 OT Time Calculation (min): 10 min Charges:  OT General Charges $OT Visit: 1 Visit OT Evaluation $OT Eval Low Complexity: 1 Low  Krue Peterka, OTR/L [place]  Office [phone] Pager: [phone]   Lenward Chancellor 05/14/2021, 9:31 AM

## 2021-05-14 NOTE — Progress Notes (Signed)
ANTICOAGULATION CONSULT NOTE - Follow Up Consult  Pharmacy Consult for warfain Indication: hx atrial fibrillation and stroke  Allergies  Allergen Reactions   Latex Itching   Ace Inhibitors Other (See Comments)    Unknown reaction   Augmentin [Amoxicillin-Pot Clavulanate] Other (See Comments)   Ciprofloxacin Other (See Comments)   Levaquin [Levofloxacin In D5w] Other (See Comments)   Zocor [Simvastatin] Other (See Comments)   Acrylic Polymer [Carbomer] Itching   Chocolate Other (See Comments)    migraine's    Gabapentin Other (See Comments)    Unsteady gait     Patient Measurements: Height: 5\' 4"  (162.6 cm) Weight: 65.3 kg (143 lb 14.4 oz) IBW/kg (Calculated) : 54.7 Heparin Dosing Weight:   Vital Signs: Temp: 97.9 F (36.6 C) (01/12 0505) Temp Source: Oral (01/12 0505) BP: 150/70 (01/12 0505) Pulse Rate: 62 (01/12 0505)  Labs: Recent Labs    05/12/21 1504 05/12/21 1855 05/13/21 0421 05/14/21 0416  HGB 13.0  --  13.7  --   HCT 40.7  --  42.3  --   PLT 228  --  233  --   LABPROT  --  21.0* 19.4* 20.0*  INR  --  1.8* 1.6* 1.7*  CREATININE 0.57  --  0.66  --   TROPONINIHS 10 12  --   --      Estimated Creatinine Clearance: 39.6 mL/min (by C-G formula based on SCr of 0.66 mg/dL).   Medications:  PTA warfarin regimen: 4.5mg  daily except 3 mg on TTSun  Assessment: Patient is a 88 y.o F with hx afib and CVA  on warfarin PTA, presented to the ED on 05/12/21 with c/o SOB.  Chest CTA was negative for PE.  Warfarin resumed on admission.  Today, 05/14/2021: - INR is subtherapeutic at 1.7 - cbc stable - no bleeding documented - signif. drug-drug intxns: none  Goal of Therapy:  INR 2-3 Monitor platelets by anticoagulation protocol: Yes   Plan:  - repeat warfarin 5 mg PO x1 today - daily INR - monitor for s/sx bleeding  Dia Sitter P 05/14/2021,9:01 AM

## 2021-05-14 NOTE — TOC Transition Note (Signed)
Transition of Care [facility]) - CM/SW Discharge Note   Patient Details  Name: Misty Blackwell MRN: [id number] Date of Birth: 02/17/1928  [facility] CM/SW Contact:  Dessa Phi, RN Phone Number: 05/14/2021, 1:12 PM   Clinical Narrative:  No PT/OT f/u needs. Home rollator ordered-[employer] to deliver to rm prior d/c. No further CM needs.     Final next level of care: Home/Self Care Barriers to Discharge: No Barriers Identified   Patient Goals and CMS Choice        Discharge Placement                       Discharge Plan and Services   Discharge Planning Services: CM Consult            DME Arranged: Walker rolling with seat DME Agency: [employer]                  Social Determinants of Health (SDOH) Interventions     Readmission Risk Interventions No flowsheet data found.

## 2021-05-14 NOTE — Discharge Summary (Signed)
Physician Discharge Summary  Atlanta Pelto AVW:[id number] DOB: 08-Dec-1927 DOA: 05/12/2021  PCP: [name], MD  Admit date: 05/12/2021 Discharge date: 05/14/2021  Recommendations for Outpatient Follow-up:  Discharge to home with home health PT/OT Follow up with PCP in 7-10 days. Have chemistry drawn on that date. Results to be reported to PCP. Weigh yourself daily at the same time and place. Call PCP for weight gain of 3 lbs in one day or 5 lbs in a week. Use rollator for ambulation.   Follow-up Information     [facility] Follow up.   Why: rollator Contact information: [place] [address] [phone]                  Discharge Diagnoses: Principal diagnosis is #1 Acute on chronic diastolic CHF Permanent atrial fibrillation Accelerated hypertension Sick sinus syndrome s/p pacemaker Neuropathy Debility  Discharge Condition: Fair  Disposition: Home  Diet recommendation: Heart healthy  Filed Weights   05/12/21 2257 05/13/21 0305 05/14/21 0500  Weight: 66.2 kg 66.2 kg 65.3 kg    History of present illness:  Misty Blackwell is a 88 y.o. female with medical history significant for permanent atrial fibrillation on Coumadin, chronic diastolic CHF (last EF 21-30% by TTE 04/2018), SSS s/p PPM, hypertension who presented to the ED for evaluation of shortness of breath.  Patient states that she has been having some progressive shortness of breath for about 3 weeks now.  She says her breathing issues have worsened over the last 3 days.  She has been having orthopnea, having to sleep upright in a recliner these last 3 nights when she usually sleeps in bed laying on her side.  She is waking up short of breath while sleeping.  She reports associated palpitations.  She denies any chest pain.  She reports chronic nonproductive cough related to postnasal drip which is unchanged from baseline.  She reports good urine output without  dysuria.  She denies any nausea, vomiting, abdominal pain.   Patient states that her blood pressure has been very elevated at home.  She says she normally has SBP in the 120s.   ED Course:  Initial vitals showed BP 172/71, pulse 69, RR 18, temp 98.3 F, SPO2 100% on room air.   Labs show sodium 140, potassium 3.9, bicarb 29, BUN 27, creatinine 0.57, serum glucose 87, LFTs within normal limits, WBC 8.2, hemoglobin 13.0, platelets 228,000, troponin 10 > 12, BNP 167.7, INR 1.8.   SARS-CoV-2 and influenza PCR negative.   2 view chest x-ray shows chronically hyperinflated lungs with coarse interstitial markings, no focal consolidation, edema, effusion.   CTA chest PE study negative for evidence of central or segment pulmonary embolus.  No acute intrathoracic abnormality.   Patient was given IV Lasix 20 mg once.  The hospitalist service was consulted to admit for further evaluation and management.  Hospital Course:  The patient was diuresed. Her respiratory status returned to baseline. She was evaluated by PT/OT.  She is feeling better and is appropriate for discharge to home.  Today's assessment: S: The patient is resting quietly. No new complaints.  O: Vitals:  Vitals:   05/14/21 0115 05/14/21 0505  BP:  (!) 150/70  Pulse:  62  Resp: 20 18  Temp:  97.9 F (36.6 C)  SpO2:  95%    Exam:  Constitutional:  The patient is awake, alert, and oriented x 3. No acute distress. Respiratory:  No increased work of breathing. No  wheezes, rales, or rhonchi No tactile fremitus Cardiovascular:  Regular rate and rhythm No murmurs, ectopy, or gallups. No lateral PMI. No thrills. Abdomen:  Abdomen is soft, non-tender, non-distended No hernias, masses, or organomegaly Normoactive bowel sounds.  Musculoskeletal:  No cyanosis, clubbing, or edema Skin:  No rashes, lesions, ulcers palpation of skin: no induration or nodules Neurologic:  CN 2-12 intact Sensation all 4 extremities  intact Psychiatric:  Mental status Mood, affect appropriate Orientation to person, place, time  judgment and insight appear intact  Discharge Instructions  Discharge Instructions     (HEART FAILURE PATIENTS) Call MD:  Anytime you have any of the following symptoms: 1) 3 pound weight gain in 24 hours or 5 pounds in 1 week 2) shortness of breath, with or without a dry hacking cough 3) swelling in the hands, feet or stomach 4) if you have to sleep on extra pillows at night in order to breathe.   Complete by: As directed    Activity as tolerated - No restrictions   Complete by: As directed    Call MD for:  difficulty breathing, headache or visual disturbances   Complete by: As directed    Call MD for:  extreme fatigue   Complete by: As directed    Diet - low sodium heart healthy   Complete by: As directed    Discharge instructions   Complete by: As directed    Discharge to home with home health PT/OT Follow up with PCP in 7-10 days. Have chemistry drawn on that date. Results to be reported to PCP. Weigh yourself daily at the same time and place. Call PCP for weight gain of 3 lbs in one day or 5 lbs in a week. Use rollator for ambulation.   Increase activity slowly   Complete by: As directed       Allergies as of 05/14/2021       Reactions   Latex Itching   Ace Inhibitors Other (See Comments)   Unknown reaction   Augmentin [amoxicillin-pot Clavulanate] Other (See Comments)   Ciprofloxacin Other (See Comments)   Levaquin [levofloxacin In D5w] Other (See Comments)   Zocor [simvastatin] Other (See Comments)   Acrylic Polymer [carbomer] Itching   Chocolate Other (See Comments)   migraine's    Gabapentin Other (See Comments)   Unsteady gait         Medication List     STOP taking these medications    telmisartan 80 MG tablet Commonly known as: MICARDIS Replaced by: irbesartan 300 MG tablet       TAKE these medications    acidophilus Caps capsule Take 1 capsule  by mouth daily.   anastrozole 1 MG tablet Commonly known as: ARIMIDEX Take 1 tablet (1 mg total) by mouth daily.   clotrimazole-betamethasone cream Commonly known as: LOTRISONE Apply 1 application topically 2 (two) times daily.   diltiazem 240 MG 24 hr capsule Commonly known as: CARDIZEM CD Take 1 capsule (240 mg total) by mouth daily.   furosemide 20 MG tablet Commonly known as: LASIX Take 1 tablet (20 mg total) by mouth 2 (two) times daily. What changed: when to take this   GNP Iron 200 (65 Fe) MG Tabs Generic drug: Ferrous Sulfate Dried Take 1 tablet by mouth daily.   hydrOXYzine 50 MG tablet Commonly known as: ATARAX TAKE 1 TO 2 TABLETS BY MOUTH EVERY NIGHT 1 HOUR BEFORE BEDTIME AS NEEDED FOR SLEEP What changed: See the new instructions.   irbesartan 300 MG tablet  Commonly known as: AVAPRO Take 1 tablet (300 mg total) by mouth daily. Start taking on: May 15, 2021 Replaces: telmisartan 80 MG tablet   Magnesium 250 MG Tabs Take 250 mg by mouth daily.   potassium chloride SA 20 MEQ tablet Commonly known as: KLOR-CON M Take 1 tablet (20 mEq total) by mouth 2 (two) times daily. Take  1 tablet  Daily  for Potassium What changed:  how much to take how to take this when to take this   pregabalin 25 MG capsule Commonly known as: LYRICA Take 1 capsule (25 mg total) by mouth 2 (two) times daily.   Rollator Ultra-Light Misc 1 Device by Does not apply route once for 1 dose.   VITAMIN B 12 PO Take 1,000 mcg by mouth daily.   vitamin C 1000 MG tablet Take 1,000 mg by mouth daily.   Vitamin D3 50 MCG (2000 UT) capsule Take 2,000 Units by mouth daily.   warfarin 3 MG tablet Commonly known as: COUMADIN Take as directed. If you are unsure how to take this medication, talk to your nurse or doctor. Original instructions: TAKE 1 TO 1 1/2 TABLET BY MOUTH AS DIRECTED What changed:  how much to take how to take this when to take this additional instructions    zinc gluconate 50 MG tablet Take 50 mg by mouth daily.               Durable Medical Equipment  (From admission, onward)           Start     Ordered   05/14/21 1312  For home use only DME 4 wheeled rolling walker with seat  Once       Question:  Patient needs a walker to treat with the following condition  Answer:  Unsteady gait   05/14/21 1311           Allergies  Allergen Reactions   Latex Itching   Ace Inhibitors Other (See Comments)    Unknown reaction   Augmentin [Amoxicillin-Pot Clavulanate] Other (See Comments)   Ciprofloxacin Other (See Comments)   Levaquin [Levofloxacin In D5w] Other (See Comments)   Zocor [Simvastatin] Other (See Comments)   Acrylic Polymer [Carbomer] Itching   Chocolate Other (See Comments)    migraine's    Gabapentin Other (See Comments)    Unsteady gait     The results of significant diagnostics from this hospitalization (including imaging, microbiology, ancillary and laboratory) are listed below for reference.    Significant Diagnostic Studies: DG Chest 2 View  Result Date: 05/12/2021 CLINICAL DATA:  sob EXAM: CHEST - 2 VIEW COMPARISON:  April 05, 2020 FINDINGS: Similar chronically hyperinflated lungs with coarse interstitial markings. No consolidation. No visible pleural effusions or pneumothorax. Cardiomediastinal silhouette is similar. Left subclavian approach pacemaker. IMPRESSION: Similar chronically hyperinflated lungs with coarse interstitial markings. No evidence of acute cardiopulmonary disease. Electronically Signed   By: Margaretha Sheffield M.D.   On: 05/12/2021 14:29   CT Angio Chest PE W and/or Wo Contrast  Result Date: 05/12/2021 CLINICAL DATA:  Pulmonary embolism (PE) suspected, high prob. Dizziness EXAM: CT ANGIOGRAPHY CHEST WITH CONTRAST TECHNIQUE: Multidetector CT imaging of the chest was performed using the standard protocol during bolus administration of intravenous contrast. Multiplanar CT image reconstructions  and MIPs were obtained to evaluate the vascular anatomy. RADIATION DOSE REDUCTION: This exam was performed according to the departmental dose-optimization program which includes automated exposure control, adjustment of the mA and/or kV according to patient size  and/or use of iterative reconstruction technique. CONTRAST:  75mL OMNIPAQUE IOHEXOL 350 MG/ML SOLN COMPARISON:  CT chest 07/15/2005 FINDINGS: Cardiovascular: Left chest wall cardiac pacemaker with leads in grossly appropriate position satisfactory opacification of the pulmonary arteries to the segmental level. No evidence of pulmonary embolism. Limited evaluation of the subsegmental level due to respiratory motion artifact. Mildly enlarged heart size. No significant pericardial effusion. The thoracic aorta is normal in caliber. Mild atherosclerotic plaque of the thoracic aorta. At least 3 vessel coronary artery calcifications. Mediastinum/Nodes: Redemonstration of pericentimeter mediastinal lymph nodes. No enlarged mediastinal, hilar, or axillary lymph nodes. Thyroid gland, trachea, and esophagus demonstrate no significant findings. Lungs/Pleura: Limited evaluation due to respiratory motion artifact biapical pleural/pulmonary scarring. No focal consolidation. Subpleural pulmonary micronodule. No pulmonary mass. No pleural effusion. No pneumothorax. Upper Abdomen: Status post cholecystectomy.  No acute abnormality. Musculoskeletal: No chest wall abnormality. No suspicious lytic or blastic osseous lesions. No acute displaced fracture. Multilevel degenerative changes of the spine. Review of the MIP images confirms the above findings. IMPRESSION: 1. No central or segment pulmonary embolus. Limited evaluation of the subsegmental level due to respiratory motion artifact. 2. No acute intrathoracic abnormality. Electronically Signed   By: Iven Finn M.D.   On: 05/12/2021 19:38   ECHOCARDIOGRAM COMPLETE  Result Date: 05/13/2021    ECHOCARDIOGRAM REPORT    Patient Name:   Misty Blackwell [facility] Date of Exam: 05/13/2021 Medical Rec #:  [id number]            Height:       64.0 in Accession #:    [id number]           Weight:       145.9 lb Date of Birth:  02-21-1928            BSA:          99.711 m Patient Age:    50 years             BP:           179/79 mmHg Patient Gender: F                    HR:           62 bpm. Exam Location:  Inpatient Procedure: 2D Echo Indications:    CHF - acute diastolic  History:        Patient has prior history of Echocardiogram examinations, most                 recent 04/18/2018. CHF, CAD, Arrythmias:Atrial Fibrillation,                 Signs/Symptoms:Dyspnea; Risk Factors:Hypertension.  Sonographer:    Arlyss Gandy Referring Phys: [id number] [place]  1. Left ventricular ejection fraction, by estimation, is 60 to 65%. The left ventricle has normal function. The left ventricle has no regional wall motion abnormalities. There is mild concentric left ventricular hypertrophy. Left ventricular diastolic parameters are consistent with Grade II diastolic dysfunction (pseudonormalization).  2. Right ventricular systolic function is mildly reduced. The right ventricular size is normal. There is mildly elevated pulmonary artery systolic pressure.  3. Left atrial size was mildly dilated.  4. Right atrial size was moderately dilated.  5. The mitral valve is normal in structure. Mild mitral valve regurgitation. No evidence of mitral stenosis.  6. The aortic valve is calcified. Aortic valve regurgitation is mild. Aortic valve sclerosis/calcification is present, without any evidence of aortic stenosis. Aortic  regurgitation PHT measures 503 msec. Aortic valve area, by VTI measures 2.10 cm. Aortic valve mean gradient measures 5.0 mmHg. Aortic valve Vmax measures 1.60 m/s.  7. The inferior vena cava is normal in size with greater than 50% respiratory variability, suggesting right atrial pressure of 3 mmHg. FINDINGS  Left Ventricle: Left  ventricular ejection fraction, by estimation, is 60 to 65%. The left ventricle has normal function. The left ventricle has no regional wall motion abnormalities. The left ventricular internal cavity size was normal in size. There is  mild concentric left ventricular hypertrophy. Left ventricular diastolic parameters are consistent with Grade II diastolic dysfunction (pseudonormalization). Normal left ventricular filling pressure. Right Ventricle: The right ventricular size is normal. No increase in right ventricular wall thickness. Right ventricular systolic function is mildly reduced. There is mildly elevated pulmonary artery systolic pressure. The tricuspid regurgitant velocity  is 2.97 m/s, and with an assumed right atrial pressure of 8 mmHg, the estimated right ventricular systolic pressure is 42.5 mmHg. Left Atrium: Left atrial size was mildly dilated. Right Atrium: Right atrial size was moderately dilated. Pericardium: There is no evidence of pericardial effusion. Mitral Valve: The mitral valve is normal in structure. Mild mitral valve regurgitation. No evidence of mitral valve stenosis. Tricuspid Valve: The tricuspid valve is normal in structure. Tricuspid valve regurgitation is mild . No evidence of tricuspid stenosis. Aortic Valve: The aortic valve is calcified. Aortic valve regurgitation is mild. Aortic regurgitation PHT measures 503 msec. Aortic valve sclerosis/calcification is present, without any evidence of aortic stenosis. Aortic valve mean gradient measures 5.0  mmHg. Aortic valve peak gradient measures 10.2 mmHg. Aortic valve area, by VTI measures 2.10 cm. Pulmonic Valve: The pulmonic valve was normal in structure. Pulmonic valve regurgitation is trivial. No evidence of pulmonic stenosis. Aorta: The aortic root is normal in size and structure. Venous: The inferior vena cava is normal in size with greater than 50% respiratory variability, suggesting right atrial pressure of 3 mmHg. IAS/Shunts: No  atrial level shunt detected by color flow Doppler. Additional Comments: A device lead is visualized.  LEFT VENTRICLE PLAX 2D LVIDd:         4.40 cm   Diastology LVIDs:         2.90 cm   LV e' medial:    9.14 cm/s LV PW:         1.10 cm   LV E/e' medial:  9.1 LV IVS:        1.20 cm   LV e' lateral:   8.49 cm/s LVOT diam:     2.00 cm   LV E/e' lateral: 9.8 LV SV:         68 LV SV Index:   39 LVOT Area:     3.14 cm  RIGHT VENTRICLE            IVC RV Basal diam:  3.00 cm    IVC diam: 2.10 cm RV S prime:     8.05 cm/s TAPSE (M-mode): 1.6 cm LEFT ATRIUM             Index        RIGHT ATRIUM           Index LA diam:        3.90 cm 2.28 cm/m   RA Area:     21.50 cm LA Vol (A2C):   68.4 ml 39.97 ml/m  RA Volume:   65.00 ml  37.99 ml/m LA Vol (A4C):   56.7 ml 33.14 ml/m  LA Biplane Vol: 61.5 ml 35.94 ml/m  AORTIC VALVE AV Area (Vmax):    1.90 cm AV Area (Vmean):   2.03 cm AV Area (VTI):     2.10 cm AV Vmax:           160.00 cm/s AV Vmean:          100.000 cm/s AV VTI:            0.321 m AV Peak Grad:      10.2 mmHg AV Mean Grad:      5.0 mmHg LVOT Vmax:         96.60 cm/s LVOT Vmean:        64.500 cm/s LVOT VTI:          0.215 m LVOT/AV VTI ratio: 0.67 AI PHT:            503 msec  AORTA Ao Root diam: 3.50 cm Ao Asc diam:  3.60 cm MITRAL VALVE               TRICUSPID VALVE MV Area (PHT): 3.53 cm    TR Peak grad:   35.3 mmHg MV Decel Time: 215 msec    TR Vmax:        297.00 cm/s MV E velocity: 83.60 cm/s MV A velocity: 35.10 cm/s  SHUNTS MV E/A ratio:  2.38        Systemic VTI:  0.22 m                            Systemic Diam: 2.00 cm Fransico Him MD Electronically signed by Fransico Him MD Signature Date/Time: 05/13/2021/11:12:54 AM    Final    CUP PACEART REMOTE DEVICE CHECK  Result Date: 05/12/2021 Scheduled remote reviewed. Normal device function.  2 VHR's 7 & 9 beats runs, no new noise noted this remote Next remote 91 days. [place]   Microbiology: Recent Results (from the past 240 hour(s))  Resp Panel by RT-PCR  (Flu A&B, Covid) Nasopharyngeal Swab     Status: None   Collection Time: 05/12/21  6:57 PM   Specimen: Nasopharyngeal Swab; Nasopharyngeal(NP) swabs in vial transport medium  Result Value Ref Range Status   SARS Coronavirus 2 by RT PCR NEGATIVE NEGATIVE Final    Comment: (NOTE) SARS-CoV-2 target nucleic acids are NOT DETECTED.  The SARS-CoV-2 RNA is generally detectable in upper respiratory specimens during the acute phase of infection. The lowest concentration of SARS-CoV-2 viral copies this assay can detect is 138 copies/mL. A negative result does not preclude SARS-Cov-2 infection and should not be used as the sole basis for treatment or other patient management decisions. A negative result may occur with  improper specimen collection/handling, submission of specimen other than nasopharyngeal swab, presence of viral mutation(s) within the areas targeted by this assay, and inadequate number of viral copies(<138 copies/mL). A negative result must be combined with clinical observations, patient history, and epidemiological information. The expected result is Negative.  Fact Sheet for Patients:  EntrepreneurPulse.com.au  Fact Sheet for Healthcare Providers:  IncredibleEmployment.be  This test is no t yet approved or cleared by the [place] FDA and  has been authorized for detection and/or diagnosis of SARS-CoV-2 by FDA under an Emergency Use Authorization (EUA). This EUA will remain  in effect (meaning this test can be used) for the duration of the COVID-19 declaration under Section 564(b)(1) of the Act, 21 U.S.C.section 360bbb-3(b)(1), unless the authorization is terminated  or revoked  sooner.       Influenza A by PCR NEGATIVE NEGATIVE Final   Influenza B by PCR NEGATIVE NEGATIVE Final    Comment: (NOTE) The Xpert Xpress SARS-CoV-2/FLU/RSV plus assay is intended as an aid in the diagnosis of influenza from Nasopharyngeal swab specimens  and should not be used as a sole basis for treatment. Nasal washings and aspirates are unacceptable for Xpert Xpress SARS-CoV-2/FLU/RSV testing.  Fact Sheet for Patients: EntrepreneurPulse.com.au  Fact Sheet for Healthcare Providers: IncredibleEmployment.be  This test is not yet approved or cleared by the [place] FDA and has been authorized for detection and/or diagnosis of SARS-CoV-2 by FDA under an Emergency Use Authorization (EUA). This EUA will remain in effect (meaning this test can be used) for the duration of the COVID-19 declaration under Section 564(b)(1) of the Act, 21 U.S.C. section 360bbb-3(b)(1), unless the authorization is terminated or revoked.  Performed at [facility], [place] [address] [place], [place] [address]      Labs: Basic Metabolic Panel: Recent Labs  Lab 05/12/21 1504 05/13/21 0421  NA 140 140  K 3.9 3.9  CL 103 102  CO2 29 26  GLUCOSE 87 99  BUN 27* 25*  CREATININE 0.57 0.66  CALCIUM 9.1 9.5  MG  --  2.1   Liver Function Tests: Recent Labs  Lab 05/12/21 1504  AST 15  ALT 14  ALKPHOS 110  BILITOT 0.7  PROT 7.2  ALBUMIN 4.0   No results for input(s): LIPASE, AMYLASE in the last 168 hours. No results for input(s): AMMONIA in the last 168 hours. CBC: Recent Labs  Lab 05/12/21 1504 05/13/21 0421  WBC 8.2 9.3  NEUTROABS 4.9  --   HGB 13.0 13.7  HCT 40.7 42.3  MCV 94.9 94.6  PLT 228 233   Cardiac Enzymes: No results for input(s): CKTOTAL, CKMB, CKMBINDEX, TROPONINI in the last 168 hours. BNP: BNP (last 3 results) Recent Labs    05/12/21 1855  BNP 167.7*    ProBNP (last 3 results) No results for input(s): PROBNP in the last 8760 hours.  CBG: No results for input(s): GLUCAP in the last 168 hours.  Principal Problem:   Acute on chronic diastolic CHF (congestive heart failure) (HCC) Active Problems:   Essential hypertension   Chronic atrial fibrillation  ([place])   Time coordinating discharge: 38 minutes.  Signed:        Manisha Cancel, DO [facility]  05/14/2021, 5:53 PM

## 2021-05-14 NOTE — Plan of Care (Signed)
°  Problem: Education: Goal: Ability to demonstrate management of disease process will improve 05/14/2021 1245 by Elizebeth Brooking, RN Outcome: Adequate for Discharge 05/14/2021 1245 by Elizebeth Brooking, RN Outcome: Adequate for Discharge Goal: Ability to verbalize understanding of medication therapies will improve 05/14/2021 1245 by Elizebeth Brooking, RN Outcome: Adequate for Discharge 05/14/2021 1245 by Elizebeth Brooking, RN Outcome: Adequate for Discharge Goal: Individualized Educational Video(s) 05/14/2021 1245 by Elizebeth Brooking, RN Outcome: Adequate for Discharge 05/14/2021 1245 by Elizebeth Brooking, RN Outcome: Adequate for Discharge   Problem: Activity: Goal: Capacity to carry out activities will improve 05/14/2021 1245 by Elizebeth Brooking, RN Outcome: Adequate for Discharge 05/14/2021 1245 by Elizebeth Brooking, RN Outcome: Adequate for Discharge   Problem: Cardiac: Goal: Ability to achieve and maintain adequate cardiopulmonary perfusion will improve 05/14/2021 1245 by Elizebeth Brooking, RN Outcome: Adequate for Discharge 05/14/2021 1245 by Elizebeth Brooking, RN Outcome: Adequate for Discharge

## 2021-05-18 ENCOUNTER — Encounter: Payer: Self-pay | Admitting: Cardiovascular Disease

## 2021-05-18 ENCOUNTER — Ambulatory Visit (INDEPENDENT_AMBULATORY_CARE_PROVIDER_SITE_OTHER): Payer: Medicare Other | Admitting: Cardiovascular Disease

## 2021-05-18 ENCOUNTER — Other Ambulatory Visit: Payer: Self-pay

## 2021-05-18 VITALS — BP 140/62 | HR 68 | Ht 64.5 in | Wt 145.2 lb

## 2021-05-18 DIAGNOSIS — I4821 Permanent atrial fibrillation: Secondary | ICD-10-CM

## 2021-05-18 DIAGNOSIS — Z950 Presence of cardiac pacemaker: Secondary | ICD-10-CM

## 2021-05-18 DIAGNOSIS — I10 Essential (primary) hypertension: Secondary | ICD-10-CM

## 2021-05-18 DIAGNOSIS — D6869 Other thrombophilia: Secondary | ICD-10-CM | POA: Diagnosis not present

## 2021-05-18 DIAGNOSIS — I5032 Chronic diastolic (congestive) heart failure: Secondary | ICD-10-CM

## 2021-05-18 DIAGNOSIS — I495 Sick sinus syndrome: Secondary | ICD-10-CM

## 2021-05-18 MED ORDER — FUROSEMIDE 20 MG PO TABS: 40.0000 mg | ORAL_TABLET | Freq: Every day | ORAL | 11 refills | Status: DC

## 2021-05-18 MED ORDER — DILTIAZEM HCL ER COATED BEADS 180 MG PO CP24: 180.0000 mg | ORAL_CAPSULE | Freq: Every day | ORAL | 3 refills | Status: DC

## 2021-05-18 NOTE — Progress Notes (Signed)
[name], female   DOB: [birth year], 88 y.o.   MRN: [id number]    Cardiology Office Note    Date:  05/19/2021   ID:  Misty, Blackwell October 04, 1927, MRN [id number]  PCP:  Misty Pinto, MD  Cardiologist:   Misty Klein, MD   Chief Complaint  Patient presents with   Follow-up    History of Present Illness:  Misty Blackwell is a 88 y.o. female who presents for follow-up.  A brief hospitalization for shortness of breath.  She has a history of permanent atrial fibrillation with slow ventricular response and pacemaker (Medtronic), remote TIA, HTN, minor CAD, asymptomatic pacemaker-detected NSVT, history of diastolic HF.  She presented to the emergency room on 05/12/2021 with severe shortness of breath with minimal exertion.  She did not have edema.  According to notes, she did have about 3 days of orthopnea prior to hospitalization.  Her blood pressure was high (systolic 299).  Oxygen saturation was normal, but she was visibly dyspneic with minimum activity, according to the ED physician.  Her BNP was marginally elevated at 167 (this may be normal for 88 year old; past BNP levels have been 105-239).  Her chest x-ray did not show findings of congestive heart failure and her CT angiogram of the chest did not show pulm embolism or any infiltrates.  COVID and flu test was negative.  The only antihypertensive she was given was her usual home dose of telmisartan.  She received a single dose of intravenous furosemide 20 mg after which she was admitted to the hospital where she received another dose of furosemide 20 mg IV 12 hours later.  She improved rapidly and was discharged the following day.  Her weight is not documented.  Renal function and potassium levels remained normal throughout her hospitalization.  An echocardiogram performed while she was hospitalized showed normal left ventricular systolic function and was interpreted as showing pseudo normal filling (diastolic  function cannot be evaluated in this patient in atrial fibrillation).  No major valve problems were identified.  She is feeling better.  She is no longer short of breath.  Per her report she weighed 145 pounds on her home scale before her hospitalization and weight 140 pounds on the home scale this morning.  Our office scale appears to overestimate her weight compared to her home by 5 pounds.  She still does not have any edema and no longer has orthopnea.  She did not have chest discomfort at any point to during this illness.  She also denies syncope and palpitations, although she did have some problems with dizziness.  Her blood pressure today is normal.  Pacemaker interrogation shows normal device function.  Her Medtronic Adapta dual-chamber device implanted in 2011 is programmed VVIR due to permanent atrial fibrillation and still has about 9 months of remaining longevity.  Her ventricular lead is a Medtronic [id number] which was not MRI conditional.  She has about 91% ventricular pacing and very rare episodes of high ventricular rate.  In 2016 she underwent echocardiography and nuclear stress testing.  Left ventricular systolic function was normal, she had a fixed moderate apical defect but without reversible ischemia.  Moderate tricuspid regurgitation and moderate pulmonary artery hypertension with estimated systolic PA pressure of 58 mmHg.  In April 2019 she had a nuclear stress test showing normal perfusion and an echocardiogram which showed unchanged findings of mild LVH, normal left ventricular systolic function, mild left atrial dilation, improved degree of pulmonary hypertension 41 mmHg.  Past Medical History:  Diagnosis Date   Anxiety    Atrial fib/flutter, transient    Atrial fibrillation, chronic (HCC)    CHF (congestive heart failure) ([place]) 10/29/2009   Echo - EF >55%; normal LV size and systolic function; unable to assess diastolic fcn due to E/A fusion, pulmonary vein flow pattern  suggests elevated filling pressure; marked biatrail dilation, mild/mod tricuspid regurgitation; mod pulmonary htn; mild/mod mitral regurgitation; although echocardiographic features are incomplete findings suggest possible infiltrative cardiomyopathy (maybe amyloidosi   Coronary artery disease 03/19/2002   R/P Cardiolite - EF 76%; nromal static and dynamic myocardial perfusion images; normal wall motion and endocardial thickening in all vascular territories   Dyspnea    Dysrhythmia    Facial numbness 12/26/2008   carotid doppler - R and L ICAs 0-49% diameter reduction (velocities suggest low end of scale)   Hypertension    Pacemaker    Peripheral neuropathy    Pneumonia 2017   Skin cancer    s/p surgical removal.   Stroke ([place]) 04/2017   TIA (transient ischemic attack)     Past Surgical History:  Procedure Laterality Date   ABDOMINAL HYSTERECTOMY     APPENDECTOMY     BREAST LUMPECTOMY WITH RADIOACTIVE SEED LOCALIZATION Right 12/13/2019   Procedure: RIGHT BREAST LUMPECTOMY WITH RADIOACTIVE SEED LOCALIZATION;  Surgeon: Donnie Mesa, MD;  Location: [facility];  Service: General;  Laterality: Right;  LMA VS MAC   BREAST SURGERY Left 1949   CARDIAC CATHETERIZATION  08/06/2005   minimal coronary disease predominant RCA; no significant atherosclerosis; new onset sick sinus syndrome and atrial flutter w/ ventricular response, controlled on med therapy; systemic HTN, normal renal arteries   CARDIOVERSION  11/19/2009   successful DCCV from AF to sinus type rhythm   CHOLECYSTECTOMY     EYE SURGERY Bilateral 2013   Skin cancer resection     TONSILLECTOMY  1938    Outpatient Medications Prior to Visit  Medication Sig Dispense Refill   acidophilus (RISAQUAD) CAPS capsule Take 1 capsule by mouth daily.     anastrozole (ARIMIDEX) 1 MG tablet Take 1 tablet (1 mg total) by mouth daily. 90 tablet 3   Ascorbic Acid (VITAMIN C) 1000 MG tablet Take 1,000 mg by mouth daily.      Cholecalciferol (VITAMIN D3)  50 MCG (2000 UT) capsule Take 2,000 Units by mouth daily.     clotrimazole-betamethasone (LOTRISONE) cream Apply 1 application topically 2 (two) times daily. 15 g 2   Cyanocobalamin (VITAMIN B 12 PO) Take 1,000 mcg by mouth daily.      GNP IRON 200 (65 Fe) MG TABS Take 1 tablet by mouth daily.     hydrOXYzine (ATARAX/VISTARIL) 50 MG tablet TAKE 1 TO 2 TABLETS BY MOUTH EVERY NIGHT 1 HOUR BEFORE BEDTIME AS NEEDED FOR SLEEP (Patient taking differently: Take 50 mg by mouth at bedtime.) 60 tablet 5   irbesartan (AVAPRO) 300 MG tablet Take 1 tablet (300 mg total) by mouth daily. 30 tablet 0   Magnesium 250 MG TABS Take 250 mg by mouth daily.     potassium chloride SA (KLOR-CON M) 20 MEQ tablet Take 1 tablet (20 mEq total) by mouth 2 (two) times daily. Take  1 tablet  Daily  for Potassium 60 tablet 0   pregabalin (LYRICA) 25 MG capsule Take 1 capsule (25 mg total) by mouth 2 (two) times daily. 60 capsule 0   warfarin (COUMADIN) 3 MG tablet TAKE 1 TO 1 1/2 TABLET BY MOUTH AS DIRECTED (  Patient taking differently: Take 3-4.5 mg by mouth as directed. Take 1 tablet (3 mg) on (Sun, Tues, Thurs) & Take 1.5 tablets (4.5 mg) on (Mon, Wed, Fri, Sat)) 135 tablet 1   zinc gluconate 50 MG tablet Take 50 mg by mouth daily.     diltiazem (CARDIZEM CD) 240 MG 24 hr capsule Take 1 capsule (240 mg total) by mouth daily. 30 capsule 6   furosemide (LASIX) 20 MG tablet Take 1 tablet (20 mg total) by mouth 2 (two) times daily. 30 tablet 11   No facility-administered medications prior to visit.     Allergies:   Latex, Ace inhibitors, Augmentin [amoxicillin-pot clavulanate], Ciprofloxacin, Levaquin [levofloxacin in d5w], Zocor [simvastatin], Acrylic polymer [carbomer], Chocolate, and Gabapentin   Social History   Socioeconomic History   Marital status: Widowed    Spouse name: Not on file   Number of children: 0   Years of education: Not on file   Highest education level: Not on file  Occupational History   Not on file   Tobacco Use   Smoking status: Never   Smokeless tobacco: Never  Vaping Use   Vaping Use: Never used  Substance and Sexual Activity   Alcohol use: No   Drug use: No   Sexual activity: Never    Birth control/protection: None  Other Topics Concern   Not on file  Social History Narrative   Widowed.  Lives alone.  Ambulates independently.   Social Determinants of Health   Financial Resource Strain: Not on file  Food Insecurity: Not on file  Transportation Needs: Not on file  Physical Activity: Not on file  Stress: Not on file  Social Connections: Not on file     Family History:  The patient's family history includes Cirrhosis in her brother; Diabetes in her mother; Heart attack in her father; Heart disease in her father and mother.   ROS:   Please see the history of present illness.    ROS All other systems are reviewed and are negative.   PHYSICAL EXAM:   VS:  BP 140/62 (BP Location: Left Arm, Patient Position: Sitting, Cuff Size: Normal)    Pulse 68    Ht 5' 4.5" (1.638 m)    Wt 145 lb 3.2 oz (65.9 kg)    SpO2 97%    BMI 24.54 kg/m        Wt Readings from Last 3 Encounters:  05/18/21 145 lb 3.2 oz (65.9 kg)  05/14/21 143 lb 14.4 oz (65.3 kg)  04/28/21 148 lb 12.8 oz (67.5 kg)      Studies/Labs Reviewed:   EKG:  EKG is not ordered today.  ECG from 05/12/2021 is personally reviewed and shows background atrial fibrillation with 100% ventricular paced rhythm  04/28/2021: TSH 1.20 05/12/2021: ALT 14 05/13/2021: Hemoglobin 13.7; Magnesium 2.1; Platelets 233 05/18/2021: BNP WILL FOLLOW; BUN 44; Creatinine, Ser 0.89; Potassium 4.9; Sodium 143   Lipid Panel    Component Value Date/Time   CHOL 165 04/28/2021 1532   TRIG 115 04/28/2021 1532   HDL 47 (L) 04/28/2021 1532   CHOLHDL 3.5 04/28/2021 1532   VLDL 34 (H) 09/28/2016 1125   LDLCALC 97 04/28/2021 1532     ASSESSMENT:    1. Chronic diastolic heart failure ([place])   2. Permanent atrial fibrillation ([place])   3.  Essential hypertension   4. Acquired thrombophilia ([place])   5. Pacemaker      PLAN:  In order of problems listed above:  CHF: We have  previously estimated her dry weight to be around 143 pounds or less.  It appears that she was in heart failure with a weight of 145 pounds based on symptoms and response to diuretics (otherwise there is really no support for heart failure exacerbation based on chest x-ray, CT chest, BNP level).  A little puzzling.  We will give her a weight-based prescription for furosemide to avoid these events from occurring in the future.  She has 90% ventricular pacing, but this is only slightly higher than her usual burden of 80% ventricular pacing so I do not think her heart failure is due to pacing.  Poorly tolerant of twice daily dosing of furosemide, will consolidate to furosemide 40 mg once daily.  We could consider imaging for cardiac amyloidosis, disease modifying therapy at age 54 is unlikely to produce a big change in prognosis/outcome. AFib with slow ventricular response, almost never has RVR and already has about 90% ventricular pacing.  We will reduce the dose of diltiazem to reduce the frequency of ventricular pacing.  History of  previous transient ischemic attack (CHADSVasc 8: age 89, TIA 2, HTN, CAD, CHF, gender).  HTN:   Fair control today.  We will be cutting down on her diltiazem to reduce RV pacing, continue maximum dose irbesartan and daily furosemide.  Send [place] blood pressure readings in about a week's time. Warfarin: No recent falls or bleeding complications. PPM: Normal device function.  Anticipate need for generator change out before the end of this year.  Even if she receives an MRI conditional generator, she will not have an MRI compatible system since her leads are not MRI conditional. HLP: She did not tolerate more potent statins.  Her most recent LDL cholesterol on the current regimen is 90 mg/deciliter, which is acceptable in the absence of a clear  history of coronary disease or PAD, although she does have imaging evidence of aortic atherosclerosis.    Medication Adjustments/Labs and Tests Ordered: Current medicines are reviewed at length with the patient today.  Concerns regarding medicines are outlined above.  Medication changes, Labs and Tests ordered today are listed in the Patient Instructions below. Patient Instructions  Medication Instructions:  Furosemide: Take 40 mg once daily (two of the 20 mg tablets)  Diltiazem: Decrease it to 180 mg once daily  *If you need a refill on your cardiac medications before your next appointment, please call your pharmacy*   Lab Work: Your provider would like for you to have the following labs today: BMET and BNP  If you have labs (blood work) drawn today and your tests are completely normal, you will receive your results only by: MyChart Message (if you have MyChart) OR A paper copy in the mail If you have any lab test that is abnormal or we need to change your treatment, we will call you to review the results.   Testing/Procedures: None ordered   Follow-Up: At [facility], you and your health needs are our priority.  As part of our continuing mission to provide you with exceptional heart care, we have created designated Provider Care Teams.  These Care Teams include your primary Cardiologist (physician) and Advanced Practice Providers (APPs -  Physician Assistants and Nurse Practitioners) who all work together to provide you with the care you need, when you need it.  We recommend signing up for the patient portal called "MyChart".  Sign up information is provided on this After Visit Summary.  MyChart is used to connect with patients for Virtual  Visits (Telemedicine).  Patients are able to view lab/test results, encounter notes, upcoming appointments, etc.  Non-urgent messages can be sent to your provider as well.   To learn more about what you can do with MyChart, go to  [email or web address].    Your next appointment:   3 month(s) on a pacer day  The format for your next appointment:   In Person  Provider:   Sanda Klein, MD          Signed, Misty Klein, MD  05/19/2021 11:23 AM    [address], [place], [place]  [address] Phone: [phone]; Fax: [phone]

## 2021-05-18 NOTE — Patient Instructions (Signed)
Medication Instructions:  Furosemide: Take 40 mg once daily (two of the 20 mg tablets)  Diltiazem: Decrease it to 180 mg once daily  *If you need a refill on your cardiac medications before your next appointment, please call your pharmacy*   Lab Work: Your provider would like for you to have the following labs today: BMET and BNP  If you have labs (blood work) drawn today and your tests are completely normal, you will receive your results only by: [place] (if you have MyChart) OR A paper copy in the mail If you have any lab test that is abnormal or we need to change your treatment, we will call you to review the results.   Testing/Procedures: None ordered   Follow-Up: At [facility], you and your health needs are our priority.  As part of our continuing mission to provide you with exceptional heart care, we have created designated Provider Care Teams.  These Care Teams include your primary Cardiologist (physician) and Advanced Practice Providers (APPs -  Physician Assistants and Nurse Practitioners) who all work together to provide you with the care you need, when you need it.  We recommend signing up for the patient portal called "MyChart".  Sign up information is provided on this After Visit Summary.  MyChart is used to connect with patients for Virtual Visits (Telemedicine).  Patients are able to view lab/test results, encounter notes, upcoming appointments, etc.  Non-urgent messages can be sent to your provider as well.   To learn more about what you can do with MyChart, go to [email or web address].    Your next appointment:   3 month(s) on a pacer day  The format for your next appointment:   In Person  Provider:   Sanda Klein, MD

## 2021-05-19 ENCOUNTER — Encounter: Payer: Self-pay | Admitting: Cardiovascular Disease

## 2021-05-19 LAB — BASIC METABOLIC PANEL
BUN/Creatinine Ratio: 49 — ABNORMAL HIGH (ref 12–28)
BUN: 44 mg/dL — ABNORMAL HIGH (ref 10–36)
CO2: 26 mmol/L (ref 20–29)
Calcium: 10.1 mg/dL (ref 8.7–10.3)
Chloride: 102 mmol/L (ref 96–106)
Creatinine, Ser: 0.89 mg/dL (ref 0.57–1.00)
Glucose: 93 mg/dL (ref 70–99)
Potassium: 4.9 mmol/L (ref 3.5–5.2)
Sodium: 143 mmol/L (ref 134–144)
eGFR: 61 mL/min/{1.73_m2} (ref >59)

## 2021-05-19 LAB — BRAIN NATRIURETIC PEPTIDE: BNP: 93.7 pg/mL (ref 0.0–100.0)

## 2021-05-19 NOTE — Progress Notes (Signed)
Remote pacemaker transmission.   

## 2021-05-20 ENCOUNTER — Encounter (HOSPITAL_COMMUNITY): Payer: Self-pay

## 2021-05-20 ENCOUNTER — Encounter: Payer: Self-pay | Admitting: *Deleted

## 2021-05-25 ENCOUNTER — Other Ambulatory Visit: Payer: Self-pay

## 2021-05-25 ENCOUNTER — Ambulatory Visit (INDEPENDENT_AMBULATORY_CARE_PROVIDER_SITE_OTHER): Payer: Medicare Other

## 2021-05-25 DIAGNOSIS — I482 Chronic atrial fibrillation, unspecified: Secondary | ICD-10-CM | POA: Diagnosis not present

## 2021-05-25 DIAGNOSIS — Z7901 Long term (current) use of anticoagulants: Secondary | ICD-10-CM | POA: Diagnosis not present

## 2021-05-25 LAB — POCT INR: INR: 2.6 (ref 2.0–3.0)

## 2021-05-25 NOTE — Patient Instructions (Signed)
-   Continue taking 1.5 tablets daily except 1 tablet Sunday, Tuesday and Thursday.  - Repeat INR 6 weeks.  Call if you have questions [phone] Erasmo Downer)

## 2021-05-27 ENCOUNTER — Telehealth: Payer: Self-pay

## 2021-05-27 NOTE — Telephone Encounter (Signed)
Patient has declined to come into the office for a hospital follow up

## 2021-06-01 ENCOUNTER — Other Ambulatory Visit: Payer: Self-pay

## 2021-06-01 DIAGNOSIS — Z1212 Encounter for screening for malignant neoplasm of rectum: Secondary | ICD-10-CM | POA: Diagnosis not present

## 2021-06-01 DIAGNOSIS — Z1211 Encounter for screening for malignant neoplasm of colon: Secondary | ICD-10-CM | POA: Diagnosis not present

## 2021-06-01 LAB — POC HEMOCCULT BLD/STL (HOME/3-CARD/SCREEN)
Card #2 Fecal Occult Blod, POC: NEGATIVE
Card #3 Fecal Occult Blood, POC: NEGATIVE
Fecal Occult Blood, POC: NEGATIVE

## 2021-06-08 ENCOUNTER — Other Ambulatory Visit: Payer: Self-pay | Admitting: Adult Health

## 2021-06-08 DIAGNOSIS — F5101 Primary insomnia: Secondary | ICD-10-CM

## 2021-06-15 ENCOUNTER — Other Ambulatory Visit: Payer: Self-pay

## 2021-06-15 MED ORDER — IRBESARTAN 300 MG PO TABS: 300.0000 mg | ORAL_TABLET | Freq: Every day | ORAL | 0 refills | Status: DC

## 2021-06-15 MED ORDER — PREGABALIN 25 MG PO CAPS: 25.0000 mg | ORAL_CAPSULE | Freq: Two times a day (BID) | ORAL | 0 refills | Status: DC

## 2021-06-17 ENCOUNTER — Other Ambulatory Visit: Payer: Self-pay | Admitting: Internal Medicine

## 2021-06-24 NOTE — Assessment & Plan Note (Signed)
12/13/19:Right lumpectomy ([facility]): invasive lobular carcinoma, grade 2, 2.4cm, clear margins.ER 80%, PR 50%, HER-2 negative, Ki-67 5%  Treatment plan: Anastrozole adjuvant therapy 1 mg daily x5 years Anastrozole toxicities:Hot flashes have resolved. Otherwise tolerating it extremely well.  Breast cancer surveillance: 1.Breast exam2/23/2023: Benign 2.Mammogram 12/01/2020. Benign density Cat B  She likes to travel around with her church group to hear [place] speakers. Return to clinic in1 year for follow-up

## 2021-06-24 NOTE — Progress Notes (Signed)
Patient Care Team: [name], MD as PCP - General (Internal Medicine) [name], [name], MD as PCP - Cardiology (Cardiology) [name], MD (Inactive) as Consulting Physician (Urology) Penni Bombard, MD as Consulting Physician (Neurology) [name], MD as Consulting Physician (Neurology) [name], [name], MD as Consulting Physician (Cardiology) [name], MD (Inactive) as Consulting Physician (Gastroenterology) [name], RN as Oncology Nurse Navigator [name], RN as Oncology Nurse Navigator  DIAGNOSIS:    ICD-10-CM   1. Malignant neoplasm of upper-outer quadrant of right breast in female, estrogen receptor positive ([place])  C50.411    Z17.0       SUMMARY OF ONCOLOGIC HISTORY: Oncology History  Malignant neoplasm of upper-outer quadrant of right breast in female, estrogen receptor positive ([place])  11/15/2019 Cancer Staging   Staging form: Breast, AJCC 8th Edition - Clinical stage from 11/15/2019: Stage IA (cT1c, cN0, cM0, G2, ER+, PR+, HER2-) - Signed by [name], NP on 11/21/2019    11/15/2019 Initial Biopsy   Palpable right breast mass: 1.5 cm at 12 o'clock position, benign intramammary lymph node, axilla negative, right breast biopsy 1 grade 2 ER 80%, PR 50%, Ki-67 5%, HER-2 negative   12/13/2019 Surgery   Right lumpectomy ([facility]): invasive lobular carcinoma, grade 2, 2.4cm, clear margins.      CHIEF COMPLIANT: Follow-up of right breast cancer on anastrozole   INTERVAL HISTORY: Misty Blackwell is a 88 y.o. with above-mentioned history of right breast cancer who underwent a right lumpectomy and is currently on antiestrogen therapy with anastrozole. She presents to the clinic today for follow-up.   ALLERGIES:  is allergic to latex, ace inhibitors, augmentin [amoxicillin-pot clavulanate], ciprofloxacin, levaquin [levofloxacin in d5w], zocor [simvastatin], acrylic polymer [carbomer], chocolate, and  gabapentin.  MEDICATIONS:  Current Outpatient Medications  Medication Sig Dispense Refill   acidophilus (RISAQUAD) CAPS capsule Take 1 capsule by mouth daily.     anastrozole (ARIMIDEX) 1 MG tablet Take 1 tablet (1 mg total) by mouth daily. 90 tablet 3   Ascorbic Acid (VITAMIN C) 1000 MG tablet Take 1,000 mg by mouth daily.      Cholecalciferol (VITAMIN D3) 50 MCG (2000 UT) capsule Take 2,000 Units by mouth daily.     clotrimazole-betamethasone (LOTRISONE) cream Apply 1 application topically 2 (two) times daily. 15 g 2   Cyanocobalamin (VITAMIN B 12 PO) Take 1,000 mcg by mouth daily.      diltiazem (CARDIZEM CD) 180 MG 24 hr capsule Take 1 capsule (180 mg total) by mouth daily. 90 capsule 3   furosemide (LASIX) 20 MG tablet Take 2 tablets (40 mg total) by mouth daily. 30 tablet 11   GNP IRON 200 (65 Fe) MG TABS Take 1 tablet by mouth daily.     hydrOXYzine (ATARAX) 50 MG tablet TAKE 1 TO 2 TABLETS  1 HOUR BEFORE BEDTIME AS NEEDED FOR SLEEP 60 tablet 0   irbesartan (AVAPRO) 300 MG tablet Take 1 tablet (300 mg total) by mouth daily. 30 tablet 0   Magnesium 250 MG TABS Take 250 mg by mouth daily.     potassium chloride SA (KLOR-CON M) 20 MEQ tablet Take 1 tablet (20 mEq total) by mouth 2 (two) times daily. Take  1 tablet  Daily  for Potassium 60 tablet 0   pregabalin (LYRICA) 25 MG capsule Take 1 capsule (25 mg total) by mouth 2 (two) times daily. 60 capsule 0   warfarin (COUMADIN) 3 MG tablet TAKE 1 TO 1 1/2 TABLET BY  MOUTH AS DIRECTED (Patient taking differently: Take 3-4.5 mg by mouth as directed. Take 1 tablet (3 mg) on (Sun, Tues, Thurs) & Take 1.5 tablets (4.5 mg) on (Mon, Wed, Fri, Sat)) 135 tablet 1   zinc gluconate 50 MG tablet Take 50 mg by mouth daily.     No current facility-administered medications for this visit.    PHYSICAL EXAMINATION: ECOG PERFORMANCE STATUS: 1 - Symptomatic but completely ambulatory  There were no vitals filed for this visit. There were no vitals filed for  this visit.  BREAST: No palpable masses or nodules in either right or left breasts. No palpable axillary supraclavicular or infraclavicular adenopathy no breast tenderness or nipple discharge. (exam performed in the presence of a chaperone)  LABORATORY DATA:  I have reviewed the data as listed CMP Latest Ref Rng & Units 05/18/2021 05/13/2021 05/12/2021  Glucose 70 - 99 mg/dL 93 99 87  BUN 10 - 36 mg/dL 44(H) 25(H) 27(H)  Creatinine 0.57 - 1.00 mg/dL 0.89 0.66 0.57  Sodium 134 - 144 mmol/L 143 140 140  Potassium 3.5 - 5.2 mmol/L 4.9 3.9 3.9  Chloride 96 - 106 mmol/L 102 102 103  CO2 20 - 29 mmol/L _0 Calcium 8.7 - 10.3 mg/dL 10.1 9.5 9.1  Total Protein 6.5 - 8.1 g/dL - - 7.2  Total Bilirubin 0.3 - 1.2 mg/dL - - 0.7  Alkaline Phos 38 - 126 U/L - - 110  AST 15 - 41 U/L - - 15  ALT 0 - 44 U/L - - 14    Lab Results  Component Value Date   WBC 9.3 05/13/2021   HGB 13.7 05/13/2021   HCT 42.3 05/13/2021   MCV 94.6 05/13/2021   PLT 233 05/13/2021   NEUTROABS 4.9 05/12/2021    ASSESSMENT & PLAN:  Malignant neoplasm of upper-outer quadrant of right breast in female, estrogen receptor positive ([place]) 12/13/19: Right lumpectomy ([facility]): invasive lobular carcinoma, grade 2, 2.4cm, clear margins.  ER 80%, PR 50%, HER-2 negative, Ki-67 5%   Treatment plan: Anastrozole adjuvant therapy 1 mg daily x5 years started September 2021 Anastrozole toxicities: Hot flashes have resolved. Otherwise tolerating it extremely well.    Breast cancer surveillance: 1.  Breast exam 06/25/2021: Benign 2. Mammogram 12/01/2020. Benign density Cat B   She likes to travel around with her church group to hear [place] speakers. Return to clinic in 1 year for follow-up  No orders of the defined types were placed in this encounter.  The patient has a good understanding of the overall plan. she agrees with it. she will call with any problems that may develop before the next visit here.  Total time spent: 20  mins including face to face time and time spent for planning, charting and coordination of care  Rulon Eisenmenger, MD, MPH 06/25/2021  I, Thana Ates, am acting as scribe for Dr. [name].  I have reviewed the above documentation for accuracy and completeness, and I agree with the above.

## 2021-06-25 ENCOUNTER — Inpatient Hospital Stay: Payer: Medicare Other | Attending: Hematology and Oncology | Admitting: Hematology and Oncology

## 2021-06-25 ENCOUNTER — Other Ambulatory Visit: Payer: Self-pay

## 2021-06-25 DIAGNOSIS — Z79899 Other long term (current) drug therapy: Secondary | ICD-10-CM | POA: Diagnosis not present

## 2021-06-25 DIAGNOSIS — C50411 Malignant neoplasm of upper-outer quadrant of right female breast: Secondary | ICD-10-CM | POA: Diagnosis not present

## 2021-06-25 DIAGNOSIS — Z170 Estrogen receptor positive status [ER+]: Secondary | ICD-10-CM | POA: Diagnosis not present

## 2021-06-25 DIAGNOSIS — Z79811 Long term (current) use of aromatase inhibitors: Secondary | ICD-10-CM | POA: Diagnosis not present

## 2021-07-02 ENCOUNTER — Other Ambulatory Visit: Payer: Self-pay | Admitting: Hematology and Oncology

## 2021-07-02 ENCOUNTER — Other Ambulatory Visit: Payer: Self-pay | Admitting: Internal Medicine

## 2021-07-02 DIAGNOSIS — F5101 Primary insomnia: Secondary | ICD-10-CM

## 2021-07-06 ENCOUNTER — Ambulatory Visit (INDEPENDENT_AMBULATORY_CARE_PROVIDER_SITE_OTHER): Payer: Medicare Other

## 2021-07-06 ENCOUNTER — Other Ambulatory Visit: Payer: Self-pay

## 2021-07-06 DIAGNOSIS — I482 Chronic atrial fibrillation, unspecified: Secondary | ICD-10-CM | POA: Diagnosis not present

## 2021-07-06 DIAGNOSIS — Z7901 Long term (current) use of anticoagulants: Secondary | ICD-10-CM | POA: Diagnosis not present

## 2021-07-06 LAB — POCT INR: INR: 2.2 (ref 2.0–3.0)

## 2021-07-06 NOTE — Patient Instructions (Signed)
-   Continue taking 1.5 tablets daily except 1 tablet Sunday, Tuesday and Thursday.  ?- Repeat INR 5 weeks.  ?Call if you have questions [phone] Erasmo Downer) ?

## 2021-07-13 ENCOUNTER — Other Ambulatory Visit: Payer: Self-pay | Admitting: Internal Medicine

## 2021-07-15 ENCOUNTER — Encounter: Payer: Medicare Other | Admitting: Adult Health

## 2021-08-03 ENCOUNTER — Other Ambulatory Visit: Payer: Self-pay | Admitting: Internal Medicine

## 2021-08-03 ENCOUNTER — Other Ambulatory Visit: Payer: Self-pay | Admitting: Nurse Practitioner

## 2021-08-03 DIAGNOSIS — E876 Hypokalemia: Secondary | ICD-10-CM

## 2021-08-03 DIAGNOSIS — F5101 Primary insomnia: Secondary | ICD-10-CM

## 2021-08-03 MED ORDER — POTASSIUM CHLORIDE CRYS ER 20 MEQ PO TBCR: EXTENDED_RELEASE_TABLET | ORAL | 1 refills | Status: DC

## 2021-08-07 ENCOUNTER — Ambulatory Visit (INDEPENDENT_AMBULATORY_CARE_PROVIDER_SITE_OTHER): Payer: Medicare Other

## 2021-08-07 DIAGNOSIS — I495 Sick sinus syndrome: Secondary | ICD-10-CM

## 2021-08-08 LAB — CUP PACEART REMOTE DEVICE CHECK
Battery Impedance: 9156 Ohm
Battery Voltage: 2.6 V
Brady Statistic RV Percent Paced: 92 %
Date Time Interrogation Session: 20230407105846
Implantable Lead Implant Date: 20110428
Implantable Lead Implant Date: 20110428
Implantable Lead Location: 753859
Implantable Lead Location: 753860
Implantable Lead Model: 4092
Implantable Lead Model: 4592
Implantable Pulse Generator Implant Date: 20110428
Lead Channel Impedance Value: 67 Ohm
Lead Channel Impedance Value: 733 Ohm
Lead Channel Setting Pacing Amplitude: 2.5 V
Lead Channel Setting Pacing Pulse Width: 0.4 ms
Lead Channel Setting Sensing Sensitivity: 4 mV

## 2021-08-10 ENCOUNTER — Ambulatory Visit (INDEPENDENT_AMBULATORY_CARE_PROVIDER_SITE_OTHER): Payer: Medicare Other

## 2021-08-10 ENCOUNTER — Ambulatory Visit (INDEPENDENT_AMBULATORY_CARE_PROVIDER_SITE_OTHER): Payer: Medicare Other | Admitting: Cardiovascular Disease

## 2021-08-10 ENCOUNTER — Encounter: Payer: Self-pay | Admitting: Cardiovascular Disease

## 2021-08-10 ENCOUNTER — Telehealth: Payer: Self-pay

## 2021-08-10 VITALS — BP 154/62 | HR 55 | Ht 64.5 in | Wt 146.8 lb

## 2021-08-10 DIAGNOSIS — Z7901 Long term (current) use of anticoagulants: Secondary | ICD-10-CM

## 2021-08-10 DIAGNOSIS — I482 Chronic atrial fibrillation, unspecified: Secondary | ICD-10-CM | POA: Diagnosis not present

## 2021-08-10 DIAGNOSIS — I4821 Permanent atrial fibrillation: Secondary | ICD-10-CM

## 2021-08-10 DIAGNOSIS — D6869 Other thrombophilia: Secondary | ICD-10-CM | POA: Diagnosis not present

## 2021-08-10 DIAGNOSIS — E782 Mixed hyperlipidemia: Secondary | ICD-10-CM

## 2021-08-10 DIAGNOSIS — Z45010 Encounter for checking and testing of cardiac pacemaker pulse generator [battery]: Secondary | ICD-10-CM

## 2021-08-10 DIAGNOSIS — I10 Essential (primary) hypertension: Secondary | ICD-10-CM | POA: Diagnosis not present

## 2021-08-10 DIAGNOSIS — I5032 Chronic diastolic (congestive) heart failure: Secondary | ICD-10-CM | POA: Diagnosis not present

## 2021-08-10 LAB — POCT INR: INR: 2.1 (ref 2.0–3.0)

## 2021-08-10 NOTE — Patient Instructions (Signed)
-   Continue taking 1.5 tablets daily except 1 tablet Sunday, Tuesday and Thursday.  ?- Repeat INR 6 weeks.  ?Call if you have questions [phone] Erasmo Downer) ?

## 2021-08-10 NOTE — Progress Notes (Signed)
[name], female   DOB: 11-25-1927, 88 y.o.   MRN: [id number] ?  ? ?Cardiology Office Note   ? ?Date:  08/10/2021  ? ?[name], DOB [birth year], MRN [id number] ? ?PCP:  [name], MD  ?Cardiologist:   Sanda Klein, MD  ? ?Chief Complaint  ?Patient presents with  ? Pacemaker Problem  ? ? ?History of Present Illness:  ?Misty Blackwell is a 88 y.o. female who presents for pacemaker generator at replacement indicator.  She has a history of permanent atrial fibrillation with slow ventricular response and pacemaker (Medtronic), remote TIA, HTN, minor CAD, asymptomatic pacemaker-detected NSVT, history of diastolic HF. ? An echocardiogram performed while she was hospitalized for heart failure exacerbation last year showed normal left ventricular systolic function and was interpreted as showing pseudo normal filling (diastolic function cannot be evaluated in this patient in atrial fibrillation).  No major valve problems were identified. ? ?Her weight today is 146 pounds, very similar to 145 pounds at her last office visit.  She has not had recent problems with dyspnea on exertion, orthopnea or PND.  She denies lower extremity edema.  Her blood pressure is borderline high.  She denies angina, palpitations, claudication or focal neurological complaints.  She does complain of some fatigue over the last couple of weeks.  This probably happened when her device lost rate response, after reaching ERI.   ? ?Pacemaker interrogation shows otherwise normal device function.  Her Medtronic Adapta dual-chamber device implanted in 2011 is programmed VVIR due to permanent atrial fibrillation. Her ventricular lead is a Medtronic [phone] which is not MRI conditional.  She has about 92% ventricular pacing and very rare episodes of high ventricular rate (since her last in person device check only one 8-second high ventricular rate was recorded on June 16, 2021.). ? ?A couple of weeks ago she had  a fall, tripping on a rug in her bathroom, when she was scared by a bug on the floor.  She has an ecchymosis in the right supraorbital area.  She did not seek medical attention.  She did not have a headache and does not have neurological complaints.  She has pain in her left hip, but did not fall on that hip and is walking fine. ? ?In 2016 she underwent echocardiography and nuclear stress testing.  Left ventricular systolic function was normal, she had a fixed moderate apical defect but without reversible ischemia.  Moderate tricuspid regurgitation and moderate pulmonary artery hypertension with estimated systolic PA pressure of 58 mmHg. ? ?In April 2019 she had a nuclear stress test showing normal perfusion and an echocardiogram which showed unchanged findings of mild LVH, normal left ventricular systolic function, mild left atrial dilation, improved degree of pulmonary hypertension 41 mmHg. ? ? ?Past Medical History:  ?Diagnosis Date  ? Anxiety   ? Atrial fib/flutter, transient   ? Atrial fibrillation, chronic ([place])   ? CHF (congestive heart failure) ([place]) 10/29/2009  ? Echo - EF >55%; normal LV size and systolic function; unable to assess diastolic fcn due to E/A fusion, pulmonary vein flow pattern suggests elevated filling pressure; marked biatrail dilation, mild/mod tricuspid regurgitation; mod pulmonary htn; mild/mod mitral regurgitation; although echocardiographic features are incomplete findings suggest possible infiltrative cardiomyopathy (maybe amyloidosi  ? Coronary artery disease 03/19/2002  ? R/P Cardiolite - EF 76%; nromal static and dynamic myocardial perfusion images; normal wall motion and endocardial thickening in all vascular territories  ? Dyspnea   ? Dysrhythmia   ?  Facial numbness 12/26/2008  ? carotid doppler - R and L ICAs 0-49% diameter reduction (velocities suggest low end of scale)  ? Hypertension   ? Pacemaker   ? Peripheral neuropathy   ? Pneumonia 2017  ? Skin cancer   ? s/p surgical  removal.  ? Stroke ([place]) 04/2017  ? TIA (transient ischemic attack)   ? ? ?Past Surgical History:  ?Procedure Laterality Date  ? ABDOMINAL HYSTERECTOMY    ? APPENDECTOMY    ? BREAST LUMPECTOMY WITH RADIOACTIVE SEED LOCALIZATION Right 12/13/2019  ? Procedure: RIGHT BREAST LUMPECTOMY WITH RADIOACTIVE SEED LOCALIZATION;  Surgeon: Donnie Mesa, MD;  Location: [facility];  Service: General;  Laterality: Right;  LMA VS MAC  ? BREAST SURGERY Left 1949  ? CARDIAC CATHETERIZATION  08/06/2005  ? minimal coronary disease predominant RCA; no significant atherosclerosis; new onset sick sinus syndrome and atrial flutter w/ ventricular response, controlled on med therapy; systemic HTN, normal renal arteries  ? CARDIOVERSION  11/19/2009  ? successful DCCV from AF to sinus type rhythm  ? CHOLECYSTECTOMY    ? EYE SURGERY Bilateral 2013  ? Skin cancer resection    ? TONSILLECTOMY  1938  ? ? ?Outpatient Medications Prior to Visit  ?Medication Sig Dispense Refill  ? acidophilus (RISAQUAD) CAPS capsule Take 1 capsule by mouth daily.    ? anastrozole (ARIMIDEX) 1 MG tablet TAKE 1 TABLET(1 MG) BY MOUTH DAILY 90 tablet 3  ? Ascorbic Acid (VITAMIN C) 1000 MG tablet Take 1,000 mg by mouth daily.     ? Cholecalciferol (VITAMIN D3) 50 MCG (2000 UT) capsule Take 2,000 Units by mouth daily.    ? clotrimazole-betamethasone (LOTRISONE) cream Apply 1 application topically 2 (two) times daily. 15 g 2  ? Cyanocobalamin (VITAMIN B 12 PO) Take 1,000 mcg by mouth daily.     ? diltiazem (CARDIZEM CD) 240 MG 24 hr capsule Take 240 mg by mouth daily.    ? furosemide (LASIX) 20 MG tablet Take 2 tablets (40 mg total) by mouth daily. 30 tablet 11  ? GNP IRON 200 (65 Fe) MG TABS Take 1 tablet by mouth daily.    ? irbesartan (AVAPRO) 300 MG tablet TAKE 1 TABLET(300 MG) BY MOUTH DAILY 30 tablet 0  ? Magnesium 250 MG TABS Take 250 mg by mouth daily.    ? potassium chloride SA (KLOR-CON M) 20 MEQ tablet Take  1 tablet  2 x /day  for Potassium 180 tablet 1  ? pregabalin  (LYRICA) 25 MG capsule Take 1 capsule (25 mg total) by mouth 2 (two) times daily. 60 capsule 0  ? warfarin (COUMADIN) 3 MG tablet TAKE 1 TO 1 1/2 TABLET BY MOUTH AS DIRECTED (Patient taking differently: Take 3-4.5 mg by mouth as directed. Take 1 tablet (3 mg) on (Sun, Tues, Thurs) & Take 1.5 tablets (4.5 mg) on (Mon, Wed, Fri, Sat)) 135 tablet 1  ? zinc gluconate 50 MG tablet Take 50 mg by mouth daily.    ? diltiazem (CARDIZEM CD) 180 MG 24 hr capsule Take 1 capsule (180 mg total) by mouth daily. (Patient not taking: Reported on 08/10/2021) 90 capsule 3  ? hydrOXYzine (ATARAX) 50 MG tablet TAKE 1 TO 2 TABLETS BY MOUTH EVERY NIGHT 1 HOUR BEFORE BEDTIME AS NEEDED FOR SLEEP (Patient not taking: Reported on 08/10/2021) 60 tablet 0  ? ?No facility-administered medications prior to visit.  ?  ? ?Allergies:   Latex, Ace inhibitors, Augmentin [amoxicillin-pot clavulanate], Ciprofloxacin, Levaquin [levofloxacin in d5w], Zocor [simvastatin], Acrylic  polymer [carbomer], Chocolate, and Gabapentin  ? ?Social History  ? ?Socioeconomic History  ? Marital status: Widowed  ?  Spouse name: Not on file  ? Number of children: 0  ? Years of education: Not on file  ? Highest education level: Not on file  ?Occupational History  ? Not on file  ?Tobacco Use  ? Smoking status: Never  ? Smokeless tobacco: Never  ?Vaping Use  ? Vaping Use: Never used  ?Substance and Sexual Activity  ? Alcohol use: No  ? Drug use: No  ? Sexual activity: Never  ?  Birth control/protection: None  ?Other Topics Concern  ? Not on file  ?Social History Narrative  ? Widowed.  Lives alone.  Ambulates independently.  ? ?Social Determinants of Health  ? ?Financial Resource Strain: Not on file  ?Food Insecurity: Not on file  ?Transportation Needs: Not on file  ?Physical Activity: Not on file  ?Stress: Not on file  ?Social Connections: Not on file  ?  ? ?Family History:  The patient's family history includes Cirrhosis in her brother; Diabetes in her mother; Heart attack in  her father; Heart disease in her father and mother.  ? ?ROS:   ?Please see the history of present illness.    ?ROS All other systems are reviewed and are negative.  ? ?PHYSICAL EXAM:   ?VS:  BP (!) 154/62

## 2021-08-10 NOTE — Patient Instructions (Signed)
Medication Instructions: ?Your physician recommends that you continue on your current medications as directed. Please refer to the Current Medication list given to you today. ? ?* If you need a refill on your cardiac medications before your next appointment, please call your pharmacy. * ? ?Labwork: ?Pre procedure lab work today: BMET & CBC ? ?* Will notify you of abnormal results, otherwise continue current treatment plan.* ? ?Testing/Procedures: ?Your physician has recommended that you have a pacemaker/defibrillator generator change (battery change). Please follow the instructions below, located under the special instructions section. ? ?Follow-Up: ?Your physician recommends that you schedule a wound check appointment 10-14 days, after your procedure on 08/24/21, with the device clinic. ? ?Your physician recommends that you schedule a follow up appointment in 91 days, after your procedure on 08/24/21, with Dr. Sallyanne Kuster. ? ?Thank you for choosing [facility]!! ? ? ? ? ? ?Any Other Special Instructions Will Be Listed Below (If Applicable). ? ? ? ?[facility] at [facility]  ?[address], [address]  ?[place], [place] [address]  ?Phone: [phone] Fax: [phone] ? ? ? Generator Change Procedure Instructions ? ?You are scheduled for a Generator Change (battery change) on  08/24/21  with Dr. Sallyanne Kuster. ? ?1. Please arrive at the [facility], Entrance "A"  at [facility] at  1:30 pm on the day of your procedure. (The address is [address] ? ?2. DIET: You may have a light, early breakfast the morning of your procedure. NOTHING TO EAT AFTER 8:00 AM. ? ?3. LABS: Completed 08/10/21 ? ?4. MEDICATIONS: Hold the Coumadin two days prior and the day of (Hold 4/22 and 4/23 and day of) ? ?5.  Plan for an overnight stay.  Bring your insurance cards and a list of you medications. ? ?6.  Wash your chest and neck with surgical scrub the evening before and the morning of your procedure.   Rinse well. Please review the surgical scrub instruction sheet given to you.  ? ?7. Your chest will need to be shaved prior to this procedure (if needed). We ask that you do this yourself at home 1 to 2 days before or if uncomfortable/unable to do yourself, then it will be performed by the hospital staff the day of. ? ?* Special note:  Every effort is made to have your procedure done on time.  Occasionally there are emergencies that present themselves at the hospital that may cause delays.  Please be patient if a delay does occur. ?                                                                                                          ?* If you have any questions after you get home, please call Lattie Haw, RN at [phone]. ? ? ? ?[facility] - Preparing For Surgery ? ?Before surgery, you can play an important role. Because skin is not sterile, your skin needs to be as free of germs as possible. You can reduce the number of germs on your skin by washing with CHG (  chlorahexidine gluconate) Soap before surgery.  CHG is an antiseptic cleaner which kills germs and bonds with the skin to continue killing germs even after washing.  ? ?Please do not use if you have an allergy to CHG or antibacterial soaps.  If your skin becomes reddened/irritated stop using the CHG.   ?Do not shave (including legs and underarms) for at least 48 hours prior to first CHG shower.  It is OK to shave your face. ? ?Please follow these instructions carefully: ? 1.  Shower the night before surgery and the morning of surgery with CHG. ? 2.  If you choose to wash your hair, wash your hair first as usual with your normal shampoo. ? 3.  After you shampoo, rinse your hair and body thoroughly to remove the shampoo. ? 4.  Use CHG as you would any other liquid soap.  You can apply CHG directly to the skin and wash gently with a clean washcloth. ?5.  Apply the CHG Soap to your body ONLY FROM THE NECK DOWN.  Do not use on open wounds or open sores.  Avoid  contact with your eyes, ears, mouth and genitals (private parts).   ? 6.  Wash thoroughly, paying special attention to the area where your surgery will be performed. ? 7.  Thoroughly rinse your body with warm water from the neck down.  ? 8.  DO NOT shower/wash with your normal soap after using and rinsing off the CHG soap. ? 9.  Pat yourself dry with a clean towel. ?  10.  Wear clean pajamas. ?  11.  Place clean sheets on your bed the night of your first shower and do not sleep with pets. ? ?Day of Surgery: ?Do not apply any deodorants/lotions.  Please wear clean clothes to the hospital/surgery center. ? ? ? ?

## 2021-08-10 NOTE — H&P (View-Only) (Signed)
[name], female   DOB: 11-25-1927, 88 y.o.   MRN: [id number] ?  ? ?Cardiology Office Note   ? ?Date:  08/10/2021  ? ?[name], DOB [birth year], MRN [id number] ? ?PCP:  [name], MD  ?Cardiologist:   Sanda Klein, MD  ? ?Chief Complaint  ?Patient presents with  ? Pacemaker Problem  ? ? ?History of Present Illness:  ?Misty Blackwell is a 88 y.o. female who presents for pacemaker generator at replacement indicator.  She has a history of permanent atrial fibrillation with slow ventricular response and pacemaker (Medtronic), remote TIA, HTN, minor CAD, asymptomatic pacemaker-detected NSVT, history of diastolic HF. ? An echocardiogram performed while she was hospitalized for heart failure exacerbation last year showed normal left ventricular systolic function and was interpreted as showing pseudo normal filling (diastolic function cannot be evaluated in this patient in atrial fibrillation).  No major valve problems were identified. ? ?Her weight today is 146 pounds, very similar to 145 pounds at her last office visit.  She has not had recent problems with dyspnea on exertion, orthopnea or PND.  She denies lower extremity edema.  Her blood pressure is borderline high.  She denies angina, palpitations, claudication or focal neurological complaints.  She does complain of some fatigue over the last couple of weeks.  This probably happened when her device lost rate response, after reaching ERI.   ? ?Pacemaker interrogation shows otherwise normal device function.  Her Medtronic Adapta dual-chamber device implanted in 2011 is programmed VVIR due to permanent atrial fibrillation. Her ventricular lead is a Medtronic [phone] which is not MRI conditional.  She has about 92% ventricular pacing and very rare episodes of high ventricular rate (since her last in person device check only one 8-second high ventricular rate was recorded on June 16, 2021.). ? ?A couple of weeks ago she had  a fall, tripping on a rug in her bathroom, when she was scared by a bug on the floor.  She has an ecchymosis in the right supraorbital area.  She did not seek medical attention.  She did not have a headache and does not have neurological complaints.  She has pain in her left hip, but did not fall on that hip and is walking fine. ? ?In 2016 she underwent echocardiography and nuclear stress testing.  Left ventricular systolic function was normal, she had a fixed moderate apical defect but without reversible ischemia.  Moderate tricuspid regurgitation and moderate pulmonary artery hypertension with estimated systolic PA pressure of 58 mmHg. ? ?In April 2019 she had a nuclear stress test showing normal perfusion and an echocardiogram which showed unchanged findings of mild LVH, normal left ventricular systolic function, mild left atrial dilation, improved degree of pulmonary hypertension 41 mmHg. ? ? ?Past Medical History:  ?Diagnosis Date  ? Anxiety   ? Atrial fib/flutter, transient   ? Atrial fibrillation, chronic ([place])   ? CHF (congestive heart failure) ([place]) 10/29/2009  ? Echo - EF >55%; normal LV size and systolic function; unable to assess diastolic fcn due to E/A fusion, pulmonary vein flow pattern suggests elevated filling pressure; marked biatrail dilation, mild/mod tricuspid regurgitation; mod pulmonary htn; mild/mod mitral regurgitation; although echocardiographic features are incomplete findings suggest possible infiltrative cardiomyopathy (maybe amyloidosi  ? Coronary artery disease 03/19/2002  ? R/P Cardiolite - EF 76%; nromal static and dynamic myocardial perfusion images; normal wall motion and endocardial thickening in all vascular territories  ? Dyspnea   ? Dysrhythmia   ?  Facial numbness 12/26/2008  ? carotid doppler - R and L ICAs 0-49% diameter reduction (velocities suggest low end of scale)  ? Hypertension   ? Pacemaker   ? Peripheral neuropathy   ? Pneumonia 2017  ? Skin cancer   ? s/p surgical  removal.  ? Stroke ([facility]) 04/2017  ? TIA (transient ischemic attack)   ? ? ?Past Surgical History:  ?Procedure Laterality Date  ? ABDOMINAL HYSTERECTOMY    ? APPENDECTOMY    ? BREAST LUMPECTOMY WITH RADIOACTIVE SEED LOCALIZATION Right 12/13/2019  ? Procedure: RIGHT BREAST LUMPECTOMY WITH RADIOACTIVE SEED LOCALIZATION;  Surgeon: Tsuei, Matthew, MD;  Location: [facility];  Service: General;  Laterality: Right;  LMA VS MAC  ? BREAST SURGERY Left 1949  ? CARDIAC CATHETERIZATION  08/06/2005  ? minimal coronary disease predominant RCA; no significant atherosclerosis; new onset sick sinus syndrome and atrial flutter w/ ventricular response, controlled on med therapy; systemic HTN, normal renal arteries  ? CARDIOVERSION  11/19/2009  ? successful DCCV from AF to sinus type rhythm  ? CHOLECYSTECTOMY    ? EYE SURGERY Bilateral 2013  ? Skin cancer resection    ? TONSILLECTOMY  1938  ? ? ?Outpatient Medications Prior to Visit  ?Medication Sig Dispense Refill  ? acidophilus (RISAQUAD) CAPS capsule Take 1 capsule by mouth daily.    ? anastrozole (ARIMIDEX) 1 MG tablet TAKE 1 TABLET(1 MG) BY MOUTH DAILY 90 tablet 3  ? Ascorbic Acid (VITAMIN C) 1000 MG tablet Take 1,000 mg by mouth daily.     ? Cholecalciferol (VITAMIN D3) 50 MCG (2000 UT) capsule Take 2,000 Units by mouth daily.    ? clotrimazole-betamethasone (LOTRISONE) cream Apply 1 application topically 2 (two) times daily. 15 g 2  ? Cyanocobalamin (VITAMIN B 12 PO) Take 1,000 mcg by mouth daily.     ? diltiazem (CARDIZEM CD) 240 MG 24 hr capsule Take 240 mg by mouth daily.    ? furosemide (LASIX) 20 MG tablet Take 2 tablets (40 mg total) by mouth daily. 30 tablet 11  ? GNP IRON 200 (65 Fe) MG TABS Take 1 tablet by mouth daily.    ? irbesartan (AVAPRO) 300 MG tablet TAKE 1 TABLET(300 MG) BY MOUTH DAILY 30 tablet 0  ? Magnesium 250 MG TABS Take 250 mg by mouth daily.    ? potassium chloride SA (KLOR-CON M) 20 MEQ tablet Take  1 tablet  2 x /day  for Potassium 180 tablet 1  ? pregabalin  (LYRICA) 25 MG capsule Take 1 capsule (25 mg total) by mouth 2 (two) times daily. 60 capsule 0  ? warfarin (COUMADIN) 3 MG tablet TAKE 1 TO 1 1/2 TABLET BY MOUTH AS DIRECTED (Patient taking differently: Take 3-4.5 mg by mouth as directed. Take 1 tablet (3 mg) on (Sun, Tues, Thurs) & Take 1.5 tablets (4.5 mg) on (Mon, Wed, Fri, Sat)) 135 tablet 1  ? zinc gluconate 50 MG tablet Take 50 mg by mouth daily.    ? diltiazem (CARDIZEM CD) 180 MG 24 hr capsule Take 1 capsule (180 mg total) by mouth daily. (Patient not taking: Reported on 08/10/2021) 90 capsule 3  ? hydrOXYzine (ATARAX) 50 MG tablet TAKE 1 TO 2 TABLETS BY MOUTH EVERY NIGHT 1 HOUR BEFORE BEDTIME AS NEEDED FOR SLEEP (Patient not taking: Reported on 08/10/2021) 60 tablet 0  ? ?No facility-administered medications prior to visit.  ?  ? ?Allergies:   Latex, Ace inhibitors, Augmentin [amoxicillin-pot clavulanate], Ciprofloxacin, Levaquin [levofloxacin in d5w], Zocor [simvastatin], Acrylic   polymer [carbomer], Chocolate, and Gabapentin  ? ?Social History  ? ?Socioeconomic History  ? Marital status: Widowed  ?  Spouse name: Not on file  ? Number of children: 0  ? Years of education: Not on file  ? Highest education level: Not on file  ?Occupational History  ? Not on file  ?Tobacco Use  ? Smoking status: Never  ? Smokeless tobacco: Never  ?Vaping Use  ? Vaping Use: Never used  ?Substance and Sexual Activity  ? Alcohol use: No  ? Drug use: No  ? Sexual activity: Never  ?  Birth control/protection: None  ?Other Topics Concern  ? Not on file  ?Social History Narrative  ? Widowed.  Lives alone.  Ambulates independently.  ? ?Social Determinants of Health  ? ?Financial Resource Strain: Not on file  ?Food Insecurity: Not on file  ?Transportation Needs: Not on file  ?Physical Activity: Not on file  ?Stress: Not on file  ?Social Connections: Not on file  ?  ? ?Family History:  The patient's family history includes Cirrhosis in her brother; Diabetes in her mother; Heart attack in  her father; Heart disease in her father and mother.  ? ?ROS:   ?Please see the history of present illness.    ?ROS All other systems are reviewed and are negative.  ? ?PHYSICAL EXAM:   ?VS:  BP (!) 154/62

## 2021-08-10 NOTE — Telephone Encounter (Signed)
Scheduled remote reviewed. Normal device function.  Battery 2.60V. ?RRT reached 03/28.23. VVI @ 65 bpm. VP 92%. 1-HVR 8 sec. 150-200 bpm/ 20 beats on 06/16/21. ? ?Patient called and made aware PPM has reached RRT. Advised I will forward to scheduling to reach out in regard to apt. To discuss gen change. Pt voiced understanding.  ?

## 2021-08-10 NOTE — Telephone Encounter (Signed)
Patient already scheduled 08/10/21 ?

## 2021-08-11 LAB — BASIC METABOLIC PANEL
BUN/Creatinine Ratio: 32 — ABNORMAL HIGH (ref 12–28)
BUN: 25 mg/dL (ref 10–36)
CO2: 27 mmol/L (ref 20–29)
Calcium: 9.4 mg/dL (ref 8.7–10.3)
Chloride: 104 mmol/L (ref 96–106)
Creatinine, Ser: 0.79 mg/dL (ref 0.57–1.00)
Glucose: 84 mg/dL (ref 70–99)
Potassium: 4.9 mmol/L (ref 3.5–5.2)
Sodium: 145 mmol/L — ABNORMAL HIGH (ref 134–144)
eGFR: 71 mL/min/{1.73_m2} (ref >59)

## 2021-08-11 LAB — CBC
Hematocrit: 40.8 % (ref 34.0–46.6)
Hemoglobin: 14 g/dL (ref 11.1–15.9)
MCH: 31 pg (ref 26.6–33.0)
MCHC: 34.3 g/dL (ref 31.5–35.7)
MCV: 90 fL (ref 79–97)
Platelets: 236 10*3/uL (ref 150–450)
RBC: 4.52 x10E6/uL (ref 3.77–5.28)
RDW: 12.4 % (ref 11.7–15.4)
WBC: 7.4 10*3/uL (ref 3.4–10.8)

## 2021-08-18 NOTE — Progress Notes (Deleted)
MEDICARE ANNUAL WELLNESS VISIT AND FOLLOW UP   Assessment:    Annual Medicare Wellness Visit Due annually  Health maintenance reviewed ***  Essential hypertension Continue medication Monitor blood pressure at home; call if consistently over 150/80 Continue DASH diet.   Reminder to go to the ER if any CP, SOB, nausea, dizziness, severe HA, changes vision/speech, left arm numbness and tingling and jaw pain.  Atherosclerosis of native coronary artery of native heart without angina pectoris  Control blood pressure, cholesterol, glucose, increase exercise.  Followed closely by cardiology No chest pains recently  Chronic diastolic heart failure (HCC) Weights stable, monitor, no symptoms at this time Lasix***  Permanent atrial fibrillation (Misty Blackwell) Followed by coumadin clinic Reminded to go to ER for any head injury, call with concerning signs of bleeding Cardiology Misty Misty Blackwell   Long term current use of anticoagulant therapy Acquired Thrombophilia (Misty Blackwell) Followed by cardiology, Misty Misty Blackwell Coumadin 74m, 1.5 tablets four days a week and 1 tablet three days a week. No S&S of bleeding  Sick sinus syndrome (HCC)/ s/p pacemaker Misty. CSallyanne Kusterfollows; planning generator exchanged 08/24/2021  Gastroesophageal reflux disease, esophagitis presence not specified Symptoms controlled at this time, continue current regiment  lifestyle emphasized  Osteoarthritis, unspecified osteoarthritis type, unspecified site Mild in hands,  Doing well at this time  Vitamin D deficiency At goal at recent check;  Defer vitamin D level  Abnormal glucose Recent A1Cs at goal Discussed diet/exercise, weight management  Defer A1C; check CMP  Pacemaker Followed by cardiology  Medication management CBC, CMP/GFR  Hyperlipidemia No longer on medication secondary to age Continue low cholesterol diet and exercise.  Check lipid panel.   Overweight (BMI 25.0-29.9) Continue to recommend diet heavy in  fruits and veggies and low in animal meats, cheeses, and dairy products, appropriate calorie intake Discuss exercise recommendations routinely Continue to monitor weight at each visit  Malignant neoplasm of upper-outer quadrant of right breast in female, estrogen receptor positive (Misty Blackwell Follows with Oncology, Misty GLindi AdieNeuropathy BLE Soaks feet Misty Blackwell & Misty Blackwell helps ***  Insomnia Doing well at this time Has vistaril PRN ***  Medication Management Continued   No orders of the defined types were placed in this encounter.    Over 30 minutes of face to face interview, exam, counseling, chart review, and critical decision making was performed  Future Appointments  Date Time Provider [place] 08/19/2021  9:30 AM CLiane Comber NP [facility] None  09/03/2021  2:00 PM [facility] DEVICE 1 [facility]  09/21/2021  9:30 AM [facility]06[facility]  7:15 AM [facility] DEVICE REMOTES [facility]  11/23/2021  9:30 AM MUnk Pinto MD [facility] None  11/30/2021 10:40 AM CSanda Klein [facility] 02/05/2022  7:15 AM [facility] DEVICE REMOTES [facility]  02/22/2022  8:15 AM GNicholas Lose MD [facility] None  05/07/2022  7:15 AM [facility] DEVICE REMOTES [facility]  06/28/2022  9:30 AM GNicholas Lose MD [facility] None  08/06/2022  7:15 AM [facility] DEVICE REMOTES [facility]     Plan:   During the course of the visit the patient was educated and counseled about appropriate screening and preventive services including:   Pneumococcal vaccine  Influenza vaccine Td vaccine Prevnar 13 Screening electrocardiogram Screening mammography Bone densitometry screening Colorectal cancer screening Diabetes screening Glaucoma screening Nutrition counseling  Advanced directives: given info/requested copies    Subjective:   Misty Sculleyis a 88y.o.  female who presents for Medicare Annual Wellness Visit and  6 month follow up. She has Hyperlipidemia, mixed; Essential hypertension; Coronary atherosclerosis; GERD; FIBROCYSTIC BREAST DISEASE; Osteoarthritis; Long term current use of anticoagulant therapy; Pacemaker; Vitamin D deficiency; Chronic diastolic heart failure (Misty Blackwell); History of TIA (transient ischemic attack); Chronic atrial fibrillation (Misty Blackwell); Neural foraminal stenosis of cervical spine; Abnormal glucose; SSS (sick sinus syndrome) (Misty Blackwell); Anxiety; Insomnia; Recurrent major 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 (Misty Blackwell); Malignant neoplasm of upper-outer quadrant of right breast in female, estrogen receptor positive (Misty Blackwell); Invasive ductal carcinoma of breast, female, right [name]); and Aortic atherosclerosis (Misty Blackwell) by CXR on 04/03/2020 on their problem list.   She reports overall she is doing well.  No health concerns today.  She has history of breast cancer and had right s/p lumpectomy 8/21.   ([place]): invasive lobular carcinoma, grade 2, 2.4cm, clear margins.  ER 80%, PR 50%, HER-2 negative, Ki-67 5%.  She was started on anastazole with plan for 5 years of treatment, and follows with Misty Misty Blackwell. ***  Patient had negative /Nl Ht caths in x2 in 1993 & 2007. In 2003, her Myoview was Negative for Ischemia.  In 2006 , she had an embolic CVA with Rt hemifacial paresthesias (resolved) and was found in Afib and has been on Coumadin since - monitored [facility]. She had a PPM implanted in 2007 for SSS and is followed by Misty Misty Blackwell. She recently in Jan 2023 had CHF admission, ECHO showed normal left ventricular systolic function and was interpreted as showing pseudo normal filling. She has upcoming PPM generator changeout planned 08/24/2021.   Had probable TIA in 04/2020 with aphasia that resolved, started on statin at that time.   Reports that she has trouble sleeping.  She is using vistaril and not working at times.    She is on lexapro and xanax AS  needed, doing well at this time, no longer having depression. She continues to paint as a hobby.  BMI is There is no height or weight on file to calculate BMI., she has been working on diet and exercise. She exercises every day on a stationary bike. Wt Readings from Last 3 Encounters:  08/10/21 146 lb 12.8 oz (66.6 kg)  05/18/21 145 lb 3.2 oz (65.9 kg)  05/14/21 143 lb 14.4 oz (65.3 kg)   Her blood pressure has been controlled at home, today their BP is  BP Readings from Last 3 Encounters:  08/10/21 (!) 154/62  05/18/21 140/62  05/14/21 (!) 150/70  She does not workout. She denies chest pain, shortness of breath, dizziness.   She is on cholesterol medication *** due to hx TIA with aphasia that resolved in 2021. Her cholesterol is not at goal. The cholesterol last visit was:   Lab Results  Component Value Date   CHOL 165 04/28/2021   HDL 47 (L) 04/28/2021   LDLCALC 97 04/28/2021   TRIG 115 04/28/2021   CHOLHDL 3.5 04/28/2021   She has history of prediabetes (A1c 5.9% 2011) Last A1C in the office was:  Lab Results  Component Value Date   HGBA1C 5.6 04/28/2021   Lab Results  Component Value Date   EGFR 71 08/10/2021   Patient is on Vitamin D supplement for deficiency (13, 2008).   Lab Results  Component Value Date   VD25OH 59 04/28/2021      Medication Review Current Outpatient Medications on File Prior to Visit  Medication Sig Dispense Refill   acidophilus (RISAQUAD) CAPS capsule Take 1 capsule by mouth daily.     anastrozole (ARIMIDEX) 1 MG  tablet TAKE 1 TABLET(1 MG) BY MOUTH DAILY 90 tablet 3   Ascorbic Acid (VITAMIN C) 1000 MG tablet Take 1,000 mg by mouth daily.      Cholecalciferol (VITAMIN D3) 50 MCG (2000 UT) capsule Take 2,000 Units by mouth daily.     clotrimazole-betamethasone (LOTRISONE) cream Apply 1 application topically 2 (two) times daily. 15 g 2   Cyanocobalamin (VITAMIN B 12 PO) Take 1,000 mcg by mouth daily.      diltiazem (CARDIZEM CD) 240 MG 24 hr  capsule Take 240 mg by mouth daily.     furosemide (LASIX) 20 MG tablet Take 2 tablets (40 mg total) by mouth daily. 30 tablet 11   GNP IRON 200 (65 Fe) MG TABS Take 1 tablet by mouth daily.     hydrOXYzine (ATARAX) 50 MG tablet TAKE 1 TO 2 TABLETS BY MOUTH EVERY NIGHT 1 HOUR BEFORE BEDTIME AS NEEDED FOR SLEEP (Patient not taking: Reported on 08/10/2021) 60 tablet 0   irbesartan (AVAPRO) 300 MG tablet TAKE 1 TABLET(300 MG) BY MOUTH DAILY 30 tablet 0   Magnesium 250 MG TABS Take 250 mg by mouth daily.     potassium chloride SA (KLOR-CON M) 20 MEQ tablet Take  1 tablet  2 x /day  for Potassium 180 tablet 1   pregabalin (LYRICA) 25 MG capsule Take 1 capsule (25 mg total) by mouth 2 (two) times daily. 60 capsule 0   warfarin (COUMADIN) 3 MG tablet TAKE 1 TO 1 1/2 TABLET BY MOUTH AS DIRECTED (Patient taking differently: Take 3-4.5 mg by mouth as directed. Take 1 tablet (3 mg) on (Sun, Tues, Thurs) & Take 1.5 tablets (4.5 mg) on (Mon, Wed, Fri, Sat)) 135 tablet 1   zinc gluconate 50 MG tablet Take 50 mg by mouth daily.     No current facility-administered medications on file prior to visit.    Current Problems (verified) Patient Active Problem List   Diagnosis Date Noted   Aortic atherosclerosis ([place]) by CXR on 04/03/2020 01/12/2021   Invasive ductal carcinoma of breast, female, right ([place]) 12/13/2019   Malignant neoplasm of upper-outer quadrant of right breast in female, estrogen receptor positive ([place]) 11/21/2019   Anxiety 06/18/2019   Insomnia 06/18/2019   Recurrent major 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 ([place] Blackwell) 06/18/2019   SSS (sick sinus syndrome) ([place]) 09/26/2018   Abnormal glucose 08/29/2018   Neural foraminal stenosis of cervical spine 04/18/2018   History of TIA (transient ischemic attack) 04/17/2018   Chronic atrial fibrillation (HCC)    Chronic diastolic heart failure ([place]) 11/10/2017   Vitamin D deficiency 08/15/2013   Pacemaker 09/19/2012   Long term current use of anticoagulant  therapy 07/18/2012   Hyperlipidemia, mixed 09/03/2008   Essential hypertension 09/03/2008   Coronary atherosclerosis 09/03/2008   GERD 09/03/2008   FIBROCYSTIC BREAST DISEASE 09/03/2008   Osteoarthritis 09/03/2008    Screening Tests Immunization History  Administered Date(s) Administered   DT (Pediatric) 03/19/2015   Influenza Split 05/04/2011   Influenza, High Dose Seasonal PF 03/19/2014, 12/23/2015, 02/03/2017, 12/26/2018, 01/28/2020   Influenza,inj,quad, With Preservative 05/21/2013   Influenza-Unspecified 02/04/2015, 02/03/2017, 01/28/2020   PFIZER(Purple Top)SARS-COV-2 Vaccination 06/16/2019, 07/09/2019, 03/21/2020   Pneumococcal Conjugate-13 03/19/2014   Pneumococcal Polysaccharide-23 05/04/2011   Pneumococcal-Unspecified 05/03/2001   Td 05/04/2003   Health Maintenance  Topic Date Due   Zoster Vaccines- Shingrix (1 of 2) Never done   DEXA SCAN  Never done   COVID-19 Vaccine (4 - Booster for [place] series) 05/16/2020   INFLUENZA VACCINE  12/01/2021  TETANUS/TDAP  03/18/2024   Pneumonia Vaccine 22+ Years old  Completed   HPV VACCINES  Aged Out   Last colonoscopy: 2010 declines due to age.  Last mammogram: Misty. Lindi Blackwell follows annually  DEXA declines ***  Shingles/Zostavax: Declined  Names of Other Physician/Practitioners you currently use: 1. [facility]- here for primary care 2. Misty. Katy Fitch , eye doctor, last visit 2022 has upcoming appointment 3. Misty. Ronnald Ramp, dentist, last visit 2022 Follows with Derm routinely  Patient Care Team: Misty Pinto, MD as PCP - General (Internal Medicine) [name], [name], MD as PCP - Cardiology (Cardiology) [name], MD (Inactive) as Consulting Physician (Urology) Penni Bombard, MD as Consulting Physician (Neurology) [name], MD as Consulting Physician (Neurology) [name], [name], MD as Consulting Physician (Cardiology) [name], MD (Inactive) as Consulting Physician  (Gastroenterology) [name], RN as Oncology Nurse Navigator [name], RN as Oncology Nurse Navigator  Allergies Allergies  Allergen Reactions   Latex Itching   Ace Inhibitors Other (See Comments)    Unknown reaction   Augmentin [Amoxicillin-Pot Clavulanate] Other (See Comments)   Ciprofloxacin Other (See Comments)   Levaquin [Levofloxacin In D5w] Other (See Comments)   Zocor [Simvastatin] Other (See Comments)   Acrylic Polymer [Carbomer] Itching   Chocolate Other (See Comments)    migraine's    Gabapentin Other (See Comments)    Unsteady gait     SURGICAL HISTORY She  has a past surgical history that includes Cardioversion (11/19/2009); Cardiac catheterization (08/06/2005); Cholecystectomy; Appendectomy; Abdominal hysterectomy; Skin cancer resection; Tonsillectomy (1938); Breast surgery (Left, 1949); Eye surgery (Bilateral, 2013); and Breast lumpectomy with radioactive seed localization (Right, 12/13/2019). FAMILY HISTORY Her family history includes Cirrhosis in her brother; Diabetes in her mother; Heart attack in her father; Heart disease in her father and mother. SOCIAL HISTORY She  reports that she has never smoked. She has never used smokeless tobacco. She reports that she does not drink alcohol and does not use drugs.  MEDICARE WELLNESS OBJECTIVES: Physical activity:   Cardiac risk factors:   Depression/mood screen:      04/28/2021   10:23 PM  Depression screen PHQ 2/9  Decreased Interest 0  Down, Depressed, Hopeless 0  PHQ - 2 Score 0    ADLs:     05/12/2021    8:48 PM 05/12/2021    8:47 PM  In your present state of health, do you have any difficulty performing the following activities:  Hearing?  1  Vision?  0  Difficulty concentrating or making decisions?  1  Comment  some trouble with memory  Walking or climbing stairs?  1  Dressing or bathing?  0  Doing errands, shopping? 1      Cognitive Testing  Alert? Yes  Normal Appearance?Yes  Oriented  to person? Yes  Place? Yes   Time? Yes  Recall of three objects?  Yes  Can perform simple calculations? Yes  Displays appropriate judgment?Yes  Can read the correct time from a watch face?Yes  EOL planning:     Objective:   There were no vitals filed for this visit.  There is no height or weight on file to calculate BMI.  Physical Exam:  There were no vitals taken for this visit.  General Appearance: Well nourished, in no apparent distress. Eyes: PERRLA, EOMs, conjunctiva no swelling or erythema Sinuses: No Frontal/maxillary tenderness ENT/Mouth: Ext aud canals clear, TMs without erythema, bulging. No erythema, swelling, or exudate on post pharynx.  Tonsils  not swollen or erythematous. Hearing normal.  Neck: Supple, thyroid normal.  Respiratory: Respiratory effort normal, BS equal bilaterally without rales, rhonchi, wheezing or stridor.  Cardio: RRR with no MRGs. Brisk peripheral pulses with scant non-pitting edema   Abdomen: Soft, obese/mildly distended, + BS.  Non tender, no guarding, rebound, hernias, masses. Lymphatics: Non tender without lymphadenopathy.  Musculoskeletal: Full ROM, 5/5 strength, normal gait.  Skin: Warm, dry without rashes, lesions; he has fragile skin and numerous small ecchymoses to bilateral upper extremities Neuro: Cranial nerves intact. Normal muscle tone, no cerebellar symptoms. Sensation intact.  Psych: Awake and oriented X 3, normal affect, Insight and Judgment appropriate.   Medicare Attestation I have personally reviewed: The patient's medical and social history Their use of alcohol, tobacco or illicit drugs Their current medications and supplements The patient's functional ability including ADLs,fall risks, home safety risks, cognitive, and hearing and visual impairment Diet and physical activities Evidence for depression or mood disorders  The patient's weight, height, BMI, and visual acuity have been recorded in the chart.  I have made  referrals, counseling, and provided education to the patient based on review of the above and I have provided the patient with a written personalized care plan for preventive services.     Izora Ribas, NP 8:25 AM [facility]

## 2021-08-19 ENCOUNTER — Ambulatory Visit: Payer: Medicare Other | Admitting: Adult Health

## 2021-08-19 ENCOUNTER — Other Ambulatory Visit: Payer: Self-pay | Admitting: Adult Health Nurse Practitioner

## 2021-08-19 DIAGNOSIS — I10 Essential (primary) hypertension: Secondary | ICD-10-CM

## 2021-08-19 DIAGNOSIS — M1990 Unspecified osteoarthritis, unspecified site: Secondary | ICD-10-CM

## 2021-08-19 DIAGNOSIS — I5032 Chronic diastolic (congestive) heart failure: Secondary | ICD-10-CM

## 2021-08-19 DIAGNOSIS — Z8673 Personal history of transient ischemic attack (TIA), and cerebral infarction without residual deficits: Secondary | ICD-10-CM

## 2021-08-19 DIAGNOSIS — Z950 Presence of cardiac pacemaker: Secondary | ICD-10-CM

## 2021-08-19 DIAGNOSIS — Z6824 Body mass index (BMI) 24.0-24.9, adult: Secondary | ICD-10-CM

## 2021-08-19 DIAGNOSIS — E559 Vitamin D deficiency, unspecified: Secondary | ICD-10-CM

## 2021-08-19 DIAGNOSIS — F3341 Major depressive disorder, recurrent, in partial remission: Secondary | ICD-10-CM

## 2021-08-19 DIAGNOSIS — F419 Anxiety disorder, unspecified: Secondary | ICD-10-CM

## 2021-08-19 DIAGNOSIS — G4700 Insomnia, unspecified: Secondary | ICD-10-CM

## 2021-08-19 DIAGNOSIS — I482 Chronic atrial fibrillation, unspecified: Secondary | ICD-10-CM

## 2021-08-19 DIAGNOSIS — I495 Sick sinus syndrome: Secondary | ICD-10-CM

## 2021-08-19 DIAGNOSIS — D6869 Other thrombophilia: Secondary | ICD-10-CM

## 2021-08-19 DIAGNOSIS — Z0000 Encounter for general adult medical examination without abnormal findings: Secondary | ICD-10-CM

## 2021-08-19 DIAGNOSIS — Z7901 Long term (current) use of anticoagulants: Secondary | ICD-10-CM

## 2021-08-19 DIAGNOSIS — R7309 Other abnormal glucose: Secondary | ICD-10-CM

## 2021-08-19 DIAGNOSIS — E782 Mixed hyperlipidemia: Secondary | ICD-10-CM

## 2021-08-19 DIAGNOSIS — I2510 Atherosclerotic heart disease of native coronary artery without angina pectoris: Secondary | ICD-10-CM

## 2021-08-19 DIAGNOSIS — Z170 Estrogen receptor positive status [ER+]: Secondary | ICD-10-CM

## 2021-08-19 DIAGNOSIS — I700 Atherosclerosis of aorta: Secondary | ICD-10-CM

## 2021-08-24 ENCOUNTER — Other Ambulatory Visit: Payer: Self-pay

## 2021-08-24 ENCOUNTER — Encounter (HOSPITAL_COMMUNITY)
Admission: RE | Disposition: A | Discharge status: Home / Self Care | Payer: Self-pay | Source: Home / Self Care | Attending: Cardiovascular Disease

## 2021-08-24 ENCOUNTER — Other Ambulatory Visit: Payer: Self-pay | Admitting: *Deleted

## 2021-08-24 ENCOUNTER — Ambulatory Visit (HOSPITAL_COMMUNITY)
Admission: RE | Admit: 2021-08-24 | Discharge: 2021-08-24 | Disposition: A | Discharge status: Home / Self Care | Payer: Medicare Other | Attending: Cardiovascular Disease | Admitting: Cardiovascular Disease

## 2021-08-24 DIAGNOSIS — I2510 Atherosclerotic heart disease of native coronary artery without angina pectoris: Secondary | ICD-10-CM | POA: Diagnosis not present

## 2021-08-24 DIAGNOSIS — I495 Sick sinus syndrome: Secondary | ICD-10-CM

## 2021-08-24 DIAGNOSIS — D6859 Other primary thrombophilia: Secondary | ICD-10-CM | POA: Diagnosis not present

## 2021-08-24 DIAGNOSIS — Z45010 Encounter for checking and testing of cardiac pacemaker pulse generator [battery]: Secondary | ICD-10-CM | POA: Diagnosis not present

## 2021-08-24 DIAGNOSIS — I4811 Longstanding persistent atrial fibrillation: Secondary | ICD-10-CM

## 2021-08-24 DIAGNOSIS — I110 Hypertensive heart disease with heart failure: Secondary | ICD-10-CM | POA: Diagnosis not present

## 2021-08-24 DIAGNOSIS — I4821 Permanent atrial fibrillation: Secondary | ICD-10-CM | POA: Diagnosis not present

## 2021-08-24 DIAGNOSIS — I5032 Chronic diastolic (congestive) heart failure: Secondary | ICD-10-CM | POA: Insufficient documentation

## 2021-08-24 DIAGNOSIS — Z7901 Long term (current) use of anticoagulants: Secondary | ICD-10-CM | POA: Insufficient documentation

## 2021-08-24 DIAGNOSIS — E782 Mixed hyperlipidemia: Secondary | ICD-10-CM | POA: Insufficient documentation

## 2021-08-24 DIAGNOSIS — Z8673 Personal history of transient ischemic attack (TIA), and cerebral infarction without residual deficits: Secondary | ICD-10-CM | POA: Insufficient documentation

## 2021-08-24 HISTORY — PX: PPM GENERATOR CHANGEOUT: EP1233

## 2021-08-24 LAB — PROTIME-INR
INR: 1.5 — ABNORMAL HIGH (ref 0.8–1.2)
Prothrombin Time: 17.6 seconds — ABNORMAL HIGH (ref 11.4–15.2)

## 2021-08-24 SURGERY — PPM GENERATOR CHANGEOUT

## 2021-08-24 MED ORDER — LIDOCAINE HCL (PF) 1 % IJ SOLN
INTRAMUSCULAR | Status: AC
  Filled 2021-08-24: qty 60

## 2021-08-24 MED ORDER — VANCOMYCIN HCL IN DEXTROSE 1-5 GM/200ML-% IV SOLN
INTRAVENOUS | Status: AC
  Filled 2021-08-24: qty 200

## 2021-08-24 MED ORDER — SODIUM CHLORIDE 0.9% FLUSH: 3.0000 mL | Freq: Two times a day (BID) | INTRAVENOUS | Status: DC

## 2021-08-24 MED ORDER — FENTANYL CITRATE (PF) 100 MCG/2ML IJ SOLN
INTRAMUSCULAR | Status: AC
  Filled 2021-08-24: qty 2

## 2021-08-24 MED ORDER — VANCOMYCIN HCL IN DEXTROSE 1-5 GM/200ML-% IV SOLN
1000.0000 mg | INTRAVENOUS | Status: AC
  Administered 2021-08-24: 1000 mg via INTRAVENOUS
  Filled 2021-08-24: qty 200

## 2021-08-24 MED ORDER — MIDAZOLAM HCL 5 MG/5ML IJ SOLN
INTRAMUSCULAR | Status: AC
  Filled 2021-08-24: qty 5

## 2021-08-24 MED ORDER — ACETAMINOPHEN 325 MG PO TABS: 325.0000 mg | ORAL_TABLET | ORAL | Status: DC | PRN

## 2021-08-24 MED ORDER — SODIUM CHLORIDE 0.9 % IV SOLN
80.0000 mg | INTRAVENOUS | Status: AC
  Administered 2021-08-24: 80 mg
  Filled 2021-08-24: qty 2

## 2021-08-24 MED ORDER — MIDAZOLAM HCL 5 MG/5ML IJ SOLN
INTRAMUSCULAR | Status: DC | PRN
  Administered 2021-08-24: 1 mg via INTRAVENOUS

## 2021-08-24 MED ORDER — LIDOCAINE HCL (PF) 1 % IJ SOLN
INTRAMUSCULAR | Status: DC | PRN
  Administered 2021-08-24: 50 mL

## 2021-08-24 MED ORDER — SODIUM CHLORIDE 0.9 % IV SOLN: 250.0000 mL | INTRAVENOUS | Status: DC | PRN

## 2021-08-24 MED ORDER — CHLORHEXIDINE GLUCONATE 4 % EX LIQD
4.0000 "application " | Freq: Once | CUTANEOUS | Status: DC
  Filled 2021-08-24: qty 60

## 2021-08-24 MED ORDER — FENTANYL CITRATE (PF) 100 MCG/2ML IJ SOLN
INTRAMUSCULAR | Status: DC | PRN
  Administered 2021-08-24: 25 ug via INTRAVENOUS

## 2021-08-24 MED ORDER — SODIUM CHLORIDE 0.9% FLUSH: 3.0000 mL | INTRAVENOUS | Status: DC | PRN

## 2021-08-24 MED ORDER — SODIUM CHLORIDE 0.9 % IV SOLN: INTRAVENOUS | Status: DC

## 2021-08-24 MED ORDER — ONDANSETRON HCL 4 MG/2ML IJ SOLN: 4.0000 mg | Freq: Four times a day (QID) | INTRAMUSCULAR | Status: DC | PRN

## 2021-08-24 MED ORDER — SODIUM CHLORIDE 0.9 % IV SOLN
INTRAVENOUS | Status: AC
  Filled 2021-08-24: qty 2

## 2021-08-24 SURGICAL SUPPLY — 7 items
CABLE SURGICAL S-101-97-12 (CABLE) ×2 IMPLANT
IPG PACE AZUR XT DR MRI W1DR01 (Pacemaker) IMPLANT
PACE AZURE XT DR MRI W1DR01 (Pacemaker) ×2 IMPLANT
PAD DEFIB RADIO PHYSIO CONN (PAD) ×2 IMPLANT
POUCH AIGIS-R ANTIBACT PPM (Mesh General) ×2 IMPLANT
POUCH AIGIS-R ANTIBACT PPM MED (Mesh General) IMPLANT
TRAY PACEMAKER INSERTION (PACKS) ×2 IMPLANT

## 2021-08-24 NOTE — Interval H&P Note (Signed)
History and Physical Interval Note: ? ?08/24/2021 ?2:25 PM ? ?[name]  has presented today for surgery, with the diagnosis of eri.  The various methods of treatment have been discussed with the patient and family. After consideration of risks, benefits and other options for treatment, the patient has consented to  Procedure(s): ?PPM GENERATOR CHANGEOUT (N/A) as a surgical intervention.  The patient's history has been reviewed, patient examined, no change in status, stable for surgery.  I have reviewed the patient's chart and labs.  Questions were answered to the patient's satisfaction.   ? ? ?Misty Blackwell ? ? ?

## 2021-08-24 NOTE — Discharge Instructions (Signed)

## 2021-08-24 NOTE — Op Note (Signed)
Procedure report  ?Procedure performed:  ?1. Dual chamber generator changeout  ?2. Light sedation  ?3. Aegis pouch ?Reason for procedure:  ?1. Device generator at elective replacement interval  ?2. History of tachy-brady sd. ?3. Persistent atrial fibrillation ? ?Procedure performed by:  ?Sanda Klein, MD  ?Complications:  ?None  ?Estimated blood loss:  ?<5 mL  ?Medications administered during procedure:  ?Vancomycin 1 g intravenously, lidocaine 1% 30 mL locally, fentanyl 25 mcg intravenously, Versed 1 mg intravenously ?Device details:  ? ?New Generator Medtronic Azure XT DR model number [id number], serial number [id number] ?Right atrial lead (chronic) Medtronic, model number [id number], serial [id number] (implanted 03/22/2006) ?Right ventricular lead (chronic)  Medtronic, model number [id number], serial number [id number] (implanted 03/22/2006) ? ?Explanted generator Medtronic Adapta DR,  model number ADDR1, serial number  [id number] (implanted 08/28/2009) ? ?Procedure details:  ?After the risks and benefits of the procedure were discussed the patient provided informed consent. She was brought to the cardiac catheter lab in the fasting state. The patient was prepped and draped in usual sterile fashion. Local anesthesia with 1% lidocaine was administered to to the left infraclavicular area. A 5-6cm horizontal incision was made parallel with and 2-3 cm caudal to the left clavicle, in the area of an old scar. An older scar was seen closer to the left clavicle. Using minimal electrocautery and mostly sharp and blunt dissection the prepectoral pocket was opened carefully to avoid injury to the loops of chronic leads. Extensive dissection was not necessary. The device was explanted. The pocket was carefully inspected for hemostasis and flushed with copious amounts of antibiotic solution. ? ?The leads were disconnected from the old generator and testing of the lead parameters later showed excellent values. The new generator was  connected to the chronic leads, with appropriate pacing noted.  ? ?The entire system was then carefully inserted in the pocket with care been taking that the leads and device assumed a comfortable position without pressure on the incision. Great care was taken that the leads be located deep to the generator. The pocket was  then closed in layers using 2 layers of 2-0 Vicryl, one layer of 3-0 Vicryl and cutaneous steristrips after which a sterile dressing was applied.  ? ?At the end of the procedure the following lead parameters were encountered:  ? ?Right atrial lead sensed P waves 1.4 mV (AFib), impedance 532 ohms, threshold N/A. ? ?Right ventricular lead sensed R waves  11.3 mV, impedance 589 ohms, threshold 0.75V at 0.4 ms pulse width. ? ? ?Sanda Klein, MD, [facility] ?[place] ?[phone] office ?[phone] pager ? ?

## 2021-08-25 ENCOUNTER — Encounter (HOSPITAL_COMMUNITY): Payer: Self-pay | Admitting: Cardiovascular Disease

## 2021-08-25 NOTE — Progress Notes (Signed)
Remote pacemaker transmission.   

## 2021-09-02 ENCOUNTER — Other Ambulatory Visit: Payer: Self-pay | Admitting: Nurse Practitioner

## 2021-09-02 DIAGNOSIS — F5101 Primary insomnia: Secondary | ICD-10-CM

## 2021-09-03 ENCOUNTER — Ambulatory Visit (INDEPENDENT_AMBULATORY_CARE_PROVIDER_SITE_OTHER): Payer: Medicare Other

## 2021-09-03 DIAGNOSIS — I482 Chronic atrial fibrillation, unspecified: Secondary | ICD-10-CM | POA: Diagnosis not present

## 2021-09-03 NOTE — Patient Instructions (Addendum)
? ?  After Your Pacemaker ? ? ?Monitor your pacemaker site for redness, swelling, and drainage. Call the device clinic at [phone] if you experience these symptoms or fever/chills. ? ?Your incision was closed with Steri-strips or staples:  You may shower and wash your incision with soap and water. Avoid lotions, ointments, or perfumes over your incision until it is well-healed. ? ?You may use a hot tub or a pool after your wound check appointment if the incision is completely closed. ? ?You may drive, unless driving has been restricted by your healthcare providers. ? ?Your Pacemaker is not  MRI compatible. ? ?Remote monitoring is used to monitor your pacemaker from home. This monitoring is scheduled every 91 days by our office. It allows [place] to keep an eye on the functioning of your device to ensure it is working properly. You will routinely see your Electrophysiologist annually (more often if necessary).  ?

## 2021-09-05 LAB — CUP PACEART INCLINIC DEVICE CHECK
Battery Remaining Longevity: 165 mo
Battery Voltage: 3.22 V
Brady Statistic AP VP Percent: 0 %
Brady Statistic AP VS Percent: 0 %
Brady Statistic AS VP Percent: 84.38 %
Brady Statistic AS VS Percent: 15.62 %
Brady Statistic RA Percent Paced: 0 %
Brady Statistic RV Percent Paced: 84.38 %
Date Time Interrogation Session: 20230504141400
Implantable Lead Implant Date: 20110428
Implantable Lead Implant Date: 20110428
Implantable Lead Location: 753859
Implantable Lead Location: 753860
Implantable Lead Model: 4092
Implantable Lead Model: 4592
Implantable Pulse Generator Implant Date: 20230424
Lead Channel Impedance Value: 1748 Ohm
Lead Channel Impedance Value: 532 Ohm
Lead Channel Impedance Value: 551 Ohm
Lead Channel Impedance Value: 627 Ohm
Lead Channel Pacing Threshold Amplitude: 0.75 V
Lead Channel Pacing Threshold Pulse Width: 0.4 ms
Lead Channel Sensing Intrinsic Amplitude: 1.375 mV
Lead Channel Sensing Intrinsic Amplitude: 10.125 mV
Lead Channel Setting Pacing Amplitude: 2 V
Lead Channel Setting Pacing Pulse Width: 0.4 ms
Lead Channel Setting Sensing Sensitivity: 1.2 mV

## 2021-09-05 NOTE — Progress Notes (Signed)
Wound check appointment. Steri-strips removed. Wound without redness or edema. Incision edges approximated, wound well healed. Normal device function. Thresholds, sensing, and impedances consistent with implant measurements. Device programmed chronic lead setting for RA/RV lead implant 2011. Programmed VVIR for chronic AF. + OAC.  Histogram distribution appropriate for patient and level of activity. No high ventricular rates noted. Patient educated about wound care, arm mobility, lifting restrictions. Patient enrolled in remote monitoring with next transmission scheduled 11/24/21. 91 day post gen change follow up with Dr. Sallyanne Kuster 11/30/21. ?

## 2021-09-21 ENCOUNTER — Ambulatory Visit (INDEPENDENT_AMBULATORY_CARE_PROVIDER_SITE_OTHER): Payer: Medicare Other

## 2021-09-21 DIAGNOSIS — I482 Chronic atrial fibrillation, unspecified: Secondary | ICD-10-CM

## 2021-09-21 DIAGNOSIS — Z7901 Long term (current) use of anticoagulants: Secondary | ICD-10-CM | POA: Diagnosis not present

## 2021-09-21 LAB — POCT INR: INR: 2.4 (ref 2.0–3.0)

## 2021-09-21 NOTE — Patient Instructions (Signed)
-   Continue taking 1.5 tablets daily except 1 tablet Sunday, Tuesday and Thursday.  - Repeat INR 6 weeks.  Call if you have questions [phone] Erasmo Downer)

## 2021-10-02 ENCOUNTER — Inpatient Hospital Stay (HOSPITAL_COMMUNITY)
Admission: EM | Admit: 2021-10-02 | Discharge: 2021-10-06 | DRG: 178 | Disposition: A | Discharge status: Skilled Nursing Facility | Payer: Medicare Other | Attending: Family Medicine | Admitting: Family Medicine

## 2021-10-02 ENCOUNTER — Other Ambulatory Visit: Payer: Self-pay

## 2021-10-02 ENCOUNTER — Other Ambulatory Visit: Payer: Self-pay | Admitting: Nurse Practitioner

## 2021-10-02 ENCOUNTER — Emergency Department (HOSPITAL_COMMUNITY): Payer: Medicare Other

## 2021-10-02 ENCOUNTER — Other Ambulatory Visit: Payer: Self-pay | Admitting: *Deleted

## 2021-10-02 DIAGNOSIS — I110 Hypertensive heart disease with heart failure: Secondary | ICD-10-CM | POA: Diagnosis present

## 2021-10-02 DIAGNOSIS — I10 Essential (primary) hypertension: Secondary | ICD-10-CM | POA: Diagnosis not present

## 2021-10-02 DIAGNOSIS — Z833 Family history of diabetes mellitus: Secondary | ICD-10-CM | POA: Diagnosis not present

## 2021-10-02 DIAGNOSIS — I4892 Unspecified atrial flutter: Secondary | ICD-10-CM | POA: Diagnosis present

## 2021-10-02 DIAGNOSIS — Z950 Presence of cardiac pacemaker: Secondary | ICD-10-CM | POA: Diagnosis not present

## 2021-10-02 DIAGNOSIS — Z85828 Personal history of other malignant neoplasm of skin: Secondary | ICD-10-CM

## 2021-10-02 DIAGNOSIS — Z882 Allergy status to sulfonamides status: Secondary | ICD-10-CM

## 2021-10-02 DIAGNOSIS — S0181XA Laceration without foreign body of other part of head, initial encounter: Secondary | ICD-10-CM

## 2021-10-02 DIAGNOSIS — Z23 Encounter for immunization: Secondary | ICD-10-CM | POA: Diagnosis present

## 2021-10-02 DIAGNOSIS — Z8673 Personal history of transient ischemic attack (TIA), and cerebral infarction without residual deficits: Secondary | ICD-10-CM | POA: Diagnosis not present

## 2021-10-02 DIAGNOSIS — Z91040 Latex allergy status: Secondary | ICD-10-CM | POA: Diagnosis not present

## 2021-10-02 DIAGNOSIS — Y9289 Other specified places as the place of occurrence of the external cause: Secondary | ICD-10-CM | POA: Diagnosis not present

## 2021-10-02 DIAGNOSIS — Z8249 Family history of ischemic heart disease and other diseases of the circulatory system: Secondary | ICD-10-CM

## 2021-10-02 DIAGNOSIS — W0110XA Fall on same level from slipping, tripping and stumbling with subsequent striking against unspecified object, initial encounter: Secondary | ICD-10-CM | POA: Diagnosis present

## 2021-10-02 DIAGNOSIS — J690 Pneumonitis due to inhalation of food and vomit: Secondary | ICD-10-CM | POA: Diagnosis present

## 2021-10-02 DIAGNOSIS — I5032 Chronic diastolic (congestive) heart failure: Secondary | ICD-10-CM | POA: Diagnosis present

## 2021-10-02 DIAGNOSIS — Z20822 Contact with and (suspected) exposure to covid-19: Secondary | ICD-10-CM | POA: Diagnosis present

## 2021-10-02 DIAGNOSIS — R55 Syncope and collapse: Secondary | ICD-10-CM | POA: Diagnosis present

## 2021-10-02 DIAGNOSIS — S0003XA Contusion of scalp, initial encounter: Secondary | ICD-10-CM | POA: Diagnosis present

## 2021-10-02 DIAGNOSIS — Z853 Personal history of malignant neoplasm of breast: Secondary | ICD-10-CM

## 2021-10-02 DIAGNOSIS — F419 Anxiety disorder, unspecified: Secondary | ICD-10-CM | POA: Diagnosis present

## 2021-10-02 DIAGNOSIS — Z7901 Long term (current) use of anticoagulants: Secondary | ICD-10-CM

## 2021-10-02 DIAGNOSIS — Z9109 Other allergy status, other than to drugs and biological substances: Secondary | ICD-10-CM | POA: Diagnosis not present

## 2021-10-02 DIAGNOSIS — I482 Chronic atrial fibrillation, unspecified: Secondary | ICD-10-CM | POA: Diagnosis present

## 2021-10-02 DIAGNOSIS — R0600 Dyspnea, unspecified: Secondary | ICD-10-CM | POA: Diagnosis present

## 2021-10-02 DIAGNOSIS — R109 Unspecified abdominal pain: Secondary | ICD-10-CM | POA: Diagnosis present

## 2021-10-02 DIAGNOSIS — I951 Orthostatic hypotension: Secondary | ICD-10-CM | POA: Diagnosis present

## 2021-10-02 DIAGNOSIS — G629 Polyneuropathy, unspecified: Secondary | ICD-10-CM | POA: Diagnosis present

## 2021-10-02 DIAGNOSIS — Z888 Allergy status to other drugs, medicaments and biological substances status: Secondary | ICD-10-CM | POA: Diagnosis not present

## 2021-10-02 DIAGNOSIS — R296 Repeated falls: Secondary | ICD-10-CM | POA: Diagnosis present

## 2021-10-02 DIAGNOSIS — D509 Iron deficiency anemia, unspecified: Secondary | ICD-10-CM | POA: Diagnosis present

## 2021-10-02 DIAGNOSIS — I495 Sick sinus syndrome: Secondary | ICD-10-CM | POA: Diagnosis present

## 2021-10-02 DIAGNOSIS — M549 Dorsalgia, unspecified: Secondary | ICD-10-CM

## 2021-10-02 DIAGNOSIS — W19XXXA Unspecified fall, initial encounter: Secondary | ICD-10-CM | POA: Diagnosis not present

## 2021-10-02 DIAGNOSIS — I4821 Permanent atrial fibrillation: Secondary | ICD-10-CM | POA: Diagnosis present

## 2021-10-02 DIAGNOSIS — M546 Pain in thoracic spine: Secondary | ICD-10-CM | POA: Diagnosis present

## 2021-10-02 DIAGNOSIS — Z751 Person awaiting admission to adequate facility elsewhere: Secondary | ICD-10-CM

## 2021-10-02 DIAGNOSIS — Z9181 History of falling: Secondary | ICD-10-CM

## 2021-10-02 DIAGNOSIS — Z79899 Other long term (current) drug therapy: Secondary | ICD-10-CM

## 2021-10-02 LAB — COMPREHENSIVE METABOLIC PANEL
ALT: 15 U/L (ref 0–44)
AST: 20 U/L (ref 15–41)
Albumin: 3.8 g/dL (ref 3.5–5.0)
Alkaline Phosphatase: 106 U/L (ref 38–126)
Anion gap: 10 (ref 5–15)
BUN: 20 mg/dL (ref 8–23)
CO2: 25 mmol/L (ref 22–32)
Calcium: 9.2 mg/dL (ref 8.9–10.3)
Chloride: 105 mmol/L (ref 98–111)
Creatinine, Ser: 0.85 mg/dL (ref 0.44–1.00)
GFR, Estimated: >60 mL/min (ref >60)
Glucose, Bld: 121 mg/dL — ABNORMAL HIGH (ref 70–99)
Potassium: 4.4 mmol/L (ref 3.5–5.1)
Sodium: 140 mmol/L (ref 135–145)
Total Bilirubin: 1.1 mg/dL (ref 0.3–1.2)
Total Protein: 7.2 g/dL (ref 6.5–8.1)

## 2021-10-02 LAB — CBC
HCT: 41.3 % (ref 36.0–46.0)
Hemoglobin: 13.1 g/dL (ref 12.0–15.0)
MCH: 29.6 pg (ref 26.0–34.0)
MCHC: 31.7 g/dL (ref 30.0–36.0)
MCV: 93.4 fL (ref 80.0–100.0)
Platelets: 186 10*3/uL (ref 150–400)
RBC: 4.42 MIL/uL (ref 3.87–5.11)
RDW: 12.1 % (ref 11.5–15.5)
WBC: 16.5 10*3/uL — ABNORMAL HIGH (ref 4.0–10.5)
nRBC: 0 % (ref 0.0–0.2)

## 2021-10-02 LAB — URINALYSIS, ROUTINE W REFLEX MICROSCOPIC
Bilirubin Urine: NEGATIVE
Glucose, UA: NEGATIVE mg/dL
Hgb urine dipstick: NEGATIVE
Ketones, ur: NEGATIVE mg/dL
Nitrite: NEGATIVE
Protein, ur: NEGATIVE mg/dL
Specific Gravity, Urine: 1.041 — ABNORMAL HIGH (ref 1.005–1.030)
pH: 6 (ref 5.0–8.0)

## 2021-10-02 LAB — SAMPLE TO BLOOD BANK

## 2021-10-02 LAB — I-STAT CHEM 8, ED
BUN: 24 mg/dL — ABNORMAL HIGH (ref 8–23)
Calcium, Ion: 1.07 mmol/L — ABNORMAL LOW (ref 1.15–1.40)
Chloride: 103 mmol/L (ref 98–111)
Creatinine, Ser: 0.8 mg/dL (ref 0.44–1.00)
Glucose, Bld: 118 mg/dL — ABNORMAL HIGH (ref 70–99)
HCT: 41 % (ref 36.0–46.0)
Hemoglobin: 13.9 g/dL (ref 12.0–15.0)
Potassium: 4.3 mmol/L (ref 3.5–5.1)
Sodium: 139 mmol/L (ref 135–145)
TCO2: 28 mmol/L (ref 22–32)

## 2021-10-02 LAB — PROTIME-INR
INR: 1.7 — ABNORMAL HIGH (ref 0.8–1.2)
Prothrombin Time: 19.8 seconds — ABNORMAL HIGH (ref 11.4–15.2)

## 2021-10-02 LAB — ETHANOL: Alcohol, Ethyl (B): <10 mg/dL (ref <10)

## 2021-10-02 LAB — LACTIC ACID, PLASMA: Lactic Acid, Venous: 0.9 mmol/L (ref 0.5–1.9)

## 2021-10-02 LAB — RESP PANEL BY RT-PCR (FLU A&B, COVID) ARPGX2
Influenza A by PCR: NEGATIVE
Influenza B by PCR: NEGATIVE
SARS Coronavirus 2 by RT PCR: NEGATIVE

## 2021-10-02 LAB — TROPONIN I (HIGH SENSITIVITY)
Troponin I (High Sensitivity): 13 ng/L (ref <18)
Troponin I (High Sensitivity): 15 ng/L (ref <18)

## 2021-10-02 MED ORDER — ENOXAPARIN SODIUM 80 MG/0.8ML IJ SOSY
1.0000 mg/kg | PREFILLED_SYRINGE | Freq: Two times a day (BID) | INTRAMUSCULAR | Status: DC
  Filled 2021-10-02: qty 0.65

## 2021-10-02 MED ORDER — FERROUS SULFATE 325 (65 FE) MG PO TABS
325.0000 mg | ORAL_TABLET | Freq: Every day | ORAL | Status: DC
  Administered 2021-10-03 – 2021-10-06 (×4): 325 mg via ORAL
  Filled 2021-10-02 (×4): qty 1

## 2021-10-02 MED ORDER — HYDROMORPHONE HCL 1 MG/ML IJ SOLN: 0.5000 mg | INTRAMUSCULAR | Status: DC | PRN

## 2021-10-02 MED ORDER — DILTIAZEM HCL ER COATED BEADS 240 MG PO CP24
240.0000 mg | ORAL_CAPSULE | Freq: Every day | ORAL | 6 refills | Status: DC
Start: 2021-10-02 — End: 2021-11-30

## 2021-10-02 MED ORDER — RISAQUAD PO CAPS
1.0000 | ORAL_CAPSULE | Freq: Every day | ORAL | Status: DC
  Administered 2021-10-03 – 2021-10-06 (×4): 1 via ORAL
  Filled 2021-10-02 (×4): qty 1

## 2021-10-02 MED ORDER — SODIUM CHLORIDE 0.9 % IV SOLN
2.0000 g | INTRAVENOUS | Status: DC
  Administered 2021-10-03: 2 g via INTRAVENOUS
  Filled 2021-10-02: qty 20

## 2021-10-02 MED ORDER — LACTATED RINGERS IV SOLN
INTRAVENOUS | Status: AC
Start: 2021-10-02 — End: 2021-10-03

## 2021-10-02 MED ORDER — AZITHROMYCIN 500 MG IV SOLR
500.0000 mg | Freq: Once | INTRAVENOUS | Status: AC
  Administered 2021-10-02: 500 mg via INTRAVENOUS
  Filled 2021-10-02: qty 5

## 2021-10-02 MED ORDER — IOHEXOL 300 MG/ML  SOLN
85.0000 mL | Freq: Once | INTRAMUSCULAR | Status: AC | PRN
  Administered 2021-10-02: 85 mL via INTRAVENOUS

## 2021-10-02 MED ORDER — ONDANSETRON HCL 4 MG/2ML IJ SOLN: 4.0000 mg | Freq: Four times a day (QID) | INTRAMUSCULAR | Status: DC | PRN

## 2021-10-02 MED ORDER — SODIUM CHLORIDE 0.9 % IV SOLN
1.0000 g | Freq: Once | INTRAVENOUS | Status: AC
  Administered 2021-10-02: 1 g via INTRAVENOUS
  Filled 2021-10-02: qty 10

## 2021-10-02 MED ORDER — VITAMIN B-12 1000 MCG PO TABS
1000.0000 ug | ORAL_TABLET | Freq: Every day | ORAL | Status: DC
  Administered 2021-10-03 – 2021-10-06 (×4): 1000 ug via ORAL
  Filled 2021-10-02 (×4): qty 1

## 2021-10-02 MED ORDER — MELATONIN 5 MG PO TABS: 5.0000 mg | ORAL_TABLET | Freq: Every evening | ORAL | Status: DC | PRN

## 2021-10-02 MED ORDER — OXYCODONE HCL 5 MG PO TABS: 5.0000 mg | ORAL_TABLET | Freq: Four times a day (QID) | ORAL | Status: DC | PRN

## 2021-10-02 MED ORDER — ACETAMINOPHEN 325 MG PO TABS
650.0000 mg | ORAL_TABLET | Freq: Four times a day (QID) | ORAL | Status: DC | PRN
  Administered 2021-10-03 – 2021-10-05 (×5): 650 mg via ORAL
  Filled 2021-10-02 (×5): qty 2

## 2021-10-02 MED ORDER — POLYETHYLENE GLYCOL 3350 17 G PO PACK
17.0000 g | PACK | Freq: Every day | ORAL | Status: DC | PRN
  Administered 2021-10-05: 17 g via ORAL
  Filled 2021-10-02: qty 1

## 2021-10-02 MED ORDER — GUAIFENESIN-DM 100-10 MG/5ML PO SYRP
5.0000 mL | ORAL_SOLUTION | ORAL | Status: DC | PRN
  Administered 2021-10-05 (×3): 5 mL via ORAL
  Filled 2021-10-02 (×3): qty 5

## 2021-10-02 MED ORDER — LIDOCAINE-EPINEPHRINE (PF) 2 %-1:200000 IJ SOLN
20.0000 mL | Freq: Once | INTRAMUSCULAR | Status: AC
  Administered 2021-10-02: 20 mL
  Filled 2021-10-02: qty 20

## 2021-10-02 MED ORDER — WARFARIN SODIUM 5 MG PO TABS
5.0000 mg | ORAL_TABLET | Freq: Once | ORAL | Status: AC
Start: 2021-10-02 — End: 2021-10-02
  Administered 2021-10-02: 5 mg via ORAL
  Filled 2021-10-02 (×2): qty 1

## 2021-10-02 MED ORDER — WARFARIN - PHARMACIST DOSING INPATIENT: Freq: Every day | Status: DC

## 2021-10-02 MED ORDER — CLOTRIMAZOLE 1 % EX CREA
TOPICAL_CREAM | Freq: Two times a day (BID) | CUTANEOUS | Status: DC
  Filled 2021-10-02 (×2): qty 15

## 2021-10-02 MED ORDER — HYDROXYZINE HCL 25 MG PO TABS
100.0000 mg | ORAL_TABLET | Freq: Every day | ORAL | Status: DC
  Administered 2021-10-02 – 2021-10-05 (×3): 100 mg via ORAL
  Filled 2021-10-02 (×4): qty 4

## 2021-10-02 MED ORDER — TETANUS-DIPHTH-ACELL PERTUSSIS 5-2.5-18.5 LF-MCG/0.5 IM SUSY
0.5000 mL | PREFILLED_SYRINGE | Freq: Once | INTRAMUSCULAR | Status: AC
  Administered 2021-10-02: 0.5 mL via INTRAMUSCULAR

## 2021-10-02 MED ORDER — IRBESARTAN 300 MG PO TABS
300.0000 mg | ORAL_TABLET | Freq: Every day | ORAL | Status: DC
  Administered 2021-10-03 – 2021-10-06 (×4): 300 mg via ORAL
  Filled 2021-10-02 (×4): qty 1

## 2021-10-02 MED ORDER — ACETAMINOPHEN 325 MG PO TABS
650.0000 mg | ORAL_TABLET | Freq: Once | ORAL | Status: AC
  Administered 2021-10-02: 650 mg via ORAL
  Filled 2021-10-02: qty 2

## 2021-10-02 MED ORDER — IPRATROPIUM-ALBUTEROL 0.5-2.5 (3) MG/3ML IN SOLN
3.0000 mL | Freq: Four times a day (QID) | RESPIRATORY_TRACT | Status: DC
  Administered 2021-10-02 – 2021-10-03 (×3): 3 mL via RESPIRATORY_TRACT
  Filled 2021-10-02 (×3): qty 3

## 2021-10-02 MED ORDER — ANASTROZOLE 1 MG PO TABS
1.0000 mg | ORAL_TABLET | Freq: Every day | ORAL | Status: DC
  Administered 2021-10-03 – 2021-10-06 (×4): 1 mg via ORAL
  Filled 2021-10-02 (×4): qty 1

## 2021-10-02 MED ORDER — SODIUM CHLORIDE 0.9 % IV SOLN: 500.0000 mg | INTRAVENOUS | Status: DC

## 2021-10-02 MED ORDER — ASCORBIC ACID 500 MG PO TABS
1000.0000 mg | ORAL_TABLET | Freq: Every day | ORAL | Status: DC
  Administered 2021-10-03 – 2021-10-06 (×4): 1000 mg via ORAL
  Filled 2021-10-02 (×4): qty 2

## 2021-10-02 MED ORDER — VITAMIN D 25 MCG (1000 UNIT) PO TABS
5000.0000 [IU] | ORAL_TABLET | Freq: Every day | ORAL | Status: DC
  Administered 2021-10-03 – 2021-10-06 (×4): 5000 [IU] via ORAL
  Filled 2021-10-02 (×4): qty 5

## 2021-10-02 NOTE — ED Notes (Signed)
Trauma Response Nurse Documentation   Misty Blackwell is a 88 y.o. female arriving to [facility] via [facility]  On warfarin daily. Trauma was activated as a Level 2 by Carlis Abbott, [place] nurse based on the following trauma criteria Elderly patients > 65 with head trauma on anti-coagulation (excluding ASA). Trauma team at the bedside on patient arrival. Patient cleared for CT by Dr. Johnney Killian. Patient to CT with team. GCS 15.  History   Past Medical History:  Diagnosis Date   Anxiety    Atrial fib/flutter, transient    Atrial fibrillation, chronic (HCC)    CHF (congestive heart failure) ([place]) 10/29/2009   Echo - EF >55%; normal LV size and systolic function; unable to assess diastolic fcn due to E/A fusion, pulmonary vein flow pattern suggests elevated filling pressure; marked biatrail dilation, mild/mod tricuspid regurgitation; mod pulmonary htn; mild/mod mitral regurgitation; although echocardiographic features are incomplete findings suggest possible infiltrative cardiomyopathy (maybe amyloidosi   Coronary artery disease 03/19/2002   R/P Cardiolite - EF 76%; nromal static and dynamic myocardial perfusion images; normal wall motion and endocardial thickening in all vascular territories   Dyspnea    Dysrhythmia    Facial numbness 12/26/2008   carotid doppler - R and L ICAs 0-49% diameter reduction (velocities suggest low end of scale)   Hypertension    Pacemaker    Peripheral neuropathy    Pneumonia 2017   Skin cancer    s/p surgical removal.   Stroke ([place]) 04/2017   TIA (transient ischemic attack)      Past Surgical History:  Procedure Laterality Date   ABDOMINAL HYSTERECTOMY     APPENDECTOMY     BREAST LUMPECTOMY WITH RADIOACTIVE SEED LOCALIZATION Right 12/13/2019   Procedure: RIGHT BREAST LUMPECTOMY WITH RADIOACTIVE SEED LOCALIZATION;  Surgeon: Donnie Mesa, MD;  Location: [facility];  Service: General;  Laterality: Right;  LMA VS MAC   BREAST SURGERY Left 1949    CARDIAC CATHETERIZATION  08/06/2005   minimal coronary disease predominant RCA; no significant atherosclerosis; new onset sick sinus syndrome and atrial flutter w/ ventricular response, controlled on med therapy; systemic HTN, normal renal arteries   CARDIOVERSION  11/19/2009   successful DCCV from AF to sinus type rhythm   CHOLECYSTECTOMY     EYE SURGERY Bilateral 2013   PPM GENERATOR CHANGEOUT N/A 08/24/2021   Procedure: PPM GENERATOR CHANGEOUT;  Surgeon: Sanda Klein, MD;  Location: [facility];  Service: Cardiovascular;  Laterality: N/A;   Skin cancer resection     TONSILLECTOMY  1938       Initial Focused Assessment (If applicable, or please see trauma documentation): Airway- intact Breathing- Unlabored, denies chest pain-  Circulation- bleeding -continuous oozing, squirting sm amount of blood from lacreration over left eyebrow- bleeding controlled with pressure.  Pt is A/O x 4, initial LOC at scene, but was awake when EMS arrived.   CT's Completed:   CT Head, CT Maxillofacial, and CT C-Spine   Interventions:  Labs Xrays CT scans TDap Wound care Pain management   Event Summary:  Pt lives in her own home, and drove herself to the [employer], tripped in parking lot, fell face first- hitting left side of head. Moderate amount of bleeding for EMS, controlled with pressure in ED>  Pt is tearful and anxious, concerned about her car and bags that are at the store.  Is able to move all extremities,  PERLA- pupils 2 and reactive.    Bedside handoff with  ED RN Misty Blackwell.    Misty Blackwell  Trauma Response RN  Please call TRN at [phone] for further assistance.

## 2021-10-02 NOTE — ED Provider Notes (Addendum)
[facility] EMERGENCY DEPARTMENT Provider Note   CSN: [id number] Arrival date & time: 10/02/21  1358     History  Chief Complaint  Patient presents with   level 2 fall    Misty Blackwell is a 88 y.o. female.  Misty Blackwell is a 88 year old female who presents as a level 2 fall on thinners.  Patient had syncopal episode.  Patient was walking out of the store when the next thing she remembers is being in the back of an ambulance.  She denies any prodromal symptoms.  She is anxious and crying on exam.  The history is provided by the patient. No language interpreter was used.      Home Medications Prior to Admission medications   Medication Sig Start Date End Date Taking? Authorizing Provider  acidophilus (RISAQUAD) CAPS capsule Take 1 capsule by mouth daily.    [provider]  anastrozole (ARIMIDEX) 1 MG tablet TAKE 1 TABLET(1 MG) BY MOUTH DAILY 07/02/21   [name], MD  Ascorbic Acid (VITAMIN C) 1000 MG tablet Take 1,000 mg by mouth daily.     [provider]  Cholecalciferol (VITAMIN D3) 50 MCG (2000 UT) capsule Take 2,000 Units by mouth daily. 04/09/20   [provider]  clotrimazole-betamethasone (LOTRISONE) cream APPLY TOPICALLY TO THE AFFECTED AREA TWICE DAILY 08/19/21   [name], MD  Cyanocobalamin (VITAMIN B 12 PO) Take 1,000 mcg by mouth daily.     [provider]  diltiazem (CARDIZEM CD) 240 MG 24 hr capsule Take 1 capsule (240 mg total) by mouth daily. 10/02/21   [name], Mihai, MD  furosemide (LASIX) 20 MG tablet Take 2 tablets (40 mg total) by mouth daily. 05/18/21   [name], Mihai, MD  GNP IRON 200 (65 Fe) MG TABS Take 1 tablet by mouth daily. 04/09/20   [provider]  hydrOXYzine (ATARAX) 50 MG tablet TAKE 1 TO 2 TABLETS BY MOUTH EVERY NIGHT 1 HOUR BEFORE BEDTIME AS NEEDED FOR SLEEP 09/02/21   Liane Comber, NP  irbesartan (AVAPRO) 300 MG tablet TAKE 1 TABLET(300 MG) BY MOUTH DAILY 09/02/21    Liane Comber, NP  Magnesium 250 MG TABS Take 250 mg by mouth daily.    [provider]  OVER THE COUNTER MEDICATION Apply 1 application. topically at bedtime. Neuropathy maximum strength Nerve relief and recovery    [provider]  potassium chloride SA (KLOR-CON M) 20 MEQ tablet Take  1 tablet  2 x /day  for Potassium 08/03/21   [name], MD  pregabalin (LYRICA) 25 MG capsule Take 1 capsule (25 mg total) by mouth 2 (two) times daily. Patient not taking: Reported on 08/19/2021 06/15/21   [name], MD  warfarin (COUMADIN) 3 MG tablet TAKE 1 TO 1 AND 1/2 TABLET BY MOUTH AS DIRECTED 10/02/21   Magda Bernheim, NP  zinc gluconate 50 MG tablet Take 50 mg by mouth daily.    [provider]      Allergies    Latex, Ace inhibitors, Augmentin [amoxicillin-pot clavulanate], Ciprofloxacin, Levaquin [levofloxacin in d5w], Zocor [simvastatin], Acrylic polymer [carbomer], Chocolate, and Gabapentin    Review of Systems   Review of Systems  Unable to perform ROS: Acuity of condition  Constitutional:  Negative for chills and fever.  Cardiovascular:  Negative for chest pain and palpitations.  Skin:  Positive for wound.  Neurological:  Positive for syncope. Negative for weakness and light-headedness.  All other systems reviewed and are negative.  Physical Exam Updated  Vital Signs BP 138/65   Pulse 77   Temp 99 F (37.2 C) (Oral)   Resp 16   Ht '5\' 4"'$  (1.626 m)   Wt 65.3 kg   SpO2 90%   BMI 24.72 kg/m  Physical Exam Vitals and nursing note reviewed.  Constitutional:      General: She is not in acute distress.    Appearance: Normal appearance. She is not ill-appearing.  HENT:     Head: Normocephalic and atraumatic.     Comments: 3 lacerations noted to left side of forehead.  Active bleeding noted.  Multiple bruising noted as well.    Nose: Nose normal.  Eyes:     General: No scleral icterus.    Extraocular Movements: Extraocular movements intact.      Conjunctiva/sclera: Conjunctivae normal.  Cardiovascular:     Rate and Rhythm: Normal rate and regular rhythm.     Pulses: Normal pulses.  Pulmonary:     Effort: Pulmonary effort is normal. No respiratory distress.     Breath sounds: Normal breath sounds. No wheezing or rales.  Abdominal:     General: There is no distension.     Tenderness: There is no abdominal tenderness.  Musculoskeletal:        General: Normal range of motion.     Cervical back: Normal range of motion.  Skin:    General: Skin is warm and dry.  Neurological:     General: No focal deficit present.     Mental Status: She is alert. Mental status is at baseline.    ED Results / Procedures / Treatments   Labs (all labs ordered are listed, but only abnormal results are displayed) Labs Reviewed  COMPREHENSIVE METABOLIC PANEL - Abnormal; Notable for the following components:      Result Value   Glucose, Bld 121 (*)    All other components within normal limits  CBC - Abnormal; Notable for the following components:   WBC 16.5 (*)    All other components within normal limits  PROTIME-INR - Abnormal; Notable for the following components:   Prothrombin Time 19.8 (*)    INR 1.7 (*)    All other components within normal limits  I-STAT CHEM 8, ED - Abnormal; Notable for the following components:   BUN 24 (*)    Glucose, Bld 118 (*)    Calcium, Ion 1.07 (*)    All other components within normal limits  RESP PANEL BY RT-PCR (FLU A&B, COVID) ARPGX2  ETHANOL  URINALYSIS, ROUTINE W REFLEX MICROSCOPIC  LACTIC ACID, PLASMA  SAMPLE TO BLOOD BANK    EKG None  Radiology CT Head Wo Contrast  Result Date: 10/02/2021 CLINICAL DATA:  Head trauma, moderate-severe EXAM: CT HEAD WITHOUT CONTRAST CT MAXILLOFACIAL WITHOUT CONTRAST TECHNIQUE: Multidetector CT imaging of the head and maxillofacial structures were performed using the standard protocol without intravenous contrast. Multiplanar CT image reconstructions of the  maxillofacial structures were also generated. RADIATION DOSE REDUCTION: This exam was performed according to the departmental dose-optimization program which includes automated exposure control, adjustment of the mA and/or kV according to patient size and/or use of iterative reconstruction technique. COMPARISON:  December 2021 FINDINGS: CT HEAD FINDINGS Brain: No acute intracranial hemorrhage, mass effect or edema. Gray-white differentiation is preserved. Prominence of the ventricles and sulci reflects similar parenchymal volume loss. Minimal patchy hypoattenuation in the supratentorial white matter is nonspecific but may reflect similar minor chronic microvascular ischemic changes. No extra-axial collection. Vascular: There is intracranial atherosclerotic calcification at  the skull base. Skull: Unremarkable. Other: Left frontal scalp hematoma. CT MAXILLOFACIAL FINDINGS Osseous: No acute facial fracture. Degenerative changes at the left temporomandibular joint. Orbits: No intraorbital hematoma. Sinuses: Mild mucosal thickening. Soft tissues: Left frontal scalp and periorbital soft tissue swelling with hematoma and laceration. IMPRESSION: No evidence of acute intracranial injury.  No acute facial fracture. Electronically Signed   By: Macy Mis M.D.   On: 10/02/2021 14:48   CT Cervical Spine Wo Contrast  Result Date: 10/02/2021 CLINICAL DATA:  Polytrauma, blunt; Low back pain, trauma; Mid-back pain. EXAM: CT CERVICAL, THORACIC, AND LUMBAR SPINE WITHOUT CONTRAST TECHNIQUE: Multidetector CT imaging of the cervical, thoracic and lumbar spine was performed without intravenous contrast. Multiplanar CT image reconstructions were also generated. RADIATION DOSE REDUCTION: This exam was performed according to the departmental dose-optimization program which includes automated exposure control, adjustment of the mA and/or kV according to patient size and/or use of iterative reconstruction technique. COMPARISON:  CT  angiography chest 05/12/2021, CT cervical spine 04/05/2020 FINDINGS: CT CERVICAL SPINE FINDINGS Alignment: Normal. Skull base and vertebrae: Multilevel degenerative changes spine most prominent at the C4-C5 level. Associated severe right osseous neural foraminal stenosis at the C4-C5 level. No severe osseous central canal stenosis. No acute fracture. No aggressive appearing focal osseous lesion or focal pathologic process. Soft tissues and spinal canal: No prevertebral fluid or swelling. No visible canal hematoma. Upper chest: Biapical pleural/pulmonary scarring. Other: None. CT THORACIC SPINE FINDINGS Alignment: Normal. Vertebrae: No acute fracture or focal pathologic process. Paraspinal and other soft tissues: Negative. Disc levels: Multilevel neural intervertebral disc space vacuum phenomenon. Otherwise maintained. CT LUMBAR SPINE FINDINGS Segmentation: 5 lumbar type vertebrae. Alignment: Levocurvature of the lumbar spine centered at the L2-L3 level may be positional. Vertebrae: Multilevel osteophyte formation and facet arthropathy. Multilevel posterior disc osteophyte complex. No severe osseous neural foraminal or central canal stenosis. No acute fracture or focal pathologic process. Paraspinal and other soft tissues: Negative. Disc levels: Multilevel intervertebral disc space narrowing and vacuum phenomenon. Other: Atherosclerotic plaque. IMPRESSION: 1. No acute displaced fracture or traumatic listhesis of the cervical spine. 2. No acute displaced fracture or traumatic listhesis of the thoracic spine. 3. No acute displaced fracture or traumatic listhesis of the lumbar spine. 4. Severe osseous neural foraminal stenosis at the right C4-C5 level. 5. Please see separately dictated CT head, chest, abdomen, pelvis 10/02/2021. 6.  Aortic Atherosclerosis (ICD10-I70.0). Electronically Signed   By: Iven Finn M.D.   On: 10/02/2021 15:20   CT Lumbar Spine Wo Contrast  Result Date: 10/02/2021 CLINICAL DATA:   Polytrauma, blunt; Low back pain, trauma; Mid-back pain. EXAM: CT CERVICAL, THORACIC, AND LUMBAR SPINE WITHOUT CONTRAST TECHNIQUE: Multidetector CT imaging of the cervical, thoracic and lumbar spine was performed without intravenous contrast. Multiplanar CT image reconstructions were also generated. RADIATION DOSE REDUCTION: This exam was performed according to the departmental dose-optimization program which includes automated exposure control, adjustment of the mA and/or kV according to patient size and/or use of iterative reconstruction technique. COMPARISON:  CT angiography chest 05/12/2021, CT cervical spine 04/05/2020 FINDINGS: CT CERVICAL SPINE FINDINGS Alignment: Normal. Skull base and vertebrae: Multilevel degenerative changes spine most prominent at the C4-C5 level. Associated severe right osseous neural foraminal stenosis at the C4-C5 level. No severe osseous central canal stenosis. No acute fracture. No aggressive appearing focal osseous lesion or focal pathologic process. Soft tissues and spinal canal: No prevertebral fluid or swelling. No visible canal hematoma. Upper chest: Biapical pleural/pulmonary scarring. Other: None. CT THORACIC SPINE FINDINGS Alignment: Normal.  Vertebrae: No acute fracture or focal pathologic process. Paraspinal and other soft tissues: Negative. Disc levels: Multilevel neural intervertebral disc space vacuum phenomenon. Otherwise maintained. CT LUMBAR SPINE FINDINGS Segmentation: 5 lumbar type vertebrae. Alignment: Levocurvature of the lumbar spine centered at the L2-L3 level may be positional. Vertebrae: Multilevel osteophyte formation and facet arthropathy. Multilevel posterior disc osteophyte complex. No severe osseous neural foraminal or central canal stenosis. No acute fracture or focal pathologic process. Paraspinal and other soft tissues: Negative. Disc levels: Multilevel intervertebral disc space narrowing and vacuum phenomenon. Other: Atherosclerotic plaque.  IMPRESSION: 1. No acute displaced fracture or traumatic listhesis of the cervical spine. 2. No acute displaced fracture or traumatic listhesis of the thoracic spine. 3. No acute displaced fracture or traumatic listhesis of the lumbar spine. 4. Severe osseous neural foraminal stenosis at the right C4-C5 level. 5. Please see separately dictated CT head, chest, abdomen, pelvis 10/02/2021. 6.  Aortic Atherosclerosis (ICD10-I70.0). Electronically Signed   By: Iven Finn M.D.   On: 10/02/2021 15:20   DG Pelvis Portable  Result Date: 10/02/2021 CLINICAL DATA:  Fall, level 2 trauma. EXAM: PORTABLE PELVIS 1-2 VIEWS COMPARISON:  None Available. FINDINGS: There is no evidence of pelvic fracture or diastasis. No pelvic bone lesions are seen. IMPRESSION: No acute osseous abnormality. Electronically Signed   By: Dahlia Bailiff M.D.   On: 10/02/2021 14:25   CT CHEST ABDOMEN PELVIS W CONTRAST  Result Date: 10/02/2021 CLINICAL DATA:  Fall with chest pain and abdominal pain, on Coumadin. EXAM: CT CHEST, ABDOMEN, AND PELVIS WITH CONTRAST TECHNIQUE: Multidetector CT imaging of the chest, abdomen and pelvis was performed following the standard protocol during bolus administration of intravenous contrast. RADIATION DOSE REDUCTION: This exam was performed according to the departmental dose-optimization program which includes automated exposure control, adjustment of the mA and/or kV according to patient size and/or use of iterative reconstruction technique. CONTRAST:  42m OMNIPAQUE IOHEXOL 300 MG/ML  SOLN COMPARISON:  CT chest 05/12/2021. FINDINGS: CT CHEST FINDINGS Cardiovascular: Atherosclerotic calcification of the aorta and coronary arteries. Enlarged pulmonic trunk and heart. No pericardial effusion. Mediastinum/Nodes: 1.8 cm minimally hypodense nodule in the right thyroid. No follow-up recommended unless clinically warranted. (Ref: J Am Coll Radiol. 2015 Feb;12(2): 143-50). Mediastinal lymph nodes are not enlarged by CT  size criteria. No hilar or axillary adenopathy. Esophagus is grossly unremarkable. Lungs/Pleura: Pulmonary nodules measure up to 6 mm in the right middle lobe (4/116), as on 05/12/2021. Patchy peribronchovascular nodularity and consolidation in the right lower lobe, new. No pleural fluid. Airway is unremarkable. Musculoskeletal: Degenerative changes in the spine.  No fracture. CT ABDOMEN PELVIS FINDINGS Hepatobiliary: Liver margin is mildly irregular. Cholecystectomy. No biliary ductal dilatation. Pancreas: Negative. Spleen: Negative. Adrenals/Urinary Tract: Adrenal glands are unremarkable. Parenchymal thinning and scarring in the kidneys. Subcentimeter lesions in the left kidney, too small to characterize. No specific follow-up necessary. Ureters are decompressed. Bladder is grossly unremarkable. Stomach/Bowel: Stomach, small bowel and colon are unremarkable. Appendix is not readily visualized. Vascular/Lymphatic: Atherosclerotic calcification of the aorta. No pathologically enlarged lymph nodes. Reproductive: Hysterectomy.  No adnexal mass. Other: No free fluid.  Mesenteries and peritoneum are unremarkable. Musculoskeletal: Degenerative changes in the spine. No worrisome lytic or sclerotic lesions. IMPRESSION: 1. No evidence of acute trauma to the chest, abdomen or pelvis. 2. New peribronchovascular nodularity and consolidation in the right lower lobe, indicative of bronchopneumonia or aspiration. Difficult to definitively exclude pulmonary contusion. 3. Mild marginal regulated the liver raises suspicion for cirrhosis. 4.  Aortic atherosclerosis (ICD10-I70.0). Electronically  Signed   By: Lorin Picket M.D.   On: 10/02/2021 15:21   CT T-SPINE NO CHARGE  Result Date: 10/02/2021 CLINICAL DATA:  Polytrauma, blunt; Low back pain, trauma; Mid-back pain. EXAM: CT CERVICAL, THORACIC, AND LUMBAR SPINE WITHOUT CONTRAST TECHNIQUE: Multidetector CT imaging of the cervical, thoracic and lumbar spine was performed without  intravenous contrast. Multiplanar CT image reconstructions were also generated. RADIATION DOSE REDUCTION: This exam was performed according to the departmental dose-optimization program which includes automated exposure control, adjustment of the mA and/or kV according to patient size and/or use of iterative reconstruction technique. COMPARISON:  CT angiography chest 05/12/2021, CT cervical spine 04/05/2020 FINDINGS: CT CERVICAL SPINE FINDINGS Alignment: Normal. Skull base and vertebrae: Multilevel degenerative changes spine most prominent at the C4-C5 level. Associated severe right osseous neural foraminal stenosis at the C4-C5 level. No severe osseous central canal stenosis. No acute fracture. No aggressive appearing focal osseous lesion or focal pathologic process. Soft tissues and spinal canal: No prevertebral fluid or swelling. No visible canal hematoma. Upper chest: Biapical pleural/pulmonary scarring. Other: None. CT THORACIC SPINE FINDINGS Alignment: Normal. Vertebrae: No acute fracture or focal pathologic process. Paraspinal and other soft tissues: Negative. Disc levels: Multilevel neural intervertebral disc space vacuum phenomenon. Otherwise maintained. CT LUMBAR SPINE FINDINGS Segmentation: 5 lumbar type vertebrae. Alignment: Levocurvature of the lumbar spine centered at the L2-L3 level may be positional. Vertebrae: Multilevel osteophyte formation and facet arthropathy. Multilevel posterior disc osteophyte complex. No severe osseous neural foraminal or central canal stenosis. No acute fracture or focal pathologic process. Paraspinal and other soft tissues: Negative. Disc levels: Multilevel intervertebral disc space narrowing and vacuum phenomenon. Other: Atherosclerotic plaque. IMPRESSION: 1. No acute displaced fracture or traumatic listhesis of the cervical spine. 2. No acute displaced fracture or traumatic listhesis of the thoracic spine. 3. No acute displaced fracture or traumatic listhesis of the  lumbar spine. 4. Severe osseous neural foraminal stenosis at the right C4-C5 level. 5. Please see separately dictated CT head, chest, abdomen, pelvis 10/02/2021. 6.  Aortic Atherosclerosis (ICD10-I70.0). Electronically Signed   By: Iven Finn M.D.   On: 10/02/2021 15:20   DG Chest Port 1 View  Result Date: 10/02/2021 CLINICAL DATA:  Fall EXAM: PORTABLE CHEST 1 VIEW COMPARISON:  05/12/2021 FINDINGS: Chronic interstitial changes. No new consolidation or edema. No pleural effusion or pneumothorax. Stable cardiomegaly. Left chest wall dual lead pacemaker. Included osseous structures are grossly intact. IMPRESSION: No acute process in the chest. Electronically Signed   By: Macy Mis M.D.   On: 10/02/2021 15:06   CT Maxillofacial Wo Contrast  Result Date: 10/02/2021 CLINICAL DATA:  Head trauma, moderate-severe EXAM: CT HEAD WITHOUT CONTRAST CT MAXILLOFACIAL WITHOUT CONTRAST TECHNIQUE: Multidetector CT imaging of the head and maxillofacial structures were performed using the standard protocol without intravenous contrast. Multiplanar CT image reconstructions of the maxillofacial structures were also generated. RADIATION DOSE REDUCTION: This exam was performed according to the departmental dose-optimization program which includes automated exposure control, adjustment of the mA and/or kV according to patient size and/or use of iterative reconstruction technique. COMPARISON:  December 2021 FINDINGS: CT HEAD FINDINGS Brain: No acute intracranial hemorrhage, mass effect or edema. Gray-white differentiation is preserved. Prominence of the ventricles and sulci reflects similar parenchymal volume loss. Minimal patchy hypoattenuation in the supratentorial white matter is nonspecific but may reflect similar minor chronic microvascular ischemic changes. No extra-axial collection. Vascular: There is intracranial atherosclerotic calcification at the skull base. Skull: Unremarkable. Other: Left frontal scalp hematoma.  CT MAXILLOFACIAL FINDINGS Osseous:  No acute facial fracture. Degenerative changes at the left temporomandibular joint. Orbits: No intraorbital hematoma. Sinuses: Mild mucosal thickening. Soft tissues: Left frontal scalp and periorbital soft tissue swelling with hematoma and laceration. IMPRESSION: No evidence of acute intracranial injury.  No acute facial fracture. Electronically Signed   By: Macy Mis M.D.   On: 10/02/2021 14:48    Procedures .Marland KitchenLaceration Repair  Date/Time: 10/02/2021 4:06 PM Performed by: Evlyn Courier, PA-C Authorized by: Evlyn Courier, PA-C   Consent:    Consent obtained:  Verbal   Consent given by:  Patient   Risks discussed:  Infection, need for additional repair, pain, poor cosmetic result and poor wound healing   Alternatives discussed:  No treatment and delayed treatment Universal protocol:    Procedure explained and questions answered to patient or proxy's satisfaction: yes     Relevant documents present and verified: yes     Test results available: yes     Imaging studies available: yes     Required blood products, implants, devices, and special equipment available: yes     Site/side marked: yes     Immediately prior to procedure, a time out was called: yes     Patient identity confirmed:  Verbally with patient Anesthesia:    Anesthesia method:  Local infiltration   Local anesthetic:  Lidocaine 2% WITH epi Laceration details:    Location:  Face   Face location:  Forehead   Length (cm):  3 Pre-procedure details:    Preparation:  Patient was prepped and draped in usual sterile fashion Exploration:    Imaging outcome: foreign body not noted   Treatment:    Area cleansed with:  Povidone-iodine   Amount of cleaning:  Standard   Debridement:  None   Undermining:  None Skin repair:    Repair method:  Sutures   Suture size:  5-0   Suture material:  Prolene   Suture technique:  Running   Number of sutures:  4 Approximation:    Approximation:   Close Repair type:    Repair type:  Simple Post-procedure details:    Dressing:  Open (no dressing)   Procedure completion:  Tolerated well, no immediate complications .Marland KitchenLaceration Repair  Date/Time: 10/02/2021 4:07 PM Performed by: Evlyn Courier, PA-C Authorized by: Evlyn Courier, PA-C   Consent:    Consent obtained:  Verbal   Consent given by:  Patient   Risks discussed:  Infection, need for additional repair, pain, poor cosmetic result and poor wound healing   Alternatives discussed:  No treatment and delayed treatment Universal protocol:    Procedure explained and questions answered to patient or proxy's satisfaction: yes     Relevant documents present and verified: yes     Test results available: yes     Imaging studies available: yes     Required blood products, implants, devices, and special equipment available: yes     Site/side marked: yes     Immediately prior to procedure, a time out was called: yes     Patient identity confirmed:  Verbally with patient Anesthesia:    Anesthesia method:  Local infiltration   Local anesthetic:  Lidocaine 2% WITH epi Laceration details:    Location:  Face   Face location:  Forehead   Length (cm):  2 Pre-procedure details:    Preparation:  Patient was prepped and draped in usual sterile fashion Treatment:    Area cleansed with:  Povidone-iodine   Amount of cleaning:  Standard   Irrigation solution:  Sterile saline   Debridement:  None   Undermining:  None Skin repair:    Repair method:  Sutures   Suture size:  5-0   Suture material:  Prolene   Number of sutures:  2 Approximation:    Approximation:  Close Repair type:    Repair type:  Simple Post-procedure details:    Dressing:  Open (no dressing)   Procedure completion:  Tolerated well, no immediate complications .Marland KitchenLaceration Repair  Date/Time: 10/02/2021 4:08 PM Performed by: Evlyn Courier, PA-C Authorized by: Evlyn Courier, PA-C   Consent:    Consent obtained:  Verbal   Consent  given by:  Patient   Risks discussed:  Infection, need for additional repair, pain, poor cosmetic result and poor wound healing   Alternatives discussed:  No treatment and delayed treatment Universal protocol:    Procedure explained and questions answered to patient or proxy's satisfaction: yes     Relevant documents present and verified: yes     Test results available: yes     Imaging studies available: yes     Required blood products, implants, devices, and special equipment available: yes     Site/side marked: yes     Immediately prior to procedure, a time out was called: yes     Patient identity confirmed:  Verbally with patient Anesthesia:    Anesthesia method:  Local infiltration   Local anesthetic:  Lidocaine 2% WITH epi Laceration details:    Length (cm):  1 Pre-procedure details:    Preparation:  Patient was prepped and draped in usual sterile fashion Treatment:    Area cleansed with:  Povidone-iodine   Amount of cleaning:  Standard   Irrigation solution:  Sterile saline   Debridement:  None   Undermining:  None Skin repair:    Repair method:  Sutures   Suture size:  5-0   Suture material:  Prolene   Suture technique:  Simple interrupted   Number of sutures:  1    Medications Ordered in ED Medications  lidocaine-EPINEPHrine (XYLOCAINE W/EPI) 2 %-1:200000 (PF) injection 20 mL (has no administration in time range)  Tdap (BOOSTRIX) injection 0.5 mL (0.5 mLs Intramuscular Given 10/02/21 1420)  iohexol (OMNIPAQUE) 300 MG/ML solution 85 mL (85 mLs Intravenous Contrast Given 10/02/21 1509)    ED Course/ Medical Decision Making/ A&P                           Medical Decision Making Amount and/or Complexity of Data Reviewed Labs: ordered. Radiology: ordered. ECG/medicine tests: ordered.  Risk Prescription drug management.   88 year old female presents today for evaluation of follow-up extremities as a level 2 trauma.  3 lacerations noted to left side of forehead.   Measuring 3 cm, 2 cm, 1 cm.  These were repaired with sutures.  Patient had no prodromal symptoms prior to her syncope.  Given her age and mechanism she is high risk syncope and will require admission.  At the end of my shift patient CT head, CT maxillofacial, hip x-ray, chest x-ray are without acute concerns.  She was still awaiting chest, abdomen, pelvis CT scan, CT cervical spine, CT thoracic and lumbar spine.  Patient has been signed out to oncoming provider.  Following these imaging patient will benefit from admission for high risk syncope.  Patient signed out to oncoming provider.   Final Clinical Impression(s) / ED Diagnoses Final diagnoses:  None    Rx / DC Orders ED Discharge Orders  None         Evlyn Courier, PA-C 10/02/21 1606    Evlyn Courier, PA-C 10/02/21 1609    Charlesetta Shanks, MD 10/10/21 1746

## 2021-10-02 NOTE — H&P (Signed)
History and Physical  Misty Blackwell GNF:[id number] DOB: [birth year] DOA: 10/02/2021  Referring physician: Lita Blackwell  PCP: Misty Pinto, MD  Outpatient Specialists: Cardiology. Patient coming from: Home/store through EMS  Chief Complaint: Syncope  HPI: Misty Blackwell is a 88 y.o. female with medical history significant for permanent A-fib on Coumadin, sick sinus syndrome status post pacemaker placement, iron deficiency anemia, hypertension, who presented to [facility] ED via EMS after a syncopal episode while walking out of the store.  No prodromal symptoms.  She was in her usual state of health prior to this.  All she remembers was waking up in the ambulance.  No chest pain.  No palpitations. No prior history of syncope.  She incurred some facial injuries.  Unclear how long she was unconscious.  EMS was activated.  No seizure-like activity reported.  No tongue biting.  No urinary or bowel incontinence.    Upon presentation to the ED, her pacemaker was interrogated and it was unrevealing.  She was noted to have mildly elevated temperature 99.  CT chest abdomen and pelvis revealed concern for possible right-sided pneumonia versus aspiration.  Trauma scans did not show evidence of acute fracture.  She does have a laceration over the left forehead that was repaired in the ED.  Initial high-sensitivity troponin was negative.  Due to concern for community-acquired pneumonia the patient was started on IV antibiotics empirically IV azithromycin and Rocephin.  EDP requested admission for further evaluation, syncope work-up and treatment of presumed community-acquired pneumonia.  The patient was admitted by hospitalist service, [facility].  ED Course: Tmax 99.  BP 154/60, pulse 76, respiratory 28, saturation 94% on room air.  CMP essentially unremarkable except for mildly elevated serum glucose 121.  CBC remarkable for WBC 16.5.  UA negative for pyuria.  Review of Systems: Review of systems as noted in  the HPI. All other systems reviewed and are negative.   Past Medical History:  Diagnosis Date   Anxiety    Atrial fib/flutter, transient    Atrial fibrillation, chronic (HCC)    CHF (congestive heart failure) ([place]) 10/29/2009   Echo - EF >55%; normal LV size and systolic function; unable to assess diastolic fcn due to E/A fusion, pulmonary vein flow pattern suggests elevated filling pressure; marked biatrail dilation, mild/mod tricuspid regurgitation; mod pulmonary htn; mild/mod mitral regurgitation; although echocardiographic features are incomplete findings suggest possible infiltrative cardiomyopathy (maybe amyloidosi   Coronary artery disease 03/19/2002   R/P Cardiolite - EF 76%; nromal static and dynamic myocardial perfusion images; normal wall motion and endocardial thickening in all vascular territories   Dyspnea    Dysrhythmia    Facial numbness 12/26/2008   carotid doppler - R and L ICAs 0-49% diameter reduction (velocities suggest low end of scale)   Hypertension    Pacemaker    Peripheral neuropathy    Pneumonia 2017   Skin cancer    s/p surgical removal.   Stroke ([place]) 04/2017   TIA (transient ischemic attack)    Past Surgical History:  Procedure Laterality Date   ABDOMINAL HYSTERECTOMY     APPENDECTOMY     BREAST LUMPECTOMY WITH RADIOACTIVE SEED LOCALIZATION Right 12/13/2019   Procedure: RIGHT BREAST LUMPECTOMY WITH RADIOACTIVE SEED LOCALIZATION;  Surgeon: Donnie Mesa, MD;  Location: [place];  Service: General;  Laterality: Right;  LMA VS MAC   BREAST SURGERY Left 1949   CARDIAC CATHETERIZATION  08/06/2005   minimal coronary disease predominant RCA; no significant atherosclerosis; new onset sick sinus syndrome and  atrial flutter w/ ventricular response, controlled on med therapy; systemic HTN, normal renal arteries   CARDIOVERSION  11/19/2009   successful DCCV from AF to sinus type rhythm   CHOLECYSTECTOMY     EYE SURGERY Bilateral 2013   PPM GENERATOR CHANGEOUT N/A  08/24/2021   Procedure: [place];  Surgeon: Sanda Klein, MD;  Location: [place] [facility];  Service: Cardiovascular;  Laterality: N/A;   Skin cancer resection     TONSILLECTOMY  1938    Social History:  reports that she has never smoked. She has never used smokeless tobacco. She reports that she does not drink alcohol and does not use drugs.   Allergies  Allergen Reactions   Latex Itching   Ace Inhibitors Other (See Comments)    Unknown reaction   Augmentin [Amoxicillin-Pot Clavulanate] Other (See Comments)    Unknown   Ciprofloxacin Other (See Comments)    Unknown    Levaquin [Levofloxacin In D5w] Other (See Comments)    Unknown   Zocor [Simvastatin] Other (See Comments)    Unknown    Acrylic Polymer [Carbomer] Itching   Chocolate Other (See Comments)    Migraines    Gabapentin Other (See Comments)    Unsteady gait     Family History  Problem Relation Age of Onset   Heart disease Mother    Diabetes Mother    Heart attack Father    Heart disease Father    Cirrhosis Brother       Prior to Admission medications   Medication Sig Start Date End Date Taking? Authorizing Provider  acetaminophen (TYLENOL) 325 MG tablet Take 325-650 mg by mouth every 6 (six) hours as needed for mild pain or headache.   Yes [provider]  acidophilus (RISAQUAD) CAPS capsule Take 1 capsule by mouth daily.   Yes [provider]  anastrozole (ARIMIDEX) 1 MG tablet TAKE 1 TABLET(1 MG) BY MOUTH DAILY Patient taking differently: Take 1 mg by mouth daily. 07/02/21  Yes Misty Lose, MD  Ascorbic Acid (VITAMIN C) 1000 MG tablet Take 1,000 mg by mouth daily.    Yes [provider]  Cholecalciferol (VITAMIN D-3) 125 MCG (5000 UT) TABS Take 5,000 Units by mouth daily.   Yes [provider]  clotrimazole-betamethasone (LOTRISONE) cream APPLY TOPICALLY TO THE AFFECTED AREA TWICE DAILY Patient taking differently: Apply 1 application. topically 2  (two) times daily as needed (to affected area). 08/19/21  Yes Misty Pinto, MD  Cyanocobalamin (VITAMIN B 12 PO) Take 1,000 mcg by mouth daily.    Yes [provider]  diltiazem (CARDIZEM CD) 240 MG 24 hr capsule Take 1 capsule (240 mg total) by mouth daily. 10/02/21  Yes [name], Mihai, MD  Ferrous Sulfate 90 (18 Fe) MG TABS Take 18 mg by mouth daily with breakfast.   Yes [provider]  furosemide (LASIX) 20 MG tablet Take 2 tablets (40 mg total) by mouth daily. Patient taking differently: Take 20 mg by mouth in the morning and at bedtime. 05/18/21  Yes [name], Mihai, MD  hydrOXYzine (ATARAX) 50 MG tablet TAKE 1 TO 2 TABLETS BY MOUTH EVERY NIGHT 1 HOUR BEFORE BEDTIME AS NEEDED FOR SLEEP Patient taking differently: Take 100 mg by mouth at bedtime. 09/02/21  Yes Liane Comber, NP  irbesartan (AVAPRO) 300 MG tablet TAKE 1 TABLET(300 MG) BY MOUTH DAILY Patient taking differently: Take 300 mg by mouth daily. 09/02/21  Yes Liane Comber, NP  Magnesium 250 MG TABS Take 250 mg by mouth daily.  Yes [provider]  OVER THE COUNTER MEDICATION Apply 1 application. topically See admin instructions. Neuropathy maximum strength Nerve relief and recovery cream- Apply to the feet at bedtime   Yes [provider]  potassium chloride SA (KLOR-CON M) 20 MEQ tablet Take  1 tablet  2 x /day  for Potassium Patient taking differently: Take 20 mEq by mouth in the morning and at bedtime. 08/03/21  Yes Misty Pinto, MD  warfarin (COUMADIN) 3 MG tablet TAKE 1 TO 1 AND 1/2 TABLET BY MOUTH AS DIRECTED 10/02/21  Yes Magda Bernheim, NP  zinc gluconate 50 MG tablet Take 50 mg by mouth daily.   Yes [provider]  pregabalin (LYRICA) 25 MG capsule Take 1 capsule (25 mg total) by mouth 2 (two) times daily. Patient not taking: Reported on 10/02/2021 06/15/21   Misty Pinto, MD    Physical Exam: BP (!) 154/60   Pulse 76   Temp 99 F (37.2 C) (Oral)   Resp (!) 28   Ht _0   (1.626 m)   Wt 65.3 kg   SpO2 94%   BMI 24.72 kg/m   General: 88 y.o. year-old female well developed well nourished in no acute distress.  Alert and oriented x3.  Moderate left facial bruising with dry blood on her scalp/hair. Cardiovascular: Regular rate and rhythm with no rubs or gallops.  No thyromegaly or JVD noted.  No lower extremity edema. 2/4 pulses in all 4 extremities. Respiratory: Clear to auscultation with no wheezes or rales. Good inspiratory effort. Abdomen: Soft nontender nondistended with normal bowel sounds x4 quadrants. Muskuloskeletal: No cyanosis, clubbing or edema noted bilaterally Neuro: CN II-XII intact, strength, sensation, reflexes Skin: No ulcerative lesions noted or rashes Psychiatry: Judgement and insight appear normal. Mood is appropriate for condition and setting          Labs on Admission:  Basic Metabolic Panel: Recent Labs  Lab 10/02/21 1356 10/02/21 1430  NA 140 139  K 4.4 4.3  CL 105 103  CO2 25  --   GLUCOSE 121* 118*  BUN 20 24*  CREATININE 0.85 0.80  CALCIUM 9.2  --    Liver Function Tests: Recent Labs  Lab 10/02/21 1356  AST 20  ALT 15  ALKPHOS 106  BILITOT 1.1  PROT 7.2  ALBUMIN 3.8   No results for input(s): LIPASE, AMYLASE in the last 168 hours. No results for input(s): AMMONIA in the last 168 hours. CBC: Recent Labs  Lab 10/02/21 1356 10/02/21 1430  WBC 16.5*  --   HGB 13.1 13.9  HCT 41.3 41.0  MCV 93.4  --   PLT 186  --    Cardiac Enzymes: No results for input(s): CKTOTAL, CKMB, CKMBINDEX, TROPONINI in the last 168 hours.  BNP (last 3 results) Recent Labs    05/12/21 1855 05/18/21 0923  BNP 167.7* 93.7    ProBNP (last 3 results) No results for input(s): PROBNP in the last 8760 hours.  CBG: No results for input(s): GLUCAP in the last 168 hours.  Radiological Exams on Admission: CT Head Wo Contrast  Result Date: 10/02/2021 CLINICAL DATA:  Head trauma, moderate-severe EXAM: CT HEAD WITHOUT CONTRAST CT  MAXILLOFACIAL WITHOUT CONTRAST TECHNIQUE: Multidetector CT imaging of the head and maxillofacial structures were performed using the standard protocol without intravenous contrast. Multiplanar CT image reconstructions of the maxillofacial structures were also generated. RADIATION DOSE REDUCTION: This exam was performed according to the departmental dose-optimization program which includes automated exposure control, adjustment of  the mA and/or kV according to patient size and/or use of iterative reconstruction technique. COMPARISON:  December 2021 FINDINGS: CT HEAD FINDINGS Brain: No acute intracranial hemorrhage, mass effect or edema. Gray-white differentiation is preserved. Prominence of the ventricles and sulci reflects similar parenchymal volume loss. Minimal patchy hypoattenuation in the supratentorial white matter is nonspecific but may reflect similar minor chronic microvascular ischemic changes. No extra-axial collection. Vascular: There is intracranial atherosclerotic calcification at the skull base. Skull: Unremarkable. Other: Left frontal scalp hematoma. CT MAXILLOFACIAL FINDINGS Osseous: No acute facial fracture. Degenerative changes at the left temporomandibular joint. Orbits: No intraorbital hematoma. Sinuses: Mild mucosal thickening. Soft tissues: Left frontal scalp and periorbital soft tissue swelling with hematoma and laceration. IMPRESSION: No evidence of acute intracranial injury.  No acute facial fracture. Electronically Signed   By: Macy Mis M.D.   On: 10/02/2021 14:48   CT Cervical Spine Wo Contrast  Result Date: 10/02/2021 CLINICAL DATA:  Polytrauma, blunt; Low back pain, trauma; Mid-back pain. EXAM: CT CERVICAL, THORACIC, AND LUMBAR SPINE WITHOUT CONTRAST TECHNIQUE: Multidetector CT imaging of the cervical, thoracic and lumbar spine was performed without intravenous contrast. Multiplanar CT image reconstructions were also generated. RADIATION DOSE REDUCTION: This exam was performed  according to the departmental dose-optimization program which includes automated exposure control, adjustment of the mA and/or kV according to patient size and/or use of iterative reconstruction technique. COMPARISON:  CT angiography chest 05/12/2021, CT cervical spine 04/05/2020 FINDINGS: CT CERVICAL SPINE FINDINGS Alignment: Normal. Skull base and vertebrae: Multilevel degenerative changes spine most prominent at the C4-C5 level. Associated severe right osseous neural foraminal stenosis at the C4-C5 level. No severe osseous central canal stenosis. No acute fracture. No aggressive appearing focal osseous lesion or focal pathologic process. Soft tissues and spinal canal: No prevertebral fluid or swelling. No visible canal hematoma. Upper chest: Biapical pleural/pulmonary scarring. Other: None. CT THORACIC SPINE FINDINGS Alignment: Normal. Vertebrae: No acute fracture or focal pathologic process. Paraspinal and other soft tissues: Negative. Disc levels: Multilevel neural intervertebral disc space vacuum phenomenon. Otherwise maintained. CT LUMBAR SPINE FINDINGS Segmentation: 5 lumbar type vertebrae. Alignment: Levocurvature of the lumbar spine centered at the L2-L3 level may be positional. Vertebrae: Multilevel osteophyte formation and facet arthropathy. Multilevel posterior disc osteophyte complex. No severe osseous neural foraminal or central canal stenosis. No acute fracture or focal pathologic process. Paraspinal and other soft tissues: Negative. Disc levels: Multilevel intervertebral disc space narrowing and vacuum phenomenon. Other: Atherosclerotic plaque. IMPRESSION: 1. No acute displaced fracture or traumatic listhesis of the cervical spine. 2. No acute displaced fracture or traumatic listhesis of the thoracic spine. 3. No acute displaced fracture or traumatic listhesis of the lumbar spine. 4. Severe osseous neural foraminal stenosis at the right C4-C5 level. 5. Please see separately dictated CT head, chest,  abdomen, pelvis 10/02/2021. 6.  Aortic Atherosclerosis (ICD10-I70.0). Electronically Signed   By: Iven Finn M.D.   On: 10/02/2021 15:20   CT Lumbar Spine Wo Contrast  Result Date: 10/02/2021 CLINICAL DATA:  Polytrauma, blunt; Low back pain, trauma; Mid-back pain. EXAM: CT CERVICAL, THORACIC, AND LUMBAR SPINE WITHOUT CONTRAST TECHNIQUE: Multidetector CT imaging of the cervical, thoracic and lumbar spine was performed without intravenous contrast. Multiplanar CT image reconstructions were also generated. RADIATION DOSE REDUCTION: This exam was performed according to the departmental dose-optimization program which includes automated exposure control, adjustment of the mA and/or kV according to patient size and/or use of iterative reconstruction technique. COMPARISON:  CT angiography chest 05/12/2021, CT cervical spine 04/05/2020 FINDINGS: CT CERVICAL  SPINE FINDINGS Alignment: Normal. Skull base and vertebrae: Multilevel degenerative changes spine most prominent at the C4-C5 level. Associated severe right osseous neural foraminal stenosis at the C4-C5 level. No severe osseous central canal stenosis. No acute fracture. No aggressive appearing focal osseous lesion or focal pathologic process. Soft tissues and spinal canal: No prevertebral fluid or swelling. No visible canal hematoma. Upper chest: Biapical pleural/pulmonary scarring. Other: None. CT THORACIC SPINE FINDINGS Alignment: Normal. Vertebrae: No acute fracture or focal pathologic process. Paraspinal and other soft tissues: Negative. Disc levels: Multilevel neural intervertebral disc space vacuum phenomenon. Otherwise maintained. CT LUMBAR SPINE FINDINGS Segmentation: 5 lumbar type vertebrae. Alignment: Levocurvature of the lumbar spine centered at the L2-L3 level may be positional. Vertebrae: Multilevel osteophyte formation and facet arthropathy. Multilevel posterior disc osteophyte complex. No severe osseous neural foraminal or central canal stenosis.  No acute fracture or focal pathologic process. Paraspinal and other soft tissues: Negative. Disc levels: Multilevel intervertebral disc space narrowing and vacuum phenomenon. Other: Atherosclerotic plaque. IMPRESSION: 1. No acute displaced fracture or traumatic listhesis of the cervical spine. 2. No acute displaced fracture or traumatic listhesis of the thoracic spine. 3. No acute displaced fracture or traumatic listhesis of the lumbar spine. 4. Severe osseous neural foraminal stenosis at the right C4-C5 level. 5. Please see separately dictated CT head, chest, abdomen, pelvis 10/02/2021. 6.  Aortic Atherosclerosis (ICD10-I70.0). Electronically Signed   By: Iven Finn M.D.   On: 10/02/2021 15:20   DG Pelvis Portable  Result Date: 10/02/2021 CLINICAL DATA:  Fall, level 2 trauma. EXAM: PORTABLE PELVIS 1-2 VIEWS COMPARISON:  None Available. FINDINGS: There is no evidence of pelvic fracture or diastasis. No pelvic bone lesions are seen. IMPRESSION: No acute osseous abnormality. Electronically Signed   By: Dahlia Bailiff M.D.   On: 10/02/2021 14:25   CT CHEST ABDOMEN PELVIS W CONTRAST  Result Date: 10/02/2021 CLINICAL DATA:  Fall with chest pain and abdominal pain, on Coumadin. EXAM: CT CHEST, ABDOMEN, AND PELVIS WITH CONTRAST TECHNIQUE: Multidetector CT imaging of the chest, abdomen and pelvis was performed following the standard protocol during bolus administration of intravenous contrast. RADIATION DOSE REDUCTION: This exam was performed according to the departmental dose-optimization program which includes automated exposure control, adjustment of the mA and/or kV according to patient size and/or use of iterative reconstruction technique. CONTRAST:  87m OMNIPAQUE IOHEXOL 300 MG/ML  SOLN COMPARISON:  CT chest 05/12/2021. FINDINGS: CT CHEST FINDINGS Cardiovascular: Atherosclerotic calcification of the aorta and coronary arteries. Enlarged pulmonic trunk and heart. No pericardial effusion. Mediastinum/Nodes:  1.8 cm minimally hypodense nodule in the right thyroid. No follow-up recommended unless clinically warranted. (Ref: J Am Coll Radiol. 2015 Feb;12(2): 143-50). Mediastinal lymph nodes are not enlarged by CT size criteria. No hilar or axillary adenopathy. Esophagus is grossly unremarkable. Lungs/Pleura: Pulmonary nodules measure up to 6 mm in the right middle lobe (4/116), as on 05/12/2021. Patchy peribronchovascular nodularity and consolidation in the right lower lobe, new. No pleural fluid. Airway is unremarkable. Musculoskeletal: Degenerative changes in the spine.  No fracture. CT ABDOMEN PELVIS FINDINGS Hepatobiliary: Liver margin is mildly irregular. Cholecystectomy. No biliary ductal dilatation. Pancreas: Negative. Spleen: Negative. Adrenals/Urinary Tract: Adrenal glands are unremarkable. Parenchymal thinning and scarring in the kidneys. Subcentimeter lesions in the left kidney, too small to characterize. No specific follow-up necessary. Ureters are decompressed. Bladder is grossly unremarkable. Stomach/Bowel: Stomach, small bowel and colon are unremarkable. Appendix is not readily visualized. Vascular/Lymphatic: Atherosclerotic calcification of the aorta. No pathologically enlarged lymph nodes. Reproductive: Hysterectomy.  No  adnexal mass. Other: No free fluid.  Mesenteries and peritoneum are unremarkable. Musculoskeletal: Degenerative changes in the spine. No worrisome lytic or sclerotic lesions. IMPRESSION: 1. No evidence of acute trauma to the chest, abdomen or pelvis. 2. New peribronchovascular nodularity and consolidation in the right lower lobe, indicative of bronchopneumonia or aspiration. Difficult to definitively exclude pulmonary contusion. 3. Mild marginal regulated the liver raises suspicion for cirrhosis. 4.  Aortic atherosclerosis (ICD10-I70.0). Electronically Signed   By: Lorin Picket M.D.   On: 10/02/2021 15:21   CT T-SPINE NO CHARGE  Result Date: 10/02/2021 CLINICAL DATA:  Polytrauma,  blunt; Low back pain, trauma; Mid-back pain. EXAM: CT CERVICAL, THORACIC, AND LUMBAR SPINE WITHOUT CONTRAST TECHNIQUE: Multidetector CT imaging of the cervical, thoracic and lumbar spine was performed without intravenous contrast. Multiplanar CT image reconstructions were also generated. RADIATION DOSE REDUCTION: This exam was performed according to the departmental dose-optimization program which includes automated exposure control, adjustment of the mA and/or kV according to patient size and/or use of iterative reconstruction technique. COMPARISON:  CT angiography chest 05/12/2021, CT cervical spine 04/05/2020 FINDINGS: CT CERVICAL SPINE FINDINGS Alignment: Normal. Skull base and vertebrae: Multilevel degenerative changes spine most prominent at the C4-C5 level. Associated severe right osseous neural foraminal stenosis at the C4-C5 level. No severe osseous central canal stenosis. No acute fracture. No aggressive appearing focal osseous lesion or focal pathologic process. Soft tissues and spinal canal: No prevertebral fluid or swelling. No visible canal hematoma. Upper chest: Biapical pleural/pulmonary scarring. Other: None. CT THORACIC SPINE FINDINGS Alignment: Normal. Vertebrae: No acute fracture or focal pathologic process. Paraspinal and other soft tissues: Negative. Disc levels: Multilevel neural intervertebral disc space vacuum phenomenon. Otherwise maintained. CT LUMBAR SPINE FINDINGS Segmentation: 5 lumbar type vertebrae. Alignment: Levocurvature of the lumbar spine centered at the L2-L3 level may be positional. Vertebrae: Multilevel osteophyte formation and facet arthropathy. Multilevel posterior disc osteophyte complex. No severe osseous neural foraminal or central canal stenosis. No acute fracture or focal pathologic process. Paraspinal and other soft tissues: Negative. Disc levels: Multilevel intervertebral disc space narrowing and vacuum phenomenon. Other: Atherosclerotic plaque. IMPRESSION: 1. No  acute displaced fracture or traumatic listhesis of the cervical spine. 2. No acute displaced fracture or traumatic listhesis of the thoracic spine. 3. No acute displaced fracture or traumatic listhesis of the lumbar spine. 4. Severe osseous neural foraminal stenosis at the right C4-C5 level. 5. Please see separately dictated CT head, chest, abdomen, pelvis 10/02/2021. 6.  Aortic Atherosclerosis (ICD10-I70.0). Electronically Signed   By: Iven Finn M.D.   On: 10/02/2021 15:20   DG Chest Port 1 View  Result Date: 10/02/2021 CLINICAL DATA:  Fall EXAM: PORTABLE CHEST 1 VIEW COMPARISON:  05/12/2021 FINDINGS: Chronic interstitial changes. No new consolidation or edema. No pleural effusion or pneumothorax. Stable cardiomegaly. Left chest wall dual lead pacemaker. Included osseous structures are grossly intact. IMPRESSION: No acute process in the chest. Electronically Signed   By: Macy Mis M.D.   On: 10/02/2021 15:06   CT Maxillofacial Wo Contrast  Result Date: 10/02/2021 CLINICAL DATA:  Head trauma, moderate-severe EXAM: CT HEAD WITHOUT CONTRAST CT MAXILLOFACIAL WITHOUT CONTRAST TECHNIQUE: Multidetector CT imaging of the head and maxillofacial structures were performed using the standard protocol without intravenous contrast. Multiplanar CT image reconstructions of the maxillofacial structures were also generated. RADIATION DOSE REDUCTION: This exam was performed according to the departmental dose-optimization program which includes automated exposure control, adjustment of the mA and/or kV according to patient size and/or use of iterative reconstruction technique. COMPARISON:  December 2021 FINDINGS: CT HEAD FINDINGS Brain: No acute intracranial hemorrhage, mass effect or edema. Gray-white differentiation is preserved. Prominence of the ventricles and sulci reflects similar parenchymal volume loss. Minimal patchy hypoattenuation in the supratentorial white matter is nonspecific but may reflect similar  minor chronic microvascular ischemic changes. No extra-axial collection. Vascular: There is intracranial atherosclerotic calcification at the skull base. Skull: Unremarkable. Other: Left frontal scalp hematoma. CT MAXILLOFACIAL FINDINGS Osseous: No acute facial fracture. Degenerative changes at the left temporomandibular joint. Orbits: No intraorbital hematoma. Sinuses: Mild mucosal thickening. Soft tissues: Left frontal scalp and periorbital soft tissue swelling with hematoma and laceration. IMPRESSION: No evidence of acute intracranial injury.  No acute facial fracture. Electronically Signed   By: Macy Mis M.D.   On: 10/02/2021 14:48    EKG: I independently viewed the EKG done and my findings are as followed: Atrial fibrillation rate of 87.  Nonspecific ST-T changes.  QTc 420.  Assessment/Plan Present on Admission:  Syncope  Principal Problem:   Syncope  Syncope, unclear etiology, rule out cardiogenic cause No prodrome.  No seizure like activity.  Significant left facial injury Pacemaker interrogated in the ED, unrevealing. Syncope work-up Head CT was negative for any acute intracranial findings Obtain orthostatic vital signs daily today and tomorrow She had a recent 2D echo on 05/13/2021, defer to cardiology to repeat 2D echo May need EP evaluation Gentle IV fluid hydration PT OT to assess in the AM Fall precautions Consult cardiology in the morning  Left facial injury post fall from syncope Post repair in the ED Local wound care Analgesics PRN  Right lower lobe pneumonia, CAP, POA IV azithromycin and Rocephin initiated in the ED, continue Obtain procalcitonin level in the morning Monitor fever curve and WBC Initial WBC 16,000 Antitussives as needed, bronchodilators  Permanent A-fib on Coumadin Subtherapeutic INR 1.7 Pharmacy consulted to assist with management of Coumadin dosing  Subtherapeutic INR on Coumadin Goal INR between 2 and 3 Presented with INR  1.7 Coumadin dosing per pharmacy Will not bridge for now with SQ Lovenox due to concern for re-bleeding in the setting of recent trauma to the head  Sick sinus syndrome status post pacemaker placement Pacemaker was interrogated in the ED, was unrevealing  HFpEF 60 to 65% Last 2D echo done on 05/13/2021 showed LVEF 60 to 65% with grade 2 diastolic dysfunction. Euvolemic on exam Closely monitor volume status while on IV fluid hydration LR at 50 cc/h x 1 day.  History of breast cancer Resume home anastrozole  Iron deficiency anemia Hemoglobin stable Resume home iron supplement   Critical care time: 65 minutes.   DVT prophylaxis: Coumadin  Code Status: Full code  Family Communication: None at bedside  Disposition Plan: Admitted to telemetry medical unit  Consults called: Please consult cardiology in the morning  Admission status: Inpatient status.   Status is: Inpatient Patient requires at least 2 midnight for further evaluation and treatment of present condition.   Kayleen Memos MD [facility] Pager [phone]  If 7PM-7AM, please contact night-coverage [email or web address] Password [email or web address]  10/02/2021, 8:02 PM

## 2021-10-02 NOTE — ED Provider Notes (Signed)
I provided a substantive portion of the care of this patient.  I personally performed the entirety of the exam for this encounter.      Patient is anticoagulated on Coumadin.  She had fall with head injury and bleeding.  Patient has associated chest pain abdominal pain.  Patient is tearful with pain and distress.  Head has a laceration on the forehead over the brow on the left.  Active bleeding is controlled with direct pressure.  I do not see other areas.  She does have a lot of blood over her face and scalp but this appears to be the sole area of bleeding.  Respiratory status is stable.  Patient's abdomen does appear slightly distended and is tender.  Agree with plan of management.  Proceed with CT head, chest abdomen pelvis.  At this time I have reviewed labs from within the past month patient has normal renal function.  Proceed with emergent CT contrast for chest abdomen to rule out intrathoracic or abdominal injury with patient actively anticoagulated.   Charlesetta Shanks, MD 10/02/21 1426

## 2021-10-02 NOTE — ED Provider Notes (Signed)
  Physical Exam  BP (!) 154/60   Pulse 76   Temp 99 F (37.2 C) (Oral)   Resp (!) 28   Ht '5\' 4"'$  (1.626 m)   Wt 65.3 kg   SpO2 94%   BMI 24.72 kg/m   Physical Exam  Procedures  Procedures  ED Course / MDM    Medical Decision Making Amount and/or Complexity of Data Reviewed Labs: ordered. Radiology: ordered. ECG/medicine tests: ordered.  Risk OTC drugs. Prescription drug management. Decision regarding hospitalization.    88 year old female with a history of CHF, CAD, atrial fibrillation/flutter on warfarin, HTN, prior CVA, sick sinus syndrome s/p pacemaker placement presenting to the ED as a level 2 trauma after a fall.  Patient was reportedly exiting a store and she does not remember what occurred.  The next thing she reports is waking up in the ambulance. Concern for syncope without prodromal symptoms.  Patient received full trauma scans and did not have any acute traumatic abnormalities.  She does have lacerations over the left forehead that have been repaired.  Will likely require admission due to high risk syncope.  On my evaluation, patient is alert and able to answer questions.  She thinks that she may have passed out, but does not remember the incident and denies any prodromal symptoms.  ECG was obtained which showed atrial fibrillation/flutter with a normal rate.  She does have diffuse T wave inversions.  T wave inversions in the inferior leads do appear new from prior.  However, her most recent ECGs have been ventricularly paced and this does not appear to be the case today.  Her pacemaker was interrogated and is reportedly working appropriately.  Given these ECG changes, troponins were also obtained.  An initial troponin is 13  On review of her trauma scans, there was a concern for a possible bronchopulmonary pneumonia versus aspiration.  On speaking with the patient, she does endorse a productive cough over the past few days.  No fevers.  Given the productive cough and  CT findings, I am concerned for community-acquired pneumonia so patient was given IV Rocephin and IV azithromycin in the ED.  Given patient's high risk syncope with no prodromal symptoms, will admit for further observation and management.  Hospitalist team was contacted for admission the patient was admitted to their service in stable condition.      Sondra Come, MD 10/02/21 Colbert Coyer    Lajean Saver, MD 10/03/21 [phone]

## 2021-10-02 NOTE — Progress Notes (Addendum)
ANTICOAGULATION CONSULT NOTE - Initial Consult  Pharmacy Consult for warfarin Indication: atrial fibrillation  Allergies  Allergen Reactions   Latex Itching   Ace Inhibitors Other (See Comments)    Unknown reaction   Augmentin [Amoxicillin-Pot Clavulanate] Other (See Comments)    Unknown   Ciprofloxacin Other (See Comments)    Unknown    Levaquin [Levofloxacin In D5w] Other (See Comments)    Unknown   Zocor [Simvastatin] Other (See Comments)    Unknown    Acrylic Polymer [Carbomer] Itching   Chocolate Other (See Comments)    Migraines    Gabapentin Other (See Comments)    Unsteady gait     Patient Measurements: Height: '5\' 4"'$  (162.6 cm) Weight: 65.3 kg (144 lb) IBW/kg (Calculated) : 54.7  Vital Signs: Temp: 99 F (37.2 C) (06/02 1410) Temp Source: Oral (06/02 1410) BP: 154/60 (06/02 1930) Pulse Rate: 76 (06/02 1930)  Labs: Recent Labs    10/02/21 1356 10/02/21 1430 10/02/21 1733  HGB 13.1 13.9  --   HCT 41.3 41.0  --   PLT 186  --   --   LABPROT 19.8*  --   --   INR 1.7*  --   --   CREATININE 0.85 0.80  --   TROPONINIHS  --   --  13    Estimated Creatinine Clearance: 38.7 mL/min (by C-G formula based on SCr of 0.8 mg/dL).   Medical History: Past Medical History:  Diagnosis Date   Anxiety    Atrial fib/flutter, transient    Atrial fibrillation, chronic (HCC)    CHF (congestive heart failure) ([place]) 10/29/2009   Echo - EF >55%; normal LV size and systolic function; unable to assess diastolic fcn due to E/A fusion, pulmonary vein flow pattern suggests elevated filling pressure; marked biatrail dilation, mild/mod tricuspid regurgitation; mod pulmonary htn; mild/mod mitral regurgitation; although echocardiographic features are incomplete findings suggest possible infiltrative cardiomyopathy (maybe amyloidosi   Coronary artery disease 03/19/2002   R/P Cardiolite - EF 76%; nromal static and dynamic myocardial perfusion images; normal wall motion and  endocardial thickening in all vascular territories   Dyspnea    Dysrhythmia    Facial numbness 12/26/2008   carotid doppler - R and L ICAs 0-49% diameter reduction (velocities suggest low end of scale)   Hypertension    Pacemaker    Peripheral neuropathy    Pneumonia 2017   Skin cancer    s/p surgical removal.   Stroke ([place]) 04/2017   TIA (transient ischemic attack)     Medications:  (Not in a hospital admission)  Scheduled:   ipratropium-albuterol  3 mL Nebulization Q6H   Infusions:   [START ON 10/03/2021] azithromycin (ZITHROMAX) 500 MG IVPB (Vial-Mate [employer]or)     [START ON 10/03/2021] cefTRIAXone (ROCEPHIN)  IV     PRN: acetaminophen, guaiFENesin-dextromethorphan, melatonin, ondansetron (ZOFRAN) IV, polyethylene glycol  Assessment: 57 yof with a history of AF w/ slow ventriular response and medtronic PM, TIA, HTN, CAD, HF presenting after a syncopal episode in store parking lot. Presented to ED with forehead laceration. Warfarin per pharmacy consult placed for AF.  CT head with no acute injury.  Patient takes warfarin prior to arrival. Home dose is 3 mg (3 mg x 1) every Sun, Tue, Thu; 4.5 mg (3 mg x 1.5) all other days. Warfarin last taken 6/1 per patient's medication history.  PT/INR 19.8/1.7 Hgb 13.9; plt 186  Goal of Therapy:  INR 2-3 Monitor platelets by anticoagulation protocol: Yes   Plan:  Will not bridge for now as concerned for re-bleeding Give '5mg'$  warfarin tonight - f/u dosing per INR Monitor daily INR and H/H Monitor for s/s of bleeding  Lorelei Pont, PharmD, BCPS 10/02/2021 8:21 PM ED Clinical Pharmacist -  [phone]

## 2021-10-02 NOTE — Progress Notes (Signed)
Orthopedic Tech Progress Note Patient Details:  Misty Blackwell [birth year] [id number] Level 2 Trauma Patient ID: [name], female   DOB: July 23, 1927, 88 y.o.   MRN: [id number]  Chip Boer 10/02/2021, 4:37 PM

## 2021-10-02 NOTE — Progress Notes (Signed)
[name] received to room 3E12C/3E12C-01 from ER with Syncope s/p fall.    BP (!) 155/56 (BP Location: Left Arm)   Pulse 64   Temp 98 F (36.7 C) (Oral)   Resp (!) 21   Ht '5\' 4"'$  (1.626 m)   Wt 67.1 kg   SpO2 91%   BMI 25.39 kg/m   Patient oriented to room and unit.  Call bell and personal items in reach.  Admission history completed.  Plan of care initiated. Ayesha Mohair BSN RN CMSRN 10/02/2021, 10:33 PM

## 2021-10-03 ENCOUNTER — Encounter (HOSPITAL_COMMUNITY): Payer: Self-pay | Admitting: Internal Medicine

## 2021-10-03 DIAGNOSIS — I10 Essential (primary) hypertension: Secondary | ICD-10-CM

## 2021-10-03 DIAGNOSIS — I5032 Chronic diastolic (congestive) heart failure: Secondary | ICD-10-CM

## 2021-10-03 DIAGNOSIS — I4821 Permanent atrial fibrillation: Secondary | ICD-10-CM

## 2021-10-03 DIAGNOSIS — Z950 Presence of cardiac pacemaker: Secondary | ICD-10-CM

## 2021-10-03 LAB — CBC WITH DIFFERENTIAL/PLATELET
Abs Immature Granulocytes: 0.04 10*3/uL (ref 0.00–0.07)
Basophils Absolute: 0.1 10*3/uL (ref 0.0–0.1)
Basophils Relative: 1 %
Eosinophils Absolute: 0.1 10*3/uL (ref 0.0–0.5)
Eosinophils Relative: 1 %
HCT: 36.2 % (ref 36.0–46.0)
Hemoglobin: 12 g/dL (ref 12.0–15.0)
Immature Granulocytes: 0 %
Lymphocytes Relative: 23 %
Lymphs Abs: 3.1 10*3/uL (ref 0.7–4.0)
MCH: 30.6 pg (ref 26.0–34.0)
MCHC: 33.1 g/dL (ref 30.0–36.0)
MCV: 92.3 fL (ref 80.0–100.0)
Monocytes Absolute: 1.4 10*3/uL — ABNORMAL HIGH (ref 0.1–1.0)
Monocytes Relative: 11 %
Neutro Abs: 8.5 10*3/uL — ABNORMAL HIGH (ref 1.7–7.7)
Neutrophils Relative %: 64 %
Platelets: 165 10*3/uL (ref 150–400)
RBC: 3.92 MIL/uL (ref 3.87–5.11)
RDW: 12.5 % (ref 11.5–15.5)
WBC: 13.2 10*3/uL — ABNORMAL HIGH (ref 4.0–10.5)
nRBC: 0 % (ref 0.0–0.2)

## 2021-10-03 LAB — COMPREHENSIVE METABOLIC PANEL
ALT: 13 U/L (ref 0–44)
AST: 17 U/L (ref 15–41)
Albumin: 3.3 g/dL — ABNORMAL LOW (ref 3.5–5.0)
Alkaline Phosphatase: 90 U/L (ref 38–126)
Anion gap: 8 (ref 5–15)
BUN: 18 mg/dL (ref 8–23)
CO2: 24 mmol/L (ref 22–32)
Calcium: 8.7 mg/dL — ABNORMAL LOW (ref 8.9–10.3)
Chloride: 105 mmol/L (ref 98–111)
Creatinine, Ser: 0.7 mg/dL (ref 0.44–1.00)
GFR, Estimated: >60 mL/min (ref >60)
Glucose, Bld: 117 mg/dL — ABNORMAL HIGH (ref 70–99)
Potassium: 3.8 mmol/L (ref 3.5–5.1)
Sodium: 137 mmol/L (ref 135–145)
Total Bilirubin: 0.8 mg/dL (ref 0.3–1.2)
Total Protein: 6.2 g/dL — ABNORMAL LOW (ref 6.5–8.1)

## 2021-10-03 LAB — PROCALCITONIN: Procalcitonin: 0.17 ng/mL

## 2021-10-03 LAB — PROTIME-INR
INR: 1.7 — ABNORMAL HIGH (ref 0.8–1.2)
Prothrombin Time: 19.9 seconds — ABNORMAL HIGH (ref 11.4–15.2)

## 2021-10-03 LAB — MAGNESIUM: Magnesium: 2 mg/dL (ref 1.7–2.4)

## 2021-10-03 LAB — PHOSPHORUS: Phosphorus: 2.6 mg/dL (ref 2.5–4.6)

## 2021-10-03 MED ORDER — IPRATROPIUM-ALBUTEROL 0.5-2.5 (3) MG/3ML IN SOLN
3.0000 mL | Freq: Three times a day (TID) | RESPIRATORY_TRACT | Status: DC
  Administered 2021-10-03 (×2): 3 mL via RESPIRATORY_TRACT
  Filled 2021-10-03 (×2): qty 3

## 2021-10-03 MED ORDER — IPRATROPIUM-ALBUTEROL 0.5-2.5 (3) MG/3ML IN SOLN: 3.0000 mL | Freq: Four times a day (QID) | RESPIRATORY_TRACT | Status: DC | PRN

## 2021-10-03 MED ORDER — HYDRALAZINE HCL 25 MG PO TABS
25.0000 mg | ORAL_TABLET | Freq: Four times a day (QID) | ORAL | Status: DC | PRN
  Administered 2021-10-04: 25 mg via ORAL
  Filled 2021-10-03: qty 1

## 2021-10-03 MED ORDER — WARFARIN SODIUM 3 MG PO TABS
6.0000 mg | ORAL_TABLET | Freq: Once | ORAL | Status: AC
  Administered 2021-10-03: 6 mg via ORAL
  Filled 2021-10-03: qty 2

## 2021-10-03 MED ORDER — TRAMADOL HCL 50 MG PO TABS
50.0000 mg | ORAL_TABLET | Freq: Once | ORAL | Status: AC | PRN
  Administered 2021-10-04: 50 mg via ORAL
  Filled 2021-10-03: qty 1

## 2021-10-03 NOTE — Progress Notes (Signed)
ANTICOAGULATION CONSULT NOTE - Initial Consult  Pharmacy Consult for warfarin Indication: atrial fibrillation  Allergies  Allergen Reactions   Latex Itching   Ace Inhibitors Other (See Comments)    Unknown reaction   Augmentin [Amoxicillin-Pot Clavulanate] Other (See Comments)    Unknown   Ciprofloxacin Other (See Comments)    Unknown    Levaquin [Levofloxacin In D5w] Other (See Comments)    Unknown   Zocor [Simvastatin] Other (See Comments)    Unknown    Acrylic Polymer [Carbomer] Itching   Chocolate Other (See Comments)    Migraines    Gabapentin Other (See Comments)    Unsteady gait     Patient Measurements: Height: '5\' 4"'$  (162.6 cm) Weight: 68.4 kg (150 lb 12.7 oz) IBW/kg (Calculated) : 54.7  Vital Signs: Temp: 98.1 F (36.7 C) (06/03 0731) Temp Source: Oral (06/03 0731) BP: 153/59 (06/03 0731) Pulse Rate: 63 (06/03 0731)  Labs: Recent Labs    10/02/21 1356 10/02/21 1430 10/02/21 1733 10/02/21 1930 10/03/21 0349  HGB 13.1 13.9  --   --  12.0  HCT 41.3 41.0  --   --  36.2  PLT 186  --   --   --  165  LABPROT 19.8*  --   --   --  19.9*  INR 1.7*  --   --   --  1.7*  CREATININE 0.85 0.80  --   --  0.70  TROPONINIHS  --   --  13 15  --      Estimated Creatinine Clearance: 42.6 mL/min (by C-G formula based on SCr of 0.7 mg/dL).   Medical History: Past Medical History:  Diagnosis Date   Anxiety    Atrial fib/flutter, transient    Atrial fibrillation, chronic (HCC)    CHF (congestive heart failure) ([place]) 10/29/2009   Echo - EF >55%; normal LV size and systolic function; unable to assess diastolic fcn due to E/A fusion, pulmonary vein flow pattern suggests elevated filling pressure; marked biatrail dilation, mild/mod tricuspid regurgitation; mod pulmonary htn; mild/mod mitral regurgitation; although echocardiographic features are incomplete findings suggest possible infiltrative cardiomyopathy (maybe amyloidosi   Coronary artery disease 03/19/2002   R/P  Cardiolite - EF 76%; nromal static and dynamic myocardial perfusion images; normal wall motion and endocardial thickening in all vascular territories   Dyspnea    Dysrhythmia    Facial numbness 12/26/2008   carotid doppler - R and L ICAs 0-49% diameter reduction (velocities suggest low end of scale)   Hypertension    Pacemaker    Peripheral neuropathy    Pneumonia 2017   Skin cancer    s/p surgical removal.   Stroke ([place]) 04/2017   TIA (transient ischemic attack)     Medications:  Medications Prior to Admission  Medication Sig Dispense Refill Last Dose   acetaminophen (TYLENOL) 325 MG tablet Take 325-650 mg by mouth every 6 (six) hours as needed for mild pain or headache.   unk   acidophilus (RISAQUAD) CAPS capsule Take 1 capsule by mouth daily.   10/02/2021   anastrozole (ARIMIDEX) 1 MG tablet TAKE 1 TABLET(1 MG) BY MOUTH DAILY (Patient taking differently: Take 1 mg by mouth daily.) 90 tablet 3 10/02/2021   Ascorbic Acid (VITAMIN C) 1000 MG tablet Take 1,000 mg by mouth daily.    10/02/2021   Cholecalciferol (VITAMIN D-3) 125 MCG (5000 UT) TABS Take 5,000 Units by mouth daily.   10/02/2021   clotrimazole-betamethasone (LOTRISONE) cream APPLY TOPICALLY TO THE AFFECTED AREA TWICE  DAILY (Patient taking differently: Apply 1 application. topically 2 (two) times daily as needed (to affected area).) 45 g 3 unk   Cyanocobalamin (VITAMIN B 12 PO) Take 1,000 mcg by mouth daily.    10/02/2021 at am   diltiazem (CARDIZEM CD) 240 MG 24 hr capsule Take 1 capsule (240 mg total) by mouth daily. 30 capsule 6 10/02/2021 at am   Ferrous Sulfate 90 (18 Fe) MG TABS Take 18 mg by mouth daily with breakfast.   10/02/2021   furosemide (LASIX) 20 MG tablet Take 2 tablets (40 mg total) by mouth daily. (Patient taking differently: Take 20 mg by mouth in the morning and at bedtime.) 30 tablet 11 10/02/2021   hydrOXYzine (ATARAX) 50 MG tablet TAKE 1 TO 2 TABLETS BY MOUTH EVERY NIGHT 1 HOUR BEFORE BEDTIME AS NEEDED FOR SLEEP (Patient  taking differently: Take 100 mg by mouth at bedtime.) 60 tablet 2 10/01/2021 at pm   irbesartan (AVAPRO) 300 MG tablet TAKE 1 TABLET(300 MG) BY MOUTH DAILY (Patient taking differently: Take 300 mg by mouth daily.) 90 tablet 0 10/02/2021   Magnesium 250 MG TABS Take 250 mg by mouth daily.   10/02/2021   OVER THE COUNTER MEDICATION Apply 1 application. topically See admin instructions. Neuropathy maximum strength Nerve relief and recovery cream- Apply to the feet at bedtime   10/01/2021 at pm   potassium chloride SA (KLOR-CON M) 20 MEQ tablet Take  1 tablet  2 x /day  for Potassium (Patient taking differently: Take 20 mEq by mouth in the morning and at bedtime.) 180 tablet 1 10/02/2021   warfarin (COUMADIN) 3 MG tablet TAKE 1 TO 1 AND 1/2 TABLET BY MOUTH AS DIRECTED 135 tablet 1 10/01/2021 at 1700   zinc gluconate 50 MG tablet Take 50 mg by mouth daily.   10/02/2021   pregabalin (LYRICA) 25 MG capsule Take 1 capsule (25 mg total) by mouth 2 (two) times daily. (Patient not taking: Reported on 10/02/2021) 60 capsule 0 Not Taking   Scheduled:   acidophilus  1 capsule Oral Daily   anastrozole  1 mg Oral Daily   vitamin C  1,000 mg Oral Daily   cholecalciferol  5,000 Units Oral Daily   clotrimazole   Topical BID   ferrous sulfate  325 mg Oral Q breakfast   hydrOXYzine  100 mg Oral QHS   ipratropium-albuterol  3 mL Nebulization TID   irbesartan  300 mg Oral Daily   vitamin B-12  1,000 mcg Oral Daily   Warfarin - Pharmacist Dosing Inpatient   Does not apply q1600   Infusions:   azithromycin (ZITHROMAX) 500 MG IVPB (Vial-Mate [employer])     cefTRIAXone (ROCEPHIN)  IV 2 g (10/03/21 0851)   lactated ringers 50 mL/hr at 10/03/21 0600   PRN: acetaminophen, guaiFENesin-dextromethorphan, HYDROmorphone (DILAUDID) injection, melatonin, ondansetron (ZOFRAN) IV, oxyCODONE, polyethylene glycol  Assessment: 68 yof with a history of AF w/ slow ventriular response and medtronic PM, TIA, HTN, CAD, HF presenting after a syncopal  episode in store parking lot. Presented to ED with forehead laceration. Warfarin per pharmacy consult placed for AF.  CT head with no acute injury.  Patient takes warfarin prior to arrival. Home dose is 3 mg (3 mg x 1) every Sun, Tue, Thu; 4.5 mg (3 mg x 1.5) all other days. Warfarin last taken 6/1 per patient's medication history.  INR is subtherapeutic at 1.7. Given patient's age, drug-drug interaction with warfarin and azithromycin (macrolides enhance the anticoagulant effect of warfarin), and  documentation patient is only 25% of meals, will dose conservatively.     Goal of Therapy:  INR 2-3 Monitor platelets by anticoagulation protocol: Yes   Plan:  Will not bridge for now as concerned for re-bleeding Warfarin 6 mg x1 Monitor daily INR and H/H Monitor for s/s of bleeding  Joseph Art, Pharm.D. PGY-1 Pharmacy Resident [phone] 10/03/2021 9:35 AM

## 2021-10-03 NOTE — Consult Note (Signed)
Cardiology Consultation:   [name] MRN: [id number]; DOB: April 02, 1928  Admit date: 10/02/2021 Date of Consult: 10/03/2021  PCP:  Misty Pinto, MD   [facility] Providers Cardiologist:  Misty Klein, MD        Patient Profile:   Misty Blackwell is a 88 y.o. female with a hx of permanent atrial fibrillation with slow ventricular response and permanent pacemaker implantation who is being seen 10/03/2021 for the evaluation of syncope at the request of Misty Blackwell.  History of Present Illness:   Misty Blackwell is a independent 88 year old woman with longstanding atrial fibrillation with slow ventricular response, chronic diastolic heart failure, hypertension, peripheral neuropathy, remote history of stroke.  She has a dual-chamber permanent pacemaker (initially implanted for sick sinus syndrome), that is programmed VVIR for permanent atrial fibrillation.  She underwent a generator change earlier this year.  She was walking out of a [employer] store when she lost consciousness.  She does not remember any prodromal symptoms of dizziness, lightheadedness, flushing, blurred vision, nausea etc.  She woke up in the ambulance and had no idea how she got there.  She does not remember tripping or falling.  Over the last several weeks she has developed multiple episodes of severe vertigo, for example when she suddenly looks to one side or when she lays her head back at the beauty parlor.  "I just turn my head and I am gone".  Note that in late March she did have another fall, tripping over a rug in her bathroom and developed a supraorbital right hematoma at that time (she did not seek immediate medical attention).  She did not Blackwell consciousness with that event.  Denies recent problems with lower extremity edema, orthopnea, PND, exertional dyspnea.  She has not not had complaints of chest pain at rest or with activity.  She is not aware of palpitations.  No complaints of  claudication or other new focal neurological issues, other than chronic neuropathic symptoms.  Head and neck CT showed extracranial bleeding/hematoma and some chronic arthritic changes in the cervical spine as well as evidence of carotid atherosclerotic plaque calcification, but there is no intracranial bleeding.  CT of the chest abdomen and pelvis does not show any evidence of traumatic injury injury.  Echocardiogram performed in January 2023 shows normal left ventricular systolic function comment was made that she has "grade 2 diastolic dysfunction pseudonormalization", but this study was performed while in atrial fibrillation so diastolic function cannot be accurately assessed.  She does not have any serious valvular abnormalities.  She had a normal nuclear stress test in 2019 carotid duplex ultrasonography has not been performed since 2010, where she was found to just have mild plaque without obstruction.  Pacemaker interrogation shows normal device function.  Generator is at beginning of life.  Ventricular lead impedance, sensing and capture threshold are all in nominal range and unchanged from previous evaluation.  There have been no episodes of high ventricular rate.  She only required 60.5% ventricular pacing.  Underlying rhythm today is atrial fibrillation with slow ventricular response with a rate of roughly 45 bpm.  She is not pacemaker dependent.   Past Medical History:  Diagnosis Date   Anxiety    Atrial fib/flutter, transient    Atrial fibrillation, chronic (HCC)    CHF (congestive heart failure) ([place]) 10/29/2009   Echo - EF >55%; normal LV size and systolic function; unable to assess diastolic fcn due to E/A fusion, pulmonary vein flow pattern suggests elevated  filling pressure; marked biatrail dilation, mild/mod tricuspid regurgitation; mod pulmonary htn; mild/mod mitral regurgitation; although echocardiographic features are incomplete findings suggest possible infiltrative  cardiomyopathy (maybe amyloidosi   Coronary artery disease 03/19/2002   R/P Cardiolite - EF 76%; nromal static and dynamic myocardial perfusion images; normal wall motion and endocardial thickening in all vascular territories   Dyspnea    Dysrhythmia    Facial numbness 12/26/2008   carotid doppler - R and L ICAs 0-49% diameter reduction (velocities suggest low end of scale)   Hypertension    Pacemaker    Peripheral neuropathy    Pneumonia 2017   Skin cancer    s/p surgical removal.   Stroke ([place]) 04/2017   TIA (transient ischemic attack)     Past Surgical History:  Procedure Laterality Date   ABDOMINAL HYSTERECTOMY     APPENDECTOMY     BREAST LUMPECTOMY WITH RADIOACTIVE SEED LOCALIZATION Right 12/13/2019   Procedure: RIGHT BREAST LUMPECTOMY WITH RADIOACTIVE SEED LOCALIZATION;  Surgeon: Misty Mesa, MD;  Location: [facility];  Service: General;  Laterality: Right;  LMA VS MAC   BREAST SURGERY Left [place]  08/06/2005   minimal coronary disease predominant RCA; no significant atherosclerosis; new onset sick sinus syndrome and atrial flutter w/ ventricular response, controlled on med therapy; systemic HTN, normal renal arteries   CARDIOVERSION  11/19/2009   successful DCCV from AF to sinus type rhythm   CHOLECYSTECTOMY     EYE SURGERY Bilateral 2013   PPM GENERATOR CHANGEOUT N/A 08/24/2021   Procedure: [place];  Surgeon: Misty Klein, MD;  Location: [facility];  Service: Cardiovascular;  Laterality: N/A;   Skin cancer resection     TONSILLECTOMY  1938     Home Medications:  Prior to Admission medications   Medication Sig Start Date End Date Taking? Authorizing Provider  acetaminophen (TYLENOL) 325 MG tablet Take 325-650 mg by mouth every 6 (six) hours as needed for mild pain or headache.   Yes [provider]  acidophilus (RISAQUAD) CAPS capsule Take 1 capsule by mouth daily.   Yes [provider]  anastrozole  (ARIMIDEX) 1 MG tablet TAKE 1 TABLET(1 MG) BY MOUTH DAILY Patient taking differently: Take 1 mg by mouth daily. 07/02/21  Yes Misty Lose, MD  Ascorbic Acid (VITAMIN C) 1000 MG tablet Take 1,000 mg by mouth daily.    Yes [provider]  Cholecalciferol (VITAMIN D-3) 125 MCG (5000 UT) TABS Take 5,000 Units by mouth daily.   Yes [provider]  clotrimazole-betamethasone (LOTRISONE) cream APPLY TOPICALLY TO THE AFFECTED AREA TWICE DAILY Patient taking differently: Apply 1 application. topically 2 (two) times daily as needed (to affected area). 08/19/21  Yes Misty Pinto, MD  Cyanocobalamin (VITAMIN B 12 PO) Take 1,000 mcg by mouth daily.    Yes [provider]  diltiazem (CARDIZEM CD) 240 MG 24 hr capsule Take 1 capsule (240 mg total) by mouth daily. 10/02/21  Yes Avaleigh Decuir, MD  Ferrous Sulfate 90 (18 Fe) MG TABS Take 18 mg by mouth daily with breakfast.   Yes [provider]  furosemide (LASIX) 20 MG tablet Take 2 tablets (40 mg total) by mouth daily. Patient taking differently: Take 20 mg by mouth in the morning and at bedtime. 05/18/21  Yes Calyn Rubi, MD  hydrOXYzine (ATARAX) 50 MG tablet TAKE 1 TO 2 TABLETS BY MOUTH EVERY NIGHT 1 HOUR BEFORE BEDTIME AS NEEDED FOR SLEEP Patient taking differently: Take 100 mg by mouth  at bedtime. 09/02/21  Yes Liane Comber, NP  irbesartan (AVAPRO) 300 MG tablet TAKE 1 TABLET(300 MG) BY MOUTH DAILY Patient taking differently: Take 300 mg by mouth daily. 09/02/21  Yes Liane Comber, NP  Magnesium 250 MG TABS Take 250 mg by mouth daily.   Yes [provider]  OVER THE COUNTER MEDICATION Apply 1 application. topically See admin instructions. Neuropathy maximum strength Nerve relief and recovery cream- Apply to the feet at bedtime   Yes [provider]  potassium chloride SA (KLOR-CON M) 20 MEQ tablet Take  1 tablet  2 x /day  for Potassium Patient taking differently: Take 20 mEq by mouth in the  morning and at bedtime. 08/03/21  Yes Misty Pinto, MD  warfarin (COUMADIN) 3 MG tablet TAKE 1 TO 1 AND 1/2 TABLET BY MOUTH AS DIRECTED 10/02/21  Yes Magda Bernheim, NP  zinc gluconate 50 MG tablet Take 50 mg by mouth daily.   Yes [provider]  pregabalin (LYRICA) 25 MG capsule Take 1 capsule (25 mg total) by mouth 2 (two) times daily. Patient not taking: Reported on 10/02/2021 06/15/21   Misty Pinto, MD    Inpatient Medications: Scheduled Meds:  acidophilus  1 capsule Oral Daily   anastrozole  1 mg Oral Daily   vitamin C  1,000 mg Oral Daily   cholecalciferol  5,000 Units Oral Daily   clotrimazole   Topical BID   ferrous sulfate  325 mg Oral Q breakfast   hydrOXYzine  100 mg Oral QHS   ipratropium-albuterol  3 mL Nebulization TID   irbesartan  300 mg Oral Daily   vitamin B-12  1,000 mcg Oral Daily   warfarin  6 mg Oral ONCE-1600   Warfarin - Pharmacist Dosing Inpatient   Does not apply q1600   Continuous Infusions:  azithromycin (ZITHROMAX) 500 MG IVPB (Vial-Mate [employer])     cefTRIAXone (ROCEPHIN)  IV 2 g (10/03/21 0851)   lactated ringers 50 mL/hr at 10/03/21 0600   PRN Meds: acetaminophen, guaiFENesin-dextromethorphan, HYDROmorphone (DILAUDID) injection, melatonin, ondansetron (ZOFRAN) IV, oxyCODONE, polyethylene glycol  Allergies:    Allergies  Allergen Reactions   Latex Itching   Ace Inhibitors Other (See Comments)    Unknown reaction   Augmentin [Amoxicillin-Pot Clavulanate] Other (See Comments)    Unknown   Ciprofloxacin Other (See Comments)    Unknown    Levaquin [Levofloxacin In D5w] Other (See Comments)    Unknown   Zocor [Simvastatin] Other (See Comments)    Unknown    Acrylic Polymer [Carbomer] Itching   Chocolate Other (See Comments)    Migraines    Gabapentin Other (See Comments)    Unsteady gait     Social History:   Social History   Socioeconomic History   Marital status: Widowed    Spouse name: Not on file   Number of children:  0   Years of education: Not on file   Highest education level: Not on file  Occupational History   Not on file  Tobacco Use   Smoking status: Never   Smokeless tobacco: Never  Vaping Use   Vaping Use: Never used  Substance and Sexual Activity   Alcohol use: No   Drug use: No   Sexual activity: Never    Birth control/protection: None  Other Topics Concern   Not on file  Social History Narrative   Widowed.  Lives alone.  Ambulates independently.   Social Determinants of Health   Financial Resource Strain: Not on file  Food Insecurity: Not on file  Transportation Needs: Not on file  Physical Activity: Not on file  Stress: Not on file  Social Connections: Not on file  Intimate Partner Violence: Not on file    Family History:    Family History  Problem Relation Age of Onset   Heart disease Mother    Diabetes Mother    Heart attack Father    Heart disease Father    Cirrhosis Brother      ROS:  Please see the history of present illness.   All other ROS reviewed and negative.     Physical Exam/Data:   Vitals:   10/03/21 0403 10/03/21 0731 10/03/21 0735 10/03/21 1115  BP: (!) 142/49 (!) 153/59  (!) 127/53  Pulse: 63 63  60  Resp: 20 (!) 21  18  Temp: 99.6 F (37.6 C) 98.1 F (36.7 C)  98.5 F (36.9 C)  TempSrc: Oral Oral  Oral  SpO2: 90% 96% 97% 97%  Weight: 68.4 kg     Height:        Intake/Output Summary (Last 24 hours) at 10/03/2021 1258 Last data filed at 10/03/2021 1941 Gross per 24 hour  Intake 1053.83 ml  Output 150 ml  Net 903.83 ml      10/03/2021    4:03 AM 10/02/2021    9:18 PM 10/02/2021    2:20 PM  Last 3 Weights  Weight (lbs) 150 lb 12.7 oz 147 lb 14.9 oz 144 lb  Weight (kg) 68.4 kg 67.1 kg 65.318 kg     Body mass index is 25.88 kg/m.  General:  Well nourished, well developed, in no acute distress HEENT: Large left periorbital hematoma Neck: no JVD Vascular: No carotid bruits; Distal pulses 2+ bilaterally Cardiac:  normal S1,  paradoxically split S2; RRR; no murmur .  Healthy left subclavian pacemaker site Lungs:  clear to auscultation bilaterally, no wheezing, rhonchi or rales  Abd: soft, nontender, no hepatomegaly  Ext: no edema Musculoskeletal:  No deformities, BUE and BLE strength normal and equal Skin: warm and dry  Neuro:  CNs 2-12 intact, no focal abnormalities noted Psych:  Normal affect   EKG:  The EKG was personally reviewed and demonstrates: Fibrillation with controlled ventricular response, prominent T wave inversions in the lateral leads with normal QTc Telemetry:  Telemetry was personally reviewed and demonstrates: Background atrial fibrillation with alternating periods of ventricular paced and ventricular sensed rhythm  Relevant CV Studies:  1. Left ventricular ejection fraction, by estimation, is 60 to 65%. The  left ventricle has normal function. The left ventricle has no regional  wall motion abnormalities. There is mild concentric left ventricular  hypertrophy. Left ventricular diastolic  parameters are consistent with Grade II diastolic dysfunction  (pseudonormalization).   2. Right ventricular systolic function is mildly reduced. The right  ventricular size is normal. There is mildly elevated pulmonary artery  systolic pressure.   3. Left atrial size was mildly dilated.   4. Right atrial size was moderately dilated.   5. The mitral valve is normal in structure. Mild mitral valve  regurgitation. No evidence of mitral stenosis.   6. The aortic valve is calcified. Aortic valve regurgitation is mild.  Aortic valve sclerosis/calcification is present, without any evidence of  aortic stenosis. Aortic regurgitation PHT measures 503 msec. Aortic valve  area, by VTI measures 2.10 cm.  Aortic valve mean gradient measures 5.0 mmHg. Aortic valve Vmax measures  1.60 m/s.   7. The inferior vena cava is normal  in size with greater than 50%  respiratory variability, suggesting right atrial pressure of  3 mmHg.   Laboratory Data:  High Sensitivity Troponin:   Recent Labs  Lab 10/02/21 1733 10/02/21 1930  TROPONINIHS 13 15     Chemistry Recent Labs  Lab 10/02/21 1356 10/02/21 1430 10/03/21 0349  NA 140 139 137  K 4.4 4.3 3.8  CL 105 103 105  CO2 25  --  24  GLUCOSE 121* 118* 117*  BUN 20 24* 18  CREATININE 0.85 0.80 0.70  CALCIUM 9.2  --  8.7*  MG  --   --  2.0  GFRNONAA >60  --  >60  ANIONGAP 10  --  8    Recent Labs  Lab 10/02/21 1356 10/03/21 0349  PROT 7.2 6.2*  ALBUMIN 3.8 3.3*  AST 20 17  ALT 15 13  ALKPHOS 106 90  BILITOT 1.1 0.8   Lipids No results for input(s): CHOL, TRIG, HDL, LABVLDL, LDLCALC, CHOLHDL in the last 168 hours.  Hematology Recent Labs  Lab 10/02/21 1356 10/02/21 1430 10/03/21 0349  WBC 16.5*  --  13.2*  RBC 4.42  --  3.92  HGB 13.1 13.9 12.0  HCT 41.3 41.0 36.2  MCV 93.4  --  92.3  MCH 29.6  --  30.6  MCHC 31.7  --  33.1  RDW 12.1  --  12.5  PLT 186  --  165   Thyroid No results for input(s): TSH, FREET4 in the last 168 hours.  BNPNo results for input(s): BNP, PROBNP in the last 168 hours.  DDimer No results for input(s): DDIMER in the last 168 hours.   Radiology/Studies:  CT Head Wo Contrast  Result Date: 10/02/2021 CLINICAL DATA:  Head trauma, moderate-severe EXAM: CT HEAD WITHOUT CONTRAST CT MAXILLOFACIAL WITHOUT CONTRAST TECHNIQUE: Multidetector CT imaging of the head and maxillofacial structures were performed using the standard protocol without intravenous contrast. Multiplanar CT image reconstructions of the maxillofacial structures were also generated. RADIATION DOSE REDUCTION: This exam was performed according to the departmental dose-optimization program which includes automated exposure control, adjustment of the mA and/or kV according to patient size and/or use of iterative reconstruction technique. COMPARISON:  December 2021 FINDINGS: CT HEAD FINDINGS Brain: No acute intracranial hemorrhage, mass effect or edema.  Gray-white differentiation is preserved. Prominence of the ventricles and sulci reflects similar parenchymal volume loss. Minimal patchy hypoattenuation in the supratentorial white matter is nonspecific but may reflect similar minor chronic microvascular ischemic changes. No extra-axial collection. Vascular: There is intracranial atherosclerotic calcification at the skull base. Skull: Unremarkable. Other: Left frontal scalp hematoma. CT MAXILLOFACIAL FINDINGS Osseous: No acute facial fracture. Degenerative changes at the left temporomandibular joint. Orbits: No intraorbital hematoma. Sinuses: Mild mucosal thickening. Soft tissues: Left frontal scalp and periorbital soft tissue swelling with hematoma and laceration. IMPRESSION: No evidence of acute intracranial injury.  No acute facial fracture. Electronically Signed   By: Macy Mis M.D.   On: 10/02/2021 14:48   CT Cervical Spine Wo Contrast  Result Date: 10/02/2021 CLINICAL DATA:  Polytrauma, blunt; Low back pain, trauma; Mid-back pain. EXAM: CT CERVICAL, THORACIC, AND LUMBAR SPINE WITHOUT CONTRAST TECHNIQUE: Multidetector CT imaging of the cervical, thoracic and lumbar spine was performed without intravenous contrast. Multiplanar CT image reconstructions were also generated. RADIATION DOSE REDUCTION: This exam was performed according to the departmental dose-optimization program which includes automated exposure control, adjustment of the mA and/or kV according to patient size and/or use of iterative reconstruction technique. COMPARISON:  CT  angiography chest 05/12/2021, CT cervical spine 04/05/2020 FINDINGS: CT CERVICAL SPINE FINDINGS Alignment: Normal. Skull base and vertebrae: Multilevel degenerative changes spine most prominent at the C4-C5 level. Associated severe right osseous neural foraminal stenosis at the C4-C5 level. No severe osseous central canal stenosis. No acute fracture. No aggressive appearing focal osseous lesion or focal pathologic  process. Soft tissues and spinal canal: No prevertebral fluid or swelling. No visible canal hematoma. Upper chest: Biapical pleural/pulmonary scarring. Other: None. CT THORACIC SPINE FINDINGS Alignment: Normal. Vertebrae: No acute fracture or focal pathologic process. Paraspinal and other soft tissues: Negative. Disc levels: Multilevel neural intervertebral disc space vacuum phenomenon. Otherwise maintained. CT LUMBAR SPINE FINDINGS Segmentation: 5 lumbar type vertebrae. Alignment: Levocurvature of the lumbar spine centered at the L2-L3 level may be positional. Vertebrae: Multilevel osteophyte formation and facet arthropathy. Multilevel posterior disc osteophyte complex. No severe osseous neural foraminal or central canal stenosis. No acute fracture or focal pathologic process. Paraspinal and other soft tissues: Negative. Disc levels: Multilevel intervertebral disc space narrowing and vacuum phenomenon. Other: Atherosclerotic plaque. IMPRESSION: 1. No acute displaced fracture or traumatic listhesis of the cervical spine. 2. No acute displaced fracture or traumatic listhesis of the thoracic spine. 3. No acute displaced fracture or traumatic listhesis of the lumbar spine. 4. Severe osseous neural foraminal stenosis at the right C4-C5 level. 5. Please see separately dictated CT head, chest, abdomen, pelvis 10/02/2021. 6.  Aortic Atherosclerosis (ICD10-I70.0). Electronically Signed   By: Iven Finn M.D.   On: 10/02/2021 15:20   CT Lumbar Spine Wo Contrast  Result Date: 10/02/2021 CLINICAL DATA:  Polytrauma, blunt; Low back pain, trauma; Mid-back pain. EXAM: CT CERVICAL, THORACIC, AND LUMBAR SPINE WITHOUT CONTRAST TECHNIQUE: Multidetector CT imaging of the cervical, thoracic and lumbar spine was performed without intravenous contrast. Multiplanar CT image reconstructions were also generated. RADIATION DOSE REDUCTION: This exam was performed according to the departmental dose-optimization program which includes  automated exposure control, adjustment of the mA and/or kV according to patient size and/or use of iterative reconstruction technique. COMPARISON:  CT angiography chest 05/12/2021, CT cervical spine 04/05/2020 FINDINGS: CT CERVICAL SPINE FINDINGS Alignment: Normal. Skull base and vertebrae: Multilevel degenerative changes spine most prominent at the C4-C5 level. Associated severe right osseous neural foraminal stenosis at the C4-C5 level. No severe osseous central canal stenosis. No acute fracture. No aggressive appearing focal osseous lesion or focal pathologic process. Soft tissues and spinal canal: No prevertebral fluid or swelling. No visible canal hematoma. Upper chest: Biapical pleural/pulmonary scarring. Other: None. CT THORACIC SPINE FINDINGS Alignment: Normal. Vertebrae: No acute fracture or focal pathologic process. Paraspinal and other soft tissues: Negative. Disc levels: Multilevel neural intervertebral disc space vacuum phenomenon. Otherwise maintained. CT LUMBAR SPINE FINDINGS Segmentation: 5 lumbar type vertebrae. Alignment: Levocurvature of the lumbar spine centered at the L2-L3 level may be positional. Vertebrae: Multilevel osteophyte formation and facet arthropathy. Multilevel posterior disc osteophyte complex. No severe osseous neural foraminal or central canal stenosis. No acute fracture or focal pathologic process. Paraspinal and other soft tissues: Negative. Disc levels: Multilevel intervertebral disc space narrowing and vacuum phenomenon. Other: Atherosclerotic plaque. IMPRESSION: 1. No acute displaced fracture or traumatic listhesis of the cervical spine. 2. No acute displaced fracture or traumatic listhesis of the thoracic spine. 3. No acute displaced fracture or traumatic listhesis of the lumbar spine. 4. Severe osseous neural foraminal stenosis at the right C4-C5 level. 5. Please see separately dictated CT head, chest, abdomen, pelvis 10/02/2021. 6.  Aortic Atherosclerosis (ICD10-I70.0).  Electronically Signed  By: Iven Finn M.D.   On: 10/02/2021 15:20   DG Pelvis Portable  Result Date: 10/02/2021 CLINICAL DATA:  Fall, level 2 trauma. EXAM: PORTABLE PELVIS 1-2 VIEWS COMPARISON:  None Available. FINDINGS: There is no evidence of pelvic fracture or diastasis. No pelvic bone lesions are seen. IMPRESSION: No acute osseous abnormality. Electronically Signed   By: Dahlia Bailiff M.D.   On: 10/02/2021 14:25   CT CHEST ABDOMEN PELVIS W CONTRAST  Result Date: 10/02/2021 CLINICAL DATA:  Fall with chest pain and abdominal pain, on Coumadin. EXAM: CT CHEST, ABDOMEN, AND PELVIS WITH CONTRAST TECHNIQUE: Multidetector CT imaging of the chest, abdomen and pelvis was performed following the standard protocol during bolus administration of intravenous contrast. RADIATION DOSE REDUCTION: This exam was performed according to the departmental dose-optimization program which includes automated exposure control, adjustment of the mA and/or kV according to patient size and/or use of iterative reconstruction technique. CONTRAST:  14m OMNIPAQUE IOHEXOL 300 MG/ML  SOLN COMPARISON:  CT chest 05/12/2021. FINDINGS: CT CHEST FINDINGS Cardiovascular: Atherosclerotic calcification of the aorta and coronary arteries. Enlarged pulmonic trunk and heart. No pericardial effusion. Mediastinum/Nodes: 1.8 cm minimally hypodense nodule in the right thyroid. No follow-up recommended unless clinically warranted. (Ref: J Am Coll Radiol. 2015 Feb;12(2): 143-50). Mediastinal lymph nodes are not enlarged by CT size criteria. No hilar or axillary adenopathy. Esophagus is grossly unremarkable. Lungs/Pleura: Pulmonary nodules measure up to 6 mm in the right middle lobe (4/116), as on 05/12/2021. Patchy peribronchovascular nodularity and consolidation in the right lower lobe, new. No pleural fluid. Airway is unremarkable. Musculoskeletal: Degenerative changes in the spine.  No fracture. CT ABDOMEN PELVIS FINDINGS Hepatobiliary: Liver  margin is mildly irregular. Cholecystectomy. No biliary ductal dilatation. Pancreas: Negative. Spleen: Negative. Adrenals/Urinary Tract: Adrenal glands are unremarkable. Parenchymal thinning and scarring in the kidneys. Subcentimeter lesions in the left kidney, too small to characterize. No specific follow-up necessary. Ureters are decompressed. Bladder is grossly unremarkable. Stomach/Bowel: Stomach, small bowel and colon are unremarkable. Appendix is not readily visualized. Vascular/Lymphatic: Atherosclerotic calcification of the aorta. No pathologically enlarged lymph nodes. Reproductive: Hysterectomy.  No adnexal mass. Other: No free fluid.  Mesenteries and peritoneum are unremarkable. Musculoskeletal: Degenerative changes in the spine. No worrisome lytic or sclerotic lesions. IMPRESSION: 1. No evidence of acute trauma to the chest, abdomen or pelvis. 2. New peribronchovascular nodularity and consolidation in the right lower lobe, indicative of bronchopneumonia or aspiration. Difficult to definitively exclude pulmonary contusion. 3. Mild marginal regulated the liver raises suspicion for cirrhosis. 4.  Aortic atherosclerosis (ICD10-I70.0). Electronically Signed   By: MLorin PicketM.D.   On: 10/02/2021 15:21   CT T-SPINE NO CHARGE  Result Date: 10/02/2021 CLINICAL DATA:  Polytrauma, blunt; Low back pain, trauma; Mid-back pain. EXAM: CT CERVICAL, THORACIC, AND LUMBAR SPINE WITHOUT CONTRAST TECHNIQUE: Multidetector CT imaging of the cervical, thoracic and lumbar spine was performed without intravenous contrast. Multiplanar CT image reconstructions were also generated. RADIATION DOSE REDUCTION: This exam was performed according to the departmental dose-optimization program which includes automated exposure control, adjustment of the mA and/or kV according to patient size and/or use of iterative reconstruction technique. COMPARISON:  CT angiography chest 05/12/2021, CT cervical spine 04/05/2020 FINDINGS: CT  CERVICAL SPINE FINDINGS Alignment: Normal. Skull base and vertebrae: Multilevel degenerative changes spine most prominent at the C4-C5 level. Associated severe right osseous neural foraminal stenosis at the C4-C5 level. No severe osseous central canal stenosis. No acute fracture. No aggressive appearing focal osseous lesion or focal pathologic process. Soft tissues  and spinal canal: No prevertebral fluid or swelling. No visible canal hematoma. Upper chest: Biapical pleural/pulmonary scarring. Other: None. CT THORACIC SPINE FINDINGS Alignment: Normal. Vertebrae: No acute fracture or focal pathologic process. Paraspinal and other soft tissues: Negative. Disc levels: Multilevel neural intervertebral disc space vacuum phenomenon. Otherwise maintained. CT LUMBAR SPINE FINDINGS Segmentation: 5 lumbar type vertebrae. Alignment: Levocurvature of the lumbar spine centered at the L2-L3 level may be positional. Vertebrae: Multilevel osteophyte formation and facet arthropathy. Multilevel posterior disc osteophyte complex. No severe osseous neural foraminal or central canal stenosis. No acute fracture or focal pathologic process. Paraspinal and other soft tissues: Negative. Disc levels: Multilevel intervertebral disc space narrowing and vacuum phenomenon. Other: Atherosclerotic plaque. IMPRESSION: 1. No acute displaced fracture or traumatic listhesis of the cervical spine. 2. No acute displaced fracture or traumatic listhesis of the thoracic spine. 3. No acute displaced fracture or traumatic listhesis of the lumbar spine. 4. Severe osseous neural foraminal stenosis at the right C4-C5 level. 5. Please see separately dictated CT head, chest, abdomen, pelvis 10/02/2021. 6.  Aortic Atherosclerosis (ICD10-I70.0). Electronically Signed   By: Iven Finn M.D.   On: 10/02/2021 15:20   DG Chest Port 1 View  Result Date: 10/02/2021 CLINICAL DATA:  Fall EXAM: PORTABLE CHEST 1 VIEW COMPARISON:  05/12/2021 FINDINGS: Chronic  interstitial changes. No new consolidation or edema. No pleural effusion or pneumothorax. Stable cardiomegaly. Left chest wall dual lead pacemaker. Included osseous structures are grossly intact. IMPRESSION: No acute process in the chest. Electronically Signed   By: Macy Mis M.D.   On: 10/02/2021 15:06   CT Maxillofacial Wo Contrast  Result Date: 10/02/2021 CLINICAL DATA:  Head trauma, moderate-severe EXAM: CT HEAD WITHOUT CONTRAST CT MAXILLOFACIAL WITHOUT CONTRAST TECHNIQUE: Multidetector CT imaging of the head and maxillofacial structures were performed using the standard protocol without intravenous contrast. Multiplanar CT image reconstructions of the maxillofacial structures were also generated. RADIATION DOSE REDUCTION: This exam was performed according to the departmental dose-optimization program which includes automated exposure control, adjustment of the mA and/or kV according to patient size and/or use of iterative reconstruction technique. COMPARISON:  December 2021 FINDINGS: CT HEAD FINDINGS Brain: No acute intracranial hemorrhage, mass effect or edema. Gray-white differentiation is preserved. Prominence of the ventricles and sulci reflects similar parenchymal volume loss. Minimal patchy hypoattenuation in the supratentorial white matter is nonspecific but may reflect similar minor chronic microvascular ischemic changes. No extra-axial collection. Vascular: There is intracranial atherosclerotic calcification at the skull base. Skull: Unremarkable. Other: Left frontal scalp hematoma. CT MAXILLOFACIAL FINDINGS Osseous: No acute facial fracture. Degenerative changes at the left temporomandibular joint. Orbits: No intraorbital hematoma. Sinuses: Mild mucosal thickening. Soft tissues: Left frontal scalp and periorbital soft tissue swelling with hematoma and laceration. IMPRESSION: No evidence of acute intracranial injury.  No acute facial fracture. Electronically Signed   By: Macy Mis M.D.    On: 10/02/2021 14:48     Assessment and Plan:   Syncope: Unexplained.  Pacemaker is functioning normally and she is not pacemaker dependent anyway (has underlying atrial fibrillation with slow ventricular response in the 40s which should be enough to prevent syncope).  No episodes of high ventricular rates have been recorded.  Consider hypotension, but she had no prodromal complaints of dizziness or lightheadedness and has been eating and drinking normally.  There has not been a recent change to her antihypertensive medications.  Would recommend reevaluating her carotid arteries.  It is possible that she had an episode of vertigo, lost consciousness after impact,  but has amnesia for the event. Permanent atrial fibrillation: On warfarin anticoagulation.  INR was actually slightly subtherapeutic during admission, although was 2.4 a couple of weeks ago.  Thankfully without any serious organ injury or intracranial bleeding.  CHA2DS2-VASc 7 (age 68, gender, hypertension, heart failure, history of stroke/TIA 2).  At this point, despite the seriousness of her fall, the benefit of anticoagulation still outweighs the risk. Pacemaker: Normal device function, thoroughly checked today.  She is not pacemaker dependent and now requires ventricular pacing roughly 60% of the time for ventricular rates less than lower rate limit of 60 bpm.  Please note that although her pacemaker generator is MRI conditional, the leads are not his ventricular lead Medtronic [id number]), therefore the system as a whole is not amenable to MRI scanning. HTN: Good blood pressure control during this hospitalization. CHF: Had a brief hospitalization in January for heart failure exacerbation.  Estimated "dry weight" is under 143 pounds.  To the hospital she reportedly weight 144 pounds so its unlikely she was dehydrated.  His weight is actually documented 151 pounds, but she does not have overt findings of hypervolemia on my exam.  Consideration for  cardiac amyloidosis, but we have decided to defer diagnostic testing in this 88 year old   Risk Assessment/Risk Scores:        New York Heart Association (NYHA) Functional Class NYHA Class II  CHA2DS2-VASc Score = 7   This indicates a 11.2% annual risk of stroke. The patient's score is based upon: CHF History: 1 HTN History: 1 Diabetes History: 0 Stroke History: 2 Vascular Disease History: 0 Age Score: 2 Gender Score: 1         For questions or updates, please contact [place] Please consult [email or web address] for contact info under    Signed, Misty Klein, MD  10/03/2021 12:58 PM

## 2021-10-03 NOTE — Progress Notes (Signed)
Progress Note   Patient: Misty Blackwell BWG:[id number] DOB: 1927-07-06 DOA: 10/02/2021     1 DOS: the patient was seen and examined on 10/03/2021   Brief hospital course: Kiahna Banghart is a 88 y.o. female with medical history significant for permanent A-fib on Coumadin, sick sinus syndrome status post pacemaker placement, iron deficiency anemia, hypertension, who presented to [facility] ED via EMS after a syncopal episode while walking out of the store of unclear etiology. No prodrome, seizure like activity.   6/3: Cardiology evaluated PM, no arrhythmia. Positive orthostatic VS with OT. Needs PT evaluation.    Assessment and Plan:  Syncope, unclear etiology Patient here for syncope reports she was at grocery store and she was walking out of the store and suddenly lost consciousness.  The next thing she remembers she was waking up with EMS.  No seizure-like activity, no tongue biting, no urinary or bowel incontinence.  She did not have a prodrome.  Called niece today who reports her daughter was with patient and patient may have had a mechanical fall.  She notes that patient may have tripped over an object and then fell to the ground.  She had recent echo, but no significant valvular disease or abnormalities that would explain her syncope.  Head CT was negative for any acute cranial findings. -Cardiology consulted, pacemaker interrogated without any arrhythmia  -recommending carotic doppler, prior CTA 2016 no significant disease -Orthostatic vital signs positive with OT  -will encourage PO intake  -PT eval: per family pt supposed to use walker but only uses cane  Left facial injury post fall from syncope Left scalp hematoma noted on CT head -Post repair in the ED -Local wound care -Analgesics PRN  Thoracic back pain CT reassuring, no fracture. Suspect MSK pain -Analgesics PRN   Right lower lobe pneumonia vs. aspiration pneumonitis New peribronchovascular nodularity and consolidation  in the right lower lobe, indicative of bronchopneumonia or aspiration.  Patient has elevated white blood count, no fever, no respiratory symptoms.  Suspected aspiration pneumonitis after her fall.  Her procalcitonin level was 0.17.  -IV azithromycin and Rocephin initiated in the ED, Stop antibiotics 6/3 - Monitor fever curve and WBC-Antitussives as needed, bronchodilators   Permanent A-fib on Coumadin Subtherapeutic INR 1.7 Pharmacy consulted to assist with management of Coumadin dosin Will not bridge for now with SQ Lovenox due to concern for re-bleeding in the setting of recent trauma to the head   Sick sinus syndrome status post pacemaker placement Pacemaker was interrogated, was unrevealing   HFpEF 60 to 65% Last 2D echo done on 05/13/2021 showed LVEF 60 to 65% with grade 2 diastolic dysfunction.Euvolemic on exam  History of breast cancer Resume home anastrozole   Iron deficiency anemia Hemoglobin stable Resume home iron supplement    DVT ppx: coumadin Code: Full MDPOA: Niece [name] Diet: HH   Subjective:   Pt reports feels okay, has some back pain. Does not want to take ibuprofen for pain. Has been taking warfarin as prescribed. Describes sensation of lightheadedness with moving head.   Called niece who reports her daughter was with the patient and noted that she was not using her walker, and believes it was a mechanical fall when patient did not pick up her foot over a object.   Physical Exam: Vitals:   10/03/21 0731 10/03/21 0735 10/03/21 1115 10/03/21 1404  BP: (!) 153/59  (!) 127/53   Pulse: 63  60   Resp: (!) 21  18   Temp: 98.1  F (36.7 C)  98.5 F (36.9 C)   TempSrc: Oral  Oral   SpO2: 96% 97% 97% 97%  Weight:      Height:       Physical Exam Vitals and nursing note reviewed.  Constitutional:      Appearance: Normal appearance. She is not ill-appearing.  HENT:     Head: Normocephalic.     Comments: Bandage over forehead    Mouth/Throat:     Mouth:  Mucous membranes are moist.  Cardiovascular:     Rate and Rhythm: Normal rate. Rhythm irregular.  Abdominal:     General: Abdomen is flat. There is no distension.     Palpations: Abdomen is soft. There is no mass.     Tenderness: There is no abdominal tenderness.  Musculoskeletal:     Right lower leg: No edema.     Left lower leg: No edema.  Skin:    General: Skin is warm and dry.     Capillary Refill: Capillary refill takes less than 2 seconds.  Neurological:     Mental Status: She is alert.  Psychiatric:        Mood and Affect: Mood normal.        Behavior: Behavior normal.    Data Reviewed:     Latest Ref Rng & Units 10/03/2021    3:49 AM 10/02/2021    2:30 PM 10/02/2021    1:56 PM  CBC  WBC 4.0 - 10.5 K/uL 13.2    16.5    Hemoglobin 12.0 - 15.0 g/dL 12.0   13.9   13.1    Hematocrit 36.0 - 46.0 % 36.2   41.0   41.3    Platelets 150 - 400 K/uL 165    186        Latest Ref Rng & Units 10/03/2021    3:49 AM 10/02/2021    2:30 PM 10/02/2021    1:56 PM  BMP  Glucose 70 - 99 mg/dL 117   118   121    BUN 8 - 23 mg/dL '18   24   20    '$ Creatinine 0.44 - 1.00 mg/dL 0.70   0.80   0.85    Sodium 135 - 145 mmol/L 137   139   140    Potassium 3.5 - 5.1 mmol/L 3.8   4.3   4.4    Chloride 98 - 111 mmol/L 105   103   105    CO2 22 - 32 mmol/L 24    25    Calcium 8.9 - 10.3 mg/dL 8.7    9.2     INR/Prothrombin Time: 1.7   Family Communication: called niece   Disposition: Status is: Inpatient Remains inpatient appropriate because: waiting on additional studies per cardiology recommendation  Planned Discharge Destination: Home with Home Health    Time spent: 45 minutes  Author: Lorelei Pont, MD 10/03/2021 2:18 PM  For on call review [email or web address].

## 2021-10-03 NOTE — Evaluation (Addendum)
Physical Therapy Evaluation Patient Details Name: Misty Blackwell MRN: [id number] DOB: 02/10/1928 Today's Date: 10/03/2021  History of Present Illness  88 y.o. female with medical history significant for permanent A-fib on Coumadin, vertigo, sick sinus syndrome status post pacemaker placement, iron deficiency anemia, hypertension, who presented to [facility] ED via EMS after a fall when walking out of a store due to presumed syncopal episode.  Found to have facial lacerations.   Clinical Impression  Patient presents with decreased mobility due to deficits listed in PT problem list.  Currently needing min to mod A for ambulation due to painful L hip, decreased activity tolerance and new tremor possibly anxiety related.  She denies recent falls, but chart notes one other in March of this year.  Concern for tripping over items and pt with neuropathy and history of vertigo.  Too painful and anxious to attempt to assess vertigo this session.  She will benefit from skilled PT in the acute setting and from follow up STSNF level rehab prior to home.  She lives alone, and though reports niece can assist agrees she is weak and too shaky to return home at this time.  Feel she would be high fall risk with her history of vertigo, neuropathy and now increased anxiety.         Recommendations for follow up therapy are one component of a multi-disciplinary discharge planning process, led by the attending physician.  Recommendations may be updated based on patient status, additional functional criteria and insurance authorization.  Follow Up Recommendations Skilled nursing-short term rehab (<3 hours/day)    Assistance Recommended at Discharge Frequent or constant Supervision/Assistance  Patient can return home with the following  Assist for transportation;Assistance with cooking/housework;A little help with walking and/or transfers;A little help with bathing/dressing/bathroom;Help with stairs or ramp for entrance     Equipment Recommendations None recommended by PT  Recommendations for Other Services       Functional Status Assessment Patient has had a recent decline in their functional status and demonstrates the ability to make significant improvements in function in a reasonable and predictable amount of time.     Precautions / Restrictions Precautions Precautions: Fall Precaution Comments: h/o vertigo, neuropathy bilateral feet      Mobility  Bed Mobility Overal bed mobility: Needs Assistance Bed Mobility: Supine to Sit, Sit to Supine     Supine to sit: HOB elevated, Min guard Sit to supine: Min assist   General bed mobility comments: assist for balance coming to EOB and for L leg onto bed to supine    Transfers Overall transfer level: Needs assistance Equipment used: Rolling walker (2 wheels) Transfers: Sit to/from Stand Sit to Stand: Min assist, From elevated surface           General transfer comment: cues for hand placement, assist for balance to stand from EOB    Ambulation/Gait Ambulation/Gait assistance: Min assist, Mod assist Gait Distance (Feet): 15 Feet (x 2) Assistive device: Rolling walker (2 wheels) Gait Pattern/deviations: Step-through pattern, Decreased stride length       General Gait Details: to bathroom and back to bed wearing hershoes per her request.  Stairs            Wheelchair Mobility    Modified Rankin (Stroke Patients Only)       Balance Overall balance assessment: Needs assistance   Sitting balance-Leahy Scale: Fair Sitting balance - Comments: tremoring while seated EOB especially after BP measurement and noting IV was backing up; patient became anxious  and tearful that she was tremoring and attempts to calm initially unsuccessful, but improved with ambulation, but noted some tremor in ankles with static standing to pull up underwear in bathroom and to wash hands at sink   Standing balance support: Reliant on assistive device for  balance Standing balance-Leahy Scale: Poor Standing balance comment: assist for balance at sink and when pulling up underwear due to tremor                             Pertinent Vitals/Pain Pain Assessment Faces Pain Scale: Hurts even more Pain Location: L hip, L upper back, L side of face Pain Descriptors / Indicators: Sore, Tender Pain Intervention(s): Monitored during session, Repositioned, Heat applied    Home Living Family/patient expects to be discharged to:: Skilled nursing facility Living Arrangements: Alone Available Help at Discharge: Family;Neighbor;Available PRN/intermittently Type of Home: Apartment Home Access: Stairs to enter Entrance Stairs-Rails: Right;Left;Can reach both Entrance Stairs-Number of Steps: 2   Home Layout: One level Home Equipment: Cane - single point;Shower seat;Grab bars - Statistician (2 wheels);Hand held shower head Additional Comments: [name], neice can come to stay    Prior Function Prior Level of Function : Independent/Modified Independent             Mobility Comments: Does not use AD at baseline, but had a cane when at the store ADLs Comments: Ind with ADL, iADL, community mobility, med management and bill payment.     Hand Dominance   Dominant Hand: Right    Extremity/Trunk Assessment        Lower Extremity Assessment Lower Extremity Assessment: LLE deficits/detail LLE Deficits / Details: AROM and lifts antigravity, but c/o pain at hip and reports feels heavier when lifting LLE Sensation: history of peripheral neuropathy;decreased light touch    Cervical / Trunk Assessment Cervical / Trunk Assessment: Kyphotic  Communication   Communication: No difficulties;HOH  Cognition Arousal/Alertness: Awake/alert Behavior During Therapy: Anxious Overall Cognitive Status: No family/caregiver present to determine baseline cognitive functioning                                 General  Comments: A&O x 3 and follows commands well; was excessively tearful and anxious upon noticing her tremor and escalated initially despite efforts to calm her; improved with ambulation. IV backing up and leaking so RN removed during session.  SBP seated EOB 161 and pt denied light headedness        General Comments      Exercises     Assessment/Plan    PT Assessment Patient needs continued PT services  PT Problem List Decreased strength;Decreased mobility;Decreased activity tolerance;Decreased balance;Pain;Decreased knowledge of use of DME;Decreased safety awareness       PT Treatment Interventions DME instruction;Gait training;Therapeutic exercise;Patient/family education;Therapeutic activities;Balance training;Functional mobility training    PT Goals (Current goals can be found in the Care Plan section)  Acute Rehab PT Goals Patient Stated Goal: to return to independent PT Goal Formulation: With patient Time For Goal Achievement: 10/17/21 Potential to Achieve Goals: Good    Frequency Min 3X/week     Co-evaluation               AM-PAC PT "6 Clicks" Mobility  Outcome Measure Help needed turning from your back to your side while in a flat bed without using bedrails?: A Little Help needed moving from lying on  your back to sitting on the side of a flat bed without using bedrails?: A Little Help needed moving to and from a bed to a chair (including a wheelchair)?: A Little Help needed standing up from a chair using your arms (e.g., wheelchair or bedside chair)?: A Little Help needed to walk in hospital room?: A Lot Help needed climbing 3-5 steps with a railing? : Total 6 Click Score: 15    End of Session Equipment Utilized During Treatment: Gait belt Activity Tolerance: Patient limited by fatigue;Patient limited by pain Patient left: with call bell/phone within reach;in bed Nurse Communication: Mobility status PT Visit Diagnosis: Other abnormalities of gait and  mobility (R26.89);History of falling (Z91.81);Other symptoms and signs involving the nervous system (R29.898);Pain Pain - Right/Left: Left Pain - part of body: Hip    Time: 8032-1224 PT Time Calculation (min) (ACUTE ONLY): 41 min   Charges:   PT Evaluation $PT Eval Moderate Complexity: 1 Mod PT Treatments $Gait Training: 8-22 mins $Therapeutic Activity: 8-22 mins        Magda Kiel, PT Acute Rehabilitation Services MGNOI:[phone] [phone] 10/03/2021   Reginia Naas 10/03/2021, 5:22 PM

## 2021-10-03 NOTE — Progress Notes (Signed)
Pt BP is high, Dr Myna Hidalgo was made aware. PRN meds ordered with parameters. Will monitor.

## 2021-10-03 NOTE — Evaluation (Signed)
Occupational Therapy Evaluation Patient Details Name: Misty Blackwell MRN: [id number] DOB: 02-09-1928 Today's Date: 10/03/2021   History of Present Illness 88 y.o. female with medical history significant for permanent A-fib on Coumadin, vertigo, sick sinus syndrome status post pacemaker placement, iron deficiency anemia, hypertension, who presented to [facility] ED via EMS after a syncopal episode.   Clinical Impression   Patient admitted for the diagnosis above.  PTA she lives alone, and needed no assist with ADL, iADL, community mobility, and did not use an AD for daily mobility.  Primary deficits are unsteadiness, pain to head and neck, ? Orthostatic, and generalized weakness.  Currently she is needing Min A for basic mobility/transfers at RW level, and closer to Mod A for ADL completion at sit/stand level.  OT will continue efforts in the acute setting, and [place] OT is recommended at this time, depending on progress.    BP lying: 139/54 BP sit: 132/56 with c/o dizziness BP stand: 119/58 with dizziness.        Recommendations for follow up therapy are one component of a multi-disciplinary discharge planning process, led by the attending physician.  Recommendations may be updated based on patient status, additional functional criteria and insurance authorization.   Follow Up Recommendations  Home health OT    Assistance Recommended at Discharge Intermittent Supervision/Assistance  Patient can return home with the following Assist for transportation;Help with stairs or ramp for entrance;Assistance with cooking/housework    Functional Status Assessment  Patient has had a recent decline in their functional status and demonstrates the ability to make significant improvements in function in a reasonable and predictable amount of time.  Equipment Recommendations  None recommended by OT    Recommendations for Other Services  PT eval     Precautions / Restrictions Precautions Precautions:  Fall Precaution Comments: L eye swollen shut, vertigo, neuropathy to B feet Restrictions Weight Bearing Restrictions: No      Mobility Bed Mobility Overal bed mobility: Needs Assistance Bed Mobility: Supine to Sit, Sit to Supine     Supine to sit: Min assist Sit to supine: Supervision        Transfers Overall transfer level: Needs assistance   Transfers: Sit to/from Stand, Bed to chair/wheelchair/BSC Sit to Stand: Min assist Stand pivot transfers: Min assist                Balance Overall balance assessment: Needs assistance Sitting-balance support: Feet supported Sitting balance-Leahy Scale: Fair     Standing balance support: Reliant on assistive device for balance Standing balance-Leahy Scale: Poor                             ADL either performed or assessed with clinical judgement   ADL       Grooming: Wash/dry hands;Wash/dry face;Set up;Sitting           Upper Body Dressing : Minimal assistance;Sitting   Lower Body Dressing: Moderate assistance;Sit to/from stand   Toilet Transfer: Minimal assistance;Stand-pivot;BSC/3in1                   Vision Patient Visual Report: Blurring of vision;Other (comment) (L eye swollen shut)       Perception Perception Perception: Within Functional Limits   Praxis Praxis Praxis: Intact    Pertinent Vitals/Pain Pain Assessment Pain Assessment: Faces Faces Pain Scale: Hurts even more Pain Location: forehead and L eye Pain Descriptors / Indicators: Tender Pain Intervention(s): Monitored during session  Hand Dominance Right   Extremity/Trunk Assessment Upper Extremity Assessment Upper Extremity Assessment: Overall WFL for tasks assessed   Lower Extremity Assessment Lower Extremity Assessment: Defer to PT evaluation   Cervical / Trunk Assessment Cervical / Trunk Assessment: Kyphotic   Communication Communication Communication: No difficulties;HOH   Cognition  Arousal/Alertness: Awake/alert Behavior During Therapy: WFL for tasks assessed/performed Overall Cognitive Status: Within Functional Limits for tasks assessed                                       General Comments   Watch BP    Exercises     Shoulder Instructions      Home Living Family/patient expects to be discharged to:: Private residence Living Arrangements: Alone Available Help at Discharge: Family;Neighbor;Available PRN/intermittently Type of Home: Apartment Home Access: Stairs to enter Entrance Stairs-Number of Steps: 2 Entrance Stairs-Rails: Right;Left;Can reach both Home Layout: One level     Bathroom Shower/Tub: Occupational psychologist: Handicapped height Bathroom Accessibility: Yes How Accessible: Accessible via walker Home Equipment: Cane - single point;Shower seat;Grab bars - Statistician (2 wheels);Hand held shower head          Prior Functioning/Environment Prior Level of Function : Independent/Modified Independent             Mobility Comments: Does not use AD at baseline. ADLs Comments: Ind with ADL, iADL, community mobility, med management and bill payment.  Has a niece that can check on her.        OT Problem List: Decreased strength;Decreased activity tolerance;Impaired balance (sitting and/or standing);Pain;Impaired sensation      OT Treatment/Interventions:      OT Goals(Current goals can be found in the care plan section) Acute Rehab OT Goals Patient Stated Goal: return home OT Goal Formulation: With patient Time For Goal Achievement: 10/16/21 Potential to Achieve Goals: Good ADL Goals Pt Will Perform Grooming: with set-up;standing Pt Will Perform Upper Body Dressing: Independently;sitting Pt Will Perform Lower Body Dressing: with set-up;sit to/from stand Pt Will Transfer to Toilet: with modified independence;ambulating;regular height toilet  OT Frequency: Min 2X/week    Co-evaluation               AM-PAC OT "6 Clicks" Daily Activity     Outcome Measure Help from another person eating meals?: None Help from another person taking care of personal grooming?: None Help from another person toileting, which includes using toliet, bedpan, or urinal?: A Little Help from another person bathing (including washing, rinsing, drying)?: A Lot Help from another person to put on and taking off regular upper body clothing?: A Little Help from another person to put on and taking off regular lower body clothing?: A Lot 6 Click Score: 18   End of Session Equipment Utilized During Treatment: Rolling walker (2 wheels);Gait belt Nurse Communication: Mobility status  Activity Tolerance: Patient tolerated treatment well Patient left: in bed;with call bell/phone within reach  OT Visit Diagnosis: Unsteadiness on feet (R26.81);Muscle weakness (generalized) (M62.81)                Time: 1601-0932 OT Time Calculation (min): 22 min Charges:  OT General Charges $OT Visit: 1 Visit OT Evaluation $OT Eval Moderate Complexity: 1 Mod  10/03/2021  RP, OTR/L  Acute Rehabilitation Services  Office:  [phone]   Metta Clines 10/03/2021, 10:47 AM

## 2021-10-03 NOTE — Hospital Course (Addendum)
Misty Blackwell is a 88 y.o. female with medical history significant for permanent A-fib on Coumadin, sick sinus syndrome status post pacemaker placement, iron deficiency anemia, hypertension, who presented to [facility] ED via EMS after a syncopal episode while walking out of the store of unclear etiology. No prodrome, seizure like activity.   6/3: Cardiology evaluated PM, no arrhythmia. Positive orthostatic VS with OT. Waiting on PT evaluation.

## 2021-10-04 DIAGNOSIS — W19XXXA Unspecified fall, initial encounter: Secondary | ICD-10-CM

## 2021-10-04 LAB — BASIC METABOLIC PANEL
Anion gap: 7 (ref 5–15)
BUN: 15 mg/dL (ref 8–23)
CO2: 24 mmol/L (ref 22–32)
Calcium: 8.5 mg/dL — ABNORMAL LOW (ref 8.9–10.3)
Chloride: 104 mmol/L (ref 98–111)
Creatinine, Ser: 0.62 mg/dL (ref 0.44–1.00)
GFR, Estimated: >60 mL/min (ref >60)
Glucose, Bld: 99 mg/dL (ref 70–99)
Potassium: 3.9 mmol/L (ref 3.5–5.1)
Sodium: 135 mmol/L (ref 135–145)

## 2021-10-04 LAB — CBC
HCT: 34.1 % — ABNORMAL LOW (ref 36.0–46.0)
Hemoglobin: 11.3 g/dL — ABNORMAL LOW (ref 12.0–15.0)
MCH: 30.5 pg (ref 26.0–34.0)
MCHC: 33.1 g/dL (ref 30.0–36.0)
MCV: 91.9 fL (ref 80.0–100.0)
Platelets: 157 10*3/uL (ref 150–400)
RBC: 3.71 MIL/uL — ABNORMAL LOW (ref 3.87–5.11)
RDW: 12.3 % (ref 11.5–15.5)
WBC: 11.5 10*3/uL — ABNORMAL HIGH (ref 4.0–10.5)
nRBC: 0 % (ref 0.0–0.2)

## 2021-10-04 LAB — PROTIME-INR
INR: 2.2 — ABNORMAL HIGH (ref 0.8–1.2)
Prothrombin Time: 24.2 seconds — ABNORMAL HIGH (ref 11.4–15.2)

## 2021-10-04 MED ORDER — AMLODIPINE BESYLATE 5 MG PO TABS
5.0000 mg | ORAL_TABLET | Freq: Every day | ORAL | Status: DC
  Administered 2021-10-04 – 2021-10-06 (×3): 5 mg via ORAL
  Filled 2021-10-04 (×3): qty 1

## 2021-10-04 MED ORDER — HYDRALAZINE HCL 20 MG/ML IJ SOLN
10.0000 mg | Freq: Four times a day (QID) | INTRAMUSCULAR | Status: DC | PRN
  Administered 2021-10-05: 10 mg via INTRAVENOUS
  Filled 2021-10-04: qty 1

## 2021-10-04 MED ORDER — WARFARIN SODIUM 3 MG PO TABS
3.0000 mg | ORAL_TABLET | Freq: Once | ORAL | Status: AC
  Administered 2021-10-04: 3 mg via ORAL
  Filled 2021-10-04: qty 1

## 2021-10-04 NOTE — NC FL2 (Signed)
[place] LEVEL OF CARE SCREENING TOOL     IDENTIFICATION  Patient Name: Misty Blackwell Birthdate: Oct 11, 1927 Sex: female Admission Date (Current Location): 10/02/2021  [facility] and [place] Number:  Herbalist and Address:  [facility] [place] [address], [place], [place] [address]      Provider Number: [id number]  Attending Physician Name and Address:  Darliss Cheney, MD  Relative Name and Phone Number:  [name] [phone]    Current Level of Care: Hospital Recommended Level of Care: [place] Prior Approval Number:    Date Approved/Denied:   PASRR Number: [id number]  Discharge Plan: SNF    Current Diagnoses: Patient Active Problem List   Diagnosis Date Noted   Syncope 10/02/2021   Pacemaker battery depletion 08/24/2021   Longstanding persistent atrial fibrillation ([place])    Aortic atherosclerosis ([place]) by CXR on 04/03/2020 01/12/2021   Invasive ductal carcinoma of breast, female, right ([place]) 12/13/2019   Malignant neoplasm of upper-outer quadrant of right breast in female, estrogen receptor positive ([place]) 11/21/2019   Anxiety 06/18/2019   Insomnia 06/18/2019   Recurrent major 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 ([place]) 06/18/2019   SSS (sick sinus syndrome) ([place]) 09/26/2018   Abnormal glucose 08/29/2018   Neural foraminal stenosis of cervical spine 04/18/2018   History of TIA (transient ischemic attack) 04/17/2018   Chronic atrial fibrillation ([place])    Chronic diastolic heart failure ([place]) 11/10/2017   Vitamin D deficiency 08/15/2013   Pacemaker 09/19/2012   Long term current use of anticoagulant therapy 07/18/2012   Hyperlipidemia, mixed 09/03/2008   Essential hypertension 09/03/2008   Coronary atherosclerosis 09/03/2008   GERD 09/03/2008   FIBROCYSTIC BREAST DISEASE 09/03/2008   Osteoarthritis 09/03/2008    Orientation RESPIRATION BLADDER Height & Weight     Self, Time, Situation, Place  Normal  Continent, External catheter Weight: 153 lb 14.1 oz (69.8 kg) Height:  '5\' 4"'$  (162.6 cm)  BEHAVIORAL SYMPTOMS/MOOD NEUROLOGICAL BOWEL NUTRITION STATUS      Continent Diet (See DC summary)  AMBULATORY STATUS COMMUNICATION OF NEEDS Skin   Limited Assist Verbally Bruising, Other (Comment) (lacerations, face and hand)                       Personal Care Assistance Level of Assistance  Bathing, Feeding, Dressing Bathing Assistance: Limited assistance Feeding assistance: Independent Dressing Assistance: Limited assistance     Functional Limitations Info  Sight, Hearing, Speech Sight Info: Impaired Hearing Info: Impaired Speech Info: Adequate    SPECIAL CARE FACTORS FREQUENCY  PT (By licensed PT), OT (By licensed OT)     PT Frequency: 5x per week OT Frequency: 5x per week            Contractures Contractures Info: Not present    Additional Factors Info  Code Status, Allergies Code Status Info: Full Allergies Info: Latex, Ace Inhibitors, Augmentin (Amoxicillin-pot Clavulanate), Ciprofloxacin, Levaquin (Levofloxacin In D5w), Zocor (Simvastatin), Acrylic Polymer (Carbomer), Chocolate, Gabapentin           Current Medications (10/04/2021):  This is the current hospital active medication list Current Facility-Administered Medications  Medication Dose Route Frequency Provider Last Rate Last Admin   acetaminophen (TYLENOL) tablet 650 mg  650 mg Oral Q6H PRN Irene Pap N, DO   650 mg at 10/04/21 0808   acidophilus (RISAQUAD) capsule 1 capsule  1 capsule Oral Daily Irene Pap N, DO   1 capsule at 10/04/21 1017   amLODipine (NORVASC) tablet  5 mg  5 mg Oral Daily Darliss Cheney, MD   5 mg at 10/04/21 1017   anastrozole (ARIMIDEX) tablet 1 mg  1 mg Oral Daily Irene Pap N, DO   1 mg at 10/04/21 1017   ascorbic acid (VITAMIN C) tablet 1,000 mg  1,000 mg Oral Daily Irene Pap N, DO   1,000 mg at 10/04/21 1017   cholecalciferol (VITAMIN D3) tablet 5,000 Units  5,000 Units Oral  Daily Kayleen Memos, DO   5,000 Units at 10/04/21 1016   clotrimazole (LOTRIMIN) 1 % cream   Topical BID Irene Pap N, DO   Given at 10/04/21 1017   ferrous sulfate tablet 325 mg  325 mg Oral Q breakfast Kayleen Memos, DO   325 mg at 10/04/21 1017   guaiFENesin-dextromethorphan (ROBITUSSIN DM) 100-10 MG/5ML syrup 5 mL  5 mL Oral Q4H PRN Irene Pap N, DO       hydrALAZINE (APRESOLINE) injection 10 mg  10 mg Intravenous Q6H PRN Darliss Cheney, MD       hydrOXYzine (ATARAX) tablet 100 mg  100 mg Oral QHS Irene Pap N, DO   100 mg at 10/02/21 2132   ipratropium-albuterol (DUONEB) 0.5-2.5 (3) MG/3ML nebulizer solution 3 mL  3 mL Nebulization Q6H PRN Lorelei Pont, MD       irbesartan (AVAPRO) tablet 300 mg  300 mg Oral Daily Irene Pap N, DO   300 mg at 10/04/21 1017   melatonin tablet 5 mg  5 mg Oral QHS PRN Irene Pap N, DO       ondansetron (ZOFRAN) injection 4 mg  4 mg Intravenous Q6H PRN Hall, Carole N, DO       polyethylene glycol (MIRALAX / GLYCOLAX) packet 17 g  17 g Oral Daily PRN Irene Pap N, DO       vitamin B-12 (CYANOCOBALAMIN) tablet 1,000 mcg  1,000 mcg Oral Daily Irene Pap N, DO   1,000 mcg at 10/04/21 1017   warfarin (COUMADIN) tablet 3 mg  3 mg Oral ONCE-1600 Pauletta Browns, [facility]       Warfarin - Pharmacist Dosing Inpatient   Does not apply q1600 Kayleen Memos, DO         Discharge Medications: Please see discharge summary for a list of discharge medications.  Relevant Imaging Results:  Relevant Lab Results:   Additional Information SS# [id number]  Bary Castilla, LCSW

## 2021-10-04 NOTE — Progress Notes (Signed)
Progress Note  Patient Name: Misty Blackwell Date of Encounter: 10/04/2021  [facility] Cardiologist: Sanda Klein, MD   Subjective   No arrhythmia other than chronic AFib on telemetry. Awaiting carotid US  Inpatient Medications    Scheduled Meds:  acidophilus  1 capsule Oral Daily   amLODipine  5 mg Oral Daily   anastrozole  1 mg Oral Daily   vitamin C  1,000 mg Oral Daily   cholecalciferol  5,000 Units Oral Daily   clotrimazole   Topical BID   ferrous sulfate  325 mg Oral Q breakfast   hydrOXYzine  100 mg Oral QHS   irbesartan  300 mg Oral Daily   vitamin B-12  1,000 mcg Oral Daily   warfarin  3 mg Oral ONCE-1600   Warfarin - Pharmacist Dosing Inpatient   Does not apply q1600   Continuous Infusions:  PRN Meds: acetaminophen, guaiFENesin-dextromethorphan, hydrALAZINE, ipratropium-albuterol, melatonin, ondansetron (ZOFRAN) IV, polyethylene glycol   Vital Signs    Vitals:   10/03/21 2315 10/04/21 0442 10/04/21 0712 10/04/21 0801  BP: (!) 159/72 (!) 164/72 (!) 183/74 (!) 191/72  Pulse: 86 63 76   Resp: 19 19 (!) 24   Temp: 98.8 F (37.1 C) 98.6 F (37 C) 98.8 F (37.1 C)   TempSrc: Oral Oral Oral   SpO2: 94% 92% 93%   Weight:  69.8 kg    Height:        Intake/Output Summary (Last 24 hours) at 10/04/2021 0957 Last data filed at 10/04/2021 0800 Gross per 24 hour  Intake 480 ml  Output 250 ml  Net 230 ml      10/04/2021    4:42 AM 10/03/2021    4:03 AM 10/02/2021    9:18 PM  Last 3 Weights  Weight (lbs) 153 lb 14.1 oz 150 lb 12.7 oz 147 lb 14.9 oz  Weight (kg) 69.8 kg 68.4 kg 67.1 kg      Telemetry    AFib with controlled rates - Personally Reviewed  ECG    No new tracing - Personally Reviewed  Physical Exam  Left periorbital hematoma GEN: No acute distress.   Neck: No JVD Cardiac: irregular, faint aortic ejection murmur, no diastolic murmurs, rubs, or gallops. L subclavian PPM. Respiratory: Clear to auscultation bilaterally. GI: Soft,  nontender, non-distended  MS: No edema; No deformity. Neuro:  Nonfocal  Psych: Normal affect   Labs    High Sensitivity Troponin:   Recent Labs  Lab 10/02/21 1733 10/02/21 1930  TROPONINIHS 13 15     Chemistry Recent Labs  Lab 10/02/21 1356 10/02/21 1430 10/03/21 0349 10/04/21 0435  NA 140 139 137 135  K 4.4 4.3 3.8 3.9  CL 105 103 105 104  CO2 25  --  24 24  GLUCOSE 121* 118* 117* 99  BUN 20 24* 18 15  CREATININE 0.85 0.80 0.70 0.62  CALCIUM 9.2  --  8.7* 8.5*  MG  --   --  2.0  --   PROT 7.2  --  6.2*  --   ALBUMIN 3.8  --  3.3*  --   AST 20  --  17  --   ALT 15  --  13  --   ALKPHOS 106  --  90  --   BILITOT 1.1  --  0.8  --   GFRNONAA >60  --  >60 >60  ANIONGAP 10  --  8 7    Lipids No results for input(s): CHOL, TRIG,  HDL, LABVLDL, LDLCALC, CHOLHDL in the last 168 hours.  Hematology Recent Labs  Lab 10/02/21 1356 10/02/21 1430 10/03/21 0349 10/04/21 0435  WBC 16.5*  --  13.2* 11.5*  RBC 4.42  --  3.92 3.71*  HGB 13.1 13.9 12.0 11.3*  HCT 41.3 41.0 36.2 34.1*  MCV 93.4  --  92.3 91.9  MCH 29.6  --  30.6 30.5  MCHC 31.7  --  33.1 33.1  RDW 12.1  --  12.5 12.3  PLT 186  --  165 157   Thyroid No results for input(s): TSH, FREET4 in the last 168 hours.  BNPNo results for input(s): BNP, PROBNP in the last 168 hours.  DDimer No results for input(s): DDIMER in the last 168 hours.   Radiology    CT Head Wo Contrast  Result Date: 10/02/2021 CLINICAL DATA:  Head trauma, moderate-severe EXAM: CT HEAD WITHOUT CONTRAST CT MAXILLOFACIAL WITHOUT CONTRAST TECHNIQUE: Multidetector CT imaging of the head and maxillofacial structures were performed using the standard protocol without intravenous contrast. Multiplanar CT image reconstructions of the maxillofacial structures were also generated. RADIATION DOSE REDUCTION: This exam was performed according to the departmental dose-optimization program which includes automated exposure control, adjustment of the mA and/or  kV according to patient size and/or use of iterative reconstruction technique. COMPARISON:  December 2021 FINDINGS: CT HEAD FINDINGS Brain: No acute intracranial hemorrhage, mass effect or edema. Gray-white differentiation is preserved. Prominence of the ventricles and sulci reflects similar parenchymal volume loss. Minimal patchy hypoattenuation in the supratentorial white matter is nonspecific but may reflect similar minor chronic microvascular ischemic changes. No extra-axial collection. Vascular: There is intracranial atherosclerotic calcification at the skull base. Skull: Unremarkable. Other: Left frontal scalp hematoma. CT MAXILLOFACIAL FINDINGS Osseous: No acute facial fracture. Degenerative changes at the left temporomandibular joint. Orbits: No intraorbital hematoma. Sinuses: Mild mucosal thickening. Soft tissues: Left frontal scalp and periorbital soft tissue swelling with hematoma and laceration. IMPRESSION: No evidence of acute intracranial injury.  No acute facial fracture. Electronically Signed   By: Macy Mis M.D.   On: 10/02/2021 14:48   CT Cervical Spine Wo Contrast  Result Date: 10/02/2021 CLINICAL DATA:  Polytrauma, blunt; Low back pain, trauma; Mid-back pain. EXAM: CT CERVICAL, THORACIC, AND LUMBAR SPINE WITHOUT CONTRAST TECHNIQUE: Multidetector CT imaging of the cervical, thoracic and lumbar spine was performed without intravenous contrast. Multiplanar CT image reconstructions were also generated. RADIATION DOSE REDUCTION: This exam was performed according to the departmental dose-optimization program which includes automated exposure control, adjustment of the mA and/or kV according to patient size and/or use of iterative reconstruction technique. COMPARISON:  CT angiography chest 05/12/2021, CT cervical spine 04/05/2020 FINDINGS: CT CERVICAL SPINE FINDINGS Alignment: Normal. Skull base and vertebrae: Multilevel degenerative changes spine most prominent at the C4-C5 level. Associated  severe right osseous neural foraminal stenosis at the C4-C5 level. No severe osseous central canal stenosis. No acute fracture. No aggressive appearing focal osseous lesion or focal pathologic process. Soft tissues and spinal canal: No prevertebral fluid or swelling. No visible canal hematoma. Upper chest: Biapical pleural/pulmonary scarring. Other: None. CT THORACIC SPINE FINDINGS Alignment: Normal. Vertebrae: No acute fracture or focal pathologic process. Paraspinal and other soft tissues: Negative. Disc levels: Multilevel neural intervertebral disc space vacuum phenomenon. Otherwise maintained. CT LUMBAR SPINE FINDINGS Segmentation: 5 lumbar type vertebrae. Alignment: Levocurvature of the lumbar spine centered at the L2-L3 level may be positional. Vertebrae: Multilevel osteophyte formation and facet arthropathy. Multilevel posterior disc osteophyte complex. No severe osseous neural foraminal or  central canal stenosis. No acute fracture or focal pathologic process. Paraspinal and other soft tissues: Negative. Disc levels: Multilevel intervertebral disc space narrowing and vacuum phenomenon. Other: Atherosclerotic plaque. IMPRESSION: 1. No acute displaced fracture or traumatic listhesis of the cervical spine. 2. No acute displaced fracture or traumatic listhesis of the thoracic spine. 3. No acute displaced fracture or traumatic listhesis of the lumbar spine. 4. Severe osseous neural foraminal stenosis at the right C4-C5 level. 5. Please see separately dictated CT head, chest, abdomen, pelvis 10/02/2021. 6.  Aortic Atherosclerosis (ICD10-I70.0). Electronically Signed   By: Iven Finn M.D.   On: 10/02/2021 15:20   CT Lumbar Spine Wo Contrast  Result Date: 10/02/2021 CLINICAL DATA:  Polytrauma, blunt; Low back pain, trauma; Mid-back pain. EXAM: CT CERVICAL, THORACIC, AND LUMBAR SPINE WITHOUT CONTRAST TECHNIQUE: Multidetector CT imaging of the cervical, thoracic and lumbar spine was performed without  intravenous contrast. Multiplanar CT image reconstructions were also generated. RADIATION DOSE REDUCTION: This exam was performed according to the departmental dose-optimization program which includes automated exposure control, adjustment of the mA and/or kV according to patient size and/or use of iterative reconstruction technique. COMPARISON:  CT angiography chest 05/12/2021, CT cervical spine 04/05/2020 FINDINGS: CT CERVICAL SPINE FINDINGS Alignment: Normal. Skull base and vertebrae: Multilevel degenerative changes spine most prominent at the C4-C5 level. Associated severe right osseous neural foraminal stenosis at the C4-C5 level. No severe osseous central canal stenosis. No acute fracture. No aggressive appearing focal osseous lesion or focal pathologic process. Soft tissues and spinal canal: No prevertebral fluid or swelling. No visible canal hematoma. Upper chest: Biapical pleural/pulmonary scarring. Other: None. CT THORACIC SPINE FINDINGS Alignment: Normal. Vertebrae: No acute fracture or focal pathologic process. Paraspinal and other soft tissues: Negative. Disc levels: Multilevel neural intervertebral disc space vacuum phenomenon. Otherwise maintained. CT LUMBAR SPINE FINDINGS Segmentation: 5 lumbar type vertebrae. Alignment: Levocurvature of the lumbar spine centered at the L2-L3 level may be positional. Vertebrae: Multilevel osteophyte formation and facet arthropathy. Multilevel posterior disc osteophyte complex. No severe osseous neural foraminal or central canal stenosis. No acute fracture or focal pathologic process. Paraspinal and other soft tissues: Negative. Disc levels: Multilevel intervertebral disc space narrowing and vacuum phenomenon. Other: Atherosclerotic plaque. IMPRESSION: 1. No acute displaced fracture or traumatic listhesis of the cervical spine. 2. No acute displaced fracture or traumatic listhesis of the thoracic spine. 3. No acute displaced fracture or traumatic listhesis of the  lumbar spine. 4. Severe osseous neural foraminal stenosis at the right C4-C5 level. 5. Please see separately dictated CT head, chest, abdomen, pelvis 10/02/2021. 6.  Aortic Atherosclerosis (ICD10-I70.0). Electronically Signed   By: Iven Finn M.D.   On: 10/02/2021 15:20   DG Pelvis Portable  Result Date: 10/02/2021 CLINICAL DATA:  Fall, level 2 trauma. EXAM: PORTABLE PELVIS 1-2 VIEWS COMPARISON:  None Available. FINDINGS: There is no evidence of pelvic fracture or diastasis. No pelvic bone lesions are seen. IMPRESSION: No acute osseous abnormality. Electronically Signed   By: Dahlia Bailiff M.D.   On: 10/02/2021 14:25   CT CHEST ABDOMEN PELVIS W CONTRAST  Result Date: 10/02/2021 CLINICAL DATA:  Fall with chest pain and abdominal pain, on Coumadin. EXAM: CT CHEST, ABDOMEN, AND PELVIS WITH CONTRAST TECHNIQUE: Multidetector CT imaging of the chest, abdomen and pelvis was performed following the standard protocol during bolus administration of intravenous contrast. RADIATION DOSE REDUCTION: This exam was performed according to the departmental dose-optimization program which includes automated exposure control, adjustment of the mA and/or kV according to patient size  and/or use of iterative reconstruction technique. CONTRAST:  93m OMNIPAQUE IOHEXOL 300 MG/ML  SOLN COMPARISON:  CT chest 05/12/2021. FINDINGS: CT CHEST FINDINGS Cardiovascular: Atherosclerotic calcification of the aorta and coronary arteries. Enlarged pulmonic trunk and heart. No pericardial effusion. Mediastinum/Nodes: 1.8 cm minimally hypodense nodule in the right thyroid. No follow-up recommended unless clinically warranted. (Ref: J Am Coll Radiol. 2015 Feb;12(2): 143-50). Mediastinal lymph nodes are not enlarged by CT size criteria. No hilar or axillary adenopathy. Esophagus is grossly unremarkable. Lungs/Pleura: Pulmonary nodules measure up to 6 mm in the right middle lobe (4/116), as on 05/12/2021. Patchy peribronchovascular nodularity and  consolidation in the right lower lobe, new. No pleural fluid. Airway is unremarkable. Musculoskeletal: Degenerative changes in the spine.  No fracture. CT ABDOMEN PELVIS FINDINGS Hepatobiliary: Liver margin is mildly irregular. Cholecystectomy. No biliary ductal dilatation. Pancreas: Negative. Spleen: Negative. Adrenals/Urinary Tract: Adrenal glands are unremarkable. Parenchymal thinning and scarring in the kidneys. Subcentimeter lesions in the left kidney, too small to characterize. No specific follow-up necessary. Ureters are decompressed. Bladder is grossly unremarkable. Stomach/Bowel: Stomach, small bowel and colon are unremarkable. Appendix is not readily visualized. Vascular/Lymphatic: Atherosclerotic calcification of the aorta. No pathologically enlarged lymph nodes. Reproductive: Hysterectomy.  No adnexal mass. Other: No free fluid.  Mesenteries and peritoneum are unremarkable. Musculoskeletal: Degenerative changes in the spine. No worrisome lytic or sclerotic lesions. IMPRESSION: 1. No evidence of acute trauma to the chest, abdomen or pelvis. 2. New peribronchovascular nodularity and consolidation in the right lower lobe, indicative of bronchopneumonia or aspiration. Difficult to definitively exclude pulmonary contusion. 3. Mild marginal regulated the liver raises suspicion for cirrhosis. 4.  Aortic atherosclerosis (ICD10-I70.0). Electronically Signed   By: MLorin PicketM.D.   On: 10/02/2021 15:21   CT T-SPINE NO CHARGE  Result Date: 10/02/2021 CLINICAL DATA:  Polytrauma, blunt; Low back pain, trauma; Mid-back pain. EXAM: CT CERVICAL, THORACIC, AND LUMBAR SPINE WITHOUT CONTRAST TECHNIQUE: Multidetector CT imaging of the cervical, thoracic and lumbar spine was performed without intravenous contrast. Multiplanar CT image reconstructions were also generated. RADIATION DOSE REDUCTION: This exam was performed according to the departmental dose-optimization program which includes automated exposure control,  adjustment of the mA and/or kV according to patient size and/or use of iterative reconstruction technique. COMPARISON:  CT angiography chest 05/12/2021, CT cervical spine 04/05/2020 FINDINGS: CT CERVICAL SPINE FINDINGS Alignment: Normal. Skull base and vertebrae: Multilevel degenerative changes spine most prominent at the C4-C5 level. Associated severe right osseous neural foraminal stenosis at the C4-C5 level. No severe osseous central canal stenosis. No acute fracture. No aggressive appearing focal osseous lesion or focal pathologic process. Soft tissues and spinal canal: No prevertebral fluid or swelling. No visible canal hematoma. Upper chest: Biapical pleural/pulmonary scarring. Other: None. CT THORACIC SPINE FINDINGS Alignment: Normal. Vertebrae: No acute fracture or focal pathologic process. Paraspinal and other soft tissues: Negative. Disc levels: Multilevel neural intervertebral disc space vacuum phenomenon. Otherwise maintained. CT LUMBAR SPINE FINDINGS Segmentation: 5 lumbar type vertebrae. Alignment: Levocurvature of the lumbar spine centered at the L2-L3 level may be positional. Vertebrae: Multilevel osteophyte formation and facet arthropathy. Multilevel posterior disc osteophyte complex. No severe osseous neural foraminal or central canal stenosis. No acute fracture or focal pathologic process. Paraspinal and other soft tissues: Negative. Disc levels: Multilevel intervertebral disc space narrowing and vacuum phenomenon. Other: Atherosclerotic plaque. IMPRESSION: 1. No acute displaced fracture or traumatic listhesis of the cervical spine. 2. No acute displaced fracture or traumatic listhesis of the thoracic spine. 3. No acute displaced fracture or traumatic  listhesis of the lumbar spine. 4. Severe osseous neural foraminal stenosis at the right C4-C5 level. 5. Please see separately dictated CT head, chest, abdomen, pelvis 10/02/2021. 6.  Aortic Atherosclerosis (ICD10-I70.0). Electronically Signed   By:  Iven Finn M.D.   On: 10/02/2021 15:20   DG Chest Port 1 View  Result Date: 10/02/2021 CLINICAL DATA:  Fall EXAM: PORTABLE CHEST 1 VIEW COMPARISON:  05/12/2021 FINDINGS: Chronic interstitial changes. No new consolidation or edema. No pleural effusion or pneumothorax. Stable cardiomegaly. Left chest wall dual lead pacemaker. Included osseous structures are grossly intact. IMPRESSION: No acute process in the chest. Electronically Signed   By: Macy Mis M.D.   On: 10/02/2021 15:06   CT Maxillofacial Wo Contrast  Result Date: 10/02/2021 CLINICAL DATA:  Head trauma, moderate-severe EXAM: CT HEAD WITHOUT CONTRAST CT MAXILLOFACIAL WITHOUT CONTRAST TECHNIQUE: Multidetector CT imaging of the head and maxillofacial structures were performed using the standard protocol without intravenous contrast. Multiplanar CT image reconstructions of the maxillofacial structures were also generated. RADIATION DOSE REDUCTION: This exam was performed according to the departmental dose-optimization program which includes automated exposure control, adjustment of the mA and/or kV according to patient size and/or use of iterative reconstruction technique. COMPARISON:  December 2021 FINDINGS: CT HEAD FINDINGS Brain: No acute intracranial hemorrhage, mass effect or edema. Gray-white differentiation is preserved. Prominence of the ventricles and sulci reflects similar parenchymal volume loss. Minimal patchy hypoattenuation in the supratentorial white matter is nonspecific but may reflect similar minor chronic microvascular ischemic changes. No extra-axial collection. Vascular: There is intracranial atherosclerotic calcification at the skull base. Skull: Unremarkable. Other: Left frontal scalp hematoma. CT MAXILLOFACIAL FINDINGS Osseous: No acute facial fracture. Degenerative changes at the left temporomandibular joint. Orbits: No intraorbital hematoma. Sinuses: Mild mucosal thickening. Soft tissues: Left frontal scalp and  periorbital soft tissue swelling with hematoma and laceration. IMPRESSION: No evidence of acute intracranial injury.  No acute facial fracture. Electronically Signed   By: Macy Mis M.D.   On: 10/02/2021 14:48    Cardiac Studies    1. Left ventricular ejection fraction, by estimation, is 60 to 65%. The  left ventricle has normal function. The left ventricle has no regional  wall motion abnormalities. There is mild concentric left ventricular  hypertrophy. Left ventricular diastolic  parameters are consistent with Grade II diastolic dysfunction  (pseudonormalization).   2. Right ventricular systolic function is mildly reduced. The right  ventricular size is normal. There is mildly elevated pulmonary artery  systolic pressure.   3. Left atrial size was mildly dilated.   4. Right atrial size was moderately dilated.   5. The mitral valve is normal in structure. Mild mitral valve  regurgitation. No evidence of mitral stenosis.   6. The aortic valve is calcified. Aortic valve regurgitation is mild.  Aortic valve sclerosis/calcification is present, without any evidence of  aortic stenosis. Aortic regurgitation PHT measures 503 msec. Aortic valve  area, by VTI measures 2.10 cm.  Aortic valve mean gradient measures 5.0 mmHg. Aortic valve Vmax measures  1.60 m/s.   7. The inferior vena cava is normal in size with greater than 50%  respiratory variability, suggesting right atrial pressure of 3 mmHg.   Patient Profile     88 y.o. female with permanent atrial fibrillation with controlled ventricular response and dual-chamber permanent pacemaker programmed VVIR, on chronic warfarin anticoagulation, admitted following suspected syncopal event associated with head injury, but without intracranial hemorrhage.  Assessment & Plan    Syncope: No arrhythmia to explain  this.  She is not pacemaker dependent.  The pacemaker is functioning normally and has not recorded any episodes of high ventricular  rates.  It possible that she had hypotension, although she did not have any prodromal symptoms.  She has been recently having a lot of problems with vertigo.  It is possible that she lost her balance and fell and has amnesia for the event.  He did have a previous fall in her home a couple of months ago, also with head impact.  Note physical therapy evaluation and plans for rehab.  Also waiting for carotid duplex ultrasonography. Permanent atrial fibrillation: On warfarin anticoagulation.  INR was actually slightly subtherapeutic during this admission, although was therapeutic at 2.4 a couple of weeks ago.  Thankfully without any serious organ injury or intracranial bleeding.  CHA2DS2-VASc 7 (age 74, gender, hypertension, heart failure, history of stroke/TIA 2).  At this point, despite the seriousness of her fall, the benefit of anticoagulation still outweighs the risk. Pacemaker: Device checked comprehensively yesterday.  Normal function.  She is not pacemaker dependent and now requires ventricular pacing roughly 60% of the time for ventricular rates less than lower rate limit of 60 bpm.  Please note that although her pacemaker generator is MRI conditional, the leads are not:  ventricular lead [employer] [id number]), therefore the system as a whole is not amenable to MRI scanning. HTN: Blood pressure was well controlled on admission, but is now increasingly high.  Seemed to have reasonable control of blood pressure on her home regimen of irbesartan and diltiazem CHF: Had a brief hospitalization in January for heart failure exacerbation.  Estimated "dry weight" is under 143 pounds.  To the hospital she reportedly weight 144 pounds so its unlikely she was dehydrated.  Current weight is actually documented 154 pounds, but she does not have overt findings of hypervolemia on my exam.  Consideration for cardiac amyloidosis, but we have decided to defer diagnostic testing in this 88 year old. [facility] will sign off.    Medication Recommendations: Resume previous cardiac medications at discharge Other recommendations (labs, testing, etc): Awaiting carotid duplex ultrasound Follow up as an outpatient: Has an appointment scheduled for 11/30/2021 in the office, as well as a follow-up visit in Coumadin clinic in early July.  For questions or updates, please contact [place] Please consult [email or web address] for contact info under        Signed, Sanda Klein, MD  10/04/2021, 9:57 AM   .

## 2021-10-04 NOTE — Progress Notes (Signed)
ANTICOAGULATION CONSULT NOTE - Initial Consult  Pharmacy Consult for warfarin Indication: atrial fibrillation  Allergies  Allergen Reactions   Latex Itching   Ace Inhibitors Other (See Comments)    Unknown reaction   Augmentin [Amoxicillin-Pot Clavulanate] Other (See Comments)    Unknown   Ciprofloxacin Other (See Comments)    Unknown    Levaquin [Levofloxacin In D5w] Other (See Comments)    Unknown   Zocor [Simvastatin] Other (See Comments)    Unknown    Acrylic Polymer [Carbomer] Itching   Chocolate Other (See Comments)    Migraines    Gabapentin Other (See Comments)    Unsteady gait     Patient Measurements: Height: '5\' 4"'$  (162.6 cm) Weight: 69.8 kg (153 lb 14.1 oz) IBW/kg (Calculated) : 54.7  Vital Signs: Temp: 98.8 F (37.1 C) (06/04 0712) Temp Source: Oral (06/04 0712) BP: 191/72 (06/04 0801) Pulse Rate: 76 (06/04 0712)  Labs: Recent Labs    10/02/21 1356 10/02/21 1430 10/02/21 1733 10/02/21 1930 10/03/21 0349 10/04/21 0435  HGB 13.1 13.9  --   --  12.0 11.3*  HCT 41.3 41.0  --   --  36.2 34.1*  PLT 186  --   --   --  165 157  LABPROT 19.8*  --   --   --  19.9* 24.2*  INR 1.7*  --   --   --  1.7* 2.2*  CREATININE 0.85 0.80  --   --  0.70 0.62  TROPONINIHS  --   --  13 15  --   --      Estimated Creatinine Clearance: 43 mL/min (by C-G formula based on SCr of 0.62 mg/dL).   Medical History: Past Medical History:  Diagnosis Date   Anxiety    Atrial fib/flutter, transient    Atrial fibrillation, chronic (HCC)    CHF (congestive heart failure) ([place]) 10/29/2009   Echo - EF >55%; normal LV size and systolic function; unable to assess diastolic fcn due to E/A fusion, pulmonary vein flow pattern suggests elevated filling pressure; marked biatrail dilation, mild/mod tricuspid regurgitation; mod pulmonary htn; mild/mod mitral regurgitation; although echocardiographic features are incomplete findings suggest possible infiltrative cardiomyopathy (maybe  amyloidosi   Coronary artery disease 03/19/2002   R/P Cardiolite - EF 76%; nromal static and dynamic myocardial perfusion images; normal wall motion and endocardial thickening in all vascular territories   Dyspnea    Dysrhythmia    Facial numbness 12/26/2008   carotid doppler - R and L ICAs 0-49% diameter reduction (velocities suggest low end of scale)   Hypertension    Pacemaker    Peripheral neuropathy    Pneumonia 2017   Skin cancer    s/p surgical removal.   Stroke ([place]) 04/2017   TIA (transient ischemic attack)     Medications:  Medications Prior to Admission  Medication Sig Dispense Refill Last Dose   acetaminophen (TYLENOL) 325 MG tablet Take 325-650 mg by mouth every 6 (six) hours as needed for mild pain or headache.   unk   acidophilus (RISAQUAD) CAPS capsule Take 1 capsule by mouth daily.   10/02/2021   anastrozole (ARIMIDEX) 1 MG tablet TAKE 1 TABLET(1 MG) BY MOUTH DAILY (Patient taking differently: Take 1 mg by mouth daily.) 90 tablet 3 10/02/2021   Ascorbic Acid (VITAMIN C) 1000 MG tablet Take 1,000 mg by mouth daily.    10/02/2021   Cholecalciferol (VITAMIN D-3) 125 MCG (5000 UT) TABS Take 5,000 Units by mouth daily.   10/02/2021  clotrimazole-betamethasone (LOTRISONE) cream APPLY TOPICALLY TO THE AFFECTED AREA TWICE DAILY (Patient taking differently: Apply 1 application. topically 2 (two) times daily as needed (to affected area).) 45 g 3 unk   Cyanocobalamin (VITAMIN B 12 PO) Take 1,000 mcg by mouth daily.    10/02/2021 at am   diltiazem (CARDIZEM CD) 240 MG 24 hr capsule Take 1 capsule (240 mg total) by mouth daily. 30 capsule 6 10/02/2021 at am   Ferrous Sulfate 90 (18 Fe) MG TABS Take 18 mg by mouth daily with breakfast.   10/02/2021   furosemide (LASIX) 20 MG tablet Take 2 tablets (40 mg total) by mouth daily. (Patient taking differently: Take 20 mg by mouth in the morning and at bedtime.) 30 tablet 11 10/02/2021   hydrOXYzine (ATARAX) 50 MG tablet TAKE 1 TO 2 TABLETS BY MOUTH EVERY  NIGHT 1 HOUR BEFORE BEDTIME AS NEEDED FOR SLEEP (Patient taking differently: Take 100 mg by mouth at bedtime.) 60 tablet 2 10/01/2021 at pm   irbesartan (AVAPRO) 300 MG tablet TAKE 1 TABLET(300 MG) BY MOUTH DAILY (Patient taking differently: Take 300 mg by mouth daily.) 90 tablet 0 10/02/2021   Magnesium 250 MG TABS Take 250 mg by mouth daily.   10/02/2021   OVER THE COUNTER MEDICATION Apply 1 application. topically See admin instructions. Neuropathy maximum strength Nerve relief and recovery cream- Apply to the feet at bedtime   10/01/2021 at pm   potassium chloride SA (KLOR-CON M) 20 MEQ tablet Take  1 tablet  2 x /day  for Potassium (Patient taking differently: Take 20 mEq by mouth in the morning and at bedtime.) 180 tablet 1 10/02/2021   warfarin (COUMADIN) 3 MG tablet TAKE 1 TO 1 AND 1/2 TABLET BY MOUTH AS DIRECTED 135 tablet 1 10/01/2021 at 1700   zinc gluconate 50 MG tablet Take 50 mg by mouth daily.   10/02/2021   pregabalin (LYRICA) 25 MG capsule Take 1 capsule (25 mg total) by mouth 2 (two) times daily. (Patient not taking: Reported on 10/02/2021) 60 capsule 0 Not Taking   Scheduled:   acidophilus  1 capsule Oral Daily   anastrozole  1 mg Oral Daily   vitamin C  1,000 mg Oral Daily   cholecalciferol  5,000 Units Oral Daily   clotrimazole   Topical BID   ferrous sulfate  325 mg Oral Q breakfast   hydrOXYzine  100 mg Oral QHS   irbesartan  300 mg Oral Daily   vitamin B-12  1,000 mcg Oral Daily   Warfarin - Pharmacist Dosing Inpatient   Does not apply q1600   Infusions:    PRN: acetaminophen, guaiFENesin-dextromethorphan, hydrALAZINE, ipratropium-albuterol, melatonin, ondansetron (ZOFRAN) IV, polyethylene glycol  Assessment: 78 yof with a history of AF w/ slow ventriular response and medtronic PM, TIA, HTN, CAD, HF presenting after a syncopal episode in store parking lot. Presented to ED with forehead laceration. Warfarin per pharmacy consult placed for AF. CT head with no acute injury.  Patient  takes warfarin prior to arrival. Home dose is 3 mg (3 mg x 1) every Sun, Tue, Thu; 4.5 mg (3 mg x 1.5) all other days. Warfarin last taken 6/1 per patient's medication history.  INR is therapeutic at 2.2. Azithromycin has been discontinued. Patient eating 25-75% of meals. Per outpatient anticoagulation summary, time in range is 75.1% (9 years), will attempt to resume PTA dosing schedule now that INR is therapeutic.   Goal of Therapy:  INR 2-3 Monitor platelets by anticoagulation protocol: Yes  Plan:  Will not bridge for now as concerned for re-bleeding Warfarin 3 mg x1 Monitor daily INR and H/H Monitor for s/s of bleeding  Joseph Art, Pharm.D. PGY-1 Pharmacy Resident [phone] 10/04/2021 8:29 AM

## 2021-10-04 NOTE — Progress Notes (Signed)
PROGRESS NOTE    Misty Blackwell  WPY:[id number] DOB: [birth year] DOA: 10/02/2021 PCP: [name], MD   Brief Narrative:  Misty Blackwell is a 88 y.o. female with medical history significant for permanent A-fib on Coumadin, sick sinus syndrome status post pacemaker placement, iron deficiency anemia, hypertension, who presented to [facility] ED via EMS after a syncopal episode while walking out of the store of unclear etiology. No prodrome, seizure like activity.    6/3: Cardiology evaluated PM, no arrhythmia. Positive orthostatic VS with OT. Needs PT evaluation.   Assessment & Plan:   Principal Problem:   Syncope  Syncope, unclear etiology Patient here for syncope reports she was at grocery store and she was walking out of the store and suddenly lost consciousness.  The next thing she remembers she was waking up with EMS.  No seizure-like activity, no tongue biting, no urinary or bowel incontinence.  She did not have a prodrome.  Per niece, patient likely had a mechanical fall.  She notes that patient may have tripped over an object and then fell to the ground.  She had recent echo, but no significant valvular disease or abnormalities that would explain her syncope.  Head CT was negative for any acute cranial findings. -Cardiology consulted, pacemaker interrogated without any arrhythmia, recommending carotic doppler which is pending. prior CTA 2016 no significant disease Orthostatic vital signs positive with OT.  Encouraged p.o. intake.per family pt supposed to use walker but only uses cane.  Seen by PT OT who recommends SNF.  Patient agreeable.  Family agreeable as well.  TOC working on it.  Cardiology signed off.   Left facial injury post fall from syncope Left scalp hematoma noted on CT head -Has stitches on the laceration on left forehead.  Post repair in the ED -Local wound care -Analgesics PRN   Thoracic back pain CT reassuring, no fracture. Suspect MSK pain -Analgesics  PRN   Right lower lobe pneumonia vs. aspiration pneumonitis New peribronchovascular nodularity and consolidation in the right lower lobe, indicative of bronchopneumonia or aspiration.  Patient has elevated white blood count, no fever, no respiratory symptoms.  Suspected aspiration pneumonitis after her fall.  Her procalcitonin level was 0.17.  -IV azithromycin and Rocephin initiated in the ED. antibiotics were stopped on 10/03/2021 and pneumonia was not presumed, however patient had fever of 102.1 last night, she also has leukocytosis which is improving.  Patient has no shortness of breath and she is not hypoxic.  Procalcitonin unremarkable.  We will recheck procalcitonin tomorrow, in the meantime continue to hold antibiotics and watch closely.   Permanent A-fib on Coumadin INR therapeutic.  Rate controlled.  Pharmacy dosing Coumadin.   Sick sinus syndrome status post pacemaker placement Pacemaker was interrogated, was unrevealing   HFpEF 60 to 65% Last 2D echo done on 05/13/2021 showed LVEF 60 to 65% with grade 2 diastolic dysfunction.Euvolemic on exam   History of breast cancer Resume home anastrozole   Iron deficiency anemia Hemoglobin stable Resume home iron supplement  DVT prophylaxis:   Coumadin   Code Status: Full Code  Family Communication:  None present at bedside.  Plan of care discussed with patient in length and he/she verbalized understanding and agreed with it.  Status is: Inpatient Remains inpatient appropriate because: Awaiting SNF placement   Estimated body mass index is 26.41 kg/m as calculated from the following:   Height as of this encounter: '5\' 4"'$  (1.626 m).   Weight as of this encounter: 69.8 kg.  Nutritional Assessment: Body mass index is 26.41 kg/m.Marland Kitchen Seen by dietician.  I agree with the assessment and plan as outlined below: Nutrition Status:        . Skin Assessment: I have examined the patient's skin and I agree with the wound assessment as  performed by the wound care RN as outlined below:    Consultants:  Cardiology-signed off  Procedures:  None  Antimicrobials:  Anti-infectives (From admission, onward)    Start     Dose/Rate Route Frequency Ordered Stop   10/03/21 1800  azithromycin (ZITHROMAX) 500 mg in sodium chloride 0.9 % 250 mL IVPB  Status:  Discontinued        500 mg 250 mL/hr over 60 Minutes Intravenous Every 24 hours 10/02/21 2001 10/03/21 1451   10/03/21 1000  cefTRIAXone (ROCEPHIN) 2 g in sodium chloride 0.9 % 100 mL IVPB  Status:  Discontinued        2 g 200 mL/hr over 30 Minutes Intravenous Every 24 hours 10/02/21 2001 10/03/21 1451   10/02/21 1645  cefTRIAXone (ROCEPHIN) 1 g in sodium chloride 0.9 % 100 mL IVPB        1 g 200 mL/hr over 30 Minutes Intravenous  Once 10/02/21 1635 10/02/21 1801   10/02/21 1645  azithromycin (ZITHROMAX) 500 mg in sodium chloride 0.9 % 250 mL IVPB        500 mg 250 mL/hr over 60 Minutes Intravenous  Once 10/02/21 1635 10/02/21 1919         Subjective: Seen and examined.  Patient is alert and oriented.  She has no complaints.  Objective: Vitals:   10/04/21 0712 10/04/21 0801 10/04/21 1017 10/04/21 1102  BP: (!) 183/74 (!) 191/72 (!) 158/69 (!) 167/79  Pulse: 76   79  Resp: (!) 24   (!) 21  Temp: 98.8 F (37.1 C)   98.4 F (36.9 C)  TempSrc: Oral   Oral  SpO2: 93%   94%  Weight:      Height:        Intake/Output Summary (Last 24 hours) at 10/04/2021 1413 Last data filed at 10/04/2021 1235 Gross per 24 hour  Intake 657 ml  Output 250 ml  Net 407 ml   Filed Weights   10/02/21 2118 10/03/21 0403 10/04/21 0442  Weight: 67.1 kg 68.4 kg 69.8 kg    Examination:  General exam: Appears calm and comfortable, has laceration on the left forehead. Respiratory system: Clear to auscultation. Respiratory effort normal. Cardiovascular system: S1 & S2 heard, RRR. No JVD, murmurs, rubs, gallops or clicks. No pedal edema. Gastrointestinal system: Abdomen is  nondistended, soft and nontender. No organomegaly or masses felt. Normal bowel sounds heard. Central nervous system: Alert and oriented. No focal neurological deficits. Extremities: Symmetric 5 x 5 power. Skin: No rashes, lesions or ulcers Psychiatry: Judgement and insight appear normal. Mood & affect appropriate.    Data Reviewed: I have personally reviewed following labs and imaging studies  CBC: Recent Labs  Lab 10/02/21 1356 10/02/21 1430 10/03/21 0349 10/04/21 0435  WBC 16.5*  --  13.2* 11.5*  NEUTROABS  --   --  8.5*  --   HGB 13.1 13.9 12.0 11.3*  HCT 41.3 41.0 36.2 34.1*  MCV 93.4  --  92.3 91.9  PLT 186  --  165 269   Basic Metabolic Panel: Recent Labs  Lab 10/02/21 1356 10/02/21 1430 10/03/21 0349 10/04/21 0435  NA 140 139 137 135  K 4.4 4.3 3.8 3.9  CL 105  103 105 104  CO2 25  --  24 24  GLUCOSE 121* 118* 117* 99  BUN 20 24* 18 15  CREATININE 0.85 0.80 0.70 0.62  CALCIUM 9.2  --  8.7* 8.5*  MG  --   --  2.0  --   PHOS  --   --  2.6  --    GFR: Estimated Creatinine Clearance: 43 mL/min (by C-G formula based on SCr of 0.62 mg/dL). Liver Function Tests: Recent Labs  Lab 10/02/21 1356 10/03/21 0349  AST 20 17  ALT 15 13  ALKPHOS 106 90  BILITOT 1.1 0.8  PROT 7.2 6.2*  ALBUMIN 3.8 3.3*   No results for input(s): LIPASE, AMYLASE in the last 168 hours. No results for input(s): AMMONIA in the last 168 hours. Coagulation Profile: Recent Labs  Lab 10/02/21 1356 10/03/21 0349 10/04/21 0435  INR 1.7* 1.7* 2.2*   Cardiac Enzymes: No results for input(s): CKTOTAL, CKMB, CKMBINDEX, TROPONINI in the last 168 hours. BNP (last 3 results) No results for input(s): PROBNP in the last 8760 hours. HbA1C: No results for input(s): HGBA1C in the last 72 hours. CBG: No results for input(s): GLUCAP in the last 168 hours. Lipid Profile: No results for input(s): CHOL, HDL, LDLCALC, TRIG, CHOLHDL, LDLDIRECT in the last 72 hours. Thyroid Function Tests: No  results for input(s): TSH, T4TOTAL, FREET4, T3FREE, THYROIDAB in the last 72 hours. Anemia Panel: No results for input(s): VITAMINB12, FOLATE, FERRITIN, TIBC, IRON, RETICCTPCT in the last 72 hours. Sepsis Labs: Recent Labs  Lab 10/02/21 1733 10/03/21 0349  PROCALCITON  --  0.17  LATICACIDVEN 0.9  --     Recent Results (from the past 240 hour(s))  Resp Panel by RT-PCR (Flu A&B, Covid) Anterior Nasal Swab     Status: None   Collection Time: 10/02/21  8:01 PM   Specimen: Anterior Nasal Swab  Result Value Ref Range Status   SARS Coronavirus 2 by RT PCR NEGATIVE NEGATIVE Final    Comment: (NOTE) SARS-CoV-2 target nucleic acids are NOT DETECTED.  The SARS-CoV-2 RNA is generally detectable in upper respiratory specimens during the acute phase of infection. The lowest concentration of SARS-CoV-2 viral copies this assay can detect is 138 copies/mL. A negative result does not preclude SARS-Cov-2 infection and should not be used as the sole basis for treatment or other patient management decisions. A negative result may occur with  improper specimen collection/handling, submission of specimen other than nasopharyngeal swab, presence of viral mutation(s) within the areas targeted by this assay, and inadequate number of viral copies(<138 copies/mL). A negative result must be combined with clinical observations, patient history, and epidemiological information. The expected result is Negative.  Fact Sheet for Patients:  EntrepreneurPulse.com.au  Fact Sheet for Healthcare Providers:  IncredibleEmployment.be  This test is no t yet approved or cleared by the [place] FDA and  has been authorized for detection and/or diagnosis of SARS-CoV-2 by FDA under an Emergency Use Authorization (EUA). This EUA will remain  in effect (meaning this test can be used) for the duration of the COVID-19 declaration under Section 564(b)(1) of the Act, 21 U.S.C.section  360bbb-3(b)(1), unless the authorization is terminated  or revoked sooner.       Influenza A by PCR NEGATIVE NEGATIVE Final   Influenza B by PCR NEGATIVE NEGATIVE Final    Comment: (NOTE) The Xpert Xpress SARS-CoV-2/FLU/RSV plus assay is intended as an aid in the diagnosis of influenza from Nasopharyngeal swab specimens and should not be used as  a sole basis for treatment. Nasal washings and aspirates are unacceptable for Xpert Xpress SARS-CoV-2/FLU/RSV testing.  Fact Sheet for Patients: EntrepreneurPulse.com.au  Fact Sheet for Healthcare Providers: IncredibleEmployment.be  This test is not yet approved or cleared by the [place] FDA and has been authorized for detection and/or diagnosis of SARS-CoV-2 by FDA under an Emergency Use Authorization (EUA). This EUA will remain in effect (meaning this test can be used) for the duration of the COVID-19 declaration under Section 564(b)(1) of the Act, 21 U.S.C. section 360bbb-3(b)(1), unless the authorization is terminated or revoked.  Performed at [facility], [place] [address], [place], [place] [address]      Radiology Studies: CT Head Wo Contrast  Result Date: 10/02/2021 CLINICAL DATA:  Head trauma, moderate-severe EXAM: CT HEAD WITHOUT CONTRAST CT MAXILLOFACIAL WITHOUT CONTRAST TECHNIQUE: Multidetector CT imaging of the head and maxillofacial structures were performed using the standard protocol without intravenous contrast. Multiplanar CT image reconstructions of the maxillofacial structures were also generated. RADIATION DOSE REDUCTION: This exam was performed according to the departmental dose-optimization program which includes automated exposure control, adjustment of the mA and/or kV according to patient size and/or use of iterative reconstruction technique. COMPARISON:  December 2021 FINDINGS: CT HEAD FINDINGS Brain: No acute intracranial hemorrhage, mass effect or edema. Gray-white  differentiation is preserved. Prominence of the ventricles and sulci reflects similar parenchymal volume loss. Minimal patchy hypoattenuation in the supratentorial white matter is nonspecific but may reflect similar minor chronic microvascular ischemic changes. No extra-axial collection. Vascular: There is intracranial atherosclerotic calcification at the skull base. Skull: Unremarkable. Other: Left frontal scalp hematoma. CT MAXILLOFACIAL FINDINGS Osseous: No acute facial fracture. Degenerative changes at the left temporomandibular joint. Orbits: No intraorbital hematoma. Sinuses: Mild mucosal thickening. Soft tissues: Left frontal scalp and periorbital soft tissue swelling with hematoma and laceration. IMPRESSION: No evidence of acute intracranial injury.  No acute facial fracture. Electronically Signed   By: Macy Mis M.D.   On: 10/02/2021 14:48   CT Cervical Spine Wo Contrast  Result Date: 10/02/2021 CLINICAL DATA:  Polytrauma, blunt; Low back pain, trauma; Mid-back pain. EXAM: CT CERVICAL, THORACIC, AND LUMBAR SPINE WITHOUT CONTRAST TECHNIQUE: Multidetector CT imaging of the cervical, thoracic and lumbar spine was performed without intravenous contrast. Multiplanar CT image reconstructions were also generated. RADIATION DOSE REDUCTION: This exam was performed according to the departmental dose-optimization program which includes automated exposure control, adjustment of the mA and/or kV according to patient size and/or use of iterative reconstruction technique. COMPARISON:  CT angiography chest 05/12/2021, CT cervical spine 04/05/2020 FINDINGS: CT CERVICAL SPINE FINDINGS Alignment: Normal. Skull base and vertebrae: Multilevel degenerative changes spine most prominent at the C4-C5 level. Associated severe right osseous neural foraminal stenosis at the C4-C5 level. No severe osseous central canal stenosis. No acute fracture. No aggressive appearing focal osseous lesion or focal pathologic process. Soft  tissues and spinal canal: No prevertebral fluid or swelling. No visible canal hematoma. Upper chest: Biapical pleural/pulmonary scarring. Other: None. CT THORACIC SPINE FINDINGS Alignment: Normal. Vertebrae: No acute fracture or focal pathologic process. Paraspinal and other soft tissues: Negative. Disc levels: Multilevel neural intervertebral disc space vacuum phenomenon. Otherwise maintained. CT LUMBAR SPINE FINDINGS Segmentation: 5 lumbar type vertebrae. Alignment: Levocurvature of the lumbar spine centered at the L2-L3 level may be positional. Vertebrae: Multilevel osteophyte formation and facet arthropathy. Multilevel posterior disc osteophyte complex. No severe osseous neural foraminal or central canal stenosis. No acute fracture or focal pathologic process. Paraspinal and other soft tissues: Negative. Disc  levels: Multilevel intervertebral disc space narrowing and vacuum phenomenon. Other: Atherosclerotic plaque. IMPRESSION: 1. No acute displaced fracture or traumatic listhesis of the cervical spine. 2. No acute displaced fracture or traumatic listhesis of the thoracic spine. 3. No acute displaced fracture or traumatic listhesis of the lumbar spine. 4. Severe osseous neural foraminal stenosis at the right C4-C5 level. 5. Please see separately dictated CT head, chest, abdomen, pelvis 10/02/2021. 6.  Aortic Atherosclerosis (ICD10-I70.0). Electronically Signed   By: Iven Finn M.D.   On: 10/02/2021 15:20   CT Lumbar Spine Wo Contrast  Result Date: 10/02/2021 CLINICAL DATA:  Polytrauma, blunt; Low back pain, trauma; Mid-back pain. EXAM: CT CERVICAL, THORACIC, AND LUMBAR SPINE WITHOUT CONTRAST TECHNIQUE: Multidetector CT imaging of the cervical, thoracic and lumbar spine was performed without intravenous contrast. Multiplanar CT image reconstructions were also generated. RADIATION DOSE REDUCTION: This exam was performed according to the departmental dose-optimization program which includes automated  exposure control, adjustment of the mA and/or kV according to patient size and/or use of iterative reconstruction technique. COMPARISON:  CT angiography chest 05/12/2021, CT cervical spine 04/05/2020 FINDINGS: CT CERVICAL SPINE FINDINGS Alignment: Normal. Skull base and vertebrae: Multilevel degenerative changes spine most prominent at the C4-C5 level. Associated severe right osseous neural foraminal stenosis at the C4-C5 level. No severe osseous central canal stenosis. No acute fracture. No aggressive appearing focal osseous lesion or focal pathologic process. Soft tissues and spinal canal: No prevertebral fluid or swelling. No visible canal hematoma. Upper chest: Biapical pleural/pulmonary scarring. Other: None. CT THORACIC SPINE FINDINGS Alignment: Normal. Vertebrae: No acute fracture or focal pathologic process. Paraspinal and other soft tissues: Negative. Disc levels: Multilevel neural intervertebral disc space vacuum phenomenon. Otherwise maintained. CT LUMBAR SPINE FINDINGS Segmentation: 5 lumbar type vertebrae. Alignment: Levocurvature of the lumbar spine centered at the L2-L3 level may be positional. Vertebrae: Multilevel osteophyte formation and facet arthropathy. Multilevel posterior disc osteophyte complex. No severe osseous neural foraminal or central canal stenosis. No acute fracture or focal pathologic process. Paraspinal and other soft tissues: Negative. Disc levels: Multilevel intervertebral disc space narrowing and vacuum phenomenon. Other: Atherosclerotic plaque. IMPRESSION: 1. No acute displaced fracture or traumatic listhesis of the cervical spine. 2. No acute displaced fracture or traumatic listhesis of the thoracic spine. 3. No acute displaced fracture or traumatic listhesis of the lumbar spine. 4. Severe osseous neural foraminal stenosis at the right C4-C5 level. 5. Please see separately dictated CT head, chest, abdomen, pelvis 10/02/2021. 6.  Aortic Atherosclerosis (ICD10-I70.0).  Electronically Signed   By: Iven Finn M.D.   On: 10/02/2021 15:20   DG Pelvis Portable  Result Date: 10/02/2021 CLINICAL DATA:  Fall, level 2 trauma. EXAM: PORTABLE PELVIS 1-2 VIEWS COMPARISON:  None Available. FINDINGS: There is no evidence of pelvic fracture or diastasis. No pelvic bone lesions are seen. IMPRESSION: No acute osseous abnormality. Electronically Signed   By: Dahlia Bailiff M.D.   On: 10/02/2021 14:25   CT CHEST ABDOMEN PELVIS W CONTRAST  Result Date: 10/02/2021 CLINICAL DATA:  Fall with chest pain and abdominal pain, on Coumadin. EXAM: CT CHEST, ABDOMEN, AND PELVIS WITH CONTRAST TECHNIQUE: Multidetector CT imaging of the chest, abdomen and pelvis was performed following the standard protocol during bolus administration of intravenous contrast. RADIATION DOSE REDUCTION: This exam was performed according to the departmental dose-optimization program which includes automated exposure control, adjustment of the mA and/or kV according to patient size and/or use of iterative reconstruction technique. CONTRAST:  58m OMNIPAQUE IOHEXOL 300 MG/ML  SOLN COMPARISON:  CT chest 05/12/2021. FINDINGS: CT CHEST FINDINGS Cardiovascular: Atherosclerotic calcification of the aorta and coronary arteries. Enlarged pulmonic trunk and heart. No pericardial effusion. Mediastinum/Nodes: 1.8 cm minimally hypodense nodule in the right thyroid. No follow-up recommended unless clinically warranted. (Ref: J Am Coll Radiol. 2015 Feb;12(2): 143-50). Mediastinal lymph nodes are not enlarged by CT size criteria. No hilar or axillary adenopathy. Esophagus is grossly unremarkable. Lungs/Pleura: Pulmonary nodules measure up to 6 mm in the right middle lobe (4/116), as on 05/12/2021. Patchy peribronchovascular nodularity and consolidation in the right lower lobe, new. No pleural fluid. Airway is unremarkable. Musculoskeletal: Degenerative changes in the spine.  No fracture. CT ABDOMEN PELVIS FINDINGS Hepatobiliary: Liver  margin is mildly irregular. Cholecystectomy. No biliary ductal dilatation. Pancreas: Negative. Spleen: Negative. Adrenals/Urinary Tract: Adrenal glands are unremarkable. Parenchymal thinning and scarring in the kidneys. Subcentimeter lesions in the left kidney, too small to characterize. No specific follow-up necessary. Ureters are decompressed. Bladder is grossly unremarkable. Stomach/Bowel: Stomach, small bowel and colon are unremarkable. Appendix is not readily visualized. Vascular/Lymphatic: Atherosclerotic calcification of the aorta. No pathologically enlarged lymph nodes. Reproductive: Hysterectomy.  No adnexal mass. Other: No free fluid.  Mesenteries and peritoneum are unremarkable. Musculoskeletal: Degenerative changes in the spine. No worrisome lytic or sclerotic lesions. IMPRESSION: 1. No evidence of acute trauma to the chest, abdomen or pelvis. 2. New peribronchovascular nodularity and consolidation in the right lower lobe, indicative of bronchopneumonia or aspiration. Difficult to definitively exclude pulmonary contusion. 3. Mild marginal regulated the liver raises suspicion for cirrhosis. 4.  Aortic atherosclerosis (ICD10-I70.0). Electronically Signed   By: Lorin Picket M.D.   On: 10/02/2021 15:21   CT T-SPINE NO CHARGE  Result Date: 10/02/2021 CLINICAL DATA:  Polytrauma, blunt; Low back pain, trauma; Mid-back pain. EXAM: CT CERVICAL, THORACIC, AND LUMBAR SPINE WITHOUT CONTRAST TECHNIQUE: Multidetector CT imaging of the cervical, thoracic and lumbar spine was performed without intravenous contrast. Multiplanar CT image reconstructions were also generated. RADIATION DOSE REDUCTION: This exam was performed according to the departmental dose-optimization program which includes automated exposure control, adjustment of the mA and/or kV according to patient size and/or use of iterative reconstruction technique. COMPARISON:  CT angiography chest 05/12/2021, CT cervical spine 04/05/2020 FINDINGS: CT  CERVICAL SPINE FINDINGS Alignment: Normal. Skull base and vertebrae: Multilevel degenerative changes spine most prominent at the C4-C5 level. Associated severe right osseous neural foraminal stenosis at the C4-C5 level. No severe osseous central canal stenosis. No acute fracture. No aggressive appearing focal osseous lesion or focal pathologic process. Soft tissues and spinal canal: No prevertebral fluid or swelling. No visible canal hematoma. Upper chest: Biapical pleural/pulmonary scarring. Other: None. CT THORACIC SPINE FINDINGS Alignment: Normal. Vertebrae: No acute fracture or focal pathologic process. Paraspinal and other soft tissues: Negative. Disc levels: Multilevel neural intervertebral disc space vacuum phenomenon. Otherwise maintained. CT LUMBAR SPINE FINDINGS Segmentation: 5 lumbar type vertebrae. Alignment: Levocurvature of the lumbar spine centered at the L2-L3 level may be positional. Vertebrae: Multilevel osteophyte formation and facet arthropathy. Multilevel posterior disc osteophyte complex. No severe osseous neural foraminal or central canal stenosis. No acute fracture or focal pathologic process. Paraspinal and other soft tissues: Negative. Disc levels: Multilevel intervertebral disc space narrowing and vacuum phenomenon. Other: Atherosclerotic plaque. IMPRESSION: 1. No acute displaced fracture or traumatic listhesis of the cervical spine. 2. No acute displaced fracture or traumatic listhesis of the thoracic spine. 3. No acute displaced fracture or traumatic listhesis of the lumbar spine. 4. Severe osseous neural foraminal stenosis at the right C4-C5 level. 5.  Please see separately dictated CT head, chest, abdomen, pelvis 10/02/2021. 6.  Aortic Atherosclerosis (ICD10-I70.0). Electronically Signed   By: Iven Finn M.D.   On: 10/02/2021 15:20   DG Chest Port 1 View  Result Date: 10/02/2021 CLINICAL DATA:  Fall EXAM: PORTABLE CHEST 1 VIEW COMPARISON:  05/12/2021 FINDINGS: Chronic  interstitial changes. No new consolidation or edema. No pleural effusion or pneumothorax. Stable cardiomegaly. Left chest wall dual lead pacemaker. Included osseous structures are grossly intact. IMPRESSION: No acute process in the chest. Electronically Signed   By: Macy Mis M.D.   On: 10/02/2021 15:06   CT Maxillofacial Wo Contrast  Result Date: 10/02/2021 CLINICAL DATA:  Head trauma, moderate-severe EXAM: CT HEAD WITHOUT CONTRAST CT MAXILLOFACIAL WITHOUT CONTRAST TECHNIQUE: Multidetector CT imaging of the head and maxillofacial structures were performed using the standard protocol without intravenous contrast. Multiplanar CT image reconstructions of the maxillofacial structures were also generated. RADIATION DOSE REDUCTION: This exam was performed according to the departmental dose-optimization program which includes automated exposure control, adjustment of the mA and/or kV according to patient size and/or use of iterative reconstruction technique. COMPARISON:  December 2021 FINDINGS: CT HEAD FINDINGS Brain: No acute intracranial hemorrhage, mass effect or edema. Gray-white differentiation is preserved. Prominence of the ventricles and sulci reflects similar parenchymal volume loss. Minimal patchy hypoattenuation in the supratentorial white matter is nonspecific but may reflect similar minor chronic microvascular ischemic changes. No extra-axial collection. Vascular: There is intracranial atherosclerotic calcification at the skull base. Skull: Unremarkable. Other: Left frontal scalp hematoma. CT MAXILLOFACIAL FINDINGS Osseous: No acute facial fracture. Degenerative changes at the left temporomandibular joint. Orbits: No intraorbital hematoma. Sinuses: Mild mucosal thickening. Soft tissues: Left frontal scalp and periorbital soft tissue swelling with hematoma and laceration. IMPRESSION: No evidence of acute intracranial injury.  No acute facial fracture. Electronically Signed   By: Macy Mis M.D.    On: 10/02/2021 14:48    Scheduled Meds:  acidophilus  1 capsule Oral Daily   amLODipine  5 mg Oral Daily   anastrozole  1 mg Oral Daily   vitamin C  1,000 mg Oral Daily   cholecalciferol  5,000 Units Oral Daily   clotrimazole   Topical BID   ferrous sulfate  325 mg Oral Q breakfast   hydrOXYzine  100 mg Oral QHS   irbesartan  300 mg Oral Daily   vitamin B-12  1,000 mcg Oral Daily   warfarin  3 mg Oral ONCE-1600   Warfarin - Pharmacist Dosing Inpatient   Does not apply q1600   Continuous Infusions:   LOS: 2 days   Darliss Cheney, MD [facility]  10/04/2021, 2:13 PM   *Please note that this is a verbal dictation therefore any spelling or grammatical errors are due to the "Nathalie One" system interpretation.  Please page via [place] and do not message via secure chat for urgent patient care matters. Secure chat can be used for non urgent patient care matters.  How to contact the [address] Attending or Consulting provider [place] or covering provider during after hours [place], for this patient?  Check the care team in [facility] and look for a) attending/consulting TRH provider listed and b) the [facility] team listed. Page or secure chat [phone]. Log into [email or web address] and use [place]'s universal password to access. If you do not have the password, please contact the hospital operator. Locate the [facility] provider you are looking for under [facility] and page to a number that you can be  directly reached. If you still have difficulty reaching the provider, please page the [facility] (Director on Call) for the Hospitalists listed on amion for assistance.

## 2021-10-04 NOTE — TOC Initial Note (Addendum)
Transition of Care [facility]) - Initial/Assessment Note    Patient Details  Name: Misty Blackwell MRN: [id number] Date of Birth: July 15, 1927  Transition of Care [facility]) CM/SW Contact:    Bary Castilla, LCSW Phone Number: 10/04/2021, 10:59 AM  Clinical Narrative:                  CSW met with patient and pt's niece [name] to discuss PT recommendation of a SNF. Patient was aware of recommendation and in agreement with going to a ST SNF. CSW discussed the SNF process.CSW provided patient with medicare.gov rating list.  Patient gave CSW permission to fax referrals out to local facilities.CSW answered questions about the SNF process and the next steps in the process. Pt explained that she has never been to rehab before and wanted niece [name] to make SNF choice. [name] states that she it pt is POA.  TOC team will continue to assist with discharge planning needs.      Expected Discharge Plan: Skilled Nursing Facility Barriers to Discharge: Continued Medical Work up, SNF Pending bed offer, Insurance Authorization   Patient Goals and CMS Choice Patient states their goals for this hospitalization and ongoing recovery are:: TO be able to go back home CMS Medicare.gov Compare Post Acute Care list provided to:: Patient Choice offered to / list presented to : Patient  Expected Discharge Plan and Services Expected Discharge Plan: [place]       Living arrangements for the past 2 months: Single Family Home                                      Prior Living Arrangements/Services Living arrangements for the past 2 months: Single Family Home Lives with:: Self Patient language and need for interpreter reviewed:: Yes Do you feel safe going back to the place where you live?: Yes        Care giver support system in place?: Yes (comment)      Activities of Daily Living Home Assistive Devices/Equipment: Eyeglasses, Hearing aid ADL Screening (condition at time of  admission) Patient's cognitive ability adequate to safely complete daily activities?: Yes Is the patient deaf or have difficulty hearing?: Yes Does the patient have difficulty seeing, even when wearing glasses/contacts?: No Does the patient have difficulty concentrating, remembering, or making decisions?: No Patient able to express need for assistance with ADLs?: Yes Does the patient have difficulty dressing or bathing?: Yes Independently performs ADLs?: No Communication: Independent Dressing (OT): Needs assistance Is this a change from baseline?: Change from baseline, expected to last <3days Grooming: Needs assistance Is this a change from baseline?: Change from baseline, expected to last <3 days Feeding: Independent Bathing: Needs assistance Is this a change from baseline?: Change from baseline, expected to last <3 days Toileting: Needs assistance Is this a change from baseline?: Change from baseline, expected to last <3 days In/Out Bed: Needs assistance Is this a change from baseline?: Change from baseline, expected to last <3 days Walks in Home: Independent with device (comment) Does the patient have difficulty walking or climbing stairs?: Yes Weakness of Legs: Both Weakness of Arms/Hands: None  Permission Sought/Granted   Permission granted to share information with : Yes, Verbal Permission Granted  Share Information with NAME: [name]  Permission granted to share info w AGENCY: SNFs  Permission granted to share info w Relationship: Niece  Permission granted to share info w Contact Information:  [phone]  Emotional Assessment Appearance:: Appears stated age Attitude/Demeanor/Rapport: Engaged Affect (typically observed): Accepting, Appropriate, Adaptable Orientation: : Oriented to Self, Oriented to Place, Oriented to  Time, Oriented to Situation      Admission diagnosis:  Syncope [R55] Patient Active Problem List   Diagnosis Date Noted   Syncope 10/02/2021    Pacemaker battery depletion 08/24/2021   Longstanding persistent atrial fibrillation ([place])    Aortic atherosclerosis ([place]) by CXR on 04/03/2020 01/12/2021   Invasive ductal carcinoma of breast, female, right ([place]) 12/13/2019   Malignant neoplasm of upper-outer quadrant of right breast in female, estrogen receptor positive ([place]) 11/21/2019   Anxiety 06/18/2019   Insomnia 06/18/2019   Recurrent major 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 ([place]) 06/18/2019   SSS (sick sinus syndrome) ([place]) 09/26/2018   Abnormal glucose 08/29/2018   Neural foraminal stenosis of cervical spine 04/18/2018   History of TIA (transient ischemic attack) 04/17/2018   Chronic atrial fibrillation (HCC)    Chronic diastolic heart failure ([place]) 11/10/2017   Vitamin D deficiency 08/15/2013   Pacemaker 09/19/2012   Long term current use of anticoagulant therapy 07/18/2012   Hyperlipidemia, mixed 09/03/2008   Essential hypertension 09/03/2008   Coronary atherosclerosis 09/03/2008   GERD 09/03/2008   FIBROCYSTIC BREAST DISEASE 09/03/2008   Osteoarthritis 09/03/2008   PCP:  [name], MD Pharmacy:   [facility] [place], [place] - [place] [place] AT [facility] OF [address] [place] [address] Phone: [phone] Fax: [phone]     Social Determinants of Health (SDOH) Interventions    Readmission Risk Interventions     View : No data to display.

## 2021-10-05 DIAGNOSIS — M549 Dorsalgia, unspecified: Secondary | ICD-10-CM

## 2021-10-05 DIAGNOSIS — S0003XA Contusion of scalp, initial encounter: Secondary | ICD-10-CM

## 2021-10-05 DIAGNOSIS — S0181XA Laceration without foreign body of other part of head, initial encounter: Secondary | ICD-10-CM

## 2021-10-05 LAB — CBC WITH DIFFERENTIAL/PLATELET
Abs Immature Granulocytes: 0.08 10*3/uL — ABNORMAL HIGH (ref 0.00–0.07)
Basophils Absolute: 0.1 10*3/uL (ref 0.0–0.1)
Basophils Relative: 1 %
Eosinophils Absolute: 0.3 10*3/uL (ref 0.0–0.5)
Eosinophils Relative: 3 %
HCT: 36.6 % (ref 36.0–46.0)
Hemoglobin: 12.5 g/dL (ref 12.0–15.0)
Immature Granulocytes: 1 %
Lymphocytes Relative: 18 %
Lymphs Abs: 2 10*3/uL (ref 0.7–4.0)
MCH: 30.8 pg (ref 26.0–34.0)
MCHC: 34.2 g/dL (ref 30.0–36.0)
MCV: 90.1 fL (ref 80.0–100.0)
Monocytes Absolute: 1.2 10*3/uL — ABNORMAL HIGH (ref 0.1–1.0)
Monocytes Relative: 11 %
Neutro Abs: 7.4 10*3/uL (ref 1.7–7.7)
Neutrophils Relative %: 66 %
Platelets: 192 10*3/uL (ref 150–400)
RBC: 4.06 MIL/uL (ref 3.87–5.11)
RDW: 12 % (ref 11.5–15.5)
WBC: 11.1 10*3/uL — ABNORMAL HIGH (ref 4.0–10.5)
nRBC: 0 % (ref 0.0–0.2)

## 2021-10-05 LAB — PROTIME-INR
INR: 2.2 — ABNORMAL HIGH (ref 0.8–1.2)
Prothrombin Time: 24.6 seconds — ABNORMAL HIGH (ref 11.4–15.2)

## 2021-10-05 LAB — PROCALCITONIN: Procalcitonin: 0.1 ng/mL

## 2021-10-05 MED ORDER — WARFARIN SODIUM 2.5 MG PO TABS
4.5000 mg | ORAL_TABLET | ORAL | Status: DC
  Administered 2021-10-05: 4.5 mg via ORAL
  Filled 2021-10-05: qty 1

## 2021-10-05 MED ORDER — WARFARIN SODIUM 3 MG PO TABS
3.0000 mg | ORAL_TABLET | ORAL | Status: DC
Start: 2021-10-06 — End: 2021-10-06

## 2021-10-05 MED ORDER — OXYCODONE HCL 5 MG PO TABS
5.0000 mg | ORAL_TABLET | Freq: Four times a day (QID) | ORAL | Status: DC | PRN
  Administered 2021-10-05 (×2): 5 mg via ORAL
  Filled 2021-10-05 (×2): qty 1

## 2021-10-05 NOTE — Progress Notes (Signed)
Occupational Therapy Treatment Patient Details Name: Misty Blackwell MRN: [id number] DOB: 1928/03/07 Today's Date: 10/05/2021   History of present illness 88 y.o. female with medical history significant for permanent A-fib on Coumadin, vertigo, sick sinus syndrome status post pacemaker placement, iron deficiency anemia, hypertension, who presented to [facility] ED via EMS after a fall when walking out of a store due to presumed syncopal episode.  Found to have facial lacerations.   OT comments  Patient continues to make incremental progress towards goals in skilled OT session. Patient's session encompassed bed level ADLs and bed mobility as patient was in increased pain with all movement. OT encouraging patient to attempt, however patient politely declining. OT educating patient on the importance of mobility to increase activity tolerance and avoid increased weakness with patient in agreement, however wanting to focus on ADLs. OT recommendation changed to SNF level rehab to allow patient increased time to return to prior level with family support. OT will continue to follow acutely.      Recommendations for follow up therapy are one component of a multi-disciplinary discharge planning process, led by the attending physician.  Recommendations may be updated based on patient status, additional functional criteria and insurance authorization.    Follow Up Recommendations  Skilled nursing-short term rehab (<3 hours/day)    Assistance Recommended at Discharge Intermittent Supervision/Assistance  Patient can return home with the following  A lot of help with walking and/or transfers;A lot of help with bathing/dressing/bathroom;Assistance with cooking/housework;Direct supervision/assist for medications management;Direct supervision/assist for financial management;Assist for transportation;Help with stairs or ramp for entrance   Equipment Recommendations  None recommended by OT    Recommendations for  Other Services      Precautions / Restrictions Precautions Precautions: Fall Precaution Comments: h/o vertigo, neuropathy bilateral feet Restrictions Weight Bearing Restrictions: No       Mobility Bed Mobility Overal bed mobility: Needs Assistance                  Transfers                   General transfer comment: declined due to pain     Balance                                           ADL either performed or assessed with clinical judgement   ADL Overall ADL's : Needs assistance/impaired     Grooming: Wash/dry hands;Wash/dry face;Brushing hair;Set up                                 General ADL Comments: Patient declining OOB mobility due to pain, but receptive to ADLs at bedside and repositioning    Extremity/Trunk Assessment              Vision       Perception     Praxis      Cognition Arousal/Alertness: Awake/alert Behavior During Therapy: Anxious Overall Cognitive Status: Within Functional Limits for tasks assessed                                 General Comments: Remains anxious with movement, but easily distracted with reorientation to task or engagement in conversation        Exercises  Shoulder Instructions       General Comments      Pertinent Vitals/ Pain       Pain Assessment Pain Assessment: Faces Faces Pain Scale: Hurts whole lot Pain Location: L hip, L upper back, L side of face Pain Descriptors / Indicators: Sore, Tender Pain Intervention(s): Limited activity within patient's tolerance, Monitored during session, Repositioned  Home Living                                          Prior Functioning/Environment              Frequency  Min 2X/week        Progress Toward Goals  OT Goals(current goals can now be found in the care plan section)  Progress towards OT goals: Progressing toward goals  Acute Rehab OT Goals Patient  Stated Goal: to get stronger and decrease pain OT Goal Formulation: With patient Time For Goal Achievement: 10/16/21 Potential to Achieve Goals: Good  Plan Discharge plan needs to be updated    Co-evaluation                 AM-PAC OT "6 Clicks" Daily Activity     Outcome Measure   Help from another person eating meals?: None Help from another person taking care of personal grooming?: A Little Help from another person toileting, which includes using toliet, bedpan, or urinal?: A Little Help from another person bathing (including washing, rinsing, drying)?: A Lot Help from another person to put on and taking off regular upper body clothing?: A Little Help from another person to put on and taking off regular lower body clothing?: A Lot 6 Click Score: 17    End of Session    OT Visit Diagnosis: Unsteadiness on feet (R26.81);Muscle weakness (generalized) (M62.81)   Activity Tolerance Patient limited by pain   Patient Left in bed;with call bell/phone within reach;with bed alarm set   Nurse Communication Mobility status        Time: 1607-3710 OT Time Calculation (min): 19 min  Charges: OT General Charges $OT Visit: 1 Visit OT Treatments $Self Care/Home Management : 8-22 mins  Corinne Ports E. Dimitry Holsworth, OTR/L Acute Rehabilitation Services [phone] [place] 10/05/2021, 1:14 PM

## 2021-10-05 NOTE — TOC CAGE-AID Note (Signed)
Transition of Care [facility]) - CAGE-AID Screening   Patient Details  Name: Misty Blackwell MRN: [id number] Date of Birth: 1927/08/07  Transition of Care [facility]) CM/SW Contact:    Keeshawn Fakhouri C Tarpley-Carter, [place] Phone Number: 10/05/2021, 2:25 PM   Clinical Narrative: Pt participated in [place].  Pt stated she does not use substance or ETOH.  Pt was not offered resources, due to no usage of substance or ETOH.    [place] Tarpley-Carter, MSW, LCSW-A Pronouns:  She/Her/Hers [facility] Clinical Social Worker Direct Number:  [phone] [email or web address]   CAGE-AID Screening:    Have You Ever Felt You Ought to Cut Down on Your Drinking or Drug Use?: No Have People Annoyed You By [employer] Your Drinking Or Drug Use?: No Have You Felt Bad Or Guilty About Your Drinking Or Drug Use?: No Have You Ever Had a Drink or Used Drugs First Thing In The Morning to Steady Your Nerves or to Get Rid of a Hangover?: No CAGE-AID Score: 0  Substance Abuse Education Offered: No

## 2021-10-05 NOTE — Progress Notes (Signed)
[facility] for warfarin Indication: atrial fibrillation  Allergies  Allergen Reactions   Latex Itching   Ace Inhibitors Other (See Comments)    Unknown reaction   Augmentin [Amoxicillin-Pot Clavulanate] Other (See Comments)    Unknown   Ciprofloxacin Other (See Comments)    Unknown    Levaquin [Levofloxacin In D5w] Other (See Comments)    Unknown   Zocor [Simvastatin] Other (See Comments)    Unknown    Acrylic Polymer [Carbomer] Itching   Chocolate Other (See Comments)    Migraines    Gabapentin Other (See Comments)    Unsteady gait     Patient Measurements: Height: '5\' 4"'$  (162.6 cm) Weight: 67.8 kg (149 lb 7.6 oz) IBW/kg (Calculated) : 54.7  Vital Signs: Temp: 99 F (37.2 C) (06/05 0441) Temp Source: Oral (06/05 0441) BP: 173/87 (06/05 0441) Pulse Rate: 73 (06/05 0441)  Labs: Recent Labs    10/02/21 1430 10/02/21 1733 10/02/21 1930 10/03/21 0349 10/04/21 0435 10/05/21 0351  HGB 13.9  --   --  12.0 11.3* 12.5  HCT 41.0  --   --  36.2 34.1* 36.6  PLT  --   --   --  165 157 192  LABPROT  --   --   --  19.9* 24.2* 24.6*  INR  --   --   --  1.7* 2.2* 2.2*  CREATININE 0.80  --   --  0.70 0.62  --   TROPONINIHS  --  13 15  --   --   --      Estimated Creatinine Clearance: 42.4 mL/min (by C-G formula based on SCr of 0.62 mg/dL).  Assessment: 34 yof with a history of AF w/ slow ventriular response and medtronic PM, TIA, HTN, CAD, HF presenting after a syncopal episode in store parking lot. Presented to ED with forehead laceration. Warfarin per pharmacy consult placed for AF. CT head with no acute injury.  Patient takes warfarin prior to arrival. Home dose is 3 mg (3 mg x 1) every Sun, Tue, Thu; 4.5 mg (3 mg x 1.5) all other days. Warfarin last taken 6/1 per patient's medication history.  INR remains therapeutic (2.2). No bleeding noted.  Goal of Therapy:  INR 2-3 Monitor platelets by anticoagulation protocol: Yes    Plan:  Restart home warfarin regimen -  3 mg every Sun, Tue, Thu and 4.5 mg all other days Monitor daily INR and H/H Monitor for s/s of bleeding  Sherlon Handing, PharmD, BCPS Please see amion for complete clinical pharmacist phone list 10/05/2021 8:07 AM

## 2021-10-05 NOTE — Progress Notes (Signed)
PROGRESS NOTE    Misty Blackwell  KZS:[id number] DOB: 04-30-1928 DOA: 10/02/2021 PCP: [name], MD   Brief Narrative:  Misty Blackwell is a 88 y.o. female with medical history significant for permanent A-fib on Coumadin, sick sinus syndrome status post pacemaker placement, iron deficiency anemia, hypertension, who presented to [facility] ED via EMS after a syncopal episode while walking out of the store of unclear etiology. No prodrome, seizure like activity.    6/3: Cardiology evaluated PM, no arrhythmia. Positive orthostatic VS with OT. Needs PT evaluation.   Assessment & Plan:   Principal Problem:   Syncope Active Problems:   Chronic atrial fibrillation (HCC)   Scalp hematoma   Forehead laceration   Back pain  Syncope, unclear etiology Patient here for syncope reports she was at grocery store and she was walking out of the store and suddenly lost consciousness.  The next thing she remembers she was waking up with EMS.  No seizure-like activity, no tongue biting, no urinary or bowel incontinence.  She did not have a prodrome.  Per niece, patient likely had a mechanical fall.  She notes that patient may have tripped over an object and then fell to the ground.  She had recent echo, but no significant valvular disease or abnormalities that would explain her syncope.  Head CT was negative for any acute cranial findings. -Cardiology consulted, pacemaker interrogated without any arrhythmia, recommending carotic doppler which is pending. prior CTA 2016 no significant disease Orthostatic vital signs positive with OT.  Encouraged p.o. intake.per family pt supposed to use walker but only uses cane.  Seen by PT OT who recommends SNF.  Patient agreeable. Cardiology signed off.    Left facial injury post fall from syncope Left scalp hematoma noted on CT head -Has stitches on the laceration on left forehead.  Post repair in the ED -Local wound care -Analgesics PRN   Thoracic back  pain CT reassuring, no fracture. Suspect MSK pain, she is still complaining of severe pain. -Analgesics PRN, will add oxycodone today.   Right lower lobe pneumonia vs. aspiration pneumonitis New peribronchovascular nodularity and consolidation in the right lower lobe, indicative of bronchopneumonia or aspiration.  Patient has elevated white blood count, no fever, no respiratory symptoms.  Suspected aspiration pneumonitis after her fall.  Her procalcitonin level was 0.17.  -IV azithromycin and Rocephin initiated in the ED. antibiotics were stopped on 10/03/2021 and pneumonia was not presumed, however patient had fever of 102.1 the night before, she also has leukocytosis which is improving.  Patient has no shortness of breath and she is not hypoxic.  Procalcitonin unremarkable.  Now afebrile for more than 24 hours.  We will continue to monitor off antibiotics.   Permanent A-fib on Coumadin INR therapeutic.  Rate controlled.  Pharmacy dosing Coumadin.   Sick sinus syndrome status post pacemaker placement Pacemaker was interrogated, was unrevealing   HFpEF 60 to 65% Last 2D echo done on 05/13/2021 showed LVEF 60 to 65% with grade 2 diastolic dysfunction.Euvolemic on exam   History of breast cancer Resume home anastrozole   Iron deficiency anemia Hemoglobin stable Resume home iron supplement  Generalized pain: Patient complains of pain all over the body, right foot, right leg, left foot, left leg, back and forehead.  I am starting her on oxycodone.  Plan is to control her pain today and likely discharge tomorrow.  Insurance authorization and SNF has been arranged.  DVT prophylaxis:   Coumadin   Code Status: Full Code  Family Communication:  None present at bedside.  Plan of care discussed with patient in length and he/she verbalized understanding and agreed with it.  Status is: Inpatient Remains inpatient appropriate because: Awaiting SNF placement   Estimated body mass index is 25.66  kg/m as calculated from the following:   Height as of this encounter: '5\' 4"'$  (1.626 m).   Weight as of this encounter: 67.8 kg.    Nutritional Assessment: Body mass index is 25.66 kg/m.Marland Kitchen Seen by dietician.  I agree with the assessment and plan as outlined below: Nutrition Status:        . Skin Assessment: I have examined the patient's skin and I agree with the wound assessment as performed by the wound care RN as outlined below:    Consultants:  Cardiology-signed off  Procedures:  None  Antimicrobials:  Anti-infectives (From admission, onward)    Start     Dose/Rate Route Frequency Ordered Stop   10/03/21 1800  azithromycin (ZITHROMAX) 500 mg in sodium chloride 0.9 % 250 mL IVPB  Status:  Discontinued        500 mg 250 mL/hr over 60 Minutes Intravenous Every 24 hours 10/02/21 2001 10/03/21 1451   10/03/21 1000  cefTRIAXone (ROCEPHIN) 2 g in sodium chloride 0.9 % 100 mL IVPB  Status:  Discontinued        2 g 200 mL/hr over 30 Minutes Intravenous Every 24 hours 10/02/21 2001 10/03/21 1451   10/02/21 1645  cefTRIAXone (ROCEPHIN) 1 g in sodium chloride 0.9 % 100 mL IVPB        1 g 200 mL/hr over 30 Minutes Intravenous  Once 10/02/21 1635 10/02/21 1801   10/02/21 1645  azithromycin (ZITHROMAX) 500 mg in sodium chloride 0.9 % 250 mL IVPB        500 mg 250 mL/hr over 60 Minutes Intravenous  Once 10/02/21 1635 10/02/21 1919         Subjective:  Seen and examined.  She is fully alert and oriented.  Complains of pain.  Objective: Vitals:   10/04/21 2148 10/05/21 0441 10/05/21 0834 10/05/21 1308  BP: (!) 173/76 (!) 173/87 (!) 171/92 (!) 140/56  Pulse: 81 73 67   Resp: (!) 21 (!) 21 20   Temp: 99.4 F (37.4 C) 99 F (37.2 C)    TempSrc: Oral Oral    SpO2: 94% 92% 93%   Weight:  67.8 kg    Height:        Intake/Output Summary (Last 24 hours) at 10/05/2021 1316 Last data filed at 10/05/2021 0843 Gross per 24 hour  Intake 200 ml  Output 800 ml  Net -600 ml   Filed  Weights   10/03/21 0403 10/04/21 0442 10/05/21 0441  Weight: 68.4 kg 69.8 kg 67.8 kg    Examination:  General exam: Appears calm and comfortable, has laceration on the left forehead which has a stitches. Respiratory system: Clear to auscultation. Respiratory effort normal. Cardiovascular system: S1 & S2 heard, RRR. No JVD, murmurs, rubs, gallops or clicks. No pedal edema. Gastrointestinal system: Abdomen is nondistended, soft and nontender. No organomegaly or masses felt. Normal bowel sounds heard. Central nervous system: Alert and oriented. No focal neurological deficits. Extremities: Symmetric 5 x 5 power. Skin: Multiple bruises in upper and lower extremities.   Data Reviewed: I have personally reviewed following labs and imaging studies  CBC: Recent Labs  Lab 10/02/21 1356 10/02/21 1430 10/03/21 0349 10/04/21 0435 10/05/21 0351  WBC 16.5*  --  13.2* 11.5* 11.1*  NEUTROABS  --   --  8.5*  --  7.4  HGB 13.1 13.9 12.0 11.3* 12.5  HCT 41.3 41.0 36.2 34.1* 36.6  MCV 93.4  --  92.3 91.9 90.1  PLT 186  --  165 157 833   Basic Metabolic Panel: Recent Labs  Lab 10/02/21 1356 10/02/21 1430 10/03/21 0349 10/04/21 0435  NA 140 139 137 135  K 4.4 4.3 3.8 3.9  CL 105 103 105 104  CO2 25  --  24 24  GLUCOSE 121* 118* 117* 99  BUN 20 24* 18 15  CREATININE 0.85 0.80 0.70 0.62  CALCIUM 9.2  --  8.7* 8.5*  MG  --   --  2.0  --   PHOS  --   --  2.6  --    GFR: Estimated Creatinine Clearance: 42.4 mL/min (by C-G formula based on SCr of 0.62 mg/dL). Liver Function Tests: Recent Labs  Lab 10/02/21 1356 10/03/21 0349  AST 20 17  ALT 15 13  ALKPHOS 106 90  BILITOT 1.1 0.8  PROT 7.2 6.2*  ALBUMIN 3.8 3.3*   No results for input(s): LIPASE, AMYLASE in the last 168 hours. No results for input(s): AMMONIA in the last 168 hours. Coagulation Profile: Recent Labs  Lab 10/02/21 1356 10/03/21 0349 10/04/21 0435 10/05/21 0351  INR 1.7* 1.7* 2.2* 2.2*   Cardiac Enzymes: No  results for input(s): CKTOTAL, CKMB, CKMBINDEX, TROPONINI in the last 168 hours. BNP (last 3 results) No results for input(s): PROBNP in the last 8760 hours. HbA1C: No results for input(s): HGBA1C in the last 72 hours. CBG: No results for input(s): GLUCAP in the last 168 hours. Lipid Profile: No results for input(s): CHOL, HDL, LDLCALC, TRIG, CHOLHDL, LDLDIRECT in the last 72 hours. Thyroid Function Tests: No results for input(s): TSH, T4TOTAL, FREET4, T3FREE, THYROIDAB in the last 72 hours. Anemia Panel: No results for input(s): VITAMINB12, FOLATE, FERRITIN, TIBC, IRON, RETICCTPCT in the last 72 hours. Sepsis Labs: Recent Labs  Lab 10/02/21 1733 10/03/21 0349 10/05/21 0351  PROCALCITON  --  0.17 0.10  LATICACIDVEN 0.9  --   --     Recent Results (from the past 240 hour(s))  Resp Panel by RT-PCR (Flu A&B, Covid) Anterior Nasal Swab     Status: None   Collection Time: 10/02/21  8:01 PM   Specimen: Anterior Nasal Swab  Result Value Ref Range Status   SARS Coronavirus 2 by RT PCR NEGATIVE NEGATIVE Final    Comment: (NOTE) SARS-CoV-2 target nucleic acids are NOT DETECTED.  The SARS-CoV-2 RNA is generally detectable in upper respiratory specimens during the acute phase of infection. The lowest concentration of SARS-CoV-2 viral copies this assay can detect is 138 copies/mL. A negative result does not preclude SARS-Cov-2 infection and should not be used as the sole basis for treatment or other patient management decisions. A negative result may occur with  improper specimen collection/handling, submission of specimen other than nasopharyngeal swab, presence of viral mutation(s) within the areas targeted by this assay, and inadequate number of viral copies(<138 copies/mL). A negative result must be combined with clinical observations, patient history, and epidemiological information. The expected result is Negative.  Fact Sheet for Patients:   EntrepreneurPulse.com.au  Fact Sheet for Healthcare Providers:  IncredibleEmployment.be  This test is no t yet approved or cleared by the [place] FDA and  has been authorized for detection and/or diagnosis of SARS-CoV-2 by FDA under an Emergency Use Authorization (EUA). This EUA will remain  in  effect (meaning this test can be used) for the duration of the COVID-19 declaration under Section 564(b)(1) of the Act, 21 U.S.C.section 360bbb-3(b)(1), unless the authorization is terminated  or revoked sooner.       Influenza A by PCR NEGATIVE NEGATIVE Final   Influenza B by PCR NEGATIVE NEGATIVE Final    Comment: (NOTE) The Xpert Xpress SARS-CoV-2/FLU/RSV plus assay is intended as an aid in the diagnosis of influenza from Nasopharyngeal swab specimens and should not be used as a sole basis for treatment. Nasal washings and aspirates are unacceptable for Xpert Xpress SARS-CoV-2/FLU/RSV testing.  Fact Sheet for Patients: EntrepreneurPulse.com.au  Fact Sheet for Healthcare Providers: IncredibleEmployment.be  This test is not yet approved or cleared by the [place] FDA and has been authorized for detection and/or diagnosis of SARS-CoV-2 by FDA under an Emergency Use Authorization (EUA). This EUA will remain in effect (meaning this test can be used) for the duration of the COVID-19 declaration under Section 564(b)(1) of the Act, 21 U.S.C. section 360bbb-3(b)(1), unless the authorization is terminated or revoked.  Performed at [facility], [place] [address] [place], [place] [address]      Radiology Studies: No results found.  Scheduled Meds:  acidophilus  1 capsule Oral Daily   amLODipine  5 mg Oral Daily   anastrozole  1 mg Oral Daily   vitamin C  1,000 mg Oral Daily   cholecalciferol  5,000 Units Oral Daily   clotrimazole   Topical BID   ferrous sulfate  325 mg Oral Q breakfast    hydrOXYzine  100 mg Oral QHS   irbesartan  300 mg Oral Daily   vitamin B-12  1,000 mcg Oral Daily   [START ON 10/06/2021] warfarin  3 mg Oral Once per day on Sun Tue Thu   warfarin  4.5 mg Oral Once per day on Mon Wed Fri Sat   Warfarin - Pharmacist Dosing Inpatient   Does not apply q1600   Continuous Infusions:   LOS: 3 days   Darliss Cheney, MD [facility]  10/05/2021, 1:16 PM   *Please note that this is a verbal dictation therefore any spelling or grammatical errors are due to the "[place] One" system interpretation.  Please page via [place] and do not message via secure chat for urgent patient care matters. Secure chat can be used for non urgent patient care matters.  How to contact the [facility] Attending or Consulting provider [place] or covering provider during after hours [place], for this patient?  Check the care team in [facility] and look for a) attending/consulting TRH provider listed and b) the [facility] team listed. Page or secure chat [phone]. Log into [email or web address] and use [place]'s universal password to access. If you do not have the password, please contact the hospital operator. Locate the [facility] provider you are looking for under [facility] and page to a number that you can be directly reached. If you still have difficulty reaching the provider, please page the [facility] (Director on Call) for the Hospitalists listed on amion for assistance.

## 2021-10-05 NOTE — Progress Notes (Signed)
Physical Therapy Treatment Patient Details Name: Misty Blackwell MRN: [id number] DOB: 11-21-1927 Today's Date: 10/05/2021   History of Present Illness 88 y.o. female with medical history significant for permanent A-fib on Coumadin, vertigo, sick sinus syndrome status post pacemaker placement, iron deficiency anemia, hypertension, who presented to [facility] ED via EMS after a fall when walking out of a store due to presumed syncopal episode.  Found to have facial lacerations.    PT Comments    Patient with L upper back pain now limiting L shoulder ROM, but better with AAROM activities this session.  She did demonstrate some positional vertigo returning to supine even though HOB was up, but remains too painful to attempt to test or treat.  She progressed with ambulation some in the room as eager to mobilize to help her with having a BM.  Niece in the room and supportive.  Hopeful for transition to STSNF for rehab soon.  Will follow until d/c.   Recommendations for follow up therapy are one component of a multi-disciplinary discharge planning process, led by the attending physician.  Recommendations may be updated based on patient status, additional functional criteria and insurance authorization.  Follow Up Recommendations  Skilled nursing-short term rehab (<3 hours/day)     Assistance Recommended at Discharge Frequent or constant Supervision/Assistance  Patient can return home with the following Assist for transportation;Assistance with cooking/housework;A little help with walking and/or transfers;A little help with bathing/dressing/bathroom;Help with stairs or ramp for entrance   Equipment Recommendations  None recommended by PT    Recommendations for Other Services       Precautions / Restrictions Precautions Precautions: Fall Precaution Comments: h/o vertigo, neuropathy bilateral feet     Mobility  Bed Mobility Overal bed mobility: Needs Assistance Bed Mobility: Supine to Sit      Supine to sit: HOB elevated, Min guard Sit to supine: Min assist   General bed mobility comments: patient pulling up on rail and provided minguard for balance, to supine assist for leg onto bed    Transfers Overall transfer level: Needs assistance Equipment used: Rolling walker (2 wheels) Transfers: Sit to/from Stand, Bed to chair/wheelchair/BSC Sit to Stand: Min assist   Step pivot transfers: Min assist       General transfer comment: assist with RW and cues for hand placement, stepped to [facility] as incontinent of urine; stood from [facility] and from toilet with min A as well    Ambulation/Gait Ambulation/Gait assistance: Min assist Gait Distance (Feet): 20 Feet (&12') Assistive device: Rolling walker (2 wheels) Gait Pattern/deviations: Step-through pattern, Decreased stride length, Antalgic, Knees buckling       General Gait Details: L knee buckling some in stance and pt relates pain; walked to door then around to bathroom; then back to bed after toileting   Stairs             Wheelchair Mobility    Modified Rankin (Stroke Patients Only)       Balance Overall balance assessment: Needs assistance   Sitting balance-Leahy Scale: Fair     Standing balance support: Reliant on assistive device for balance Standing balance-Leahy Scale: Poor                              Cognition Arousal/Alertness: Awake/alert Behavior During Therapy: Anxious Overall Cognitive Status: Within Functional Limits for tasks assessed  Exercises Other Exercises Other Exercises: L shoulder AAROM flexion x 5 increasing ROM each repitition Other Exercises: scapular squeezes in supine x 5 sec hold x 5 Other Exercises: supine rows with L AAROM x 7    General Comments General comments (skin integrity, edema, etc.): Patient c/o L upper back pain with L shoulder movement even into breast; noted no bruising on breast.   Incontinent upon standing and pure wick not staying in place so obtained BSC and pt pivoted, then walked to bathroom for BM.  Neice in the room and combing pt's hair, reports got bed at facility 3 minutes from her and will sign papers today.      Pertinent Vitals/Pain Pain Assessment Faces Pain Scale: Hurts whole lot Pain Location: L upper back and shoulder Pain Descriptors / Indicators: Aching, Sore Pain Intervention(s): Monitored during session, Repositioned    Home Living                          Prior Function            PT Goals (current goals can now be found in the care plan section) Progress towards PT goals: Progressing toward goals    Frequency    Min 3X/week      PT Plan Current plan remains appropriate    Co-evaluation              AM-PAC PT "6 Clicks" Mobility   Outcome Measure  Help needed turning from your back to your side while in a flat bed without using bedrails?: A Little Help needed moving from lying on your back to sitting on the side of a flat bed without using bedrails?: A Little Help needed moving to and from a bed to a chair (including a wheelchair)?: A Little Help needed standing up from a chair using your arms (e.g., wheelchair or bedside chair)?: A Little Help needed to walk in hospital room?: A Little Help needed climbing 3-5 steps with a railing? : Total 6 Click Score: 16    End of Session Equipment Utilized During Treatment: Gait belt Activity Tolerance: Patient tolerated treatment well Patient left: in bed;with call bell/phone within reach;with bed alarm set   PT Visit Diagnosis: Other abnormalities of gait and mobility (R26.89);History of falling (Z91.81);Other symptoms and signs involving the nervous system (R29.898);Pain Pain - Right/Left: Left Pain - part of body: Shoulder     Time: 0092-3300 PT Time Calculation (min) (ACUTE ONLY): 45 min  Charges:  $Gait Training: 8-22 mins $Therapeutic Exercise: 8-22  mins $Therapeutic Activity: 8-22 mins                     Magda Kiel, PT Acute Rehabilitation Services TMAUQ:[phone] Office:[phone] 10/05/2021    Reginia Naas 10/05/2021, 5:40 PM

## 2021-10-05 NOTE — Care Management Important Message (Signed)
Important Message  Patient Details  Name: Misty Blackwell MRN: [id number] Date of Birth: 01/13/1928   Medicare Important Message Given:  Yes     Shelda Altes 10/05/2021, 10:48 AM

## 2021-10-05 NOTE — TOC Progression Note (Addendum)
Transition of Care [facility]) - Progression Note    Patient Details  Name: Misty Blackwell MRN: [id number] Date of Birth: Sep 19, 1927  Transition of Care [facility]) CM/SW Misty Blackwell, [place] Phone Number: 10/05/2021, 11:24 AM  Clinical Narrative:     CSW called pt's niece Misty Blackwell and provided SNF bed offers. Misty Blackwell was concerned with low star ratings on offers and requested referrals to facilities closer to her home in [place]. She requested referrals to [facility] and [facility]. CSW explained [facility] only accepts their private residents and he would send referrals to [place].   CSW sent referrals; received confirmation that [facility] could accept.   1100: CSW called Misty Blackwell back and explained [facility] could accept and Misty Blackwell is pending. She wants to go with [facility] as it is close to her home and she likes their reputation. CSW explained auth and admission process. She is available to complete paperwork if needed.   CSW confirmed choice with pt. She explains she agrees with all of her nieces decisions regarding SNF process.   CSW notified [facility] of choice and started SNF auth. Auth pending at this time.    1200: Auth approved 6/5-- 6/7. Spoke with MD and pt will stay overnight for pain control. Anticipated DC tomorrow. SNF and niece notified.   Expected Discharge Plan: Skilled Nursing Facility Barriers to Discharge: Insurance Authorization  Expected Discharge Plan and Services Expected Discharge Plan: [place]       Living arrangements for the past 2 months: Single Family Home                                       Social Determinants of Health (SDOH) Interventions    Readmission Risk Interventions     View : No data to display.

## 2021-10-06 ENCOUNTER — Inpatient Hospital Stay (HOSPITAL_COMMUNITY): Payer: Medicare Other

## 2021-10-06 DIAGNOSIS — R55 Syncope and collapse: Secondary | ICD-10-CM

## 2021-10-06 LAB — PROTIME-INR
INR: 2.5 — ABNORMAL HIGH (ref 0.8–1.2)
Prothrombin Time: 26.5 seconds — ABNORMAL HIGH (ref 11.4–15.2)

## 2021-10-06 MED ORDER — OXYCODONE HCL 5 MG PO TABS: 5.0000 mg | ORAL_TABLET | Freq: Four times a day (QID) | ORAL | 0 refills | Status: DC | PRN

## 2021-10-06 NOTE — Progress Notes (Signed)
[place] for warfarin Indication: atrial fibrillation  Allergies  Allergen Reactions   Latex Itching   Ace Inhibitors Other (See Comments)    Unknown reaction   Augmentin [Amoxicillin-Pot Clavulanate] Other (See Comments)    Unknown   Ciprofloxacin Other (See Comments)    Unknown    Levaquin [Levofloxacin In D5w] Other (See Comments)    Unknown   Zocor [Simvastatin] Other (See Comments)    Unknown    Acrylic Polymer [Carbomer] Itching   Chocolate Other (See Comments)    Migraines    Gabapentin Other (See Comments)    Unsteady gait     Patient Measurements: Height: '5\' 4"'$  (162.6 cm) Weight: 66.7 kg (147 lb 0.8 oz) IBW/kg (Calculated) : 54.7  Vital Signs: Temp: 98.5 F (36.9 C) (06/06 0517) Temp Source: Oral (06/06 0517) BP: 149/60 (06/06 0600) Pulse Rate: 65 (06/06 0600)  Labs: Recent Labs    10/04/21 0435 10/05/21 0351 10/06/21 0429  HGB 11.3* 12.5  --   HCT 34.1* 36.6  --   PLT 157 192  --   LABPROT 24.2* 24.6* 26.5*  INR 2.2* 2.2* 2.5*  CREATININE 0.62  --   --      Estimated Creatinine Clearance: 42.1 mL/min (by C-G formula based on SCr of 0.62 mg/dL).  Assessment: 50 yof with a history of AF w/ slow ventriular response and medtronic PM, TIA, HTN, CAD, HF presenting after a syncopal episode in store parking lot. Presented to ED with forehead laceration. Warfarin per pharmacy consult placed for AF. CT head with no acute injury.  Patient takes warfarin prior to arrival. Home dose is 3 mg (3 mg x 1) every Sun, Tue, Thu; 4.5 mg (3 mg x 1.5) all other days. Warfarin last taken 6/1 per patient's medication history.  INR remains therapeutic (2.5). No bleeding noted.  Goal of Therapy:  INR 2-3 Monitor platelets by anticoagulation protocol: Yes   Plan:  Continue home warfarin regimen -  3 mg every Sun, Tue, Thu and 4.5 mg all other days Monitor daily INR and H/H Monitor for s/s of bleeding  Vennesa Bastedo A. Levada Dy,  PharmD, BCPS, FNKF Clinical Pharmacist [place] Please utilize Amion for appropriate phone number to reach the unit pharmacist ([place])  10/06/2021 7:11 AM

## 2021-10-06 NOTE — Progress Notes (Signed)
Carotid duplex bilateral study completed.   Please see CV Proc for preliminary results.   Shelsie Tijerino, RDMS, RVT  

## 2021-10-06 NOTE — Discharge Summary (Signed)
PatientPhysician Discharge Summary  Misty Blackwell NWG:[id number] DOB: 09/09/1927 DOA: 10/02/2021  PCP: [name], MD  Admit date: 10/02/2021 Discharge date: 10/06/2021 30 Day Unplanned Readmission Risk Score    Flowsheet Row ED to Hosp-Admission (Current) from 10/02/2021 in [facility]  30 Day Unplanned Readmission Risk Score (%) 21.02 Filed at 10/06/2021 0801       This score is the patient's risk of an unplanned readmission within 30 days of being discharged (0 -100%). The score is based on dignosis, age, lab data, medications, orders, and past utilization.   Low:  0-14.9   Medium: 15-21.9   High: 22-29.9   Extreme: 30 and above          Admitted From: Home Disposition: SNF  Recommendations for Outpatient Follow-up:  Follow up with PCP in 1-2 weeks Please obtain BMP/CBC in one week Follow-up with cardiology as a scheduled  Please follow up with your PCP on the following pending results: Unresulted Labs (From admission, onward)     Start     Ordered   10/03/21 0500  Protime-INR  Daily,   R      10/02/21 2027              Home Health: None Equipment/Devices: None  Discharge Condition: Stable CODE STATUS: Full code Diet recommendation: Cardiac  Subjective: Seen and examined.  Now she feels better.  No pain.  No other complaint.  She is agreeable with discharge plan today.  Brief/Interim Summary: Misty Blackwell is a 88 y.o. female with medical history significant for permanent A-fib on Coumadin, sick sinus syndrome status post pacemaker placement, iron deficiency anemia, hypertension, who presented to [facility] ED via EMS after a syncopal episode while walking out of the store. No prodrome or seizure like activity.  Patient was hemodynamically stable upon presentation, admitted to hospital service for further work-up of syncope. Per niece, patient likely had a mechanical fall.  She notes that patient may have tripped over an object and then  fell to the ground.  She had recent echo, but no significant valvular disease or abnormalities that would explain her syncope.  Head CT was negative for any acute cranial findings. -Cardiology consulted, pacemaker interrogated without any arrhythmia, recommending carotic doppler which is completed but result is still pending but patient is totally asymptomatic now, prior CTA 2016 no significant disease Orthostatic vital signs positive with OT.  Encouraged p.o. intake.per family pt supposed to use walker but only uses cane.  Seen by PT OT who recommends SNF.  Patient agreeable. Cardiology signed off.  Patient is being discharged in stable condition today.  Patient's fall was likely mechanical but possibly could have been due to orthostatic hypotension as well.  Currently stable.  Cardiology recommended resuming all home medications.   Left facial injury post fall from syncope Left scalp hematoma noted on CT head -Has stitches on the laceration on left forehead.  Post repair in the ED -Local wound care -Analgesics PRN.  Recommend removing the stitches 5 days since placement which will be tomorrow on 10/07/2021.   Thoracic back pain CT reassuring, no fracture. Suspect MSK pain, she is still complaining of severe pain. -Analgesics PRN, will add oxycodone today.   Right lower lobe pneumonia vs. aspiration pneumonitis New peribronchovascular nodularity and consolidation in the right lower lobe, indicative of bronchopneumonia or aspiration.  Patient has elevated white blood count, no fever, no respiratory symptoms.  Suspected aspiration pneumonitis after her fall.  Her procalcitonin  level was 0.17.  -IV azithromycin and Rocephin initiated in the ED. antibiotics were stopped on 10/03/2021 and pneumonia was not presumed, however patient had fever of 102.1 2 nights ago, she also has leukocytosis which is improving.  Patient has no shortness of breath and she is not hypoxic.  Procalcitonin unremarkable.  Now  afebrile for more than 48 hours.     Permanent A-fib on Coumadin INR therapeutic.  Rate controlled.  Resume PTA dose of Coumadin.   Sick sinus syndrome status post pacemaker placement Pacemaker was interrogated, was unrevealing   HFpEF 60 to 65% Last 2D echo done on 05/13/2021 showed LVEF 60 to 65% with grade 2 diastolic dysfunction.Euvolemic on exam   History of breast cancer Resume home anastrozole   Iron deficiency anemia Hemoglobin stable Resume home iron supplement   Generalized pain: Secondary to fall.  No fractures noted.  Pain controlled on low-dose of oxycodone, discharging on few more days of oxycodone as needed.  Patient was asked if she would like for [place] to contact the family and inform them however she chose to call them and update them by herself.  Discharge Diagnoses:  Principal Problem:   Syncope Active Problems:   Chronic atrial fibrillation (HCC)   Scalp hematoma   Forehead laceration   Back pain    Discharge Instructions   Allergies as of 10/06/2021       Reactions   Latex Itching   Ace Inhibitors Other (See Comments)   Unknown reaction   Augmentin [amoxicillin-pot Clavulanate] Other (See Comments)   Unknown   Ciprofloxacin Other (See Comments)   Unknown   Levaquin [levofloxacin In D5w] Other (See Comments)   Unknown   Zocor [simvastatin] Other (See Comments)   Unknown   Acrylic Polymer [carbomer] Itching   Chocolate Other (See Comments)   Migraines   Gabapentin Other (See Comments)   Unsteady gait         Medication List     TAKE these medications    acetaminophen 325 MG tablet Commonly known as: TYLENOL Take 325-650 mg by mouth every 6 (six) hours as needed for mild pain or headache.   acidophilus Caps capsule Take 1 capsule by mouth daily.   anastrozole 1 MG tablet Commonly known as: ARIMIDEX TAKE 1 TABLET(1 MG) BY MOUTH DAILY What changed: See the new instructions.   clotrimazole-betamethasone cream Commonly known as:  LOTRISONE APPLY TOPICALLY TO THE AFFECTED AREA TWICE DAILY What changed: See the new instructions.   diltiazem 240 MG 24 hr capsule Commonly known as: CARDIZEM CD Take 1 capsule (240 mg total) by mouth daily.   Ferrous Sulfate 90 (18 Fe) MG Tabs Take 18 mg by mouth daily with breakfast.   furosemide 20 MG tablet Commonly known as: LASIX Take 2 tablets (40 mg total) by mouth daily. What changed:  how much to take when to take this   hydrOXYzine 50 MG tablet Commonly known as: ATARAX TAKE 1 TO 2 TABLETS BY MOUTH EVERY NIGHT 1 HOUR BEFORE BEDTIME AS NEEDED FOR SLEEP What changed: See the new instructions.   irbesartan 300 MG tablet Commonly known as: AVAPRO TAKE 1 TABLET(300 MG) BY MOUTH DAILY What changed: See the new instructions.   Magnesium 250 MG Tabs Take 250 mg by mouth daily.   OVER THE COUNTER MEDICATION Apply 1 application. topically See admin instructions. Neuropathy maximum strength Nerve relief and recovery cream- Apply to the feet at bedtime   oxyCODONE 5 MG immediate release tablet Commonly known as: Oxy  IR/ROXICODONE Take 1 tablet (5 mg total) by mouth every 6 (six) hours as needed for breakthrough pain.   potassium chloride SA 20 MEQ tablet Commonly known as: KLOR-CON M Take  1 tablet  2 x /day  for Potassium What changed:  how much to take how to take this when to take this additional instructions   VITAMIN B 12 PO Take 1,000 mcg by mouth daily.   vitamin C 1000 MG tablet Take 1,000 mg by mouth daily.   Vitamin D-3 125 MCG (5000 UT) Tabs Take 5,000 Units by mouth daily.   warfarin 3 MG tablet Commonly known as: COUMADIN Take as directed. If you are unsure how to take this medication, talk to your nurse or doctor. Original instructions: TAKE 1 TO 1 AND 1/2 TABLET BY MOUTH AS DIRECTED   zinc gluconate 50 MG tablet Take 50 mg by mouth daily.        Follow-up Information     [name], MD Follow up in 1 week(s).   Specialty:  Internal Medicine Contact information: [address] [place] [address] [phone]         Sanda Klein, MD .   Specialty: Cardiology Contact information: [address] [place] [address] [phone]         Sanda Klein, MD Follow up on 11/30/2021.   Specialty: Cardiology Contact information: [address] [place] [address] [phone]                Allergies  Allergen Reactions   Latex Itching   Ace Inhibitors Other (See Comments)    Unknown reaction   Augmentin [Amoxicillin-Pot Clavulanate] Other (See Comments)    Unknown   Ciprofloxacin Other (See Comments)    Unknown    Levaquin [Levofloxacin In D5w] Other (See Comments)    Unknown   Zocor [Simvastatin] Other (See Comments)    Unknown    Acrylic Polymer [Carbomer] Itching   Chocolate Other (See Comments)    Migraines    Gabapentin Other (See Comments)    Unsteady gait     Consultations: Cardiology   Procedures/Studies: CT Head Wo Contrast  Result Date: 10/02/2021 CLINICAL DATA:  Head trauma, moderate-severe EXAM: CT HEAD WITHOUT CONTRAST CT MAXILLOFACIAL WITHOUT CONTRAST TECHNIQUE: Multidetector CT imaging of the head and maxillofacial structures were performed using the standard protocol without intravenous contrast. Multiplanar CT image reconstructions of the maxillofacial structures were also generated. RADIATION DOSE REDUCTION: This exam was performed according to the departmental dose-optimization program which includes automated exposure control, adjustment of the mA and/or kV according to patient size and/or use of iterative reconstruction technique. COMPARISON:  December 2021 FINDINGS: CT HEAD FINDINGS Brain: No acute intracranial hemorrhage, mass effect or edema. Gray-white differentiation is preserved. Prominence of the ventricles and sulci reflects similar parenchymal volume loss. Minimal patchy hypoattenuation in the  supratentorial white matter is nonspecific but may reflect similar minor chronic microvascular ischemic changes. No extra-axial collection. Vascular: There is intracranial atherosclerotic calcification at the skull base. Skull: Unremarkable. Other: Left frontal scalp hematoma. CT MAXILLOFACIAL FINDINGS Osseous: No acute facial fracture. Degenerative changes at the left temporomandibular joint. Orbits: No intraorbital hematoma. Sinuses: Mild mucosal thickening. Soft tissues: Left frontal scalp and periorbital soft tissue swelling with hematoma and laceration. IMPRESSION: No evidence of acute intracranial injury.  No acute facial fracture. Electronically Signed   By: Macy Mis M.D.   On: 10/02/2021 14:48   CT Cervical Spine Wo Contrast  Result Date:  10/02/2021 CLINICAL DATA:  Polytrauma, blunt; Low back pain, trauma; Mid-back pain. EXAM: CT CERVICAL, THORACIC, AND LUMBAR SPINE WITHOUT CONTRAST TECHNIQUE: Multidetector CT imaging of the cervical, thoracic and lumbar spine was performed without intravenous contrast. Multiplanar CT image reconstructions were also generated. RADIATION DOSE REDUCTION: This exam was performed according to the departmental dose-optimization program which includes automated exposure control, adjustment of the mA and/or kV according to patient size and/or use of iterative reconstruction technique. COMPARISON:  CT angiography chest 05/12/2021, CT cervical spine 04/05/2020 FINDINGS: CT CERVICAL SPINE FINDINGS Alignment: Normal. Skull base and vertebrae: Multilevel degenerative changes spine most prominent at the C4-C5 level. Associated severe right osseous neural foraminal stenosis at the C4-C5 level. No severe osseous central canal stenosis. No acute fracture. No aggressive appearing focal osseous lesion or focal pathologic process. Soft tissues and spinal canal: No prevertebral fluid or swelling. No visible canal hematoma. Upper chest: Biapical pleural/pulmonary scarring. Other: None.  CT THORACIC SPINE FINDINGS Alignment: Normal. Vertebrae: No acute fracture or focal pathologic process. Paraspinal and other soft tissues: Negative. Disc levels: Multilevel neural intervertebral disc space vacuum phenomenon. Otherwise maintained. CT LUMBAR SPINE FINDINGS Segmentation: 5 lumbar type vertebrae. Alignment: Levocurvature of the lumbar spine centered at the L2-L3 level may be positional. Vertebrae: Multilevel osteophyte formation and facet arthropathy. Multilevel posterior disc osteophyte complex. No severe osseous neural foraminal or central canal stenosis. No acute fracture or focal pathologic process. Paraspinal and other soft tissues: Negative. Disc levels: Multilevel intervertebral disc space narrowing and vacuum phenomenon. Other: Atherosclerotic plaque. IMPRESSION: 1. No acute displaced fracture or traumatic listhesis of the cervical spine. 2. No acute displaced fracture or traumatic listhesis of the thoracic spine. 3. No acute displaced fracture or traumatic listhesis of the lumbar spine. 4. Severe osseous neural foraminal stenosis at the right C4-C5 level. 5. Please see separately dictated CT head, chest, abdomen, pelvis 10/02/2021. 6.  Aortic Atherosclerosis (ICD10-I70.0). Electronically Signed   By: Iven Finn M.D.   On: 10/02/2021 15:20   CT Lumbar Spine Wo Contrast  Result Date: 10/02/2021 CLINICAL DATA:  Polytrauma, blunt; Low back pain, trauma; Mid-back pain. EXAM: CT CERVICAL, THORACIC, AND LUMBAR SPINE WITHOUT CONTRAST TECHNIQUE: Multidetector CT imaging of the cervical, thoracic and lumbar spine was performed without intravenous contrast. Multiplanar CT image reconstructions were also generated. RADIATION DOSE REDUCTION: This exam was performed according to the departmental dose-optimization program which includes automated exposure control, adjustment of the mA and/or kV according to patient size and/or use of iterative reconstruction technique. COMPARISON:  CT angiography  chest 05/12/2021, CT cervical spine 04/05/2020 FINDINGS: CT CERVICAL SPINE FINDINGS Alignment: Normal. Skull base and vertebrae: Multilevel degenerative changes spine most prominent at the C4-C5 level. Associated severe right osseous neural foraminal stenosis at the C4-C5 level. No severe osseous central canal stenosis. No acute fracture. No aggressive appearing focal osseous lesion or focal pathologic process. Soft tissues and spinal canal: No prevertebral fluid or swelling. No visible canal hematoma. Upper chest: Biapical pleural/pulmonary scarring. Other: None. CT THORACIC SPINE FINDINGS Alignment: Normal. Vertebrae: No acute fracture or focal pathologic process. Paraspinal and other soft tissues: Negative. Disc levels: Multilevel neural intervertebral disc space vacuum phenomenon. Otherwise maintained. CT LUMBAR SPINE FINDINGS Segmentation: 5 lumbar type vertebrae. Alignment: Levocurvature of the lumbar spine centered at the L2-L3 level may be positional. Vertebrae: Multilevel osteophyte formation and facet arthropathy. Multilevel posterior disc osteophyte complex. No severe osseous neural foraminal or central canal stenosis. No acute fracture or focal pathologic process. Paraspinal and other soft tissues: Negative. Disc  levels: Multilevel intervertebral disc space narrowing and vacuum phenomenon. Other: Atherosclerotic plaque. IMPRESSION: 1. No acute displaced fracture or traumatic listhesis of the cervical spine. 2. No acute displaced fracture or traumatic listhesis of the thoracic spine. 3. No acute displaced fracture or traumatic listhesis of the lumbar spine. 4. Severe osseous neural foraminal stenosis at the right C4-C5 level. 5. Please see separately dictated CT head, chest, abdomen, pelvis 10/02/2021. 6.  Aortic Atherosclerosis (ICD10-I70.0). Electronically Signed   By: Iven Finn M.D.   On: 10/02/2021 15:20   DG Pelvis Portable  Result Date: 10/02/2021 CLINICAL DATA:  Fall, level 2 trauma. EXAM:  PORTABLE PELVIS 1-2 VIEWS COMPARISON:  None Available. FINDINGS: There is no evidence of pelvic fracture or diastasis. No pelvic bone lesions are seen. IMPRESSION: No acute osseous abnormality. Electronically Signed   By: Dahlia Bailiff M.D.   On: 10/02/2021 14:25   CT CHEST ABDOMEN PELVIS W CONTRAST  Result Date: 10/02/2021 CLINICAL DATA:  Fall with chest pain and abdominal pain, on Coumadin. EXAM: CT CHEST, ABDOMEN, AND PELVIS WITH CONTRAST TECHNIQUE: Multidetector CT imaging of the chest, abdomen and pelvis was performed following the standard protocol during bolus administration of intravenous contrast. RADIATION DOSE REDUCTION: This exam was performed according to the departmental dose-optimization program which includes automated exposure control, adjustment of the mA and/or kV according to patient size and/or use of iterative reconstruction technique. CONTRAST:  79m OMNIPAQUE IOHEXOL 300 MG/ML  SOLN COMPARISON:  CT chest 05/12/2021. FINDINGS: CT CHEST FINDINGS Cardiovascular: Atherosclerotic calcification of the aorta and coronary arteries. Enlarged pulmonic trunk and heart. No pericardial effusion. Mediastinum/Nodes: 1.8 cm minimally hypodense nodule in the right thyroid. No follow-up recommended unless clinically warranted. (Ref: J Am Coll Radiol. 2015 Feb;12(2): 143-50). Mediastinal lymph nodes are not enlarged by CT size criteria. No hilar or axillary adenopathy. Esophagus is grossly unremarkable. Lungs/Pleura: Pulmonary nodules measure up to 6 mm in the right middle lobe (4/116), as on 05/12/2021. Patchy peribronchovascular nodularity and consolidation in the right lower lobe, new. No pleural fluid. Airway is unremarkable. Musculoskeletal: Degenerative changes in the spine.  No fracture. CT ABDOMEN PELVIS FINDINGS Hepatobiliary: Liver margin is mildly irregular. Cholecystectomy. No biliary ductal dilatation. Pancreas: Negative. Spleen: Negative. Adrenals/Urinary Tract: Adrenal glands are  unremarkable. Parenchymal thinning and scarring in the kidneys. Subcentimeter lesions in the left kidney, too small to characterize. No specific follow-up necessary. Ureters are decompressed. Bladder is grossly unremarkable. Stomach/Bowel: Stomach, small bowel and colon are unremarkable. Appendix is not readily visualized. Vascular/Lymphatic: Atherosclerotic calcification of the aorta. No pathologically enlarged lymph nodes. Reproductive: Hysterectomy.  No adnexal mass. Other: No free fluid.  Mesenteries and peritoneum are unremarkable. Musculoskeletal: Degenerative changes in the spine. No worrisome lytic or sclerotic lesions. IMPRESSION: 1. No evidence of acute trauma to the chest, abdomen or pelvis. 2. New peribronchovascular nodularity and consolidation in the right lower lobe, indicative of bronchopneumonia or aspiration. Difficult to definitively exclude pulmonary contusion. 3. Mild marginal regulated the liver raises suspicion for cirrhosis. 4.  Aortic atherosclerosis (ICD10-I70.0). Electronically Signed   By: MLorin PicketM.D.   On: 10/02/2021 15:21   CT T-SPINE NO CHARGE  Result Date: 10/02/2021 CLINICAL DATA:  Polytrauma, blunt; Low back pain, trauma; Mid-back pain. EXAM: CT CERVICAL, THORACIC, AND LUMBAR SPINE WITHOUT CONTRAST TECHNIQUE: Multidetector CT imaging of the cervical, thoracic and lumbar spine was performed without intravenous contrast. Multiplanar CT image reconstructions were also generated. RADIATION DOSE REDUCTION: This exam was performed according to the departmental dose-optimization program which includes automated exposure  control, adjustment of the mA and/or kV according to patient size and/or use of iterative reconstruction technique. COMPARISON:  CT angiography chest 05/12/2021, CT cervical spine 04/05/2020 FINDINGS: CT CERVICAL SPINE FINDINGS Alignment: Normal. Skull base and vertebrae: Multilevel degenerative changes spine most prominent at the C4-C5 level. Associated severe  right osseous neural foraminal stenosis at the C4-C5 level. No severe osseous central canal stenosis. No acute fracture. No aggressive appearing focal osseous lesion or focal pathologic process. Soft tissues and spinal canal: No prevertebral fluid or swelling. No visible canal hematoma. Upper chest: Biapical pleural/pulmonary scarring. Other: None. CT THORACIC SPINE FINDINGS Alignment: Normal. Vertebrae: No acute fracture or focal pathologic process. Paraspinal and other soft tissues: Negative. Disc levels: Multilevel neural intervertebral disc space vacuum phenomenon. Otherwise maintained. CT LUMBAR SPINE FINDINGS Segmentation: 5 lumbar type vertebrae. Alignment: Levocurvature of the lumbar spine centered at the L2-L3 level may be positional. Vertebrae: Multilevel osteophyte formation and facet arthropathy. Multilevel posterior disc osteophyte complex. No severe osseous neural foraminal or central canal stenosis. No acute fracture or focal pathologic process. Paraspinal and other soft tissues: Negative. Disc levels: Multilevel intervertebral disc space narrowing and vacuum phenomenon. Other: Atherosclerotic plaque. IMPRESSION: 1. No acute displaced fracture or traumatic listhesis of the cervical spine. 2. No acute displaced fracture or traumatic listhesis of the thoracic spine. 3. No acute displaced fracture or traumatic listhesis of the lumbar spine. 4. Severe osseous neural foraminal stenosis at the right C4-C5 level. 5. Please see separately dictated CT head, chest, abdomen, pelvis 10/02/2021. 6.  Aortic Atherosclerosis (ICD10-I70.0). Electronically Signed   By: Iven Finn M.D.   On: 10/02/2021 15:20   DG Chest Port 1 View  Result Date: 10/02/2021 CLINICAL DATA:  Fall EXAM: PORTABLE CHEST 1 VIEW COMPARISON:  05/12/2021 FINDINGS: Chronic interstitial changes. No new consolidation or edema. No pleural effusion or pneumothorax. Stable cardiomegaly. Left chest wall dual lead pacemaker. Included osseous  structures are grossly intact. IMPRESSION: No acute process in the chest. Electronically Signed   By: Macy Mis M.D.   On: 10/02/2021 15:06   CT Maxillofacial Wo Contrast  Result Date: 10/02/2021 CLINICAL DATA:  Head trauma, moderate-severe EXAM: CT HEAD WITHOUT CONTRAST CT MAXILLOFACIAL WITHOUT CONTRAST TECHNIQUE: Multidetector CT imaging of the head and maxillofacial structures were performed using the standard protocol without intravenous contrast. Multiplanar CT image reconstructions of the maxillofacial structures were also generated. RADIATION DOSE REDUCTION: This exam was performed according to the departmental dose-optimization program which includes automated exposure control, adjustment of the mA and/or kV according to patient size and/or use of iterative reconstruction technique. COMPARISON:  December 2021 FINDINGS: CT HEAD FINDINGS Brain: No acute intracranial hemorrhage, mass effect or edema. Gray-white differentiation is preserved. Prominence of the ventricles and sulci reflects similar parenchymal volume loss. Minimal patchy hypoattenuation in the supratentorial white matter is nonspecific but may reflect similar minor chronic microvascular ischemic changes. No extra-axial collection. Vascular: There is intracranial atherosclerotic calcification at the skull base. Skull: Unremarkable. Other: Left frontal scalp hematoma. CT MAXILLOFACIAL FINDINGS Osseous: No acute facial fracture. Degenerative changes at the left temporomandibular joint. Orbits: No intraorbital hematoma. Sinuses: Mild mucosal thickening. Soft tissues: Left frontal scalp and periorbital soft tissue swelling with hematoma and laceration. IMPRESSION: No evidence of acute intracranial injury.  No acute facial fracture. Electronically Signed   By: Macy Mis M.D.   On: 10/02/2021 14:48     Discharge Exam: Vitals:   10/06/21 0600 10/06/21 0804  BP: (!) 149/60   Pulse: 65   Resp: 19 (!)  3  Temp:  98.9 F (37.2 C)  SpO2:  92% 99%   Vitals:   10/06/21 0500 10/06/21 0517 10/06/21 0600 10/06/21 0804  BP:  (!) 162/66 (!) 149/60   Pulse:  75 65   Resp:  20 19 (!) 3  Temp:  98.5 F (36.9 C)  98.9 F (37.2 C)  TempSrc:  Oral  Oral  SpO2:  93% 92% 99%  Weight: 66.7 kg     Height:        General: Pt is alert, awake, not in acute distress, has laceration and stitches on the left forehead Cardiovascular: RRR, S1/S2 +, no rubs, no gallops Respiratory: CTA bilaterally, no wheezing, no rhonchi Abdominal: Soft, NT, ND, bowel sounds + Extremities: no edema, no cyanosis, has multiple bruises in bilateral upper and lower extremities.    The results of significant diagnostics from this hospitalization (including imaging, microbiology, ancillary and laboratory) are listed below for reference.     Microbiology: Recent Results (from the past 240 hour(s))  Resp Panel by RT-PCR (Flu A&B, Covid) Anterior Nasal Swab     Status: None   Collection Time: 10/02/21  8:01 PM   Specimen: Anterior Nasal Swab  Result Value Ref Range Status   SARS Coronavirus 2 by RT PCR NEGATIVE NEGATIVE Final    Comment: (NOTE) SARS-CoV-2 target nucleic acids are NOT DETECTED.  The SARS-CoV-2 RNA is generally detectable in upper respiratory specimens during the acute phase of infection. The lowest concentration of SARS-CoV-2 viral copies this assay can detect is 138 copies/mL. A negative result does not preclude SARS-Cov-2 infection and should not be used as the sole basis for treatment or other patient management decisions. A negative result may occur with  improper specimen collection/handling, submission of specimen other than nasopharyngeal swab, presence of viral mutation(s) within the areas targeted by this assay, and inadequate number of viral copies(<138 copies/mL). A negative result must be combined with clinical observations, patient history, and epidemiological information. The expected result is Negative.  Fact Sheet for  Patients:  EntrepreneurPulse.com.au  Fact Sheet for Healthcare Providers:  IncredibleEmployment.be  This test is no t yet approved or cleared by the [place] FDA and  has been authorized for detection and/or diagnosis of SARS-CoV-2 by FDA under an Emergency Use Authorization (EUA). This EUA will remain  in effect (meaning this test can be used) for the duration of the COVID-19 declaration under Section 564(b)(1) of the Act, 21 U.S.C.section 360bbb-3(b)(1), unless the authorization is terminated  or revoked sooner.       Influenza A by PCR NEGATIVE NEGATIVE Final   Influenza B by PCR NEGATIVE NEGATIVE Final    Comment: (NOTE) The Xpert Xpress SARS-CoV-2/FLU/RSV plus assay is intended as an aid in the diagnosis of influenza from Nasopharyngeal swab specimens and should not be used as a sole basis for treatment. Nasal washings and aspirates are unacceptable for Xpert Xpress SARS-CoV-2/FLU/RSV testing.  Fact Sheet for Patients: EntrepreneurPulse.com.au  Fact Sheet for Healthcare Providers: IncredibleEmployment.be  This test is not yet approved or cleared by the [place] FDA and has been authorized for detection and/or diagnosis of SARS-CoV-2 by FDA under an Emergency Use Authorization (EUA). This EUA will remain in effect (meaning this test can be used) for the duration of the COVID-19 declaration under Section 564(b)(1) of the Act, 21 U.S.C. section 360bbb-3(b)(1), unless the authorization is terminated or revoked.  Performed at [facility], [place] [address] [place], [place] [address]  Labs: BNP (last 3 results) Recent Labs    05/12/21 1855 05/18/21 0923  BNP 167.7* 19.4   Basic Metabolic Panel: Recent Labs  Lab 10/02/21 1356 10/02/21 1430 10/03/21 0349 10/04/21 0435  NA 140 139 137 135  K 4.4 4.3 3.8 3.9  CL 105 103 105 104  CO2 25  --  24 24  GLUCOSE 121* 118* 117*  99  BUN 20 24* 18 15  CREATININE 0.85 0.80 0.70 0.62  CALCIUM 9.2  --  8.7* 8.5*  MG  --   --  2.0  --   PHOS  --   --  2.6  --    Liver Function Tests: Recent Labs  Lab 10/02/21 1356 10/03/21 0349  AST 20 17  ALT 15 13  ALKPHOS 106 90  BILITOT 1.1 0.8  PROT 7.2 6.2*  ALBUMIN 3.8 3.3*   No results for input(s): LIPASE, AMYLASE in the last 168 hours. No results for input(s): AMMONIA in the last 168 hours. CBC: Recent Labs  Lab 10/02/21 1356 10/02/21 1430 10/03/21 0349 10/04/21 0435 10/05/21 0351  WBC 16.5*  --  13.2* 11.5* 11.1*  NEUTROABS  --   --  8.5*  --  7.4  HGB 13.1 13.9 12.0 11.3* 12.5  HCT 41.3 41.0 36.2 34.1* 36.6  MCV 93.4  --  92.3 91.9 90.1  PLT 186  --  165 157 192   Cardiac Enzymes: No results for input(s): CKTOTAL, CKMB, CKMBINDEX, TROPONINI in the last 168 hours. BNP: Invalid input(s): POCBNP CBG: No results for input(s): GLUCAP in the last 168 hours. D-Dimer No results for input(s): DDIMER in the last 72 hours. Hgb A1c No results for input(s): HGBA1C in the last 72 hours. Lipid Profile No results for input(s): CHOL, HDL, LDLCALC, TRIG, CHOLHDL, LDLDIRECT in the last 72 hours. Thyroid function studies No results for input(s): TSH, T4TOTAL, T3FREE, THYROIDAB in the last 72 hours.  Invalid input(s): FREET3 Anemia work up No results for input(s): VITAMINB12, FOLATE, FERRITIN, TIBC, IRON, RETICCTPCT in the last 72 hours. Urinalysis    Component Value Date/Time   COLORURINE YELLOW 10/02/2021 1930   APPEARANCEUR CLEAR 10/02/2021 1930   LABSPEC 1.041 (H) 10/02/2021 1930   PHURINE 6.0 10/02/2021 1930   GLUCOSEU NEGATIVE 10/02/2021 1930   HGBUR NEGATIVE 10/02/2021 1930   BILIRUBINUR NEGATIVE 10/02/2021 1930   KETONESUR NEGATIVE 10/02/2021 1930   PROTEINUR NEGATIVE 10/02/2021 1930   UROBILINOGEN 1.0 08/24/2013 1509   NITRITE NEGATIVE 10/02/2021 1930   LEUKOCYTESUR TRACE (A) 10/02/2021 1930   Sepsis Labs Invalid input(s): PROCALCITONIN,   WBC,  LACTICIDVEN Microbiology Recent Results (from the past 240 hour(s))  Resp Panel by RT-PCR (Flu A&B, Covid) Anterior Nasal Swab     Status: None   Collection Time: 10/02/21  8:01 PM   Specimen: Anterior Nasal Swab  Result Value Ref Range Status   SARS Coronavirus 2 by RT PCR NEGATIVE NEGATIVE Final    Comment: (NOTE) SARS-CoV-2 target nucleic acids are NOT DETECTED.  The SARS-CoV-2 RNA is generally detectable in upper respiratory specimens during the acute phase of infection. The lowest concentration of SARS-CoV-2 viral copies this assay can detect is 138 copies/mL. A negative result does not preclude SARS-Cov-2 infection and should not be used as the sole basis for treatment or other patient management decisions. A negative result may occur with  improper specimen collection/handling, submission of specimen other than nasopharyngeal swab, presence of viral mutation(s) within the areas targeted by this assay, and inadequate number of viral  copies(<138 copies/mL). A negative result must be combined with clinical observations, patient history, and epidemiological information. The expected result is Negative.  Fact Sheet for Patients:  EntrepreneurPulse.com.au  Fact Sheet for Healthcare Providers:  IncredibleEmployment.be  This test is no t yet approved or cleared by the [place] FDA and  has been authorized for detection and/or diagnosis of SARS-CoV-2 by FDA under an Emergency Use Authorization (EUA). This EUA will remain  in effect (meaning this test can be used) for the duration of the COVID-19 declaration under Section 564(b)(1) of the Act, 21 U.S.C.section 360bbb-3(b)(1), unless the authorization is terminated  or revoked sooner.       Influenza A by PCR NEGATIVE NEGATIVE Final   Influenza B by PCR NEGATIVE NEGATIVE Final    Comment: (NOTE) The Xpert Xpress SARS-CoV-2/FLU/RSV plus assay is intended as an aid in the diagnosis  of influenza from Nasopharyngeal swab specimens and should not be used as a sole basis for treatment. Nasal washings and aspirates are unacceptable for Xpert Xpress SARS-CoV-2/FLU/RSV testing.  Fact Sheet for Patients: EntrepreneurPulse.com.au  Fact Sheet for Healthcare Providers: IncredibleEmployment.be  This test is not yet approved or cleared by the [place] FDA and has been authorized for detection and/or diagnosis of SARS-CoV-2 by FDA under an Emergency Use Authorization (EUA). This EUA will remain in effect (meaning this test can be used) for the duration of the COVID-19 declaration under Section 564(b)(1) of the Act, 21 U.S.C. section 360bbb-3(b)(1), unless the authorization is terminated or revoked.  Performed at [facility], [place] [address], [place], [place] [address]      Time coordinating discharge: Over 30 minutes  SIGNED:   Darliss Cheney, MD  [facility] 10/06/2021, 9:44 AM *Please note that this is a verbal dictation therefore any spelling or grammatical errors are due to the "[employer] [place] [employer]" system interpretation. If 7PM-7AM, please contact night-coverage [email or web address]

## 2021-10-20 ENCOUNTER — Telehealth: Payer: Self-pay | Admitting: Cardiovascular Disease

## 2021-10-20 NOTE — Telephone Encounter (Signed)
Pt c/o medication issue:  1. Name of Medication:   warfarin (COUMADIN) 3 MG tablet    diltiazem (CARDIZEM CD) 240 MG 24 hr capsule    2. How are you currently taking this medication (dosage and times per day)?   3. Are you having a reaction (difficulty breathing--STAT)? No  4. What is your medication issue? Pt's niece states that pt was recently in the hospital and they have done some adjustments to pt's medication that has her concerned. Niece also states that hospital called this morning stating that pt's INR was 1.75. She would like a call back as soon as possible. Please advise

## 2021-10-20 NOTE — Telephone Encounter (Signed)
Returned call to pt's niece and she stated the pt was hospitalized due to fall and PNA, pt went to rehab for 10 days and brought her home yesterday. She states the pt's INR from the rehab was 1.72 or 1.75 and that they told her that today when they called. She states they gave her a bunch of pills and confirmed that she now has '1mg'$  tab and a 2.'5mg'$  tab and a lot of paperwork. She is taking 3.'5mg'$  daily. Advised that we should check her INR this week and monitor closely. She will bring the pt Thursday and will bring paperwork, pills, and the patient to the appt.

## 2021-10-22 ENCOUNTER — Ambulatory Visit (INDEPENDENT_AMBULATORY_CARE_PROVIDER_SITE_OTHER): Payer: Medicare Other

## 2021-10-22 DIAGNOSIS — Z7901 Long term (current) use of anticoagulants: Secondary | ICD-10-CM | POA: Diagnosis not present

## 2021-10-22 DIAGNOSIS — I482 Chronic atrial fibrillation, unspecified: Secondary | ICD-10-CM

## 2021-10-22 LAB — POCT INR: INR: 1.9 — AB (ref 2.0–3.0)

## 2021-10-22 NOTE — Patient Instructions (Addendum)
Description   Resume taking 1.5 tablets daily except 1 tablet Sunday, Tuesday and Thursday.  Repeat INR in 10 days.  Call if you have questions [phone]

## 2021-10-27 NOTE — Progress Notes (Signed)
[facility] Follow-Up  Future Appointments  Date Time Provider Department  10/28/2021  2:30 PM [name], MD [facility]  11/23/2021        6 Mo OV  9:30 AM [name], MD [facility]  11/30/2021 10:40 AM [name], [name], MD [facility]  02/22/2022  8:15 AM [name], MD [facility]  04/29/2022        CPE   3:00 PM [name], MD [facility]  06/28/2022  9:30 AM [name], MD [facility]  08/20/2022          Wellness  9:00 AM [name] [facility]        This very nice 88 y.o. [name] was admitted to the hospital on  10/02/2021  and patient was discharged from the hospital on 10/06/2021  . The patient now presents for follow up for transition from recent hospitalization &  SNF /Rehab stay.  The day after discharge  from the [place] center,  our clinical staff contacted the patient to assure stability and schedule a follow up appointment. The discharge summary, medications and diagnostic test results were reviewed before meeting with the patient. The patient was admitted for:   Concussion with loss of consciousness of 30 minutes or less, sequela (HCC) Laceration of forehead Hematoma of scalp, sequela - Persistent atrial fibrillation (HCC)  SSS (sick sinus syndrome) (HCC) Cardiac pacemaker Chronic anticoagulation      The patient is a very nice 88 yo [name] on Warfarin for Ch Afib, who tripped inj a "[employer]" on the threshold and & had a mechanical fall sustaining an apparent concussion and LOC awakening up in an ambulance in transient to the ER. She apparently sustained a scalp hematoma & a laceration of the Lt forehead which was sutured. Brain , facial  & cervical CT scans were negative. PT was initiated during hospitalization. She was transferred to [place] for ongoing PT for 10 days through 10/16/2021. Since return home 12 days  on 10/16/2021 , she has been receiving PT at home 2 x/week for 2 weeks for balance & strengthening.       Hospitalization discharge instructions and medications are reconciled with the patient and her niece in attendance who transports patients to appointments. .      Patient is also followed with Hypertension, Hyperlipidemia, Pre-Diabetes and Vitamin D Deficiency.      Patient is treated for HTN & BP has been controlled at home. Today's BP is at goal -  140/84. Patient has had no complaints of any cardiac type chest pain, palpitations, dyspnea/ orthopnea/ PND, dizziness, claudication, or dependent edema.     Hyperlipidemia is controlled with diet & meds. Patient denies myalgias or other med SE's. Last Lipids were at goal:  Lab Results  Component Value Date   CHOL 165 04/28/2021   HDL 47 (L) 04/28/2021   LDLCALC 97 04/28/2021   TRIG 115 04/28/2021   CHOLHDL 3.5 04/28/2021      Also, the patient has history of PreDiabetes and has had no symptoms of reactive hypoglycemia, diabetic polys, paresthesias or visual blurring.  Last A1c was at goal:  Lab Results  Component Value Date   HGBA1C 5.6 04/28/2021      Further, the patient also has history of Vitamin D Deficiency and supplements vitamin D without any suspected side-effects. Last vitamin D was  at goal:  Lab Results  Component Value Date   VD25OH 59 04/28/2021   Current Outpatient Medications on File Prior to Visit  Medication Sig  acetaminophen (TYLENOL) 325 MG tablet Take 325-650 mg by mouth every 6 (six) hours as needed for mild pain or headache.   acidophilus (RISAQUAD) CAPS capsule Take 1 capsule by mouth daily.   anastrozole (ARIMIDEX) 1 MG tablet TAKE 1 TABLET(1 MG) BY MOUTH DAILY (Patient taking differently: Take 1 mg by mouth daily.)   Ascorbic Acid (VITAMIN C) 1000 MG tablet Take 1,000 mg by mouth daily.    Cholecalciferol (VITAMIN D-3) 125 MCG (5000 UT) TABS Take 5,000 Units by mouth daily.   clotrimazole-betamethasone (LOTRISONE) cream APPLY TOPICALLY TO THE AFFECTED AREA TWICE DAILY (Patient taking differently:  Apply 1 application  topically 2 (two) times daily as needed (to affected area).)   Cyanocobalamin (VITAMIN B 12 PO) Take 1,000 mcg by mouth daily.    diltiazem (CARDIZEM CD) 240 MG 24 hr capsule Take 1 capsule (240 mg total) by mouth daily.   Ferrous Sulfate 90 (18 Fe) MG TABS Take 18 mg by mouth daily with breakfast.   furosemide (LASIX) 20 MG tablet Take 2 tablets (40 mg total) by mouth daily. (Patient taking differently: Take 20 mg by mouth in the morning and at bedtime.)   hydrOXYzine (ATARAX) 50 MG tablet TAKE 1 TO 2 TABLETS BY MOUTH EVERY NIGHT 1 HOUR BEFORE BEDTIME AS NEEDED FOR SLEEP (Patient taking differently: Take 100 mg by mouth at bedtime.)   irbesartan (AVAPRO) 300 MG tablet TAKE 1 TABLET(300 MG) BY MOUTH DAILY (Patient taking differently: Take 300 mg by mouth daily.)   Magnesium 250 MG TABS Take 250 mg by mouth daily.   OVER THE COUNTER MEDICATION Apply 1 application. topically See admin instructions. Neuropathy maximum strength Nerve relief and recovery cream- Apply to the feet at bedtime   potassium chloride SA (KLOR-CON M) 20 MEQ tablet Take  1 tablet  2 x /day  for Potassium (Patient taking differently: Take 20 mEq by mouth in the morning and at bedtime.)   warfarin (COUMADIN) 3 MG tablet TAKE 1 TO 1 AND 1/2 TABLET BY MOUTH AS DIRECTED   zinc gluconate 50 MG tablet Take 50 mg by mouth daily.   No current facility-administered medications on file prior to visit.   Allergies  Allergen Reactions   Latex Itching   Ace Inhibitors Other (See Comments)    Unknown reaction   Augmentin [Amoxicillin-Pot Clavulanate] Other (See Comments)    Unknown   Ciprofloxacin Other (See Comments)    Unknown    Levaquin [Levofloxacin In D5w] Other (See Comments)    Unknown   Zocor [Simvastatin] Other (See Comments)    Unknown    Acrylic Polymer [Carbomer] Itching   Chocolate Other (See Comments)    Migraines    Gabapentin Other (See Comments)    Unsteady gait    PMHx:   Past Medical  History:  Diagnosis Date   Anxiety    Atrial fib/flutter, transient    Atrial fibrillation, chronic (HCC)    CHF (congestive heart failure) ([place]) 10/29/2009   Echo - EF >55%; normal LV size and systolic function; unable to assess diastolic fcn due to E/A fusion, pulmonary vein flow pattern suggests elevated filling pressure; marked biatrail dilation, mild/mod tricuspid regurgitation; mod pulmonary htn; mild/mod mitral regurgitation; although echocardiographic features are incomplete findings suggest possible infiltrative cardiomyopathy (maybe amyloidosi   Coronary artery disease 03/19/2002   R/P Cardiolite - EF 76%; nromal static and dynamic myocardial perfusion images; normal wall motion and endocardial thickening in all vascular territories   Dyspnea    Dysrhythmia  Facial numbness 12/26/2008   carotid doppler - R and L ICAs 0-49% diameter reduction (velocities suggest low end of scale)   Hypertension    Pacemaker    Peripheral neuropathy    Pneumonia 2017   Skin cancer    s/p surgical removal.   Stroke ([place]) 04/2017   TIA (transient ischemic attack)    Immunization History  Administered Date(s) Administered   DT (Pediatric) 03/19/2015   Influenza Split 05/04/2011   Influenza, High Dose Seasonal PF 03/19/2014, 12/23/2015, 02/03/2017, 12/26/2018, 01/28/2020   Influenza,inj,quad, With Preservative 05/21/2013   Influenza-Unspecified 02/04/2015, 02/03/2017, 01/28/2020   PFIZER(Purple Top)SARS-COV-2 Vaccination 06/16/2019, 07/09/2019, 03/21/2020   Pneumococcal Conjugate-13 03/19/2014   Pneumococcal Polysaccharide-23 05/04/2011   Pneumococcal-Unspecified 05/03/2001   Td 05/04/2003   Tdap 10/02/2021   Past Surgical History:  Procedure Laterality Date   ABDOMINAL HYSTERECTOMY     APPENDECTOMY     BREAST LUMPECTOMY WITH RADIOACTIVE SEED LOCALIZATION Right 12/13/2019   Procedure: RIGHT BREAST LUMPECTOMY WITH RADIOACTIVE SEED LOCALIZATION;  Surgeon: Donnie Mesa, MD;  Location: [place];  Service: General;  Laterality: Right;  LMA VS MAC   BREAST SURGERY Left [place]  08/06/2005   minimal coronary disease predominant RCA; no significant atherosclerosis; new onset sick sinus syndrome and atrial flutter w/ ventricular response, controlled on med therapy; systemic HTN, normal renal arteries   CARDIOVERSION  11/19/2009   successful DCCV from AF to sinus type rhythm   CHOLECYSTECTOMY     EYE SURGERY Bilateral 2013   PPM GENERATOR CHANGEOUT N/A 08/24/2021   Procedure: PPM GENERATOR CHANGEOUT;  Surgeon: Sanda Klein, MD;  Location: [facility];  Service: Cardiovascular;  Laterality: N/A;   Skin cancer resection     TONSILLECTOMY  1938   FHx:    Reviewed / unchanged  SHx:    Reviewed / unchanged  Systems Review:  Constitutional: Denies fever, chills, wt changes, headaches, insomnia, fatigue, night sweats, change in appetite. Eyes: Denies redness, blurred vision, diplopia, discharge, itchy, watery eyes.  ENT: Denies discharge, congestion, post nasal drip, epistaxis, sore throat, earache, hearing loss, dental pain, tinnitus, vertigo, sinus pain, snoring.  CV: Denies chest pain, palpitations, irregular heartbeat, syncope, dyspnea, diaphoresis, orthopnea, PND, claudication or edema. Respiratory: denies cough, dyspnea, DOE, pleurisy, hoarseness, laryngitis, wheezing.  Gastrointestinal: Denies dysphagia, odynophagia, heartburn, reflux, water brash, abdominal pain or cramps, nausea, vomiting, bloating, diarrhea, constipation, hematemesis, melena, hematochezia  or hemorrhoids. Genitourinary: Denies dysuria, frequency, urgency, nocturia, hesitancy, discharge, hematuria or flank pain. Musculoskeletal: Denies arthralgias, myalgias, stiffness, jt. swelling, pain, limping or strain/sprain.  Skin: Denies pruritus, rash, hives, warts, acne, eczema or change in skin lesion(s). Neuro: No weakness, tremor, incoordination, spasms, paresthesia or pain. Psychiatric:  Denies confusion, memory loss or sensory loss. Endo: Denies change in weight, skin or hair change.  Heme/Lymph: No excessive bleeding, bruising or enlarged lymph nodes.  Physical Exam  BP 140/84   Pulse 76   Temp (!) 97.1 F (36.2 C)   Resp 16   Ht 5' 4.5" (1.638 m)   Wt 145 lb 3.2 oz (65.9 kg)   SpO2 97%   BMI 24.54 kg/m   Appears well nourished, well groomed  and in no distress.  Eyes: PERRLA, EOMs, conjunctiva no swelling or erythema. Sinuses: No frontal/maxillary tenderness ENT/Mouth: EAC's clear, TM's nl w/o erythema, bulging. Nares clear w/o erythema, swelling, exudates. Oropharynx clear without erythema or exudates. Oral hygiene is good. Tongue normal, non obstructing. Hearing intact.  Neck: Supple. Thyroid nl. Car 2+/2+  without bruits, nodes or JVD. Chest: Respirations nl with BS clear & equal w/o rales, rhonchi, wheezing or stridor.  Cor: Heart sounds normal w/ regular rate and rhythm without sig. murmurs, gallops, clicks or rubs. Peripheral pulses normal and equal  without edema.  Abdomen: Soft & bowel sounds normal. Non-tender w/o guarding, rebound, hernias, masses or organomegaly.  Lymphatics: Unremarkable.  Musculoskeletal: Full ROM all peripheral extremities, joint stability, 5/5 strength and normal gait.  Skin: Warm, dry without exposed rashes, lesions or ecchymosis apparent.  Neuro: Cranial nerves intact, reflexes equal bilaterally. Sensory-motor testing grossly intact. Tendon reflexes grossly intact.  Pysch: Alert & oriented x 3.  Insight and judgement nl & appropriate. No ideations.                                                  Discussed  regular exercise, BP monitoring, weight control to achieve/maintain BMI less than 25 and discussed meds and SE's. Recommended labs to assess and monitor clinical status with further disposition pending results of labs. Over 50 minutes of exam, counseling, chart review was performed.   Kirtland Bouchard, MD

## 2021-10-28 ENCOUNTER — Ambulatory Visit (INDEPENDENT_AMBULATORY_CARE_PROVIDER_SITE_OTHER): Payer: Medicare Other | Admitting: Internal Medicine

## 2021-10-28 ENCOUNTER — Encounter: Payer: Self-pay | Admitting: Internal Medicine

## 2021-10-28 VITALS — BP 140/84 | HR 76 | Temp 97.1°F | Resp 16 | Ht 64.5 in | Wt 145.2 lb

## 2021-10-28 DIAGNOSIS — Z7901 Long term (current) use of anticoagulants: Secondary | ICD-10-CM

## 2021-10-28 DIAGNOSIS — I495 Sick sinus syndrome: Secondary | ICD-10-CM

## 2021-10-28 DIAGNOSIS — S0181XD Laceration without foreign body of other part of head, subsequent encounter: Secondary | ICD-10-CM

## 2021-10-28 DIAGNOSIS — I4819 Other persistent atrial fibrillation: Secondary | ICD-10-CM

## 2021-10-28 DIAGNOSIS — Z950 Presence of cardiac pacemaker: Secondary | ICD-10-CM | POA: Diagnosis not present

## 2021-10-28 DIAGNOSIS — S0003XS Contusion of scalp, sequela: Secondary | ICD-10-CM

## 2021-10-28 DIAGNOSIS — S060X1S Concussion with loss of consciousness of 30 minutes or less, sequela: Secondary | ICD-10-CM

## 2021-10-28 LAB — CBC WITH DIFFERENTIAL/PLATELET
Absolute Monocytes: 930 cells/uL (ref 200–950)
Basophils Absolute: 58 cells/uL (ref 0–200)
Basophils Relative: 0.7 %
Eosinophils Absolute: 158 cells/uL (ref 15–500)
Eosinophils Relative: 1.9 %
HCT: 40.5 % (ref 35.0–45.0)
Hemoglobin: 13.2 g/dL (ref 11.7–15.5)
Lymphs Abs: 3030 cells/uL (ref 850–3900)
MCH: 29.2 pg (ref 27.0–33.0)
MCHC: 32.6 g/dL (ref 32.0–36.0)
MCV: 89.6 fL (ref 80.0–100.0)
MPV: 10.9 fL (ref 7.5–12.5)
Monocytes Relative: 11.2 %
Neutro Abs: 4125 cells/uL (ref 1500–7800)
Neutrophils Relative %: 49.7 %
Platelets: 238 10*3/uL (ref 140–400)
RBC: 4.52 10*6/uL (ref 3.80–5.10)
RDW: 12 % (ref 11.0–15.0)
Total Lymphocyte: 36.5 %
WBC: 8.3 10*3/uL (ref 3.8–10.8)

## 2021-10-28 LAB — COMPLETE METABOLIC PANEL WITH GFR
AG Ratio: 1.4 (calc) (ref 1.0–2.5)
ALT: 12 U/L (ref 6–29)
AST: 15 U/L (ref 10–35)
Albumin: 4.2 g/dL (ref 3.6–5.1)
Alkaline phosphatase (APISO): 166 U/L — ABNORMAL HIGH (ref 37–153)
BUN: 23 mg/dL (ref 7–25)
CO2: 31 mmol/L (ref 20–32)
Calcium: 9.8 mg/dL (ref 8.6–10.4)
Chloride: 103 mmol/L (ref 98–110)
Creat: 0.8 mg/dL (ref 0.60–0.95)
Globulin: 2.9 g/dL (calc) (ref 1.9–3.7)
Glucose, Bld: 92 mg/dL (ref 65–99)
Potassium: 5.2 mmol/L (ref 3.5–5.3)
Sodium: 140 mmol/L (ref 135–146)
Total Bilirubin: 0.4 mg/dL (ref 0.2–1.2)
Total Protein: 7.1 g/dL (ref 6.1–8.1)
eGFR: 69 mL/min/{1.73_m2} (ref >=60)

## 2021-10-28 LAB — MAGNESIUM: Magnesium: 2.3 mg/dL (ref 1.5–2.5)

## 2021-10-28 NOTE — Patient Instructions (Signed)
Atrial Fibrillation  Atrial fibrillation is a type of irregular or rapid heartbeat (arrhythmia). In atrial fibrillation, the top part of the heart (atria) beats in an irregular pattern. This makes the heart unable to pump blood normally and effectively. The goal of treatment is to prevent blood clots from forming, control your heart rate, or restore your heartbeat to a normal rhythm. If this condition is not treated, it can cause serious problems, such as a weakened heart muscle (cardiomyopathy) or a stroke. What are the causes? This condition is often caused by medical conditions that damage the heart's electrical system. These include: High blood pressure (hypertension). This is the most common cause. Certain heart problems or conditions, such as heart failure, coronary artery disease, heart valve problems, or heart surgery. Diabetes. Overactive thyroid (hyperthyroidism). Obesity. Chronic kidney disease. In some cases, the cause of this condition is not known. What increases the risk? This condition is more likely to develop in: Older people. People who smoke. Athletes who do endurance exercise. People who have a family history of atrial fibrillation. Men. People who use drugs. People who drink a lot of alcohol. People who have lung conditions, such as emphysema, pneumonia, or COPD. People who have obstructive sleep apnea. What are the signs or symptoms? Symptoms of this condition include: A feeling that your heart is racing or beating irregularly. Discomfort or pain in your chest. Shortness of breath. Sudden light-headedness or weakness. Tiring easily during exercise or activity. Fatigue. Syncope (fainting). Sweating. In some cases, there are no symptoms. How is this diagnosed? Your health care provider may detect atrial fibrillation when taking your pulse. If detected, this condition may be diagnosed with: An electrocardiogram (ECG) to check electrical signals of the  heart. An ambulatory cardiac monitor to record your heart's activity for a few days. A transthoracic echocardiogram (TTE) to create pictures of your heart. A transesophageal echocardiogram (TEE) to create even closer pictures of your heart. A stress test to check your blood supply while you exercise. Imaging tests, such as a CT scan or chest X-ray. Blood tests. How is this treated? Treatment depends on underlying conditions and how you feel when you experience atrial fibrillation. This condition may be treated with: Medicines to prevent blood clots or to treat heart rate or heart rhythm problems. Electrical cardioversion to reset the heart's rhythm. A pacemaker to correct abnormal heart rhythm. Ablation to remove the heart tissue that sends abnormal signals. Left atrial appendage closure to seal the area where blood clots can form. In some cases, underlying conditions will be treated. Follow these instructions at home: Medicines Take over-the counter and prescription medicines only as told by your health care provider. Do not take any new medicines without talking to your health care provider. If you are taking blood thinners: Talk with your health care provider before you take any medicines that contain aspirin or NSAIDs, such as ibuprofen. These medicines increase your risk for dangerous bleeding. Take your medicine exactly as told, at the same time every day. Avoid activities that could cause injury or bruising, and follow instructions about how to prevent falls. Wear a medical alert bracelet or carry a card that lists what medicines you take. Lifestyle     Do not use any products that contain nicotine or tobacco, such as cigarettes, e-cigarettes, and chewing tobacco. If you need help quitting, ask your health care provider. Eat heart-healthy foods. Talk with a dietitian to make an eating plan that is right for you. Exercise regularly as told   by your health care provider. Do not  drink alcohol. Lose weight if you are overweight. Do not use drugs, including cannabis. General instructions If you have obstructive sleep apnea, manage your condition as told by your health care provider. Do not use diet pills unless your health care provider approves. Diet pills can make heart problems worse. Keep all follow-up visits as told by your health care provider. This is important. Contact a health care provider if you: Notice a change in the rate, rhythm, or strength of your heartbeat. Are taking a blood thinner and you notice more bruising. Tire more easily when you exercise or do heavy work. Have a sudden change in weight. Get help right away if you have:  Chest pain, abdominal pain, sweating, or weakness. Trouble breathing. Side effects of blood thinners, such as blood in your vomit, stool, or urine, or bleeding that cannot stop. Any symptoms of a stroke. "BE FAST" is an easy way to remember the main warning signs of a stroke: B - Balance. Signs are dizziness, sudden trouble walking, or loss of balance. E - Eyes. Signs are trouble seeing or a sudden change in vision. F - Face. Signs are sudden weakness or numbness of the face, or the face or eyelid drooping on one side. A - Arms. Signs are weakness or numbness in an arm. This happens suddenly and usually on one side of the body. S - Speech. Signs are sudden trouble speaking, slurred speech, or trouble understanding what people say. T - Time. Time to call emergency services. Write down what time symptoms started. Other signs of a stroke, such as: A sudden, severe headache with no known cause. Nausea or vomiting. Seizure. These symptoms may represent a serious problem that is an emergency. Do not wait to see if the symptoms will go away. Get medical help right away. Call your local emergency services (911 in the [place] Do not drive yourself to the hospital. Summary Atrial fibrillation is a type of irregular or rapid  heartbeat (arrhythmia). Symptoms include a feeling that your heart is beating fast or irregularly. You may be given medicines to prevent blood clots or to treat heart rate or heart rhythm problems. Get help right away if you have signs or symptoms of a stroke. Get help right away if you cannot catch your breath or have chest pain or pressure. This information is not intended to replace advice given to you by your health care provider. Make sure you discuss any questions you have with your health care provider. Document Revised: 10/11/2018 Document Reviewed: 10/11/2018 Elsevier Patient Education  2023 [employer].  

## 2021-10-29 NOTE — Progress Notes (Signed)
<><><><><><><><><><><><><><><><><><><><><><><><><><><><><><><><><> <><><><><><><><><><><><><><><><><><><><><><><><><><><><><><><><><> -   Test results slightly outside the reference range are not unusual. If there is anything important, I will review this with you,  otherwise it is considered normal test values.  If you have further questions,  please do not hesitate to contact me at the office or via My Chart.  <><><><><><><><><><><><><><><><><><><><><><><><><><><><><><><><><> <><><><><><><><><><><><><><><><><><><><><><><><><><><><><><><><><>  -  All  labs -    CBC - Kidneys - Electrolytes - Liver - Magnesium & Thyroid    - all  Normal / OK <><><><><><><><><><><><><><><><><><><><><><><><><><><><><><><><><> <><><><><><><><><><><><><><><><><><><><><><><><><><><><><><><><><>

## 2021-11-02 ENCOUNTER — Ambulatory Visit (INDEPENDENT_AMBULATORY_CARE_PROVIDER_SITE_OTHER): Payer: Medicare Other | Admitting: Pharmacist

## 2021-11-02 DIAGNOSIS — Z7901 Long term (current) use of anticoagulants: Secondary | ICD-10-CM | POA: Diagnosis not present

## 2021-11-02 DIAGNOSIS — I482 Chronic atrial fibrillation, unspecified: Secondary | ICD-10-CM | POA: Diagnosis not present

## 2021-11-02 LAB — POCT INR: INR: 2.4 (ref 2.0–3.0)

## 2021-11-02 NOTE — Patient Instructions (Signed)
Description   Resume taking 1.5 tablets daily except 1 tablet Sunday, Tuesday and Thursday.  Repeat INR in 4 weeks.  Call [phone] with any questions

## 2021-11-22 NOTE — Progress Notes (Signed)
Future Appointments  Date Time Provider Department  11/23/2021                     6 mo ov  9:30 AM [name], MD [facility]  11/30/2021 10:40 AM [name], [name], MD [facility]  02/22/2022  8:15 AM [name], MD [facility]  04/29/2022                    cpe   3:00 PM [name], MD [facility]  06/28/2022  9:30 AM [name], MD [facility]  08/20/2022  9:00 AM [name], MD [facility]     History of Present Illness:       This very nice 88 y.o.[name] presents for 6 month follow up with HTN, HLD, Pre-Diabetes and Vitamin D Deficiency.        Patient is treated for HTN circa 1998 & BP has been controlled and today's BP is  elevated at 150/78.  Heart caths in 1993/2007 showed non-obstructive CAD. 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  In 2007, she had a PPM implanted for SSS.  She's on Coumadin followed by the [facility] coumadin clinic. Patient has had no complaints of any cardiac type chest pain, palpitations, dyspnea Vertell Limber /PND, dizziness, claudication or dependent edema. 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        Hyperlipidemia is controlled with diet.   Patient denies myalgias or other med SE's. Last Lipids off meds were near goal:  Lab Results  Component Value Date   CHOL 165 04/28/2021   HDL 47 (L) 04/28/2021   LDLCALC 97 04/28/2021   TRIG 115 04/28/2021   CHOLHDL 3.5 04/28/2021      Also, the patient has history of PreDiabetes with Insulin Resistance (A1c 5.9% /Insulin elev 86 /2011) and has had no symptoms of reactive hypoglycemia, diabetic polys, paresthesias or visual blurring.  Last A1c was normal & at goal:  Lab Results  Component Value Date   HGBA1C 5.6 04/28/2021                                                     Further, the patient also has history of Vitamin D Deficiency and supplements vitamin D without any suspected side-effects. Last vitamin D was at  goal:   Lab Results  Component Value Date   VD25OH 59 04/28/2021      Current Outpatient Medications:     acetaminophen 325 MG tablet, Take 325-650 mg  every 6 hours as needed    acidophilus (RISAQUAD) CAPS capsule, Take 1 capsule daily   anastrozole (ARIMIDEX) 1 MG tablet, TAKE 1 TABLET DAILY    VITAMIN C 1000 MG tablet, Take  daily   VIT D  5000 u, Take  daily   LOTRISONE cream, APPLY  TWICE DAILY    VITAMIN B 12    1,000 mcg , Take daily   furosemide ( 20 MG tablet, Take 20 mg by mouth in the morning & bedtime.   hydrOXYzine (ATARAX) 50 MG , TAKE 1 TO 2 TABLETS  EVERY NIGHT    irbesartan  300 MG , TAKE 1 TABLET DAILY    Magnesium 250 MG TABS, Take daily   potassium chloride SA 20 MEQ ,  Take  1 tablet  2 x /day  for Potassium   warfarin (COUMADIN) 3 MG , TAKE 1 TO 1 AND 1/2 TABLET  AS DIRECTED   zinc  50 MG tablet, Take  daily   Allergies  Allergen Reactions   Latex Itching   Ace Inhibitors Other (See Comments)    Unknown reaction   Augmentin [Amoxicillin-Pot Clavulanate] Other (See Comments)   Ciprofloxacin Other (See Comments)   Levaquin [Levofloxacin In D5w] Other (See Comments)   Zocor [Simvastatin] Other (See Comments)   Acrylic Polymer [Carbomer] Itching   Chocolate Other (See Comments)    migraine's    Gabapentin Other (See Comments)    Unsteady gait      PMHx:   Past Medical History:  Diagnosis Date   Anxiety    Atrial fib/flutter, transient    Atrial fibrillation, chronic (HCC)    CHF (congestive heart failure) ([place]) 10/29/2009   Echo - EF >55%; normal LV size and systolic function; unable to assess diastolic fcn due to E/A fusion, pulmonary vein flow pattern suggests elevated filling pressure; marked biatrail dilation, mild/mod tricuspid regurgitation; mod pulmonary htn; mild/mod mitral regurgitation; although echocardiographic features are incomplete findings suggest possible infiltrative cardiomyopathy (maybe amyloidosi   Coronary artery disease  03/19/2002   R/P Cardiolite - EF 76%; nromal static and dynamic myocardial perfusion images; normal wall motion and endocardial thickening in all vascular territories   Dyspnea    Dysrhythmia    Facial numbness 12/26/2008   carotid doppler - R and L ICAs 0-49% diameter reduction (velocities suggest low end of scale)   Hypertension    Pacemaker    Peripheral neuropathy    Pneumonia 2017   Skin cancer    s/p surgical removal.   Stroke ([place]) 04/2017   TIA (transient ischemic attack)      Immunization History  Administered Date(s) Administered   DT  03/19/2015   Influenza Split 05/04/2011   Influenza, High Dose  03/19/2014, 12/23/2015, 02/03/2017, 12/26/2018, 01/28/2020   Influenza,inj,quad 05/21/2013   Influenza-  02/04/2015, 02/03/2017, 01/28/2020   PFIZER SARS-COV-2 Vacc 06/16/2019, 07/09/2019, 03/21/2020   Pneumococcal -13 03/19/2014   Pneumococcal -23 05/04/2011   Pneumococcal-23 05/03/2001   Td 05/04/2003     Past Surgical History:  Procedure Laterality Date   ABDOMINAL HYSTERECTOMY     APPENDECTOMY     BREAST LUMPECTOMY WITH RADIOACTIVE SEED LOCALIZATION Right 12/13/2019   Procedure: RIGHT BREAST LUMPECTOMY WITH RADIOACTIVE SEED LOCALIZATION;  Surgeon: Donnie Mesa, MD;  Location: [place];  Service: General;  Laterality: Right;  LMA VS MAC   BREAST SURGERY Left 1949   CARDIAC CATHETERIZATION  08/06/2005   minimal coronary disease predominant RCA; no significant atherosclerosis; new onset sick sinus syndrome and atrial flutter w/ ventricular response, controlled on med therapy; systemic HTN, normal renal arteries   CARDIOVERSION  11/19/2009   successful DCCV from AF to sinus type rhythm   CHOLECYSTECTOMY     EYE SURGERY Bilateral 2013   Skin cancer resection     TONSILLECTOMY  1938    FHx:    Reviewed / unchanged  SHx:    Reviewed / unchanged   Systems Review:  Constitutional: Denies fever, chills, wt changes, headaches, insomnia, fatigue, night sweats, change in  appetite. Eyes: Denies redness, blurred vision, diplopia, discharge, itchy, watery eyes.  ENT: Denies discharge, congestion, post nasal drip, epistaxis, sore throat, earache, hearing loss, dental pain, tinnitus, vertigo, sinus pain, snoring.  CV: Denies chest pain, palpitations, irregular heartbeat, syncope, dyspnea, diaphoresis,  orthopnea, PND, claudication or edema. Respiratory: denies cough, dyspnea, DOE, pleurisy, hoarseness, laryngitis, wheezing.  Gastrointestinal: Denies dysphagia, odynophagia, heartburn, reflux, water brash, abdominal pain or cramps, nausea, vomiting, bloating, diarrhea, constipation, hematemesis, melena, hematochezia  or hemorrhoids. Genitourinary: Denies dysuria, frequency, urgency, nocturia, hesitancy, discharge, hematuria or flank pain. Musculoskeletal: Denies arthralgias, myalgias, stiffness, jt. swelling, pain, limping or strain/sprain.  Skin: Denies pruritus, rash, hives, warts, acne, eczema or change in skin lesion(s). Neuro: No weakness, tremor, incoordination, spasms, paresthesia or pain. Psychiatric: Denies confusion, memory loss or sensory loss. Endo: Denies change in weight, skin or hair change.  Heme/Lymph: No excessive bleeding, bruising or enlarged lymph nodes.  Physical Exam  BP (!) 150/78   Pulse 84   Temp 97.9 F (36.6 C)   Resp 16   Ht 5' 4.5" (1.638 m)   Wt 149 lb (67.6 kg)   SpO2 96%   BMI 25.18 kg/m   Postural BPs            Sitting 157/74   P 67         &         Standing 153/70   P 64  Appears  well nourished and in no distress.  Eyes: PERRLA, EOMs, conjunctiva no swelling or erythema. Sinuses: No frontal/maxillary tenderness ENT/Mouth: EAC's clear, TM's nl w/o erythema, bulging. Nares clear w/o erythema, swelling, exudates. Oropharynx clear without erythema or exudates. Oral hygiene is good. Tongue normal, non obstructing. Hearing intact.  Neck: Supple. Thyroid not palpable. Car 2+/2+ without bruits, nodes or JVD. Chest:  Respirations nl with BS clear & equal w/o rales, rhonchi, wheezing or stridor.  Cor: Heart sounds normal w/ regular rate and rhythm without sig. murmurs, gallops, clicks or rubs. Peripheral pulses normal and equal  without edema.  Abdomen: Soft & bowel sounds normal. Non-tender w/o guarding, rebound, hernias, masses or organomegaly.  Lymphatics: Unremarkable.  Musculoskeletal: Full ROM all peripheral extremities, joint stability, 5/5 strength and normal gait.  Skin: Warm, dry without exposed rashes, lesions or ecchymosis apparent.  Neuro: Cranial nerves intact, reflexes equal bilaterally. Sensory-motor testing grossly intact. Tendon reflexes grossly intact.  Pysch: Alert & oriented x 3.  Insight and judgement nl & appropriate. No ideations.  Assessment and Plan:  1. Essential hypertension  - Despite sl elevation of BPs,   since patient is already on 2 BP meds & a diuretic,  I am reluctant to " overtreat " her BP due to high fall risk .   - Continue medication, monitor blood pressure at home.   - Continue DASH diet.  Reminder to go to the ER if any CP,  SOB, nausea, dizziness, severe HA, changes vision/speech.   - CBC with Differential/Platelet - COMPLETE METABOLIC PANEL WITH GFR - Magnesium - TSH  2. Hyperlipidemia, mixed  - Continue diet/meds, exercise,& lifestyle modifications.  - Continue monitor periodic cholesterol/liver & renal functions    - Lipid panel - TSH  3. Abnormal glucose  - Continue diet, exercise  - Lifestyle modifications.  - Monitor appropriate labs.   - Hemoglobin A1c - Insulin, random  4. Vitamin D deficiency  - Continue supplementation.   - VITAMIN D 25 Hydroxy   5. Chronic atrial fibrillation (HCC)  - TSH  6. Pacemaker   7. Aortic atherosclerosis ([place]) by CXR on 04/03/2020  - Lipid panel  8. Medication management  - CBC with Differential/Platelet - COMPLETE METABOLIC PANEL WITH GFR - Magnesium - Lipid panel - TSH - Hemoglobin  A1c - Insulin, random -  VITAMIN D 25 Hydroxy          Discussed  regular exercise, BP monitoring, weight control to achieve/maintain BMI less than 25 and discussed med and SE's. Recommended labs to assess and monitor clinical status with further disposition pending results of labs.  I discussed the assessment and treatment plan with the patient. The patient was provided an opportunity to ask questions and all were answered. The patient agreed with the plan and demonstrated an understanding of the instructions.  I provided over 30 minutes of exam, counseling, chart review and  complex critical decision making.        The patient was advised to call back or seek an in-person evaluation if the symptoms worsen or if the condition fails to improve as anticipated.   Kirtland Bouchard, MD

## 2021-11-22 NOTE — Patient Instructions (Signed)

## 2021-11-23 ENCOUNTER — Other Ambulatory Visit: Payer: Self-pay | Admitting: Hematology and Oncology

## 2021-11-23 ENCOUNTER — Ambulatory Visit (INDEPENDENT_AMBULATORY_CARE_PROVIDER_SITE_OTHER): Payer: Medicare Other | Admitting: Internal Medicine

## 2021-11-23 ENCOUNTER — Encounter: Payer: Self-pay | Admitting: Internal Medicine

## 2021-11-23 VITALS — BP 150/78 | HR 84 | Temp 97.9°F | Resp 16 | Ht 64.5 in | Wt 149.0 lb

## 2021-11-23 DIAGNOSIS — I10 Essential (primary) hypertension: Secondary | ICD-10-CM

## 2021-11-23 DIAGNOSIS — R2681 Unsteadiness on feet: Secondary | ICD-10-CM

## 2021-11-23 DIAGNOSIS — Z79899 Other long term (current) drug therapy: Secondary | ICD-10-CM

## 2021-11-23 DIAGNOSIS — Z950 Presence of cardiac pacemaker: Secondary | ICD-10-CM

## 2021-11-23 DIAGNOSIS — E559 Vitamin D deficiency, unspecified: Secondary | ICD-10-CM | POA: Diagnosis not present

## 2021-11-23 DIAGNOSIS — Z9889 Other specified postprocedural states: Secondary | ICD-10-CM

## 2021-11-23 DIAGNOSIS — R7309 Other abnormal glucose: Secondary | ICD-10-CM

## 2021-11-23 DIAGNOSIS — I700 Atherosclerosis of aorta: Secondary | ICD-10-CM

## 2021-11-23 DIAGNOSIS — Z9181 History of falling: Secondary | ICD-10-CM

## 2021-11-23 DIAGNOSIS — E782 Mixed hyperlipidemia: Secondary | ICD-10-CM | POA: Diagnosis not present

## 2021-11-23 DIAGNOSIS — I482 Chronic atrial fibrillation, unspecified: Secondary | ICD-10-CM

## 2021-11-23 DIAGNOSIS — I2510 Atherosclerotic heart disease of native coronary artery without angina pectoris: Secondary | ICD-10-CM

## 2021-11-24 ENCOUNTER — Ambulatory Visit (INDEPENDENT_AMBULATORY_CARE_PROVIDER_SITE_OTHER): Payer: Medicare Other

## 2021-11-24 DIAGNOSIS — I495 Sick sinus syndrome: Secondary | ICD-10-CM | POA: Diagnosis not present

## 2021-11-24 LAB — COMPLETE METABOLIC PANEL WITH GFR
AG Ratio: 1.5 (calc) (ref 1.0–2.5)
ALT: 10 U/L (ref 6–29)
AST: 11 U/L (ref 10–35)
Albumin: 4.2 g/dL (ref 3.6–5.1)
Alkaline phosphatase (APISO): 149 U/L (ref 37–153)
BUN: 18 mg/dL (ref 7–25)
CO2: 28 mmol/L (ref 20–32)
Calcium: 9.5 mg/dL (ref 8.6–10.4)
Chloride: 103 mmol/L (ref 98–110)
Creat: 0.8 mg/dL (ref 0.60–0.95)
Globulin: 2.8 g/dL (calc) (ref 1.9–3.7)
Glucose, Bld: 81 mg/dL (ref 65–99)
Potassium: 4.5 mmol/L (ref 3.5–5.3)
Sodium: 141 mmol/L (ref 135–146)
Total Bilirubin: 0.5 mg/dL (ref 0.2–1.2)
Total Protein: 7 g/dL (ref 6.1–8.1)
eGFR: 69 mL/min/{1.73_m2} (ref >=60)

## 2021-11-24 LAB — CUP PACEART REMOTE DEVICE CHECK
Battery Remaining Longevity: 161 mo
Battery Voltage: 3.21 V
Brady Statistic AP VP Percent: 0 %
Brady Statistic AP VS Percent: 0 %
Brady Statistic AS VP Percent: 80.67 %
Brady Statistic AS VS Percent: 19.33 %
Brady Statistic RA Percent Paced: 0 %
Brady Statistic RV Percent Paced: 80.67 %
Date Time Interrogation Session: 20230724212826
Implantable Lead Implant Date: 20110428
Implantable Lead Implant Date: 20110428
Implantable Lead Location: 753859
Implantable Lead Location: 753860
Implantable Lead Model: 4092
Implantable Lead Model: 4592
Implantable Pulse Generator Implant Date: 20230424
Lead Channel Impedance Value: 456 Ohm
Lead Channel Impedance Value: 494 Ohm
Lead Channel Impedance Value: 551 Ohm
Lead Channel Impedance Value: 589 Ohm
Lead Channel Pacing Threshold Amplitude: 0.75 V
Lead Channel Pacing Threshold Pulse Width: 0.4 ms
Lead Channel Sensing Intrinsic Amplitude: 1.375 mV
Lead Channel Sensing Intrinsic Amplitude: 8.875 mV
Lead Channel Sensing Intrinsic Amplitude: 8.875 mV
Lead Channel Setting Pacing Amplitude: 2 V
Lead Channel Setting Pacing Pulse Width: 0.4 ms
Lead Channel Setting Sensing Sensitivity: 1.2 mV

## 2021-11-24 LAB — CBC WITH DIFFERENTIAL/PLATELET
Absolute Monocytes: 884 cells/uL (ref 200–950)
Basophils Absolute: 53 cells/uL (ref 0–200)
Basophils Relative: 0.8 %
Eosinophils Absolute: 59 cells/uL (ref 15–500)
Eosinophils Relative: 0.9 %
HCT: 40.6 % (ref 35.0–45.0)
Hemoglobin: 13.3 g/dL (ref 11.7–15.5)
Lymphs Abs: 1940 cells/uL (ref 850–3900)
MCH: 30 pg (ref 27.0–33.0)
MCHC: 32.8 g/dL (ref 32.0–36.0)
MCV: 91.4 fL (ref 80.0–100.0)
MPV: 10.5 fL (ref 7.5–12.5)
Monocytes Relative: 13.4 %
Neutro Abs: 3663 cells/uL (ref 1500–7800)
Neutrophils Relative %: 55.5 %
Platelets: 261 10*3/uL (ref 140–400)
RBC: 4.44 10*6/uL (ref 3.80–5.10)
RDW: 12.8 % (ref 11.0–15.0)
Total Lymphocyte: 29.4 %
WBC: 6.6 10*3/uL (ref 3.8–10.8)

## 2021-11-24 LAB — HEMOGLOBIN A1C
Hgb A1c MFr Bld: 5.4 % of total Hgb (ref <5.7)
Mean Plasma Glucose: 108 mg/dL
eAG (mmol/L): 6 mmol/L

## 2021-11-24 LAB — LIPID PANEL
Cholesterol: 155 mg/dL (ref <200)
HDL: 44 mg/dL — ABNORMAL LOW (ref >=50)
LDL Cholesterol (Calc): 81 mg/dL (calc)
Non-HDL Cholesterol (Calc): 111 mg/dL (calc) (ref <130)
Total CHOL/HDL Ratio: 3.5 (calc) (ref <5.0)
Triglycerides: 204 mg/dL — ABNORMAL HIGH (ref <150)

## 2021-11-24 LAB — INSULIN, RANDOM: Insulin: 19.4 u[IU]/mL — ABNORMAL HIGH

## 2021-11-24 LAB — VITAMIN D 25 HYDROXY (VIT D DEFICIENCY, FRACTURES): Vit D, 25-Hydroxy: 54 ng/mL (ref 30–100)

## 2021-11-24 LAB — TSH: TSH: 1.36 mIU/L (ref 0.40–4.50)

## 2021-11-24 NOTE — Progress Notes (Signed)
<><><><><><><><><><><><><><><><><><><><><><><><><><><><><><><><><> <><><><><><><><><><><><><><><><><><><><><><><><><><><><><><><><><> -   Test results slightly outside the reference range are not unusual. If there is anything important, I will review this with you,  otherwise it is considered normal test values.  If you have further questions,  please do not hesitate to contact me at the office or via My Chart.  <><><><><><><><><><><><><><><><><><><><><><><><><><><><><><><><><> <><><><><><><><><><><><><><><><><><><><><><><><><><><><><><><><><> - Total Chol = 155  - Great  - Bad LDL Chol = 81 - Both   Great  !  - But  Triglycerides (  204  ) or fats in blood are too high                 (   Ideal or  Goal is less than 150  !  )    - Recommend avoid fried & greasy foods,  sweets / candy,   - Avoid white rice  (brown or wild rice or Quinoa is OK),   - Avoid white potatoes  (sweet potatoes are OK)   - Avoid anything made from white flour  - bagels, doughnuts, rolls, buns, biscuits, white and   wheat breads, pizza crust and traditional  pasta made of white flour & egg white  - (vegetarian pasta or spinach or wheat pasta is OK).    - Multi-grain bread is OK - like multi-grain flat bread or  sandwich thins.   - Avoid alcohol in excess.   - Exercise is also important. <><><><><><><><><><><><><><><><><><><><><><><><><><><><><><><><><> <><><><><><><><><><><><><><><><><><><><><><><><><><><><><><><><><>  -  A1c = 5.4% - Great - No Diabetes ! <><><><><><><><><><><><><><><><><><><><><><><><><><><><><><><><><>  -  Vitamuin D = 50  _  "OK"  - Vitamin D goal is between 70-100.   - Please make sure that you are taking your Vitamin D as directed.   - It is very important as a natural anti-inflammatory and helping the  immune system protect against viral infections, like the Covid-19    helping hair, skin, and nails, as well as reducing stroke and  heart attack risk.   - It helps  your bones and helps with mood.  - It also decreases numerous cancer risks so please  take it as directed.   - Low Vit D is associated with a 200-300% higher risk for  CANCER   and 200-300% higher risk for HEART   ATTACK  &  STROKE.    - It is also associated with higher death rate at younger ages,   autoimmune diseases like Rheumatoid arthritis, Lupus,  Multiple Sclerosis.     - Also many other serious conditions, like depression, Alzheimer's  Dementia, infertility, muscle aches, fatigue, fibromyalgia  <><><><><><><><><><><><><><><><><><><><><><><><><><><><><><><><><> <><><><><><><><><><><><><><><><><><><><><><><><><><><><><><><><><> All Else - CBC - Kidneys - Electrolytes - Liver - Magnesium & Thyroid    - all  Normal / OK <><><><><><><><><><><><><><><><><><><><><><><><><><><><><><><><><> <><><><><><><><><><><><><><><><><><><><><><><><><><><><><><><><><>

## 2021-11-30 ENCOUNTER — Encounter: Payer: Self-pay | Admitting: Cardiovascular Disease

## 2021-11-30 ENCOUNTER — Ambulatory Visit (INDEPENDENT_AMBULATORY_CARE_PROVIDER_SITE_OTHER): Payer: Medicare Other | Admitting: Cardiovascular Disease

## 2021-11-30 VITALS — BP 160/68 | HR 68 | Ht 64.5 in | Wt 149.4 lb

## 2021-11-30 DIAGNOSIS — D6869 Other thrombophilia: Secondary | ICD-10-CM

## 2021-11-30 DIAGNOSIS — I5032 Chronic diastolic (congestive) heart failure: Secondary | ICD-10-CM

## 2021-11-30 DIAGNOSIS — I4821 Permanent atrial fibrillation: Secondary | ICD-10-CM | POA: Diagnosis not present

## 2021-11-30 DIAGNOSIS — E782 Mixed hyperlipidemia: Secondary | ICD-10-CM

## 2021-11-30 DIAGNOSIS — Z950 Presence of cardiac pacemaker: Secondary | ICD-10-CM

## 2021-11-30 DIAGNOSIS — I10 Essential (primary) hypertension: Secondary | ICD-10-CM | POA: Diagnosis not present

## 2021-11-30 MED ORDER — DILTIAZEM HCL ER COATED BEADS 300 MG PO CP24: 300.0000 mg | ORAL_CAPSULE | Freq: Every day | ORAL | 3 refills | Status: DC

## 2021-11-30 NOTE — Progress Notes (Signed)
[name], female   DOB: Jul 01, 1927, 88 y.o.   MRN: [id number]    Cardiology Office Note    Date:  11/30/2021   [name], Misty Blackwell 1928-01-08, MRN [id number]  PCP:  [name], MD  Cardiologist:   Sanda Klein, MD   Chief Complaint  Patient presents with   Pacemaker Check    History of Present Illness:  Misty Blackwell is a 88 y.o. female who presents for pacemaker check, 3 months after generator change out.  She has a Medtronic generator, but the ventricular lead is a old 31 lead from 2011 so the system is not MRI conditional.  She has a history of permanent atrial fibrillation with slow ventricular response and pacemaker (Medtronic), remote TIA, HTN, minor CAD, asymptomatic pacemaker-detected NSVT, history of diastolic HF.  An echocardiogram performed while she was hospitalized for heart failure exacerbation 2022 showed normal left ventricular systolic function and was interpreted as showing pseudo normal filling (diastolic function cannot be evaluated in this patient in atrial fibrillation).  No major valve problems were identified.  Overall she is feeling  that she is doing much better.  She continues to live independently and take care of her own household chores.  She is frequently bothered by vertigo which is triggered by turning her head sideways too fast.  In the past this also associated nausea, but this has not been a problem recently.  She has not had any syncope and thankfully has not had falls since her last appointment.  The pacemaker site has healed very nicely.  There is no evidence of any inflammatory changes or swelling at the site.  Device function is normal with estimated generator longevity of 13.2 years.  Lead parameters are all in nominal range and unchanged from the previous visit.  She has 82% ventricular pacing, she is not pacemaker dependent.  The heart rate histogram distribution appears appropriate.  She had a single 6  beat run of high ventricular rates that could be nonsustained ventricular tachycardia.  Her weight is 149 pounds, very close to her usual range of around 145-146 pounds.  She has not had recent problems with edema, orthopnea or PND and denies exertional dyspnea.  As before her systolic blood pressure remains high she says its been so at every single office visit and whenever she checks it at home.  It is occasionally in the 140s but typically in the 150s.   In 2016 she underwent echocardiography and nuclear stress testing.  Left ventricular systolic function was normal, she had a fixed moderate apical defect but without reversible ischemia.  Moderate tricuspid regurgitation and moderate pulmonary artery hypertension with estimated systolic PA pressure of 58 mmHg.  In April 2019 she had a nuclear stress test showing normal perfusion and an echocardiogram which showed unchanged findings of mild LVH, normal left ventricular systolic function, mild left atrial dilation, improved degree of pulmonary hypertension 41 mmHg.   Past Medical History:  Diagnosis Date   Anxiety    Atrial fib/flutter, transient    Atrial fibrillation, chronic (HCC)    CHF (congestive heart failure) ([place]) 10/29/2009   Echo - EF >55%; normal LV size and systolic function; unable to assess diastolic fcn due to E/A fusion, pulmonary vein flow pattern suggests elevated filling pressure; marked biatrail dilation, mild/mod tricuspid regurgitation; mod pulmonary htn; mild/mod mitral regurgitation; although echocardiographic features are incomplete findings suggest possible infiltrative cardiomyopathy (maybe amyloidosi   Coronary artery disease 03/19/2002   R/P  Cardiolite - EF 76%; nromal static and dynamic myocardial perfusion images; normal wall motion and endocardial thickening in all vascular territories   Dyspnea    Dysrhythmia    Facial numbness 12/26/2008   carotid doppler - R and L ICAs 0-49% diameter reduction (velocities  suggest low end of scale)   Hypertension    Pacemaker    Peripheral neuropathy    Pneumonia 2017   Skin cancer    s/p surgical removal.   Stroke ([place]) 04/2017   TIA (transient ischemic attack)     Past Surgical History:  Procedure Laterality Date   ABDOMINAL HYSTERECTOMY     APPENDECTOMY     BREAST LUMPECTOMY WITH RADIOACTIVE SEED LOCALIZATION Right 12/13/2019   Procedure: RIGHT BREAST LUMPECTOMY WITH RADIOACTIVE SEED LOCALIZATION;  Surgeon: Donnie Mesa, MD;  Location: [facility];  Service: General;  Laterality: Right;  LMA VS MAC   BREAST SURGERY Left [place]  08/06/2005   minimal coronary disease predominant RCA; no significant atherosclerosis; new onset sick sinus syndrome and atrial flutter w/ ventricular response, controlled on med therapy; systemic HTN, normal renal arteries   CARDIOVERSION  11/19/2009   successful DCCV from AF to sinus type rhythm   CHOLECYSTECTOMY     EYE SURGERY Bilateral 2013   PPM GENERATOR CHANGEOUT N/A 08/24/2021   Procedure: [place];  Surgeon: Sanda Klein, MD;  Location: [facility];  Service: Cardiovascular;  Laterality: N/A;   Skin cancer resection     TONSILLECTOMY  1938    Outpatient Medications Prior to Visit  Medication Sig Dispense Refill   acidophilus (RISAQUAD) CAPS capsule Take 1 capsule by mouth daily.     anastrozole (ARIMIDEX) 1 MG tablet TAKE 1 TABLET(1 MG) BY MOUTH DAILY (Patient taking differently: Take 1 mg by mouth daily.) 90 tablet 3   Ascorbic Acid (VITAMIN C) 1000 MG tablet Take 1,000 mg by mouth daily.      Cholecalciferol (VITAMIN D-3) 125 MCG (5000 UT) TABS Take 5,000 Units by mouth daily.     Cyanocobalamin (VITAMIN B 12 PO) Take 1,000 mcg by mouth daily.      Ferrous Sulfate 90 (18 Fe) MG TABS Take 18 mg by mouth daily with breakfast.     furosemide (LASIX) 20 MG tablet Take 2 tablets (40 mg total) by mouth daily. (Patient taking differently: Take 20 mg by mouth in the morning and  at bedtime.) 30 tablet 11   hydrOXYzine (ATARAX) 50 MG tablet TAKE 1 TO 2 TABLETS BY MOUTH EVERY NIGHT 1 HOUR BEFORE BEDTIME AS NEEDED FOR SLEEP (Patient taking differently: Take 100 mg by mouth at bedtime.) 60 tablet 2   irbesartan (AVAPRO) 300 MG tablet TAKE 1 TABLET(300 MG) BY MOUTH DAILY (Patient taking differently: Take 300 mg by mouth daily.) 90 tablet 0   Magnesium 250 MG TABS Take 250 mg by mouth daily.     OVER THE COUNTER MEDICATION Apply 1 application. topically See admin instructions. Neuropathy maximum strength Nerve relief and recovery cream- Apply to the feet at bedtime     potassium chloride SA (KLOR-CON M) 20 MEQ tablet Take  1 tablet  2 x /day  for Potassium (Patient taking differently: Take 20 mEq by mouth in the morning and at bedtime.) 180 tablet 1   warfarin (COUMADIN) 3 MG tablet TAKE 1 TO 1 AND 1/2 TABLET BY MOUTH AS DIRECTED 135 tablet 1   zinc gluconate 50 MG tablet Take 50 mg by mouth daily.  diltiazem (CARDIZEM CD) 240 MG 24 hr capsule Take 1 capsule (240 mg total) by mouth daily. 30 capsule 6   acetaminophen (TYLENOL) 325 MG tablet Take 325-650 mg by mouth every 6 (six) hours as needed for mild pain or headache. (Patient not taking: Reported on 11/30/2021)     clotrimazole-betamethasone (LOTRISONE) cream APPLY TOPICALLY TO THE AFFECTED AREA TWICE DAILY (Patient not taking: Reported on 11/30/2021) 45 g 3   No facility-administered medications prior to visit.     Allergies:   Latex, Ace inhibitors, Augmentin [amoxicillin-pot clavulanate], Ciprofloxacin, Levaquin [levofloxacin in d5w], Zocor [simvastatin], Acrylic polymer [carbomer], Chocolate, and Gabapentin   Social History   Socioeconomic History   Marital status: Widowed    Spouse name: Not on file   Number of children: 0   Years of education: Not on file   Highest education level: Not on file  Occupational History   Not on file  Tobacco Use   Smoking status: Never   Smokeless tobacco: Never  Vaping Use    Vaping Use: Never used  Substance and Sexual Activity   Alcohol use: No   Drug use: No   Sexual activity: Never    Birth control/protection: None  Other Topics Concern   Not on file  Social History Narrative   Widowed.  Lives alone.  Ambulates independently.   Social Determinants of Health   Financial Resource Strain: Not on file  Food Insecurity: Not on file  Transportation Needs: Not on file  Physical Activity: Not on file  Stress: Not on file  Social Connections: Not on file     Family History:  The patient's family history includes Cirrhosis in her brother; Diabetes in her mother; Heart attack in her father; Heart disease in her father and mother.   ROS:   Please see the history of present illness.    ROS All other systems are reviewed and are negative.   PHYSICAL EXAM:   VS:  BP (!) 160/68   Pulse 68   Ht 5' 4.5" (1.638 m)   Wt 149 lb 6.4 oz (67.8 kg)   SpO2 93%   BMI 25.25 kg/m       General: Alert, oriented x3, no distress, appears younger than stated age.  The left subclavian pacemaker site is well-healed without any redness/swelling/warmth or drainage. Head: no evidence of trauma, PERRL, EOMI, no exophtalmos or lid lag, no myxedema, no xanthelasma; normal ears, nose and oropharynx Neck: normal jugular venous pulsations and no hepatojugular reflux; brisk carotid pulses without delay and no carotid bruits Chest: clear to auscultation, no signs of consolidation by percussion or palpation, normal fremitus, symmetrical and full respiratory excursions Cardiovascular: normal position and quality of the apical impulse, regular rhythm, normal first and second heart sounds, no murmurs, rubs or gallops Abdomen: no tenderness or distention, no masses by palpation, no abnormal pulsatility or arterial bruits, normal bowel sounds, no hepatosplenomegaly Extremities: no clubbing, cyanosis or edema; 2+ radial, ulnar and brachial pulses bilaterally; 2+ right femoral, posterior  tibial and dorsalis pedis pulses; 2+ left femoral, posterior tibial and dorsalis pedis pulses; no subclavian or femoral bruits Neurological: grossly nonfocal Psych: Normal mood and affect   Wt Readings from Last 3 Encounters:  11/30/21 149 lb 6.4 oz (67.8 kg)  11/23/21 149 lb (67.6 kg)  10/28/21 145 lb 3.2 oz (65.9 kg)      Studies/Labs Reviewed:   EKG:  EKG is not ordered today.  ECG from 05/12/2021 is personally reviewed and shows background atrial  fibrillation with 100% ventricular paced rhythm.  Intracardiac electrogram shows ventricular fibrillation with controlled ventricular response and mostly ventricular paced beats.  05/18/2021: BNP 93.7 10/28/2021: Magnesium 2.3 11/23/2021: ALT 10; BUN 18; Creat 0.80; Hemoglobin 13.3; Platelets 261; Potassium 4.5; Sodium 141; TSH 1.36   Lipid Panel    Component Value Date/Time   CHOL 155 11/23/2021 1038   TRIG 204 (H) 11/23/2021 1038   HDL 44 (L) 11/23/2021 1038   CHOLHDL 3.5 11/23/2021 1038   VLDL 34 (H) 09/28/2016 1125   LDLCALC 81 11/23/2021 1038     ASSESSMENT:    1. Chronic diastolic heart failure ([place])   2. Permanent atrial fibrillation ([place])   3. Essential hypertension   4. Acquired thrombophilia ([place])   5. Pacemaker   6. Hyperlipidemia, mixed       PLAN:  In order of problems listed above:  CHF: Appears clinically euvolemic and NYHA functional class I.  Optimal volume status seems to be at a weight of 145-149 pounds. AFib: Unchanged.  Permanent arrhythmia, with slow ventricular response, almost never has RVR and has about 90% ventricular pacing.   History of  previous transient ischemic attack (CHADSVasc 8: age 55, TIA 2, HTN, CAD, CHF, gender).  HTN:   Blood pressure is persistently a little high.  She is on maximum dose irbesartan and takes a low-dose of furosemide.  Would like to avoid additional diuretics due to history of falls and risk of orthostatic hypotension.  We will increase the diltiazem to 300 mg once  daily. Anticoagulation:  She has not had any additional falls and denies any bleeding problems. PPM: Normal device function.  Although the pacemaker generator is MRI conditional, the ventricular lead is not. HLP: She did not tolerate more potent statins.  Although she does have aortic atherosclerosis, she does not have known CAD or PAD.  Current LDL cholesterol of 81 appears acceptable.  HYPERTENSION CONTROL Vitals:   11/30/21 1058 11/30/21 1125  BP: (!) 160/66 (!) 160/68    The patient's blood pressure is elevated above target today.  The following intervention was performed to address the patient's elevated BP:  - A current anti-hypertensive medication was adjusted today.      Medication Adjustments/Labs and Tests Ordered: Current medicines are reviewed at length with the patient today.  Concerns regarding medicines are outlined above.  Medication changes, Labs and Tests ordered today are listed in the Patient Instructions below. Patient Instructions  Medication Instructions:  INCREASE the Diltiazem to 300 mg once daily  *If you need a refill on your cardiac medications before your next appointment, please call your pharmacy*   Lab Work: None ordered If you have labs (blood work) drawn today and your tests are completely normal, you will receive your results only by: [place] (if you have MyChart) OR A paper copy in the mail If you have any lab test that is abnormal or we need to change your treatment, we will call you to review the results.   Testing/Procedures: None ordered   Follow-Up: At [facility], you and your health needs are our priority.  As part of our continuing mission to provide you with exceptional heart care, we have created designated Provider Care Teams.  These Care Teams include your primary Cardiologist (physician) and Advanced Practice Providers (APPs -  Physician Assistants and Nurse Practitioners) who all work together to provide you with the  care you need, when you need it.  We recommend signing up for the patient portal called "  MyChart".  Sign up information is provided on this After Visit Summary.  MyChart is used to connect with patients for Virtual Visits (Telemedicine).  Patients are able to view lab/test results, encounter notes, upcoming appointments, etc.  Non-urgent messages can be sent to your provider as well.   To learn more about what you can do with MyChart, go to [email or web address].    Your next appointment:   12 month(s)  The format for your next appointment:   In Person  Provider:   Sanda Klein, MD     Other Instructions Dr. Sallyanne Kuster would like you to check your blood pressure daily for the next 2 weeks.  Keep a journal of these daily blood pressure and heart rate readings and call our office or send a message through [facility] with the results. Thank you!  It is best to check your BP 1-2 hours after taking your medications to see the medications effectiveness on your BP.    Here are some tips that our clinical pharmacists share for home BP monitoring:          Rest 10 minutes before taking your blood pressure.          Don't smoke or drink caffeinated beverages for at least 30 minutes before.          Take your blood pressure before (not after) you eat.          Sit comfortably with your back supported and both feet on the floor (don't cross your legs).          Elevate your arm to heart level on a table or a desk.          Use the proper sized cuff. It should fit smoothly and snugly around your bare upper arm. There should be enough room to slip a fingertip under the cuff. The bottom edge of the cuff should be 1 inch above the crease of the elbow.   Important Information About Sugar            Signed, Sanda Klein, MD  11/30/2021 6:38 PM    [facility] [address], [place], [place]  [address] Phone: [phone]; Fax: [phone]

## 2021-11-30 NOTE — Patient Instructions (Signed)
Medication Instructions:  INCREASE the Diltiazem to 300 mg once daily  *If you need a refill on your cardiac medications before your next appointment, please call your pharmacy*   Lab Work: None ordered If you have labs (blood work) drawn today and your tests are completely normal, you will receive your results only by: [place] (if you have MyChart) OR A paper copy in the mail If you have any lab test that is abnormal or we need to change your treatment, we will call you to review the results.   Testing/Procedures: None ordered   Follow-Up: At [facility], you and your health needs are our priority.  As part of our continuing mission to provide you with exceptional heart care, we have created designated Provider Care Teams.  These Care Teams include your primary Cardiologist (physician) and Advanced Practice Providers (APPs -  Physician Assistants and Nurse Practitioners) who all work together to provide you with the care you need, when you need it.  We recommend signing up for the patient portal called "MyChart".  Sign up information is provided on this After Visit Summary.  MyChart is used to connect with patients for Virtual Visits (Telemedicine).  Patients are able to view lab/test results, encounter notes, upcoming appointments, etc.  Non-urgent messages can be sent to your provider as well.   To learn more about what you can do with MyChart, go to [email or web address].    Your next appointment:   12 month(s)  The format for your next appointment:   In Person  Provider:   Sanda Klein, MD     Other Instructions Dr. Sallyanne Kuster would like you to check your blood pressure daily for the next 2 weeks.  Keep a journal of these daily blood pressure and heart rate readings and call our office or send a message through [facility] with the results. Thank you!  It is best to check your BP 1-2 hours after taking your medications to see the medications effectiveness on your  BP.    Here are some tips that our clinical pharmacists share for home BP monitoring:          Rest 10 minutes before taking your blood pressure.          Don't smoke or drink caffeinated beverages for at least 30 minutes before.          Take your blood pressure before (not after) you eat.          Sit comfortably with your back supported and both feet on the floor (don't cross your legs).          Elevate your arm to heart level on a table or a desk.          Use the proper sized cuff. It should fit smoothly and snugly around your bare upper arm. There should be enough room to slip a fingertip under the cuff. The bottom edge of the cuff should be 1 inch above the crease of the elbow.   Important Information About Sugar

## 2021-12-02 ENCOUNTER — Encounter: Payer: Self-pay | Admitting: Internal Medicine

## 2021-12-07 ENCOUNTER — Ambulatory Visit (INDEPENDENT_AMBULATORY_CARE_PROVIDER_SITE_OTHER): Payer: Medicare Other | Admitting: *Deleted

## 2021-12-07 DIAGNOSIS — Z7901 Long term (current) use of anticoagulants: Secondary | ICD-10-CM | POA: Diagnosis not present

## 2021-12-07 DIAGNOSIS — I482 Chronic atrial fibrillation, unspecified: Secondary | ICD-10-CM

## 2021-12-07 LAB — POCT INR: INR: 2.4 (ref 2.0–3.0)

## 2021-12-07 NOTE — Patient Instructions (Signed)
Description   Continue taking 1.5 tablets daily except 1 tablet Sunday, Tuesday and Thursday.  Repeat INR in 5 weeks. Call [phone] with any questions

## 2021-12-09 ENCOUNTER — Other Ambulatory Visit: Payer: Self-pay | Admitting: Hematology and Oncology

## 2021-12-09 ENCOUNTER — Ambulatory Visit
Admission: RE | Admit: 2021-12-09 | Discharge: 2021-12-09 | Disposition: A | Discharge status: Home / Self Care | Payer: Medicare Other | Source: Ambulatory Visit | Attending: Hematology and Oncology | Admitting: Hematology and Oncology

## 2021-12-09 DIAGNOSIS — N6310 Unspecified lump in the right breast, unspecified quadrant: Secondary | ICD-10-CM

## 2021-12-09 DIAGNOSIS — Z9889 Other specified postprocedural states: Secondary | ICD-10-CM

## 2021-12-15 ENCOUNTER — Encounter: Payer: Self-pay | Admitting: Cardiovascular Disease

## 2021-12-21 NOTE — Progress Notes (Signed)
Remote pacemaker transmission.   

## 2021-12-31 ENCOUNTER — Other Ambulatory Visit: Payer: Self-pay | Admitting: Internal Medicine

## 2021-12-31 DIAGNOSIS — I10 Essential (primary) hypertension: Secondary | ICD-10-CM

## 2021-12-31 MED ORDER — IRBESARTAN 300 MG PO TABS: ORAL_TABLET | ORAL | 3 refills | Status: DC

## 2022-01-11 ENCOUNTER — Ambulatory Visit: Payer: Medicare Other | Attending: Cardiovascular Disease

## 2022-01-11 DIAGNOSIS — Z7901 Long term (current) use of anticoagulants: Secondary | ICD-10-CM

## 2022-01-11 DIAGNOSIS — I482 Chronic atrial fibrillation, unspecified: Secondary | ICD-10-CM | POA: Diagnosis not present

## 2022-01-11 LAB — POCT INR: INR: 3.7 — AB (ref 2.0–3.0)

## 2022-01-11 NOTE — Patient Instructions (Signed)
HOLD TODAY ONLY and then Continue taking 1.5 tablets daily except 1 tablet Sunday, Tuesday and Thursday.  Repeat INR in 3 weeks. Call [phone] with any questions

## 2022-01-21 LAB — HM DIABETES EYE EXAM

## 2022-01-26 ENCOUNTER — Ambulatory Visit (INDEPENDENT_AMBULATORY_CARE_PROVIDER_SITE_OTHER): Payer: Medicare Other | Admitting: Nurse Practitioner

## 2022-01-26 ENCOUNTER — Encounter: Payer: Self-pay | Admitting: Nurse Practitioner

## 2022-01-26 VITALS — BP 138/70 | HR 79 | Temp 97.9°F | Ht 64.5 in | Wt 150.0 lb

## 2022-01-26 DIAGNOSIS — G4700 Insomnia, unspecified: Secondary | ICD-10-CM

## 2022-01-26 DIAGNOSIS — F419 Anxiety disorder, unspecified: Secondary | ICD-10-CM

## 2022-01-26 DIAGNOSIS — Z7901 Long term (current) use of anticoagulants: Secondary | ICD-10-CM

## 2022-01-26 DIAGNOSIS — Z950 Presence of cardiac pacemaker: Secondary | ICD-10-CM | POA: Diagnosis not present

## 2022-01-26 DIAGNOSIS — R6889 Other general symptoms and signs: Secondary | ICD-10-CM | POA: Diagnosis not present

## 2022-01-26 DIAGNOSIS — I5032 Chronic diastolic (congestive) heart failure: Secondary | ICD-10-CM

## 2022-01-26 DIAGNOSIS — I495 Sick sinus syndrome: Secondary | ICD-10-CM

## 2022-01-26 DIAGNOSIS — I2510 Atherosclerotic heart disease of native coronary artery without angina pectoris: Secondary | ICD-10-CM | POA: Diagnosis not present

## 2022-01-26 DIAGNOSIS — C50911 Malignant neoplasm of unspecified site of right female breast: Secondary | ICD-10-CM

## 2022-01-26 DIAGNOSIS — I482 Chronic atrial fibrillation, unspecified: Secondary | ICD-10-CM

## 2022-01-26 DIAGNOSIS — R7309 Other abnormal glucose: Secondary | ICD-10-CM

## 2022-01-26 DIAGNOSIS — Z0000 Encounter for general adult medical examination without abnormal findings: Secondary | ICD-10-CM

## 2022-01-26 DIAGNOSIS — M4802 Spinal stenosis, cervical region: Secondary | ICD-10-CM

## 2022-01-26 DIAGNOSIS — I10 Essential (primary) hypertension: Secondary | ICD-10-CM

## 2022-01-26 DIAGNOSIS — E559 Vitamin D deficiency, unspecified: Secondary | ICD-10-CM

## 2022-01-26 DIAGNOSIS — Z0001 Encounter for general adult medical examination with abnormal findings: Secondary | ICD-10-CM

## 2022-01-26 DIAGNOSIS — M1990 Unspecified osteoarthritis, unspecified site: Secondary | ICD-10-CM

## 2022-01-26 DIAGNOSIS — Z8673 Personal history of transient ischemic attack (TIA), and cerebral infarction without residual deficits: Secondary | ICD-10-CM

## 2022-01-26 DIAGNOSIS — F3341 Major depressive disorder, recurrent, in partial remission: Secondary | ICD-10-CM

## 2022-01-26 DIAGNOSIS — Z79899 Other long term (current) drug therapy: Secondary | ICD-10-CM

## 2022-01-26 DIAGNOSIS — E782 Mixed hyperlipidemia: Secondary | ICD-10-CM

## 2022-01-26 DIAGNOSIS — K219 Gastro-esophageal reflux disease without esophagitis: Secondary | ICD-10-CM

## 2022-01-26 DIAGNOSIS — R2681 Unsteadiness on feet: Secondary | ICD-10-CM

## 2022-01-26 NOTE — Progress Notes (Signed)
Marland Kitchen MEDICARE ANNUAL WELLNESS VISIT AND FOLLOW UP  Assessment:   Encounter for Medicare annual wellness exam Due annually  Essential hypertension Continue Amlodipine, Diltiazem, Furosemide, Irbesartan Follows with Cardiology, Dr. Sallyanne Kuster Discussed DASH (Dietary Approaches to Stop Hypertension) DASH diet is lower in sodium than a typical American diet. Cut back on foods that are high in saturated fat, cholesterol, and trans fats. Eat more whole-grain foods, fish, poultry, and nuts Remain active and exercise as tolerated daily.  Monitor BP at home-Call if greater than 130/80.  Check CMP/CBC   Atherosclerosis of native coronary artery of native heart without angina pectoris Discussed lifestyle modifications. Recommended diet heavy in fruits and veggies, omega 3's. Decrease consumption of animal meats, cheeses, and dairy products. Remain active and exercise as tolerated. Continue to monitor. Check lipids/TSH   Chronic diastolic heart failure (HCC) Continue furosemide Monitor BLE edema; elevated feet, compression stockings Limit salt intake Cardiology following  SSS (sick sinus syndrome) (HCC)/Pacemaker Cardiology following Continue to monitor  Chronic atrial fibrillation (HCC)/Long term anticoagulatn Continue Coumadin Monitor PT/INR  Gastroesophageal reflux disease without esophagitis No suspected reflux complications ([place]/stricture). Lifestyle modification:  wt loss, avoid meals 2-3h before bedtime. Consider eliminating food triggers:  chocolate, caffeine, EtOH, acid/spicy food.  Osteoarthritis/spinal stenosis Pursue a combination of weight-bearing exercises and strength training. Advised on fall prevention measures including proper lighting in all rooms, removal of area rugs and floor clutter, use of walking devices as deemed appropriate, avoidance of uneven walking surfaces. Consume 800 to 1000 IU of vitamin D daily with a goal vitamin D serum value of 30 ng/mL or  higher. Aim for 1000 to 1200 mg of elemental calcium daily through supplements and/or dietary sources.  Hyperlipidemia, mixed Discussed lifestyle modifications. Recommended diet heavy in fruits and veggies, omega 3's. Decrease consumption of animal meats, cheeses, and dairy products. Remain active and exercise as tolerated. Continue to monitor. Check lipids/TSH  Vitamin D deficiency Continue supplement Check and monitor levels  History of TIA (transient ischemic attack) Continue to monitor  Abnormal glucose Education: Reviewed 'ABCs' of diabetes management  Discussed goals to be met and/or maintained include A1C (<7) Blood pressure (<130/80) Cholesterol (LDL <70) Continue Eye Exam yearly  Continue Dental Exam Q6 mo Discussed dietary recommendations Discussed Physical Activity recommendations Check A1C  Anxiety/Depression Reviewed relaxation techniques.  Sleep hygiene. Recommended mindfulness meditation and exercise.   Psychoeducation:  encouraged personality growth wand development through coping techniques and problem-solving skills.  Insomnia, unspecified type Discussed good sleep hygiene. Establish bed and wake times. Sleep restriction-only sleep estimated hrs sleep. Bed only for sex and sleep, only sleep when sleepy, out of bed if anxious (stimulus control). Reviewed relaxation techniques, mindful meditations. Expected sleep duration. Addressed worries about not sleeping.   Invasive ductal carcinoma of breast, female, right [facility]) Cancer staging 11/15/2019 Right lumpectomy 12/13/2019 Follows with Oncology, Dr. Lindi Adie  Unstable gait Continue with walker. Wear good fitting shoes Monitor surroundings to help prevent falls.  Medication management All medications discussed and reviewed in full. All questions and concerns regarding medications addressed.    Orders Placed This Encounter  Procedures   Protime-INR   CBC with Differential/Platelet   COMPLETE  METABOLIC PANEL WITH GFR   Magnesium   Lipid panel   VITAMIN D 25 Hydroxy (Vit-D Deficiency, Fractures)    Over 40 minutes of exam, counseling, chart review and critical decision making was performed Future Appointments  Date Time Provider [place]  02/01/2022 10:00 AM [facility] None  02/22/2022  8:15  AM [name], MD [facility] None  02/23/2022  7:05 AM [facility] DEVICE REMOTES [facility]  04/29/2022  3:00 PM [name], MD [facility] None  05/25/2022  7:05 AM [facility] DEVICE REMOTES [facility]  06/28/2022  9:30 AM [name], MD [facility] None  08/20/2022  9:00 AM [name], MD [facility] None  08/24/2022  7:05 AM [facility] DEVICE REMOTES [facility]  11/23/2022  7:05 AM [facility] DEVICE REMOTES [facility]     Plan:   During the course of the visit the patient was educated and counseled about appropriate screening and preventive services including:   Pneumococcal vaccine  Prevnar 13 Influenza vaccine Td vaccine Screening electrocardiogram Bone densitometry screening Colorectal cancer screening Diabetes screening Glaucoma screening Nutrition counseling  Advanced directives: requested   Subjective:  Misty Blackwell is a 88 y.o. female who presents for Medicare Annual Wellness Visit and 1 month follow up. She has Hyperlipidemia, mixed; Essential hypertension; Coronary atherosclerosis by Chest CT on 10/02/2021; GERD; FIBROCYSTIC BREAST DISEASE; Osteoarthritis; Long term current use of anticoagulant therapy; Pacemaker; Vitamin D deficiency; Chronic diastolic heart failure ([place]); History of TIA (transient ischemic attack); Chronic atrial fibrillation ([place]); Neural foraminal stenosis of cervical spine; Abnormal glucose; SSS (sick sinus syndrome) ([place]); Anxiety; Insomnia; Recurrent major 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 ([place]); Malignant neoplasm of upper-outer  quadrant of right breast in female, estrogen receptor positive ([place]); Invasive ductal carcinoma of breast, female, right ([place]); Aortic atherosclerosis ([place]) by Chest CT 0n 10/02/2021; Pacemaker battery depletion; Longstanding persistent atrial fibrillation ([place]); Syncope; Scalp hematoma; Forehead laceration; Back pain; At high risk for injury related to fall; and Unstable gait on their problem list.  Overall she reports doing well today.  She continues to live independently and take care of her own househould chores.She has two friends around her who help her out.    She had a syncopal episode with a fall and visit to the ED 10/02/21 which is believed to be due to pacemaker malfunction, low battery.  She was noted to have 3 laceration on the left side of forehead with active bleeding and multiple scattered bruises.  She has a hx of afib, and SSS.  She has a pacemaker.  She is on coumadin.  She has chronic diastolic heart failure.  She follows with Dr. Sallyanne Kuster, Cardiology.  Last seen 11/30/21.  She had a pacemaker check, 3 months after generateor change out.  She has a Publishing rights manager, but ventricular lead is old 20 lead from 2011 so system is not MRI conditional.  Pacemaker was checked an noted that device function is normal with estimated generator longevity of 13.2 years.  Lead parameters are all in nominal range and unchanged from the previous visit.  She has 82% ventricular pacing, she is not pacemaker dependent.  The heart rate histogram distribution appears appropriate.  She had a single 6 beat run of high ventricular rates that could be nonsustained ventricular tachycardia.  An echocardiogram performed while she was hospitalized for heart failure exacerbation 2022 showed normal left ventricular systolic function and was interpreted as showing pseudo normal filling (diastolic function cannot be evaluated in this patient in atrial fibrillation).  No major valve problems were identified.  BMI is  Body mass index is 25.35 kg/m., she has not been working on diet and exercise. Wt Readings from Last 3 Encounters:  01/26/22 150 lb (68 kg)  11/30/21 149 lb 6.4 oz (67.8 kg)  11/23/21 149 lb (67.6 kg)    Her blood pressure has been controlled at  home, today their BP is BP: 138/70 She does not workout. She denies chest pain, shortness of breath, dizziness.  She is not on cholesterol medication and denies myalgias. Her cholesterol is not at goal with elevated triglycerides. The cholesterol last visit was:   Lab Results  Component Value Date   CHOL 155 11/23/2021   HDL 44 (L) 11/23/2021   LDLCALC 81 11/23/2021   TRIG 204 (H) 11/23/2021   CHOLHDL 3.5 11/23/2021   She has not been working on diet and exercise for treatmet of prediabetes, and denies polydipsia and polyuria. Last A1C in the office was:  Lab Results  Component Value Date   HGBA1C 5.4 11/23/2021   Last GFR: Lab Results  Component Value Date   EGFR 69 11/23/2021   Patient is on Vitamin D supplement.   Lab Results  Component Value Date   VD25OH 54 11/23/2021      Medication Review: Current Outpatient Medications on File Prior to Visit  Medication Sig Dispense Refill   acidophilus (RISAQUAD) CAPS capsule Take 1 capsule by mouth daily.     anastrozole (ARIMIDEX) 1 MG tablet TAKE 1 TABLET(1 MG) BY MOUTH DAILY (Patient taking differently: Take 1 mg by mouth daily.) 90 tablet 3   Ascorbic Acid (VITAMIN C) 1000 MG tablet Take 1,000 mg by mouth daily.      Cholecalciferol (VITAMIN D-3) 125 MCG (5000 UT) TABS Take 5,000 Units by mouth daily.     Cyanocobalamin (VITAMIN B 12 PO) Take 1,000 mcg by mouth daily.      diltiazem (CARDIZEM CD) 300 MG 24 hr capsule Take 1 capsule (300 mg total) by mouth daily. 90 capsule 3   Ferrous Sulfate 90 (18 Fe) MG TABS Take 18 mg by mouth daily with breakfast.     furosemide (LASIX) 20 MG tablet Take 2 tablets (40 mg total) by mouth daily. (Patient taking differently: Take 20 mg by mouth in  the morning and at bedtime.) 30 tablet 11   hydrOXYzine (ATARAX) 50 MG tablet TAKE 1 TO 2 TABLETS BY MOUTH EVERY NIGHT 1 HOUR BEFORE BEDTIME AS NEEDED FOR SLEEP (Patient taking differently: Take 100 mg by mouth at bedtime.) 60 tablet 2   irbesartan (AVAPRO) 300 MG tablet Take  1 tablet  Daily  for BP & Heart 90 tablet 3   Magnesium 250 MG TABS Take 250 mg by mouth daily.     OVER THE COUNTER MEDICATION Apply 1 application. topically See admin instructions. Neuropathy maximum strength Nerve relief and recovery cream- Apply to the feet at bedtime     potassium chloride SA (KLOR-CON M) 20 MEQ tablet Take  1 tablet  2 x /day  for Potassium (Patient taking differently: Take 20 mEq by mouth in the morning and at bedtime.) 180 tablet 1   warfarin (COUMADIN) 3 MG tablet TAKE 1 TO 1 AND 1/2 TABLET BY MOUTH AS DIRECTED 135 tablet 1   zinc gluconate 50 MG tablet Take 50 mg by mouth daily.     No current facility-administered medications on file prior to visit.    Allergies  Allergen Reactions   Latex Itching   Ace Inhibitors Other (See Comments)    Unknown reaction   Augmentin [Amoxicillin-Pot Clavulanate] Other (See Comments)    Unknown   Ciprofloxacin Other (See Comments)    Unknown    Levaquin [Levofloxacin In D5w] Other (See Comments)    Unknown   Zocor [Simvastatin] Other (See Comments)    Unknown    Acrylic Polymer [  Carbomer] Itching   Chocolate Other (See Comments)    Migraines    Gabapentin Other (See Comments)    Unsteady gait     Current Problems (verified) Patient Active Problem List   Diagnosis Date Noted   At high risk for injury related to fall 11/23/2021   Unstable gait 11/23/2021   Scalp hematoma 10/05/2021   Forehead laceration 10/05/2021   Back pain 10/05/2021   Syncope 10/02/2021   Pacemaker battery depletion 08/24/2021   Longstanding persistent atrial fibrillation ([place])    Aortic atherosclerosis ([place]) by Chest CT 0n 10/02/2021 01/12/2021   Invasive ductal  carcinoma of breast, female, right ([place]) 12/13/2019   Malignant neoplasm of upper-outer quadrant of right breast in female, estrogen receptor positive ([place]) 11/21/2019   Anxiety 06/18/2019   Insomnia 06/18/2019   Recurrent major 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 ([place]) 06/18/2019   SSS (sick sinus syndrome) ([place]) 09/26/2018   Abnormal glucose 08/29/2018   Neural foraminal stenosis of cervical spine 04/18/2018   History of TIA (transient ischemic attack) 04/17/2018   Chronic atrial fibrillation (HCC)    Chronic diastolic heart failure ([place]) 11/10/2017   Vitamin D deficiency 08/15/2013   Pacemaker 09/19/2012   Long term current use of anticoagulant therapy 07/18/2012   Hyperlipidemia, mixed 09/03/2008   Essential hypertension 09/03/2008   Coronary atherosclerosis by Chest CT on 10/02/2021 09/03/2008   GERD 09/03/2008   FIBROCYSTIC BREAST DISEASE 09/03/2008   Osteoarthritis 09/03/2008    Screening Tests Immunization History  Administered Date(s) Administered   DT (Pediatric) 03/19/2015   Influenza Split 05/04/2011   Influenza, High Dose Seasonal PF 03/19/2014, 12/23/2015, 02/03/2017, 12/26/2018, 01/28/2020   Influenza,inj,quad, With Preservative 05/21/2013   Influenza-Unspecified 02/04/2015, 02/03/2017, 01/28/2020   PFIZER(Purple Top)SARS-COV-2 Vaccination 06/16/2019, 07/09/2019, 03/21/2020   Pneumococcal Conjugate-13 03/19/2014   Pneumococcal Polysaccharide-23 05/04/2011   Pneumococcal-Unspecified 05/03/2001   Td 05/04/2003   Tdap 10/02/2021   Health Maintenance  Topic Date Due   Zoster Vaccines- Shingrix (1 of 2) Never done   DEXA SCAN  Never done   COVID-19 Vaccine (4 - Pfizer risk series) 05/16/2020   INFLUENZA VACCINE  12/01/2021   TETANUS/TDAP  10/03/2031   Pneumonia Vaccine 32+ Years old  Completed   HPV VACCINES  Aged Out    Last colonoscopy: 09/10/2008 aged out Mammograms: 12/09/2021 yearly check ups Last pap smear/pelvic exam: aged out. DEXA: defers   Names of  Other Physician/Practitioners you currently use: 1. [facility] here for primary care 2. Eye doctor, last visit Sees yearly - just received new Rx 3. Dentist, last visit Follows Q6 mo  Patient Care Team: [name], MD as PCP - General (Internal Medicine) [name], [name], MD as PCP - Cardiology (Cardiology) [name], MD (Inactive) as Consulting Physician (Urology) Penni Bombard, MD as Consulting Physician (Neurology) [name], MD as Consulting Physician (Neurology) [name], [name], MD as Consulting Physician (Cardiology) [name], MD (Inactive) as Consulting Physician (Gastroenterology) [name], RN as Oncology Nurse Navigator [name], RN as Oncology Nurse Navigator  SURGICAL HISTORY She  has a past surgical history that includes Cardioversion (11/19/2009); Cardiac catheterization (08/06/2005); Cholecystectomy; Appendectomy; Abdominal hysterectomy; Skin cancer resection; Tonsillectomy (1938); Breast surgery (Left, 1949); Eye surgery (Bilateral, 2013); Breast lumpectomy with radioactive seed localization (Right, 12/13/2019); PPM GENERATOR CHANGEOUT (N/A, 08/24/2021); Breast biopsy (Right, 11/16/2019); and Breast lumpectomy (Right, 12/13/2019). FAMILY HISTORY Her family history includes Cirrhosis in her brother; Diabetes in her mother; Heart attack in her father; Heart disease in  her father and mother. SOCIAL HISTORY She  reports that she has never smoked. She has never used smokeless tobacco. She reports that she does not drink alcohol and does not use drugs.   MEDICARE WELLNESS OBJECTIVES: Physical activity:   Cardiac risk factors:   Depression/mood screen:      01/26/2022   12:21 PM  Depression screen PHQ 2/9  Decreased Interest 0  Down, Depressed, Hopeless 0  PHQ - 2 Score 0    ADLs:     01/26/2022   12:20 PM 10/02/2021    9:45 PM  In your present state of health, do you have any difficulty  performing the following activities:  Hearing? 0 1  Vision? 0 0  Comment New Rx for corrective lenses   Difficulty concentrating or making decisions? 1 0  Comment At times includes names, lists   Walking or climbing stairs? 0 1  Dressing or bathing? 0 1  Doing errands, shopping? 0 0  Preparing Food and eating ? N   Using the Toilet? N   Do you have problems with loss of bowel control? N   Managing your Medications? N   Managing your Finances? N   Housekeeping or managing your Housekeeping? N      Cognitive Testing  Alert? Yes  Normal Appearance?Yes  Oriented to person? Yes  Place? Yes   Time? Yes  Recall of three objects?  Yes  Can perform simple calculations? Yes  Displays appropriate judgment?Yes  Can read the correct time from a watch face?Yes  EOL planning: Does Patient Have a Medical Advance Directive?: Yes Type of Advance Directive: Living will  Review of Systems  Constitutional:  Negative for chills, fever, malaise/fatigue and weight loss.  HENT:  Negative for congestion, hearing loss, sinus pain, sore throat and tinnitus.   Eyes:  Negative for blurred vision, double vision, discharge and redness.  Respiratory:  Negative for cough, sputum production and shortness of breath.   Cardiovascular:  Negative for chest pain and palpitations.  Gastrointestinal:  Negative for abdominal pain, blood in stool, constipation, diarrhea, heartburn, nausea and vomiting.  Genitourinary:  Negative for dysuria, frequency, hematuria and urgency.  Musculoskeletal:  Negative for falls and joint pain.  Skin:  Negative for itching and rash.  Neurological:  Negative for dizziness, tremors, sensory change, weakness and headaches.  Endo/Heme/Allergies:  Bruises/bleeds easily (anticoagulant).  Psychiatric/Behavioral:  Negative for depression, hallucinations and memory loss. The patient is not nervous/anxious and does not have insomnia.      Objective:     Today's Vitals   01/26/22 1123  BP:  138/70  Pulse: 79  Temp: 97.9 F (36.6 C)  SpO2: 97%  Weight: 150 lb (68 kg)  Height: 5' 4.5" (1.638 m)   Body mass index is 25.35 kg/m.  General appearance: alert, no distress, WD/WN, female HEENT: normocephalic, sclerae anicteric, TMs pearly, nares patent, no discharge or erythema, pharynx normal Oral cavity: MMM, no lesions Neck: supple, no lymphadenopathy, no thyromegaly, no masses Heart: RRR, normal S1, S2, no murmurs Lungs: CTA bilaterally, no wheezes, rhonchi, or rales Abdomen: +bs, soft, non tender, non distended, no masses, no hepatomegaly, no splenomegaly Musculoskeletal: nontender, no swelling, no obvious deformity Extremities: no edema, no cyanosis, no clubbing Pulses: 2+ symmetric, upper and lower extremities, normal cap refill Neurological: alert, oriented x 3, CN2-12 intact, strength normal upper extremities and lower extremities, sensation normal throughout, DTRs 2+ throughout, no cerebellar signs, gait normal Psychiatric: normal affect, behavior normal, pleasant   EKG: 10/2021 Follows  Cardiology   Medicare Attestation I have personally reviewed: The patient's medical and social history Their use of alcohol, tobacco or illicit drugs Their current medications and supplements The patient's functional ability including ADLs,fall risks, home safety risks, cognitive, and hearing and visual impairment Diet and physical activities Evidence for depression or mood disorders  The patient's weight, height, BMI, and visual acuity have been recorded in the chart.  I have made referrals, counseling, and provided education to the patient based on review of the above and I have provided the patient with a written personalized care plan for preventive services.     [name], NP   01/26/2022

## 2022-01-26 NOTE — Patient Instructions (Signed)

## 2022-01-27 LAB — CBC WITH DIFFERENTIAL/PLATELET
Absolute Monocytes: 796 cells/uL (ref 200–950)
Basophils Absolute: 68 cells/uL (ref 0–200)
Basophils Relative: 1 %
Eosinophils Absolute: 61 cells/uL (ref 15–500)
Eosinophils Relative: 0.9 %
HCT: 42.7 % (ref 35.0–45.0)
Hemoglobin: 14.2 g/dL (ref 11.7–15.5)
Lymphs Abs: 2251 cells/uL (ref 850–3900)
MCH: 30.7 pg (ref 27.0–33.0)
MCHC: 33.3 g/dL (ref 32.0–36.0)
MCV: 92.4 fL (ref 80.0–100.0)
MPV: 10.6 fL (ref 7.5–12.5)
Monocytes Relative: 11.7 %
Neutro Abs: 3624 cells/uL (ref 1500–7800)
Neutrophils Relative %: 53.3 %
Platelets: 243 10*3/uL (ref 140–400)
RBC: 4.62 10*6/uL (ref 3.80–5.10)
RDW: 12.7 % (ref 11.0–15.0)
Total Lymphocyte: 33.1 %
WBC: 6.8 10*3/uL (ref 3.8–10.8)

## 2022-01-27 LAB — COMPLETE METABOLIC PANEL WITH GFR
AG Ratio: 1.6 (calc) (ref 1.0–2.5)
ALT: 11 U/L (ref 6–29)
AST: 17 U/L (ref 10–35)
Albumin: 4.6 g/dL (ref 3.6–5.1)
Alkaline phosphatase (APISO): 137 U/L (ref 37–153)
BUN/Creatinine Ratio: 31 (calc) — ABNORMAL HIGH (ref 6–22)
BUN: 28 mg/dL — ABNORMAL HIGH (ref 7–25)
CO2: 28 mmol/L (ref 20–32)
Calcium: 9.7 mg/dL (ref 8.6–10.4)
Chloride: 102 mmol/L (ref 98–110)
Creat: 0.89 mg/dL (ref 0.60–0.95)
Globulin: 2.8 g/dL (calc) (ref 1.9–3.7)
Glucose, Bld: 87 mg/dL (ref 65–99)
Potassium: 4.7 mmol/L (ref 3.5–5.3)
Sodium: 140 mmol/L (ref 135–146)
Total Bilirubin: 0.6 mg/dL (ref 0.2–1.2)
Total Protein: 7.4 g/dL (ref 6.1–8.1)
eGFR: 61 mL/min/{1.73_m2} (ref >=60)

## 2022-01-27 LAB — PROTIME-INR
INR: 2.6 — ABNORMAL HIGH
Prothrombin Time: 26.2 s — ABNORMAL HIGH (ref 9.0–11.5)

## 2022-01-27 LAB — LIPID PANEL
Cholesterol: 177 mg/dL (ref <200)
HDL: 45 mg/dL — ABNORMAL LOW (ref >=50)
LDL Cholesterol (Calc): 98 mg/dL (calc)
Non-HDL Cholesterol (Calc): 132 mg/dL (calc) — ABNORMAL HIGH (ref <130)
Total CHOL/HDL Ratio: 3.9 (calc) (ref <5.0)
Triglycerides: 215 mg/dL — ABNORMAL HIGH (ref <150)

## 2022-01-27 LAB — MAGNESIUM: Magnesium: 2.2 mg/dL (ref 1.5–2.5)

## 2022-01-27 LAB — VITAMIN D 25 HYDROXY (VIT D DEFICIENCY, FRACTURES): Vit D, 25-Hydroxy: 61 ng/mL (ref 30–100)

## 2022-01-30 ENCOUNTER — Other Ambulatory Visit: Payer: Self-pay | Admitting: Internal Medicine

## 2022-01-30 DIAGNOSIS — F5101 Primary insomnia: Secondary | ICD-10-CM

## 2022-01-30 MED ORDER — HYDROXYZINE PAMOATE 100 MG PO CAPS: ORAL_CAPSULE | ORAL | 3 refills | Status: DC

## 2022-02-01 ENCOUNTER — Ambulatory Visit: Payer: Medicare Other

## 2022-02-02 ENCOUNTER — Ambulatory Visit: Payer: Medicare Other | Attending: Cardiovascular Disease

## 2022-02-02 DIAGNOSIS — I482 Chronic atrial fibrillation, unspecified: Secondary | ICD-10-CM

## 2022-02-02 DIAGNOSIS — Z7901 Long term (current) use of anticoagulants: Secondary | ICD-10-CM | POA: Diagnosis not present

## 2022-02-02 LAB — POCT INR: INR: 2.8 (ref 2.0–3.0)

## 2022-02-02 NOTE — Patient Instructions (Signed)
Continue taking 1.5 tablets daily except 1 tablet Sunday, Tuesday and Thursday.  Repeat INR in 4 weeks. Call [phone] with any questions

## 2022-02-03 ENCOUNTER — Other Ambulatory Visit: Payer: Self-pay

## 2022-02-03 ENCOUNTER — Encounter: Payer: Self-pay | Admitting: Internal Medicine

## 2022-02-03 DIAGNOSIS — F5101 Primary insomnia: Secondary | ICD-10-CM

## 2022-02-03 MED ORDER — HYDROXYZINE PAMOATE 100 MG PO CAPS: ORAL_CAPSULE | ORAL | 3 refills | Status: DC

## 2022-02-07 IMAGING — MG DIGITAL DIAGNOSTIC BILAT W/ TOMO W/ CAD
6 of 12 series · 6 of 36 positions shown · non-contrast
Comparison: None.

CLINICAL DATA: Patient has a palpable lump in the upper slightly
inner aspect of the right breast.

EXAM:
DIGITAL DIAGNOSTIC BILATERAL MAMMOGRAM WITH CAD AND TOMO
ULTRASOUND BILATERAL BREAST

[R TAN synth-2D]
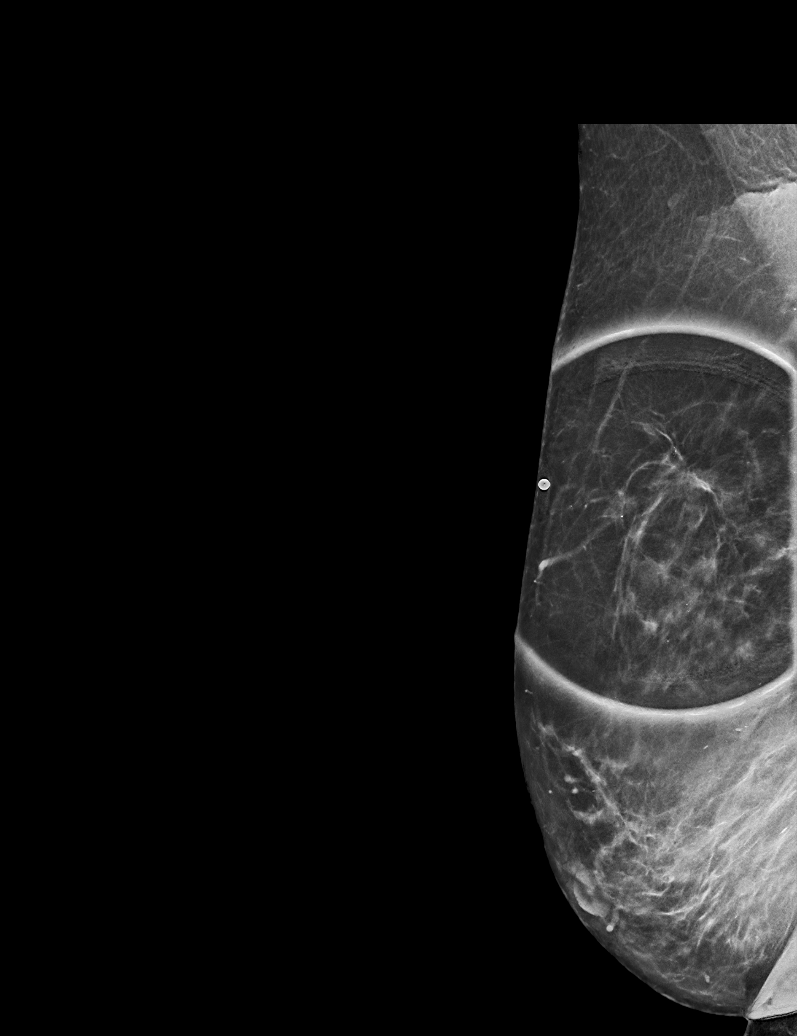

[L CC synth-2D]
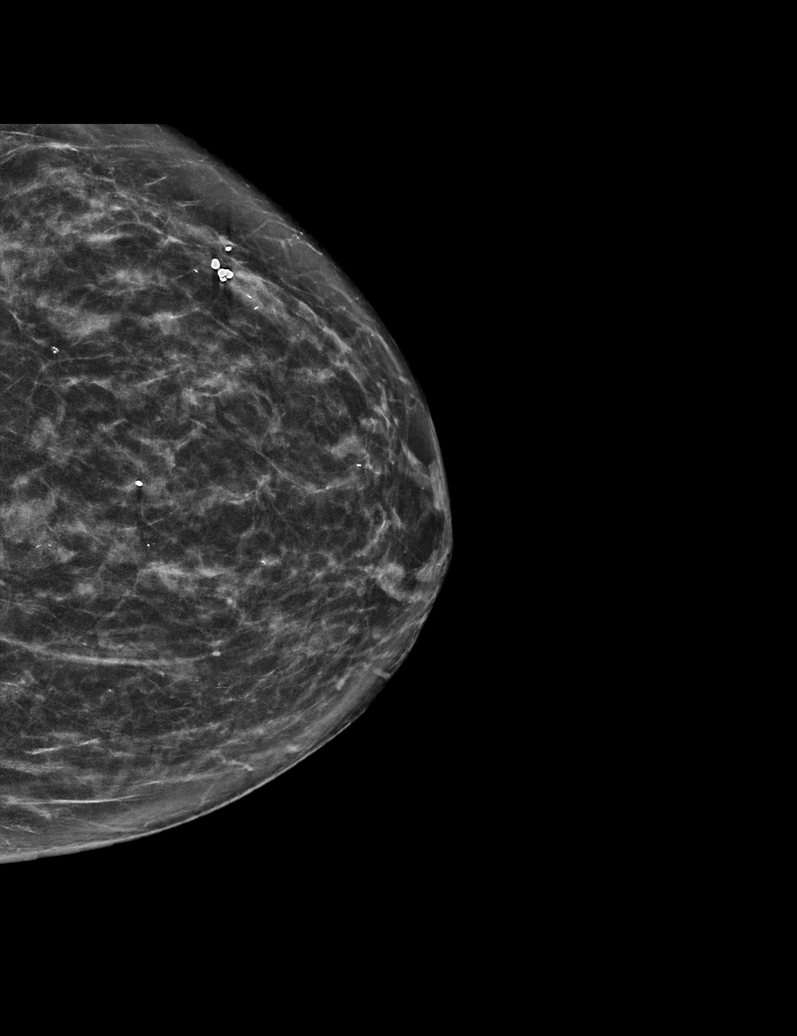

[L MLO synth-2D (1 of 2)]
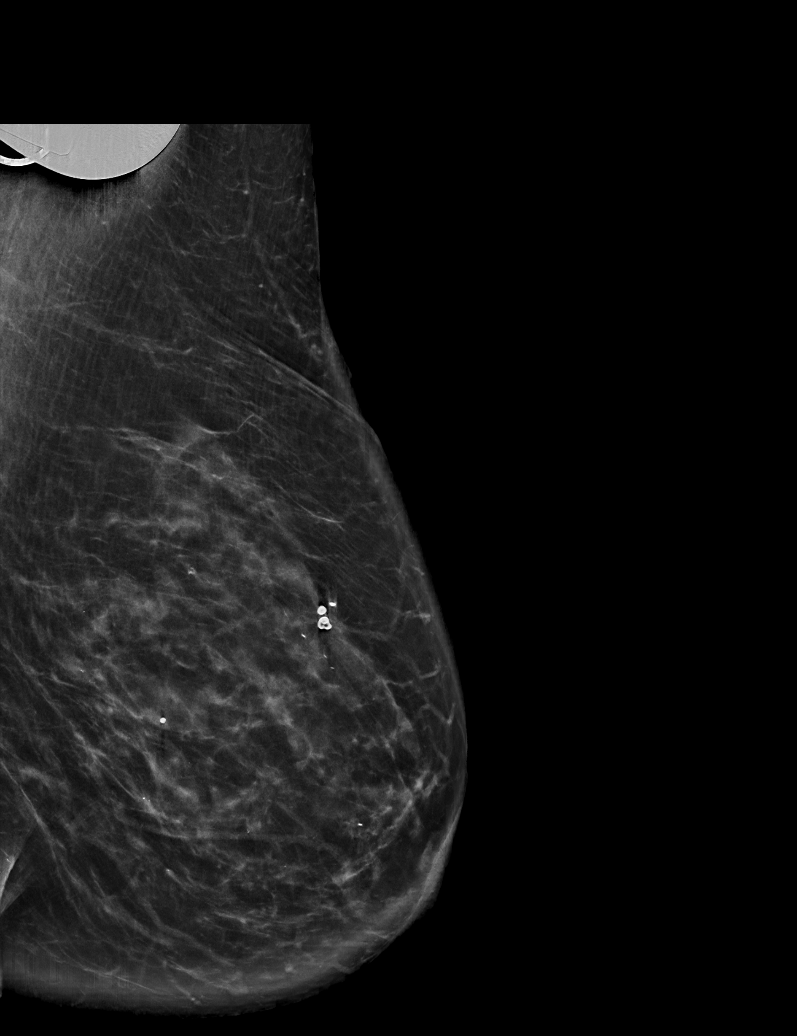

[R MLO synth-2D]
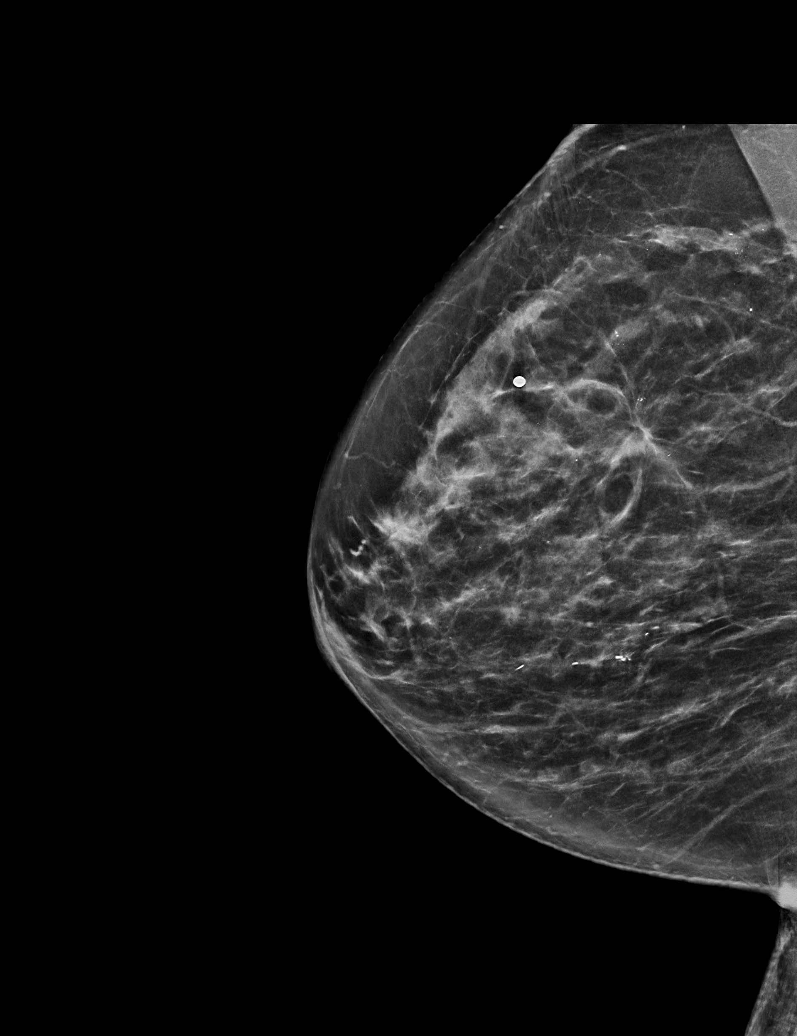

[R CC synth-2D]
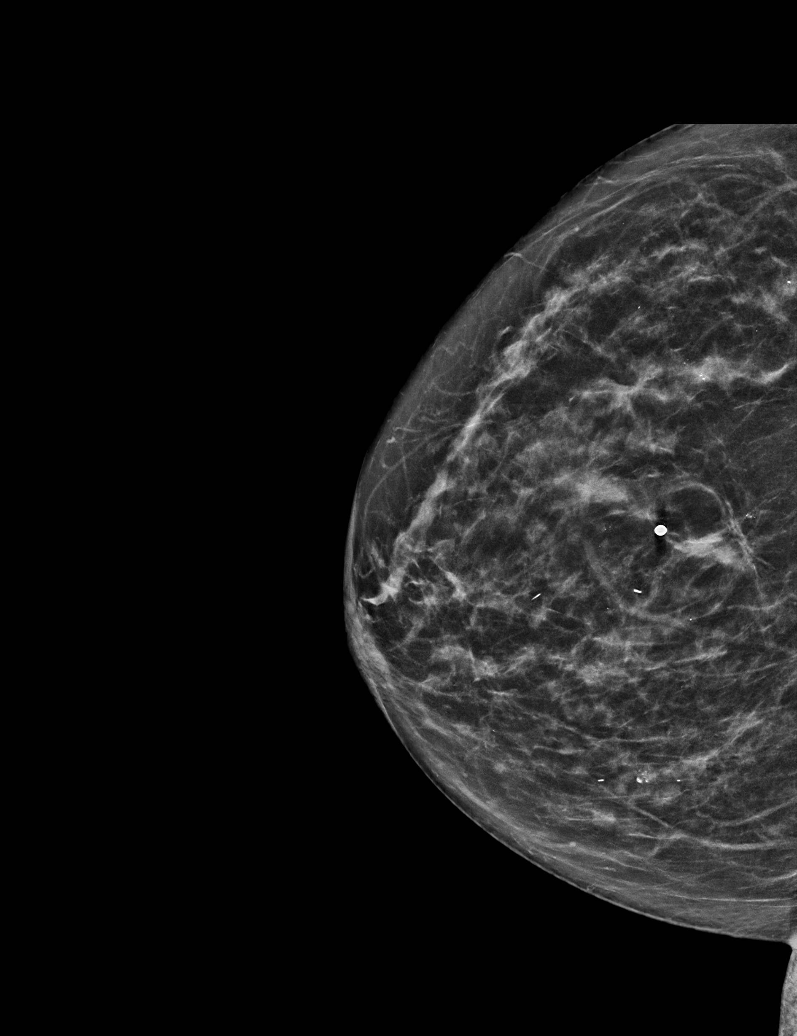

[L MLO synth-2D (2 of 2)]
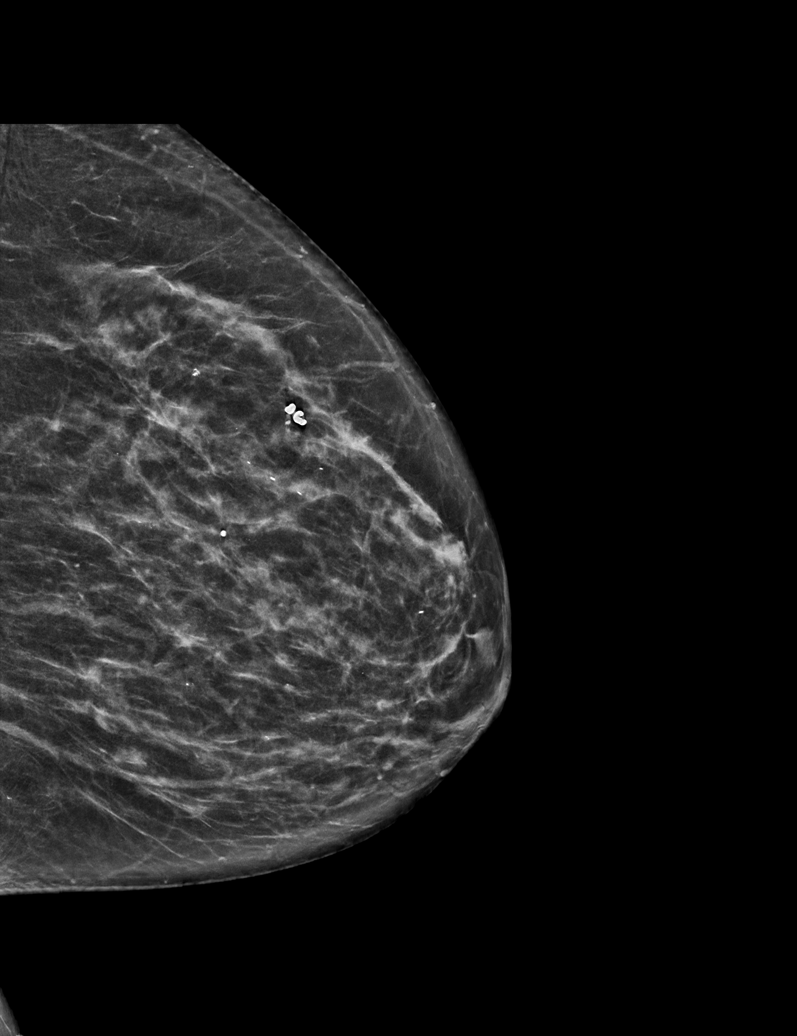

[6 of 36 positions shown; findings below may reference images not displayed]

ACR Breast Density Category b: There are scattered areas of
fibroglandular density.
FINDINGS: There is an ill-defined mass with associated architectural
distortion in the upper posterior right breast corresponding to the
palpable abnormality. There is a small oval circumscribed mass in
the lower inner quadrant of the right breast. On the left, there is
a circumscribed mass in the retroareolar region.

Mammographic images were processed with CAD.

On physical exam, there is a firm tender mass in the upper slightly
inner aspect of the right breast. No other defined masses.

Targeted right breast ultrasound is performed, showing a hypoechoic
irregular ill-defined mass with posterior acoustic shadowing in the
right breast at 12 o'clock, 5 cm the nipple, measuring 1.5 x 0.9 x
0.9 cm. In the 3 o'clock position, 4 cm from the nipple, there is an
oval circumscribed mass with a small notch of eccentric fat
measuring 4 x 2 x 3 mm, consistent with an intramammary lymph node
and consistent in size, shape and location to the small
circumscribed mass seen in the medial right breast mammographically.
In the right axilla, there are no enlarged or abnormal lymph nodes.

Targeted left breast ultrasound is performed, showing an oval cyst
in the retroareolar breast at 6 o'clock, 1 cm from the nipple,
measuring 8 x 6 x 5 mm. There is a second smaller cyst also in the
retroareolar region measuring 4 mm in diameter. No solid masses or
suspicious lesions.
IMPRESSION: 1. 1.5 cm mass in the 12 o'clock position of the right breast
corresponding to the palpable abnormality. This is highly suspicious
for breast malignancy. Tissue sampling is indicated. No other
evidence of breast malignancy.
2. Normal intramammary lymph node in the medial right breast.
3. Benign cysts in the retroareolar left breast.

RECOMMENDATION:
1. Ultrasound-guided core needle biopsy of the 12 o'clock position
right breast mass. This has been scheduled for [REDACTED], [REDACTED],

I have discussed the findings and recommendations with the patient.
If applicable, a reminder letter will be sent to the patient
regarding the next appointment.

BI-RADS CATEGORY  5: Highly suggestive of malignancy.

## 2022-02-10 IMAGING — US US  BREAST BX W/ LOC DEV 1ST LESION IMG BX SPEC US GUIDE*R*
1 series · 10 of 10 positions shown · non-contrast
Comparison: Previous exam(s).
COMPARISON: Previous exam(s).

Addendum:
CLINICAL DATA: [AGE] presenting with a palpable 1.5 cm mass
associated with architectural distortion involving the UPPER RIGHT
breast at the 12 o'clock position approximately 5 cm from the
nipple.

EXAM:
ULTRASOUND GUIDED RIGHT BREAST CORE NEEDLE BIOPSY

[Series 1: us breast bx w/ loc dev 1st lesion img bx spec us  · 0.07mm/px · 10 of 10 slices shown]
[im 1/10]
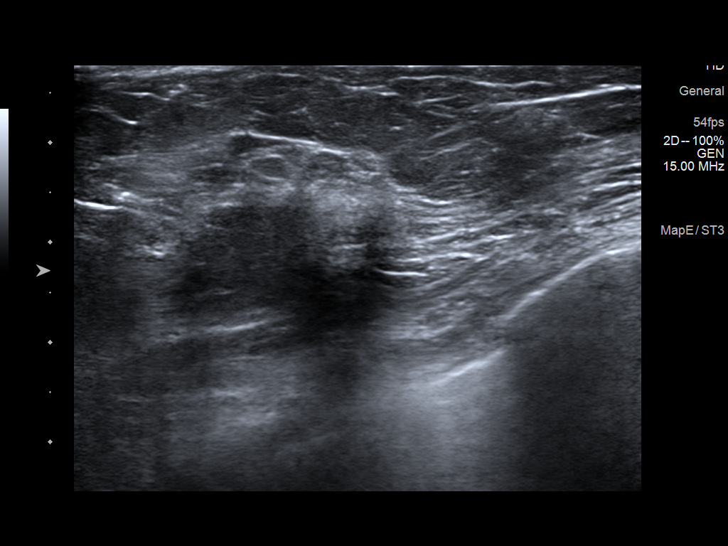
[im 2/10]
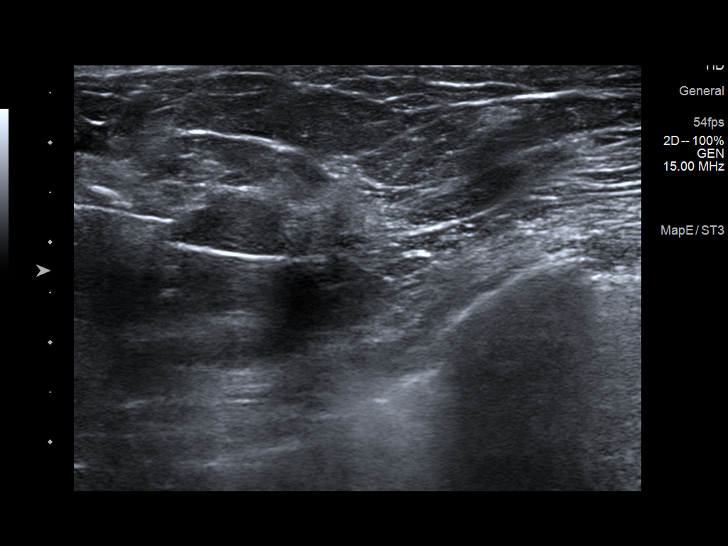
[im 3/10]
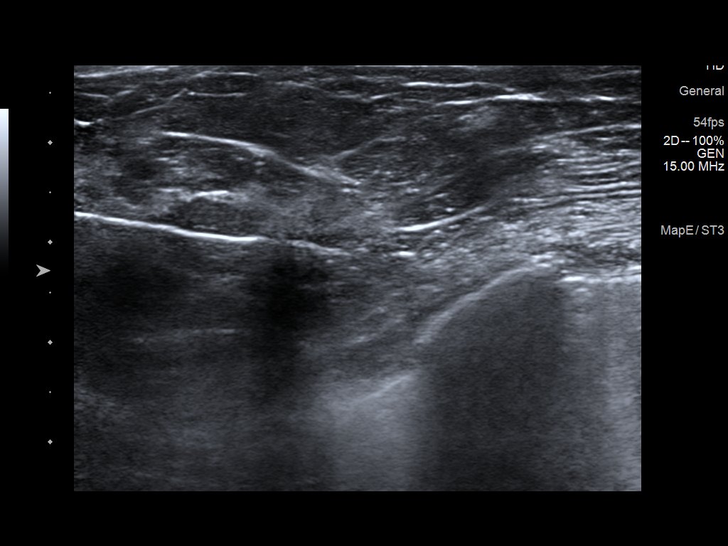
[im 4/10]
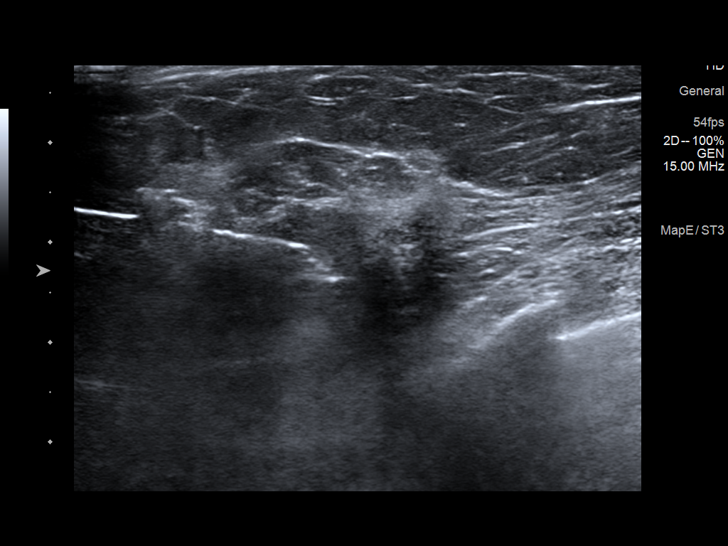
[im 5/10]
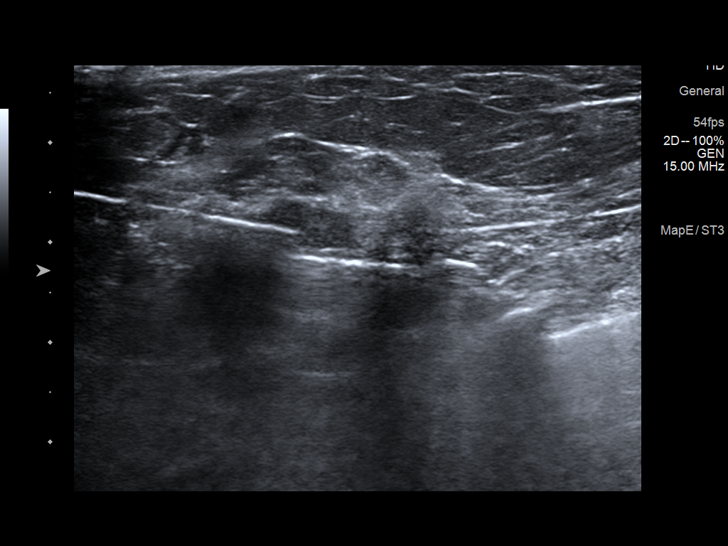
[im 6/10]
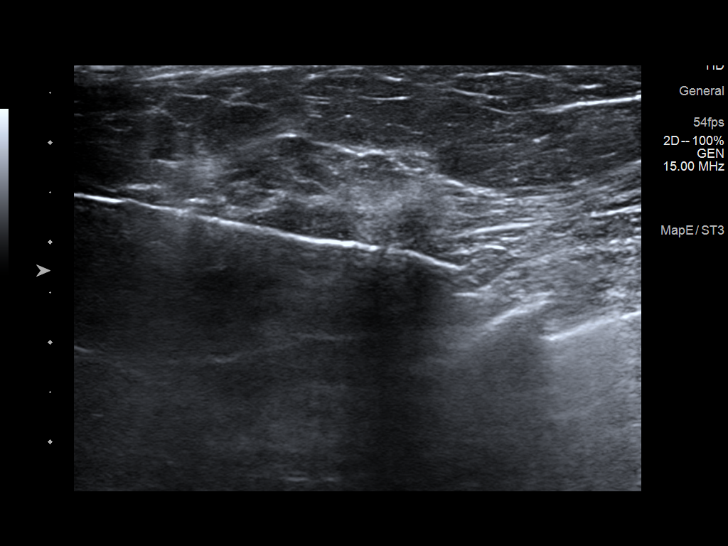
[im 7/10]
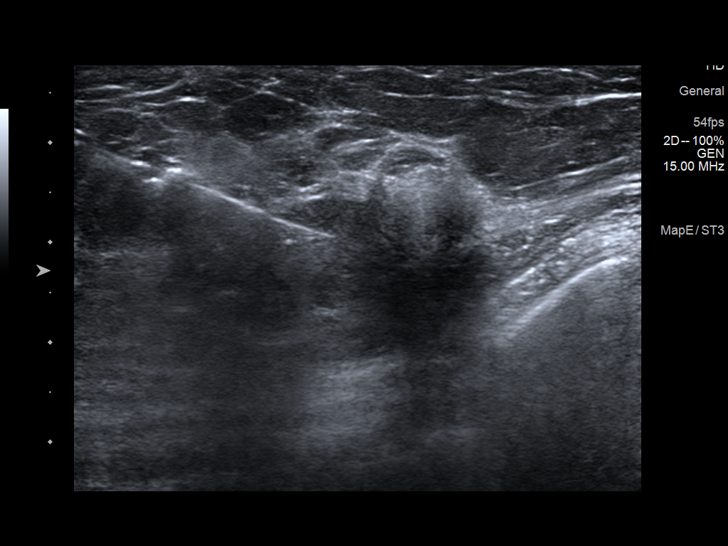
[im 8/10]
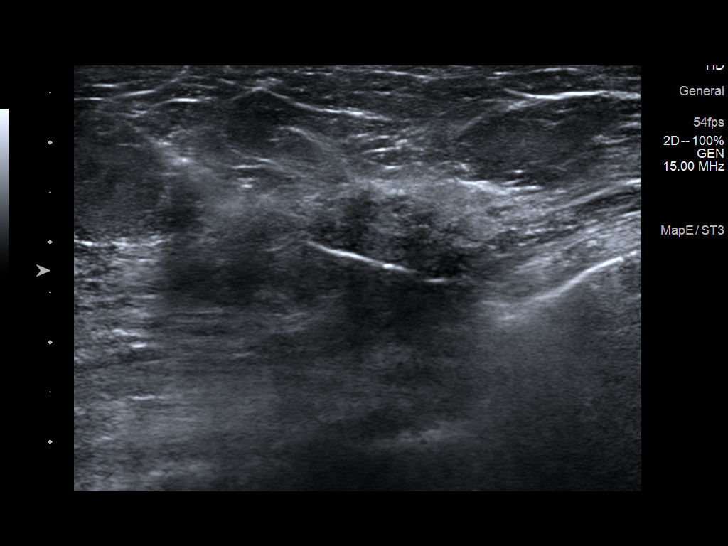
[im 9/10]
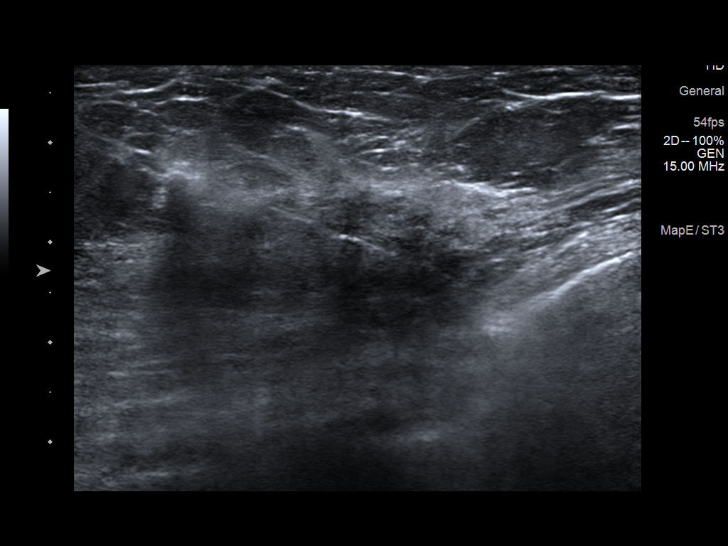
[im 10/10]
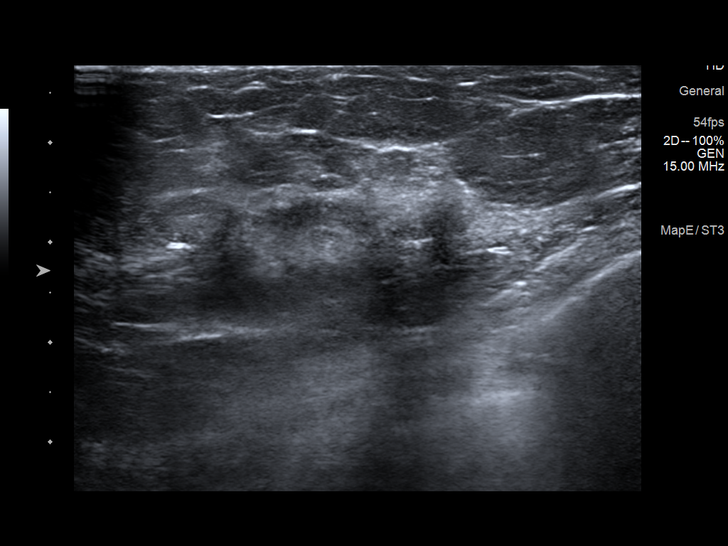

[10 of 10 positions shown; findings below may reference images not displayed]



Lesion quadrant: UPPER breast, 12 o'clock location.

Using sterile technique with chlorhexidine as skin antisepsis, 1%
lidocaine and 1% lidocaine with epinephrine as local anesthetic,
under direct ultrasound visualization, a 12 gauge Rudi Jumper core
needle device placed through an 11 gauge introducer needle was used
to perform biopsy of the mass involving the UPPER RIGHT breast using
a lateral approach.

At the conclusion of the procedure a ribbon shaped tissue marker
clip was deployed into the biopsy cavity. Follow up 2 view mammogram
was performed and dictated separately.
IMPRESSION: Ultrasound guided biopsy of a mass associated with architectural
distortion in the UPPER RIGHT breast. No apparent complications.

ADDENDUM:
Pathology revealed GRADE II INVASIVE MAMMARY CARCINOMA of the Right
breast, 12 o'clock, 8cmfn. This was found to be concordant by Dr.
Juan L Tso.

Pathology results were discussed with the patient's Ala Bien, Baoul
Baskaran, POA, by telephone, per request. The patient's niece
reported her aunt did well after the biopsy with tenderness at the
site. Post biopsy instructions and care were reviewed and questions
were answered. Mrs. Soltuz was encouraged to call The [REDACTED] for any additional concerns. My direct phone
number was provided for the patient.

Medical oncology referral has been arranged with Dr. Valalo Watercolor at
[HOSPITAL] [HOSPITAL] on November 21, 2019.

Pathology results reported by Kleber Vicente Apolo, RN on 11/16/2019.



Lesion quadrant: UPPER breast, 12 o'clock location.

Using sterile technique with chlorhexidine as skin antisepsis, 1%
lidocaine and 1% lidocaine with epinephrine as local anesthetic,
under direct ultrasound visualization, a 12 gauge Rudi Jumper core
needle device placed through an 11 gauge introducer needle was used
to perform biopsy of the mass involving the UPPER RIGHT breast using
a lateral approach.

At the conclusion of the procedure a ribbon shaped tissue marker
clip was deployed into the biopsy cavity. Follow up 2 view mammogram
was performed and dictated separately.
IMPRESSION: Ultrasound guided biopsy of a mass associated with architectural
distortion in the UPPER RIGHT breast. No apparent complications.

## 2022-02-18 NOTE — Progress Notes (Signed)
Patient Care Team: [name], MD as PCP - General (Internal Medicine) [name], [name], MD as PCP - Cardiology (Cardiology) [name], MD (Inactive) as Consulting Physician (Urology) Penni Bombard, MD as Consulting Physician (Neurology) [name], MD as Consulting Physician (Neurology) [name], [name], MD as Consulting Physician (Cardiology) [name], MD (Inactive) as Consulting Physician (Gastroenterology) [name], RN as Oncology Nurse Navigator [name], RN as Oncology Nurse Navigator  DIAGNOSIS:  Encounter Diagnosis  Name Primary?   Malignant neoplasm of upper-outer quadrant of right breast in female, estrogen receptor positive ([place])     SUMMARY OF ONCOLOGIC HISTORY: Oncology History  Malignant neoplasm of upper-outer quadrant of right breast in female, estrogen receptor positive ([place])  11/15/2019 Cancer Staging   Staging form: Breast, AJCC 8th Edition - Clinical stage from 11/15/2019: Stage IA (cT1c, cN0, cM0, G2, ER+, PR+, HER2-) - Signed by [name], NP on 11/21/2019   11/15/2019 Initial Biopsy   Palpable right breast mass: 1.5 cm at 12 o'clock position, benign intramammary lymph node, axilla negative, right breast biopsy 1 grade 2 ER 80%, PR 50%, Ki-67 5%, HER-2 negative   12/13/2019 Surgery   Right lumpectomy ([facility]i): invasive lobular carcinoma, grade 2, 2.4cm, clear margins.      CHIEF COMPLIANT: Follow-up of right breast cancer on anastrozole   INTERVAL HISTORY: Misty Blackwell is a 88 y.o. with above-mentioned history of right breast cancer who underwent a right lumpectomy and is currently on antiestrogen therapy with anastrozole. She presents to the clinic today for follow-up. She reports that has not been doing well. She fell at the store she states that she don't remember going out the door. She was in the hospital for like a week in a half. She states that she had a concussion. This all happened the  first day of June. She has a cane and walker for support. Overall she is doing fine taking the anastrozole.    ALLERGIES:  is allergic to latex, ace inhibitors, augmentin [amoxicillin-pot clavulanate], ciprofloxacin, levaquin [levofloxacin in d5w], zocor [simvastatin], acrylic polymer [carbomer], chocolate, and gabapentin.  MEDICATIONS:  Current Outpatient Medications  Medication Sig Dispense Refill   acidophilus (RISAQUAD) CAPS capsule Take 1 capsule by mouth daily.     anastrozole (ARIMIDEX) 1 MG tablet TAKE 1 TABLET(1 MG) BY MOUTH DAILY (Patient taking differently: Take 1 mg by mouth daily.) 90 tablet 3   Ascorbic Acid (VITAMIN C) 1000 MG tablet Take 1,000 mg by mouth daily.      Cholecalciferol (VITAMIN D-3) 125 MCG (5000 UT) TABS Take 5,000 Units by mouth daily.     Cyanocobalamin (VITAMIN B 12 PO) Take 1,000 mcg by mouth daily.      diltiazem (CARDIZEM CD) 300 MG 24 hr capsule Take 1 capsule (300 mg total) by mouth daily. 90 capsule 3   Ferrous Sulfate 90 (18 Fe) MG TABS Take 18 mg by mouth daily with breakfast.     furosemide (LASIX) 20 MG tablet Take 2 tablets (40 mg total) by mouth daily. (Patient taking differently: Take 20 mg by mouth in the morning and at bedtime.) 30 tablet 11   hydrOXYzine (VISTARIL) 100 MG capsule Take  1 capsule  at Bedtime  for Sleep 90 capsule 3   irbesartan (AVAPRO) 300 MG tablet Take  1 tablet  Daily  for BP & Heart 90 tablet 3   Magnesium 250 MG TABS Take 250 mg by mouth daily.     OVER THE COUNTER MEDICATION  Apply 1 application. topically See admin instructions. Neuropathy maximum strength Nerve relief and recovery cream- Apply to the feet at bedtime     potassium chloride SA (KLOR-CON M) 20 MEQ tablet Take  1 tablet  2 x /day  for Potassium (Patient taking differently: Take 20 mEq by mouth in the morning and at bedtime.) 180 tablet 1   warfarin (COUMADIN) 3 MG tablet TAKE 1 TO 1 AND 1/2 TABLET BY MOUTH AS DIRECTED 135 tablet 1   zinc gluconate 50 MG  tablet Take 50 mg by mouth daily.     No current facility-administered medications for this visit.    PHYSICAL EXAMINATION: ECOG PERFORMANCE STATUS: 1 - Symptomatic but completely ambulatory  Vitals:   02/22/22 0814  BP: (!) 163/75  Pulse: 91  Resp: 18  Temp: 97.8 F (36.6 C)  SpO2: 96%   Filed Weights   02/22/22 0814  Weight: 151 lb 1.6 oz (68.5 kg)    LABORATORY DATA:  I have reviewed the data as listed    Latest Ref Rng & Units 01/26/2022   12:08 PM 11/23/2021   10:38 AM 10/28/2021    4:01 PM  CMP  Glucose 65 - 99 mg/dL 87  81  92   BUN 7 - 25 mg/dL _0 Creatinine 0.60 - 0.95 mg/dL 0.89  0.80  0.80   Sodium 135 - 146 mmol/L 140  141  140   Potassium 3.5 - 5.3 mmol/L 4.7  4.5  5.2   Chloride 98 - 110 mmol/L 102  103  103   CO2 20 - 32 mmol/L _1 Calcium 8.6 - 10.4 mg/dL 9.7  9.5  9.8   Total Protein 6.1 - 8.1 g/dL 7.4  7.0  7.1   Total Bilirubin 0.2 - 1.2 mg/dL 0.6  0.5  0.4   AST 10 - 35 U/L _2 ALT 6 - 29 U/L _3 Lab Results  Component Value Date   WBC 6.8 01/26/2022   HGB 14.2 01/26/2022   HCT 42.7 01/26/2022   MCV 92.4 01/26/2022   PLT 243 01/26/2022   NEUTROABS 3,624 01/26/2022    ASSESSMENT & PLAN:  Malignant neoplasm of upper-outer quadrant of right breast in female, estrogen receptor positive ([place]) 12/13/19: Right lumpectomy (Ts[facility]i): invasive lobular carcinoma, grade 2, 2.4cm, clear margins.  ER 80%, PR 50%, HER-2 negative, Ki-67 5%   Treatment plan: Anastrozole adjuvant therapy 1 mg daily x5 years started September 2021 Anastrozole toxicities: Hot flashes have resolved. Otherwise tolerating it extremely well.    Breast cancer surveillance: 1.  Breast exam 02/23/2019: Benign (she did not want to be examined today) 2. Mammogram  12/09/2021.  Palpable area of concern in the right breast: No ultrasound or mammographic correlates Benign density Cat B   She likes to travel around with her church group to hear  [place] speakers. Chronic peripheral neuropathy: Improving.  She is now using a cane to get around.  Return to clinic in 1 year for follow-up    No orders of the defined types were placed in this encounter.  The patient has a good understanding of the overall plan. she agrees with it. she will call with any problems that may develop before the next visit here. Total time spent: 30 mins including face to face time and time spent for planning, charting and co-ordination of care  Harriette Ohara, MD 02/22/22    I Gardiner Coins am scribing for Dr. Lindi Adie  I have reviewed the above documentation for accuracy and completeness, and I agree with the above.

## 2022-02-22 ENCOUNTER — Inpatient Hospital Stay: Payer: Medicare Other | Attending: Hematology and Oncology | Admitting: Hematology and Oncology

## 2022-02-22 ENCOUNTER — Other Ambulatory Visit: Payer: Self-pay

## 2022-02-22 DIAGNOSIS — Z79811 Long term (current) use of aromatase inhibitors: Secondary | ICD-10-CM | POA: Insufficient documentation

## 2022-02-22 DIAGNOSIS — Z170 Estrogen receptor positive status [ER+]: Secondary | ICD-10-CM | POA: Diagnosis not present

## 2022-02-22 DIAGNOSIS — C50411 Malignant neoplasm of upper-outer quadrant of right female breast: Secondary | ICD-10-CM | POA: Diagnosis present

## 2022-02-22 NOTE — Assessment & Plan Note (Addendum)
12/13/19:Right lumpectomy ([facility]): invasive lobular carcinoma, grade 2, 2.4cm, clear margins.ER 80%, PR 50%, HER-2 negative, Ki-67 5%  Treatment plan: Anastrozole adjuvant therapy 1 mg daily x5 years started September 2021 Anastrozole toxicities:Hot flashes have resolved. Otherwise tolerating it extremely well.  Breast cancer surveillance: 1.Breast exam10/23/2020: Benign (she did not want to be examined today) 2.Mammogram  12/09/2021.  Palpable area of concern in the right breast: No ultrasound or mammographic correlates Benign density Cat B  She likes to travel around with her church group to hear [employer]. Chronic peripheral neuropathy: Improving.  She is now using a cane to get around.  Return to clinic in1 year for follow-up

## 2022-02-23 ENCOUNTER — Ambulatory Visit (INDEPENDENT_AMBULATORY_CARE_PROVIDER_SITE_OTHER): Payer: Medicare Other

## 2022-02-23 DIAGNOSIS — I495 Sick sinus syndrome: Secondary | ICD-10-CM | POA: Diagnosis not present

## 2022-02-23 LAB — CUP PACEART REMOTE DEVICE CHECK
Battery Remaining Longevity: 158 mo
Battery Voltage: 3.18 V
Brady Statistic AP VP Percent: 0 %
Brady Statistic AP VS Percent: 0 %
Brady Statistic AS VP Percent: 93.08 %
Brady Statistic AS VS Percent: 6.92 %
Brady Statistic RA Percent Paced: 0 %
Brady Statistic RV Percent Paced: 93.08 %
Date Time Interrogation Session: 20231023201006
Implantable Lead Connection Status: 753985
Implantable Lead Connection Status: 753985
Implantable Lead Implant Date: 20110428
Implantable Lead Implant Date: 20110428
Implantable Lead Location: 753859
Implantable Lead Location: 753860
Implantable Lead Model: 4092
Implantable Lead Model: 4592
Implantable Pulse Generator Implant Date: 20230424
Lead Channel Impedance Value: 456 Ohm
Lead Channel Impedance Value: 494 Ohm
Lead Channel Impedance Value: 570 Ohm
Lead Channel Impedance Value: 608 Ohm
Lead Channel Pacing Threshold Amplitude: 0.75 V
Lead Channel Pacing Threshold Pulse Width: 0.4 ms
Lead Channel Sensing Intrinsic Amplitude: 1.375 mV
Lead Channel Sensing Intrinsic Amplitude: 12.875 mV
Lead Channel Sensing Intrinsic Amplitude: 12.875 mV
Lead Channel Setting Pacing Amplitude: 2 V
Lead Channel Setting Pacing Pulse Width: 0.4 ms
Lead Channel Setting Sensing Sensitivity: 1.2 mV
Zone Setting Status: 755011

## 2022-03-01 ENCOUNTER — Ambulatory Visit: Payer: Medicare Other | Attending: Cardiovascular Disease | Admitting: *Deleted

## 2022-03-01 DIAGNOSIS — I482 Chronic atrial fibrillation, unspecified: Secondary | ICD-10-CM

## 2022-03-01 DIAGNOSIS — Z7901 Long term (current) use of anticoagulants: Secondary | ICD-10-CM

## 2022-03-01 LAB — POCT INR: INR: 3 (ref 2.0–3.0)

## 2022-03-01 NOTE — Patient Instructions (Signed)
Description   Continue taking 1.5 tablets daily except 1 tablet Sunday, Tuesday and Thursday. Have some leafy veggies today and remain consistent. Repeat INR in 5 weeks. Call 336 [phone] with any questions

## 2022-03-15 NOTE — Progress Notes (Signed)
Remote pacemaker transmission.   

## 2022-04-06 ENCOUNTER — Ambulatory Visit: Payer: Medicare Other | Attending: Cardiology

## 2022-04-06 DIAGNOSIS — Z5181 Encounter for therapeutic drug level monitoring: Secondary | ICD-10-CM | POA: Diagnosis not present

## 2022-04-06 DIAGNOSIS — I482 Chronic atrial fibrillation, unspecified: Secondary | ICD-10-CM

## 2022-04-06 DIAGNOSIS — Z7901 Long term (current) use of anticoagulants: Secondary | ICD-10-CM | POA: Diagnosis not present

## 2022-04-06 LAB — POCT INR: INR: 3.3 — AB (ref 2.0–3.0)

## 2022-04-06 NOTE — Patient Instructions (Signed)
Continue taking 1.5 tablets daily except 1 tablet Sunday, Tuesday and Thursday. Have some leafy veggies today and remain consistent. Repeat INR in 5 weeks. Call 336 [phone] with any questions;  EAT GREENS TODAY

## 2022-04-15 ENCOUNTER — Encounter: Payer: Self-pay | Admitting: Internal Medicine

## 2022-04-17 ENCOUNTER — Other Ambulatory Visit: Payer: Self-pay | Admitting: Internal Medicine

## 2022-04-17 DIAGNOSIS — E876 Hypokalemia: Secondary | ICD-10-CM

## 2022-04-17 MED ORDER — POTASSIUM CHLORIDE CRYS ER 20 MEQ PO TBCR: EXTENDED_RELEASE_TABLET | ORAL | 3 refills | Status: DC

## 2022-04-28 NOTE — Progress Notes (Signed)
Annual Screening/Preventative Visit & Comprehensive Evaluation &  Examination   Future Appointments  Date Time Provider Department  04/29/2022  3:00 PM [name], MD [facility]  06/28/2022  9:30 AM [name], MD [facility]  08/20/2022  9:00 AM [name], NP [facility]  02/21/2023  8:45 AM [name], MD [facility]  05/02/2023  3:00 PM [name], MD [facility]        This very nice 88 y.o. [name] with HTN, HLD, Prediabetes  and Vitamin D Deficiency presents for a Screening /Preventative Visit & comprehensive evaluation and management of multiple medical co-morbidities.  In Aug  2021, patient was dx'd with  ductal Breast Ca of th Rt breast by Dr Georgette Dover  & She follows with Dr Lindi Adie recommending Anastrozole  Daily x 5 years.             HTN predates circa 1988.  Heart caths in  1993 & 2007 showed minimal or non-obstructive CAD.  In 2006, she was dx'd pAfib after an embolic TIA & has been on  coumadin since .  In 2007,  she had a PPM was implanted  for SSS . Patient's BP has been controlled at home and patient denies any cardiac symptoms as chest pain, palpitations, shortness of breath, dizziness or ankle swelling. Today's BP was at goal - 138/70 .                                           Patient's hyperlipidemia is not controlled with diet. Last lipids were not at goal :  Lab Results  Component Value Date   CHOL 177 01/26/2022   HDL 45 (L) 01/26/2022   LDLCALC 98 01/26/2022   TRIG 215 (H) 01/26/2022   CHOLHDL 3.9 01/26/2022         Patient has hx/o prediabetes with Insulin Resistance (A1c 5.9% /Insulin elev 86 /2011) and patient denies reactive hypoglycemic symptoms, visual blurring, diabetic polys or paresthesias. Last A1c was at goal:  Lab Results  Component Value Date   HGBA1C 5.6 01/13/2021         Finally, patient has history of Vitamin D Deficiency and last Vitamin D was at goal :  Lab Results  Component Value Date   VD25OH 61 01/26/2022        Current Outpatient Medications on File Prior to Visit  Medication Sig   acidophilus CAPS capsule Take 1 capsule  daily.   anastrozole 1 MG tablet Take 1 tablet daily.   VITAMIN C) 1000 MG tablet Take  daily.    VITAMIN D 2000 u Take    LOTRISONE cream Apply 1 application topically 2 (two) times daily.   VITAMIN B 12 tab   1,000 mcg  Take   daily.    diltiazem CD 240 MG 24 hr capsule Take 1 capsule daily.   furosemide  20 MG tablet Take 1 tablet  daily.   GNP IRON 200 (65 Fe) MG  Take    hydrOXYzine 50 MG tablet TAKE 1 TO 2 TABLETS EVERY NIGHT 1 HOUR BEFORE BEDTIME AS NEEDED FOR SLEEP   Magnesium 250 MG TABS Take daily.   potassium chloride 20 MEQ tablet Take  1 tablet  Daily    telmisartan  80 MG tablet TAKE 1 TABLET  DAILY   Warfarin  3 MG tablet TAKE 1 TO 1 &1/2 TABLET    zinc  50  MG tablet Take  daily.     Allergies  Allergen Reactions   Latex Itching   Ace Inhibitors Other (See Comments)    Unknown reaction   Augmentin [Amoxicillin-Pot Clavulanate] Other (See Comments)   Ciprofloxacin Other (See Comments)   Levaquin [Levofloxacin In D5w] Other (See Comments)   Zocor [Simvastatin] Other (See Comments)   Acrylic Polymer [Carbomer] Itching   Chocolate Other (See Comments)    migraine's    Gabapentin Other (See Comments)    Unsteady gait      Past Medical History:  Diagnosis Date   Anxiety    Atrial fib/flutter, transient    Atrial fibrillation, chronic (HCC)    CHF (congestive heart failure) ([place]) 10/29/2009   Echo - EF >55%; normal LV size and systolic function; unable to assess diastolic fcn due to E/A fusion, pulmonary vein flow pattern suggests elevated filling pressure; marked biatrail dilation, mild/mod tricuspid regurgitation; mod pulmonary htn; mild/mod mitral regurgitation; although echocardiographic features are incomplete findings suggest possible infiltrative cardiomyopathy (maybe amyloidosi   Coronary artery disease 03/19/2002   R/P Cardiolite - EF 76%;  nromal static and dynamic myocardial perfusion images; normal wall motion and endocardial thickening in all vascular territories   Dyspnea    Dysrhythmia    Facial numbness 12/26/2008   carotid doppler - R and L ICAs 0-49% diameter reduction (velocities suggest low end of scale)   Hypertension    Pacemaker    Peripheral neuropathy    Pneumonia 2017   Skin cancer    s/p surgical removal.   Stroke ([place]) 04/2017   TIA (transient ischemic attack)      Health Maintenance  Topic Date Due   Zoster Vaccines- Shingrix (1 of 2) Never done   DEXA SCAN  Never done   COVID-19 Vaccine (4 - Booster for Pfizer series) 05/16/2020   INFLUENZA VACCINE  12/01/2020   TETANUS/TDAP  03/18/2024   Pneumonia Vaccine 6+ Years old  Completed   HPV VACCINES  Aged Out     Immunization History  Administered Date(s) Administered   DT (Pediatric) 03/19/2015   Influenza Split 05/04/2011   Influenza, High Dose Seasonal PF 03/19/2014, 12/23/2015, 02/03/2017, 12/26/2018, 01/28/2020   Influenza,inj,quad, With Preservative 05/21/2013   Influenza-Unspecified 02/04/2015, 02/03/2017, 01/28/2020   PFIZER(Purple Top)SARS-COV-2 Vaccination 06/16/2019, 07/09/2019, 03/21/2020   Pneumococcal Conjugate-13 03/19/2014   Pneumococcal Polysaccharide-23 05/04/2011   Pneumococcal-Unspecified 05/03/2001   Td 05/04/2003    Last Colon - 09/10/2008 - no f/u due to age.   Last MGM - 12/01/2020    Past Surgical History:  Procedure Laterality Date   ABDOMINAL HYSTERECTOMY     APPENDECTOMY     BREAST LUMPECTOMY WITH RADIOACTIVE SEED LOCALIZATION Right 12/13/2019   Procedure: RIGHT BREAST LUMPECTOMY WITH RADIOACTIVE SEED LOCALIZATION;  Surgeon: Donnie Mesa, MD;  Location: [facility];  Service: General;  Laterality: Right;  LMA VS MAC   BREAST SURGERY Left 1949   CARDIAC CATHETERIZATION  08/06/2005   minimal coronary disease predominant RCA; no significant atherosclerosis; new onset sick sinus syndrome and atrial flutter w/  ventricular response, controlled on med therapy; systemic HTN, normal renal arteries   CARDIOVERSION  11/19/2009   successful DCCV from AF to sinus type rhythm   CHOLECYSTECTOMY     EYE SURGERY Bilateral 2013   Skin cancer resection     TONSILLECTOMY  1938     Family History  Problem Relation Age of Onset   Heart disease Mother    Diabetes Mother    Heart attack Father  Heart disease Father    Cirrhosis Brother      Social History   Tobacco Use   Smoking status: Never   Smokeless tobacco: Never  Vaping Use   Vaping Use: Never used  Substance Use Topics   Alcohol use: No   Drug use: No      ROS Constitutional: Denies fever, chills, weight loss/gain, headaches, insomnia,  night sweats, and change in appetite. Does c/o fatigue. Eyes: Denies redness, blurred vision, diplopia, discharge, itchy, watery eyes.  ENT: Denies discharge, congestion, post nasal drip, epistaxis, sore throat, earache, hearing loss, dental pain, Tinnitus, Vertigo, Sinus pain, snoring.  Cardio: Denies chest pain, palpitations, irregular heartbeat, syncope, dyspnea, diaphoresis, orthopnea, PND, claudication, edema Respiratory: denies cough, dyspnea, DOE, pleurisy, hoarseness, laryngitis, wheezing.  Gastrointestinal: Denies dysphagia, heartburn, reflux, water brash, pain, cramps, nausea, vomiting, bloating, diarrhea, constipation, hematemesis, melena, hematochezia, jaundice, hemorrhoids Genitourinary: Denies dysuria, frequency, urgency, nocturia, hesitancy, discharge, hematuria, flank pain Breast: Breast lumps, nipple discharge, bleeding.  Musculoskeletal: Denies arthralgia, myalgia, stiffness, Jt. Swelling, pain, limp, and strain/sprain. Denies falls. Skin: Denies puritis, rash, hives, warts, acne, eczema, changing in skin lesion Neuro: No weakness, tremor, incoordination, spasms, paresthesia, pain Psychiatric: Denies confusion, memory loss, sensory loss. Denies Depression. Endocrine: Denies change in  weight, skin, hair change, nocturia, and paresthesia, diabetic polys, visual blurring, hyper / hypo glycemic episodes.  Heme/Lymph: No excessive bleeding, bruising, enlarged lymph nodes.  Physical Exam  BP (!) 140/70   Pulse 91   Temp 97.9 F (36.6 C)   Resp 17   Ht 5' 4.5" (1.638 m)   Wt 151 lb 12.8 oz (68.9 kg)   SpO2 96%   BMI 25.65 kg/m   General Appearance: Well nourished, well groomed and in no apparent distress.  Eyes: PERRLA, EOMs, conjunctiva no swelling or erythema, normal fundi and vessels. Sinuses: No frontal/maxillary tenderness ENT/Mouth: EACs patent / TMs  nl. Nares clear without erythema, swelling, mucoid exudates. Oral hygiene is good. No erythema, swelling, or exudate. Tongue normal, non-obstructing. Tonsils not swollen or erythematous. Hearing normal.  Neck: Supple, thyroid not palpable. No bruits, nodes or JVD. Respiratory: Respiratory effort normal.  BS equal and clear bilateral without rales, rhonci, wheezing or stridor. Cardio: Heart sounds are normal with regular rate and rhythm and no murmurs, rubs or gallops. Peripheral pulses are normal and equal bilaterally without edema. No aortic or femoral bruits. Chest: symmetric with normal excursions and percussion. Breasts: Symmetric, without lumps, nipple discharge, retractions, or fibrocystic changes.  Abdomen: Flat, soft with bowel sounds active. Nontender, no guarding, rebound, hernias, masses, or organomegaly.  Lymphatics: Non tender without lymphadenopathy.  Genitourinary:  Musculoskeletal: Full ROM all peripheral extremities, joint stability, 5/5 strength, and normal gait. Skin: Warm and dry without rashes, lesions, cyanosis, clubbing or  ecchymosis.  Neuro: Cranial nerves intact, reflexes equal bilaterally. Normal muscle tone, no cerebellar symptoms. Sensation intact.  Pysch: Alert and oriented X 3, normal affect, Insight and Judgment appropriate.    Assessment and Plan  1. Annual Preventative Screening  Examination   2. Essential hypertension  - EKG 12-Lead - Urinalysis, Routine w reflex microscopic - Microalbumin / creatinine urine ratio - CBC with Differential/Platelet - COMPLETE METABOLIC PANEL WITH GFR - Magnesium - TSH  3. Hyperlipidemia, mixed  - EKG 12-Lead - Lipid panel - TSH  4. Abnormal glucose  - EKG 12-Lead - Hemoglobin A1c - Insulin, random  5. Vitamin D deficiency  - VITAMIN D 25 Hydroxy   6. Chronic atrial fibrillation (HCC)  - EKG  12-Lead - TSH  7. Pacemaker  - EKG 12-Lead - Lipid panel  8. Aortic atherosclerosis ([place]) by CXR on 04/03/2020  - EKG 12-Lead - Lipid panel  9. Screening for colorectal cancer  - POC Hemoccult Bld/Stl   10. Gastroesophageal reflux disease without esophagitis  - CBC with Differential/Platelet  11. Long term current use of anticoagulant therapy   12. Medication management  - Urinalysis, Routine w reflex microscopic - Microalbumin / creatinine urine ratio - CBC with Differential/Platelet - COMPLETE METABOLIC PANEL WITH GFR - Magnesium - Lipid panel - TSH - Hemoglobin A1c - Insulin, random - VITAMIN D 25 Hydroxy          Patient was counseled in prudent diet to achieve/maintain BMI less than 25 for weight control, BP monitoring, regular exercise and medications. Discussed med's effects and SE's. Screening labs and tests as requested with regular follow-up as recommended. Over 40 minutes of exam, counseling, chart review and high complex critical decision making was performed.   Kirtland Bouchard, MD

## 2022-04-28 NOTE — Patient Instructions (Signed)

## 2022-04-29 ENCOUNTER — Encounter: Payer: Self-pay | Admitting: Internal Medicine

## 2022-04-29 ENCOUNTER — Ambulatory Visit (INDEPENDENT_AMBULATORY_CARE_PROVIDER_SITE_OTHER): Payer: Medicare Other | Admitting: Internal Medicine

## 2022-04-29 VITALS — BP 138/70 | HR 91 | Temp 97.9°F | Resp 17 | Ht 64.5 in | Wt 151.8 lb

## 2022-04-29 DIAGNOSIS — I10 Essential (primary) hypertension: Secondary | ICD-10-CM

## 2022-04-29 DIAGNOSIS — Z950 Presence of cardiac pacemaker: Secondary | ICD-10-CM

## 2022-04-29 DIAGNOSIS — Z79899 Other long term (current) drug therapy: Secondary | ICD-10-CM

## 2022-04-29 DIAGNOSIS — I482 Chronic atrial fibrillation, unspecified: Secondary | ICD-10-CM

## 2022-04-29 DIAGNOSIS — F3341 Major depressive disorder, recurrent, in partial remission: Secondary | ICD-10-CM

## 2022-04-29 DIAGNOSIS — G629 Polyneuropathy, unspecified: Secondary | ICD-10-CM

## 2022-04-29 DIAGNOSIS — R7309 Other abnormal glucose: Secondary | ICD-10-CM

## 2022-04-29 DIAGNOSIS — Z7901 Long term (current) use of anticoagulants: Secondary | ICD-10-CM

## 2022-04-29 DIAGNOSIS — Z0000 Encounter for general adult medical examination without abnormal findings: Secondary | ICD-10-CM | POA: Diagnosis not present

## 2022-04-29 DIAGNOSIS — K219 Gastro-esophageal reflux disease without esophagitis: Secondary | ICD-10-CM

## 2022-04-29 DIAGNOSIS — Z1211 Encounter for screening for malignant neoplasm of colon: Secondary | ICD-10-CM

## 2022-04-29 DIAGNOSIS — I2510 Atherosclerotic heart disease of native coronary artery without angina pectoris: Secondary | ICD-10-CM

## 2022-04-29 DIAGNOSIS — I700 Atherosclerosis of aorta: Secondary | ICD-10-CM

## 2022-04-29 DIAGNOSIS — Z0001 Encounter for general adult medical examination with abnormal findings: Secondary | ICD-10-CM

## 2022-04-29 DIAGNOSIS — E559 Vitamin D deficiency, unspecified: Secondary | ICD-10-CM

## 2022-04-29 DIAGNOSIS — Z136 Encounter for screening for cardiovascular disorders: Secondary | ICD-10-CM

## 2022-04-29 DIAGNOSIS — E782 Mixed hyperlipidemia: Secondary | ICD-10-CM

## 2022-04-29 MED ORDER — ESCITALOPRAM OXALATE 10 MG PO TABS: ORAL_TABLET | ORAL | 3 refills | Status: DC

## 2022-04-29 MED ORDER — GABAPENTIN 100 MG PO CAPS: ORAL_CAPSULE | ORAL | 3 refills | Status: DC

## 2022-04-30 LAB — COMPLETE METABOLIC PANEL WITH GFR
AG Ratio: 1.5 (calc) (ref 1.0–2.5)
ALT: 12 U/L (ref 6–29)
AST: 16 U/L (ref 10–35)
Albumin: 4.3 g/dL (ref 3.6–5.1)
Alkaline phosphatase (APISO): 130 U/L (ref 37–153)
BUN/Creatinine Ratio: 33 (calc) — ABNORMAL HIGH (ref 6–22)
BUN: 26 mg/dL — ABNORMAL HIGH (ref 7–25)
CO2: 30 mmol/L (ref 20–32)
Calcium: 9.6 mg/dL (ref 8.6–10.4)
Chloride: 103 mmol/L (ref 98–110)
Creat: 0.78 mg/dL (ref 0.60–0.95)
Globulin: 2.8 g/dL (calc) (ref 1.9–3.7)
Glucose, Bld: 89 mg/dL (ref 65–99)
Potassium: 4.3 mmol/L (ref 3.5–5.3)
Sodium: 143 mmol/L (ref 135–146)
Total Bilirubin: 0.6 mg/dL (ref 0.2–1.2)
Total Protein: 7.1 g/dL (ref 6.1–8.1)
eGFR: 71 mL/min/{1.73_m2} (ref >=60)

## 2022-04-30 LAB — URINALYSIS, ROUTINE W REFLEX MICROSCOPIC
Bilirubin Urine: NEGATIVE
Glucose, UA: NEGATIVE
Hgb urine dipstick: NEGATIVE
Ketones, ur: NEGATIVE
Leukocytes,Ua: NEGATIVE
Nitrite: NEGATIVE
Protein, ur: NEGATIVE
Specific Gravity, Urine: 1.017 (ref 1.001–1.035)
pH: 5.5 (ref 5.0–8.0)

## 2022-04-30 LAB — TSH: TSH: 1.69 mIU/L (ref 0.40–4.50)

## 2022-04-30 LAB — CBC WITH DIFFERENTIAL/PLATELET
Absolute Monocytes: 701 cells/uL (ref 200–950)
Basophils Absolute: 58 cells/uL (ref 0–200)
Basophils Relative: 0.8 %
Eosinophils Absolute: 73 cells/uL (ref 15–500)
Eosinophils Relative: 1 %
HCT: 38.6 % (ref 35.0–45.0)
Hemoglobin: 13.1 g/dL (ref 11.7–15.5)
Lymphs Abs: 2431 cells/uL (ref 850–3900)
MCH: 31.2 pg (ref 27.0–33.0)
MCHC: 33.9 g/dL (ref 32.0–36.0)
MCV: 91.9 fL (ref 80.0–100.0)
MPV: 10.5 fL (ref 7.5–12.5)
Monocytes Relative: 9.6 %
Neutro Abs: 4037 cells/uL (ref 1500–7800)
Neutrophils Relative %: 55.3 %
Platelets: 236 10*3/uL (ref 140–400)
RBC: 4.2 10*6/uL (ref 3.80–5.10)
RDW: 12 % (ref 11.0–15.0)
Total Lymphocyte: 33.3 %
WBC: 7.3 10*3/uL (ref 3.8–10.8)

## 2022-04-30 LAB — LIPID PANEL
Cholesterol: 170 mg/dL (ref <200)
HDL: 43 mg/dL — ABNORMAL LOW (ref >=50)
LDL Cholesterol (Calc): 103 mg/dL (calc) — ABNORMAL HIGH
Non-HDL Cholesterol (Calc): 127 mg/dL (calc) (ref <130)
Total CHOL/HDL Ratio: 4 (calc) (ref <5.0)
Triglycerides: 141 mg/dL (ref <150)

## 2022-04-30 LAB — MICROALBUMIN / CREATININE URINE RATIO
Creatinine, Urine: 59 mg/dL (ref 20–275)
Microalb Creat Ratio: 8 mcg/mg creat (ref <30)
Microalb, Ur: 0.5 mg/dL

## 2022-04-30 LAB — INSULIN, RANDOM: Insulin: 11.2 u[IU]/mL

## 2022-04-30 LAB — HEMOGLOBIN A1C
Hgb A1c MFr Bld: 5.7 % of total Hgb — ABNORMAL HIGH (ref <5.7)
Mean Plasma Glucose: 117 mg/dL
eAG (mmol/L): 6.5 mmol/L

## 2022-04-30 LAB — MAGNESIUM: Magnesium: 2.2 mg/dL (ref 1.5–2.5)

## 2022-04-30 LAB — VITAMIN D 25 HYDROXY (VIT D DEFICIENCY, FRACTURES): Vit D, 25-Hydroxy: 67 ng/mL (ref 30–100)

## 2022-04-30 NOTE — Progress Notes (Signed)
<><><><><><><><><><><><><><><><><><><><><><><><><><><><><><><><><> <><><><><><><><><><><><><><><><><><><><><><><><><><><><><><><><><> -   Test results slightly outside the reference range are not unusual. If there is anything important, I will review this with you,  otherwise it is considered normal test values.  If you have further questions,  please do not hesitate to contact me at the office or via My Chart.  <><><><><><><><><><><><><><><><><><><><><><><><><><><><><><><><><> <><><><><><><><><><><><><><><><><><><><><><><><><><><><><><><><><>  -  Total Chol = 170    Excellent   - Very low risk for Heart Attack  / Stroke <><><><><><><><><><><><><><><><><><><><><><><><><><><><><><><><><> <><><><><><><><><><><><><><><><><><><><><><><><><><><><><><><><><>  -  A1c - borderline elevated blood sugar , So   - Avoid Sweets, Candy & White Stuff   - White Rice, White [place], White Flour  - Breads &  Pasta <><><><><><><><><><><><><><><><><><><><><><><><><><><><><><><><><> <><><><><><><><><><><><><><><><><><><><><><><><><><><><><><><><><>  -  Vitamin D = 67 - Excellent  - Please continue dose same  <><><><><><><><><><><><><><><><><><><><><><><><><><><><><><><><><> <><><><><><><><><><><><><><><><><><><><><><><><><><><><><><><><><>  -  All Else - CBC - Kidneys - Electrolytes - Liver - Magnesium & Thyroid    - all  Normal / OK  <><><><><><><><><><><><><><><><><><><><><><><><><><><><><><><><><> <><><><><><><><><><><><><><><><><><><><><><><><><><><><><><><><><>

## 2022-05-01 ENCOUNTER — Encounter: Payer: Self-pay | Admitting: Internal Medicine

## 2022-05-11 ENCOUNTER — Ambulatory Visit: Payer: Medicare Other | Attending: Cardiovascular Disease | Admitting: *Deleted

## 2022-05-11 DIAGNOSIS — I482 Chronic atrial fibrillation, unspecified: Secondary | ICD-10-CM

## 2022-05-11 DIAGNOSIS — Z7901 Long term (current) use of anticoagulants: Secondary | ICD-10-CM

## 2022-05-11 LAB — POCT INR: INR: 3.9 — AB (ref 2.0–3.0)

## 2022-05-11 NOTE — Patient Instructions (Addendum)
Description   Do not take any warfarin tonight then start taking 1 tablets daily except 1.5 tablets Monday, Wednesday, and Friday. Have some leafy veggies today and remain consistent. Repeat INR in 3 weeks. Call 336 [phone] with any questions;

## 2022-05-25 ENCOUNTER — Ambulatory Visit: Payer: Medicare Other | Attending: Cardiovascular Disease

## 2022-05-25 DIAGNOSIS — I495 Sick sinus syndrome: Secondary | ICD-10-CM

## 2022-05-25 LAB — CUP PACEART REMOTE DEVICE CHECK
Battery Remaining Longevity: 154 mo
Battery Voltage: 3.15 V
Brady Statistic AP VP Percent: 0 %
Brady Statistic AP VS Percent: 0 %
Brady Statistic AS VP Percent: 92.94 %
Brady Statistic AS VS Percent: 7.06 %
Brady Statistic RA Percent Paced: 0 %
Brady Statistic RV Percent Paced: 92.94 %
Date Time Interrogation Session: 20240122203626
Implantable Lead Connection Status: 753985
Implantable Lead Connection Status: 753985
Implantable Lead Implant Date: 20110428
Implantable Lead Implant Date: 20110428
Implantable Lead Location: 753859
Implantable Lead Location: 753860
Implantable Lead Model: 4092
Implantable Lead Model: 4592
Implantable Pulse Generator Implant Date: 20230424
Lead Channel Impedance Value: 456 Ohm
Lead Channel Impedance Value: 494 Ohm
Lead Channel Impedance Value: 551 Ohm
Lead Channel Impedance Value: 589 Ohm
Lead Channel Pacing Threshold Amplitude: 0.875 V
Lead Channel Pacing Threshold Pulse Width: 0.4 ms
Lead Channel Sensing Intrinsic Amplitude: 1.375 mV
Lead Channel Sensing Intrinsic Amplitude: 7.75 mV
Lead Channel Sensing Intrinsic Amplitude: 7.75 mV
Lead Channel Setting Pacing Amplitude: 2 V
Lead Channel Setting Pacing Pulse Width: 0.4 ms
Lead Channel Setting Sensing Sensitivity: 1.2 mV
Zone Setting Status: 755011

## 2022-05-30 ENCOUNTER — Other Ambulatory Visit: Payer: Self-pay | Admitting: Nurse Practitioner

## 2022-06-01 ENCOUNTER — Ambulatory Visit: Payer: Medicare Other | Attending: Cardiology

## 2022-06-01 DIAGNOSIS — Z7901 Long term (current) use of anticoagulants: Secondary | ICD-10-CM

## 2022-06-01 DIAGNOSIS — Z5181 Encounter for therapeutic drug level monitoring: Secondary | ICD-10-CM | POA: Diagnosis not present

## 2022-06-01 DIAGNOSIS — I482 Chronic atrial fibrillation, unspecified: Secondary | ICD-10-CM

## 2022-06-01 LAB — POCT INR: INR: 3.4 — AB (ref 2.0–3.0)

## 2022-06-01 NOTE — Patient Instructions (Signed)
HOLD TONIGHT ONLY THEN CONTINUE taking 1 tablets daily except 1.5 tablets Monday, Wednesday, and Friday. Have some leafy veggies today and remain consistent. Repeat INR in 3 weeks. Call 336 [phone] with any questions;

## 2022-06-22 ENCOUNTER — Ambulatory Visit: Payer: Medicare Other | Attending: Cardiology

## 2022-06-22 DIAGNOSIS — I482 Chronic atrial fibrillation, unspecified: Secondary | ICD-10-CM

## 2022-06-22 DIAGNOSIS — Z7901 Long term (current) use of anticoagulants: Secondary | ICD-10-CM | POA: Diagnosis not present

## 2022-06-22 LAB — POCT INR: INR: 2.3 (ref 2.0–3.0)

## 2022-06-22 NOTE — Progress Notes (Signed)
Remote pacemaker transmission.   

## 2022-06-22 NOTE — Patient Instructions (Signed)
Description   Continue taking Warfarin 1 tablet daily except 1.5 tablets Mondays, Wednesdays, and Fridays. Have some leafy veggies today and remain consistent. Repeat INR in 4 weeks. Call [phone] with any questions;

## 2022-06-23 NOTE — Progress Notes (Signed)
Patient Care Team: [name], MD as PCP - General (Internal Medicine) [name], [name], MD as PCP - Cardiology (Cardiology) [name], MD (Inactive) as Consulting Physician (Urology) Penni Bombard, MD as Consulting Physician (Neurology) [name], MD as Consulting Physician (Neurology) [name], [name], MD as Consulting Physician (Cardiology) [name], MD (Inactive) as Consulting Physician (Gastroenterology) [name], RN as Oncology Nurse Navigator [name], RN as Oncology Nurse Navigator  DIAGNOSIS:  Encounter Diagnosis  Name Primary?   Malignant neoplasm of upper-outer quadrant of right breast in female, estrogen receptor positive ([place]) Yes    SUMMARY OF ONCOLOGIC HISTORY: Oncology History  Malignant neoplasm of upper-outer quadrant of right breast in female, estrogen receptor positive ([place])  11/15/2019 Cancer Staging   Staging form: Breast, AJCC 8th Edition - Clinical stage from 11/15/2019: Stage IA (cT1c, cN0, cM0, G2, ER+, PR+, HER2-) - Signed by [name], NP on 11/21/2019   11/15/2019 Initial Biopsy   Palpable right breast mass: 1.5 cm at 12 o'clock position, benign intramammary lymph node, axilla negative, right breast biopsy 1 grade 2 ER 80%, PR 50%, Ki-67 5%, HER-2 negative   12/13/2019 Surgery   Right lumpectomy ([facility]): invasive lobular carcinoma, grade 2, 2.4cm, clear margins.      CHIEF COMPLIANT:  Follow-up on anastrozole  INTERVAL HISTORY: Kemarie Biggar is a 89 y.o. with above-mentioned history of right breast cancer who underwent a right lumpectomy and is currently on antiestrogen therapy with anastrozole. She presents to the clinic today for follow-up.  She reports that she is tolerating the anastrozole extremely well. She says she has been traveling with the church family. She also has been doing he exercise by riding a  exercise bike at home. She denies any pain or discomfort in  breast.  ALLERGIES:  is allergic to latex, ace inhibitors, augmentin [amoxicillin-pot clavulanate], ciprofloxacin, levaquin [levofloxacin in d5w], zocor [simvastatin], acrylic polymer [carbomer], and chocolate.  MEDICATIONS:  Current Outpatient Medications  Medication Sig Dispense Refill   acidophilus (RISAQUAD) CAPS capsule Take 1 capsule by mouth daily.     anastrozole (ARIMIDEX) 1 MG tablet Take 1 tablet (1 mg total) by mouth daily. TAKE 1 TABLET(1 MG) BY MOUTH DAILY Strength: 1 mg 90 tablet 3   Ascorbic Acid (VITAMIN C) 1000 MG tablet Take 1,000 mg by mouth daily.      Cholecalciferol (VITAMIN D-3) 125 MCG (5000 UT) TABS Take 5,000 Units by mouth daily.     Cyanocobalamin (VITAMIN B 12 PO) Take 1,000 mcg by mouth daily.      diltiazem (CARDIZEM CD) 300 MG 24 hr capsule Take 1 capsule (300 mg total) by mouth daily. 90 capsule 3   escitalopram (LEXAPRO) 10 MG tablet Take 1 tablet Daily for Chronic Anxiety &  Mood 90 tablet 3   Ferrous Sulfate 90 (18 Fe) MG TABS Take 18 mg by mouth daily with breakfast.     gabapentin (NEURONTIN) 100 MG capsule Take  1 to 2 capsules  1 to 2 hours  before Bedtime  for Diabetic Neuropathy Pain & Sleep 180 capsule 3   irbesartan (AVAPRO) 300 MG tablet Take  1 tablet  Daily  for BP & Heart 90 tablet 3   Magnesium 250 MG TABS Take 250 mg by mouth daily.     OVER THE COUNTER MEDICATION Apply 1 application. topically See admin instructions. Neuropathy maximum strength Nerve relief and recovery cream- Apply to the feet at bedtime     potassium chloride SA (  KLOR-CON M) 20 MEQ tablet Take  1 tablet  2 x /day  for Potassium                                                                       /                                             TAKE                                         BY                                                    MOUTH 180 tablet 3   warfarin (COUMADIN) 3 MG tablet TAKE 1 TO 1.5 TABLETS BY MOUTH EVERY DAY AS DIRECTED 135 tablet 1   zinc gluconate 50  MG tablet Take 50 mg by mouth daily.     No current facility-administered medications for this visit.    PHYSICAL EXAMINATION: ECOG PERFORMANCE STATUS: 1 - Symptomatic but completely ambulatory  Vitals:   06/28/22 1001  BP: (!) 174/60  Pulse: 77  Resp: 15  Temp: 97.9 F (36.6 C)  SpO2: 96%   Filed Weights   06/28/22 1001  Weight: 153 lb 11.2 oz (69.7 kg)      LABORATORY DATA:  I have reviewed the data as listed    Latest Ref Rng & Units 04/29/2022    2:57 PM 01/26/2022   12:08 PM 11/23/2021   10:38 AM  CMP  Glucose 65 - 99 mg/dL 89  87  81   BUN 7 - 25 mg/dL '26  28  18   '$ Creatinine 0.60 - 0.95 mg/dL 0.78  0.89  0.80   Sodium 135 - 146 mmol/L 143  140  141   Potassium 3.5 - 5.3 mmol/L 4.3  4.7  4.5   Chloride 98 - 110 mmol/L 103  102  103   CO2 20 - 32 mmol/L '30  28  28   '$ Calcium 8.6 - 10.4 mg/dL 9.6  9.7  9.5   Total Protein 6.1 - 8.1 g/dL 7.1  7.4  7.0   Total Bilirubin 0.2 - 1.2 mg/dL 0.6  0.6  0.5   AST 10 - 35 U/L '16  17  11   '$ ALT 6 - 29 U/L '12  11  10     '$ Lab Results  Component Value Date   WBC 7.3 04/29/2022   HGB 13.1 04/29/2022   HCT 38.6 04/29/2022   MCV 91.9 04/29/2022   PLT 236 04/29/2022   NEUTROABS 4,037 04/29/2022    ASSESSMENT & PLAN:  Malignant neoplasm of upper-outer quadrant of right breast in female, estrogen receptor positive ([place]) 12/13/19: Right lumpectomy ([facility]): invasive lobular carcinoma, grade 2, 2.4cm, clear margins.  ER 80%, PR 50%, HER-2 negative, Ki-67 5%   Treatment  plan: Anastrozole adjuvant therapy 1 mg daily x5 years started September 2021 Anastrozole toxicities: Hot flashes have resolved. Otherwise tolerating it extremely well.    Breast cancer surveillance: Mammogram  12/09/2021.  Palpable area of concern in the right breast: No ultrasound or mammographic correlates Benign density Cat B   She likes to travel around with her church group to hear [place] speakers. Chronic peripheral neuropathy: Improving.  She is now  using a cane to get around.   Return to clinic in 1 year for follow-up    No orders of the defined types were placed in this encounter.  The patient has a good understanding of the overall plan. she agrees with it. she will call with any problems that may develop before the next visit here. Total time spent: 30 mins including face to face time and time spent for planning, charting and co-ordination of care   Harriette Ohara, MD 06/28/22    I Gardiner Coins am acting as a Education administrator for [employer]  I have reviewed the above documentation for accuracy and completeness, and I agree with the above.

## 2022-06-28 ENCOUNTER — Inpatient Hospital Stay: Payer: Medicare Other | Attending: Hematology and Oncology | Admitting: Hematology and Oncology

## 2022-06-28 VITALS — BP 174/60 | HR 77 | Temp 97.9°F | Resp 15 | Wt 153.7 lb

## 2022-06-28 DIAGNOSIS — C50411 Malignant neoplasm of upper-outer quadrant of right female breast: Secondary | ICD-10-CM | POA: Insufficient documentation

## 2022-06-28 DIAGNOSIS — G629 Polyneuropathy, unspecified: Secondary | ICD-10-CM | POA: Insufficient documentation

## 2022-06-28 DIAGNOSIS — Z170 Estrogen receptor positive status [ER+]: Secondary | ICD-10-CM | POA: Diagnosis not present

## 2022-06-28 DIAGNOSIS — Z79811 Long term (current) use of aromatase inhibitors: Secondary | ICD-10-CM | POA: Insufficient documentation

## 2022-06-28 MED ORDER — ANASTROZOLE 1 MG PO TABS: 1.0000 mg | ORAL_TABLET | Freq: Every day | ORAL | 3 refills | Status: DC

## 2022-06-28 NOTE — Assessment & Plan Note (Signed)
12/13/19: Right lumpectomy ([facility]): invasive lobular carcinoma, grade 2, 2.4cm, clear margins.  ER 80%, PR 50%, HER-2 negative, Ki-67 5%   Treatment plan: Anastrozole adjuvant therapy 1 mg daily x5 years started September 2021 Anastrozole toxicities: Hot flashes have resolved. Otherwise tolerating it extremely well.    Breast cancer surveillance: 1.  Breast exam 06/28/2022: Benign (she did not want to be examined today) 2. Mammogram  12/09/2021.  Palpable area of concern in the right breast: No ultrasound or mammographic correlates Benign density Cat B   She likes to travel around with her church group to hear [place] speakers. Chronic peripheral neuropathy: Improving.  She is now using a cane to get around.   Return to clinic in 1 year for follow-up

## 2022-07-20 ENCOUNTER — Ambulatory Visit: Payer: Medicare Other | Attending: Cardiovascular Disease | Admitting: *Deleted

## 2022-07-20 DIAGNOSIS — I482 Chronic atrial fibrillation, unspecified: Secondary | ICD-10-CM | POA: Diagnosis not present

## 2022-07-20 DIAGNOSIS — Z7901 Long term (current) use of anticoagulants: Secondary | ICD-10-CM

## 2022-07-20 LAB — POCT INR: INR: 2.1 (ref 2.0–3.0)

## 2022-07-20 NOTE — Patient Instructions (Addendum)
Description   Continue taking Warfarin 1 tablet daily except 1.5 tablets Mondays, Wednesdays, and Fridays. Remain consistent with green leafy veggies. Repeat INR in 5 weeks. Call [phone] with any questions;

## 2022-07-29 ENCOUNTER — Other Ambulatory Visit: Payer: Self-pay | Admitting: Hematology and Oncology

## 2022-08-05 ENCOUNTER — Other Ambulatory Visit: Payer: Self-pay | Admitting: Internal Medicine

## 2022-08-05 MED ORDER — FUROSEMIDE 40 MG PO TABS: ORAL_TABLET | ORAL | 3 refills | Status: DC

## 2022-08-20 ENCOUNTER — Encounter: Payer: Self-pay | Admitting: Nurse Practitioner

## 2022-08-20 ENCOUNTER — Ambulatory Visit (INDEPENDENT_AMBULATORY_CARE_PROVIDER_SITE_OTHER): Payer: Medicare Other | Admitting: Nurse Practitioner

## 2022-08-20 VITALS — BP 140/70 | HR 93 | Temp 97.9°F | Resp 16 | Ht 64.5 in | Wt 149.4 lb

## 2022-08-20 DIAGNOSIS — Z7901 Long term (current) use of anticoagulants: Secondary | ICD-10-CM

## 2022-08-20 DIAGNOSIS — M1990 Unspecified osteoarthritis, unspecified site: Secondary | ICD-10-CM

## 2022-08-20 DIAGNOSIS — F419 Anxiety disorder, unspecified: Secondary | ICD-10-CM

## 2022-08-20 DIAGNOSIS — R7309 Other abnormal glucose: Secondary | ICD-10-CM

## 2022-08-20 DIAGNOSIS — I700 Atherosclerosis of aorta: Secondary | ICD-10-CM

## 2022-08-20 DIAGNOSIS — C50911 Malignant neoplasm of unspecified site of right female breast: Secondary | ICD-10-CM

## 2022-08-20 DIAGNOSIS — G4709 Other insomnia: Secondary | ICD-10-CM

## 2022-08-20 DIAGNOSIS — K219 Gastro-esophageal reflux disease without esophagitis: Secondary | ICD-10-CM

## 2022-08-20 DIAGNOSIS — Z79899 Other long term (current) drug therapy: Secondary | ICD-10-CM

## 2022-08-20 DIAGNOSIS — I495 Sick sinus syndrome: Secondary | ICD-10-CM | POA: Diagnosis not present

## 2022-08-20 DIAGNOSIS — R2681 Unsteadiness on feet: Secondary | ICD-10-CM

## 2022-08-20 DIAGNOSIS — I482 Chronic atrial fibrillation, unspecified: Secondary | ICD-10-CM

## 2022-08-20 DIAGNOSIS — Z8673 Personal history of transient ischemic attack (TIA), and cerebral infarction without residual deficits: Secondary | ICD-10-CM

## 2022-08-20 DIAGNOSIS — R6889 Other general symptoms and signs: Secondary | ICD-10-CM

## 2022-08-20 DIAGNOSIS — Z0001 Encounter for general adult medical examination with abnormal findings: Secondary | ICD-10-CM | POA: Diagnosis not present

## 2022-08-20 DIAGNOSIS — E782 Mixed hyperlipidemia: Secondary | ICD-10-CM

## 2022-08-20 DIAGNOSIS — E559 Vitamin D deficiency, unspecified: Secondary | ICD-10-CM

## 2022-08-20 DIAGNOSIS — I5032 Chronic diastolic (congestive) heart failure: Secondary | ICD-10-CM

## 2022-08-20 DIAGNOSIS — I10 Essential (primary) hypertension: Secondary | ICD-10-CM

## 2022-08-20 DIAGNOSIS — F3341 Major depressive disorder, recurrent, in partial remission: Secondary | ICD-10-CM

## 2022-08-20 NOTE — Patient Instructions (Signed)

## 2022-08-20 NOTE — Progress Notes (Signed)
Marland Kitchen MEDICARE ANNUAL WELLNESS VISIT AND FOLLOW UP  Assessment:   Encounter for Medicare annual wellness exam Due annually Health maintenance reviewed  Essential hypertension Continue Amlodipine, Diltiazem, Furosemide, Irbesartan Follows with Cardiology, Dr. Royann Shivers Discussed DASH (Dietary Approaches to Stop Hypertension) DASH diet is lower in sodium than a typical American diet. Cut back on foods that are high in saturated fat, cholesterol, and trans fats. Eat more whole-grain foods, fish, poultry, and nuts Remain active and exercise as tolerated daily.  Monitor BP at home-Call if greater than 130/80.  Check CMP/CBC  Atherosclerosis of native coronary artery of native heart without angina pectoris Discussed lifestyle modifications. Recommended diet heavy in fruits and veggies, omega 3's. Decrease consumption of animal meats, cheeses, and dairy products. Remain active and exercise as tolerated. Continue to monitor. Check lipids/TSH  Chronic diastolic heart failure (HCC) Continue furosemide Monitor BLE edema; elevated feet, compression stockings Limit salt intake Cardiology following  SSS (sick sinus syndrome) (HCC)/Pacemaker Cardiology following Continue to monitor  Chronic atrial fibrillation (HCC)/Long term anticoagulation Continue Coumadin - managed by coumadin clinic Monitor PT/INR  Gastroesophageal reflux disease without esophagitis No suspected reflux complications (Barret/stricture). Lifestyle modification:  wt loss, avoid meals 2-3h before bedtime. Consider eliminating food triggers:  chocolate, caffeine, EtOH, acid/spicy food.  Osteoarthritis/spinal stenosis Pursue a combination of weight-bearing exercises and strength training. Advised on fall prevention measures including proper lighting in all rooms, removal of area rugs and floor clutter, use of walking devices as deemed appropriate, avoidance of uneven walking surfaces. Consume 800 to 1000 IU of vitamin D  daily with a goal vitamin D serum value of 30 ng/mL or higher. Aim for 1000 to 1200 mg of elemental calcium daily through supplements and/or dietary sources.  Hyperlipidemia, mixed Discussed lifestyle modifications. Recommended diet heavy in fruits and veggies, omega 3's. Decrease consumption of animal meats, cheeses, and dairy products. Remain active and exercise as tolerated. Continue to monitor. Check lipids/TSH  Vitamin D deficiency Continue supplement Check and monitor levels  History of TIA (transient ischemic attack) Continue to monitor  Abnormal glucose Education: Reviewed 'ABCs' of diabetes management  Discussed goals to be met and/or maintained include A1C (<7) Blood pressure (<130/80) Cholesterol (LDL <70) Continue Eye Exam yearly  Continue Dental Exam Q6 mo Discussed dietary recommendations Discussed Physical Activity recommendations Check A1C  Anxiety/Depression Reviewed relaxation techniques.  Sleep hygiene. Recommended mindfulness meditation and exercise.   Psychoeducation:  encouraged personality growth wand development through coping techniques and problem-solving skills.  Insomnia, unspecified type Discussed good sleep hygiene. Establish bed and wake times. Sleep restriction-only sleep estimated hrs sleep. Bed only for sex and sleep, only sleep when sleepy, out of bed if anxious (stimulus control). Reviewed relaxation techniques, mindful meditations. Expected sleep duration. Addressed worries about not sleeping.   Invasive ductal carcinoma of breast, female, right [facility]) Cancer staging 11/15/2019 Right lumpectomy 12/13/2019 Follows with Oncology, Dr. Pamelia Hoit  Unstable gait Continue with walker. Wear good fitting shoes Monitor surroundings to help prevent falls.  Medication management All medications discussed and reviewed in full. All questions and concerns regarding medications addressed.    Orders Placed This Encounter  Procedures   CBC with  Differential/Platelet   COMPLETE METABOLIC PANEL WITH GFR   Lipid panel   Hemoglobin A1c    Notify office for further evaluation and treatment, questions or concerns if any reported s/s fail to improve.   The patient was advised to call back or seek an in-person evaluation if any symptoms worsen or if the condition fails  to improve as anticipated.   Further disposition pending results of labs. Discussed med's effects and SE's.    I discussed the assessment and treatment plan with the patient. The patient was provided an opportunity to ask questions and all were answered. The patient agreed with the plan and demonstrated an understanding of the instructions.  Discussed med's effects and SE's. Screening labs and tests as requested with regular follow-up as recommended.  I provided 40 minutes of face-to-face time during this encounter including counseling, chart review, and critical decision making was preformed.  Today's Plan of Care is based on a patient-centered health care approach known as shared decision making - the decisions, tests and treatments allow for patient preferences and values to be balanced with clinical evidence.    Future Appointments  Date Time Provider Department Center  08/24/2022  7:05 AM [facility] DEVICE REMOTES [facility]  08/24/2022  9:30 AM [facility] None  11/23/2022  7:05 AM [facility] DEVICE REMOTES [facility]  02/21/2023  8:45 AM Serena Croissant, MD [facility] None  05/02/2023  3:00 PM Lucky Cowboy, MD [facility] None  07/04/2023 10:15 AM Serena Croissant, MD [facility] None     Plan:   During the course of the visit the patient was educated and counseled about appropriate screening and preventive services including:   Pneumococcal vaccine  Prevnar 13 Influenza vaccine Td vaccine Screening electrocardiogram Bone densitometry screening Colorectal cancer screening Diabetes screening Glaucoma  screening Nutrition counseling  Advanced directives: requested   Subjective:  Ileta Ofarrell is a 89 y.o. female who presents for Medicare Annual Wellness Visit and 1 month follow up. She has Hyperlipidemia, mixed; Essential hypertension; Coronary atherosclerosis by Chest CT on 10/02/2021; GERD; FIBROCYSTIC BREAST DISEASE; Osteoarthritis; Long term current use of anticoagulant therapy; Pacemaker; Vitamin D deficiency; Chronic diastolic heart failure; History of TIA (transient ischemic attack); Chronic atrial fibrillation (HCC); Neural foraminal stenosis of cervical spine; Abnormal glucose; SSS (sick sinus syndrome); Anxiety; Insomnia; Recurrent major 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; Malignant neoplasm of upper-outer quadrant of right breast in female, estrogen receptor positive; Invasive ductal carcinoma of breast, female, right; Aortic atherosclerosis (HCC) by Chest CT 0n 10/02/2021; Pacemaker battery depletion; Longstanding persistent atrial fibrillation; Syncope; Scalp hematoma; Forehead laceration; Back pain; At high risk for injury related to fall; and Unstable gait on their problem list.  Overall she reports doing well today.  She continues to live independently and take care of her own househould chores.She has two friends around her who help her out.  She also has a niece who is active in caring for her.  She had a syncopal episode with a fall and visit to the ED 10/02/21 which is believed to be due to pacemaker malfunction, low battery.  She was noted to have 3 laceration on the left side of forehead with active bleeding and multiple scattered bruises.  She has a hx of afib, and SSS.  She has a pacemaker.  She is on coumadin.  She has chronic diastolic heart failure.  She follows with Dr. Royann Shivers, Cardiology.  Last seen 11/30/21.  She had a pacemaker check, 3 months after generateor change out.  She has a Research officer, political party, but ventricular lead is old 58 lead from 2011 so system is not MRI  conditional.  Pacemaker was checked an noted that device function is normal with estimated generator longevity of 13.2 years.  Lead parameters are all in nominal range and unchanged from the previous visit.  She has 82% ventricular pacing, she  is not pacemaker dependent.  The heart rate histogram distribution appears appropriate.  She had a single 6 beat run of high ventricular rates that could be nonsustained ventricular tachycardia.  An echocardiogram performed while she was hospitalized for heart failure exacerbation 2022 showed normal left ventricular systolic function and was interpreted as showing pseudo normal filling (diastolic function cannot be evaluated in this patient in atrial fibrillation).  No major valve problems were identified.  BMI is Body mass index is 25.25 kg/m., she has not been working on diet and exercise. Wt Readings from Last 3 Encounters:  08/20/22 149 lb 6.4 oz (67.8 kg)  06/28/22 153 lb 11.2 oz (69.7 kg)  04/29/22 151 lb 12.8 oz (68.9 kg)    Her blood pressure has been controlled at home, today their BP is BP: (!) 140/70 She does not workout. She denies chest pain, shortness of breath, dizziness.  She is not on cholesterol medication and denies myalgias. Her cholesterol is not at goal with elevated triglycerides. The cholesterol last visit was:   Lab Results  Component Value Date   CHOL 170 04/29/2022   HDL 43 (L) 04/29/2022   LDLCALC 103 (H) 04/29/2022   TRIG 141 04/29/2022   CHOLHDL 4.0 04/29/2022   She has not been working on diet and exercise for treatmet of prediabetes, and denies polydipsia and polyuria. Last A1C in the office was:  Lab Results  Component Value Date   HGBA1C 5.7 (H) 04/29/2022   Last GFR: Lab Results  Component Value Date   EGFR 71 04/29/2022   Patient is on Vitamin D supplement.   Lab Results  Component Value Date   VD25OH 67 04/29/2022      Medication Review: Current Outpatient Medications on File Prior to Visit   Medication Sig Dispense Refill   acidophilus (RISAQUAD) CAPS capsule Take 1 capsule by mouth daily.     anastrozole (ARIMIDEX) 1 MG tablet TAKE 1 TABLET(1 MG) BY MOUTH DAILY 90 tablet 3   Ascorbic Acid (VITAMIN C) 1000 MG tablet Take 1,000 mg by mouth daily.      Cholecalciferol (VITAMIN D-3) 125 MCG (5000 UT) TABS Take 5,000 Units by mouth daily.     Cyanocobalamin (VITAMIN B 12 PO) Take 1,000 mcg by mouth daily.      diltiazem (CARDIZEM CD) 300 MG 24 hr capsule Take 1 capsule (300 mg total) by mouth daily. 90 capsule 3   escitalopram (LEXAPRO) 10 MG tablet Take 1 tablet Daily for Chronic Anxiety &  Mood 90 tablet 3   Ferrous Sulfate 90 (18 Fe) MG TABS Take 18 mg by mouth daily with breakfast.     furosemide (LASIX) 40 MG tablet Take 1 tablet  2 x /day for BP & Fluid Retention /Ankle Swelling 180 tablet 3   gabapentin (NEURONTIN) 100 MG capsule Take  1 to 2 capsules  1 to 2 hours  before Bedtime  for Diabetic Neuropathy Pain & Sleep 180 capsule 3   irbesartan (AVAPRO) 300 MG tablet Take  1 tablet  Daily  for BP & Heart 90 tablet 3   Magnesium 250 MG TABS Take 250 mg by mouth daily.     OVER THE COUNTER MEDICATION Apply 1 application. topically See admin instructions. Neuropathy maximum strength Nerve relief and recovery cream- Apply to the feet at bedtime     potassium chloride SA (KLOR-CON M) 20 MEQ tablet Take  1 tablet  2 x /day  for Potassium                                                                       /  TAKE                                         BY                                                    MOUTH 180 tablet 3   warfarin (COUMADIN) 3 MG tablet TAKE 1 TO 1.5 TABLETS BY MOUTH EVERY DAY AS DIRECTED 135 tablet 1   zinc gluconate 50 MG tablet Take 50 mg by mouth daily.     No current facility-administered medications on file prior to visit.    Allergies  Allergen Reactions   Latex Itching   Ace Inhibitors Other (See Comments)     Unknown reaction   Augmentin [Amoxicillin-Pot Clavulanate] Other (See Comments)    Unknown   Ciprofloxacin Other (See Comments)    Unknown    Levaquin [Levofloxacin In D5w] Other (See Comments)    Unknown   Zocor [Simvastatin] Other (See Comments)    Unknown    Acrylic Polymer [Carbomer] Itching   Chocolate Other (See Comments)    Migraines     Current Problems (verified) Patient Active Problem List   Diagnosis Date Noted   At high risk for injury related to fall 11/23/2021   Unstable gait 11/23/2021   Scalp hematoma 10/05/2021   Forehead laceration 10/05/2021   Back pain 10/05/2021   Syncope 10/02/2021   Pacemaker battery depletion 08/24/2021   Longstanding persistent atrial fibrillation    Aortic atherosclerosis (HCC) by Chest CT 0n 10/02/2021 01/12/2021   Invasive ductal carcinoma of breast, female, right 12/13/2019   Malignant neoplasm of upper-outer quadrant of right breast in female, estrogen receptor positive 11/21/2019   Anxiety 06/18/2019   Insomnia 06/18/2019   Recurrent major 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 06/18/2019   SSS (sick sinus syndrome) 09/26/2018   Abnormal glucose 08/29/2018   Neural foraminal stenosis of cervical spine 04/18/2018   History of TIA (transient ischemic attack) 04/17/2018   Chronic atrial fibrillation (HCC)    Chronic diastolic heart failure 11/10/2017   Vitamin D deficiency 08/15/2013   Pacemaker 09/19/2012   Long term current use of anticoagulant therapy 07/18/2012   Hyperlipidemia, mixed 09/03/2008   Essential hypertension 09/03/2008   Coronary atherosclerosis by Chest CT on 10/02/2021 09/03/2008   GERD 09/03/2008   FIBROCYSTIC BREAST DISEASE 09/03/2008   Osteoarthritis 09/03/2008    Screening Tests Immunization History  Administered Date(s) Administered   DT (Pediatric) 03/19/2015   Influenza Split 05/04/2011   Influenza, High Dose Seasonal PF 03/19/2014, 12/23/2015, 02/03/2017, 12/26/2018, 01/28/2020    Influenza,inj,quad, With Preservative 05/21/2013   Influenza-Unspecified 02/04/2015, 02/03/2017, 01/28/2020   PFIZER(Purple Top)SARS-COV-2 Vaccination 06/16/2019, 07/09/2019, 03/21/2020   Pneumococcal Conjugate-13 03/19/2014   Pneumococcal Polysaccharide-23 05/04/2011   Pneumococcal-Unspecified 05/03/2001   Td 05/04/2003   Tdap 10/02/2021   Health Maintenance  Topic Date Due   Zoster Vaccines- Shingrix (1 of 2) Never done   DEXA SCAN  Never done   COVID-19 Vaccine (4 - 2023-24 season) 01/01/2022   INFLUENZA VACCINE  12/02/2022   Medicare Annual Wellness (AWV)  01/27/2023   DTaP/Tdap/Td (4 - Td or Tdap) 10/03/2031   Pneumonia Vaccine 92+ Years old  Completed   HPV VACCINES  Aged Out  Last colonoscopy: 09/10/2008 aged out Mammograms: 12/09/2021 yearly check ups Last pap smear/pelvic exam: aged out. DEXA: defers   Names of Other Physician/Practitioners you currently use: 1. [facility] here for primary care 2. Eye doctor, last visit Sees yearly - just received new Rx 3. Dentist, last visit Follows Q6 mo  Patient Care Team: Lucky Cowboy, MD as PCP - General (Internal Medicine) [name], Rachelle Hora, MD as PCP - Cardiology (Cardiology) Barron Alvine, MD (Inactive) as Consulting Physician (Urology) Suanne Marker, MD as Consulting Physician (Neurology) Micki Riley, MD as Consulting Physician (Neurology) [name], Rachelle Hora, MD as Consulting Physician (Cardiology) Louis Meckel, MD (Inactive) as Consulting Physician (Gastroenterology) Pershing Proud, RN as Oncology Nurse Navigator Donnelly Angelica, RN as Oncology Nurse Navigator  SURGICAL HISTORY She  has a past surgical history that includes Cardioversion (11/19/2009); Cardiac catheterization (08/06/2005); Cholecystectomy; Appendectomy; Abdominal hysterectomy; Skin cancer resection; Tonsillectomy (1938); Breast surgery (Left, 1949); Eye surgery (Bilateral, 2013); Breast lumpectomy with  radioactive seed localization (Right, 12/13/2019); PPM GENERATOR CHANGEOUT (N/A, 08/24/2021); Breast biopsy (Right, 11/16/2019); and Breast lumpectomy (Right, 12/13/2019). FAMILY HISTORY Her family history includes Cirrhosis in her brother; Diabetes in her mother; Heart attack in her father; Heart disease in her father and mother. SOCIAL HISTORY She  reports that she has never smoked. She has never used smokeless tobacco. She reports that she does not drink alcohol and does not use drugs.   MEDICARE WELLNESS OBJECTIVES: Physical activity:   Cardiac risk factors:   Depression/mood screen:      08/20/2022    9:44 AM  Depression screen PHQ 2/9  Decreased Interest 0  Down, Depressed, Hopeless 0  PHQ - 2 Score 0    ADLs:     08/20/2022    9:43 AM 05/01/2022    7:22 PM  In your present state of health, do you have any difficulty performing the following activities:  Hearing? 1 0  Comment hearing aides intact   Vision? 0 0  Difficulty concentrating or making decisions? 0 0  Walking or climbing stairs? 0 0  Dressing or bathing? 0 0  Doing errands, shopping? 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 N   Using the Toilet? Y   Comment urgency   In the past six months, have you accidently leaked urine? Y   Comment uses pads   Do you have problems with loss of bowel control? N   Managing your Medications? N   Managing your Finances? N   Housekeeping or managing your Housekeeping? N      Cognitive Testing  Alert? Yes  Normal Appearance?Yes  Oriented to person? Yes  Place? Yes   Time? Yes  Recall of three objects?  Yes  Can perform simple calculations? Yes  Displays appropriate judgment?Yes  Can read the correct time from a watch face?Yes  EOL planning: Does Patient Have a Medical Advance Directive?: Yes Type of Advance Directive: Living will  Review of Systems  Constitutional:  Negative for chills, fever, malaise/fatigue and weight loss.  HENT:  Negative for  congestion, hearing loss, sinus pain, sore throat and tinnitus.   Eyes:  Negative for blurred vision, double vision, discharge and redness.  Respiratory:  Negative for cough, sputum production and shortness of breath.   Cardiovascular:  Negative for chest pain and palpitations.  Gastrointestinal:  Negative for abdominal pain, blood in stool, constipation, diarrhea, heartburn, nausea and vomiting.  Genitourinary:  Negative for dysuria, frequency, hematuria  and urgency.  Musculoskeletal:  Negative for falls and joint pain.  Skin:  Negative for itching and rash.  Neurological:  Negative for dizziness, tremors, sensory change, weakness and headaches.  Endo/Heme/Allergies:  Bruises/bleeds easily (anticoagulant).  Psychiatric/Behavioral:  Negative for depression, hallucinations and memory loss. The patient is not nervous/anxious and does not have insomnia.      Objective:     Today's Vitals   08/20/22 0846  BP: (!) 140/70  Pulse: 93  Resp: 16  Temp: 97.9 F (36.6 C)  SpO2: 97%  Weight: 149 lb 6.4 oz (67.8 kg)  Height: 5' 4.5" (1.638 m)   Body mass index is 25.25 kg/m.  General appearance: alert, no distress, WD/WN, female HEENT: normocephalic, sclerae anicteric, TMs pearly, nares patent, no discharge or erythema, pharynx normal Oral cavity: MMM, no lesions Neck: supple, no lymphadenopathy, no thyromegaly, no masses Heart: RRR, normal S1, S2, no murmurs Lungs: CTA bilaterally, no wheezes, rhonchi, or rales Abdomen: +bs, soft, non tender, non distended, no masses, no hepatomegaly, no splenomegaly Musculoskeletal: nontender, no swelling, no obvious deformity Extremities: no edema, no cyanosis, no clubbing Pulses: 2+ symmetric, upper and lower extremities, normal cap refill Neurological: alert, oriented x 3, CN2-12 intact, strength normal upper extremities and lower extremities, sensation normal throughout, DTRs 2+ throughout, no cerebellar signs, gait normal Psychiatric: normal  affect, behavior normal, pleasant   EKG: 10/2021 [facility]   Medicare Attestation I have personally reviewed: The patient's medical and social history Their use of alcohol, tobacco or illicit drugs Their current medications and supplements The patient's functional ability including ADLs,fall risks, home safety risks, cognitive, and hearing and visual impairment Diet and physical activities Evidence for depression or mood disorders  The patient's weight, height, BMI, and visual acuity have been recorded in the chart.  I have made referrals, counseling, and provided education to the patient based on review of the above and I have provided the patient with a written personalized care plan for preventive services.     Adela Glimpse, NP   08/20/2022

## 2022-08-21 LAB — CBC WITH DIFFERENTIAL/PLATELET
Absolute Monocytes: 866 cells/uL (ref 200–950)
Basophils Absolute: 64 cells/uL (ref 0–200)
Basophils Relative: 0.9 %
Eosinophils Absolute: 43 cells/uL (ref 15–500)
Eosinophils Relative: 0.6 %
HCT: 43 % (ref 35.0–45.0)
Hemoglobin: 13.7 g/dL (ref 11.7–15.5)
Lymphs Abs: 2123 cells/uL (ref 850–3900)
MCH: 29.8 pg (ref 27.0–33.0)
MCHC: 31.9 g/dL — ABNORMAL LOW (ref 32.0–36.0)
MCV: 93.5 fL (ref 80.0–100.0)
MPV: 11.2 fL (ref 7.5–12.5)
Monocytes Relative: 12.2 %
Neutro Abs: 4004 cells/uL (ref 1500–7800)
Neutrophils Relative %: 56.4 %
Platelets: 238 10*3/uL (ref 140–400)
RBC: 4.6 10*6/uL (ref 3.80–5.10)
RDW: 11.8 % (ref 11.0–15.0)
Total Lymphocyte: 29.9 %
WBC: 7.1 10*3/uL (ref 3.8–10.8)

## 2022-08-21 LAB — HEMOGLOBIN A1C
Hgb A1c MFr Bld: 5.9 % of total Hgb — ABNORMAL HIGH (ref <5.7)
Mean Plasma Glucose: 123 mg/dL
eAG (mmol/L): 6.8 mmol/L

## 2022-08-21 LAB — COMPLETE METABOLIC PANEL WITH GFR
AG Ratio: 1.9 (calc) (ref 1.0–2.5)
ALT: 13 U/L (ref 6–29)
AST: 16 U/L (ref 10–35)
Albumin: 4.6 g/dL (ref 3.6–5.1)
Alkaline phosphatase (APISO): 141 U/L (ref 37–153)
BUN/Creatinine Ratio: 25 (calc) — ABNORMAL HIGH (ref 6–22)
BUN: 26 mg/dL — ABNORMAL HIGH (ref 7–25)
CO2: 30 mmol/L (ref 20–32)
Calcium: 9.9 mg/dL (ref 8.6–10.4)
Chloride: 103 mmol/L (ref 98–110)
Creat: 1.05 mg/dL — ABNORMAL HIGH (ref 0.60–0.95)
Globulin: 2.4 g/dL (calc) (ref 1.9–3.7)
Glucose, Bld: 78 mg/dL (ref 65–99)
Potassium: 4 mmol/L (ref 3.5–5.3)
Sodium: 145 mmol/L (ref 135–146)
Total Bilirubin: 0.5 mg/dL (ref 0.2–1.2)
Total Protein: 7 g/dL (ref 6.1–8.1)
eGFR: 50 mL/min/{1.73_m2} — ABNORMAL LOW (ref >=60)

## 2022-08-21 LAB — LIPID PANEL
Cholesterol: 164 mg/dL (ref <200)
HDL: 43 mg/dL — ABNORMAL LOW (ref >=50)
LDL Cholesterol (Calc): 93 mg/dL (calc)
Non-HDL Cholesterol (Calc): 121 mg/dL (calc) (ref <130)
Total CHOL/HDL Ratio: 3.8 (calc) (ref <5.0)
Triglycerides: 184 mg/dL — ABNORMAL HIGH (ref <150)

## 2022-08-24 ENCOUNTER — Ambulatory Visit (INDEPENDENT_AMBULATORY_CARE_PROVIDER_SITE_OTHER): Payer: Medicare Other

## 2022-08-24 ENCOUNTER — Ambulatory Visit: Payer: Medicare Other | Attending: Cardiovascular Disease | Admitting: *Deleted

## 2022-08-24 DIAGNOSIS — I482 Chronic atrial fibrillation, unspecified: Secondary | ICD-10-CM | POA: Diagnosis not present

## 2022-08-24 DIAGNOSIS — I495 Sick sinus syndrome: Secondary | ICD-10-CM

## 2022-08-24 DIAGNOSIS — Z7901 Long term (current) use of anticoagulants: Secondary | ICD-10-CM

## 2022-08-24 LAB — POCT INR: INR: 2.9 (ref 2.0–3.0)

## 2022-08-24 NOTE — Patient Instructions (Signed)
Description   Continue taking Warfarin 1 tablet daily except 1.5 tablets Mondays, Wednesdays, and Fridays. Remain consistent with green leafy veggies. Repeat INR in 6 weeks. Call [phone] with any questions;

## 2022-08-25 LAB — CUP PACEART REMOTE DEVICE CHECK
Battery Remaining Longevity: 152 mo
Battery Voltage: 3.11 V
Brady Statistic AP VP Percent: 0 %
Brady Statistic AP VS Percent: 0 %
Brady Statistic AS VP Percent: 92.92 %
Brady Statistic AS VS Percent: 7.08 %
Brady Statistic RA Percent Paced: 0 %
Brady Statistic RV Percent Paced: 92.92 %
Date Time Interrogation Session: 20240423013148
Implantable Lead Connection Status: 753985
Implantable Lead Connection Status: 753985
Implantable Lead Implant Date: 20110428
Implantable Lead Implant Date: 20110428
Implantable Lead Location: 753859
Implantable Lead Location: 753860
Implantable Lead Model: 4092
Implantable Lead Model: 4592
Implantable Pulse Generator Implant Date: 20230424
Lead Channel Impedance Value: 456 Ohm
Lead Channel Impedance Value: 494 Ohm
Lead Channel Impedance Value: 589 Ohm
Lead Channel Impedance Value: 627 Ohm
Lead Channel Pacing Threshold Amplitude: 0.875 V
Lead Channel Pacing Threshold Pulse Width: 0.4 ms
Lead Channel Sensing Intrinsic Amplitude: 1.375 mV
Lead Channel Sensing Intrinsic Amplitude: 11.875 mV
Lead Channel Sensing Intrinsic Amplitude: 11.875 mV
Lead Channel Setting Pacing Amplitude: 2 V
Lead Channel Setting Pacing Pulse Width: 0.4 ms
Lead Channel Setting Sensing Sensitivity: 1.2 mV
Zone Setting Status: 755011

## 2022-09-21 NOTE — Progress Notes (Signed)
Remote pacemaker transmission.   

## 2022-10-05 ENCOUNTER — Ambulatory Visit: Payer: Medicare Other | Attending: Cardiovascular Disease | Admitting: *Deleted

## 2022-10-05 DIAGNOSIS — Z7901 Long term (current) use of anticoagulants: Secondary | ICD-10-CM | POA: Diagnosis not present

## 2022-10-05 DIAGNOSIS — I482 Chronic atrial fibrillation, unspecified: Secondary | ICD-10-CM | POA: Diagnosis not present

## 2022-10-05 DIAGNOSIS — Z5181 Encounter for therapeutic drug level monitoring: Secondary | ICD-10-CM

## 2022-10-05 LAB — POCT INR: POC INR: 2.7

## 2022-10-05 NOTE — Patient Instructions (Signed)
Description   Continue taking Warfarin 1 tablet daily except 1.5 tablets Mondays, Wednesdays, and Fridays. Remain consistent with green leafy veggies. Repeat INR in 6 weeks. Call [phone] with any questions;      

## 2022-11-12 ENCOUNTER — Other Ambulatory Visit: Payer: Self-pay | Admitting: Hematology and Oncology

## 2022-11-12 DIAGNOSIS — Z9889 Other specified postprocedural states: Secondary | ICD-10-CM

## 2022-11-15 ENCOUNTER — Ambulatory Visit: Payer: Medicare Other | Attending: Cardiovascular Disease

## 2022-11-15 DIAGNOSIS — Z7901 Long term (current) use of anticoagulants: Secondary | ICD-10-CM | POA: Diagnosis not present

## 2022-11-15 DIAGNOSIS — I482 Chronic atrial fibrillation, unspecified: Secondary | ICD-10-CM | POA: Diagnosis not present

## 2022-11-15 LAB — POCT INR: INR: 4.1 — AB (ref 2.0–3.0)

## 2022-11-15 NOTE — Patient Instructions (Signed)
HOLD TONIGHT ONLY THEN Continue taking Warfarin 1 tablet daily except 1.5 tablets Mondays, Wednesdays, and Fridays. Remain consistent with green leafy veggies. Repeat INR in 3 weeks. Call [phone] with any questions;

## 2022-11-16 ENCOUNTER — Ambulatory Visit: Payer: Medicare Other

## 2022-11-22 ENCOUNTER — Ambulatory Visit (INDEPENDENT_AMBULATORY_CARE_PROVIDER_SITE_OTHER): Payer: Medicare Other | Admitting: Nurse Practitioner

## 2022-11-22 ENCOUNTER — Encounter: Payer: Self-pay | Admitting: Nurse Practitioner

## 2022-11-22 VITALS — BP 136/68 | HR 89 | Temp 97.6°F | Ht 64.5 in | Wt 150.0 lb

## 2022-11-22 DIAGNOSIS — E782 Mixed hyperlipidemia: Secondary | ICD-10-CM

## 2022-11-22 DIAGNOSIS — I495 Sick sinus syndrome: Secondary | ICD-10-CM

## 2022-11-22 DIAGNOSIS — I5032 Chronic diastolic (congestive) heart failure: Secondary | ICD-10-CM

## 2022-11-22 DIAGNOSIS — Z950 Presence of cardiac pacemaker: Secondary | ICD-10-CM

## 2022-11-22 DIAGNOSIS — I482 Chronic atrial fibrillation, unspecified: Secondary | ICD-10-CM

## 2022-11-22 DIAGNOSIS — G4709 Other insomnia: Secondary | ICD-10-CM

## 2022-11-22 DIAGNOSIS — K219 Gastro-esophageal reflux disease without esophagitis: Secondary | ICD-10-CM

## 2022-11-22 DIAGNOSIS — I2510 Atherosclerotic heart disease of native coronary artery without angina pectoris: Secondary | ICD-10-CM

## 2022-11-22 DIAGNOSIS — Z79899 Other long term (current) drug therapy: Secondary | ICD-10-CM

## 2022-11-22 DIAGNOSIS — C50911 Malignant neoplasm of unspecified site of right female breast: Secondary | ICD-10-CM

## 2022-11-22 DIAGNOSIS — R7309 Other abnormal glucose: Secondary | ICD-10-CM

## 2022-11-22 DIAGNOSIS — E559 Vitamin D deficiency, unspecified: Secondary | ICD-10-CM

## 2022-11-22 DIAGNOSIS — I10 Essential (primary) hypertension: Secondary | ICD-10-CM | POA: Diagnosis not present

## 2022-11-22 DIAGNOSIS — R2681 Unsteadiness on feet: Secondary | ICD-10-CM

## 2022-11-22 DIAGNOSIS — M1990 Unspecified osteoarthritis, unspecified site: Secondary | ICD-10-CM

## 2022-11-22 DIAGNOSIS — Z8673 Personal history of transient ischemic attack (TIA), and cerebral infarction without residual deficits: Secondary | ICD-10-CM

## 2022-11-22 DIAGNOSIS — F3341 Major depressive disorder, recurrent, in partial remission: Secondary | ICD-10-CM

## 2022-11-22 DIAGNOSIS — F419 Anxiety disorder, unspecified: Secondary | ICD-10-CM

## 2022-11-22 LAB — CBC WITH DIFFERENTIAL/PLATELET
Basophils Absolute: 47 cells/uL (ref 0–200)
Eosinophils Absolute: 79 cells/uL (ref 15–500)
Lymphs Abs: 2560 cells/uL (ref 850–3900)
MCH: 31.1 pg (ref 27.0–33.0)
MCHC: 32.7 g/dL (ref 32.0–36.0)
MCV: 95.2 fL (ref 80.0–100.0)
MPV: 10.8 fL (ref 7.5–12.5)
Monocytes Relative: 12 %
Neutrophils Relative %: 54 %
RBC: 4.21 10*6/uL (ref 3.80–5.10)
RDW: 12.7 % (ref 11.0–15.0)
Total Lymphocyte: 32.4 %
WBC: 7.9 10*3/uL (ref 3.8–10.8)

## 2022-11-22 MED ORDER — BETAMETHASONE DIPROPIONATE 0.05 % EX OINT: TOPICAL_OINTMENT | Freq: Two times a day (BID) | CUTANEOUS | 1 refills | Status: DC

## 2022-11-22 NOTE — Patient Instructions (Signed)

## 2022-11-22 NOTE — Progress Notes (Signed)
FOLLOW UP  Assessment:   Essential hypertension Continue Amlodipine, Diltiazem, Furosemide, Irbesartan Follows with Cardiology, Dr. Royann Shivers Discussed DASH (Dietary Approaches to Stop Hypertension) DASH diet is lower in sodium than a typical American diet. Cut back on foods that are high in saturated fat, cholesterol, and trans fats. Eat more whole-grain foods, fish, poultry, and nuts Remain active and exercise as tolerated daily.  Monitor BP at home-Call if greater than 130/80.  Check CMP/CBC  Atherosclerosis of native coronary artery of native heart without angina pectoris Discussed lifestyle modifications. Recommended diet heavy in fruits and veggies, omega 3's. Decrease consumption of animal meats, cheeses, and dairy products. Remain active and exercise as tolerated. Continue to monitor. Check lipids/TSH  Chronic diastolic heart failure (HCC) Continue furosemide Monitor BLE edema; elevated feet, compression stockings Limit salt intake Cardiology following  SSS (sick sinus syndrome) (HCC)/Pacemaker Cardiology following Continue to monitor  Chronic atrial fibrillation (HCC)/Long term anticoagulation Continue Coumadin - managed by coumadin clinic Monitor PT/INR  Gastroesophageal reflux disease without esophagitis No suspected reflux complications (Barret/stricture). Lifestyle modification:  wt loss, avoid meals 2-3h before bedtime. Consider eliminating food triggers:  chocolate, caffeine, EtOH, acid/spicy food.  Osteoarthritis/spinal stenosis Pursue a combination of weight-bearing exercises and strength training. Advised on fall prevention measures including proper lighting in all rooms, removal of area rugs and floor clutter, use of walking devices as deemed appropriate, avoidance of uneven walking surfaces. Consume 800 to 1000 IU of vitamin D daily with a goal vitamin D serum value of 30 ng/mL or higher. Aim for 1000 to 1200 mg of elemental calcium daily through  supplements and/or dietary sources.  Hyperlipidemia, mixed Discussed lifestyle modifications. Recommended diet heavy in fruits and veggies, omega 3's. Decrease consumption of animal meats, cheeses, and dairy products. Remain active and exercise as tolerated. Continue to monitor. Check lipids/TSH  Vitamin D deficiency Continue supplement Check and monitor levels  History of TIA (transient ischemic attack) Continue to monitor  Abnormal glucose Education: Reviewed 'ABCs' of diabetes management  Discussed goals to be met and/or maintained include A1C (<7) Blood pressure (<130/80) Cholesterol (LDL <70) Continue Eye Exam yearly  Continue Dental Exam Q6 mo Discussed dietary recommendations Discussed Physical Activity recommendations Check A1C  Anxiety/Depression Reviewed relaxation techniques.  Sleep hygiene. Recommended mindfulness meditation and exercise.   Psychoeducation:  encouraged personality growth wand development through coping techniques and problem-solving skills.  Insomnia, unspecified type Discussed good sleep hygiene. Establish bed and wake times. Sleep restriction-only sleep estimated hrs sleep. Bed only for sex and sleep, only sleep when sleepy, out of bed if anxious (stimulus control). Reviewed relaxation techniques, mindful meditations. Expected sleep duration. Addressed worries about not sleeping.   Invasive ductal carcinoma of breast, female, right [facility]) Cancer staging 11/15/2019 Right lumpectomy 12/13/2019 Follows with Oncology, Dr. Pamelia Hoit  Unstable gait Continue with walker. Wear good fitting shoes Monitor surroundings to help prevent falls.  Medication management All medications discussed and reviewed in full. All questions and concerns regarding medications addressed.    Pacemaker Cardiology following - updated apt scheduled for 12/2022  Orders Placed This Encounter  Procedures   CBC with Differential/Platelet   COMPLETE METABOLIC PANEL  WITH GFR   Lipid panel   Hemoglobin A1C w/out eAG   VITAMIN D 25 Hydroxy (Vit-D Deficiency, Fractures)   Notify office for further evaluation and treatment, questions or concerns if any reported s/s fail to improve.   The patient was advised to call back or seek an in-person evaluation if any symptoms worsen or if the  condition fails to improve as anticipated.   Further disposition pending results of labs. Discussed med's effects and SE's.    I discussed the assessment and treatment plan with the patient. The patient was provided an opportunity to ask questions and all were answered. The patient agreed with the plan and demonstrated an understanding of the instructions.  Discussed med's effects and SE's. Screening labs and tests as requested with regular follow-up as recommended.  I provided 25 minutes of face-to-face time during this encounter including counseling, chart review, and critical decision making was preformed.  Today's Plan of Care is based on a patient-centered health care approach known as shared decision making - the decisions, tests and treatments allow for patient preferences and values to be balanced with clinical evidence.    Future Appointments  Date Time Provider Department Center  11/23/2022  7:05 AM [facility] DEVICE REMOTES [facility]  12/09/2022  9:20 AM [name], Rachelle Hora, MD [facility] None  12/09/2022 10:00 AM [facility] None  12/17/2022  3:40 PM [facility] DI[facility] TOMO 1 [facility]  02/21/2023  8:45 AM Serena Croissant, MD [facility] None  02/22/2023  7:05 AM [facility] DEVICE REMOTES [facility]  05/02/2023  3:00 PM Lucky Cowboy, MD [facility] None  05/24/2023  7:05 AM [facility] DEVICE REMOTES [facility]  07/04/2023 10:15 AM Serena Croissant, MD [facility] None  08/23/2023  7:05 AM [facility] DEVICE REMOTES [facility]  11/22/2023  7:05 AM [facility] DEVICE REMOTES [facility]  02/21/2024  7:05 AM [facility] DEVICE REMOTES [facility]     Plan:   During the course of the visit the patient was educated and counseled about appropriate screening and preventive services including:   Pneumococcal vaccine  Prevnar 13 Influenza vaccine Td vaccine Screening electrocardiogram Bone densitometry screening Colorectal cancer screening Diabetes screening Glaucoma screening Nutrition counseling  Advanced directives: requested   Subjective:  Misty Blackwell is a 89 y.o. female who presents for general follow up. She has Hyperlipidemia, mixed; Essential hypertension; Coronary atherosclerosis by Chest CT on 10/02/2021; GERD; FIBROCYSTIC BREAST DISEASE; Osteoarthritis; Long term current use of anticoagulant therapy; Pacemaker; Vitamin D deficiency; Chronic diastolic heart failure (HCC); History of TIA (transient ischemic attack); Chronic atrial fibrillation (HCC); Neural foraminal stenosis of cervical spine; Abnormal glucose; SSS (sick sinus syndrome) (HCC); Anxiety; Insomnia; Recurrent major 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 (HCC); Malignant neoplasm of upper-outer quadrant of right breast in female, estrogen receptor positive (HCC); Invasive ductal carcinoma of breast, female, right (HCC); Aortic atherosclerosis (HCC) by Chest CT 0n 10/02/2021; Pacemaker battery depletion; Longstanding persistent atrial fibrillation (HCC); Syncope; Scalp hematoma; Forehead laceration; Back pain; At high risk for injury related to fall; and Unstable gait on their problem list.  Overall she reports doing well today.  She has no new concerns at this time.  She continues to live independently and take care of her own househould chores.She has two friends around her who help her out.  She also has a niece who is active in caring for her.  She had a syncopal episode with a fall and visit to the ED 10/02/21 which is believed to be due to pacemaker malfunction, low battery.  She had a pacemaker check, 3 months after generateor change out.  She has a Research officer, political party, but ventricular lead is old 88 lead from 2011 so system is not MRI conditional.  Pacemaker was checked an noted that device function is normal with estimated generator longevity of 13.2 years.  Lead parameters are all in nominal range  and unchanged from the previous visit.  She has 82% ventricular pacing, she is not pacemaker dependent.  The heart rate histogram distribution appears appropriate. She had a single 6 beat run of high ventricular rates that could be nonsustained ventricular tachycardia.  An echocardiogram performed while she was hospitalized for heart failure exacerbation 2022 showed normal left ventricular systolic function and was interpreted as showing pseudo normal filling (diastolic function cannot be evaluated in this patient in atrial fibrillation).  No major valve problems were identified.  She has a f/u scheduled with cardiology 12/2022.  She has a hx of afib, and SSS.  She is on coumadin and followed by the coumadin clinic.  She has chronic diastolic heart failure.  She follows with Dr. Royann Shivers, Cardiology.     BMI is Body mass index is 25.35 kg/m., she has not been working on diet and exercise. Wt Readings from Last 3 Encounters:  11/22/22 150 lb (68 kg)  08/20/22 149 lb 6.4 oz (67.8 kg)  06/28/22 153 lb 11.2 oz (69.7 kg)    Her blood pressure has been controlled at home, today their BP is BP: 136/68 She does not workout. She denies chest pain, shortness of breath, dizziness.  She is not on cholesterol medication and denies myalgias. Her cholesterol is not at goal with elevated triglycerides. The cholesterol last visit was:   Lab Results  Component Value Date   CHOL 164 08/20/2022   HDL 43 (L) 08/20/2022   LDLCALC 93 08/20/2022   TRIG 184 (H) 08/20/2022   CHOLHDL 3.8 08/20/2022   She has not been working on diet and exercise for treatmet of prediabetes, and denies  polydipsia and polyuria. Last A1C in the office was:  Lab Results  Component Value Date   HGBA1C 5.9 (H) 08/20/2022   Last GFR: Lab Results  Component Value Date   EGFR 50 (L) 08/20/2022   Patient is on Vitamin D supplement.   Lab Results  Component Value Date   VD25OH 67 04/29/2022      Medication Review: Current Outpatient Medications on File Prior to Visit  Medication Sig Dispense Refill   acidophilus (RISAQUAD) CAPS capsule Take 1 capsule by mouth daily.     anastrozole (ARIMIDEX) 1 MG tablet TAKE 1 TABLET(1 MG) BY MOUTH DAILY 90 tablet 3   Ascorbic Acid (VITAMIN C) 1000 MG tablet Take 1,000 mg by mouth daily.      betamethasone dipropionate (DIPROLENE) 0.05 % ointment Apply topically 2 (two) times daily.     Cholecalciferol (VITAMIN D-3) 125 MCG (5000 UT) TABS Take 5,000 Units by mouth daily.     Cyanocobalamin (VITAMIN B 12 PO) Take 1,000 mcg by mouth daily.      diltiazem (CARDIZEM CD) 300 MG 24 hr capsule Take 1 capsule (300 mg total) by mouth daily. 90 capsule 3   escitalopram (LEXAPRO) 10 MG tablet Take 1 tablet Daily for Chronic Anxiety &  Mood 90 tablet 3   Ferrous Sulfate 90 (18 Fe) MG TABS Take 18 mg by mouth daily with breakfast.     furosemide (LASIX) 40 MG tablet Take 1 tablet  2 x /day for BP & Fluid Retention /Ankle Swelling 180 tablet 3   gabapentin (NEURONTIN) 100 MG capsule Take  1 to 2 capsules  1 to 2 hours  before Bedtime  for Diabetic Neuropathy Pain & Sleep 180 capsule 3   irbesartan (AVAPRO) 300 MG tablet Take  1 tablet  Daily  for BP & Heart 90 tablet 3  Magnesium 250 MG TABS Take 250 mg by mouth daily.     OVER THE COUNTER MEDICATION Apply 1 application. topically See admin instructions. Neuropathy maximum strength Nerve relief and recovery cream- Apply to the feet at bedtime     potassium chloride SA (KLOR-CON M) 20 MEQ tablet Take  1 tablet  2 x /day  for Potassium                                                                       /                                              TAKE                                         BY                                                    MOUTH 180 tablet 3   warfarin (COUMADIN) 3 MG tablet TAKE 1 TO 1.5 TABLETS BY MOUTH EVERY DAY AS DIRECTED 135 tablet 1   zinc gluconate 50 MG tablet Take 50 mg by mouth daily.     No current facility-administered medications on file prior to visit.    Allergies  Allergen Reactions   Latex Itching   Ace Inhibitors Other (See Comments)    Unknown reaction   Augmentin [Amoxicillin-Pot Clavulanate] Other (See Comments)    Unknown   Ciprofloxacin Other (See Comments)    Unknown    Levaquin [Levofloxacin In D5w] Other (See Comments)    Unknown   Zocor [Simvastatin] Other (See Comments)    Unknown    Acrylic Polymer [Carbomer] Itching   Chocolate Other (See Comments)    Migraines     Current Problems (verified) Patient Active Problem List   Diagnosis Date Noted   At high risk for injury related to fall 11/23/2021   Unstable gait 11/23/2021   Scalp hematoma 10/05/2021   Forehead laceration 10/05/2021   Back pain 10/05/2021   Syncope 10/02/2021   Pacemaker battery depletion 08/24/2021   Longstanding persistent atrial fibrillation (HCC)    Aortic atherosclerosis (HCC) by Chest CT 0n 10/02/2021 01/12/2021   Invasive ductal carcinoma of breast, female, right (HCC) 12/13/2019   Malignant neoplasm of upper-outer quadrant of right breast in female, estrogen receptor positive (HCC) 11/21/2019   Anxiety 06/18/2019   Insomnia 06/18/2019   Recurrent major 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 (HCC) 06/18/2019   SSS (sick sinus syndrome) (HCC) 09/26/2018   Abnormal glucose 08/29/2018   Neural foraminal stenosis of cervical spine 04/18/2018   History of TIA (transient ischemic attack) 04/17/2018   Chronic atrial fibrillation (HCC)    Chronic diastolic heart failure (HCC) 11/10/2017   Vitamin D deficiency 08/15/2013   Pacemaker 09/19/2012   Long term current use of  anticoagulant therapy 07/18/2012  Hyperlipidemia, mixed 09/03/2008   Essential hypertension 09/03/2008   Coronary atherosclerosis by Chest CT on 10/02/2021 09/03/2008   GERD 09/03/2008   FIBROCYSTIC BREAST DISEASE 09/03/2008   Osteoarthritis 09/03/2008    Screening Tests Immunization History  Administered Date(s) Administered   DT (Pediatric) 03/19/2015   Influenza Split 05/04/2011   Influenza, High Dose Seasonal PF 03/19/2014, 12/23/2015, 02/03/2017, 12/26/2018, 01/28/2020   Influenza,inj,quad, With Preservative 05/21/2013   Influenza-Unspecified 02/04/2015, 02/03/2017, 01/28/2020   PFIZER(Purple Top)SARS-COV-2 Vaccination 06/16/2019, 07/09/2019, 03/21/2020   Pneumococcal Conjugate-13 03/19/2014   Pneumococcal Polysaccharide-23 05/04/2011   Pneumococcal-Unspecified 05/03/2001   Td 05/04/2003   Tdap 10/02/2021   Health Maintenance  Topic Date Due   Zoster Vaccines- Shingrix (1 of 2) Never done   DEXA SCAN  Never done   COVID-19 Vaccine (4 - 2023-24 season) 01/01/2022   INFLUENZA VACCINE  12/02/2022   Medicare Annual Wellness (AWV)  08/20/2023   DTaP/Tdap/Td (4 - Td or Tdap) 10/03/2031   Pneumonia Vaccine 61+ Years old  Completed   HPV VACCINES  Aged Out    Patient Care Team: Lucky Cowboy, MD as PCP - General (Internal Medicine) Thurmon Fair, MD as PCP - Cardiology (Cardiology) Barron Alvine, MD (Inactive) as Consulting Physician (Urology) Suanne Marker, MD as Consulting Physician (Neurology) Micki Riley, MD as Consulting Physician (Neurology) Thurmon Fair, MD as Consulting Physician (Cardiology) Louis Meckel, MD (Inactive) as Consulting Physician (Gastroenterology) Pershing Proud, RN as Oncology Nurse Navigator Donnelly Angelica, RN as Oncology Nurse Navigator  SURGICAL HISTORY She  has a past surgical history that includes Cardioversion (11/19/2009); Cardiac catheterization (08/06/2005); Cholecystectomy; Appendectomy; Abdominal hysterectomy;  Skin cancer resection; Tonsillectomy (1938); Breast surgery (Left, 1949); Eye surgery (Bilateral, 2013); Breast lumpectomy with radioactive seed localization (Right, 12/13/2019); PPM GENERATOR CHANGEOUT (N/A, 08/24/2021); Breast biopsy (Right, 11/16/2019); and Breast lumpectomy (Right, 12/13/2019). FAMILY HISTORY Her family history includes Cirrhosis in her brother; Diabetes in her mother; Heart attack in her father; Heart disease in her father and mother. SOCIAL HISTORY She  reports that she has never smoked. She has never used smokeless tobacco. She reports that she does not drink alcohol and does not use drugs.    Review of Systems  Constitutional:  Negative for chills, fever, malaise/fatigue and weight loss.  HENT:  Negative for congestion, hearing loss, sinus pain, sore throat and tinnitus.   Eyes:  Negative for blurred vision, double vision, discharge and redness.  Respiratory:  Negative for cough, sputum production and shortness of breath.   Cardiovascular:  Negative for chest pain and palpitations.  Gastrointestinal:  Negative for abdominal pain, blood in stool, constipation, diarrhea, heartburn, nausea and vomiting.  Genitourinary:  Negative for dysuria, frequency, hematuria and urgency.  Musculoskeletal:  Negative for falls and joint pain.  Skin:  Negative for itching and rash.  Neurological:  Negative for dizziness, tremors, sensory change, weakness and headaches.  Endo/Heme/Allergies:  Bruises/bleeds easily (anticoagulant).  Psychiatric/Behavioral:  Negative for depression, hallucinations and memory loss. The patient is not nervous/anxious and does not have insomnia.      Objective:     Today's Vitals   11/22/22 1041  BP: 136/68  Pulse: 89  Temp: 97.6 F (36.4 C)  SpO2: 97%  Weight: 150 lb (68 kg)  Height: 5' 4.5" (1.638 m)   Body mass index is 25.35 kg/m.  General appearance: alert, no distress, WD/WN, female HEENT: normocephalic, sclerae anicteric, TMs pearly,  nares patent, no discharge or erythema, pharynx normal Oral cavity: MMM, no lesions Neck: supple, no  lymphadenopathy, no thyromegaly, no masses Heart: RRR, normal S1, S2, no murmurs Lungs: CTA bilaterally, no wheezes, rhonchi, or rales Abdomen: +bs, soft, non tender, non distended, no masses, no hepatomegaly, no splenomegaly Musculoskeletal: nontender, no swelling, no obvious deformity Extremities: no edema, no cyanosis, no clubbing Pulses: 2+ symmetric, upper and lower extremities, normal cap refill Neurological: alert, oriented x 3, CN2-12 intact, strength normal upper extremities and lower extremities, sensation normal throughout, DTRs 2+ throughout, no cerebellar signs, gait normal Psychiatric: normal affect, behavior normal, pleasant    Misty Prange, NP   11/22/2022

## 2022-11-23 ENCOUNTER — Ambulatory Visit (INDEPENDENT_AMBULATORY_CARE_PROVIDER_SITE_OTHER): Payer: Medicare Other

## 2022-11-23 DIAGNOSIS — I495 Sick sinus syndrome: Secondary | ICD-10-CM

## 2022-11-23 LAB — LIPID PANEL
Cholesterol: 154 mg/dL (ref <200)
HDL: 39 mg/dL — ABNORMAL LOW (ref >=50)
LDL Cholesterol (Calc): 88 mg/dL (calc)
Non-HDL Cholesterol (Calc): 115 mg/dL (calc) (ref <130)
Total CHOL/HDL Ratio: 3.9 (calc) (ref <5.0)
Triglycerides: 174 mg/dL — ABNORMAL HIGH (ref <150)

## 2022-11-23 LAB — CBC WITH DIFFERENTIAL/PLATELET
Absolute Monocytes: 948 cells/uL (ref 200–950)
Basophils Relative: 0.6 %
Eosinophils Relative: 1 %
HCT: 40.1 % (ref 35.0–45.0)
Hemoglobin: 13.1 g/dL (ref 11.7–15.5)
Neutro Abs: 4266 cells/uL (ref 1500–7800)
Platelets: 237 10*3/uL (ref 140–400)

## 2022-11-23 LAB — HEMOGLOBIN A1C W/OUT EAG: Hgb A1c MFr Bld: 5.9 % of total Hgb — ABNORMAL HIGH (ref <5.7)

## 2022-11-23 LAB — COMPLETE METABOLIC PANEL WITH GFR
AG Ratio: 1.7 (calc) (ref 1.0–2.5)
ALT: 11 U/L (ref 6–29)
AST: 16 U/L (ref 10–35)
Albumin: 4.5 g/dL (ref 3.6–5.1)
Alkaline phosphatase (APISO): 126 U/L (ref 37–153)
BUN: 23 mg/dL (ref 7–25)
CO2: 29 mmol/L (ref 20–32)
Calcium: 9.8 mg/dL (ref 8.6–10.4)
Chloride: 101 mmol/L (ref 98–110)
Creat: 0.86 mg/dL (ref 0.60–0.95)
Globulin: 2.6 g/dL (calc) (ref 1.9–3.7)
Glucose, Bld: 90 mg/dL (ref 65–99)
Potassium: 4.7 mmol/L (ref 3.5–5.3)
Sodium: 140 mmol/L (ref 135–146)
Total Bilirubin: 0.5 mg/dL (ref 0.2–1.2)
Total Protein: 7.1 g/dL (ref 6.1–8.1)
eGFR: 63 mL/min/{1.73_m2} (ref >=60)

## 2022-11-23 LAB — VITAMIN D 25 HYDROXY (VIT D DEFICIENCY, FRACTURES): Vit D, 25-Hydroxy: 115 ng/mL — ABNORMAL HIGH (ref 30–100)

## 2022-11-24 ENCOUNTER — Other Ambulatory Visit: Payer: Self-pay

## 2022-11-24 LAB — CUP PACEART REMOTE DEVICE CHECK
Battery Remaining Longevity: 146 mo
Battery Voltage: 3.07 V
Brady Statistic AP VP Percent: 0 %
Brady Statistic AP VS Percent: 0 %
Brady Statistic AS VP Percent: 96.04 %
Brady Statistic AS VS Percent: 3.96 %
Brady Statistic RA Percent Paced: 0 %
Brady Statistic RV Percent Paced: 96.04 %
Date Time Interrogation Session: 20240722190724
Implantable Lead Connection Status: 753985
Implantable Lead Connection Status: 753985
Implantable Lead Implant Date: 20110428
Implantable Lead Implant Date: 20110428
Implantable Lead Location: 753859
Implantable Lead Location: 753860
Implantable Lead Model: 4092
Implantable Lead Model: 4592
Implantable Pulse Generator Implant Date: 20230424
Lead Channel Impedance Value: 456 Ohm
Lead Channel Impedance Value: 494 Ohm
Lead Channel Impedance Value: 551 Ohm
Lead Channel Impedance Value: 551 Ohm
Lead Channel Pacing Threshold Amplitude: 1 V
Lead Channel Pacing Threshold Pulse Width: 0.4 ms
Lead Channel Sensing Intrinsic Amplitude: 1.375 mV
Lead Channel Sensing Intrinsic Amplitude: 12.25 mV
Lead Channel Sensing Intrinsic Amplitude: 12.25 mV
Lead Channel Setting Pacing Amplitude: 2 V
Lead Channel Setting Pacing Pulse Width: 0.4 ms
Lead Channel Setting Sensing Sensitivity: 1.2 mV
Zone Setting Status: 755011

## 2022-11-24 MED ORDER — CLOTRIMAZOLE-BETAMETHASONE 1-0.05 % EX CREA: TOPICAL_CREAM | Freq: Two times a day (BID) | CUTANEOUS | 3 refills | Status: DC

## 2022-11-26 ENCOUNTER — Other Ambulatory Visit: Payer: Self-pay | Admitting: Nurse Practitioner

## 2022-11-29 ENCOUNTER — Other Ambulatory Visit: Payer: Self-pay | Admitting: Cardiovascular Disease

## 2022-11-30 ENCOUNTER — Telehealth: Payer: Self-pay | Admitting: Nurse Practitioner

## 2022-11-30 NOTE — Telephone Encounter (Signed)
Requesting refill on diltiazem. Pls send to [facility] on file.

## 2022-12-02 MED ORDER — DILTIAZEM HCL ER COATED BEADS 300 MG PO CP24: 300.0000 mg | ORAL_CAPSULE | Freq: Every day | ORAL | 3 refills | Status: DC

## 2022-12-02 NOTE — Addendum Note (Signed)
Addended by: Dionicio Stall on: 12/02/2022 09:22 AM   Modules accepted: Orders

## 2022-12-09 ENCOUNTER — Ambulatory Visit: Payer: Medicare Other | Attending: Cardiovascular Disease | Admitting: Cardiovascular Disease

## 2022-12-09 ENCOUNTER — Ambulatory Visit (INDEPENDENT_AMBULATORY_CARE_PROVIDER_SITE_OTHER): Payer: Medicare Other

## 2022-12-09 ENCOUNTER — Encounter: Payer: Self-pay | Admitting: Cardiovascular Disease

## 2022-12-09 VITALS — BP 128/61 | HR 90 | Ht 64.5 in | Wt 149.2 lb

## 2022-12-09 DIAGNOSIS — I4821 Permanent atrial fibrillation: Secondary | ICD-10-CM

## 2022-12-09 DIAGNOSIS — I10 Essential (primary) hypertension: Secondary | ICD-10-CM | POA: Diagnosis not present

## 2022-12-09 DIAGNOSIS — I5032 Chronic diastolic (congestive) heart failure: Secondary | ICD-10-CM | POA: Diagnosis not present

## 2022-12-09 DIAGNOSIS — E782 Mixed hyperlipidemia: Secondary | ICD-10-CM

## 2022-12-09 DIAGNOSIS — Z950 Presence of cardiac pacemaker: Secondary | ICD-10-CM

## 2022-12-09 DIAGNOSIS — D6869 Other thrombophilia: Secondary | ICD-10-CM

## 2022-12-09 DIAGNOSIS — I482 Chronic atrial fibrillation, unspecified: Secondary | ICD-10-CM | POA: Diagnosis not present

## 2022-12-09 DIAGNOSIS — Z7901 Long term (current) use of anticoagulants: Secondary | ICD-10-CM | POA: Diagnosis not present

## 2022-12-09 LAB — POCT INR: INR: 2.6 (ref 2.0–3.0)

## 2022-12-09 NOTE — Progress Notes (Signed)
[name], female   DOB: [birth year], 89 y.o.   MRN: [id number]    Cardiology Office Note    Date:  12/09/2022   [name], Misty Blackwell 1928-01-28, MRN [id number]  PCP:  Lucky Cowboy, MD  Cardiologist:   Thurmon Fair, MD   Chief Complaint  Patient presents with   Atrial Fibrillation   Pacemaker Check    History of Present Illness:  Misty Blackwell is a 89 y.o. female with a history of atrial fibrillation with slow ventricular response, status post single-chamber permanent pacemaker. She has a new [employer] Azure device implanted 2023, but the ventricular lead is a old 39 lead from 2011 so the system is not MRI conditional.  The show medical problems include remote TIA, hypertension, nonobstructive CAD, asymptomatic NSVT detected by the pacemaker, history of possible diastolic heart failure.    \An echocardiogram performed while she was hospitalized for heart failure exacerbation 2022 showed normal left ventricular systolic function and was interpreted as showing pseudo normal filling (diastolic function cannot be evaluated in this patient in atrial fibrillation).  No major valve problems were identified.  Overall she is doing quite well.  She does not have problems or shortness of breath and only has edema towards the end of the day, limited to the ankles. She continues to live independently and takes care of household chores. She does not have orthopnea or PND.  She has not had problems with chest pain, palpitations, dizziness or syncope.  The pacemaker site is well-healed, but the ventricular lead loop sticks out a little prominently under the skin.  She has quite a bit of fat atrophy after having undergone multiple generator change out procedures.    Pacemaker interrogation shows normal device function.  Estimated generator longevity is 12.2 years.  All the lead parameters look great.  She has 94% ventricular pacing and no episodes of RVR, although she  has had occasional episodes of sudden high ventricular rate that are probably nonsustained VT (7 events in the last 12 months, lasting 2-4 seconds).  Weight is unchanged from last year at 149 pounds which is what we presume would be her "dry weight" blood pressure was initially elevated, normal when rechecked after 10 minutes.   In 2016 she underwent echocardiography and nuclear stress testing.  Left ventricular systolic function was normal, she had a fixed moderate apical defect but without reversible ischemia.  Moderate tricuspid regurgitation and moderate pulmonary artery hypertension with estimated systolic PA pressure of 58 mmHg.  In April 2019 she had a nuclear stress test showing normal perfusion and an echocardiogram which showed unchanged findings of mild LVH, normal left ventricular systolic function, mild left atrial dilation, improved degree of pulmonary hypertension 41 mmHg.   Past Medical History:  Diagnosis Date   Anxiety    Atrial fib/flutter, transient (HCC)    Atrial fibrillation, chronic (HCC)    CHF (congestive heart failure) (HCC) 10/29/2009   Echo - EF >55%; normal LV size and systolic function; unable to assess diastolic fcn due to E/A fusion, pulmonary vein flow pattern suggests elevated filling pressure; marked biatrail dilation, mild/mod tricuspid regurgitation; mod pulmonary htn; mild/mod mitral regurgitation; although echocardiographic features are incomplete findings suggest possible infiltrative cardiomyopathy (maybe amyloidosi   Coronary artery disease 03/19/2002   R/P Cardiolite - EF 76%; nromal static and dynamic myocardial perfusion images; normal wall motion and endocardial thickening in all vascular territories   Dyspnea    Dysrhythmia    Facial numbness  12/26/2008   carotid doppler - R and L ICAs 0-49% diameter reduction (velocities suggest low end of scale)   Hypertension    Pacemaker    Peripheral neuropathy    Pneumonia 2017   Skin cancer    s/p  surgical removal.   Stroke (HCC) 04/2017   TIA (transient ischemic attack)     Past Surgical History:  Procedure Laterality Date   ABDOMINAL HYSTERECTOMY     APPENDECTOMY     BREAST BIOPSY Right 11/16/2019   BREAST LUMPECTOMY Right 12/13/2019   BREAST LUMPECTOMY WITH RADIOACTIVE SEED LOCALIZATION Right 12/13/2019   Procedure: RIGHT BREAST LUMPECTOMY WITH RADIOACTIVE SEED LOCALIZATION;  Surgeon: Manus Rudd, MD;  Location: [facility] OR;  Service: General;  Laterality: Right;  LMA VS MAC   BREAST SURGERY Left 1949   CARDIAC CATHETERIZATION  08/06/2005   minimal coronary disease predominant RCA; no significant atherosclerosis; new onset sick sinus syndrome and atrial flutter w/ ventricular response, controlled on med therapy; systemic HTN, normal renal arteries   CARDIOVERSION  11/19/2009   successful DCCV from AF to sinus type rhythm   CHOLECYSTECTOMY     EYE SURGERY Bilateral 2013   PPM GENERATOR CHANGEOUT N/A 08/24/2021   Procedure: PPM GENERATOR CHANGEOUT;  Surgeon: Thurmon Fair, MD;  Location: [facility] INVASIVE CV LAB;  Service: Cardiovascular;  Laterality: N/A;   Skin cancer resection     TONSILLECTOMY  1938    Outpatient Medications Prior to Visit  Medication Sig Dispense Refill   acidophilus (RISAQUAD) CAPS capsule Take 1 capsule by mouth daily.     anastrozole (ARIMIDEX) 1 MG tablet TAKE 1 TABLET(1 MG) BY MOUTH DAILY 90 tablet 3   Ascorbic Acid (VITAMIN C) 1000 MG tablet Take 1,000 mg by mouth daily.      betamethasone dipropionate (DIPROLENE) 0.05 % ointment Apply topically 2 (two) times daily. 30 g 1   Cholecalciferol (VITAMIN D-3) 125 MCG (5000 UT) TABS Take 5,000 Units by mouth daily.     clotrimazole-betamethasone (LOTRISONE) cream Apply topically 2 (two) times daily. 45 g 3   Cyanocobalamin (VITAMIN B 12 PO) Take 1,000 mcg by mouth daily.      diltiazem (CARDIZEM CD) 300 MG 24 hr capsule Take 1 capsule (300 mg total) by mouth daily. 90 capsule 3   escitalopram (LEXAPRO) 10 MG  tablet Take 1 tablet Daily for Chronic Anxiety &  Mood 90 tablet 3   Ferrous Sulfate 90 (18 Fe) MG TABS Take 18 mg by mouth daily with breakfast.     furosemide (LASIX) 40 MG tablet Take 1 tablet  2 x /day for BP & Fluid Retention /Ankle Swelling 180 tablet 3   gabapentin (NEURONTIN) 100 MG capsule Take  1 to 2 capsules  1 to 2 hours  before Bedtime  for Diabetic Neuropathy Pain & Sleep 180 capsule 3   irbesartan (AVAPRO) 300 MG tablet Take  1 tablet  Daily  for BP & Heart 90 tablet 3   Magnesium 250 MG TABS Take 250 mg by mouth daily.     OVER THE COUNTER MEDICATION Apply 1 application. topically See admin instructions. Neuropathy maximum strength Nerve relief and recovery cream- Apply to the feet at bedtime     potassium chloride SA (KLOR-CON M) 20 MEQ tablet Take  1 tablet  2 x /day  for Potassium                                                                       /  TAKE                                         BY                                                    MOUTH 180 tablet 3   warfarin (COUMADIN) 3 MG tablet TAKE 1 TO 1.5 TABLET BY MOUTH EVERY DAY AS DIRECTED 135 tablet 1   zinc gluconate 50 MG tablet Take 50 mg by mouth daily.     No facility-administered medications prior to visit.     Allergies:   Latex, Ace inhibitors, Augmentin [amoxicillin-pot clavulanate], Ciprofloxacin, Levaquin [levofloxacin in d5w], Zocor [simvastatin], Acrylic polymer [carbomer], and Chocolate   Social History   Socioeconomic History   Marital status: Widowed    Spouse name: Not on file   Number of children: 0   Years of education: Not on file   Highest education level: Not on file  Occupational History   Not on file  Tobacco Use   Smoking status: Never   Smokeless tobacco: Never  Vaping Use   Vaping status: Never Used  Substance and Sexual Activity   Alcohol use: No   Drug use: No   Sexual activity: Never    Birth control/protection: None  Other  Topics Concern   Not on file  Social History Narrative   Widowed.  Lives alone.  Ambulates independently.   Social Determinants of Health   Financial Resource Strain: Not on file  Food Insecurity: Not on file  Transportation Needs: Not on file  Physical Activity: Not on file  Stress: Not on file  Social Connections: Not on file     Family History:  The patient's family history includes Cirrhosis in her brother; Diabetes in her mother; Heart attack in her father; Heart disease in her father and mother.   ROS:   Please see the history of present illness.    ROS All other systems are reviewed and are negative.   PHYSICAL EXAM:   VS:  BP 128/61   Pulse 90   Ht 5' 4.5" (1.638 m)   Wt 149 lb 3.2 oz (67.7 kg)   SpO2 95%   BMI 25.21 kg/m       General: Alert, oriented x3, no distress.  The left subclavian pacemaker site shows scars of previous surgery.  There is no evidence of redness, swelling, warmth or drainage.  The device has migrated a little bit caudally and the loop of ventricular lead above it does put a little bit of pressure on the skin.  However the skin is not adherent to it and there is no evidence of skin undermining. Head: no evidence of trauma, PERRL, EOMI, no exophtalmos or lid lag, no myxedema, no xanthelasma; normal ears, nose and oropharynx Neck: normal jugular venous pulsations and no hepatojugular reflux; brisk carotid pulses without delay and no carotid bruits Chest: clear to auscultation, no signs of consolidation by percussion or palpation, normal fremitus, symmetrical and full respiratory excursions Cardiovascular: normal position and quality of the apical impulse, regular rhythm, normal first and second heart sounds, no murmurs, rubs or gallops Abdomen: no tenderness or distention, no masses by palpation, no abnormal pulsatility or arterial bruits, normal  bowel sounds, no hepatosplenomegaly Extremities: no clubbing, cyanosis or edema; 2+ radial, ulnar and  brachial pulses bilaterally; 2+ right femoral, posterior tibial and dorsalis pedis pulses; 2+ left femoral, posterior tibial and dorsalis pedis pulses; no subclavian or femoral bruits Neurological: grossly nonfocal Psych: Normal mood and affect   Wt Readings from Last 3 Encounters:  12/09/22 149 lb 3.2 oz (67.7 kg)  11/22/22 150 lb (68 kg)  08/20/22 149 lb 6.4 oz (67.8 kg)      Studies/Labs Reviewed:   EKG:  EKG is not ordered today.  ECG from 05/12/2021 is personally reviewed and shows background atrial fibrillation with 100% ventricular paced rhythm.  Intracardiac electrogram shows ventricular fibrillation with controlled ventricular response and mostly ventricular paced beats.  04/29/2022: Magnesium 2.2; TSH 1.69 11/22/2022: ALT 11; BUN 23; Creat 0.86; Hemoglobin 13.1; Platelets 237; Potassium 4.7; Sodium 140   Lipid Panel    Component Value Date/Time   CHOL 154 11/22/2022 1123   TRIG 174 (H) 11/22/2022 1123   HDL 39 (L) 11/22/2022 1123   CHOLHDL 3.9 11/22/2022 1123   VLDL 34 (H) 09/28/2016 1125   LDLCALC 88 11/22/2022 1123     ASSESSMENT:    1. Chronic diastolic heart failure (HCC)   2. Permanent atrial fibrillation (HCC)   3. Essential hypertension   4. Acquired thrombophilia (HCC)   5. Pacemaker   6. Hyperlipidemia, mixed        PLAN:  In order of problems listed above:  CHF: Appears clinically euvolemic.  NYHA functional class I.  Estimated dry weight is around 145-149 pounds. AFib: Permanent arrhythmia, not pacemaker dependent, has roughly 94% ventricular pacing.Marland Kitchen   History of  previous transient ischemic attack (CHADSVasc 8: age 36, TIA 2, HTN, CAD, CHF, gender).  High embolic risk. HTN:   Well-controlled.  Avoid daily diuretics which have been associated with orthostatic hypotension and falls. Anticoagulation: Denies any recent falls or bleeding problems.  Uses a walker at home, sometimes uses a cane when she goes out.  On chronic warfarin.  Followed in our  Coumadin clinic.  She has not had subtherapeutic INR readings since June 2023.  Occasionally supratherapeutic, without bleeding problems. PPM: Normal device function.  Although the pacemaker generator is MRI conditional, the ventricular lead is not MRI compatible.  There is some fat atrophy at the pacemaker site and the loop of ventricular lead does put a little pressure on the skin, but there is no evidence of skin undermining.  Asked her to keep an eye on it and if there is any evidence that the skin becomes translucent, bruised, adherent to the device, she should call us promptly. HLP:  Has incidentally noted aortic atherosclerosis and had minor nonobstructive CAD at remote cardiac catheterization.  She has not had clinically evident CAD or PAD.  LDL cholesterol is a little higher than desirable at 88, but she has not tolerated statins.  At age 68, without an index atherothrombotic event, I think we should continue the same lipid-lowering strategy.      Medication Adjustments/Labs and Tests Ordered: Current medicines are reviewed at length with the patient today.  Concerns regarding medicines are outlined above.  Medication changes, Labs and Tests ordered today are listed in the Patient Instructions below. Patient Instructions  Medication Instructions:   Your physician recommends that you continue on your current medications as directed. Please refer to the Current Medication list given to you today.   *If you need a refill on your cardiac medications before your next  appointment, please call your pharmacy*   Lab Work: NONE ORDERED  TODAY    If you have labs (blood work) drawn today and your tests are completely normal, you will receive your results only by: MyChart Message (if you have MyChart) OR A paper copy in the mail If you have any lab test that is abnormal or we need to change your treatment, we will call you to review the results.   Testing/Procedures: NONE ORDERED   TODAY     Follow-Up: At [facility], you and your health needs are our priority.  As part of our continuing mission to provide you with exceptional heart care, we have created designated Provider Care Teams.  These Care Teams include your primary Cardiologist (physician) and Advanced Practice Providers (APPs -  Physician Assistants and Nurse Practitioners) who all work together to provide you with the care you need, when you need it.  We recommend signing up for the patient portal called "MyChart".  Sign up information is provided on this After Visit Summary.  MyChart is used to connect with patients for Virtual Visits (Telemedicine).  Patients are able to view lab/test results, encounter notes, upcoming appointments, etc.  Non-urgent messages can be sent to your provider as well.   To learn more about what you can do with MyChart, go to [email or web address].    Your next appointment:   1 year(s)  Provider:   Thurmon Fair, MD     Other Instructions       Signed, Thurmon Fair, MD  12/09/2022 3:54 PM    [facility] [address] [place], [place], [place]  [address] Phone: [phone]; Fax: [phone]

## 2022-12-09 NOTE — Progress Notes (Signed)
Remote pacemaker transmission.   

## 2022-12-09 NOTE — Patient Instructions (Signed)
Medication Instructions:   Your physician recommends that you continue on your current medications as directed. Please refer to the Current Medication list given to you today.   *If you need a refill on your cardiac medications before your next appointment, please call your pharmacy*   Lab Work: NONE ORDERED  TODAY    If you have labs (blood work) drawn today and your tests are completely normal, you will receive your results only by: MyChart Message (if you have MyChart) OR A paper copy in the mail If you have any lab test that is abnormal or we need to change your treatment, we will call you to review the results.   Testing/Procedures: NONE ORDERED  TODAY     Follow-Up: At [facility], you and your health needs are our priority.  As part of our continuing mission to provide you with exceptional heart care, we have created designated Provider Care Teams.  These Care Teams include your primary Cardiologist (physician) and Advanced Practice Providers (APPs -  Physician Assistants and Nurse Practitioners) who all work together to provide you with the care you need, when you need it.  We recommend signing up for the patient portal called "MyChart".  Sign up information is provided on this After Visit Summary.  MyChart is used to connect with patients for Virtual Visits (Telemedicine).  Patients are able to view lab/test results, encounter notes, upcoming appointments, etc.  Non-urgent messages can be sent to your provider as well.   To learn more about what you can do with MyChart, go to [email or web address].    Your next appointment:   1 year(s)  Provider:   Thurmon Fair, MD     Other Instructions

## 2022-12-09 NOTE — Patient Instructions (Signed)
Description   Continue taking Warfarin 1 tablet daily except 1.5 tablets Mondays, Wednesdays, and Fridays.  Remain consistent with green leafy veggies.  Repeat INR in 4 weeks.  Call [phone] with any questions

## 2022-12-17 ENCOUNTER — Other Ambulatory Visit: Payer: Self-pay | Admitting: Hematology and Oncology

## 2022-12-17 ENCOUNTER — Ambulatory Visit
Admission: RE | Admit: 2022-12-17 | Discharge: 2022-12-17 | Disposition: A | Discharge status: Home / Self Care | Payer: Medicare Other | Source: Ambulatory Visit | Attending: Hematology and Oncology | Admitting: Hematology and Oncology

## 2022-12-17 DIAGNOSIS — Z9889 Other specified postprocedural states: Secondary | ICD-10-CM

## 2022-12-22 ENCOUNTER — Other Ambulatory Visit: Payer: Self-pay | Admitting: Hematology and Oncology

## 2022-12-22 DIAGNOSIS — R928 Other abnormal and inconclusive findings on diagnostic imaging of breast: Secondary | ICD-10-CM

## 2022-12-27 ENCOUNTER — Other Ambulatory Visit: Payer: Self-pay | Admitting: Internal Medicine

## 2022-12-27 DIAGNOSIS — I10 Essential (primary) hypertension: Secondary | ICD-10-CM

## 2022-12-27 MED ORDER — IRBESARTAN 300 MG PO TABS
ORAL_TABLET | ORAL | 3 refills | Status: DC
Start: 2022-12-27 — End: 2023-09-12

## 2023-01-04 ENCOUNTER — Ambulatory Visit: Payer: Medicare Other | Attending: Cardiovascular Disease

## 2023-01-04 DIAGNOSIS — I482 Chronic atrial fibrillation, unspecified: Secondary | ICD-10-CM

## 2023-01-04 DIAGNOSIS — Z7901 Long term (current) use of anticoagulants: Secondary | ICD-10-CM | POA: Diagnosis not present

## 2023-01-04 LAB — POCT INR: INR: 3.2 — AB (ref 2.0–3.0)

## 2023-01-04 NOTE — Patient Instructions (Signed)
Continue taking Warfarin 1 tablet daily except 1.5 tablets Mondays, Wednesdays, and Fridays.  Remain consistent with green leafy veggies. Eat salad tonight. Repeat INR in 4 weeks.  Call [phone] with any questions

## 2023-01-24 ENCOUNTER — Ambulatory Visit
Admission: RE | Admit: 2023-01-24 | Discharge: 2023-01-24 | Disposition: A | Discharge status: Home / Self Care | Payer: Medicare Other | Source: Ambulatory Visit | Attending: Hematology and Oncology | Admitting: Hematology and Oncology

## 2023-01-24 DIAGNOSIS — R928 Other abnormal and inconclusive findings on diagnostic imaging of breast: Secondary | ICD-10-CM

## 2023-02-01 ENCOUNTER — Ambulatory Visit: Payer: Medicare Other

## 2023-02-07 ENCOUNTER — Ambulatory Visit: Payer: Medicare Other | Attending: Cardiovascular Disease

## 2023-02-07 DIAGNOSIS — I482 Chronic atrial fibrillation, unspecified: Secondary | ICD-10-CM | POA: Diagnosis not present

## 2023-02-07 DIAGNOSIS — Z7901 Long term (current) use of anticoagulants: Secondary | ICD-10-CM | POA: Diagnosis not present

## 2023-02-07 LAB — POCT INR: INR: 3.5 — AB (ref 2.0–3.0)

## 2023-02-07 NOTE — Patient Instructions (Signed)
Description   HOLD today's dose and then START taking Warfarin 1 tablet daily except 1.5 tablets Wednesdays, and Fridays.  Remain consistent with green leafy veggies  Repeat INR in 3 weeks.  Call [phone] with any questions

## 2023-02-08 ENCOUNTER — Telehealth: Payer: Self-pay | Admitting: Hematology and Oncology

## 2023-02-08 NOTE — Telephone Encounter (Signed)
02/08/23; Due to a change in the provider schedule, I called the patient and rescheduled appointment. The patient is aware of the new appointment date and time.

## 2023-02-21 ENCOUNTER — Ambulatory Visit: Payer: Medicare Other | Admitting: Hematology and Oncology

## 2023-02-22 ENCOUNTER — Ambulatory Visit (INDEPENDENT_AMBULATORY_CARE_PROVIDER_SITE_OTHER): Payer: Medicare Other

## 2023-02-22 DIAGNOSIS — I482 Chronic atrial fibrillation, unspecified: Secondary | ICD-10-CM

## 2023-02-23 LAB — CUP PACEART REMOTE DEVICE CHECK
Battery Remaining Longevity: 146 mo
Battery Voltage: 3.05 V
Brady Statistic AP VP Percent: 0 %
Brady Statistic AP VS Percent: 0 %
Brady Statistic AS VP Percent: 96.88 %
Brady Statistic AS VS Percent: 3.12 %
Brady Statistic RA Percent Paced: 0 %
Brady Statistic RV Percent Paced: 96.88 %
Date Time Interrogation Session: 20241022053115
Implantable Lead Connection Status: 753985
Implantable Lead Connection Status: 753985
Implantable Lead Implant Date: 20110428
Implantable Lead Implant Date: 20110428
Implantable Lead Location: 753859
Implantable Lead Location: 753860
Implantable Lead Model: 4092
Implantable Lead Model: 4592
Implantable Pulse Generator Implant Date: 20230424
Lead Channel Impedance Value: 456 Ohm
Lead Channel Impedance Value: 494 Ohm
Lead Channel Impedance Value: 608 Ohm
Lead Channel Impedance Value: 627 Ohm
Lead Channel Pacing Threshold Amplitude: 0.875 V
Lead Channel Pacing Threshold Pulse Width: 0.4 ms
Lead Channel Sensing Intrinsic Amplitude: 1.375 mV
Lead Channel Sensing Intrinsic Amplitude: 12.25 mV
Lead Channel Sensing Intrinsic Amplitude: 12.25 mV
Lead Channel Setting Pacing Amplitude: 2 V
Lead Channel Setting Pacing Pulse Width: 0.4 ms
Lead Channel Setting Sensing Sensitivity: 1.2 mV
Zone Setting Status: 755011

## 2023-02-28 ENCOUNTER — Ambulatory Visit (INDEPENDENT_AMBULATORY_CARE_PROVIDER_SITE_OTHER): Payer: Medicare Other | Admitting: Nurse Practitioner

## 2023-02-28 ENCOUNTER — Encounter: Payer: Self-pay | Admitting: Nurse Practitioner

## 2023-02-28 VITALS — BP 140/76 | HR 80 | Temp 97.6°F | Ht 64.5 in | Wt 150.8 lb

## 2023-02-28 DIAGNOSIS — K219 Gastro-esophageal reflux disease without esophagitis: Secondary | ICD-10-CM

## 2023-02-28 DIAGNOSIS — F5101 Primary insomnia: Secondary | ICD-10-CM

## 2023-02-28 DIAGNOSIS — E782 Mixed hyperlipidemia: Secondary | ICD-10-CM

## 2023-02-28 DIAGNOSIS — I5032 Chronic diastolic (congestive) heart failure: Secondary | ICD-10-CM | POA: Diagnosis not present

## 2023-02-28 DIAGNOSIS — R7309 Other abnormal glucose: Secondary | ICD-10-CM

## 2023-02-28 DIAGNOSIS — I495 Sick sinus syndrome: Secondary | ICD-10-CM | POA: Diagnosis not present

## 2023-02-28 DIAGNOSIS — Z950 Presence of cardiac pacemaker: Secondary | ICD-10-CM

## 2023-02-28 DIAGNOSIS — Z79899 Other long term (current) drug therapy: Secondary | ICD-10-CM

## 2023-02-28 DIAGNOSIS — I482 Chronic atrial fibrillation, unspecified: Secondary | ICD-10-CM

## 2023-02-28 DIAGNOSIS — I2510 Atherosclerotic heart disease of native coronary artery without angina pectoris: Secondary | ICD-10-CM | POA: Diagnosis not present

## 2023-02-28 DIAGNOSIS — E559 Vitamin D deficiency, unspecified: Secondary | ICD-10-CM

## 2023-02-28 DIAGNOSIS — C50911 Malignant neoplasm of unspecified site of right female breast: Secondary | ICD-10-CM

## 2023-02-28 DIAGNOSIS — Z8673 Personal history of transient ischemic attack (TIA), and cerebral infarction without residual deficits: Secondary | ICD-10-CM

## 2023-02-28 DIAGNOSIS — I10 Essential (primary) hypertension: Secondary | ICD-10-CM | POA: Diagnosis not present

## 2023-02-28 DIAGNOSIS — M1990 Unspecified osteoarthritis, unspecified site: Secondary | ICD-10-CM

## 2023-02-28 DIAGNOSIS — F419 Anxiety disorder, unspecified: Secondary | ICD-10-CM

## 2023-02-28 DIAGNOSIS — R2681 Unsteadiness on feet: Secondary | ICD-10-CM

## 2023-02-28 DIAGNOSIS — F3341 Major depressive disorder, recurrent, in partial remission: Secondary | ICD-10-CM

## 2023-02-28 DIAGNOSIS — M4802 Spinal stenosis, cervical region: Secondary | ICD-10-CM

## 2023-02-28 MED ORDER — CLOTRIMAZOLE-BETAMETHASONE 1-0.05 % EX CREA
TOPICAL_CREAM | Freq: Two times a day (BID) | CUTANEOUS | 3 refills | Status: DC
Start: 2023-02-28 — End: 2023-07-11

## 2023-02-28 NOTE — Patient Instructions (Signed)

## 2023-02-28 NOTE — Progress Notes (Signed)
FOLLOW UP  Assessment:   Essential hypertension Continue Amlodipine, Diltiazem, Furosemide, Irbesartan Follows with Cardiology, Dr. Royann Shivers Discussed DASH (Dietary Approaches to Stop Hypertension) DASH diet is lower in sodium than a typical American diet. Cut back on foods that are high in saturated fat, cholesterol, and trans fats. Eat more whole-grain foods, fish, poultry, and nuts Remain active and exercise as tolerated daily.  Monitor BP at home-Call if greater than 130/80.  Check CMP/CBC  Atherosclerosis of native coronary artery of native heart without angina pectoris Discussed lifestyle modifications. Recommended diet heavy in fruits and veggies, omega 3's. Decrease consumption of animal meats, cheeses, and dairy products. Remain active and exercise as tolerated. Continue to monitor. Check lipids/TSH  Chronic diastolic heart failure (HCC) Continue furosemide Monitor BLE edema; elevated feet, compression stockings Limit salt intake Cardiology following  SSS (sick sinus syndrome) (HCC)/Pacemaker Cardiology following Continue to monitor  Chronic atrial fibrillation (HCC)/Long term anticoagulation Continue Coumadin - managed by coumadin clinic Monitor PT/INR  Gastroesophageal reflux disease without esophagitis No suspected reflux complications (Barret/stricture). Lifestyle modification:  wt loss, avoid meals 2-3h before bedtime. Consider eliminating food triggers:  chocolate, caffeine, EtOH, acid/spicy food.  Osteoarthritis/spinal stenosis Pursue a combination of weight-bearing exercises and strength training. Advised on fall prevention measures including proper lighting in all rooms, removal of area rugs and floor clutter, use of walking devices as deemed appropriate, avoidance of uneven walking surfaces. Consume 800 to 1000 IU of vitamin D daily with a goal vitamin D serum value of 30 ng/mL or higher. Aim for 1000 to 1200 mg of elemental calcium daily through  supplements and/or dietary sources.  Hyperlipidemia, mixed Discussed lifestyle modifications. Recommended diet heavy in fruits and veggies, omega 3's. Decrease consumption of animal meats, cheeses, and dairy products. Remain active and exercise as tolerated. Continue to monitor. Check lipids/TSH  Vitamin D deficiency Continue supplement Check and monitor levels  History of TIA (transient ischemic attack) Continue to monitor  Abnormal glucose Education: Reviewed 'ABCs' of diabetes management  Discussed goals to be met and/or maintained include A1C (<7) Blood pressure (<130/80) Cholesterol (LDL <70) Continue Eye Exam yearly  Continue Dental Exam Q6 mo Discussed dietary recommendations Discussed Physical Activity recommendations Check A1C  Anxiety/Depression Reviewed relaxation techniques.  Sleep hygiene. Recommended mindfulness meditation and exercise.   Psychoeducation:  encouraged personality growth wand development through coping techniques and problem-solving skills.  Insomnia, unspecified type Discussed good sleep hygiene. Establish bed and wake times. Sleep restriction-only sleep estimated hrs sleep. Bed only for sex and sleep, only sleep when sleepy, out of bed if anxious (stimulus control). Reviewed relaxation techniques, mindful meditations. Expected sleep duration. Addressed worries about not sleeping.   Invasive ductal carcinoma of breast, female, right [facility]) Cancer staging 11/15/2019 Right lumpectomy 12/13/2019 Follows with Oncology, Dr. Pamelia Hoit  Unstable gait Continue with walker. Wear good fitting shoes Monitor surroundings to help prevent falls.  Medication management All medications discussed and reviewed in full. All questions and concerns regarding medications addressed.    Pacemaker Cardiology following - updated apt scheduled for 12/2022  Orders Placed This Encounter  Procedures   CBC with Differential/Platelet   COMPLETE METABOLIC PANEL  WITH GFR   Lipid panel   Hemoglobin A1c   Meds ordered this encounter  Medications   clotrimazole-betamethasone (LOTRISONE) cream    Sig: Apply topically 2 (two) times daily.    Dispense:  45 g    Refill:  3   Notify office for further evaluation and treatment, questions or concerns if any reported  s/s fail to improve.   The patient was advised to call back or seek an in-person evaluation if any symptoms worsen or if the condition fails to improve as anticipated.   Further disposition pending results of labs. Discussed med's effects and SE's.    I discussed the assessment and treatment plan with the patient. The patient was provided an opportunity to ask questions and all were answered. The patient agreed with the plan and demonstrated an understanding of the instructions.  Discussed med's effects and SE's. Screening labs and tests as requested with regular follow-up as recommended.  I provided 30 minutes of face-to-face time during this encounter including counseling, chart review, and critical decision making was preformed.  Today's Plan of Care is based on a patient-centered health care approach known as shared decision making - the decisions, tests and treatments allow for patient preferences and values to be balanced with clinical evidence.    Future Appointments  Date Time Provider Department Center  03/01/2023  9:45 AM [facility] None  03/25/2023  9:00 AM Serena Croissant, MD [facility] None  05/24/2023  7:05 AM [facility] DEVICE REMOTES [facility]  06/06/2023  2:00 PM Lucky Cowboy, MD [facility] None  07/04/2023 10:15 AM Serena Croissant, MD [facility] None  08/23/2023  7:05 AM [facility] DEVICE REMOTES [facility]  11/22/2023  7:05 AM [facility] DEVICE REMOTES [facility]  02/21/2024  7:05 AM [facility] DEVICE REMOTES [facility]    Subjective:  Misty Blackwell is a 89 y.o. female who  presents for general follow up. She has Hyperlipidemia, mixed; Essential hypertension; Coronary atherosclerosis by Chest CT on 10/02/2021; GERD; FIBROCYSTIC BREAST DISEASE; Osteoarthritis; Long term current use of anticoagulant therapy; Pacemaker; Vitamin D deficiency; Chronic diastolic heart failure (HCC); History of TIA (transient ischemic attack); Chronic atrial fibrillation (HCC); Neural foraminal stenosis of cervical spine; Abnormal glucose; SSS (sick sinus syndrome) (HCC); Anxiety; Insomnia; Recurrent major 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 (HCC); Malignant neoplasm of upper-outer quadrant of right breast in female, estrogen receptor positive (HCC); Invasive ductal carcinoma of breast, female, right (HCC); Aortic atherosclerosis (HCC) by Chest CT 0n 10/02/2021; Pacemaker battery depletion; Longstanding persistent atrial fibrillation (HCC); Syncope; Scalp hematoma; Forehead laceration; Back pain; At high risk for injury related to fall; and Unstable gait on their problem list.  Overall she reports doing well today.  She has no new concerns at this time.  She continues to live independently and take care of her own househould chores.She has two friends around her who help her out.  She also has a niece who is active in caring for her.  She continues to remain active in the church.  She had a syncopal episode with a fall and visit to the ED 10/02/21 which is believed to be due to pacemaker malfunction, low battery. She had a pacemaker check, 3 months after generateor change out.  She has a Research officer, political party, but ventricular lead is old 96 lead from 2011 so system is not MRI conditional.  Pacemaker was checked an noted that device function is normal with estimated generator longevity of 13.2 years.  Lead parameters are all in nominal range and unchanged from the previous visit.  She has 82% ventricular pacing, she is not pacemaker dependent.  The heart rate histogram distribution appears appropriate. She had  a single 6 beat run of high ventricular rates that could be nonsustained ventricular tachycardia.   An echocardiogram performed while she was hospitalized for heart failure exacerbation 2022 showed normal left ventricular systolic  function and was interpreted as showing pseudo normal filling (diastolic function cannot be evaluated in this patient in atrial fibrillation).  No major valve problems were identified.  She has a f/u scheduled with cardiology 12/2022.  She has a hx of afib, and SSS.  She is on coumadin and followed by the coumadin clinic.  She has chronic diastolic heart failure.  She follows with Dr. Royann Shivers, Cardiology.     BMI is Body mass index is 25.49 kg/m., she has not been working on diet and exercise. Wt Readings from Last 3 Encounters:  02/28/23 150 lb 12.8 oz (68.4 kg)  12/09/22 149 lb 3.2 oz (67.7 kg)  11/22/22 150 lb (68 kg)    Her blood pressure has been controlled at home, today their BP is BP: (!) 140/76 She does not workout. She denies chest pain, shortness of breath, dizziness.  She is not on cholesterol medication and denies myalgias. Her cholesterol is not at goal with elevated triglycerides. The cholesterol last visit was:   Lab Results  Component Value Date   CHOL 154 11/22/2022   HDL 39 (L) 11/22/2022   LDLCALC 88 11/22/2022   TRIG 174 (H) 11/22/2022   CHOLHDL 3.9 11/22/2022   She has not been working on diet and exercise for treatmet of prediabetes, and denies polydipsia and polyuria. Last A1C in the office was:  Lab Results  Component Value Date   HGBA1C 5.9 (H) 11/22/2022   Last GFR: Lab Results  Component Value Date   EGFR 63 11/22/2022   Patient is on Vitamin D supplement.   Lab Results  Component Value Date   VD25OH 115 (H) 11/22/2022      Medication Review: Current Outpatient Medications on File Prior to Visit  Medication Sig Dispense Refill   acidophilus (RISAQUAD) CAPS capsule Take 1 capsule by mouth daily.     anastrozole  (ARIMIDEX) 1 MG tablet TAKE 1 TABLET(1 MG) BY MOUTH DAILY 90 tablet 3   Ascorbic Acid (VITAMIN C) 1000 MG tablet Take 1,000 mg by mouth daily.      betamethasone dipropionate (DIPROLENE) 0.05 % ointment Apply topically 2 (two) times daily. 30 g 1   Cholecalciferol (VITAMIN D-3) 125 MCG (5000 UT) TABS Take 5,000 Units by mouth daily.     Cyanocobalamin (VITAMIN B 12 PO) Take 1,000 mcg by mouth daily.      diltiazem (CARDIZEM CD) 300 MG 24 hr capsule Take 1 capsule (300 mg total) by mouth daily. 90 capsule 3   escitalopram (LEXAPRO) 10 MG tablet Take 1 tablet Daily for Chronic Anxiety &  Mood 90 tablet 3   Ferrous Sulfate 90 (18 Fe) MG TABS Take 18 mg by mouth daily with breakfast.     furosemide (LASIX) 40 MG tablet Take 1 tablet  2 x /day for BP & Fluid Retention /Ankle Swelling 180 tablet 3   gabapentin (NEURONTIN) 100 MG capsule Take  1 to 2 capsules  1 to 2 hours  before Bedtime  for Diabetic Neuropathy Pain & Sleep 180 capsule 3   irbesartan (AVAPRO) 300 MG tablet Take  1 tablet  Daily  for BP & Heart                                                                         /  TAKE                                         BY                                                 MOUTH 90 tablet 3   Magnesium 250 MG TABS Take 250 mg by mouth daily.     OVER THE COUNTER MEDICATION Apply 1 application. topically See admin instructions. Neuropathy maximum strength Nerve relief and recovery cream- Apply to the feet at bedtime     potassium chloride SA (KLOR-CON M) 20 MEQ tablet Take  1 tablet  2 x /day  for Potassium                                                                       /                                             TAKE                                         BY                                                    MOUTH 180 tablet 3   warfarin (COUMADIN) 3 MG tablet TAKE 1 TO 1.5 TABLET BY MOUTH EVERY DAY AS DIRECTED 135 tablet 1    zinc gluconate 50 MG tablet Take 50 mg by mouth daily.     No current facility-administered medications on file prior to visit.    Allergies  Allergen Reactions   Latex Itching   Ace Inhibitors Other (See Comments)    Unknown reaction   Augmentin [Amoxicillin-Pot Clavulanate] Other (See Comments)    Unknown   Ciprofloxacin Other (See Comments)    Unknown    Levaquin [Levofloxacin In D5w] Other (See Comments)    Unknown   Zocor [Simvastatin] Other (See Comments)    Unknown    Acrylic Polymer [Carbomer] Itching   Chocolate Other (See Comments)    Migraines     Current Problems (verified) Patient Active Problem List   Diagnosis Date Noted   At high risk for injury related to fall 11/23/2021   Unstable gait 11/23/2021   Scalp hematoma 10/05/2021   Forehead laceration 10/05/2021   Back pain 10/05/2021   Syncope 10/02/2021   Pacemaker battery depletion 08/24/2021   Longstanding persistent atrial fibrillation (HCC)    Aortic atherosclerosis (HCC) by Chest CT 0n 10/02/2021 01/12/2021   Invasive ductal carcinoma of breast, female, right (HCC) 12/13/2019   Malignant neoplasm of upper-outer quadrant of  right breast in female, estrogen receptor positive (HCC) 11/21/2019   Anxiety 06/18/2019   Insomnia 06/18/2019   Recurrent major 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 (HCC) 06/18/2019   SSS (sick sinus syndrome) (HCC) 09/26/2018   Abnormal glucose 08/29/2018   Neural foraminal stenosis of cervical spine 04/18/2018   History of TIA (transient ischemic attack) 04/17/2018   Chronic atrial fibrillation (HCC)    Chronic diastolic heart failure (HCC) 11/10/2017   Vitamin D deficiency 08/15/2013   Pacemaker 09/19/2012   Long term current use of anticoagulant therapy 07/18/2012   Hyperlipidemia, mixed 09/03/2008   Essential hypertension 09/03/2008   Coronary atherosclerosis by Chest CT on 10/02/2021 09/03/2008   GERD 09/03/2008   FIBROCYSTIC BREAST DISEASE 09/03/2008   Osteoarthritis  09/03/2008    Screening Tests Immunization History  Administered Date(s) Administered   DT (Pediatric) 03/19/2015   Influenza Split 05/04/2011   Influenza, High Dose Seasonal PF 03/19/2014, 12/23/2015, 02/03/2017, 12/26/2018, 01/28/2020, 01/31/2023   Influenza,inj,quad, With Preservative 05/21/2013   Influenza-Unspecified 02/04/2015, 02/03/2017, 01/28/2020   PFIZER(Purple Top)SARS-COV-2 Vaccination 06/16/2019, 07/09/2019, 03/21/2020   Pneumococcal Conjugate-13 03/19/2014   Pneumococcal Polysaccharide-23 05/04/2011   Pneumococcal-Unspecified 05/03/2001   Td 05/04/2003   Tdap 10/02/2021   Health Maintenance  Topic Date Due   Zoster Vaccines- Shingrix (1 of 2) Never done   DEXA SCAN  Never done   COVID-19 Vaccine (4 - 2023-24 season) 01/02/2023   Medicare Annual Wellness (AWV)  08/20/2023   DTaP/Tdap/Td (4 - Td or Tdap) 10/03/2031   Pneumonia Vaccine 24+ Years old  Completed   INFLUENZA VACCINE  Completed   HPV VACCINES  Aged Out    Patient Care Team: Lucky Cowboy, MD as PCP - General (Internal Medicine) Thurmon Fair, MD as PCP - Cardiology (Cardiology) Barron Alvine, MD (Inactive) as Consulting Physician (Urology) Suanne Marker, MD as Consulting Physician (Neurology) Micki Riley, MD as Consulting Physician (Neurology) Thurmon Fair, MD as Consulting Physician (Cardiology) Louis Meckel, MD (Inactive) as Consulting Physician (Gastroenterology) Pershing Proud, RN as Oncology Nurse Navigator Donnelly Angelica, RN as Oncology Nurse Navigator  SURGICAL HISTORY She  has a past surgical history that includes Cardioversion (11/19/2009); Cardiac catheterization (08/06/2005); Cholecystectomy; Appendectomy; Abdominal hysterectomy; Skin cancer resection; Tonsillectomy (1938); Breast surgery (Left, 1949); Eye surgery (Bilateral, 2013); Breast lumpectomy with radioactive seed localization (Right, 12/13/2019); PPM GENERATOR CHANGEOUT (N/A, 08/24/2021); Breast biopsy  (Right, 11/16/2019); and Breast lumpectomy (Right, 12/13/2019). FAMILY HISTORY Her family history includes Cirrhosis in her brother; Diabetes in her mother; Heart attack in her father; Heart disease in her father and mother. SOCIAL HISTORY She  reports that she has never smoked. She has never used smokeless tobacco. She reports that she does not drink alcohol and does not use drugs.    Review of Systems  Constitutional:  Negative for chills, fever, malaise/fatigue and weight loss.  HENT:  Negative for congestion, hearing loss, sinus pain, sore throat and tinnitus.   Eyes:  Negative for blurred vision, double vision, discharge and redness.  Respiratory:  Negative for cough, sputum production and shortness of breath.   Cardiovascular:  Negative for chest pain and palpitations.  Gastrointestinal:  Negative for abdominal pain, blood in stool, constipation, diarrhea, heartburn, nausea and vomiting.  Genitourinary:  Negative for dysuria, frequency, hematuria and urgency.  Musculoskeletal:  Negative for falls and joint pain.  Skin:  Negative for itching and rash.  Neurological:  Negative for dizziness, tremors, sensory change, weakness and headaches.  Endo/Heme/Allergies:  Bruises/bleeds easily (anticoagulant).  Psychiatric/Behavioral:  Negative for  depression, hallucinations and memory loss. The patient is not nervous/anxious and does not have insomnia.      Objective:     Today's Vitals   02/28/23 1035  BP: (!) 140/76  Pulse: 80  Temp: 97.6 F (36.4 C)  SpO2: 98%  Weight: 150 lb 12.8 oz (68.4 kg)  Height: 5' 4.5" (1.638 m)    Body mass index is 25.49 kg/m.  General appearance: alert, no distress, WD/WN, female HEENT: normocephalic, sclerae anicteric, TMs pearly, nares patent, no discharge or erythema, pharynx normal Oral cavity: MMM, no lesions Neck: supple, no lymphadenopathy, no thyromegaly, no masses Heart: RRR, normal S1, S2, no murmurs Lungs: CTA bilaterally, no wheezes,  rhonchi, or rales Abdomen: +bs, soft, non tender, non distended, no masses, no hepatomegaly, no splenomegaly Musculoskeletal: nontender, no swelling, no obvious deformity Extremities: no edema, no cyanosis, no clubbing Pulses: 2+ symmetric, upper and lower extremities, normal cap refill Neurological: alert, oriented x 3, CN2-12 intact, strength normal upper extremities and lower extremities, sensation normal throughout, DTRs 2+ throughout, no cerebellar signs, gait normal Psychiatric: normal affect, behavior normal, pleasant    Tereza Gilham, NP   02/28/2023

## 2023-03-01 ENCOUNTER — Ambulatory Visit: Payer: Medicare Other | Attending: Internal Medicine | Admitting: *Deleted

## 2023-03-01 ENCOUNTER — Other Ambulatory Visit: Payer: Self-pay | Admitting: Nurse Practitioner

## 2023-03-01 DIAGNOSIS — I482 Chronic atrial fibrillation, unspecified: Secondary | ICD-10-CM

## 2023-03-01 DIAGNOSIS — E875 Hyperkalemia: Secondary | ICD-10-CM

## 2023-03-01 DIAGNOSIS — Z7901 Long term (current) use of anticoagulants: Secondary | ICD-10-CM

## 2023-03-01 LAB — LIPID PANEL
Cholesterol: 167 mg/dL (ref <200)
HDL: 45 mg/dL — ABNORMAL LOW (ref >=50)
LDL Cholesterol (Calc): 90 mg/dL
Non-HDL Cholesterol (Calc): 122 mg/dL (ref <130)
Total CHOL/HDL Ratio: 3.7 (calc) (ref <5.0)
Triglycerides: 228 mg/dL — ABNORMAL HIGH (ref <150)

## 2023-03-01 LAB — CBC WITH DIFFERENTIAL/PLATELET
Absolute Lymphocytes: 2379 {cells}/uL (ref 850–3900)
Absolute Monocytes: 916 {cells}/uL (ref 200–950)
Basophils Absolute: 46 {cells}/uL (ref 0–200)
Basophils Relative: 0.6 %
Eosinophils Absolute: 46 {cells}/uL (ref 15–500)
Eosinophils Relative: 0.6 %
HCT: 40.9 % (ref 35.0–45.0)
Hemoglobin: 13 g/dL (ref 11.7–15.5)
MCH: 31.4 pg (ref 27.0–33.0)
MCHC: 31.8 g/dL — ABNORMAL LOW (ref 32.0–36.0)
MCV: 98.8 fL (ref 80.0–100.0)
MPV: 10.8 fL (ref 7.5–12.5)
Monocytes Relative: 11.9 %
Neutro Abs: 4312 {cells}/uL (ref 1500–7800)
Neutrophils Relative %: 56 %
Platelets: 239 10*3/uL (ref 140–400)
RBC: 4.14 10*6/uL (ref 3.80–5.10)
RDW: 11.4 % (ref 11.0–15.0)
Total Lymphocyte: 30.9 %
WBC: 7.7 10*3/uL (ref 3.8–10.8)

## 2023-03-01 LAB — COMPLETE METABOLIC PANEL WITH GFR
AG Ratio: 1.7 (calc) (ref 1.0–2.5)
ALT: 10 U/L (ref 6–29)
AST: 15 U/L (ref 10–35)
Albumin: 4.3 g/dL (ref 3.6–5.1)
Alkaline phosphatase (APISO): 133 U/L (ref 37–153)
BUN: 25 mg/dL (ref 7–25)
CO2: 30 mmol/L (ref 20–32)
Calcium: 10 mg/dL (ref 8.6–10.4)
Chloride: 105 mmol/L (ref 98–110)
Creat: 0.74 mg/dL (ref 0.60–0.95)
Globulin: 2.6 g/dL (ref 1.9–3.7)
Glucose, Bld: 97 mg/dL (ref 65–99)
Potassium: 5.4 mmol/L — ABNORMAL HIGH (ref 3.5–5.3)
Sodium: 142 mmol/L (ref 135–146)
Total Bilirubin: 0.7 mg/dL (ref 0.2–1.2)
Total Protein: 6.9 g/dL (ref 6.1–8.1)
eGFR: 75 mL/min/{1.73_m2} (ref >=60)

## 2023-03-01 LAB — HEMOGLOBIN A1C
Hgb A1c MFr Bld: 5.7 %{Hb} — ABNORMAL HIGH (ref <5.7)
Mean Plasma Glucose: 117 mg/dL
eAG (mmol/L): 6.5 mmol/L

## 2023-03-01 LAB — POCT INR: INR: 3 (ref 2.0–3.0)

## 2023-03-01 NOTE — Patient Instructions (Signed)
Description   Continue taking Warfarin 1 tablet daily except 1.5 tablets Wednesdays and Fridays.  Have a leafy veggie today and remain consistent with green leafy veggies  Repeat INR in 4 weeks.  Call [phone] with any questions

## 2023-03-11 NOTE — Progress Notes (Signed)
Remote pacemaker transmission.   

## 2023-03-25 ENCOUNTER — Inpatient Hospital Stay: Payer: Medicare Other | Attending: Hematology and Oncology | Admitting: Hematology and Oncology

## 2023-03-25 VITALS — BP 157/77 | HR 96 | Temp 97.5°F | Resp 18 | Ht 64.5 in | Wt 146.6 lb

## 2023-03-25 DIAGNOSIS — G629 Polyneuropathy, unspecified: Secondary | ICD-10-CM | POA: Insufficient documentation

## 2023-03-25 DIAGNOSIS — Z79811 Long term (current) use of aromatase inhibitors: Secondary | ICD-10-CM | POA: Insufficient documentation

## 2023-03-25 DIAGNOSIS — Z170 Estrogen receptor positive status [ER+]: Secondary | ICD-10-CM | POA: Diagnosis not present

## 2023-03-25 DIAGNOSIS — Z85828 Personal history of other malignant neoplasm of skin: Secondary | ICD-10-CM | POA: Insufficient documentation

## 2023-03-25 DIAGNOSIS — C50411 Malignant neoplasm of upper-outer quadrant of right female breast: Secondary | ICD-10-CM | POA: Diagnosis present

## 2023-03-25 NOTE — Assessment & Plan Note (Signed)
12/13/19: Right lumpectomy ([facility]): invasive lobular carcinoma, grade 2, 2.4cm, clear margins.  ER 80%, PR 50%, HER-2 negative, Ki-67 5%   Treatment plan: Anastrozole adjuvant therapy 1 mg daily x5 years started September 2021 Anastrozole toxicities: Hot flashes have resolved. Otherwise tolerating it extremely well.    Breast cancer surveillance: Mammogram 12/21/2022.  Right breast possible asymmetry/architectural distortion, diagnostic mammogram and targeted ultrasound 0.5 cm (benign cysts)   She likes to travel around with her church group to hear [place] speakers. Chronic peripheral neuropathy: Improving.  She is now using a cane to get around.   Return to clinic in 1 year for follow-up

## 2023-03-25 NOTE — Progress Notes (Signed)
Patient Care Team: Lucky Cowboy, MD as PCP - General (Internal Medicine) [name], Rachelle Hora, MD as PCP - Cardiology (Cardiology) Barron Alvine, MD (Inactive) as Consulting Physician (Urology) Suanne Marker, MD as Consulting Physician (Neurology) Micki Riley, MD as Consulting Physician (Neurology) [name], Rachelle Hora, MD as Consulting Physician (Cardiology) Louis Meckel, MD (Inactive) as Consulting Physician (Gastroenterology) Pershing Proud, RN as Oncology Nurse Navigator Donnelly Angelica, RN as Oncology Nurse Navigator  DIAGNOSIS:  Encounter Diagnosis  Name Primary?   Malignant neoplasm of upper-outer quadrant of right breast in female, estrogen receptor positive (HCC) Yes    SUMMARY OF ONCOLOGIC HISTORY: Oncology History  Malignant neoplasm of upper-outer quadrant of right breast in female, estrogen receptor positive (HCC)  11/15/2019 Cancer Staging   Staging form: Breast, AJCC 8th Edition - Clinical stage from 11/15/2019: Stage IA (cT1c, cN0, cM0, G2, ER+, PR+, HER2-) - Signed by Loa Socks, NP on 11/21/2019   11/15/2019 Initial Biopsy   Palpable right breast mass: 1.5 cm at 12 o'clock position, benign intramammary lymph node, axilla negative, right breast biopsy 1 grade 2 ER 80%, PR 50%, Ki-67 5%, HER-2 negative   12/13/2019 Surgery   Right lumpectomy ([facility]i): invasive lobular carcinoma, grade 2, 2.4cm, clear margins.      CHIEF COMPLIANT: F/U on Anastrozole  HISTORY OF PRESENT ILLNESS:   History of Present Illness   Miss Misty Blackwell, an elderly patient with a history of breast cancer,  skin cancer and neuropathy, presents for a routine check-up. Over the past year, the patient reports generally good health and has been actively participating in church group activities, including traveling. However, the patient notes that neuropathy in the feet and legs has been limiting mobility and causing discomfort. The patient used to be a clogger and misses the  activity, but lacks the stamina due to the neuropathy.  The patient also mentions a recent skin check for a mole, which was found to be non-cancerous. The patient is currently on a hormone therapy pill, anastrozole, for the past three years with no reported side effects and is willing to continue for two more years. The patient also takes other medications including B12, iron, potassium, warfarin, and zinc. The patient used to take Lexapro, an antidepressant, but has since stopped.         ALLERGIES:  is allergic to latex, ace inhibitors, augmentin [amoxicillin-pot clavulanate], ciprofloxacin, levaquin [levofloxacin in d5w], zocor [simvastatin], acrylic polymer [carbomer], and chocolate.  MEDICATIONS:  Current Outpatient Medications  Medication Sig Dispense Refill   acidophilus (RISAQUAD) CAPS capsule Take 1 capsule by mouth daily.     anastrozole (ARIMIDEX) 1 MG tablet TAKE 1 TABLET(1 MG) BY MOUTH DAILY 90 tablet 3   Ascorbic Acid (VITAMIN C) 1000 MG tablet Take 1,000 mg by mouth daily.      Cholecalciferol (VITAMIN D-3) 125 MCG (5000 UT) TABS Take 5,000 Units by mouth daily.     clotrimazole-betamethasone (LOTRISONE) cream Apply topically 2 (two) times daily. 45 g 3   Cyanocobalamin (VITAMIN B 12 PO) Take 1,000 mcg by mouth daily.      diltiazem (CARDIZEM CD) 300 MG 24 hr capsule Take 1 capsule (300 mg total) by mouth daily. 90 capsule 3   Ferrous Sulfate 90 (18 Fe) MG TABS Take 18 mg by mouth daily with breakfast.     furosemide (LASIX) 40 MG tablet Take 1 tablet  2 x /day for BP & Fluid Retention /Ankle Swelling 180 tablet 3   gabapentin (NEURONTIN) 100 MG  capsule Take  1 to 2 capsules  1 to 2 hours  before Bedtime  for Diabetic Neuropathy Pain & Sleep 180 capsule 3   irbesartan (AVAPRO) 300 MG tablet Take  1 tablet  Daily  for BP & Heart                                                                         /                                                                   TAKE                                          BY                                                 MOUTH 90 tablet 3   Magnesium 250 MG TABS Take 250 mg by mouth daily.     OVER THE COUNTER MEDICATION Apply 1 application. topically See admin instructions. Neuropathy maximum strength Nerve relief and recovery cream- Apply to the feet at bedtime     potassium chloride SA (KLOR-CON M) 20 MEQ tablet Take  1 tablet  2 x /day  for Potassium                                                                       /                                             TAKE                                         BY                                                    MOUTH 180 tablet 3   warfarin (COUMADIN) 3 MG tablet TAKE 1 TO 1.5 TABLET BY MOUTH EVERY DAY AS DIRECTED 135 tablet 1   zinc gluconate 50 MG tablet Take 50 mg by mouth daily.     No current facility-administered medications for this visit.  PHYSICAL EXAMINATION: ECOG PERFORMANCE STATUS: 1 - Symptomatic but completely ambulatory  Vitals:   03/25/23 0918  BP: (!) 157/77  Pulse: 96  Resp: 18  Temp: (!) 97.5 F (36.4 C)  SpO2: 97%   Filed Weights   03/25/23 0918  Weight: 146 lb 9.6 oz (66.5 kg)      LABORATORY DATA:  I have reviewed the data as listed    Latest Ref Rng & Units 02/28/2023   11:20 AM 11/22/2022   11:23 AM 08/20/2022    9:45 AM  CMP  Glucose 65 - 99 mg/dL 97  90  78   BUN 7 - 25 mg/dL 25  23  26    Creatinine 0.60 - 0.95 mg/dL 4.01  0.27  2.53   Sodium 135 - 146 mmol/L 142  140  145   Potassium 3.5 - 5.3 mmol/L 5.4  4.7  4.0   Chloride 98 - 110 mmol/L 105  101  103   CO2 20 - 32 mmol/L 30  29  30    Calcium 8.6 - 10.4 mg/dL 66.4  9.8  9.9   Total Protein 6.1 - 8.1 g/dL 6.9  7.1  7.0   Total Bilirubin 0.2 - 1.2 mg/dL 0.7  0.5  0.5   AST 10 - 35 U/L 15  16  16    ALT 6 - 29 U/L 10  11  13      Lab Results  Component Value Date   WBC 7.7 02/28/2023   HGB 13.0 02/28/2023   HCT 40.9 02/28/2023   MCV 98.8 02/28/2023   PLT 239 02/28/2023    NEUTROABS 4,312 02/28/2023    ASSESSMENT & PLAN:  Malignant neoplasm of upper-outer quadrant of right breast in female, estrogen receptor positive (HCC) 12/13/19: Right lumpectomy ([facility]): invasive lobular carcinoma, grade 2, 2.4cm, clear margins.  ER 80%, PR 50%, HER-2 negative, Ki-67 5%   Treatment plan: Anastrozole adjuvant therapy 1 mg daily x5 years started September 2021 Anastrozole toxicities: Hot flashes have resolved. Otherwise tolerating it extremely well.    Breast cancer surveillance: Mammogram 12/21/2022.  Right breast possible asymmetry/architectural distortion, diagnostic mammogram and targeted ultrasound 0.5 cm (benign cysts)   She likes to travel around with her church group to hear [place] speakers. Chronic peripheral neuropathy: Improving.  She is now using a cane to get around.   Return to clinic in 1 year for follow-up   No orders of the defined types were placed in this encounter.  The patient has a good understanding of the overall plan. she agrees with it. she will call with any problems that may develop before the next visit here. Total time spent: 30 mins including face to face time and time spent for planning, charting and co-ordination of care   Tamsen Meek, MD 03/25/23

## 2023-03-29 ENCOUNTER — Ambulatory Visit: Payer: Medicare Other | Attending: Internal Medicine

## 2023-03-29 DIAGNOSIS — Z7901 Long term (current) use of anticoagulants: Secondary | ICD-10-CM

## 2023-03-29 DIAGNOSIS — I482 Chronic atrial fibrillation, unspecified: Secondary | ICD-10-CM

## 2023-03-29 LAB — POCT INR: INR: 2.7 (ref 2.0–3.0)

## 2023-03-29 NOTE — Patient Instructions (Signed)
Continue taking Warfarin 1 tablet daily except 1.5 tablets Wednesdays and Fridays.   Repeat INR in 6 weeks.  Call [phone] with any questions

## 2023-04-21 ENCOUNTER — Other Ambulatory Visit: Payer: Self-pay | Admitting: Internal Medicine

## 2023-04-21 DIAGNOSIS — G629 Polyneuropathy, unspecified: Secondary | ICD-10-CM

## 2023-04-22 ENCOUNTER — Other Ambulatory Visit: Payer: Self-pay | Admitting: Internal Medicine

## 2023-04-22 DIAGNOSIS — E876 Hypokalemia: Secondary | ICD-10-CM

## 2023-05-02 ENCOUNTER — Encounter: Payer: Medicare Other | Admitting: Internal Medicine

## 2023-05-10 ENCOUNTER — Ambulatory Visit: Payer: Medicare Other | Attending: Internal Medicine

## 2023-05-10 DIAGNOSIS — Z7901 Long term (current) use of anticoagulants: Secondary | ICD-10-CM

## 2023-05-10 DIAGNOSIS — I482 Chronic atrial fibrillation, unspecified: Secondary | ICD-10-CM | POA: Diagnosis not present

## 2023-05-10 LAB — POCT INR: INR: 1.9 — AB (ref 2.0–3.0)

## 2023-05-10 NOTE — Patient Instructions (Signed)
 Take 2 tablets today only then Continue taking Warfarin 1 tablet daily except 1.5 tablets Wednesdays and Fridays.   Repeat INR in 6 weeks.  Call [phone] with any questions

## 2023-05-23 ENCOUNTER — Other Ambulatory Visit: Payer: Self-pay | Admitting: Nurse Practitioner

## 2023-05-24 ENCOUNTER — Ambulatory Visit (INDEPENDENT_AMBULATORY_CARE_PROVIDER_SITE_OTHER): Payer: Medicare Other

## 2023-05-24 DIAGNOSIS — I495 Sick sinus syndrome: Secondary | ICD-10-CM

## 2023-05-24 LAB — CUP PACEART REMOTE DEVICE CHECK
Battery Remaining Longevity: 141 mo
Battery Voltage: 3.04 V
Brady Statistic AP VP Percent: 0 %
Brady Statistic AP VS Percent: 0 %
Brady Statistic AS VP Percent: 95.82 %
Brady Statistic AS VS Percent: 4.18 %
Brady Statistic RA Percent Paced: 0 %
Brady Statistic RV Percent Paced: 95.82 %
Date Time Interrogation Session: 20250120193353
Implantable Lead Connection Status: 753985
Implantable Lead Connection Status: 753985
Implantable Lead Implant Date: 20110428
Implantable Lead Implant Date: 20110428
Implantable Lead Location: 753859
Implantable Lead Location: 753860
Implantable Lead Model: 4092
Implantable Lead Model: 4592
Implantable Pulse Generator Implant Date: 20230424
Lead Channel Impedance Value: 456 Ohm
Lead Channel Impedance Value: 494 Ohm
Lead Channel Impedance Value: 551 Ohm
Lead Channel Impedance Value: 570 Ohm
Lead Channel Pacing Threshold Amplitude: 0.75 V
Lead Channel Pacing Threshold Pulse Width: 0.4 ms
Lead Channel Sensing Intrinsic Amplitude: 1.375 mV
Lead Channel Sensing Intrinsic Amplitude: 8.875 mV
Lead Channel Sensing Intrinsic Amplitude: 8.875 mV
Lead Channel Setting Pacing Amplitude: 2 V
Lead Channel Setting Pacing Pulse Width: 0.4 ms
Lead Channel Setting Sensing Sensitivity: 1.2 mV
Zone Setting Status: 755011

## 2023-05-28 ENCOUNTER — Encounter: Payer: Self-pay | Admitting: Cardiovascular Disease

## 2023-06-06 ENCOUNTER — Encounter: Payer: Medicare Other | Admitting: Internal Medicine

## 2023-06-21 ENCOUNTER — Other Ambulatory Visit: Payer: Self-pay | Admitting: Hematology and Oncology

## 2023-06-21 ENCOUNTER — Ambulatory Visit: Payer: Medicare Other | Attending: Cardiovascular Disease

## 2023-06-21 DIAGNOSIS — I482 Chronic atrial fibrillation, unspecified: Secondary | ICD-10-CM | POA: Diagnosis not present

## 2023-06-21 DIAGNOSIS — Z7901 Long term (current) use of anticoagulants: Secondary | ICD-10-CM | POA: Diagnosis not present

## 2023-06-21 LAB — POCT INR: INR: 2.9 (ref 2.0–3.0)

## 2023-06-21 NOTE — Patient Instructions (Signed)
 Continue taking Warfarin 1 tablet daily except 1.5 tablets Wednesdays and Fridays.   Repeat INR in 6 weeks.  Call [phone] with any questions

## 2023-06-30 ENCOUNTER — Other Ambulatory Visit: Payer: Self-pay

## 2023-06-30 MED ORDER — DILTIAZEM HCL ER COATED BEADS 300 MG PO CP24: 300.0000 mg | ORAL_CAPSULE | Freq: Every day | ORAL | 0 refills | Status: DC

## 2023-07-04 ENCOUNTER — Ambulatory Visit: Payer: Medicare Other | Admitting: Hematology and Oncology

## 2023-07-04 NOTE — Progress Notes (Signed)
 Remote pacemaker transmission.

## 2023-07-11 ENCOUNTER — Ambulatory Visit (INDEPENDENT_AMBULATORY_CARE_PROVIDER_SITE_OTHER): Admitting: Urgent Care

## 2023-07-11 ENCOUNTER — Encounter: Payer: Self-pay | Admitting: Urgent Care

## 2023-07-11 VITALS — BP 149/79 | HR 104 | Ht 63.5 in | Wt 139.8 lb

## 2023-07-11 DIAGNOSIS — I10 Essential (primary) hypertension: Secondary | ICD-10-CM | POA: Diagnosis not present

## 2023-07-11 DIAGNOSIS — E673 Hypervitaminosis D: Secondary | ICD-10-CM

## 2023-07-11 DIAGNOSIS — G2581 Restless legs syndrome: Secondary | ICD-10-CM

## 2023-07-11 DIAGNOSIS — I482 Chronic atrial fibrillation, unspecified: Secondary | ICD-10-CM | POA: Diagnosis not present

## 2023-07-11 DIAGNOSIS — R7303 Prediabetes: Secondary | ICD-10-CM | POA: Diagnosis not present

## 2023-07-11 DIAGNOSIS — C50411 Malignant neoplasm of upper-outer quadrant of right female breast: Secondary | ICD-10-CM

## 2023-07-11 DIAGNOSIS — I8312 Varicose veins of left lower extremity with inflammation: Secondary | ICD-10-CM

## 2023-07-11 DIAGNOSIS — Z950 Presence of cardiac pacemaker: Secondary | ICD-10-CM

## 2023-07-11 DIAGNOSIS — K219 Gastro-esophageal reflux disease without esophagitis: Secondary | ICD-10-CM

## 2023-07-11 DIAGNOSIS — I495 Sick sinus syndrome: Secondary | ICD-10-CM

## 2023-07-11 DIAGNOSIS — N898 Other specified noninflammatory disorders of vagina: Secondary | ICD-10-CM

## 2023-07-11 DIAGNOSIS — G629 Polyneuropathy, unspecified: Secondary | ICD-10-CM | POA: Diagnosis not present

## 2023-07-11 DIAGNOSIS — I8311 Varicose veins of right lower extremity with inflammation: Secondary | ICD-10-CM

## 2023-07-11 DIAGNOSIS — E611 Iron deficiency: Secondary | ICD-10-CM

## 2023-07-11 DIAGNOSIS — Z170 Estrogen receptor positive status [ER+]: Secondary | ICD-10-CM

## 2023-07-11 MED ORDER — PRAMIPEXOLE DIHYDROCHLORIDE 0.125 MG PO TABS
0.1250 mg | ORAL_TABLET | Freq: Every evening | ORAL | 0 refills | Status: DC
Start: 2023-07-11 — End: 2023-10-10

## 2023-07-11 MED ORDER — NYSTATIN 100000 UNIT/GM EX POWD
1.0000 | Freq: Three times a day (TID) | CUTANEOUS | 0 refills | Status: AC
Start: 2023-07-11 — End: ?

## 2023-07-11 NOTE — Patient Instructions (Addendum)
 Stop your gabapentin, your Lotrisone and your iron.  Start taking pramipexole nightly before bed to see if this helps with your leg pain.  Start using topical nystatin powder. Use this daily as needed for vaginal itching or irritation.  We drew labs to assess for the cause of your neuropathy.

## 2023-07-12 ENCOUNTER — Telehealth: Payer: Self-pay

## 2023-07-12 LAB — COMPREHENSIVE METABOLIC PANEL
ALT: 10 U/L (ref 0–35)
AST: 16 U/L (ref 0–37)
Albumin: 4.4 g/dL (ref 3.5–5.2)
Alkaline Phosphatase: 123 U/L — ABNORMAL HIGH (ref 39–117)
BUN: 31 mg/dL — ABNORMAL HIGH (ref 6–23)
CO2: 30 meq/L (ref 19–32)
Calcium: 9.7 mg/dL (ref 8.4–10.5)
Chloride: 101 meq/L (ref 96–112)
Creatinine, Ser: 0.87 mg/dL (ref 0.40–1.20)
GFR: 57.24 mL/min — ABNORMAL LOW (ref >60.00)
Glucose, Bld: 94 mg/dL (ref 70–99)
Potassium: 4.3 meq/L (ref 3.5–5.1)
Sodium: 140 meq/L (ref 135–145)
Total Bilirubin: 0.4 mg/dL (ref 0.2–1.2)
Total Protein: 6.9 g/dL (ref 6.0–8.3)

## 2023-07-12 LAB — CBC WITH DIFFERENTIAL/PLATELET
Basophils Absolute: 0.1 10*3/uL (ref 0.0–0.1)
Basophils Relative: 1.5 % (ref 0.0–3.0)
Eosinophils Absolute: 0.1 10*3/uL (ref 0.0–0.7)
Eosinophils Relative: 0.7 % (ref 0.0–5.0)
HCT: 41.1 % (ref 36.0–46.0)
Hemoglobin: 13.5 g/dL (ref 12.0–15.0)
Lymphocytes Relative: 31.1 % (ref 12.0–46.0)
Lymphs Abs: 2.3 10*3/uL (ref 0.7–4.0)
MCHC: 32.8 g/dL (ref 30.0–36.0)
MCV: 97.9 fl (ref 78.0–100.0)
Monocytes Absolute: 0.6 10*3/uL (ref 0.1–1.0)
Monocytes Relative: 7.7 % (ref 3.0–12.0)
Neutro Abs: 4.4 10*3/uL (ref 1.4–7.7)
Neutrophils Relative %: 59 % (ref 43.0–77.0)
Platelets: 224 10*3/uL (ref 150.0–400.0)
RBC: 4.2 Mil/uL (ref 3.87–5.11)
RDW: 13.3 % (ref 11.5–15.5)
WBC: 7.5 10*3/uL (ref 4.0–10.5)

## 2023-07-12 LAB — VITAMIN D 25 HYDROXY (VIT D DEFICIENCY, FRACTURES): VITD: 110.63 ng/mL (ref 30.00–100.00)

## 2023-07-12 LAB — B12 AND FOLATE PANEL
Folate: 11.7 ng/mL (ref >5.9)
Vitamin B-12: 903 pg/mL (ref 211–911)

## 2023-07-12 LAB — MAGNESIUM: Magnesium: 2.3 mg/dL (ref 1.5–2.5)

## 2023-07-12 LAB — TSH: TSH: 0.81 u[IU]/mL (ref 0.35–5.50)

## 2023-07-12 LAB — HEMOGLOBIN A1C: Hgb A1c MFr Bld: 6 % (ref 4.6–6.5)

## 2023-07-12 NOTE — Progress Notes (Signed)
 New Patient Office Visit  Subjective:  [name], female    DOB: 1927-06-08  Age: 89 y.o. MRN: [id number]  CC:  Chief Complaint  Patient presents with   Establish Care   Numbness    HPI Misty Blackwell presents to establish care. Transitioning from Dr. Michaelle Birks office.  Discussed the use of AI scribe software for clinical note transcription with the patient, who gave verbal consent to proceed.  History of Present Illness   Misty Blackwell is a 89 year old who presents for establishing care. She is accompanied by her primary caregiver, Lafonda Mosses.  She experiences significant swelling in her feet and legs, which are often swollen and sometimes appear bruised. The swelling is present most of the time and can be painful to touch. She wears larger shoes to accommodate the swelling, which sometimes worsens to the point where she cannot touch her feet due to soreness. She experiences throbbing pain that can wake her up at night. Lasix helps reduce the swelling, but she takes one tablet instead of the prescribed two due to bladder issues.  She describes her pain as sharp and sometimes feels like something is hitting her toe. She experiences intermittent sharp stabs and a sensation worse than a cramp when stretching her leg. Her legs sometimes jerk involuntarily, primarily worse at night, concerning for restless leg syndrome. Gabapentin, prescribed by her previous doctor, is not effective for her pain or sleep and causes dizziness and severe drowsiness, requiring the use of a walker in the morning. She would like to stop this medication. She uses an OTC cream that helps calm the nerves.  She takes iron supplements, not due to a deficiency, but because she had leftover vitamins from her husband. She is aware that her vitamin D levels were high last year and is concerned about the potential effects of excess iron and vitamin D.  She experiences dryness and itching in the vaginal area,  for which she uses Lotrisone cream. She wears a pad due to dribbling and changes it frequently to avoid moisture. She reports overflow incontinence and overactive bladder. She reports that the neuropathy has spread to her arms, although she does not experience pain there.  She lives independently but is supported by her daughter and son-in-law, who visit two to three times a week. She was raised on a farm and does not eat chicken due to past experiences. She used to enjoy square dancing and clogging but feels limited by her neuropathy. She cooks her own meals and does her own laundry, but her daughter helps with cleaning every other week. The HOA maintains her lawn.   She follows with cardiology due to known CAD, CHF, afib with pacer. She follows with oncology secondary to history of breast cancer.       Outpatient Encounter Medications as of 07/11/2023  Medication Sig   anastrozole (ARIMIDEX) 1 MG tablet TAKE 1 TABLET BY MOUTH DAILY   Ascorbic Acid (VITAMIN C) 1000 MG tablet Take 1,000 mg by mouth daily.    Cholecalciferol (VITAMIN D-3) 125 MCG (5000 UT) TABS Take 5,000 Units by mouth daily.   Cyanocobalamin (VITAMIN B 12 PO) Take 1,000 mcg by mouth daily.    diltiazem (CARDIZEM CD) 300 MG 24 hr capsule Take 1 capsule (300 mg total) by mouth daily.   Ferrous Sulfate 90 (18 Fe) MG TABS Take 18 mg by mouth daily with breakfast.   furosemide (LASIX) 40 MG tablet Take 1 tablet  2 x /  day for BP & Fluid Retention /Ankle Swelling   irbesartan (AVAPRO) 300 MG tablet Take  1 tablet  Daily  for BP & Heart                                                                         /                                                                   TAKE                                         BY                                                 MOUTH   Magnesium 250 MG TABS Take 250 mg by mouth daily.   nystatin (MYCOSTATIN/NYSTOP) powder Apply 1 Application topically 3 (three) times daily.   OVER THE COUNTER  MEDICATION Apply 1 application. topically See admin instructions. Neuropathy maximum strength Nerve relief and recovery cream- Apply to the feet at bedtime   potassium chloride SA (KLOR-CON M) 20 MEQ tablet TAKE 1 TABLET BY MOUTH TWICE DAILY FOR POTASSIUM   pramipexole (MIRAPEX) 0.125 MG tablet Take 1 tablet (0.125 mg total) by mouth at bedtime.   warfarin (COUMADIN) 3 MG tablet TAKE 1 TO 1.5 TABLET BY MOUTH EVERY DAY AS DIRECTED   zinc gluconate 50 MG tablet Take 50 mg by mouth daily.   [DISCONTINUED] acidophilus (RISAQUAD) CAPS capsule Take 1 capsule by mouth daily.   [DISCONTINUED] clotrimazole-betamethasone (LOTRISONE) cream Apply topically 2 (two) times daily.   [DISCONTINUED] gabapentin (NEURONTIN) 100 MG capsule TAKE 1 TO 2 CAPSULES BY MOUTH EVERY NIGHT 1 TO 2 HOURS BEFORE BEDTIME FOR DIABETIC NERVE PAIN OR SLEEP   No facility-administered encounter medications on file as of 07/11/2023.    Past Medical History:  Diagnosis Date   Anxiety    Atrial fib/flutter, transient (HCC)    Atrial fibrillation, chronic (HCC)    CHF (congestive heart failure) (HCC) 10/29/2009   Echo - EF >55%; normal LV size and systolic function; unable to assess diastolic fcn due to E/A fusion, pulmonary vein flow pattern suggests elevated filling pressure; marked biatrail dilation, mild/mod tricuspid regurgitation; mod pulmonary htn; mild/mod mitral regurgitation; although echocardiographic features are incomplete findings suggest possible infiltrative cardiomyopathy (maybe amyloidosi   Coronary artery disease 03/19/2002   R/P Cardiolite - EF 76%; nromal static and dynamic myocardial perfusion images; normal wall motion and endocardial thickening in all vascular territories   Dyspnea    Dysrhythmia    Facial numbness 12/26/2008   carotid doppler - R and L ICAs 0-49% diameter reduction (velocities suggest low end of scale)   Hypertension    Pacemaker  Peripheral neuropathy    Pneumonia 2017   Skin cancer     s/p surgical removal.   Stroke (HCC) 04/2017   TIA (transient ischemic attack)     Past Surgical History:  Procedure Laterality Date   ABDOMINAL HYSTERECTOMY     APPENDECTOMY     BREAST BIOPSY Right 11/16/2019   BREAST LUMPECTOMY Right 12/13/2019   BREAST LUMPECTOMY WITH RADIOACTIVE SEED LOCALIZATION Right 12/13/2019   Procedure: RIGHT BREAST LUMPECTOMY WITH RADIOACTIVE SEED LOCALIZATION;  Surgeon: Manus Rudd, MD;  Location: [facility] OR;  Service: General;  Laterality: Right;  LMA VS MAC   BREAST SURGERY Left 1949   CARDIAC CATHETERIZATION  08/06/2005   minimal coronary disease predominant RCA; no significant atherosclerosis; new onset sick sinus syndrome and atrial flutter w/ ventricular response, controlled on med therapy; systemic HTN, normal renal arteries   CARDIOVERSION  11/19/2009   successful DCCV from AF to sinus type rhythm   CHOLECYSTECTOMY     EYE SURGERY Bilateral 2013   PPM GENERATOR CHANGEOUT N/A 08/24/2021   Procedure: PPM GENERATOR CHANGEOUT;  Surgeon: Thurmon Fair, MD;  Location: [facility] INVASIVE CV LAB;  Service: Cardiovascular;  Laterality: N/A;   Skin cancer resection     TONSILLECTOMY  1938    Family History  Problem Relation Age of Onset   Heart disease Mother    Diabetes Mother    Heart attack Father    Heart disease Father    Cirrhosis Brother     Social History   Socioeconomic History   Marital status: Widowed    Spouse name: Not on file   Number of children: 0   Years of education: Not on file   Highest education level: 12th grade  Occupational History   Not on file  Tobacco Use   Smoking status: Never   Smokeless tobacco: Never  Vaping Use   Vaping status: Never Used  Substance and Sexual Activity   Alcohol use: No   Drug use: No   Sexual activity: Never    Birth control/protection: None  Other Topics Concern   Not on file  Social History Narrative   Widowed.  Lives alone.  Ambulates independently.   Social Drivers of Manufacturing engineer Strain: Low Risk  (07/10/2023)   Overall Financial Resource Strain (CARDIA)    Difficulty of Paying Living Expenses: Not hard at all  Food Insecurity: No Food Insecurity (07/10/2023)   Hunger Vital Sign    Worried About Running Out of Food in the Last Year: Never true    Ran Out of Food in the Last Year: Never true  Transportation Needs: No Transportation Needs (07/10/2023)   PRAPARE - Administrator, Civil Service (Medical): No    Lack of Transportation (Non-Medical): No  Physical Activity: Unknown (07/10/2023)   Exercise Vital Sign    Days of Exercise per Week: 0 days    Minutes of Exercise per Session: Not on file  Stress: Stress Concern Present (07/10/2023)   [employer] of Occupational Health - Occupational Stress Questionnaire    Feeling of Stress : To some extent  Social Connections: Moderately Integrated (07/10/2023)   Social Connection and Isolation Panel [NHANES]    Frequency of Communication with Friends and Family: Three times a week    Frequency of Social Gatherings with Friends and Family: Twice a week    Attends Religious Services: More than 4 times per year    Active Member of Clubs or Organizations: Yes  Attends Banker Meetings: More than 4 times per year    Marital Status: Widowed  Catering manager Violence: Not on file    ROS: as noted in HPI  Objective:  BP (!) 149/79   Pulse (!) 104   Ht 5' 3.5" (1.613 m)   Wt 139 lb 12 oz (63.4 kg)   SpO2 98%   BMI 24.37 kg/m   Physical Exam Vitals and nursing note reviewed. Exam conducted with a chaperone present.  Constitutional:      General: She is not in acute distress.    Appearance: Normal appearance. She is not ill-appearing, toxic-appearing or diaphoretic.  HENT:     Head: Normocephalic and atraumatic.     Right Ear: Tympanic membrane, ear canal and external ear normal. There is no impacted cerumen.     Left Ear: Tympanic membrane, ear canal and external ear  normal. There is no impacted cerumen.     Nose: Nose normal.     Mouth/Throat:     Mouth: Mucous membranes are moist.     Pharynx: Oropharynx is clear. No oropharyngeal exudate or posterior oropharyngeal erythema.  Eyes:     General: No scleral icterus.       Right eye: No discharge.        Left eye: No discharge.     Extraocular Movements: Extraocular movements intact.     Pupils: Pupils are equal, round, and reactive to light.  Neck:     Thyroid: No thyroid mass, thyromegaly or thyroid tenderness.  Cardiovascular:     Rate and Rhythm: Normal rate and regular rhythm.     Heart sounds: No murmur heard. Pulmonary:     Effort: Pulmonary effort is normal. No respiratory distress.     Breath sounds: Normal breath sounds. No stridor. No wheezing or rhonchi.  Musculoskeletal:        General: Tenderness present.     Cervical back: Normal range of motion and neck supple. No rigidity or tenderness.     Right lower leg: Edema present.     Left lower leg: Edema present.     Comments: B feet and ankles with mild to moderate non-pitting edema, mildly tender to touch. Negative homan sign. Significant inflamed varicosities noted to bilateral feet and shins  Lymphadenopathy:     Cervical: No cervical adenopathy.  Skin:    General: Skin is warm and dry.     Coloration: Skin is not jaundiced.     Findings: No bruising, erythema or rash.  Neurological:     General: No focal deficit present.     Mental Status: She is alert and oriented to person, place, and time.  Psychiatric:        Mood and Affect: Mood normal.        Behavior: Behavior normal.     Last CBC Lab Results  Component Value Date   WBC 7.7 02/28/2023   HGB 13.0 02/28/2023   HCT 40.9 02/28/2023   MCV 98.8 02/28/2023   MCH 31.4 02/28/2023   RDW 11.4 02/28/2023   PLT 239 02/28/2023   Last metabolic panel Lab Results  Component Value Date   GLUCOSE 97 02/28/2023   NA 142 02/28/2023   K 5.4 (H) 02/28/2023   CL 105  02/28/2023   CO2 30 02/28/2023   BUN 25 02/28/2023   CREATININE 0.74 02/28/2023   EGFR 75 02/28/2023   CALCIUM 10.0 02/28/2023   PHOS 2.6 10/03/2021   PROT 6.9 02/28/2023   ALBUMIN  3.3 (L) 10/03/2021   BILITOT 0.7 02/28/2023   ALKPHOS 90 10/03/2021   AST 15 02/28/2023   ALT 10 02/28/2023   ANIONGAP 7 10/04/2021   Last lipids Lab Results  Component Value Date   CHOL 167 02/28/2023   HDL 45 (L) 02/28/2023   LDLCALC 90 02/28/2023   TRIG 228 (H) 02/28/2023   CHOLHDL 3.7 02/28/2023   Last hemoglobin A1c Lab Results  Component Value Date   HGBA1C 5.7 (H) 02/28/2023   Last thyroid functions Lab Results  Component Value Date   TSH 1.69 04/29/2022   Last vitamin D Lab Results  Component Value Date   VD25OH 115 (H) 11/22/2022   Last vitamin B12 and Folate Lab Results  Component Value Date   VITAMINB12 1,294 (H) 09/28/2016      Assessment & Plan:  Prediabetes -     Hemoglobin A1c  SSS (sick sinus syndrome) (HCC)  Essential hypertension -     Comprehensive metabolic panel -     CBC with Differential/Platelet -     TSH  Gastroesophageal reflux disease without esophagitis -     B12 and Folate Panel  Malignant neoplasm of upper-outer quadrant of right breast in female, estrogen receptor positive (HCC)  Pacemaker  Chronic atrial fibrillation (HCC)  High vitamin D level -     VITAMIN D 25 Hydroxy (Vit-D Deficiency, Fractures)  Iron deficiency -     Iron, TIBC and Ferritin Panel  Restless leg syndrome -     Pramipexole Dihydrochloride; Take 1 tablet (0.125 mg total) by mouth at bedtime.  Dispense: 90 tablet; Refill: 0  Varicose veins of both lower extremities with inflammation  Vaginal itching -     Nystatin; Apply 1 Application topically 3 (three) times daily.  Dispense: 15 g; Refill: 0  Neuropathy -     B12 and Folate Panel -     Iron, TIBC and Ferritin Panel -     Vitamin B1 -     TSH -     Zinc -     Magnesium  Assessment and Plan     Peripheral Neuropathy Chronic pain, swelling, and restlessness in lower extremities. Gabapentin ineffective and causing dizziness. Possible restless leg syndrome. -Discontinue Gabapentin. -Start Pramipexole for restless leg syndrome. -Check Vitamin B12 and Thiamine levels to assess for deficiencies causing neuropathy.  Varicose Veins Significant varicosities noted, likely contributing to leg swelling and discomfort. Patient declined referral to vein specialist at this time. -Continue Lasix as needed for swelling.  Urinary Incontinence Chronic issue with dribbling and moisture, causing discomfort and itching in the vulvar area. -Discontinue Clotrimazole-Betamethasone cream. -Start antifungal powder for moisture control.  Vitamin/Mineral Supplementation Patient taking multiple supplements including Vitamin D and Iron. Last Vitamin D level was high. -Check Vitamin D and Iron levels to assess for over-supplementation. -Discuss with Cardiologist about possible switch from Coumadin to newer anticoagulant - pt concerned about not being able to eat what she would like due to interactions with warfarin  Follow-up in 3-4 weeks to review blood work and assess effectiveness of medication changes.        Return in about 3 weeks (around 08/01/2023).   Maretta Bees, PA

## 2023-07-12 NOTE — Telephone Encounter (Signed)
 [facility] called with a critical vitamin D for pt of 110.63

## 2023-07-13 ENCOUNTER — Encounter: Payer: Self-pay | Admitting: Urgent Care

## 2023-07-13 LAB — ZINC: Zinc: 83 ug/dL (ref 60–130)

## 2023-07-13 NOTE — Telephone Encounter (Signed)
Pt and niece notified.

## 2023-07-13 NOTE — Telephone Encounter (Signed)
 Yes. She just needs to stop taking her OTC supplement. Thanks

## 2023-07-14 LAB — IRON,TIBC AND FERRITIN PANEL
%SAT: 45 % (ref 16–45)
Ferritin: 91 ng/mL (ref 16–288)
Iron: 156 ug/dL (ref 45–160)
TIBC: 349 ug/dL (ref 250–450)

## 2023-07-14 LAB — VITAMIN B1: Vitamin B1 (Thiamine): 7 nmol/L — ABNORMAL LOW (ref 8–30)

## 2023-07-15 ENCOUNTER — Encounter: Payer: Self-pay | Admitting: Urgent Care

## 2023-07-15 ENCOUNTER — Telehealth: Payer: Self-pay

## 2023-07-15 DIAGNOSIS — E5111 Dry beriberi: Secondary | ICD-10-CM

## 2023-07-15 MED ORDER — VITAMIN B-1 50 MG PO TABS
50.0000 mg | ORAL_TABLET | Freq: Every day | ORAL | 0 refills | Status: AC
Start: 2023-07-15 — End: ?

## 2023-07-15 NOTE — Telephone Encounter (Addendum)
  Communication  Reason for CRM: Patient is returning call to [facility] regarding lab results, please reach back out to patient, thanks.            [place] [phone]                Pt niece given results.

## 2023-07-18 ENCOUNTER — Inpatient Hospital Stay: Payer: Medicare Other | Attending: Hematology and Oncology | Admitting: Hematology and Oncology

## 2023-07-18 VITALS — BP 152/60 | HR 72 | Temp 98.3°F | Resp 18 | Ht 63.5 in | Wt 149.0 lb

## 2023-07-18 DIAGNOSIS — C50411 Malignant neoplasm of upper-outer quadrant of right female breast: Secondary | ICD-10-CM

## 2023-07-18 DIAGNOSIS — Z79811 Long term (current) use of aromatase inhibitors: Secondary | ICD-10-CM | POA: Insufficient documentation

## 2023-07-18 DIAGNOSIS — G629 Polyneuropathy, unspecified: Secondary | ICD-10-CM | POA: Insufficient documentation

## 2023-07-18 DIAGNOSIS — Z170 Estrogen receptor positive status [ER+]: Secondary | ICD-10-CM | POA: Diagnosis not present

## 2023-07-18 MED ORDER — ANASTROZOLE 1 MG PO TABS: 1.0000 mg | ORAL_TABLET | Freq: Every day | ORAL | 3 refills | Status: DC

## 2023-07-18 NOTE — Assessment & Plan Note (Signed)
 12/13/19: Right lumpectomy (Tsuei): invasive lobular carcinoma, grade 2, 2.4cm, clear margins.  ER 80%, PR 50%, HER-2 negative, Ki-67 5%   Treatment plan: Anastrozole adjuvant therapy 1 mg daily x5 years started September 2021 Anastrozole toxicities: Hot flashes have resolved. Otherwise tolerating it extremely well.    Breast cancer surveillance: Mammogram 12/21/2022.  Right breast possible asymmetry/architectural distortion, diagnostic mammogram and targeted ultrasound 0.5 cm (benign cysts) Breast exam 07/18/2023: Benign   She likes to travel around with her church group to hear [place] speakers. Chronic peripheral neuropathy: Improving.  She is now using a cane to get around.   Return to clinic in 1 year for follow-up

## 2023-07-18 NOTE — Progress Notes (Signed)
 Patient Care Team: Maretta Bees, [place] as PCP - General (Physician Assistant) Thurmon Fair, MD as PCP - Cardiology (Cardiology) Barron Alvine, MD (Inactive) as Consulting Physician (Urology) Suanne Marker, MD as Consulting Physician (Neurology) Micki Riley, MD as Consulting Physician (Neurology) [name], Rachelle Hora, MD as Consulting Physician (Cardiology) Louis Meckel, MD (Inactive) as Consulting Physician (Gastroenterology) Pershing Proud, RN as Oncology Nurse Navigator Donnelly Angelica, RN as Oncology Nurse Navigator  DIAGNOSIS:  Encounter Diagnosis  Name Primary?   Malignant neoplasm of upper-outer quadrant of right breast in female, estrogen receptor positive (HCC) Yes    SUMMARY OF ONCOLOGIC HISTORY: Oncology History  Malignant neoplasm of upper-outer quadrant of right breast in female, estrogen receptor positive (HCC)  11/15/2019 Cancer Staging   Staging form: Breast, AJCC 8th Edition - Clinical stage from 11/15/2019: Stage IA (cT1c, cN0, cM0, G2, ER+, PR+, HER2-) - Signed by Loa Socks, NP on 11/21/2019   11/15/2019 Initial Biopsy   Palpable right breast mass: 1.5 cm at 12 o'clock position, benign intramammary lymph node, axilla negative, right breast biopsy 1 grade 2 ER 80%, PR 50%, Ki-67 5%, HER-2 negative   12/13/2019 Surgery   Right lumpectomy (Tsuei): invasive lobular carcinoma, grade 2, 2.4cm, clear margins.      CHIEF COMPLIANT: Follow-up on anastrozole therapy  HISTORY OF PRESENT ILLNESS:  History of Present Illness The patient, with a history of breast cancer, presents for her annual follow-up. She reports feeling "fantastic" and has been adhering to her prescribed regimen of anastrozole. She has one more year left on this treatment. She has had no breast pain or discomfort and her last mammogram and ultrasound in September showed no abnormalities, only some normal cysts. She is due for another mammogram this coming September.  The  patient also reports a change in her primary care provider following the passing of her previous doctor. Her new doctor, Dr. Garnet Sierras, has made some adjustments to her medication regimen, including reducing her dosage of Vitamin D due to high levels in her system. However, she continues to take Vitamin D as recommended by her cardiologist.  The patient remains active, participating in church trips and other activities. She did not attend a week-long event last year due to difficulties with walking, but is considering attending this year.     ALLERGIES:  is allergic to latex, ace inhibitors, augmentin [amoxicillin-pot clavulanate], ciprofloxacin, levaquin [levofloxacin in d5w], zocor [simvastatin], acrylic polymer [carbomer], and chocolate.  MEDICATIONS:  Current Outpatient Medications  Medication Sig Dispense Refill   anastrozole (ARIMIDEX) 1 MG tablet Take 1 tablet (1 mg total) by mouth daily. 90 tablet 3   Ascorbic Acid (VITAMIN C) 1000 MG tablet Take 1,000 mg by mouth daily.      Cholecalciferol (VITAMIN D-3) 125 MCG (5000 UT) TABS Take 5,000 Units by mouth daily.     Cyanocobalamin (VITAMIN B 12 PO) Take 1,000 mcg by mouth daily.      diltiazem (CARDIZEM CD) 300 MG 24 hr capsule Take 1 capsule (300 mg total) by mouth daily. 90 capsule 0   Ferrous Sulfate 90 (18 Fe) MG TABS Take 18 mg by mouth daily with breakfast.     furosemide (LASIX) 40 MG tablet Take 1 tablet  2 x /day for BP & Fluid Retention /Ankle Swelling 180 tablet 3   irbesartan (AVAPRO) 300 MG tablet Take  1 tablet  Daily  for BP & Heart                                                                         /  TAKE                                         BY                                                 MOUTH 90 tablet 3   Magnesium 250 MG TABS Take 250 mg by mouth daily.     nystatin (MYCOSTATIN/NYSTOP) powder Apply 1 Application topically 3 (three) times  daily. 15 g 0   OVER THE COUNTER MEDICATION Apply 1 application. topically See admin instructions. Neuropathy maximum strength Nerve relief and recovery cream- Apply to the feet at bedtime     potassium chloride SA (KLOR-CON M) 20 MEQ tablet TAKE 1 TABLET BY MOUTH TWICE DAILY FOR POTASSIUM 180 tablet 3   pramipexole (MIRAPEX) 0.125 MG tablet Take 1 tablet (0.125 mg total) by mouth at bedtime. 90 tablet 0   thiamine (VITAMIN B-1) 50 MG tablet Take 1 tablet (50 mg total) by mouth daily. 90 tablet 0   warfarin (COUMADIN) 3 MG tablet TAKE 1 TO 1.5 TABLET BY MOUTH EVERY DAY AS DIRECTED 135 tablet 1   zinc gluconate 50 MG tablet Take 50 mg by mouth daily.     No current facility-administered medications for this visit.    PHYSICAL EXAMINATION: ECOG PERFORMANCE STATUS: 1 - Symptomatic but completely ambulatory  Vitals:   07/18/23 1120  BP: (!) 152/60  Pulse: 72  Resp: 18  Temp: 98.3 F (36.8 C)  SpO2: 97%   Filed Weights   07/18/23 1120  Weight: 149 lb (67.6 kg)     LABORATORY DATA:  I have reviewed the data as listed    Latest Ref Rng & Units 07/11/2023    1:53 PM 02/28/2023   11:20 AM 11/22/2022   11:23 AM  CMP  Glucose 70 - 99 mg/dL 94  97  90   BUN 6 - 23 mg/dL 31  25  23    Creatinine 0.40 - 1.20 mg/dL 4.40  1.02  7.25   Sodium 135 - 145 mEq/L 140  142  140   Potassium 3.5 - 5.1 mEq/L 4.3  5.4  4.7   Chloride 96 - 112 mEq/L 101  105  101   CO2 19 - 32 mEq/L 30  30  29    Calcium 8.4 - 10.5 mg/dL 9.7  36.6  9.8   Total Protein 6.0 - 8.3 g/dL 6.9  6.9  7.1   Total Bilirubin 0.2 - 1.2 mg/dL 0.4  0.7  0.5   Alkaline Phos 39 - 117 U/L 123     AST 0 - 37 U/L 16  15  16    ALT 0 - 35 U/L 10  10  11      Lab Results  Component Value Date   WBC 7.5 07/11/2023   HGB 13.5 07/11/2023   HCT 41.1 07/11/2023   MCV 97.9 07/11/2023   PLT 224.0 07/11/2023   NEUTROABS 4.4 07/11/2023    ASSESSMENT & PLAN:  Malignant neoplasm of upper-outer quadrant of right breast in female,  estrogen receptor positive (HCC) 12/13/19: Right lumpectomy (Tsuei): invasive lobular carcinoma, grade 2, 2.4cm, clear margins.  ER 80%, PR 50%, HER-2 negative, Ki-67 5%   Treatment plan: Anastrozole adjuvant therapy  1 mg daily x5 years started September 2021 Anastrozole toxicities: Hot flashes have resolved. Otherwise tolerating it extremely well.    Breast cancer surveillance: Mammogram 12/21/2022.  Right breast possible asymmetry/architectural distortion, diagnostic mammogram and targeted ultrasound 0.5 cm (benign cysts) Breast exam 07/18/2023: Benign   She likes to travel around with her church group to hear [place] speakers. Chronic peripheral neuropathy: Improving.  She is now using a cane to get around.   Return to clinic in 1 year for follow-up ------------------------------------- Assessment and Plan Assessment & Plan Malignant neoplasm of upper-outer quadrant of right breast, estrogen receptor positive Undergoing anastrozole therapy with one year remaining. Recent ultrasound normal, benign cysts. Mammogram scheduled for September. Completion of anastrozole therapy expected to conclude treatment. - Continue anastrozole 1 mg orally daily for one more year. - Ensure pharmacy provides a year's worth of anastrozole refills. - Schedule a mammogram in September. - Plan for final visit next year to discontinue anastrozole.  Chronic peripheral neuropathy Experiences difficulty walking, impacting travel. Medication adjustment by primary care improved sleep quality. - Continue current medication regimen as adjusted by primary care physician.  Goals of Care Values longevity and good health, prioritizes travel and church activities. - Encourage continued engagement in activities that bring joy and fulfillment.  Follow-up Needs to schedule final visit for breast cancer treatment next year. - Schedule next year's appointment with the scheduler.      No orders of the defined types  were placed in this encounter.  The patient has a good understanding of the overall plan. she agrees with it. she will call with any problems that may develop before the next visit here. Total time spent: 30 mins including face to face time and time spent for planning, charting and co-ordination of care   Tamsen Meek, MD 07/18/23

## 2023-08-02 ENCOUNTER — Ambulatory Visit: Payer: Medicare Other | Attending: Cardiology

## 2023-08-02 ENCOUNTER — Ambulatory Visit (INDEPENDENT_AMBULATORY_CARE_PROVIDER_SITE_OTHER): Admitting: Urgent Care

## 2023-08-02 ENCOUNTER — Encounter: Payer: Self-pay | Admitting: Urgent Care

## 2023-08-02 VITALS — BP 153/81 | HR 75 | Wt 148.0 lb

## 2023-08-02 DIAGNOSIS — E673 Hypervitaminosis D: Secondary | ICD-10-CM

## 2023-08-02 DIAGNOSIS — Z7901 Long term (current) use of anticoagulants: Secondary | ICD-10-CM

## 2023-08-02 DIAGNOSIS — N898 Other specified noninflammatory disorders of vagina: Secondary | ICD-10-CM

## 2023-08-02 DIAGNOSIS — I10 Essential (primary) hypertension: Secondary | ICD-10-CM | POA: Diagnosis not present

## 2023-08-02 DIAGNOSIS — E5111 Dry beriberi: Secondary | ICD-10-CM | POA: Diagnosis not present

## 2023-08-02 DIAGNOSIS — I482 Chronic atrial fibrillation, unspecified: Secondary | ICD-10-CM

## 2023-08-02 DIAGNOSIS — I8311 Varicose veins of right lower extremity with inflammation: Secondary | ICD-10-CM

## 2023-08-02 DIAGNOSIS — R7303 Prediabetes: Secondary | ICD-10-CM

## 2023-08-02 DIAGNOSIS — I8312 Varicose veins of left lower extremity with inflammation: Secondary | ICD-10-CM

## 2023-08-02 DIAGNOSIS — R748 Abnormal levels of other serum enzymes: Secondary | ICD-10-CM

## 2023-08-02 DIAGNOSIS — G2581 Restless legs syndrome: Secondary | ICD-10-CM

## 2023-08-02 LAB — POCT INR: INR: 1.9 — AB (ref 2.0–3.0)

## 2023-08-02 MED ORDER — TRIAMCINOLONE ACETONIDE 0.1 % EX CREA
1.0000 | TOPICAL_CREAM | Freq: Two times a day (BID) | CUTANEOUS | 0 refills | Status: AC
Start: 2023-08-02 — End: ?

## 2023-08-02 NOTE — Patient Instructions (Signed)
 Take 1.5 tablets today only then Continue taking Warfarin 1 tablet daily except 1.5 tablets Wednesdays and Fridays.   Repeat INR in 3 weeks.  Call [phone] with any questions

## 2023-08-02 NOTE — Progress Notes (Unsigned)
   Established Patient Office Visit  Subjective:  Patient ID: Larose Batres, female    DOB: 25-Jan-1928  Age: 89 y.o. MRN: [id number]  Chief Complaint  Patient presents with   Follow-up    3 week follow up. Pt states the medication for restless leg has been working.    HPI  {History (Optional):23778}  ROS: as noted in HPI  Objective:     BP (!) 178/75   Pulse 75   Wt 148 lb (67.1 kg)   SpO2 96%   BMI 25.81 kg/m  {Vitals History (Optional):23777}  Physical Exam   Results for orders placed or performed in visit on 08/02/23  POCT INR  Result Value Ref Range   INR 1.9 (A) 2.0 - 3.0   POC INR      {Labs (Optional):23779}  The ASCVD Risk score (Arnett DK, et al., 2019) failed to calculate for the following reasons:   The 2019 ASCVD risk score is only valid for ages 58 to 39   Risk score cannot be calculated because patient has a medical history suggesting prior/existing ASCVD  Assessment & Plan:  Thiamine deficiency neuropathy  Prediabetes  Essential hypertension  Chronic atrial fibrillation (HCC)  High vitamin D level  Restless leg syndrome  Varicose veins of both lower extremities with inflammation  Vaginal itching -     Triamcinolone Acetonide; Apply 1 Application topically 2 (two) times daily. Do not exceed 14 consecutive days  Dispense: 60 g; Refill: 0     No follow-ups on file.   Maretta Bees, PA

## 2023-08-02 NOTE — Patient Instructions (Addendum)
 Continue taking your Thiamine (vitamin B1) Continue your pramipexole nightly.  STOP your Vit B12 and Vit D supplements. We will recheck these in a few months.  Stop your lotrisone cream in the vaginal region and change to triamcinolone. You can continue using the powder as well.  Please return in 3 months for follow up and recheck labs.

## 2023-08-03 ENCOUNTER — Encounter: Payer: Self-pay | Admitting: Urgent Care

## 2023-08-23 ENCOUNTER — Ambulatory Visit: Payer: Medicare Other

## 2023-08-23 ENCOUNTER — Ambulatory Visit: Attending: Cardiology | Admitting: *Deleted

## 2023-08-23 DIAGNOSIS — I482 Chronic atrial fibrillation, unspecified: Secondary | ICD-10-CM

## 2023-08-23 DIAGNOSIS — Z7901 Long term (current) use of anticoagulants: Secondary | ICD-10-CM

## 2023-08-23 DIAGNOSIS — I495 Sick sinus syndrome: Secondary | ICD-10-CM | POA: Diagnosis not present

## 2023-08-23 LAB — POCT INR: INR: 1.8 — AB (ref 2.0–3.0)

## 2023-08-23 NOTE — Patient Instructions (Addendum)
 Description   Take 1.5 tablets today then START taking Warfarin 1 tablet daily except 1.5 tablets Mondays, Wednesdays and Fridays.   Repeat INR in 3 weeks.  Call [phone] with any questions

## 2023-08-24 LAB — CUP PACEART REMOTE DEVICE CHECK
Battery Remaining Longevity: 138 mo
Battery Voltage: 3.03 V
Brady Statistic AP VP Percent: 0 %
Brady Statistic AP VS Percent: 0 %
Brady Statistic AS VP Percent: 95.26 %
Brady Statistic AS VS Percent: 4.74 %
Brady Statistic RA Percent Paced: 0 %
Brady Statistic RV Percent Paced: 95.26 %
Date Time Interrogation Session: 20250422005610
Implantable Lead Connection Status: 753985
Implantable Lead Connection Status: 753985
Implantable Lead Implant Date: 20110428
Implantable Lead Implant Date: 20110428
Implantable Lead Location: 753859
Implantable Lead Location: 753860
Implantable Lead Model: 4092
Implantable Lead Model: 4592
Implantable Pulse Generator Implant Date: 20230424
Lead Channel Impedance Value: 456 Ohm
Lead Channel Impedance Value: 494 Ohm
Lead Channel Impedance Value: 570 Ohm
Lead Channel Impedance Value: 589 Ohm
Lead Channel Pacing Threshold Amplitude: 0.75 V
Lead Channel Pacing Threshold Pulse Width: 0.4 ms
Lead Channel Sensing Intrinsic Amplitude: 1.375 mV
Lead Channel Sensing Intrinsic Amplitude: 10.375 mV
Lead Channel Sensing Intrinsic Amplitude: 10.375 mV
Lead Channel Setting Pacing Amplitude: 2 V
Lead Channel Setting Pacing Pulse Width: 0.4 ms
Lead Channel Setting Sensing Sensitivity: 1.2 mV
Zone Setting Status: 755011

## 2023-09-03 ENCOUNTER — Encounter: Payer: Self-pay | Admitting: Cardiovascular Disease

## 2023-09-04 ENCOUNTER — Encounter: Payer: Self-pay | Admitting: Urgent Care

## 2023-09-10 ENCOUNTER — Encounter: Payer: Self-pay | Admitting: Urgent Care

## 2023-09-12 ENCOUNTER — Other Ambulatory Visit: Payer: Self-pay

## 2023-09-12 ENCOUNTER — Ambulatory Visit: Attending: Cardiovascular Disease

## 2023-09-12 DIAGNOSIS — Z7901 Long term (current) use of anticoagulants: Secondary | ICD-10-CM | POA: Diagnosis not present

## 2023-09-12 DIAGNOSIS — I482 Chronic atrial fibrillation, unspecified: Secondary | ICD-10-CM

## 2023-09-12 DIAGNOSIS — I10 Essential (primary) hypertension: Secondary | ICD-10-CM

## 2023-09-12 LAB — POCT INR: INR: 2.1 (ref 2.0–3.0)

## 2023-09-12 MED ORDER — IRBESARTAN 300 MG PO TABS
ORAL_TABLET | ORAL | 1 refills | Status: AC
Start: 2023-09-12 — End: ?

## 2023-09-12 NOTE — Patient Instructions (Signed)
 Continue taking Warfarin 1 tablet daily except 1.5 tablets Mondays, Wednesdays and Fridays.   Repeat INR in 6 weeks.  Call [phone] with any questions

## 2023-09-16 ENCOUNTER — Telehealth: Payer: Self-pay | Admitting: Cardiovascular Disease

## 2023-09-16 NOTE — Telephone Encounter (Signed)
 Pt c/o BP issue: STAT if pt c/o blurred vision, one-sided weakness or slurred speech.  STAT if BP is GREATER than 180/120 TODAY.  STAT if BP is LESS than 90/60 and SYMPTOMATIC TODAY  1. What is your BP concern? Pt daughter called in stating pt bp has been going up and down   2. Have you taken any BP medication today? Yes   3. What are your last 5 BP readings? 5/14: 133/84          144/71          156/74           152/74           145/68           149/57 5/15: 129/59  5/16: 152/77 hr 107    4. Are you having any other symptoms (ex. Dizziness, headache, blurred vision, passed out)? Fatigue, sob at times (not currently)

## 2023-09-16 NOTE — Telephone Encounter (Signed)
 Between now and that follow up visit, please increase the diltiazem  to 360 mg once daily (will need a new Rx please).

## 2023-09-16 NOTE — Telephone Encounter (Signed)
 Patient identification verified by 2 forms. Sims Duck, RN     Called and spoke to patient's daughter Franklin Ito.  Ave Leisure states:  -  SOB intermittently, dizziness, chest pressure,  - high blood pressure had been going on at least two weeks. - readings are after taking medications each morning. -Bp trend provided: 5/14: 133/84          144/71          156/74           152/74           145/68           149/57 5/15: 129/59  5/16: 152/77 hr 107   Diana denies:    - no headaches, vision changes, swelling, one-side muscle weakness   Interventions/Plan: - Encounter forwarded to primary cardiologist for review/recommendations.  - patient scheduled for APP OV in June/ waitlist  Reviewed ED warning signs/precautions  Patient agrees with plan, no questions at this time

## 2023-09-22 NOTE — Telephone Encounter (Signed)
 Left message for pt to call.

## 2023-09-30 MED ORDER — DILTIAZEM HCL ER COATED BEADS 360 MG PO CP24: 360.0000 mg | ORAL_CAPSULE | Freq: Every day | ORAL | 3 refills | Status: AC

## 2023-09-30 NOTE — Telephone Encounter (Signed)
 Patient identification verified by 2 forms. Sims Duck, RN     Called patient. No answer. Per DPR left detailed vm regarding medication changes. New prescription for diltizem 360mg  once daily sent to pharmacy on file.

## 2023-10-06 NOTE — Progress Notes (Signed)
 Remote pacemaker transmission.

## 2023-10-06 NOTE — Addendum Note (Signed)
 Addended by: Lott Rouleau A on: 10/06/2023 02:56 PM   Modules accepted: Orders

## 2023-10-10 ENCOUNTER — Other Ambulatory Visit: Payer: Self-pay

## 2023-10-10 ENCOUNTER — Encounter: Payer: Self-pay | Admitting: Urgent Care

## 2023-10-10 DIAGNOSIS — G2581 Restless legs syndrome: Secondary | ICD-10-CM

## 2023-10-10 MED ORDER — PRAMIPEXOLE DIHYDROCHLORIDE 0.125 MG PO TABS: 0.1250 mg | ORAL_TABLET | Freq: Every evening | ORAL | 1 refills | Status: DC

## 2023-10-21 ENCOUNTER — Encounter: Payer: Self-pay | Admitting: Emergency Medicine

## 2023-10-21 ENCOUNTER — Ambulatory Visit: Attending: Emergency Medicine | Admitting: Emergency Medicine

## 2023-10-21 VITALS — BP 142/84 | HR 75 | Ht 64.0 in | Wt 143.0 lb

## 2023-10-21 DIAGNOSIS — I4821 Permanent atrial fibrillation: Secondary | ICD-10-CM

## 2023-10-21 DIAGNOSIS — I10 Essential (primary) hypertension: Secondary | ICD-10-CM | POA: Diagnosis not present

## 2023-10-21 DIAGNOSIS — E782 Mixed hyperlipidemia: Secondary | ICD-10-CM

## 2023-10-21 DIAGNOSIS — I5032 Chronic diastolic (congestive) heart failure: Secondary | ICD-10-CM

## 2023-10-21 DIAGNOSIS — Z950 Presence of cardiac pacemaker: Secondary | ICD-10-CM

## 2023-10-21 LAB — BASIC METABOLIC PANEL WITH GFR
BUN/Creatinine Ratio: 33 — ABNORMAL HIGH (ref 12–28)
BUN: 26 mg/dL (ref 10–36)
CO2: 24 mmol/L (ref 20–29)
Calcium: 9.5 mg/dL (ref 8.7–10.3)
Chloride: 104 mmol/L (ref 96–106)
Creatinine, Ser: 0.8 mg/dL (ref 0.57–1.00)
Glucose: 90 mg/dL (ref 70–99)
Potassium: 4.3 mmol/L (ref 3.5–5.2)
Sodium: 143 mmol/L (ref 134–144)
eGFR: 68 mL/min/{1.73_m2} (ref >59)

## 2023-10-21 MED ORDER — FUROSEMIDE 40 MG PO TABS: 40.0000 mg | ORAL_TABLET | Freq: Every day | ORAL | 1 refills | Status: DC

## 2023-10-21 MED ORDER — AMLODIPINE BESYLATE 5 MG PO TABS: 5.0000 mg | ORAL_TABLET | Freq: Every day | ORAL | 1 refills | Status: DC

## 2023-10-21 NOTE — Patient Instructions (Addendum)
 Medication Instructions:   START LASIX  40 MG DAILY.   Lab Work: BMET TO BE DONE TODAY.   Testing/Procedures: NONE  Follow-Up: At [facility], you and your health needs are our priority.  As part of our continuing mission to provide you with exceptional heart care, our providers are all part of one team.  This team includes your primary Cardiologist (physician) and Advanced Practice Providers or APPs (Physician Assistants and Nurse Practitioners) who all work together to provide you with the care you need, when you need it.  Your next appointment:   3 MONTHS  Provider:   Jerel Balding, MD

## 2023-10-21 NOTE — Progress Notes (Addendum)
 Cardiology Office Note:    Date:  10/31/2023  [name], DOB 09/07/1927, MRN [id number] PCP: Lowella Benton CROME, PA  [facility] Providers Cardiologist:  Jerel Balding, MD       Patient Profile:       Chief Complaint: Acute visit for hypertension History of Present Illness:  Misty Blackwell is a 89 y.o. female with visit-pertinent history of atrial fibrillation with slow ventricular response, status post single-chamber permanent pacemaker, remote CVA, hypertension, nonobstructive CAD, asymptomatic NSVT, diastolic heart failure  In 2016 she underwent echocardiogram and nuclear stress testing.  Left ventricular function was normal and she had a fixed moderate apical defect but without reversible ischemia.  Moderate tricuspid regurgitation and moderate pulmonary artery hypertension with estimated systolic PA pressure 58 mmHg.  In April 2019 she had nuclear stress test showing normal perfusion and an echocardiogram which showed unchanged findings of mild LVH, normal left ventricular systolic function, mild left atrial dilation, improved degree of pulmonary hypertension at 41 mmHg.  Echocardiogram performed while she was hospitalized for heart failure exacerbation 2022 showed normal left ventricular systolic function and was interpreted as showing pseudonormal filling (diastolic dysfunction could not be evaluated the patient is in atrial fibrillation).  Patient was last seen in office on 12/09/2022.  Overall she was doing well she did have any acute cardiovascular concerns or complaint.  She has a follow-up in 1 year.  On 09/16/2023 she had called nurse triage line reporting that her blood pressure.  Her diltiazem  was increased to 360 mg daily.   Discussed the use of AI scribe software for clinical note transcription with the patient, who gave verbal consent to proceed.  History of Present Illness Misty Blackwell is a 89 year old female with hypertension who presents  with elevated blood pressure.  She comes in with her caregiver.  Her home blood pressure readings fluctuate significantly, with a high of nearly 190 mmHg and a low of 116 mmHg. The average is in the 150s/70s, higher than her usual 127-132 mmHg range, and has been increasing over the past year.  She she has been experiencing mild shortness of breath over the past month, particularly, causing anxiety. There is no associated chest pain, orthopnea, PND, leg swelling.  Her medication regimen includes Lasix , which she adjusts due to frequent urination.  She was previously on Lasix  40 mg twice daily however has only been taking her Lasix  20 mg daily.  Her weight has been stable.  She denies any lightheadedness, dizziness, syncope, presyncope.   Review of systems:  Please see the history of present illness. All other systems are reviewed and otherwise negative.      Studies Reviewed:    EKG Interpretation Date/Time:  Friday October 21 2023 08:11:44 EDT Ventricular Rate:  75 PR Interval:    QRS Duration:  152 QT Interval:  434 QTC Calculation: 484 R Axis:   -81  Text Interpretation: Ventricular-paced rhythm When compared with ECG of 02-Oct-2021 16:05, PREVIOUS ECG IS PRESENT Confirmed by Rana Dixon [phone]) on 10/21/2023 8:17:16 AM    Echocardiogram 05/13/2021  1. Left ventricular ejection fraction, by estimation, is 60 to 65%. The  left ventricle has normal function. The left ventricle has no regional  wall motion abnormalities. There is mild concentric left ventricular  hypertrophy. Left ventricular diastolic  parameters are consistent with Grade II diastolic dysfunction  (pseudonormalization).   2. Right ventricular systolic function is mildly reduced. The right  ventricular size is normal. There  is mildly elevated pulmonary artery  systolic pressure.   3. Left atrial size was mildly dilated.   4. Right atrial size was moderately dilated.   5. The mitral valve is normal in  structure. Mild mitral valve  regurgitation. No evidence of mitral stenosis.   6. The aortic valve is calcified. Aortic valve regurgitation is mild.  Aortic valve sclerosis/calcification is present, without any evidence of  aortic stenosis. Aortic regurgitation PHT measures 503 msec. Aortic valve  area, by VTI measures 2.10 cm.  Aortic valve mean gradient measures 5.0 mmHg. Aortic valve Vmax measures  1.60 m/s.   7. The inferior vena cava is normal in size with greater than 50%  respiratory variability, suggesting right atrial pressure of 3 mmHg.   Risk Assessment/Calculations:    CHA2DS2-VASc Score = 7   This indicates a 11.2% annual risk of stroke. The patient's score is based upon: CHF History: 1 HTN History: 1 Diabetes History: 0 Stroke History: 2 Vascular Disease History: 0 Age Score: 2 Gender Score: 1          Physical Exam:   VS:  BP (!) 142/84 (BP Location: Left Arm, Patient Position: Sitting, Cuff Size: Normal)   Pulse 75   Ht 5' 4 (1.626 m)   Wt 143 lb (64.9 kg)   BMI 24.55 kg/m    Wt Readings from Last 3 Encounters:  10/21/23 143 lb (64.9 kg)  08/02/23 148 lb (67.1 kg)  07/18/23 149 lb (67.6 kg)    GEN: Well nourished, well developed in no acute distress NECK: No JVD; No carotid bruits CARDIAC: RRR, no murmurs, rubs, gallops RESPIRATORY:  Clear to auscultation without rales, wheezing or rhonchi  ABDOMEN: Soft, non-tender, non-distended EXTREMITIES:  No edema; No acute deformity      Assessment and Plan:  Hypertension Blood pressure today is elevated at 140/80 Home blood pressures have averaged 150s over 70s Given her age her goal should be less than 140/90 - Will consider addition of amlodipine  5 mg daily.  Although she is on diltiazem  (she does have pacemaker).  Will consider HCTZ, she does have normal renal function. - Per Dr. JAYSON although they do not make preparations higher than 360 mg per tablet/capsule, you can use diltiazem  up to 540 mg a day.  Since she has a pacemaker, bradycardia would not be a concern.  - Continue Lasix  40 mg daily, diltiazem  360 mg daily, and irbesartan  300 mg daily - Addendum 6/30.  Amlodipine  discontinued.  Plan to have patient take blood pressure daily over the next 1 to 2 weeks.  If BP is consistently greater than 140/90 we will plan to increase diltiazem  or add hydrochlorothiazide.  Permanent atrial fibrillation Not pacemaker dependent She does have history of prior transient ischemic attack, high embolic risk Remains stable.  Denies any symptoms of tachycardia, palpitations, syncope - Continue Coumadin  per Coumadin  clinic - Continue diltiazem  360 mg daily  Chronic diastolic heart failure Echocardiogram 05/13/2021 with LVEF 60 to 65%, no RWMA, grade 2 DD Today she is euvolemic and well compensated on exam.  NYHA class I.  Estimated dry weight is around 145-149 pounds. - Today she is euvolemic and well compensated on exam.  Weight is 143 pounds.  She has some mild SOB since her last OV, attributes to anxiety.  No leg swelling, orthopnea, PND, SOB at rest.  She is only taking her Lasix  40 mg daily instead of prescribed 80 mg daily due to urinary frequency She is euvolemic, she can continue  Lasix  at 40 mg daily  PPM Normal device function on most recent Paceart report - Managed by Dr. Francyne      Dispo:  Return in about 3 months (around 01/21/2024).  Signed, Lum LITTIE Louis, NP

## 2023-10-24 ENCOUNTER — Ambulatory Visit: Payer: Self-pay | Admitting: Emergency Medicine

## 2023-10-24 ENCOUNTER — Ambulatory Visit: Attending: Cardiovascular Disease

## 2023-10-24 DIAGNOSIS — I482 Chronic atrial fibrillation, unspecified: Secondary | ICD-10-CM | POA: Diagnosis not present

## 2023-10-24 DIAGNOSIS — Z7901 Long term (current) use of anticoagulants: Secondary | ICD-10-CM | POA: Diagnosis not present

## 2023-10-24 LAB — POCT INR: INR: 1.6 — AB (ref 2.0–3.0)

## 2023-10-24 NOTE — Patient Instructions (Signed)
 Take 2.5 tablets today only then Continue taking Warfarin 1 tablet daily except 1.5 tablets Mondays, Wednesdays and Fridays.   Repeat INR in 4 weeks.  Call [phone] with any questions

## 2023-10-24 NOTE — Progress Notes (Signed)
Please see anticoagulation encounter.

## 2023-10-31 NOTE — Addendum Note (Signed)
 Addended by: Giovonni Poirier L on: 10/31/2023 01:07 PM   Modules accepted: Orders

## 2023-11-03 ENCOUNTER — Ambulatory Visit: Admitting: Urgent Care

## 2023-11-03 ENCOUNTER — Encounter: Payer: Self-pay | Admitting: Cardiovascular Disease

## 2023-11-11 ENCOUNTER — Encounter: Payer: Self-pay | Admitting: Urgent Care

## 2023-11-11 ENCOUNTER — Ambulatory Visit (INDEPENDENT_AMBULATORY_CARE_PROVIDER_SITE_OTHER)

## 2023-11-11 ENCOUNTER — Ambulatory Visit: Payer: Self-pay | Admitting: Urgent Care

## 2023-11-11 ENCOUNTER — Ambulatory Visit (INDEPENDENT_AMBULATORY_CARE_PROVIDER_SITE_OTHER): Admitting: Urgent Care

## 2023-11-11 VITALS — BP 152/72 | HR 82 | Resp 18 | Ht 64.0 in | Wt 140.2 lb

## 2023-11-11 DIAGNOSIS — M533 Sacrococcygeal disorders, not elsewhere classified: Secondary | ICD-10-CM

## 2023-11-11 DIAGNOSIS — R7303 Prediabetes: Secondary | ICD-10-CM

## 2023-11-11 DIAGNOSIS — I10 Essential (primary) hypertension: Secondary | ICD-10-CM

## 2023-11-11 DIAGNOSIS — E673 Hypervitaminosis D: Secondary | ICD-10-CM

## 2023-11-11 DIAGNOSIS — E5111 Dry beriberi: Secondary | ICD-10-CM | POA: Diagnosis not present

## 2023-11-11 DIAGNOSIS — G2581 Restless legs syndrome: Secondary | ICD-10-CM | POA: Diagnosis not present

## 2023-11-11 DIAGNOSIS — R748 Abnormal levels of other serum enzymes: Secondary | ICD-10-CM

## 2023-11-11 NOTE — Progress Notes (Unsigned)
 Established Patient Office Visit  Subjective:  [name], female    DOB: 1927-11-01  Age: 89 y.o. MRN: [id number]  No chief complaint on file.   HPI  Discussed the use of AI scribe software for clinical note transcription with the patient, who gave verbal consent to proceed.  History of Present Illness   The patient is a 89 year old with back pain and neuropathy who presents for evaluation of persistent back pain.  She experiences significant back pain located around the sacral area, radiating towards the hip. Various topical treatments, including Icy Hot, Salonpas, and aspirin  cream, have been ineffective. Tylenol  also does not alleviate the pain, which is persistent and significantly bothersome.  She has a history of neuropathy and is currently taking 100 mg of vitamin B1 (thiamine) daily, but has not noticed any improvement in her symptoms. Recent changes in her heart medication may be affecting her condition.  She has been on anastrozole  for breast cancer for several years and is expected to discontinue it in November. She has not experienced common side effects such as hot flashes. Her blood pressure has been fluctuating, and she has been in communication with her heart doctor regarding medication adjustments.  Her past medical history includes a borderline diabetes diagnosis, with a previous A1c of 6.0. She wants to eat foods like potatoes in moderation. Her vitamin D  levels were previously high, and this will be rechecked.  No improvement in neuropathy symptoms with thiamine supplementation. She has seen relief with ropinirole for RLS.      Patient Active Problem List   Diagnosis Date Noted   At high risk for injury related to fall 11/23/2021   Unstable gait 11/23/2021   Scalp hematoma 10/05/2021   Forehead laceration 10/05/2021   Back pain 10/05/2021   Syncope 10/02/2021   Pacemaker battery depletion 08/24/2021   Longstanding persistent atrial  fibrillation (HCC)    Aortic atherosclerosis (HCC) by Chest CT 0n 10/02/2021 01/12/2021   Invasive ductal carcinoma of breast, female, right (HCC) 12/13/2019   Malignant neoplasm of upper-outer quadrant of right breast in female, estrogen receptor positive (HCC) 11/21/2019   Anxiety 06/18/2019   Insomnia 06/18/2019   Recurrent major 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 (HCC) 06/18/2019   SSS (sick sinus syndrome) (HCC) 09/26/2018   Abnormal glucose 08/29/2018   Neural foraminal stenosis of cervical spine 04/18/2018   History of TIA (transient ischemic attack) 04/17/2018   Chronic atrial fibrillation (HCC)    Chronic diastolic heart failure (HCC) 11/10/2017   Prediabetes 08/15/2013   Vitamin D  deficiency 08/15/2013   Pacemaker 09/19/2012   Long term current use of anticoagulant therapy 07/18/2012   Hyperlipidemia, mixed 09/03/2008   Essential hypertension 09/03/2008   Coronary atherosclerosis by Chest CT on 10/02/2021 09/03/2008   GERD 09/03/2008   FIBROCYSTIC BREAST DISEASE 09/03/2008   Osteoarthritis 09/03/2008   Past Medical History:  Diagnosis Date   Allergy    Anxiety    Atrial fib/flutter, transient (HCC)    Atrial fibrillation, chronic (HCC)    CHF (congestive heart failure) (HCC) 10/29/2009   Echo - EF >55%; normal LV size and systolic function; unable to assess diastolic fcn due to E/A fusion, pulmonary vein flow pattern suggests elevated filling pressure; marked biatrail dilation, mild/mod tricuspid regurgitation; mod pulmonary htn; mild/mod mitral regurgitation; although echocardiographic features are incomplete findings suggest possible infiltrative cardiomyopathy (maybe amyloidosi   Clotting disorder (HCC)    She's on a blood thinner   Coronary artery disease 03/19/2002  R/P Cardiolite  - EF 76%; nromal static and dynamic myocardial perfusion images; normal wall motion and endocardial thickening in all vascular territories   Depression    Dyspnea    Dysrhythmia    Facial  numbness 12/26/2008   carotid doppler - R and L ICAs 0-49% diameter reduction (velocities suggest low end of scale)   Hypertension    Pacemaker    Peripheral neuropathy    Pneumonia 2017   Skin cancer    s/p surgical removal.   Stroke (HCC) 04/2017   TIA (transient ischemic attack)    Past Surgical History:  Procedure Laterality Date   ABDOMINAL HYSTERECTOMY     APPENDECTOMY     BREAST BIOPSY Right 11/16/2019   BREAST LUMPECTOMY Right 12/13/2019   BREAST LUMPECTOMY WITH RADIOACTIVE SEED LOCALIZATION Right 12/13/2019   Procedure: RIGHT BREAST LUMPECTOMY WITH RADIOACTIVE SEED LOCALIZATION;  Surgeon: Belinda Cough, MD;  Location: [facility] OR;  Service: General;  Laterality: Right;  LMA VS MAC   BREAST SURGERY Left 2020   CARDIAC CATHETERIZATION  08/06/2005   minimal coronary disease predominant RCA; no significant atherosclerosis; new onset sick sinus syndrome and atrial flutter w/ ventricular response, controlled on med therapy; systemic HTN, normal renal arteries   CARDIOVERSION  11/19/2009   successful DCCV from AF to sinus type rhythm   CHOLECYSTECTOMY     EYE SURGERY Bilateral 2013   PPM GENERATOR CHANGEOUT N/A 08/24/2021   Procedure: PPM GENERATOR CHANGEOUT;  Surgeon: Francyne Headland, MD;  Location: [facility]VAS[facility];  Service: Cardiovascular;  Laterality: N/A;   Skin cancer resection     TONSILLECTOMY  1938   Social History   Tobacco Use   Smoking status: Never   Smokeless tobacco: Never  Vaping Use   Vaping status: Never Used  Substance Use Topics   Alcohol use: No   Drug use: No      ROS: as noted in HPI  Objective:     BP (!) 152/72 (BP Location: Left Arm, Patient Position: Sitting, Cuff Size: Normal)   Pulse 82   Resp 18   Ht 5' 4 (1.626 m)   Wt 140 lb 4 oz (63.6 kg)   SpO2 96%   BMI 24.07 kg/m  BP Readings from Last 3 Encounters:  11/11/23 (!) 152/72  10/21/23 (!) 142/84  08/02/23 (!) 153/81   Wt Readings from Last 3 Encounters:  11/11/23 140 lb 4  oz (63.6 kg)  10/21/23 143 lb (64.9 kg)  08/02/23 148 lb (67.1 kg)      Physical Exam Vitals and nursing note reviewed. Exam conducted with a chaperone present.  Constitutional:      General: She is not in acute distress.    Appearance: Normal appearance. She is not ill-appearing, toxic-appearing or diaphoretic.  HENT:     Head: Normocephalic and atraumatic.     Nose: Nose normal.     Mouth/Throat:     Mouth: Mucous membranes are moist.  Eyes:     General: No scleral icterus.       Right eye: No discharge.        Left eye: No discharge.     Extraocular Movements: Extraocular movements intact.     Pupils: Pupils are equal, round, and reactive to light.  Neck:     Thyroid : No thyroid  mass, thyromegaly or thyroid  tenderness.  Cardiovascular:     Rate and Rhythm: Normal rate and regular rhythm.     Heart sounds: No murmur heard. Pulmonary:  Effort: Pulmonary effort is normal. No respiratory distress.     Breath sounds: Normal breath sounds. No stridor. No wheezing or rhonchi.  Musculoskeletal:        General: Tenderness present.     Cervical back: Normal range of motion and neck supple. No rigidity or tenderness.     Lumbar back: Bony tenderness present.     Right lower leg: No edema.     Left lower leg: No edema.     Comments: Significant discomfort to R SI joint, sacrum, and ilium  Lymphadenopathy:     Cervical: No cervical adenopathy.  Skin:    General: Skin is warm and dry.     Coloration: Skin is not jaundiced.     Findings: No bruising, erythema or rash.     Comments: Dry, flaking skin to entire back, worse midline L spine Numerous scattered SKs to back  Neurological:     General: No focal deficit present.     Mental Status: She is alert and oriented to person, place, and time.     Gait: Gait abnormal (uses rollator).  Psychiatric:        Mood and Affect: Mood normal.        Behavior: Behavior normal.      No results found for any visits on  11/11/23.  Last CBC Lab Results  Component Value Date   WBC 7.5 07/11/2023   HGB 13.5 07/11/2023   HCT 41.1 07/11/2023   MCV 97.9 07/11/2023   MCH 31.4 02/28/2023   RDW 13.3 07/11/2023   PLT 224.0 07/11/2023   Last metabolic panel Lab Results  Component Value Date   GLUCOSE 90 10/21/2023   NA 143 10/21/2023   K 4.3 10/21/2023   CL 104 10/21/2023   CO2 24 10/21/2023   BUN 26 10/21/2023   CREATININE 0.80 10/21/2023   EGFR 68 10/21/2023   CALCIUM  9.5 10/21/2023   PHOS 2.6 10/03/2021   PROT 6.9 07/11/2023   ALBUMIN 4.4 07/11/2023   BILITOT 0.4 07/11/2023   ALKPHOS 123 (H) 07/11/2023   AST 16 07/11/2023   ALT 10 07/11/2023   ANIONGAP 7 10/04/2021   Last lipids Lab Results  Component Value Date   CHOL 167 02/28/2023   HDL 45 (L) 02/28/2023   LDLCALC 90 02/28/2023   TRIG 228 (H) 02/28/2023   CHOLHDL 3.7 02/28/2023   Last hemoglobin A1c Lab Results  Component Value Date   HGBA1C 6.0 07/11/2023   Last thyroid  functions Lab Results  Component Value Date   TSH 0.81 07/11/2023   Last vitamin D  Lab Results  Component Value Date   VD25OH 110.63 (HH) 07/11/2023   Last vitamin B12 and Folate Lab Results  Component Value Date   VITAMINB12 903 07/11/2023   FOLATE 11.7 07/11/2023      The ASCVD Risk score (Arnett DK, et al., 2019) failed to calculate for the following reasons:   The 2019 ASCVD risk score is only valid for ages 29 to 33   Risk score cannot be calculated because patient has a medical history suggesting prior/existing ASCVD  Assessment & Plan:  Restless leg syndrome  Essential hypertension  Thiamine deficiency neuropathy -     Vitamin B1  Prediabetes -     Hemoglobin A1c  High vitamin D  level -     VITAMIN D  25 Hydroxy (Vit-D Deficiency, Fractures)  Sacral back pain -     DG HIP UNILAT W OR W/O PELVIS 2-3 VIEWS RIGHT; Future  Alkaline phosphatase  elevation -     CMP14+EGFR  Assessment and Plan    Back Pain Chronic sacral back  pain likely bony in origin, not neuropathic. Elevated alkaline phosphatase suggests bone involvement. - Order sacral and hip x-ray to assess for bony abnormalities.  Peripheral Neuropathy Persistent neuropathy despite thiamine supplementation. Not related to back pain. - Continue thiamine (Vitamin B1) supplementation at 100 mg daily.  Hypertension Hypertension with cardiologist involvement. Anastrozole  may contribute to elevated blood pressure. Oncologist plans early discontinuation of anastrozole . - Monitor blood pressure and communicate with cardiologist regarding medication adjustments. - Contact oncologist to discuss early discontinuation of anastrozole  to assess impact on blood pressure.  Breast Cancer Breast cancer treatment with anastrozole , tolerated well. Oncologist plans early discontinuation. - Confirm with oncologist the plan to discontinue anastrozole  in November 2025.  Diabetes Screening Borderline diabetes with A1c of 6.0. Aggressive treatment not planned unless A1c increases significantly. - Recheck A1c to monitor glucose control.  General Health Maintenance Focus on dietary moderation and portion control for diabetes management. - Encourage balanced diet with portion control, allowing for occasional consumption of white and sweet potatoes.         Return in about 6 months (around 05/13/2024) for Annual Physical.   Benton LITTIE Gave, PA

## 2023-11-11 NOTE — Patient Instructions (Signed)
 Please continue all medications as ordered. Please go to [address] to get a hip/ sacral xray.  We will contact you with the results of your labs. Continue to monitor BP at home; suspect BP may drop once you stop arimidex .

## 2023-11-16 LAB — CMP14+EGFR
ALT: 11 IU/L (ref 0–32)
AST: 16 IU/L (ref 0–40)
Albumin: 4.3 g/dL (ref 3.6–4.6)
Alkaline Phosphatase: 156 IU/L — ABNORMAL HIGH (ref 44–121)
BUN/Creatinine Ratio: 36 — ABNORMAL HIGH (ref 12–28)
BUN: 34 mg/dL (ref 10–36)
Bilirubin Total: 0.4 mg/dL (ref 0.0–1.2)
CO2: 23 mmol/L (ref 20–29)
Calcium: 9.3 mg/dL (ref 8.7–10.3)
Chloride: 103 mmol/L (ref 96–106)
Creatinine, Ser: 0.94 mg/dL (ref 0.57–1.00)
Globulin, Total: 2.6 g/dL (ref 1.5–4.5)
Glucose: 120 mg/dL — ABNORMAL HIGH (ref 70–99)
Potassium: 4.2 mmol/L (ref 3.5–5.2)
Sodium: 143 mmol/L (ref 134–144)
Total Protein: 6.9 g/dL (ref 6.0–8.5)
eGFR: 56 mL/min/1.73 — ABNORMAL LOW (ref >59)

## 2023-11-16 LAB — HEMOGLOBIN A1C
Est. average glucose Bld gHb Est-mCnc: 123 mg/dL
Hgb A1c MFr Bld: 5.9 % — ABNORMAL HIGH (ref 4.8–5.6)

## 2023-11-16 LAB — VITAMIN D 25 HYDROXY (VIT D DEFICIENCY, FRACTURES): Vit D, 25-Hydroxy: 63.4 ng/mL (ref 30.0–100.0)

## 2023-11-16 LAB — VITAMIN B1: Thiamine: 208.8 nmol/L — AB (ref 66.5–200.0)

## 2023-11-19 ENCOUNTER — Other Ambulatory Visit: Payer: Self-pay | Admitting: Nurse Practitioner

## 2023-11-22 ENCOUNTER — Ambulatory Visit: Payer: Medicare Other

## 2023-11-22 ENCOUNTER — Ambulatory Visit: Attending: Internal Medicine | Admitting: *Deleted

## 2023-11-22 DIAGNOSIS — Z7901 Long term (current) use of anticoagulants: Secondary | ICD-10-CM | POA: Diagnosis not present

## 2023-11-22 DIAGNOSIS — I482 Chronic atrial fibrillation, unspecified: Secondary | ICD-10-CM | POA: Diagnosis not present

## 2023-11-22 DIAGNOSIS — I495 Sick sinus syndrome: Secondary | ICD-10-CM | POA: Diagnosis not present

## 2023-11-22 LAB — POCT INR: INR: 2 (ref 2.0–3.0)

## 2023-11-22 NOTE — Progress Notes (Signed)
 INR 2.0 Please see anticoagulation encounter.

## 2023-11-22 NOTE — Patient Instructions (Addendum)
 Description   Take 1.5 tablets today only then continue taking Warfarin 1 tablet daily except 1.5 tablets Mondays, Wednesdays and Fridays.   Repeat INR in 4 weeks.  Call [phone] with any questions

## 2023-11-23 LAB — CUP PACEART REMOTE DEVICE CHECK
Battery Remaining Longevity: 137 mo
Battery Voltage: 3.03 V
Brady Statistic AP VP Percent: 0 %
Brady Statistic AP VS Percent: 0 %
Brady Statistic AS VP Percent: 88.74 %
Brady Statistic AS VS Percent: 11.26 %
Brady Statistic RA Percent Paced: 0 %
Brady Statistic RV Percent Paced: 88.74 %
Date Time Interrogation Session: 20250721192204
Implantable Lead Connection Status: 753985
Implantable Lead Connection Status: 753985
Implantable Lead Implant Date: 20110428
Implantable Lead Implant Date: 20110428
Implantable Lead Location: 753859
Implantable Lead Location: 753860
Implantable Lead Model: 4092
Implantable Lead Model: 4592
Implantable Pulse Generator Implant Date: 20230424
Lead Channel Impedance Value: 456 Ohm
Lead Channel Impedance Value: 494 Ohm
Lead Channel Impedance Value: 570 Ohm
Lead Channel Impedance Value: 627 Ohm
Lead Channel Pacing Threshold Amplitude: 0.75 V
Lead Channel Pacing Threshold Pulse Width: 0.4 ms
Lead Channel Sensing Intrinsic Amplitude: 1.375 mV
Lead Channel Sensing Intrinsic Amplitude: 10.75 mV
Lead Channel Sensing Intrinsic Amplitude: 10.75 mV
Lead Channel Setting Pacing Amplitude: 2 V
Lead Channel Setting Pacing Pulse Width: 0.4 ms
Lead Channel Setting Sensing Sensitivity: 1.2 mV
Zone Setting Status: 755011

## 2023-12-05 ENCOUNTER — Ambulatory Visit: Payer: Self-pay | Admitting: Cardiovascular Disease

## 2023-12-20 ENCOUNTER — Ambulatory Visit: Attending: Cardiology

## 2023-12-20 DIAGNOSIS — I482 Chronic atrial fibrillation, unspecified: Secondary | ICD-10-CM | POA: Diagnosis not present

## 2023-12-20 DIAGNOSIS — Z7901 Long term (current) use of anticoagulants: Secondary | ICD-10-CM

## 2023-12-20 LAB — POCT INR: INR: 1.9 — AB (ref 2.0–3.0)

## 2023-12-20 NOTE — Progress Notes (Signed)
 INR 1.9 Please see anticoagulation encounter Take 2 tablets today only then continue taking Warfarin 1 tablet daily except 1.5 tablets Mondays, Wednesdays and Fridays.   Repeat INR in 4 weeks.  Call [phone] with any questions

## 2023-12-20 NOTE — Patient Instructions (Signed)
 Take 2 tablets today only then continue taking Warfarin 1 tablet daily except 1.5 tablets Mondays, Wednesdays and Fridays.   Repeat INR in 4 weeks.  Call [phone] with any questions

## 2024-01-12 ENCOUNTER — Encounter: Payer: Self-pay | Admitting: Cardiovascular Disease

## 2024-01-12 ENCOUNTER — Ambulatory Visit: Attending: Cardiovascular Disease | Admitting: Cardiovascular Disease

## 2024-01-12 VITALS — BP 160/80 | HR 82 | Ht 64.0 in | Wt 137.9 lb

## 2024-01-12 DIAGNOSIS — I10 Essential (primary) hypertension: Secondary | ICD-10-CM

## 2024-01-12 DIAGNOSIS — I4821 Permanent atrial fibrillation: Secondary | ICD-10-CM

## 2024-01-12 DIAGNOSIS — D6869 Other thrombophilia: Secondary | ICD-10-CM

## 2024-01-12 DIAGNOSIS — Z950 Presence of cardiac pacemaker: Secondary | ICD-10-CM

## 2024-01-12 DIAGNOSIS — I5032 Chronic diastolic (congestive) heart failure: Secondary | ICD-10-CM | POA: Diagnosis not present

## 2024-01-12 DIAGNOSIS — E782 Mixed hyperlipidemia: Secondary | ICD-10-CM

## 2024-01-12 DIAGNOSIS — Z79899 Other long term (current) drug therapy: Secondary | ICD-10-CM

## 2024-01-12 MED ORDER — SPIRONOLACTONE 25 MG PO TABS: 25.0000 mg | ORAL_TABLET | Freq: Every day | ORAL | 3 refills | Status: AC

## 2024-01-12 NOTE — Patient Instructions (Addendum)
 Medication Instructions:  Start Spironolactone  25 mg daily Stop Potassium one week after starting the Spironolactone  *If you need a refill on your cardiac medications before your next appointment, please call your pharmacy*  Check BP daily- after being on the Spironolactone  for about a week. Log BP for a week or two, then send in to Dr C.   Lab Work: Get BMP- Come in October to get lab work. You can have this drawn at any [facility]- or come to our office at [address] [place], [place] [address], lab is on the 1st floor. No appointment needed- drop in from 8-4.  Thank you Katie, RN  If you have labs (blood work) drawn today and your tests are completely normal, you will receive your results only by: MyChart Message (if you have MyChart) OR A paper copy in the mail If you have any lab test that is abnormal or we need to change your treatment, we will call you to review the results.  Testing/Procedures: None ordered  Follow-Up: At [facility], you and your health needs are our priority.  As part of our continuing mission to provide you with exceptional heart care, our providers are all part of one team.  This team includes your primary Cardiologist (physician) and Advanced Practice Providers or APPs (Physician Assistants and Nurse Practitioners) who all work together to provide you with the care you need, when you need it.  Your next appointment:   1 year(s)  Provider:   Jerel Balding, MD    We recommend signing up for the patient portal called MyChart.  Sign up information is provided on this After Visit Summary.  MyChart is used to connect with patients for Virtual Visits (Telemedicine).  Patients are able to view lab/test results, encounter notes, upcoming appointments, etc.  Non-urgent messages can be sent to your provider as well.   To learn more about what you can do with MyChart, go to [email or web address].

## 2024-01-12 NOTE — Progress Notes (Signed)
 [name], female   DOB: 08/03/1927, 89 y.o.   MRN: [id number]    Cardiology Office Note    Date:  01/12/2024   [name], DOB 1927-07-31, MRN [id number]  PCP:  Lowella Benton CROME, PA  Cardiologist:   Jerel Balding, MD   No chief complaint on file.   History of Present Illness:  Misty Blackwell is a 89 y.o. female with a history of atrial fibrillation with slow ventricular response, status post single-chamber permanent pacemaker. She has a new Medtronic Azure device implanted 2023, but the ventricular lead is a old 63 lead from 2011 so the system is not MRI conditional.  The show medical problems include remote TIA, hypertension, nonobstructive CAD, asymptomatic NSVT detected by the pacemaker, history of possible diastolic heart failure (questionable since diagnosis of pseudo normal filling was applied on an echocardiogram in atrial fibrillation).    She is under a lot of emotional strain since her best friend for the last 60 years is ill and in hospice.  She has been very anxious and sad.  This has made her feel short of breath.  Her blood pressure was very elevated when she first checked in today at 213/98, although it did decrease to 160/80 by the end of our office visit.  She has checked her blood pressure every now and then at home and many times it has been in completely normal range, although recently has been a little high.  She denies chest pain.  She has not had orthopnea, PND or lower extremity edema (except towards the end of the day at times).  Her pacemaker site looks healthy although the device is quite prominent due to significant fat atrophy.  Pacemaker interrogation shows normal device function.  Although her current generator is MRI conditional, she has old leads that are not MRI compatible.  She has an estimated current longevity of 11 years.  92% ventricular pacing with a decent heart rate histogram distribution.  There are very  infrequent episodes of rapid ventricular rate.  Activity level is quite substantial for 89 year old, 4-5 hours a day.  She has lost about 10 pounds since last year.  Not sure what her dry weight might be anymore.  She appears to be euvolemic by clinical evaluation today.  She has been taking her furosemide  on a daily basis.  In the past when she took diuretics daily, she seemed to have frequent problems with orthostatic hypotension, but this has not been a problem recently.   In 2016 she underwent echocardiography and nuclear stress testing.  Left ventricular systolic function was normal, she had a fixed moderate apical defect but without reversible ischemia.  Moderate tricuspid regurgitation and moderate pulmonary artery hypertension with estimated systolic PA pressure of 58 mmHg.  In April 2019 she had a nuclear stress test showing normal perfusion and an echocardiogram which showed unchanged findings of mild LVH, normal left ventricular systolic function, mild left atrial dilation, improved degree of pulmonary hypertension 41 mmHg.   Past Medical History:  Diagnosis Date   Allergy    Anxiety    Atrial fib/flutter, transient (HCC)    Atrial fibrillation, chronic (HCC)    CHF (congestive heart failure) (HCC) 10/29/2009   Echo - EF >55%; normal LV size and systolic function; unable to assess diastolic fcn due to E/A fusion, pulmonary vein flow pattern suggests elevated filling pressure; marked biatrail dilation, mild/mod tricuspid regurgitation; mod pulmonary htn; mild/mod mitral regurgitation; although echocardiographic features are  incomplete findings suggest possible infiltrative cardiomyopathy (maybe amyloidosi   Clotting disorder (HCC)    She's on a blood thinner   Coronary artery disease 03/19/2002   R/P Cardiolite  - EF 76%; nromal static and dynamic myocardial perfusion images; normal wall motion and endocardial thickening in all vascular territories   Depression    Dyspnea     Dysrhythmia    Facial numbness 12/26/2008   carotid doppler - R and L ICAs 0-49% diameter reduction (velocities suggest low end of scale)   Hypertension    Pacemaker    Peripheral neuropathy    Pneumonia 2017   Skin cancer    s/p surgical removal.   Stroke (HCC) 04/2017   TIA (transient ischemic attack)     Past Surgical History:  Procedure Laterality Date   ABDOMINAL HYSTERECTOMY     APPENDECTOMY     BREAST BIOPSY Right 11/16/2019   BREAST LUMPECTOMY Right 12/13/2019   BREAST LUMPECTOMY WITH RADIOACTIVE SEED LOCALIZATION Right 12/13/2019   Procedure: RIGHT BREAST LUMPECTOMY WITH RADIOACTIVE SEED LOCALIZATION;  Surgeon: Belinda Cough, MD;  Location: [facility] OR;  Service: General;  Laterality: Right;  LMA VS MAC   BREAST SURGERY Left 2020   CARDIAC CATHETERIZATION  08/06/2005   minimal coronary disease predominant RCA; no significant atherosclerosis; new onset sick sinus syndrome and atrial flutter w/ ventricular response, controlled on med therapy; systemic HTN, normal renal arteries   CARDIOVERSION  11/19/2009   successful DCCV from AF to sinus type rhythm   CHOLECYSTECTOMY     EYE SURGERY Bilateral 2013   PPM GENERATOR CHANGEOUT N/A 08/24/2021   Procedure: PPM GENERATOR CHANGEOUT;  Surgeon: Francyne Headland, MD;  Location: [facility] INVASIVE CV [facility];  Service: Cardiovascular;  Laterality: N/A;   Skin cancer resection     TONSILLECTOMY  1938    Outpatient Medications Prior to Visit  Medication Sig Dispense Refill   anastrozole  (ARIMIDEX ) 1 MG tablet Take 1 tablet (1 mg total) by mouth daily. 90 tablet 3   Ascorbic Acid  (VITAMIN C) 1000 MG tablet Take 1,000 mg by mouth daily.      Cyanocobalamin  (VITAMIN B 12 PO) Take 1,000 mcg by mouth daily.      diltiazem  (CARDIZEM  CD) 360 MG 24 hr capsule Take 1 capsule (360 mg total) by mouth daily. 30 capsule 3   furosemide  (LASIX ) 40 MG tablet Take 1 tablet (40 mg total) by mouth daily. 90 tablet 1   irbesartan  (AVAPRO ) 300 MG tablet Take  1 tablet   Daily  for BP & Heart                                                                         /                                                                   TAKE  BY                                                 MOUTH 90 tablet 1   nystatin  (MYCOSTATIN /NYSTOP ) powder Apply 1 Application topically 3 (three) times daily. 15 g 0   OVER THE COUNTER MEDICATION Apply 1 application. topically See admin instructions. Neuropathy maximum strength Nerve relief and recovery cream- Apply to the feet at bedtime     pramipexole  (MIRAPEX ) 0.125 MG tablet Take 1 tablet (0.125 mg total) by mouth at bedtime. 90 tablet 1   thiamine (VITAMIN B-1) 50 MG tablet Take 1 tablet (50 mg total) by mouth daily. 90 tablet 0   triamcinolone  cream (KENALOG ) 0.1 % Apply 1 Application topically 2 (two) times daily. Do not exceed 14 consecutive days 60 g 0   warfarin (COUMADIN ) 3 MG tablet TAKE 1 TO 1.5 TABLET BY MOUTH EVERY DAY AS DIRECTED 135 tablet 1   zinc  gluconate 50 MG tablet Take 50 mg by mouth daily.     potassium chloride  SA (KLOR-CON  M) 20 MEQ tablet TAKE 1 TABLET BY MOUTH TWICE DAILY FOR POTASSIUM 180 tablet 3   Magnesium  250 MG TABS Take 250 mg by mouth daily.     No facility-administered medications prior to visit.     Allergies:   Latex, Ace inhibitors, Augmentin [amoxicillin-pot clavulanate], Ciprofloxacin , Levaquin [levofloxacin in d5w], Zocor [simvastatin], Acrylic polymer [carbomer], and Chocolate   Family History:  The patient's family history includes Cirrhosis in her brother; Diabetes in her mother; Heart attack in her father; Heart disease in her father and mother.   ROS:   Please see the history of present illness.    ROS All other systems are reviewed and are negative.   PHYSICAL EXAM:   VS:  BP (!) 160/80   Pulse 82   Ht 5' 4 (1.626 m)   Wt 137 lb 14.4 oz (62.6 kg)   SpO2 95%   BMI 23.67 kg/m      General: Alert, oriented x3, no distress, healthy  left subclavian pacemaker site but with substantial fat atrophy which makes the loops of the leads stick out prominently. Head: no evidence of trauma, PERRL, EOMI, no exophtalmos or lid lag, no myxedema, no xanthelasma; normal ears, nose and oropharynx Neck: normal jugular venous pulsations and no hepatojugular reflux; brisk carotid pulses without delay and no carotid bruits Chest: clear to auscultation, no signs of consolidation by percussion or palpation, normal fremitus, symmetrical and full respiratory excursions Cardiovascular: normal position and quality of the apical impulse, regular rhythm, normal first and second heart sounds, no murmurs, rubs or gallops Abdomen: no tenderness or distention, no masses by palpation, no abnormal pulsatility or arterial bruits, normal bowel sounds, no hepatosplenomegaly Extremities: no clubbing, cyanosis or edema; 2+ radial, ulnar and brachial pulses bilaterally; 2+ right femoral, posterior tibial and dorsalis pedis pulses; 2+ left femoral, posterior tibial and dorsalis pedis pulses; no subclavian or femoral bruits Neurological: grossly nonfocal Psych: Normal mood and affect   Wt Readings from Last 3 Encounters:  01/12/24 137 lb 14.4 oz (62.6 kg)  11/11/23 140 lb 4 oz (63.6 kg)  10/21/23 143 lb (64.9 kg)      Studies/Labs Reviewed:   EKG: Recently reviewed ECG tracing from 10/03/2023 which shows background atrial fibrillation with 100% ventricular pacing  EKG Interpretation Date/Time:    Ventricular  Rate:    PR Interval:    QRS Duration:    QT Interval:    QTC Calculation:   R Axis:      Text Interpretation:            07/11/2023: Hemoglobin 13.5; Magnesium  2.3; Platelets 224.0; TSH 0.81 11/11/2023: ALT 11; BUN 34; Creatinine, Ser 0.94; Potassium 4.2; Sodium 143   Lipid Panel    Component Value Date/Time   CHOL 167 02/28/2023 1120   TRIG 228 (H) 02/28/2023 1120   HDL 45 (L) 02/28/2023 1120   CHOLHDL 3.7 02/28/2023 1120   VLDL 34  (H) 09/28/2016 1125   LDLCALC 90 02/28/2023 1120     ASSESSMENT:    1. Chronic diastolic heart failure (HCC)   2. Permanent atrial fibrillation (HCC)   3. Essential hypertension   4. Acquired thrombophilia (HCC)   5. Pacemaker   6. Hyperlipidemia, mixed   7. On potassium sparing diuretic therapy         PLAN:  In order of problems listed above:  CHF: Clinically euvolemic, NYHA functional class I.  Unsure what her dry weight is anymore since she has lost weight. AFib: Permanent atrial fibrillation with slow ventricular response and high prevalence of ventricular pacing, but not entirely pacemaker dependent.   History of  previous transient ischemic attack (CHADSVasc 8: age 20, TIA 2, HTN, CAD, CHF, gender).  High embolic risk. HTN:   Not well-controlled.  Daily diuretics and higher doses led to orthostatic hypotension and falls.  Will add a very low-dose of spironolactone  and stop the potassium supplement.  Recheck blood pressure potassium level and renal function in the next few weeks. Anticoagulation: Has not had any bleeding problems.  Fair anticoagulation levels, although recently mildly under target INR. PPM: Normal device function.  Although the pacemaker generator is MRI conditional, the ventricular lead is not MRI compatible.  There is some fat atrophy at the pacemaker site and the loop of ventricular lead does put a little pressure on the skin, but there is no evidence of skin undermining.  Asked her to keep an eye on it and if there is any evidence that the skin becomes translucent, bruised, adherent to the device, she should call us  promptly. HLP: Managing with diet.  She has incidentally noted aortic atherosclerosis and had minor nonobstructive CAD at remote cardiac catheterization.  She has not had clinically evident CAD or PAD.  LDL cholesterol is a little higher than desirable at 89, but she has not tolerated statins.  At age 61, without an index atherothrombotic event, I  think we should continue the same lipid-lowering strategy.  HYPERTENSION CONTROL Vitals:   01/12/24 0959 01/12/24 1524  BP: (!) 213/98 (!) 160/80    The patient's blood pressure is elevated above target today.  In order to address the patient's elevated BP: A new medication was prescribed today.      Medication Adjustments/Labs and Tests Ordered: Current medicines are reviewed at length with the patient today.  Concerns regarding medicines are outlined above.  Medication changes, Labs and Tests ordered today are listed in the Patient Instructions below. Patient Instructions  Medication Instructions:  Start Spironolactone  25 mg daily Stop Potassium one week after starting the Spironolactone  *If you need a refill on your cardiac medications before your next appointment, please call your pharmacy*  Check BP daily- after being on the Spironolactone  for about a week. Log BP for a week or two, then send in to Dr C.   Lab  Work: Get [employer]- Come in October to get lab work. You can have this drawn at any [facility]- or come to our office at [address] [place], [place] [address], lab is on the 1st floor. No appointment needed- drop in from 8-4.  Thank you Katie, RN  If you have labs (blood work) drawn today and your tests are completely normal, you will receive your results only by: MyChart Message (if you have MyChart) OR A paper copy in the mail If you have any lab test that is abnormal or we need to change your treatment, we will call you to review the results.  Testing/Procedures: None ordered  Follow-Up: At [facility], you and your health needs are our priority.  As part of our continuing mission to provide you with exceptional heart care, our providers are all part of one team.  This team includes your primary Cardiologist (physician) and Advanced Practice Providers or APPs (Physician Assistants and Nurse Practitioners) who all work together to provide you with the care you  need, when you need it.  Your next appointment:   1 year(s)  Provider:   Jerel Balding, MD    We recommend signing up for the patient portal called MyChart.  Sign up information is provided on this After Visit Summary.  MyChart is used to connect with patients for Virtual Visits (Telemedicine).  Patients are able to view lab/test results, encounter notes, upcoming appointments, etc.  Non-urgent messages can be sent to your provider as well.   To learn more about what you can do with MyChart, go to [email or web address].             Signed, Jerel Balding, MD  01/12/2024 3:42 PM    [facility] [address] [place], [place], [place]  [address] Phone: [phone]; Fax: [phone]

## 2024-01-16 ENCOUNTER — Encounter: Payer: Self-pay | Admitting: Cardiovascular Disease

## 2024-01-17 ENCOUNTER — Ambulatory Visit: Attending: Cardiovascular Disease

## 2024-01-17 DIAGNOSIS — Z7901 Long term (current) use of anticoagulants: Secondary | ICD-10-CM | POA: Diagnosis not present

## 2024-01-17 DIAGNOSIS — I482 Chronic atrial fibrillation, unspecified: Secondary | ICD-10-CM

## 2024-01-17 LAB — POCT INR: INR: 2.7 (ref 2.0–3.0)

## 2024-01-17 NOTE — Patient Instructions (Signed)
 Description   INR 2.7, Continue taking Warfarin 1 tablet daily except 1.5 tablets Mondays, Wednesdays and Fridays.   Repeat INR in 4 weeks.  Call [phone] with any questions

## 2024-01-17 NOTE — Progress Notes (Signed)
 Description   INR 2.7, Continue taking Warfarin 1 tablet daily except 1.5 tablets Mondays, Wednesdays and Fridays.   Repeat INR in 4 weeks.  Call [phone] with any questions

## 2024-02-03 NOTE — Progress Notes (Signed)
 Remote PPM Transmission

## 2024-02-14 ENCOUNTER — Encounter: Payer: Self-pay | Admitting: Cardiovascular Disease

## 2024-02-14 ENCOUNTER — Ambulatory Visit: Attending: Cardiology | Admitting: *Deleted

## 2024-02-14 DIAGNOSIS — Z7901 Long term (current) use of anticoagulants: Secondary | ICD-10-CM | POA: Diagnosis not present

## 2024-02-14 DIAGNOSIS — I482 Chronic atrial fibrillation, unspecified: Secondary | ICD-10-CM

## 2024-02-14 LAB — BASIC METABOLIC PANEL WITH GFR
BUN/Creatinine Ratio: 41 — ABNORMAL HIGH (ref 12–28)
BUN: 29 mg/dL (ref 10–36)
CO2: 25 mmol/L (ref 20–29)
Calcium: 9.6 mg/dL (ref 8.7–10.3)
Chloride: 102 mmol/L (ref 96–106)
Creatinine, Ser: 0.7 mg/dL (ref 0.57–1.00)
Glucose: 94 mg/dL (ref 70–99)
Potassium: 4.2 mmol/L (ref 3.5–5.2)
Sodium: 141 mmol/L (ref 134–144)
eGFR: 80 mL/min/1.73 (ref >59)

## 2024-02-14 LAB — POCT INR: INR: 1.7 — AB (ref 2.0–3.0)

## 2024-02-14 NOTE — Progress Notes (Signed)
 Description   INR-1.7; Today take 1.5 tablets of warfarin then continue taking Warfarin 1 tablet daily except 1.5 tablets Mondays, Wednesdays and Fridays.   Repeat INR in 3 weeks.  Call [phone] with any questions

## 2024-02-14 NOTE — Patient Instructions (Signed)
 Description   INR-1.7; Today take 1.5 tablets of warfarin then continue taking Warfarin 1 tablet daily except 1.5 tablets Mondays, Wednesdays and Fridays.   Repeat INR in 3 weeks.  Call [phone] with any questions

## 2024-02-15 ENCOUNTER — Ambulatory Visit: Payer: Self-pay | Admitting: Cardiovascular Disease

## 2024-02-21 ENCOUNTER — Ambulatory Visit: Payer: Medicare Other

## 2024-02-21 DIAGNOSIS — I495 Sick sinus syndrome: Secondary | ICD-10-CM

## 2024-02-22 LAB — CUP PACEART REMOTE DEVICE CHECK
Battery Remaining Longevity: 133 mo
Battery Voltage: 3.03 V
Brady Statistic AP VP Percent: 0 %
Brady Statistic AP VS Percent: 0 %
Brady Statistic AS VP Percent: 62.4 %
Brady Statistic AS VS Percent: 37.6 %
Brady Statistic RA Percent Paced: 0 %
Brady Statistic RV Percent Paced: 62.4 %
Date Time Interrogation Session: 20251020204755
Implantable Lead Connection Status: 753985
Implantable Lead Connection Status: 753985
Implantable Lead Implant Date: 20110428
Implantable Lead Implant Date: 20110428
Implantable Lead Location: 753859
Implantable Lead Location: 753860
Implantable Lead Model: 4092
Implantable Lead Model: 4592
Implantable Pulse Generator Implant Date: 20230424
Lead Channel Impedance Value: 456 Ohm
Lead Channel Impedance Value: 494 Ohm
Lead Channel Impedance Value: 532 Ohm
Lead Channel Impedance Value: 589 Ohm
Lead Channel Pacing Threshold Amplitude: 0.625 V
Lead Channel Pacing Threshold Pulse Width: 0.4 ms
Lead Channel Sensing Intrinsic Amplitude: 1.375 mV
Lead Channel Sensing Intrinsic Amplitude: 10.875 mV
Lead Channel Sensing Intrinsic Amplitude: 10.875 mV
Lead Channel Setting Pacing Amplitude: 2 V
Lead Channel Setting Pacing Pulse Width: 0.4 ms
Lead Channel Setting Sensing Sensitivity: 1.2 mV
Zone Setting Status: 755011

## 2024-02-24 NOTE — Progress Notes (Signed)
 Remote PPM Transmission

## 2024-02-27 ENCOUNTER — Ambulatory Visit: Payer: Self-pay | Admitting: Cardiovascular Disease

## 2024-03-06 ENCOUNTER — Ambulatory Visit

## 2024-03-13 ENCOUNTER — Ambulatory Visit: Attending: Cardiovascular Disease | Admitting: Pharmacist

## 2024-03-13 DIAGNOSIS — Z7901 Long term (current) use of anticoagulants: Secondary | ICD-10-CM

## 2024-03-13 DIAGNOSIS — I482 Chronic atrial fibrillation, unspecified: Secondary | ICD-10-CM

## 2024-03-13 LAB — POCT INR: INR: 2 (ref 2.0–3.0)

## 2024-03-13 NOTE — Progress Notes (Signed)
 Description   INR-2.0; Continue taking Warfarin 1 tablet daily except 1.5 tablets Mondays, Wednesdays and Fridays.   Repeat INR in 4 weeks.  Call [phone] with any questions

## 2024-03-13 NOTE — Patient Instructions (Signed)
 Description   INR-2.0; Continue taking Warfarin 1 tablet daily except 1.5 tablets Mondays, Wednesdays and Fridays.   Repeat INR in 4 weeks.  Call [phone] with any questions

## 2024-03-26 ENCOUNTER — Inpatient Hospital Stay: Payer: Medicare Other | Attending: Hematology and Oncology | Admitting: Hematology and Oncology

## 2024-03-26 VITALS — BP 142/54 | HR 65 | Temp 97.6°F | Resp 17 | Ht 64.0 in | Wt 132.2 lb

## 2024-03-26 DIAGNOSIS — Z170 Estrogen receptor positive status [ER+]: Secondary | ICD-10-CM | POA: Diagnosis not present

## 2024-03-26 DIAGNOSIS — C50411 Malignant neoplasm of upper-outer quadrant of right female breast: Secondary | ICD-10-CM | POA: Insufficient documentation

## 2024-03-26 DIAGNOSIS — Z79811 Long term (current) use of aromatase inhibitors: Secondary | ICD-10-CM | POA: Diagnosis not present

## 2024-03-26 DIAGNOSIS — G629 Polyneuropathy, unspecified: Secondary | ICD-10-CM | POA: Insufficient documentation

## 2024-03-26 NOTE — Assessment & Plan Note (Signed)
 12/13/19: Right lumpectomy (Tsuei): invasive lobular carcinoma, grade 2, 2.4cm, clear margins.  ER 80%, PR 50%, HER-2 negative, Ki-67 5%   Treatment plan: Anastrozole adjuvant therapy 1 mg daily x5 years started September 2021 Anastrozole toxicities: Hot flashes have resolved. Otherwise tolerating it extremely well.    Breast cancer surveillance: Mammogram 12/21/2022.  Right breast possible asymmetry/architectural distortion, diagnostic mammogram and targeted ultrasound 0.5 cm (benign cysts) Breast exam 07/18/2023: Benign   She likes to travel around with her church group to hear [place] speakers. Chronic peripheral neuropathy: Improving.  She is now using a cane to get around.   Return to clinic in 1 year for follow-up

## 2024-03-26 NOTE — Progress Notes (Signed)
 Patient Care Team: Lowella Benton CROME, [place] as PCP - General (Physician Assistant) Francyne Headland, MD as PCP - Cardiology (Cardiology) Alline Lenis, MD (Inactive) as Consulting Physician (Urology) Margaret Eduard SAUNDERS, MD as Consulting Physician (Neurology) Rosemarie Eather RAMAN, MD as Consulting Physician (Neurology) [name], Headland, MD as Consulting Physician (Cardiology) Debrah Lamar BIRCH, MD (Inactive) as Consulting Physician (Gastroenterology) Tyree Nanetta SAILOR, RN as Oncology Nurse Navigator  DIAGNOSIS:  Encounter Diagnosis  Name Primary?   Malignant neoplasm of upper-outer quadrant of right breast in female, estrogen receptor positive (HCC) Yes    SUMMARY OF ONCOLOGIC HISTORY: Oncology History  Malignant neoplasm of upper-outer quadrant of right breast in female, estrogen receptor positive (HCC)  11/15/2019 Cancer Staging   Staging form: Breast, AJCC 8th Edition - Clinical stage from 11/15/2019: Stage IA (cT1c, cN0, cM0, G2, ER+, PR+, HER2-) - Signed by Crawford Morna Pickle, NP on 11/21/2019   11/15/2019 Initial Biopsy   Palpable right breast mass: 1.5 cm at 12 o'clock position, benign intramammary lymph node, axilla negative, right breast biopsy 1 grade 2 ER 80%, PR 50%, Ki-67 5%, HER-2 negative   12/13/2019 Surgery   Right lumpectomy (Tsuei): invasive lobular carcinoma, grade 2, 2.4cm, clear margins.      CHIEF COMPLIANT: Surveillance of breast cancer  HISTORY OF PRESENT ILLNESS:   History of Present Illness Misty Blackwell is a 89 year old female with breast cancer who presents for follow-up regarding her medication management.  She has been taking anastrozole  since 2021 for breast cancer and wishes to discontinue the automatic refill at [facility] as she no longer requires it.  She experiences neuropathy that affects her mobility. She uses a cane but finds it insufficient and is considering a wheelchair for better support. She also has a wellsite geologist for  outings.     ALLERGIES:  is allergic to latex, ace inhibitors, augmentin [amoxicillin-pot clavulanate], ciprofloxacin , levaquin [levofloxacin in d5w], zocor [simvastatin], acrylic polymer [carbomer], and chocolate.  MEDICATIONS:  Current Outpatient Medications  Medication Sig Dispense Refill   anastrozole  (ARIMIDEX ) 1 MG tablet Take 1 tablet (1 mg total) by mouth daily. 90 tablet 3   Ascorbic Acid  (VITAMIN C) 1000 MG tablet Take 1,000 mg by mouth daily.      Cyanocobalamin  (VITAMIN B 12 PO) Take 1,000 mcg by mouth daily.      diltiazem  (CARDIZEM  CD) 360 MG 24 hr capsule Take 1 capsule (360 mg total) by mouth daily. 30 capsule 3   furosemide  (LASIX ) 40 MG tablet Take 1 tablet (40 mg total) by mouth daily. 90 tablet 1   irbesartan  (AVAPRO ) 300 MG tablet Take  1 tablet  Daily  for BP & Heart                                                                         /                                                                   TAKE  BY                                                 MOUTH 90 tablet 1   Magnesium  250 MG TABS Take 250 mg by mouth daily.     nystatin  (MYCOSTATIN /NYSTOP ) powder Apply 1 Application topically 3 (three) times daily. 15 g 0   OVER THE COUNTER MEDICATION Apply 1 application. topically See admin instructions. Neuropathy maximum strength Nerve relief and recovery cream- Apply to the feet at bedtime     pramipexole  (MIRAPEX ) 0.125 MG tablet Take 1 tablet (0.125 mg total) by mouth at bedtime. 90 tablet 1   spironolactone  (ALDACTONE ) 25 MG tablet Take 1 tablet (25 mg total) by mouth daily. 90 tablet 3   thiamine (VITAMIN B-1) 50 MG tablet Take 1 tablet (50 mg total) by mouth daily. 90 tablet 0   triamcinolone  cream (KENALOG ) 0.1 % Apply 1 Application topically 2 (two) times daily. Do not exceed 14 consecutive days 60 g 0   warfarin (COUMADIN ) 3 MG tablet TAKE 1 TO 1.5 TABLET BY MOUTH EVERY DAY AS DIRECTED 135 tablet 1   zinc  gluconate  50 MG tablet Take 50 mg by mouth daily.     No current facility-administered medications for this visit.    PHYSICAL EXAMINATION: ECOG PERFORMANCE STATUS: 1 - Symptomatic but completely ambulatory  Vitals:   03/26/24 1011 03/26/24 1015  BP: (!) 161/59 (!) 142/54  Pulse: 83 65  Resp: 17   Temp: 97.6 F (36.4 C)   SpO2: 97%    Filed Weights   03/26/24 1011  Weight: 132 lb 3.2 oz (60 kg)     LABORATORY DATA:  I have reviewed the data as listed    Latest Ref Rng & Units 02/14/2024   10:33 AM 11/11/2023    3:28 PM 10/21/2023    9:06 AM  CMP  Glucose 70 - 99 mg/dL 94  879  90   BUN 10 - 36 mg/dL 29  34  26   Creatinine 0.57 - 1.00 mg/dL 9.29  9.05  9.19   Sodium 134 - 144 mmol/L 141  143  143   Potassium 3.5 - 5.2 mmol/L 4.2  4.2  4.3   Chloride 96 - 106 mmol/L 102  103  104   CO2 20 - 29 mmol/L 25  23  24    Calcium  8.7 - 10.3 mg/dL 9.6  9.3  9.5   Total Protein 6.0 - 8.5 g/dL  6.9    Total Bilirubin 0.0 - 1.2 mg/dL  0.4    Alkaline Phos 44 - 121 IU/L  156    AST 0 - 40 IU/L  16    ALT 0 - 32 IU/L  11      Lab Results  Component Value Date   WBC 7.5 07/11/2023   HGB 13.5 07/11/2023   HCT 41.1 07/11/2023   MCV 97.9 07/11/2023   PLT 224.0 07/11/2023   NEUTROABS 4.4 07/11/2023    ASSESSMENT & PLAN:  Malignant neoplasm of upper-outer quadrant of right breast in female, estrogen receptor positive (HCC) 12/13/19: Right lumpectomy (Tsuei): invasive lobular carcinoma, grade 2, 2.4cm, clear margins.  ER 80%, PR 50%, HER-2 negative, Ki-67 5%   Treatment plan: Anastrozole  adjuvant therapy 1 mg daily x5 years started September 2021 Anastrozole  toxicities: Hot flashes have resolved. Otherwise tolerating it extremely  well.    Breast cancer surveillance: Mammogram 12/21/2022.  Right breast possible asymmetry/architectural distortion, diagnostic mammogram and targeted ultrasound 0.5 cm (benign cysts) Breast exam 07/18/2023: Benign   She likes to travel around with her church  group to hear [place] speakers. Chronic peripheral neuropathy: Improving.  She is now using a walker and wheelchair to get around.   Given her age and comorbidities, I recommended discontinuation of anastrozole  therapy.  We will call [facility] and have them stop automatic refills. She can be seen back by us  on an as-needed basis.  No orders of the defined types were placed in this encounter.  The patient has a good understanding of the overall plan. she agrees with it. she will call with any problems that may develop before the next visit here.  I personally spent a total of 30 minutes in the care of the patient today including preparing to see the patient, getting/reviewing separately obtained history, performing a medically appropriate exam/evaluation, counseling and educating, placing orders, referring and communicating with other health care professionals, documenting clinical information in the EHR, independently interpreting results, communicating results, and coordinating care.   Viinay K Syrita Dovel, MD 03/26/24

## 2024-04-10 ENCOUNTER — Ambulatory Visit

## 2024-04-15 ENCOUNTER — Encounter: Payer: Self-pay | Admitting: Cardiovascular Disease

## 2024-04-15 ENCOUNTER — Encounter: Payer: Self-pay | Admitting: Urgent Care

## 2024-04-15 DIAGNOSIS — G2581 Restless legs syndrome: Secondary | ICD-10-CM

## 2024-04-16 MED ORDER — PRAMIPEXOLE DIHYDROCHLORIDE 0.125 MG PO TABS: 0.1250 mg | ORAL_TABLET | Freq: Every evening | ORAL | 1 refills | Status: AC

## 2024-04-24 ENCOUNTER — Ambulatory Visit: Attending: Cardiovascular Disease | Admitting: *Deleted

## 2024-04-24 DIAGNOSIS — Z7901 Long term (current) use of anticoagulants: Secondary | ICD-10-CM | POA: Diagnosis not present

## 2024-04-24 DIAGNOSIS — I482 Chronic atrial fibrillation, unspecified: Secondary | ICD-10-CM

## 2024-04-24 LAB — POCT INR: INR: 2.1 (ref 2.0–3.0)

## 2024-04-24 NOTE — Progress Notes (Signed)
 Description   INR-2.1; Continue taking Warfarin 1 tablet daily except 1.5 tablets Mondays, Wednesdays and Fridays.   Repeat INR in 5 weeks.  Call [phone] with any questions

## 2024-04-24 NOTE — Patient Instructions (Signed)
 Description   INR-2.1; Continue taking Warfarin 1 tablet daily except 1.5 tablets Mondays, Wednesdays and Fridays.   Repeat INR in 5 weeks.  Call [phone] with any questions

## 2024-05-14 ENCOUNTER — Other Ambulatory Visit: Payer: Self-pay | Admitting: Nurse Practitioner

## 2024-05-14 ENCOUNTER — Other Ambulatory Visit: Payer: Self-pay

## 2024-05-14 ENCOUNTER — Encounter: Payer: Self-pay | Admitting: Cardiovascular Disease

## 2024-05-14 MED ORDER — WARFARIN SODIUM 3 MG PO TABS: ORAL_TABLET | ORAL | 1 refills | Status: DC

## 2024-05-16 ENCOUNTER — Telehealth: Payer: Self-pay | Admitting: Cardiovascular Disease

## 2024-05-16 NOTE — Telephone Encounter (Signed)
" °*  STAT* If patient is at the pharmacy, call can be transferred to refill team.   1. Which medications need to be refilled? (please list name of each medication and dose if known)   warfarin (COUMADIN ) 3 MG tablet     2. Would you like to learn more about the convenience, safety, & potential cost savings by using the [facility]?  No   3. Are you open to using the [facility] (Type [facility]. ). No    4. Which pharmacy/location (including street and city if local pharmacy) is medication to be sent to?[id number] - [place], [place] - [address] AT [facility] [address]    5. Do they need a 30 day or 90 day supply? 90  "

## 2024-05-22 ENCOUNTER — Ambulatory Visit

## 2024-05-22 DIAGNOSIS — I495 Sick sinus syndrome: Secondary | ICD-10-CM | POA: Diagnosis not present

## 2024-05-23 ENCOUNTER — Other Ambulatory Visit: Payer: Self-pay

## 2024-05-23 LAB — CUP PACEART REMOTE DEVICE CHECK
Battery Remaining Longevity: 130 mo
Battery Voltage: 3.03 V
Brady Statistic AP VP Percent: 0 %
Brady Statistic AP VS Percent: 0 %
Brady Statistic AS VP Percent: 61.68 %
Brady Statistic AS VS Percent: 38.32 %
Brady Statistic RA Percent Paced: 0 %
Brady Statistic RV Percent Paced: 61.68 %
Date Time Interrogation Session: 20260119211630
Implantable Lead Connection Status: 753985
Implantable Lead Connection Status: 753985
Implantable Lead Implant Date: 20110428
Implantable Lead Implant Date: 20110428
Implantable Lead Location: 753859
Implantable Lead Location: 753860
Implantable Lead Model: 4092
Implantable Lead Model: 4592
Implantable Pulse Generator Implant Date: 20230424
Lead Channel Impedance Value: 456 Ohm
Lead Channel Impedance Value: 494 Ohm
Lead Channel Impedance Value: 570 Ohm
Lead Channel Impedance Value: 589 Ohm
Lead Channel Pacing Threshold Amplitude: 1 V
Lead Channel Pacing Threshold Pulse Width: 0.4 ms
Lead Channel Sensing Intrinsic Amplitude: 1.375 mV
Lead Channel Sensing Intrinsic Amplitude: 13.875 mV
Lead Channel Sensing Intrinsic Amplitude: 13.875 mV
Lead Channel Setting Pacing Amplitude: 2 V
Lead Channel Setting Pacing Pulse Width: 0.4 ms
Lead Channel Setting Sensing Sensitivity: 1.2 mV
Zone Setting Status: 755011

## 2024-05-23 MED ORDER — WARFARIN SODIUM 3 MG PO TABS: ORAL_TABLET | ORAL | 1 refills | Status: AC

## 2024-05-25 NOTE — Progress Notes (Signed)
 Remote PPM Transmission

## 2024-05-27 ENCOUNTER — Ambulatory Visit: Payer: Self-pay | Admitting: Cardiovascular Disease

## 2024-05-29 ENCOUNTER — Ambulatory Visit

## 2024-06-05 ENCOUNTER — Ambulatory Visit

## 2024-06-05 ENCOUNTER — Other Ambulatory Visit: Payer: Self-pay

## 2024-06-05 DIAGNOSIS — Z7901 Long term (current) use of anticoagulants: Secondary | ICD-10-CM | POA: Diagnosis not present

## 2024-06-05 DIAGNOSIS — I482 Chronic atrial fibrillation, unspecified: Secondary | ICD-10-CM | POA: Diagnosis not present

## 2024-06-05 LAB — POCT INR: INR: 2 (ref 2.0–3.0)

## 2024-06-05 MED ORDER — FUROSEMIDE 40 MG PO TABS: 40.0000 mg | ORAL_TABLET | Freq: Every day | ORAL | 2 refills | Status: AC

## 2024-06-05 NOTE — Patient Instructions (Signed)
 Description   INR-2.0; Continue taking Warfarin 1 tablet daily except 1.5 tablets Mondays, Wednesdays and Fridays.   Repeat INR in 6 weeks.  Call [phone] with any questions

## 2024-06-05 NOTE — Progress Notes (Signed)
 Description   INR-2.0; Continue taking Warfarin 1 tablet daily except 1.5 tablets Mondays, Wednesdays and Fridays.   Repeat INR in 6 weeks.  Call [phone] with any questions

## 2024-06-20 ENCOUNTER — Ambulatory Visit: Admitting: Urgent Care

## 2024-07-17 ENCOUNTER — Ambulatory Visit

## 2024-08-21 ENCOUNTER — Ambulatory Visit

## 2024-11-20 ENCOUNTER — Ambulatory Visit

## 2025-02-19 ENCOUNTER — Ambulatory Visit

## 2025-05-21 ENCOUNTER — Ambulatory Visit
